# Patient Record
Sex: Female | Born: 1955
Health system: Southern US, Community
[De-identification: ages and names within clinical notes are randomized; demographics above are authoritative.]

## PROBLEM LIST (undated history)

## (undated) DIAGNOSIS — F419 Anxiety disorder, unspecified: Secondary | ICD-10-CM

## (undated) DIAGNOSIS — Z923 Personal history of irradiation: Secondary | ICD-10-CM

## (undated) DIAGNOSIS — IMO0001 Reserved for inherently not codable concepts without codable children: Secondary | ICD-10-CM

## (undated) DIAGNOSIS — Z9221 Personal history of antineoplastic chemotherapy: Secondary | ICD-10-CM

## (undated) DIAGNOSIS — I10 Essential (primary) hypertension: Secondary | ICD-10-CM

## (undated) DIAGNOSIS — R0602 Shortness of breath: Secondary | ICD-10-CM

## (undated) DIAGNOSIS — IMO0002 Reserved for concepts with insufficient information to code with codable children: Secondary | ICD-10-CM

## (undated) DIAGNOSIS — C801 Malignant (primary) neoplasm, unspecified: Secondary | ICD-10-CM

## (undated) DIAGNOSIS — C50919 Malignant neoplasm of unspecified site of unspecified female breast: Secondary | ICD-10-CM

## (undated) DIAGNOSIS — I2699 Other pulmonary embolism without acute cor pulmonale: Secondary | ICD-10-CM

## (undated) DIAGNOSIS — K769 Liver disease, unspecified: Secondary | ICD-10-CM

## (undated) HISTORY — PX: BREAST SURGERY: SHX581

## (undated) HISTORY — DX: Essential (primary) hypertension: I10

## (undated) HISTORY — PX: CHOLECYSTECTOMY: SHX55

## (undated) HISTORY — DX: Reserved for inherently not codable concepts without codable children: IMO0001

## (undated) HISTORY — DX: Reserved for concepts with insufficient information to code with codable children: IMO0002

---

## 1999-02-23 ENCOUNTER — Encounter: Payer: Self-pay | Admitting: Emergency Medicine

## 1999-02-23 ENCOUNTER — Emergency Department (HOSPITAL_COMMUNITY): Admission: EM | Admit: 1999-02-23 | Discharge: 1999-02-23 | Payer: Self-pay | Admitting: Emergency Medicine

## 2003-10-24 ENCOUNTER — Other Ambulatory Visit: Admission: RE | Admit: 2003-10-24 | Discharge: 2003-10-24 | Payer: Self-pay | Admitting: Family Medicine

## 2003-10-24 ENCOUNTER — Encounter: Admission: RE | Admit: 2003-10-24 | Discharge: 2003-10-24 | Payer: Self-pay | Admitting: Family Medicine

## 2003-10-31 ENCOUNTER — Encounter: Admission: RE | Admit: 2003-10-31 | Discharge: 2003-10-31 | Payer: Self-pay | Admitting: Family Medicine

## 2003-11-16 ENCOUNTER — Encounter: Admission: RE | Admit: 2003-11-16 | Discharge: 2003-11-16 | Payer: Self-pay | Admitting: Family Medicine

## 2003-12-01 ENCOUNTER — Encounter: Admission: RE | Admit: 2003-12-01 | Discharge: 2003-12-01 | Payer: Self-pay | Admitting: Sports Medicine

## 2003-12-04 ENCOUNTER — Encounter: Admission: RE | Admit: 2003-12-04 | Discharge: 2003-12-04 | Payer: Self-pay | Admitting: Family Medicine

## 2003-12-07 ENCOUNTER — Encounter (INDEPENDENT_AMBULATORY_CARE_PROVIDER_SITE_OTHER): Payer: Self-pay | Admitting: *Deleted

## 2003-12-07 ENCOUNTER — Encounter: Admission: RE | Admit: 2003-12-07 | Discharge: 2003-12-07 | Payer: Self-pay | Admitting: Sports Medicine

## 2004-01-02 ENCOUNTER — Encounter: Admission: RE | Admit: 2004-01-02 | Discharge: 2004-01-02 | Payer: Self-pay | Admitting: Family Medicine

## 2004-08-20 ENCOUNTER — Emergency Department (HOSPITAL_COMMUNITY): Admission: EM | Admit: 2004-08-20 | Discharge: 2004-08-20 | Payer: Self-pay | Admitting: Emergency Medicine

## 2004-09-15 HISTORY — PX: BREAST LUMPECTOMY: SHX2

## 2005-05-02 ENCOUNTER — Encounter: Admission: RE | Admit: 2005-05-02 | Discharge: 2005-05-02 | Payer: Self-pay | Admitting: Sports Medicine

## 2005-05-02 ENCOUNTER — Encounter (INDEPENDENT_AMBULATORY_CARE_PROVIDER_SITE_OTHER): Payer: Self-pay | Admitting: *Deleted

## 2005-05-16 ENCOUNTER — Encounter: Admission: RE | Admit: 2005-05-16 | Discharge: 2005-05-16 | Payer: Self-pay | Admitting: Sports Medicine

## 2005-05-22 ENCOUNTER — Encounter: Admission: RE | Admit: 2005-05-22 | Discharge: 2005-05-22 | Payer: Self-pay | Admitting: Sports Medicine

## 2005-05-22 ENCOUNTER — Encounter (INDEPENDENT_AMBULATORY_CARE_PROVIDER_SITE_OTHER): Payer: Self-pay | Admitting: *Deleted

## 2005-06-02 ENCOUNTER — Encounter: Admission: RE | Admit: 2005-06-02 | Discharge: 2005-06-02 | Payer: Self-pay | Admitting: Surgery

## 2005-06-05 ENCOUNTER — Encounter (INDEPENDENT_AMBULATORY_CARE_PROVIDER_SITE_OTHER): Payer: Self-pay | Admitting: Surgery

## 2005-06-05 ENCOUNTER — Ambulatory Visit (HOSPITAL_BASED_OUTPATIENT_CLINIC_OR_DEPARTMENT_OTHER): Admission: RE | Admit: 2005-06-05 | Discharge: 2005-06-05 | Payer: Self-pay | Admitting: Surgery

## 2005-06-05 ENCOUNTER — Ambulatory Visit (HOSPITAL_COMMUNITY): Admission: RE | Admit: 2005-06-05 | Discharge: 2005-06-05 | Payer: Self-pay | Admitting: Surgery

## 2005-06-05 ENCOUNTER — Encounter: Admission: RE | Admit: 2005-06-05 | Discharge: 2005-06-05 | Payer: Self-pay | Admitting: Surgery

## 2005-06-05 ENCOUNTER — Encounter (INDEPENDENT_AMBULATORY_CARE_PROVIDER_SITE_OTHER): Payer: Self-pay | Admitting: *Deleted

## 2005-06-25 ENCOUNTER — Ambulatory Visit: Payer: Self-pay | Admitting: Oncology

## 2005-06-27 ENCOUNTER — Ambulatory Visit: Admission: RE | Admit: 2005-06-27 | Discharge: 2005-07-21 | Payer: Self-pay | Admitting: *Deleted

## 2005-08-12 ENCOUNTER — Ambulatory Visit: Payer: Self-pay | Admitting: Oncology

## 2005-08-14 ENCOUNTER — Ambulatory Visit (HOSPITAL_COMMUNITY): Admission: RE | Admit: 2005-08-14 | Discharge: 2005-08-14 | Payer: Self-pay | Admitting: Surgery

## 2005-08-14 ENCOUNTER — Ambulatory Visit (HOSPITAL_BASED_OUTPATIENT_CLINIC_OR_DEPARTMENT_OTHER): Admission: RE | Admit: 2005-08-14 | Discharge: 2005-08-14 | Payer: Self-pay | Admitting: Surgery

## 2005-08-15 ENCOUNTER — Encounter (INDEPENDENT_AMBULATORY_CARE_PROVIDER_SITE_OTHER): Payer: Self-pay | Admitting: Specialist

## 2008-09-15 HISTORY — PX: BREAST LUMPECTOMY: SHX2

## 2008-09-15 HISTORY — PX: BREAST BIOPSY: SHX20

## 2009-07-19 ENCOUNTER — Emergency Department (HOSPITAL_COMMUNITY): Admission: EM | Admit: 2009-07-19 | Discharge: 2009-07-19 | Payer: Self-pay | Admitting: Emergency Medicine

## 2009-08-14 ENCOUNTER — Encounter: Admission: RE | Admit: 2009-08-14 | Discharge: 2009-08-14 | Payer: Self-pay | Admitting: Infectious Diseases

## 2009-08-15 ENCOUNTER — Encounter: Admission: RE | Admit: 2009-08-15 | Discharge: 2009-08-15 | Payer: Self-pay | Admitting: Infectious Diseases

## 2009-08-21 ENCOUNTER — Encounter: Admission: RE | Admit: 2009-08-21 | Discharge: 2009-08-21 | Payer: Self-pay | Admitting: Infectious Diseases

## 2009-08-27 ENCOUNTER — Ambulatory Visit: Payer: Self-pay | Admitting: Oncology

## 2009-09-04 ENCOUNTER — Ambulatory Visit: Admission: RE | Admit: 2009-09-04 | Discharge: 2009-09-14 | Payer: Self-pay | Admitting: Radiation Oncology

## 2009-09-16 ENCOUNTER — Ambulatory Visit: Admission: RE | Admit: 2009-09-16 | Discharge: 2009-11-28 | Payer: Self-pay | Admitting: Radiation Oncology

## 2009-10-26 ENCOUNTER — Ambulatory Visit (HOSPITAL_COMMUNITY): Admission: RE | Admit: 2009-10-26 | Discharge: 2009-10-26 | Payer: Self-pay | Admitting: Surgery

## 2009-11-04 ENCOUNTER — Emergency Department (HOSPITAL_COMMUNITY): Admission: EM | Admit: 2009-11-04 | Discharge: 2009-11-04 | Payer: Self-pay | Admitting: Emergency Medicine

## 2009-12-19 ENCOUNTER — Ambulatory Visit (HOSPITAL_COMMUNITY): Admission: RE | Admit: 2009-12-19 | Discharge: 2009-12-19 | Payer: Self-pay | Admitting: Surgery

## 2009-12-26 ENCOUNTER — Ambulatory Visit: Admission: RE | Admit: 2009-12-26 | Discharge: 2010-03-12 | Payer: Self-pay | Admitting: Radiation Oncology

## 2010-01-01 ENCOUNTER — Ambulatory Visit: Payer: Self-pay | Admitting: Oncology

## 2010-01-03 LAB — CBC WITH DIFFERENTIAL/PLATELET
Basophils Absolute: 0 10*3/uL (ref 0.0–0.1)
HCT: 41.1 % (ref 34.8–46.6)
HGB: 13.8 g/dL (ref 11.6–15.9)
MCH: 33 pg (ref 25.1–34.0)
MCHC: 33.6 g/dL (ref 31.5–36.0)
MCV: 98.4 fL (ref 79.5–101.0)
MONO%: 4.8 % (ref 0.0–14.0)
NEUT#: 6.6 10*3/uL — ABNORMAL HIGH (ref 1.5–6.5)
Platelets: 306 10*3/uL (ref 145–400)
RBC: 4.18 10*6/uL (ref 3.70–5.45)
WBC: 10.8 10*3/uL — ABNORMAL HIGH (ref 3.9–10.3)
lymph#: 3.4 10*3/uL — ABNORMAL HIGH (ref 0.9–3.3)

## 2010-01-04 LAB — COMPREHENSIVE METABOLIC PANEL
ALT: 59 U/L — ABNORMAL HIGH (ref 0–35)
Albumin: 3.9 g/dL (ref 3.5–5.2)
Alkaline Phosphatase: 39 U/L (ref 39–117)
BUN: 11 mg/dL (ref 6–23)
CO2: 30 mEq/L (ref 19–32)
Chloride: 101 mEq/L (ref 96–112)
Creatinine, Ser: 0.85 mg/dL (ref 0.40–1.20)
Glucose, Bld: 101 mg/dL — ABNORMAL HIGH (ref 70–99)
Sodium: 138 mEq/L (ref 135–145)
Total Bilirubin: 0.5 mg/dL (ref 0.3–1.2)

## 2010-01-04 LAB — LACTATE DEHYDROGENASE: LDH: 147 U/L (ref 94–250)

## 2010-01-04 LAB — FOLLICLE STIMULATING HORMONE: FSH: 37.6 m[IU]/mL

## 2010-01-14 LAB — ESTRADIOL, ULTRA SENS: Estradiol, Ultra Sensitive: 3 pg/mL

## 2010-03-20 ENCOUNTER — Ambulatory Visit: Payer: Self-pay | Admitting: Oncology

## 2010-08-13 ENCOUNTER — Ambulatory Visit: Payer: Self-pay | Admitting: Oncology

## 2010-08-15 ENCOUNTER — Encounter
Admission: RE | Admit: 2010-08-15 | Discharge: 2010-08-15 | Payer: Self-pay | Source: Home / Self Care | Admitting: Oncology

## 2010-08-15 LAB — CBC WITH DIFFERENTIAL/PLATELET
BASO%: 0.6 % (ref 0.0–2.0)
Basophils Absolute: 0.1 10*3/uL (ref 0.0–0.1)
EOS%: 0.7 % (ref 0.0–7.0)
HGB: 14.1 g/dL (ref 11.6–15.9)
MCH: 32.6 pg (ref 25.1–34.0)
MCHC: 34.7 g/dL (ref 31.5–36.0)
RBC: 4.33 10*6/uL (ref 3.70–5.45)
RDW: 13.8 % (ref 11.2–14.5)
lymph#: 3.5 10*3/uL — ABNORMAL HIGH (ref 0.9–3.3)

## 2010-08-15 LAB — COMPREHENSIVE METABOLIC PANEL
ALT: 78 U/L — ABNORMAL HIGH (ref 0–35)
AST: 117 U/L — ABNORMAL HIGH (ref 0–37)
Albumin: 3.5 g/dL (ref 3.5–5.2)
Alkaline Phosphatase: 35 U/L — ABNORMAL LOW (ref 39–117)
Calcium: 9.7 mg/dL (ref 8.4–10.5)
Chloride: 101 mEq/L (ref 96–112)
Potassium: 3.8 mEq/L (ref 3.5–5.3)
Sodium: 141 mEq/L (ref 135–145)

## 2010-09-19 ENCOUNTER — Ambulatory Visit: Payer: Self-pay | Admitting: Oncology

## 2010-10-06 ENCOUNTER — Encounter: Payer: Self-pay | Admitting: Oncology

## 2010-10-11 ENCOUNTER — Ambulatory Visit (HOSPITAL_COMMUNITY)
Admission: RE | Admit: 2010-10-11 | Discharge: 2010-10-11 | Payer: Self-pay | Source: Home / Self Care | Attending: Oncology | Admitting: Oncology

## 2010-10-15 ENCOUNTER — Ambulatory Visit (HOSPITAL_COMMUNITY)
Admission: RE | Admit: 2010-10-15 | Discharge: 2010-10-15 | Payer: Self-pay | Source: Home / Self Care | Attending: Oncology | Admitting: Oncology

## 2010-10-16 ENCOUNTER — Encounter: Payer: Self-pay | Admitting: Oncology

## 2010-10-17 ENCOUNTER — Ambulatory Visit: Payer: Self-pay | Attending: Radiation Oncology | Admitting: Radiation Oncology

## 2010-10-17 ENCOUNTER — Ambulatory Visit: Payer: Self-pay | Admitting: Radiation Oncology

## 2010-12-04 LAB — BASIC METABOLIC PANEL
BUN: 7 mg/dL (ref 6–23)
BUN: 6 mg/dL (ref 6–23)
CO2: 26 mEq/L (ref 19–32)
CO2: 30 mEq/L (ref 19–32)
Calcium: 8.6 mg/dL (ref 8.4–10.5)
Chloride: 99 mEq/L (ref 96–112)
Creatinine, Ser: 0.85 mg/dL (ref 0.4–1.2)
Creatinine, Ser: 0.94 mg/dL (ref 0.4–1.2)
GFR calc Af Amer: >60 mL/min (ref >60)
Potassium: 3.3 mEq/L — ABNORMAL LOW (ref 3.5–5.1)

## 2010-12-04 LAB — CBC
HCT: 40.9 % (ref 36.0–46.0)
MCHC: 34.8 g/dL (ref 30.0–36.0)
MCV: 98.4 fL (ref 78.0–100.0)
Platelets: 310 10*3/uL (ref 150–400)
RBC: 4.15 MIL/uL (ref 3.87–5.11)

## 2010-12-06 LAB — BASIC METABOLIC PANEL
BUN: 12 mg/dL (ref 6–23)
CO2: 31 mEq/L (ref 19–32)
Calcium: 9.2 mg/dL (ref 8.4–10.5)
Chloride: 102 mEq/L (ref 96–112)
Creatinine, Ser: 0.76 mg/dL (ref 0.4–1.2)
Glucose, Bld: 105 mg/dL — ABNORMAL HIGH (ref 70–99)

## 2010-12-06 LAB — DIFFERENTIAL
Basophils Absolute: 0 10*3/uL (ref 0.0–0.1)
Basophils Relative: 0 % (ref 0–1)
Eosinophils Absolute: 0 10*3/uL (ref 0.0–0.7)
Monocytes Relative: 6 % (ref 3–12)
Neutro Abs: 7.3 10*3/uL (ref 1.7–7.7)
Neutrophils Relative %: 66 % (ref 43–77)

## 2010-12-06 LAB — URINE MICROSCOPIC-ADD ON

## 2010-12-06 LAB — URINALYSIS, ROUTINE W REFLEX MICROSCOPIC
Glucose, UA: NEGATIVE mg/dL
Ketones, ur: NEGATIVE mg/dL
Protein, ur: NEGATIVE mg/dL
Urobilinogen, UA: 0.2 mg/dL (ref 0.0–1.0)

## 2010-12-06 LAB — RAPID URINE DRUG SCREEN, HOSP PERFORMED
Amphetamines: NOT DETECTED
Barbiturates: NOT DETECTED
Benzodiazepines: NOT DETECTED
Cocaine: NOT DETECTED
Opiates: NOT DETECTED

## 2010-12-06 LAB — CBC
MCHC: 34.4 g/dL (ref 30.0–36.0)
MCV: 97.2 fL (ref 78.0–100.0)
RBC: 4.1 MIL/uL (ref 3.87–5.11)
RDW: 15.1 % (ref 11.5–15.5)

## 2010-12-18 LAB — POCT I-STAT, CHEM 8
Creatinine, Ser: 0.8 mg/dL (ref 0.4–1.2)
Glucose, Bld: 78 mg/dL (ref 70–99)
HCT: 45 % (ref 36.0–46.0)
Hemoglobin: 15.3 g/dL — ABNORMAL HIGH (ref 12.0–15.0)
Potassium: 3.3 mEq/L — ABNORMAL LOW (ref 3.5–5.1)
Sodium: 141 mEq/L (ref 135–145)
TCO2: 23 mmol/L (ref 0–100)

## 2011-01-31 NOTE — Op Note (Signed)
NAME:  Veronica Reyes, Veronica Reyes    ACCOUNT NO.:  [id number]   MEDICAL RECORD NO.:  [id number]          PATIENT TYPE:  AMB   LOCATION:  DSC                          FACILITY:  [facility]   PHYSICIAN:  Currie Paris, M.D.DATE OF BIRTH:  03/08/1956   DATE OF PROCEDURE:  08/14/2005  DATE OF DISCHARGE:                                 OPERATIVE REPORT   [id number].   PREOPERATIVE DIAGNOSIS:  Ductal carcinoma in situ, left breast, with close  margin.   POSTOPERATIVE DIAGNOSIS:  Ductal carcinoma in situ, left breast, with close  margin.   OPERATION:  Re-excision of left lumpectomy site superior medial deep margin.   SURGEON:  Currie Paris, M.D.   ANESTHESIA:  General.   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  For the deeper, more  medially-located one I had taken some extra margin and a small amount of  DCIS outside of these two more well-circumscribed masses was found.  The  patient has very large breasts and we discussed mastectomy, but that would  make her very asymmetric and she was not in a position to see about having a  reduction done on the contralateral side.  We were also concerned about side  effects of radiation should we simply radiate this area, and it was elected  to go back and re-excise the margin and see if we could get a better margin.  The process of making these decisions in and getting evaluations has taken  about two months.  She had initially healing well but had presented  apparently with a late-developing wound hematoma and had that drained in the  office but had not appeared to be infected.  At this point we decided bring  her back to the operating room and try to re-excise her margin.   DESCRIPTION OF PROCEDURE:  The patient was seen in the holding area and had  no further questions.  The left breast was identified and marked as the  operative side by the patient  myself.  As noted, there was a drain present.   The patient was taken to the operating room and after satisfactory general  anesthesia had been obtained, the drain was removed and the left breast was  prepped and draped.  The old scar was opened and extended a little bit  medially since the lumpectomy had really tracked from the incision more  medially than was apparent.  The lumpectomy cavity was fairly indurated  around it and I think there is a lot of inflammatory process, but there was  nothing that looked infected or purulent.  There was just some residual old  hematoma-looking material in the cavity itself.   Using cutting current of the cautery, I re-excised the deep, the medial and  the medial half of the superior margins, which were the area where I had  previously excised some extra margin.  This was all sent to pathology.  I  spent several minutes irrigating and making sure everything was completely  dry.  The skin was fairly edematous from her inflammatory process and  reaction to  the prior surgery and I elected to place another drain back in,  so this was done  through a new stab wound using a 19 Blake and securing that with a 2-0  nylon.  Once everything appeared to be dry, I only had just simply closed  the skin with some staples.   The patient tolerated the procedure well. There were no complications, and  all counts were correct.      Currie Paris, M.D.  Electronically Signed     CJS/MEDQ  D:  08/14/2005  T:  08/15/2005  Job:  [id number]   cc:   Elmer Sow. Dorna Bloom, M.D.  Fax: [phone]

## 2011-01-31 NOTE — Op Note (Signed)
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   OPERATION:  Needle-guided excision of 2 breast masses, left breast.   SURGEON:  Currie Paris, M.D.   ANESTHESIA:  General.   CLINICAL HISTORY:  This lady had a palpable mass and the left breast and a  core biopsy showed what was worrisome for some intracystic DCIS.  In further  evaluation, a second, deeper, more medial mass was found and this was  biopsied and shown to be a papilloma.  It was felt wide excision of both  these masses was appropriate and since they were close together, I thought  we could do this under a single incision.   DESCRIPTION OF PROCEDURE:  The patient was seen in the holding area.  We had  localized both lesions with guidewires to be sure that we were well around  everything and especially since the more palpable lesion had become less  readily noticeable after her biopsy.  It had been filled with some fluid  which diminish to its apparent size.   After the films were reviewed, we confirmed with the patient the plans for  the excisional biopsies on the left side and identified and marked that  side.   The patient was then taken to the operating room and after satisfactory  general anesthesia had been obtained, the left breast was prepped and  draped.  The radiologist placed the guidewires  laterally and they both  tracked medially and were fairly adjacent to each other.  There were 2  X's  on the skin to mark the abnormalities, one just below the other.   I made a curvilinear incision directly between the 2 guidewires and went  fairly far medial, since that was the way the guidewires tracked.  I divided  a little fatty tissue and then using cautery, took a wide excision around  both guidewires, going first superior and since I could palpate the one  lesion, I made sure I was well above it and went down below it and then  worked around medially.  I then worked somewhat inferiorly to free that up  and then worked from lateral to medial.  When I was well-medial to the mark,  I tried to divide down through the tissue and then I encountered the second  mass, which was a soft papillomatous-looking lesion. I went ahead and took  more tissue right at this point to get around it completely and then  completed the lumpectomy.   Prior to sending the specimen, I put some markers on it  to orient it for the  pathologist.  I also closed the area where I had gotten into the papilloma  to make the true margins a little bit more apparent.   At this point I went back and made sure everything was dry.  I then took  additional tissue from the medial superior deep area, which is where the  papilloma had been located and where I had gotten into it to make sure I was  well around it.  There was also little bit of soft-looking breast tissue  mixed in with fatty that I took out to be sure that this was not the edge of  the papilloma, making sure that I had one side that was well beyond and the  grossly looked abnormal.  Again, I spent several minutes making sure  everything was dry and once I thought we had good hemostasis, I irrigated  and then closed with some 3-0 Vicryl interrupted followed by for 4-0  Monocryl and Dermabond.   Pathologist reported that both lesions were contained within the  specimen  mammogram.  This concluded the case.  The patient was awakened and taken to  the recovery room in satisfactory condition.  There no operative  complications.  All counts were correct.      Currie Paris, M.D.  Electronically Signed     CJS/MEDQ  D:  06/05/2005  T:  06/06/2005  Job:  [id number]

## 2011-04-03 ENCOUNTER — Other Ambulatory Visit: Payer: Self-pay | Admitting: Oncology

## 2011-04-03 ENCOUNTER — Encounter (HOSPITAL_BASED_OUTPATIENT_CLINIC_OR_DEPARTMENT_OTHER): Payer: Self-pay | Admitting: Oncology

## 2011-04-03 DIAGNOSIS — C50419 Malignant neoplasm of upper-outer quadrant of unspecified female breast: Secondary | ICD-10-CM

## 2011-04-03 LAB — CBC WITH DIFFERENTIAL/PLATELET
EOS%: 1.2 % (ref 0.0–7.0)
MCH: 32.2 pg (ref 25.1–34.0)
MCV: 95.2 fL (ref 79.5–101.0)
MONO%: 7.5 % (ref 0.0–14.0)
NEUT#: 4.5 10*3/uL (ref 1.5–6.5)
RBC: 4.14 10*6/uL (ref 3.70–5.45)
RDW: 14.2 % (ref 11.2–14.5)
lymph#: 3.6 10*3/uL — ABNORMAL HIGH (ref 0.9–3.3)

## 2011-04-03 LAB — COMPREHENSIVE METABOLIC PANEL
ALT: 54 U/L — ABNORMAL HIGH (ref 0–35)
AST: 67 U/L — ABNORMAL HIGH (ref 0–37)
Albumin: 3.1 g/dL — ABNORMAL LOW (ref 3.5–5.2)
Alkaline Phosphatase: 34 U/L — ABNORMAL LOW (ref 39–117)
Calcium: 9.9 mg/dL (ref 8.4–10.5)
Chloride: 103 mEq/L (ref 96–112)
Potassium: 3.7 mEq/L (ref 3.5–5.3)
Sodium: 139 mEq/L (ref 135–145)
Total Protein: 7.9 g/dL (ref 6.0–8.3)

## 2011-04-04 ENCOUNTER — Other Ambulatory Visit: Payer: Self-pay | Admitting: Oncology

## 2011-04-04 DIAGNOSIS — Z9889 Other specified postprocedural states: Secondary | ICD-10-CM

## 2011-09-29 ENCOUNTER — Other Ambulatory Visit: Payer: Self-pay | Admitting: Obstetrics and Gynecology

## 2011-09-29 DIAGNOSIS — Z1231 Encounter for screening mammogram for malignant neoplasm of breast: Secondary | ICD-10-CM

## 2011-09-30 ENCOUNTER — Ambulatory Visit (HOSPITAL_COMMUNITY): Payer: Self-pay | Attending: Obstetrics and Gynecology

## 2011-09-30 ENCOUNTER — Other Ambulatory Visit: Payer: Self-pay | Admitting: Obstetrics and Gynecology

## 2011-09-30 ENCOUNTER — Ambulatory Visit (INDEPENDENT_AMBULATORY_CARE_PROVIDER_SITE_OTHER): Payer: Self-pay | Admitting: *Deleted

## 2011-09-30 VITALS — BP 183/111 | HR 73 | Temp 99.5°F | Resp 16 | Ht 66.0 in | Wt 307.0 lb

## 2011-09-30 DIAGNOSIS — Z9889 Other specified postprocedural states: Secondary | ICD-10-CM

## 2011-09-30 DIAGNOSIS — Z853 Personal history of malignant neoplasm of breast: Secondary | ICD-10-CM

## 2011-09-30 DIAGNOSIS — Z01419 Encounter for gynecological examination (general) (routine) without abnormal findings: Secondary | ICD-10-CM

## 2011-09-30 NOTE — Patient Instructions (Signed)
Taught patient how to perform BSE and gave educational materials to take home. Let her know [facility] will cover Pap smears every 3 years unless has a history of abnormal Pap smears. Patient is scheduled for a diagnostic mammogram at the [facility] for Thursday, October 09, 2011 at 0900. Patient aware of appointment and if unable to make it will call and reschedule. Patient given phone number to the [facility]. Talked with patient about her high blood pressure. Patient does have a PCP who follows her. Patient stated she will call PCP and schedule a follow up appointment. Patient stated blood pressure has been normal lately when had it taken until today. Let patient know will follow up with her within the next couple weeks with results by letter or phone. Patient verbalized understanding.

## 2011-09-30 NOTE — Progress Notes (Signed)
No complaints today.  Pap Smear:    Completed Pap smear today. Last Pap smear was in 2010 through [facility] at the [facility] and normal per patient. No history of abnormal Pap smears per patient.  No Pap smear results in EPIC.  Physical exam: Breasts Breasts symmetrical. Two scars on left upper breast where patient has had 2 lumpectomy's related to breast cancer in 2005 and 2010. Under bilateral breasts skin is red and yeast like appearing. Patient stated sweats a lot under breast and has been given a prescription to help protect her skin. No nipple retraction bilateral breasts. No nipple discharge bilateral breasts. No lymphadenopathy. No lumps palpated bilateral breasts. No complaints of pain or tenderness bilateral breasts. Per the [facility] recommendation. Referred patient to the [facility] for follow up diagnostic mammogram. Patient's appointment is scheduled for Thursday, October 09, 2011 at 0900.         Pelvic/Bimanual   Ext Genitalia No lesions, no swelling and no discharge observed on external genitalia.         Vagina Vagina pink and normal texture. No lesions or discharge observed in vagina.          Cervix Cervix is present. Cervix pink and of normal texture. No discharge observed at cervical os.         Uterus Uterus is present and palpable. Uterus in normal position and normal size.       Adnexae Bilateral ovaries present and not palpable. No tenderness on palpation.        Rectovaginal No rectal exam completed today since patient had no rectal complaints. No skin abnormalities observed on rectal area.

## 2011-10-15 ENCOUNTER — Ambulatory Visit
Admission: RE | Admit: 2011-10-15 | Discharge: 2011-10-15 | Disposition: A | Discharge status: Home / Self Care | Payer: No Typology Code available for payment source | Source: Ambulatory Visit | Attending: Obstetrics and Gynecology | Admitting: Obstetrics and Gynecology

## 2011-10-15 DIAGNOSIS — Z853 Personal history of malignant neoplasm of breast: Secondary | ICD-10-CM

## 2011-10-15 DIAGNOSIS — Z9889 Other specified postprocedural states: Secondary | ICD-10-CM

## 2011-10-21 ENCOUNTER — Other Ambulatory Visit: Payer: Self-pay

## 2011-10-21 DIAGNOSIS — C50419 Malignant neoplasm of upper-outer quadrant of unspecified female breast: Secondary | ICD-10-CM

## 2011-10-22 ENCOUNTER — Other Ambulatory Visit: Payer: Self-pay | Admitting: Lab

## 2011-10-22 ENCOUNTER — Ambulatory Visit: Payer: Self-pay | Admitting: Oncology

## 2011-10-23 ENCOUNTER — Other Ambulatory Visit: Payer: Self-pay

## 2011-10-24 ENCOUNTER — Telehealth: Payer: Self-pay | Admitting: *Deleted

## 2011-10-24 NOTE — Telephone Encounter (Signed)
Attempted to call patient to follow up on mammogram and Pap smear results. No one answered phone. Left voicemail for patient to call me back.

## 2011-11-06 ENCOUNTER — Telehealth: Payer: Self-pay | Admitting: *Deleted

## 2011-11-06 NOTE — Telephone Encounter (Signed)
Called patient and gave her Pap smear results. Let her know that her Pap smear was normal. Patient has received results from the [facility] and aware she will need a diagnostic mammogram in 1 year. Let patient know that if still eligible  for [facility] in 1 year that she can call and set up her appointment with [place]. Patient verbalized understanding.

## 2013-12-30 ENCOUNTER — Other Ambulatory Visit: Payer: Self-pay | Admitting: Nurse Practitioner

## 2013-12-30 DIAGNOSIS — Z853 Personal history of malignant neoplasm of breast: Secondary | ICD-10-CM

## 2014-01-02 ENCOUNTER — Other Ambulatory Visit: Payer: Self-pay | Admitting: Gastroenterology

## 2014-01-02 DIAGNOSIS — R933 Abnormal findings on diagnostic imaging of other parts of digestive tract: Secondary | ICD-10-CM

## 2014-01-06 ENCOUNTER — Ambulatory Visit
Admission: RE | Admit: 2014-01-06 | Discharge: 2014-01-06 | Disposition: A | Discharge status: Home / Self Care | Payer: BC Managed Care – PPO | Source: Ambulatory Visit | Attending: Gastroenterology | Admitting: Gastroenterology

## 2014-01-06 ENCOUNTER — Encounter (HOSPITAL_COMMUNITY): Payer: Self-pay | Admitting: Emergency Medicine

## 2014-01-06 ENCOUNTER — Inpatient Hospital Stay (HOSPITAL_COMMUNITY)
Admission: EM | Admit: 2014-01-06 | Discharge: 2014-01-08 | DRG: 176 | Disposition: A | Discharge status: Home / Self Care | Payer: BC Managed Care – PPO | Attending: Internal Medicine | Admitting: Internal Medicine

## 2014-01-06 DIAGNOSIS — C189 Malignant neoplasm of colon, unspecified: Secondary | ICD-10-CM | POA: Diagnosis present

## 2014-01-06 DIAGNOSIS — K639 Disease of intestine, unspecified: Secondary | ICD-10-CM | POA: Diagnosis present

## 2014-01-06 DIAGNOSIS — R933 Abnormal findings on diagnostic imaging of other parts of digestive tract: Secondary | ICD-10-CM

## 2014-01-06 DIAGNOSIS — K922 Gastrointestinal hemorrhage, unspecified: Secondary | ICD-10-CM | POA: Diagnosis present

## 2014-01-06 DIAGNOSIS — K625 Hemorrhage of anus and rectum: Secondary | ICD-10-CM | POA: Diagnosis present

## 2014-01-06 DIAGNOSIS — Z8249 Family history of ischemic heart disease and other diseases of the circulatory system: Secondary | ICD-10-CM

## 2014-01-06 DIAGNOSIS — C50919 Malignant neoplasm of unspecified site of unspecified female breast: Secondary | ICD-10-CM

## 2014-01-06 DIAGNOSIS — K7689 Other specified diseases of liver: Secondary | ICD-10-CM | POA: Diagnosis present

## 2014-01-06 DIAGNOSIS — I10 Essential (primary) hypertension: Secondary | ICD-10-CM | POA: Diagnosis present

## 2014-01-06 DIAGNOSIS — K921 Melena: Secondary | ICD-10-CM | POA: Diagnosis present

## 2014-01-06 DIAGNOSIS — I2699 Other pulmonary embolism without acute cor pulmonale: Principal | ICD-10-CM | POA: Diagnosis present

## 2014-01-06 DIAGNOSIS — K769 Liver disease, unspecified: Secondary | ICD-10-CM

## 2014-01-06 DIAGNOSIS — K6389 Other specified diseases of intestine: Secondary | ICD-10-CM

## 2014-01-06 DIAGNOSIS — Z853 Personal history of malignant neoplasm of breast: Secondary | ICD-10-CM

## 2014-01-06 DIAGNOSIS — C50511 Malignant neoplasm of lower-outer quadrant of right female breast: Secondary | ICD-10-CM | POA: Diagnosis present

## 2014-01-06 HISTORY — DX: Malignant (primary) neoplasm, unspecified: C80.1

## 2014-01-06 HISTORY — DX: Malignant neoplasm of unspecified site of unspecified female breast: C50.919

## 2014-01-06 LAB — CBC WITH DIFFERENTIAL/PLATELET
BASOS ABS: 0 10*3/uL (ref 0.0–0.1)
BASOS PCT: 0 % (ref 0–1)
Eosinophils Absolute: 0.1 10*3/uL (ref 0.0–0.7)
Eosinophils Relative: 1 % (ref 0–5)
HEMATOCRIT: 39.3 % (ref 36.0–46.0)
Hemoglobin: 13.5 g/dL (ref 12.0–15.0)
Lymphocytes Relative: 35 % (ref 12–46)
Lymphs Abs: 4.2 10*3/uL — ABNORMAL HIGH (ref 0.7–4.0)
MCH: 31.4 pg (ref 26.0–34.0)
MCHC: 34.4 g/dL (ref 30.0–36.0)
MCV: 91.4 fL (ref 78.0–100.0)
MONO ABS: 0.6 10*3/uL (ref 0.1–1.0)
Monocytes Relative: 5 % (ref 3–12)
NEUTROS ABS: 6.9 10*3/uL (ref 1.7–7.7)
NEUTROS PCT: 59 % (ref 43–77)
PLATELETS: 272 10*3/uL (ref 150–400)
RBC: 4.3 MIL/uL (ref 3.87–5.11)
RDW: 14.4 % (ref 11.5–15.5)
WBC: 11.9 10*3/uL — AB (ref 4.0–10.5)

## 2014-01-06 LAB — COMPREHENSIVE METABOLIC PANEL
ALBUMIN: 3.2 g/dL — AB (ref 3.5–5.2)
ALT: 36 U/L — AB (ref 0–35)
AST: 41 U/L — AB (ref 0–37)
Alkaline Phosphatase: 43 U/L (ref 39–117)
BILIRUBIN TOTAL: 0.5 mg/dL (ref 0.3–1.2)
BUN: 7 mg/dL (ref 6–23)
CHLORIDE: 102 meq/L (ref 96–112)
CO2: 22 mEq/L (ref 19–32)
CREATININE: 0.8 mg/dL (ref 0.50–1.10)
Calcium: 9.8 mg/dL (ref 8.4–10.5)
GFR calc Af Amer: >90 mL/min (ref >90)
GFR calc non Af Amer: 81 mL/min — ABNORMAL LOW (ref >90)
Glucose, Bld: 90 mg/dL (ref 70–99)
Potassium: 3.5 mEq/L — ABNORMAL LOW (ref 3.7–5.3)
SODIUM: 139 meq/L (ref 137–147)
Total Protein: 8 g/dL (ref 6.0–8.3)

## 2014-01-06 LAB — PROTIME-INR
INR: 1.01 (ref 0.00–1.49)
Prothrombin Time: 13.1 seconds (ref 11.6–15.2)

## 2014-01-06 LAB — APTT: APTT: 29 s (ref 24–37)

## 2014-01-06 MED ORDER — HEPARIN (PORCINE) IN NACL 100-0.45 UNIT/ML-% IJ SOLN
2200.0000 [IU]/h | INTRAMUSCULAR | Status: DC
  Administered 2014-01-06: 1650 [IU]/h via INTRAVENOUS
  Filled 2014-01-06 (×2): qty 250

## 2014-01-06 MED ORDER — HEPARIN BOLUS VIA INFUSION
4000.0000 [IU] | Freq: Once | INTRAVENOUS | Status: AC
  Administered 2014-01-06: 4000 [IU] via INTRAVENOUS
  Filled 2014-01-06: qty 4000

## 2014-01-06 MED ORDER — IOHEXOL 300 MG/ML  SOLN
125.0000 mL | Freq: Once | INTRAMUSCULAR | Status: AC | PRN
  Administered 2014-01-06: 125 mL via INTRAVENOUS

## 2014-01-06 NOTE — ED Notes (Signed)
Pt reports was told by [facility] imaging that she had a "Clot in my lungs and I should come right to the emergency room". Dr. Benson Norway sent the patient.

## 2014-01-06 NOTE — Progress Notes (Signed)
ANTICOAGULATION CONSULT NOTE - Initial Consult  Pharmacy Consult for heparin Indication: pulmonary embolus  No Known Allergies  Patient Measurements: Height: 5\' 6"  (167.6 cm) Weight: 293 lb (132.904 kg) IBW/kg (Calculated) : 59.3 Heparin Dosing Weight: 92kg  Vital Signs: Temp: 98.4 F (36.9 C) (04/24 1623) Temp src: Oral (04/24 1623) BP: 119/71 mmHg (04/24 2030) Pulse Rate: 78 (04/24 2030)  Labs:  Recent Labs  01/06/14 2018  HGB 13.5  HCT 39.3  PLT 272  LABPROT 13.1  INR 1.01  CREATININE 0.80    Estimated Creatinine Clearance: 110 ml/min (by C-G formula based on Cr of 0.8).   Medical History: Past Medical History  Diagnosis Date  . Hypertension   . Cancer   . Breast CA     Medications:   (Not in a hospital admission) Scheduled:   Assessment: 58 yo who recently had a colonoscopy. She had a CT done today she was positive for a PE. She was advised to come to the ED for treatment. IV heparin has been ordered for anticoagulation.  Goal of Therapy:  Heparin level 0.3-0.7 units/ml Monitor platelets by anticoagulation protocol: Yes   Plan:   Heparin bolus 4000 units x1 Heparin drip at 1600 units/hr F/u with 6h heparin level Daily level and CBC

## 2014-01-06 NOTE — ED Provider Notes (Signed)
CSN: [id number]     Arrival date & time 01/06/14  1620 History   First MD Initiated Contact with Patient 01/06/14 1959     Chief Complaint  Patient presents with  . abnormal c-t      (Consider location/radiation/quality/duration/timing/severity/associated sxs/prior Treatment) HPI Comments: Patient with a history of breast cancer, treated with lumpectomy in 2005 at 2011 recently has had blood in her stool.  She had a colonoscopy performed by Dr. Almyra Free that showed she had a cancerous polyp.  She is due for her.  A consultation with [facility] on Monday. Dr., ordered a CT scan of her chest and abdomen.  Prior to surgery.  Do, to her history and unfortunately, it shows, that she's got a pulmonary embolus, and many small lytic lesions. Patient denies any shortness of breath or chest pain, but states that she has not had any energy for several years  The history is provided by the patient.    Past Medical History  Diagnosis Date  . Hypertension   . Cancer   . Breast CA    Past Surgical History  Procedure Laterality Date  . Cesarean section     Family History  Problem Relation Age of Onset  . Hypertension Sister    History  Substance Use Topics  . Smoking status: Never Smoker   . Smokeless tobacco: Not on file  . Alcohol Use: No   OB History   Grav Para Term Preterm Abortions TAB SAB Ect Mult Living   1         1     Review of Systems  Constitutional: Negative for fever and chills.  Respiratory: Negative for shortness of breath.   Cardiovascular: Negative for chest pain.  Gastrointestinal: Positive for blood in stool. Negative for nausea, vomiting and abdominal pain.  Neurological: Negative for dizziness and headaches.  All other systems reviewed and are negative.     Allergies  Review of patient's allergies indicates no known allergies.  Home Medications   Prior to Admission medications   Medication Sig Start Date End Date Taking? Authorizing Provider   hydrochlorothiazide (HYDRODIURIL) 25 MG tablet Take 25 mg by mouth every morning.    Yes Historical Provider, MD  Ibuprofen-Diphenhydramine Cit (ADVIL PM PO) Take 2 tablets by mouth at bedtime as needed (sleep).   Yes Historical Provider, MD  metoprolol (LOPRESSOR) 50 MG tablet Take 50 mg by mouth 2 (two) times daily.   Yes Historical Provider, MD  POTASSIUM PO Take 1 tablet by mouth 2 (two) times daily.   Yes Historical Provider, MD   BP 160/93  Pulse 79  Temp(Src) 98.4 F (36.9 C) (Oral)  Resp 16  Ht 5\' 6"  (1.676 m)  Wt 293 lb (132.904 kg)  BMI 47.31 kg/m2  SpO2 97% Physical Exam  Nursing note and vitals reviewed. Constitutional: She is oriented to person, place, and time. She appears well-developed and well-nourished.  HENT:  Head: Normocephalic.  Eyes: Pupils are equal, round, and reactive to light.  Neck: Normal range of motion.  Cardiovascular: Normal rate and regular rhythm.   Pulmonary/Chest: Effort normal and breath sounds normal. No respiratory distress. She has no wheezes. She exhibits no tenderness.  Abdominal: Soft. Bowel sounds are normal. She exhibits no distension. There is no tenderness.  Musculoskeletal: Normal range of motion.  Neurological: She is alert and oriented to person, place, and time.  Skin: Skin is warm.    ED Course  Procedures (including critical care time) Labs Review  Labs Reviewed  CBC WITH DIFFERENTIAL - Abnormal; Notable for the following:    WBC 11.9 (*)    Lymphs Abs 4.2 (*)    All other components within normal limits  COMPREHENSIVE METABOLIC PANEL - Abnormal; Notable for the following:    Potassium 3.5 (*)    Albumin 3.2 (*)    AST 41 (*)    ALT 36 (*)    GFR calc non Af Amer 81 (*)    All other components within normal limits  PROTIME-INR  APTT  HEPARIN LEVEL (UNFRACTIONATED)  CBC    Imaging Review Ct Chest W Contrast  01/06/2014   CLINICAL DATA:  Initial diagnosis colon cancer. Colonoscopy 2 weeks prior. Blood in stool.  Prior history of left breast cancer.  EXAM: CT CHEST, ABDOMEN, AND PELVIS WITH CONTRAST  TECHNIQUE: Multidetector CT imaging of the chest, abdomen and pelvis was performed following the standard protocol during bolus administration of intravenous contrast.  CONTRAST:  124mL OMNIPAQUE IOHEXOL 300 MG/ML  SOLN  COMPARISON:  None.  FINDINGS: CT CHEST FINDINGS  No axillary or supraclavicular lymphadenopathy. No mediastinal hilar lymphadenopathy. No pericardial fluid. Esophagus normal.  No central pulmonary embolism. There are several small tubular filling defect within the left lower lobe pulmonary artery extent the lower lobe lower lobes segments and the superior segment of the little lower lobe. Small filling defect within the left upper lobe additionally (image 18.  Review of the lung windows demonstrates no pulmonary nodules. No evidence of pulmonary infarction. There is mild atelectasis at left lung base  There is a small low-density lesion in the medial left hepatic lobe measuring 5 mm (segment 4A). Post cholecystectomy. The pancreas, spleen, adrenal glands are normal. There are bilateral simple fluid density renal cysts.  The stomach, small bowel, appendix, and cecum are normal. The colon demonstrates an ill-defined mass at the junction of the descending colon and sigmoid colon. This ill-defined mass involves approximately 6 cm segment of the colon best seen on coronal image 59. There is no evidence of obstruction proximal to this mass. There is a stool in the sigmoid colon and rectum. There is a abnormal lymph node along the mesenteric border of the descending colon adjacent to the mass measuring 15 mm (image 80, series 3).  The abdominal aorta is normal in caliber. No retroperitoneal periportal lymphadenopathy.  No free fluid the pelvis. The uterus and ovaries by. The bladder is normal.  There is sclerosis within the posterior aspect of the right iliac bone (image 92, series 3.  CT ABDOMEN AND PELVIS FINDINGS   There is a small low-density lesion in the medial left hepatic lobe measuring 5 mm (segment 4A). Post cholecystectomy. The pancreas, spleen, adrenal glands are normal. There are bilateral simple fluid density renal cysts.  The stomach, small bowel, appendix, and cecum are normal. The colon demonstrates an ill-defined mass at the junction of the descending colon and sigmoid colon. This ill-defined mass involves approximately 6 cm segment of the colon best seen on coronal image 59. There is no evidence of obstruction proximal to this mass. There is a stool in the sigmoid colon and rectum. There is a abnormal lymph node along the mesenteric border of the descending colon adjacent to the mass measuring 15 mm (image 80, series 3).  The abdominal aorta is normal in caliber. No retroperitoneal periportal lymphadenopathy. No free fluid the pelvis. The uterus and ovaries, and bladder are normal. There is sclerosis within the posterior aspect of the right iliac bone (  image 92, series 3).  IMPRESSION: 1. Acute pulmonary within the left lower lobe pulmonary arteries. Overall clot burden is mild to moderate. 2. Ill-defined mass within the proximal sigmoid colon / distal descending colon. No evidence of bowel obstruction. 3. 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 4. Tiny indeterminate 5 mm lesion within the central left hepatic lobe. 5. Sclerotic lesion in the posterior aspect of the right iliac bone is also indeterminate but could be evaluated with FDG PET-CT scan.  Critical Value/emergent results were called by telephone at the time of interpretation on 01/06/2014 at 4:02 PM to Dr. Carol Ada , who verbally acknowledged these results.   Electronically Signed   By: Suzy Bouchard M.D.   On: 01/06/2014 16:09   Ct Abdomen Pelvis W Contrast  01/06/2014   CLINICAL DATA:  Initial diagnosis colon cancer. Colonoscopy 2 weeks prior. Blood in stool. Prior history of left breast cancer.   EXAM: CT CHEST, ABDOMEN, AND PELVIS WITH CONTRAST  TECHNIQUE: Multidetector CT imaging of the chest, abdomen and pelvis was performed following the standard protocol during bolus administration of intravenous contrast.  CONTRAST:  155mL OMNIPAQUE IOHEXOL 300 MG/ML  SOLN  COMPARISON:  None.  FINDINGS: CT CHEST FINDINGS  No axillary or supraclavicular lymphadenopathy. No mediastinal hilar lymphadenopathy. No pericardial fluid. Esophagus normal.  No central pulmonary embolism. There are several small tubular filling defect within the left lower lobe pulmonary artery extent the lower lobe lower lobes segments and the superior segment of the little lower lobe. Small filling defect within the left upper lobe additionally (image 18.  Review of the lung windows demonstrates no pulmonary nodules. No evidence of pulmonary infarction. There is mild atelectasis at left lung base  There is a small low-density lesion in the medial left hepatic lobe measuring 5 mm (segment 4A). Post cholecystectomy. The pancreas, spleen, adrenal glands are normal. There are bilateral simple fluid density renal cysts.  The stomach, small bowel, appendix, and cecum are normal. The colon demonstrates an ill-defined mass at the junction of the descending colon and sigmoid colon. This ill-defined mass involves approximately 6 cm segment of the colon best seen on coronal image 59. There is no evidence of obstruction proximal to this mass. There is a stool in the sigmoid colon and rectum. There is a abnormal lymph node along the mesenteric border of the descending colon adjacent to the mass measuring 15 mm (image 80, series 3).  The abdominal aorta is normal in caliber. No retroperitoneal periportal lymphadenopathy.  No free fluid the pelvis. The uterus and ovaries by. The bladder is normal.  There is sclerosis within the posterior aspect of the right iliac bone (image 92, series 3.  CT ABDOMEN AND PELVIS FINDINGS  There is a small low-density lesion in  the medial left hepatic lobe measuring 5 mm (segment 4A). Post cholecystectomy. The pancreas, spleen, adrenal glands are normal. There are bilateral simple fluid density renal cysts.  The stomach, small bowel, appendix, and cecum are normal. The colon demonstrates an ill-defined mass at the junction of the descending colon and sigmoid colon. This ill-defined mass involves approximately 6 cm segment of the colon best seen on coronal image 59. There is no evidence of obstruction proximal to this mass. There is a stool in the sigmoid colon and rectum. There is a abnormal lymph node along the mesenteric border of the descending colon adjacent to the mass measuring 15 mm (image 80, series 3).  The  abdominal aorta is normal in caliber. No retroperitoneal periportal lymphadenopathy. No free fluid the pelvis. The uterus and ovaries, and bladder are normal. There is sclerosis within the posterior aspect of the right iliac bone (image 92, series 3).  IMPRESSION: 1. Acute pulmonary within the left lower lobe pulmonary arteries. Overall clot burden is mild to moderate. 2. Ill-defined mass within the proximal sigmoid colon / distal descending colon. No evidence of bowel obstruction. 3. 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 4. Tiny indeterminate 5 mm lesion within the central left hepatic lobe. 5. Sclerotic lesion in the posterior aspect of the right iliac bone is also indeterminate but could be evaluated with FDG PET-CT scan.  Critical Value/emergent results were called by telephone at the time of interpretation on 01/06/2014 at 4:02 PM to Dr. Carol Ada , who verbally acknowledged these results.   Electronically Signed   By: Suzy Bouchard M.D.   On: 01/06/2014 16:09     EKG Interpretation None      MDM  Patient has been started on heparin do to her recent diagnosis of lower GI bleeding and cancerous polyp.  This will adequately treat her pulmonary embolus, and is  easily reversible.  If need be.  Patient will be admitted per Dr. Posey Pronto to telemetry bed.  She is in no discomfort at this time. Final diagnoses:  Pulmonary emboli  Colon cancer         Garald Balding, NP 01/06/14 [id number]

## 2014-01-06 NOTE — ED Notes (Signed)
The pt had a colonoscopy  2 weeks ago and today she has a c-t of her chest.  She was called at home and was told that she had a mass in her lung.  No pain or discomfort.  Breast cancer on the lt with a lumpectomy 2005 and 2011 radiation  No chemo

## 2014-01-06 NOTE — ED Notes (Addendum)
Dr. Wofford at bedside 

## 2014-01-07 ENCOUNTER — Encounter (HOSPITAL_COMMUNITY): Payer: Self-pay | Admitting: Internal Medicine

## 2014-01-07 DIAGNOSIS — C50511 Malignant neoplasm of lower-outer quadrant of right female breast: Secondary | ICD-10-CM | POA: Diagnosis present

## 2014-01-07 DIAGNOSIS — K625 Hemorrhage of anus and rectum: Secondary | ICD-10-CM | POA: Diagnosis present

## 2014-01-07 DIAGNOSIS — I10 Essential (primary) hypertension: Secondary | ICD-10-CM | POA: Diagnosis present

## 2014-01-07 DIAGNOSIS — K769 Liver disease, unspecified: Secondary | ICD-10-CM | POA: Diagnosis present

## 2014-01-07 DIAGNOSIS — I2699 Other pulmonary embolism without acute cor pulmonale: Secondary | ICD-10-CM | POA: Diagnosis present

## 2014-01-07 LAB — COMPREHENSIVE METABOLIC PANEL
ALBUMIN: 3.1 g/dL — AB (ref 3.5–5.2)
ALT: 34 U/L (ref 0–35)
AST: 39 U/L — ABNORMAL HIGH (ref 0–37)
Alkaline Phosphatase: 42 U/L (ref 39–117)
BUN: 8 mg/dL (ref 6–23)
CHLORIDE: 104 meq/L (ref 96–112)
CO2: 22 mEq/L (ref 19–32)
CREATININE: 0.84 mg/dL (ref 0.50–1.10)
Calcium: 9.6 mg/dL (ref 8.4–10.5)
GFR calc Af Amer: 88 mL/min — ABNORMAL LOW (ref >90)
GFR, EST NON AFRICAN AMERICAN: 76 mL/min — AB (ref >90)
Glucose, Bld: 100 mg/dL — ABNORMAL HIGH (ref 70–99)
Potassium: 3.4 mEq/L — ABNORMAL LOW (ref 3.7–5.3)
Sodium: 142 mEq/L (ref 137–147)
Total Bilirubin: 0.6 mg/dL (ref 0.3–1.2)
Total Protein: 7.9 g/dL (ref 6.0–8.3)

## 2014-01-07 LAB — CBC
HCT: 39.3 % (ref 36.0–46.0)
Hemoglobin: 13.1 g/dL (ref 12.0–15.0)
MCH: 30.8 pg (ref 26.0–34.0)
MCHC: 33.3 g/dL (ref 30.0–36.0)
MCV: 92.3 fL (ref 78.0–100.0)
Platelets: 289 10*3/uL (ref 150–400)
RBC: 4.26 MIL/uL (ref 3.87–5.11)
RDW: 14.4 % (ref 11.5–15.5)
WBC: 11.8 10*3/uL — ABNORMAL HIGH (ref 4.0–10.5)

## 2014-01-07 LAB — HEPARIN LEVEL (UNFRACTIONATED): Heparin Unfractionated: <0.1 IU/mL — ABNORMAL LOW (ref 0.30–0.70)

## 2014-01-07 LAB — APTT: aPTT: 67 seconds — ABNORMAL HIGH (ref 24–37)

## 2014-01-07 LAB — TROPONIN I: Troponin I: <0.3 ng/mL (ref <0.30)

## 2014-01-07 LAB — PROTIME-INR
INR: 1.07 (ref 0.00–1.49)
Prothrombin Time: 13.7 seconds (ref 11.6–15.2)

## 2014-01-07 MED ORDER — ENOXAPARIN SODIUM 150 MG/ML ~~LOC~~ SOLN
1.0000 mg/kg | Freq: Two times a day (BID) | SUBCUTANEOUS | Status: DC
  Administered 2014-01-08: 12:00:00 via SUBCUTANEOUS
  Administered 2014-01-08: 135 mg via SUBCUTANEOUS
  Filled 2014-01-07 (×4): qty 1

## 2014-01-07 MED ORDER — POTASSIUM CHLORIDE CRYS ER 20 MEQ PO TBCR
40.0000 meq | EXTENDED_RELEASE_TABLET | Freq: Once | ORAL | Status: AC
  Administered 2014-01-07: 40 meq via ORAL
  Filled 2014-01-07: qty 2

## 2014-01-07 MED ORDER — SODIUM CHLORIDE 0.9 % IJ SOLN
3.0000 mL | Freq: Two times a day (BID) | INTRAMUSCULAR | Status: DC
Start: 2014-01-07 — End: 2014-01-08
  Administered 2014-01-07 – 2014-01-08 (×4): 3 mL via INTRAVENOUS

## 2014-01-07 MED ORDER — ONDANSETRON HCL 4 MG/2ML IJ SOLN: 4.0000 mg | Freq: Four times a day (QID) | INTRAMUSCULAR | Status: DC | PRN

## 2014-01-07 MED ORDER — ENOXAPARIN SODIUM 150 MG/ML ~~LOC~~ SOLN
1.0000 mg/kg | SUBCUTANEOUS | Status: AC
  Administered 2014-01-07: 135 mg via SUBCUTANEOUS
  Filled 2014-01-07: qty 1

## 2014-01-07 MED ORDER — HEPARIN BOLUS VIA INFUSION
4000.0000 [IU] | Freq: Once | INTRAVENOUS | Status: AC
  Administered 2014-01-07: 4000 [IU] via INTRAVENOUS
  Filled 2014-01-07: qty 4000

## 2014-01-07 MED ORDER — METOPROLOL TARTRATE 50 MG PO TABS
50.0000 mg | ORAL_TABLET | Freq: Two times a day (BID) | ORAL | Status: DC
  Administered 2014-01-07 – 2014-01-08 (×4): 50 mg via ORAL
  Filled 2014-01-07 (×6): qty 1

## 2014-01-07 MED ORDER — ONDANSETRON HCL 4 MG PO TABS: 4.0000 mg | ORAL_TABLET | Freq: Four times a day (QID) | ORAL | Status: DC | PRN

## 2014-01-07 MED ORDER — ACETAMINOPHEN 650 MG RE SUPP
650.0000 mg | Freq: Four times a day (QID) | RECTAL | Status: DC | PRN
  Filled 2014-01-07: qty 1

## 2014-01-07 MED ORDER — ACETAMINOPHEN 325 MG PO TABS: 650.0000 mg | ORAL_TABLET | Freq: Four times a day (QID) | ORAL | Status: DC | PRN

## 2014-01-07 NOTE — ED Provider Notes (Signed)
Medical screening examination/treatment/procedure(s) were conducted as a shared visit with non-physician practitioner(s) and myself.  I personally evaluated the patient during the encounter.   EKG Interpretation None        Houston Siren III, MD 01/07/14 [phone]

## 2014-01-07 NOTE — ED Provider Notes (Signed)
Medical screening examination/treatment/procedure(s) were conducted as a shared visit with non-physician practitioner(s) and myself.  I personally evaluated the patient during the encounter.   EKG Interpretation None      2 sutural female presenting with chief complaint of abnormal CT scan. A PE was found on the CT of her chest abdomen and pelvis.  On exam, well-appearing, nontoxic, not distressed, lungs clear to auscultation bilaterally, heart sounds normal regular rate and rhythm, abdomen soft and nontender. Started on anticoagulation and admitted to internal medicine.  Clinical Impression: 1. Pulmonary emboli   2. Colon cancer       Houston Siren III, MD 01/07/14 [phone]

## 2014-01-07 NOTE — Progress Notes (Signed)
ANTICOAGULATION CONSULT NOTE - Follow Up Consult  Pharmacy Consult for heparin Indication: pulmonary embolus  Labs:  Recent Labs  01/06/14 2018 01/07/14 0530  HGB 13.5 13.1  HCT 39.3 39.3  PLT 272 289  APTT 29 67*  LABPROT 13.1 13.7  INR 1.01 1.07  HEPARINUNFRC  --  <0.10*  CREATININE 0.80 0.84  TROPONINI  --  <0.30    Assessment: 58yo female undetectable on heparin with initial dosing for PE.  Goal of Therapy:  Heparin level 0.3-0.7 units/ml   Plan:  Will rebolus with heparin 4000 units and increase gtt by 4 units/kg/hr to 2200 units/hr and check level in 6hr.  Wynona Neat, PharmD, BCPS  01/07/2014,7:06 AM

## 2014-01-07 NOTE — Progress Notes (Signed)
[facility] PROGRESS NOTE  Veronica Reyes PYP:[id number] DOB: [birth year] DOA: 01/06/2014 PCP: No primary provider on file.  Assessment/Plan: 1. Pulmonary embolism -Patient with history of breast cancer, now CT scan of abdomen and pelvis showing pulmonary embolism involving left lower lobe pulmonary arteries. -I suspect secondary to underlying malignancy -Will transition patient to subcutaneous Lovenox, upon discontinuation of IV heparin  2. Ill-defined mass of Colon -CT scan of abdomen and pelvis performed on 01/06/2014 show an ill-defined mass within the proximal sigmoid colon and distal descending colon. There is no evidence of bowel obstruction. Also noted were metastatic lymph nodes along the mesenteric border of the colon -I discussed case with Dr. Ninfa Linden of general surgery who did not recommend further interventions while in the hospital. Patient has a followup appointment with [facility] early next week. He recommended patient keeping a followup appointment her treatment options will be discussed  3. History of lower GI bleed -I suspect secondary to underlying ill-defined mass concerning for malignancy -She was started on anticoagulation for the presence of pulmonary embolism -Has not had evidence of GI bleed during this hospitalization -Hemoglobin stable at 13.1  4. Hypertension -Continue metoprolol  5. Liver lesion -CT scan of abdomen and pelvis showed a small low density lesion in the medial left hepatic lobe measuring 5 mm -Patient will followup with a general surgery for further workup  Code Status: Full code Family Communication:  Disposition Plan: Will transition to subcutaneous Lovenox   Consultants: Telephone consultation made to Dr. Ninfa Linden of general surgery   HPI/Subjective: Patient is a pleasant 58 year old female with a past medical history of breast cancer, having a history of bloody stools for which she was referred to GI.  Patient undergoing colonoscopy by Dr. Benson Norway several weeks ago. Did not have colonoscopy report at time of this dictation however this was followed up with a CT scan of abdomen which revealed an ill-defined mass within the proximal sigmoid colon as well as metastatic lymph nodes. This study also revealed the presence of PE. She was advised to present to the emergency room for further workup and treatment. Patient was started on a heparin drip overnight, this morning she states doing well, has no complaints.  Objective: Filed Vitals:   01/07/14 0518  BP: 150/88  Pulse: 75  Temp: 98.3 F (36.8 C)  Resp: 19    Intake/Output Summary (Last 24 hours) at 01/07/14 1155 Last data filed at 01/07/14 0521  Gross per 24 hour  Intake      0 ml  Output    950 ml  Net   -950 ml   Filed Weights   01/06/14 1623 01/06/14 2346 01/07/14 0518  Weight: 132.904 kg (293 lb) 136.215 kg (300 lb 4.8 oz) 136.17 kg (300 lb 3.2 oz)    Exam:   General:  Patient is in no acute distress, she reports doing well, denies bloody stools shortness of breath fevers or chills  Cardiovascular: Regular rate and rhythm  Respiratory: Clear to auscultation bilaterally  Abdomen: Obese, soft nontender nondistended  Musculoskeletal: No edema  Data Reviewed: Basic Metabolic Panel:  Recent Labs Lab 01/06/14 2018 01/07/14 0530  NA 139 142  K 3.5* 3.4*  CL 102 104  CO2 22 22  GLUCOSE 90 100*  BUN 7 8  CREATININE 0.80 0.84  CALCIUM 9.8 9.6   Liver Function Tests:  Recent Labs Lab 01/06/14 2018 01/07/14 0530  AST 41* 39*  ALT 36* 34  ALKPHOS 43 42  BILITOT 0.5 0.6  PROT 8.0 7.9  ALBUMIN 3.2* 3.1*   No results found for this basename: LIPASE, AMYLASE,  in the last 168 hours No results found for this basename: AMMONIA,  in the last 168 hours CBC:  Recent Labs Lab 01/06/14 2018 01/07/14 0530  WBC 11.9* 11.8*  NEUTROABS 6.9  --   HGB 13.5 13.1  HCT 39.3 39.3  MCV 91.4 92.3  PLT 272 289   Cardiac  Enzymes:  Recent Labs Lab 01/07/14 0530  TROPONINI <0.30   BNP (last 3 results) No results found for this basename: PROBNP,  in the last 8760 hours CBG: No results found for this basename: GLUCAP,  in the last 168 hours  No results found for this or any previous visit (from the past 240 hour(s)).   Studies: Ct Chest W Contrast  01/06/2014   CLINICAL DATA:  Initial diagnosis colon cancer. Colonoscopy 2 weeks prior. Blood in stool. Prior history of left breast cancer.  EXAM: CT CHEST, ABDOMEN, AND PELVIS WITH CONTRAST  TECHNIQUE: Multidetector CT imaging of the chest, abdomen and pelvis was performed following the standard protocol during bolus administration of intravenous contrast.  CONTRAST:  122mL OMNIPAQUE IOHEXOL 300 MG/ML  SOLN  COMPARISON:  None.  FINDINGS: CT CHEST FINDINGS  No axillary or supraclavicular lymphadenopathy. No mediastinal hilar lymphadenopathy. No pericardial fluid. Esophagus normal.  No central pulmonary embolism. There are several small tubular filling defect within the left lower lobe pulmonary artery extent the lower lobe lower lobes segments and the superior segment of the little lower lobe. Small filling defect within the left upper lobe additionally (image 18.  Review of the lung windows demonstrates no pulmonary nodules. No evidence of pulmonary infarction. There is mild atelectasis at left lung base  There is a small low-density lesion in the medial left hepatic lobe measuring 5 mm (segment 4A). Post cholecystectomy. The pancreas, spleen, adrenal glands are normal. There are bilateral simple fluid density renal cysts.  The stomach, small bowel, appendix, and cecum are normal. The colon demonstrates an ill-defined mass at the junction of the descending colon and sigmoid colon. This ill-defined mass involves approximately 6 cm segment of the colon best seen on coronal image 59. There is no evidence of obstruction proximal to this mass. There is a stool in the sigmoid  colon and rectum. There is a abnormal lymph node along the mesenteric border of the descending colon adjacent to the mass measuring 15 mm (image 80, series 3).  The abdominal aorta is normal in caliber. No retroperitoneal periportal lymphadenopathy.  No free fluid the pelvis. The uterus and ovaries by. The bladder is normal.  There is sclerosis within the posterior aspect of the right iliac bone (image 92, series 3.  CT ABDOMEN AND PELVIS FINDINGS  There is a small low-density lesion in the medial left hepatic lobe measuring 5 mm (segment 4A). Post cholecystectomy. The pancreas, spleen, adrenal glands are normal. There are bilateral simple fluid density renal cysts.  The stomach, small bowel, appendix, and cecum are normal. The colon demonstrates an ill-defined mass at the junction of the descending colon and sigmoid colon. This ill-defined mass involves approximately 6 cm segment of the colon best seen on coronal image 59. There is no evidence of obstruction proximal to this mass. There is a stool in the sigmoid colon and rectum. There is a abnormal lymph node along the mesenteric border of the descending colon adjacent to the mass measuring 15 mm (image 80, series 3).  The abdominal aorta  is normal in caliber. No retroperitoneal periportal lymphadenopathy. No free fluid the pelvis. The uterus and ovaries, and bladder are normal. There is sclerosis within the posterior aspect of the right iliac bone (image 92, series 3).  IMPRESSION: 1. Acute pulmonary within the left lower lobe pulmonary arteries. Overall clot burden is mild to moderate. 2. Ill-defined mass within the proximal sigmoid colon / distal descending colon. No evidence of bowel obstruction. 3. 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 4. Tiny indeterminate 5 mm lesion within the central left hepatic lobe. 5. Sclerotic lesion in the posterior aspect of the right iliac bone is also indeterminate but could be  evaluated with FDG PET-CT scan.  Critical Value/emergent results were called by telephone at the time of interpretation on 01/06/2014 at 4:02 PM to Dr. Carol Ada , who verbally acknowledged these results.   Electronically Signed   By: Suzy Bouchard M.D.   On: 01/06/2014 16:09   Ct Abdomen Pelvis W Contrast  01/06/2014   CLINICAL DATA:  Initial diagnosis colon cancer. Colonoscopy 2 weeks prior. Blood in stool. Prior history of left breast cancer.  EXAM: CT CHEST, ABDOMEN, AND PELVIS WITH CONTRAST  TECHNIQUE: Multidetector CT imaging of the chest, abdomen and pelvis was performed following the standard protocol during bolus administration of intravenous contrast.  CONTRAST:  192mL OMNIPAQUE IOHEXOL 300 MG/ML  SOLN  COMPARISON:  None.  FINDINGS: CT CHEST FINDINGS  No axillary or supraclavicular lymphadenopathy. No mediastinal hilar lymphadenopathy. No pericardial fluid. Esophagus normal.  No central pulmonary embolism. There are several small tubular filling defect within the left lower lobe pulmonary artery extent the lower lobe lower lobes segments and the superior segment of the little lower lobe. Small filling defect within the left upper lobe additionally (image 18.  Review of the lung windows demonstrates no pulmonary nodules. No evidence of pulmonary infarction. There is mild atelectasis at left lung base  There is a small low-density lesion in the medial left hepatic lobe measuring 5 mm (segment 4A). Post cholecystectomy. The pancreas, spleen, adrenal glands are normal. There are bilateral simple fluid density renal cysts.  The stomach, small bowel, appendix, and cecum are normal. The colon demonstrates an ill-defined mass at the junction of the descending colon and sigmoid colon. This ill-defined mass involves approximately 6 cm segment of the colon best seen on coronal image 59. There is no evidence of obstruction proximal to this mass. There is a stool in the sigmoid colon and rectum. There is a  abnormal lymph node along the mesenteric border of the descending colon adjacent to the mass measuring 15 mm (image 80, series 3).  The abdominal aorta is normal in caliber. No retroperitoneal periportal lymphadenopathy.  No free fluid the pelvis. The uterus and ovaries by. The bladder is normal.  There is sclerosis within the posterior aspect of the right iliac bone (image 92, series 3.  CT ABDOMEN AND PELVIS FINDINGS  There is a small low-density lesion in the medial left hepatic lobe measuring 5 mm (segment 4A). Post cholecystectomy. The pancreas, spleen, adrenal glands are normal. There are bilateral simple fluid density renal cysts.  The stomach, small bowel, appendix, and cecum are normal. The colon demonstrates an ill-defined mass at the junction of the descending colon and sigmoid colon. This ill-defined mass involves approximately 6 cm segment of the colon best seen on coronal image 59. There is no evidence of obstruction proximal to this mass. There is a stool  in the sigmoid colon and rectum. There is a abnormal lymph node along the mesenteric border of the descending colon adjacent to the mass measuring 15 mm (image 80, series 3).  The abdominal aorta is normal in caliber. No retroperitoneal periportal lymphadenopathy. No free fluid the pelvis. The uterus and ovaries, and bladder are normal. There is sclerosis within the posterior aspect of the right iliac bone (image 92, series 3).  IMPRESSION: 1. Acute pulmonary within the left lower lobe pulmonary arteries. Overall clot burden is mild to moderate. 2. Ill-defined mass within the proximal sigmoid colon / distal descending colon. No evidence of bowel obstruction. 3. 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 4. Tiny indeterminate 5 mm lesion within the central left hepatic lobe. 5. Sclerotic lesion in the posterior aspect of the right iliac bone is also indeterminate but could be evaluated with FDG PET-CT scan.   Critical Value/emergent results were called by telephone at the time of interpretation on 01/06/2014 at 4:02 PM to Dr. Carol Ada , who verbally acknowledged these results.   Electronically Signed   By: Suzy Bouchard M.D.   On: 01/06/2014 16:09    Scheduled Meds: . metoprolol  50 mg Oral BID  . sodium chloride  3 mL Intravenous Q12H   Continuous Infusions: . heparin 2,200 Units/hr (01/07/14 0735)    Principal Problem:   Pulmonary embolism Active Problems:   Colonic mass   Hepatic lesion   Breast cancer   Essential hypertension, benign   History of BRBPR (bright red blood per rectum)    Time spent: 35 minutes    [facility] Pager [phone]. If 7PM-7AM, please contact night-coverage at [email or web address], password [facility] 01/07/2014, 11:55 AM  LOS: 1 day

## 2014-01-07 NOTE — Progress Notes (Addendum)
ANTICOAGULATION CONSULT NOTE - Follow Up Consult  Pharmacy Consult for Heparin >> Lovenox Indication: pulmonary embolus  No Known Allergies  Patient Measurements: Height: 5\' 6"  (167.6 cm) Weight: 300 lb 3.2 oz (136.17 kg) IBW/kg (Calculated) : 59.3  Vital Signs: Temp: 98.3 F (36.8 C) (04/25 0518) Temp src: Oral (04/25 0518) BP: 150/88 mmHg (04/25 0518) Pulse Rate: 75 (04/25 0518)  Labs:  Recent Labs  01/06/14 2018 01/07/14 0530  HGB 13.5 13.1  HCT 39.3 39.3  PLT 272 289  APTT 29 67*  LABPROT 13.1 13.7  INR 1.01 1.07  HEPARINUNFRC  --  <0.10*  CREATININE 0.80 0.84  TROPONINI  --  <0.30    Estimated Creatinine Clearance: 106.4 ml/min (by C-G formula based on Cr of 0.84).   Medications:  Scheduled:  . enoxaparin (LOVENOX) injection  1 mg/kg Subcutaneous NOW   Followed by  . [START ON 01/08/2014] enoxaparin (LOVENOX) injection  1 mg/kg Subcutaneous Q12H  . metoprolol  50 mg Oral BID  . potassium chloride  40 mEq Oral Once  . sodium chloride  3 mL Intravenous Q12H   Infusions:    Assessment: 58 yo F with hx breast cancer and new mass in colon concerning for malignancy.  Pt found to have a PE in the setting of likely malignancy and will transition from heparin to treatment dose Lovenox for VTE treatment per oncology guidelines.  Wt = 135 kg SCr 0.84 with Cr >100  Goal of Therapy:  Anti-Xa level 0.6-1 units/ml 4hrs after LMWH dose given Monitor platelets by anticoagulation protocol: Yes   Plan:  Discontinue heparin infusion and associated labs. Lovenox 135 mg SQ Q12h - first dose now. Lovenox education and instructional video.  [employer], Pharm.D., BCPS Clinical Pharmacist Pager [phone] 01/07/2014 1:17 PM   Completed Lovenox education with patient and husband.  He is very supportive and involved in her care.  He will be administering shots for patient.  Explained administration dose, location, and technique.  Discussed bleeding risk.  Arranged  for viewing of Lovenox Patient Education video to reinforce teaching.  [employer], Pharm.D., BCPS Clinical Pharmacist Pager [phone] 01/07/2014 1:42 PM

## 2014-01-07 NOTE — H&P (Signed)
[facility] History and Physical  Patient: Veronica Reyes  DXA:[id number]  DOB: January 07, 1956  DOS: the patient was seen and examined on 01/07/2014 PCP: No primary provider on file.  Chief Complaint: Abnormal imaging of  HPI: Veronica Reyes is a 58 y.o. female with Past medical history of breast cancer, hypertension. The patient presented with complaints of abnormal imaging. Since last one month she has been noting bright red blood per rectum with bowel movements. She went to see her PCP for this complain and was referred to gastroenterology and underwent colonoscopy. As per the patient her colonoscopy showed possible cancer and her gastroenterologist ordered a CT scan of her abdomen and pelvis with chest with contrast. She was scheduled to see surgical services next Monday. Patient's CT chest was showing left lower lobe pulmonary embolism therefore the patient was brought to the hospital. The patient denies any complaint of chest pain, shortness of breath, palpitation, fever, chills, dizziness, lightheadedness, syncopal episodes, nausea, vomiting, diarrhea, burning urination, leg swelling. She denies any prior history of blood clots.  The patient is coming from home. And at her baseline independent for most of her ADL.  Review of Systems: as mentioned in the history of present illness.  A Comprehensive review of the other systems is negative.  Past Medical History  Diagnosis Date  . Hypertension   . Cancer   . Breast CA    Past Surgical History  Procedure Laterality Date  . Cesarean section     Social History:  reports that she has never smoked. She does not have any smokeless tobacco history on file. She reports that she does not drink alcohol or use illicit drugs.  No Known Allergies  Family History  Problem Relation Age of Onset  . Hypertension Sister   . Cancer Cousin     Prior to Admission medications   Medication Sig Start Date End Date Taking?  Authorizing Provider  hydrochlorothiazide (HYDRODIURIL) 25 MG tablet Take 25 mg by mouth every morning.    Yes Historical Provider, MD  Ibuprofen-Diphenhydramine Cit (ADVIL PM PO) Take 2 tablets by mouth at bedtime as needed (sleep).   Yes Historical Provider, MD  metoprolol (LOPRESSOR) 50 MG tablet Take 50 mg by mouth 2 (two) times daily.   Yes Historical Provider, MD  POTASSIUM PO Take 1 tablet by mouth 2 (two) times daily.   Yes Historical Provider, MD    Physical Exam: Filed Vitals:   01/06/14 2245 01/06/14 2300 01/06/14 2315 01/06/14 2346  BP: 150/80 158/82 149/83 163/84  Pulse: 84 82 82 92  Temp:    98.7 F (37.1 C)  TempSrc:    Oral  Resp: 18  17 18   Height:      Weight:    136.215 kg (300 lb 4.8 oz)  SpO2: 96% 95% 94% 99%    General: Alert, Awake and Oriented to Time, Place and Person. Appear in mild distress Eyes: PERRL ENT: Oral Mucosa clear moist Neck: no JVD Cardiovascular: S1 and S2 Present, no Murmur, Peripheral Pulses Present Respiratory: Bilateral Air entry equal and Decreased, Clear to Auscultation,  no Crackles,no wheezes Abdomen: Bowel Sound Present, Soft and Non tender Skin: no Rash Extremities: Bilateral Pedal edema, no calf tenderness Neurologic: Grossly no focal neuro deficit.  Labs on Admission:  CBC:  Recent Labs Lab 01/06/14 2018  WBC 11.9*  NEUTROABS 6.9  HGB 13.5  HCT 39.3  MCV 91.4  PLT 272    CMP     Component Value Date/Time  NA 139 01/06/2014 2018   K 3.5* 01/06/2014 2018   CL 102 01/06/2014 2018   CO2 22 01/06/2014 2018   GLUCOSE 90 01/06/2014 2018   BUN 7 01/06/2014 2018   CREATININE 0.80 01/06/2014 2018   CALCIUM 9.8 01/06/2014 2018   PROT 8.0 01/06/2014 2018   ALBUMIN 3.2* 01/06/2014 2018   AST 41* 01/06/2014 2018   ALT 36* 01/06/2014 2018   ALKPHOS 43 01/06/2014 2018   BILITOT 0.5 01/06/2014 2018   GFRNONAA 81* 01/06/2014 2018   GFRAA >90 01/06/2014 2018    No results found for this basename: LIPASE, AMYLASE,  in the last 168  hours No results found for this basename: AMMONIA,  in the last 168 hours  No results found for this basename: CKTOTAL, CKMB, CKMBINDEX, TROPONINI,  in the last 168 hours BNP (last 3 results) No results found for this basename: PROBNP,  in the last 8760 hours  Radiological Exams on Admission: Ct Chest W Contrast  01/06/2014   CLINICAL DATA:  Initial diagnosis colon cancer. Colonoscopy 2 weeks prior. Blood in stool. Prior history of left breast cancer.  EXAM: CT CHEST, ABDOMEN, AND PELVIS WITH CONTRAST  TECHNIQUE: Multidetector CT imaging of the chest, abdomen and pelvis was performed following the standard protocol during bolus administration of intravenous contrast.  CONTRAST:  123mL OMNIPAQUE IOHEXOL 300 MG/ML  SOLN  COMPARISON:  None.  FINDINGS: CT CHEST FINDINGS  No axillary or supraclavicular lymphadenopathy. No mediastinal hilar lymphadenopathy. No pericardial fluid. Esophagus normal.  No central pulmonary embolism. There are several small tubular filling defect within the left lower lobe pulmonary artery extent the lower lobe lower lobes segments and the superior segment of the little lower lobe. Small filling defect within the left upper lobe additionally (image 18.  Review of the lung windows demonstrates no pulmonary nodules. No evidence of pulmonary infarction. There is mild atelectasis at left lung base  There is a small low-density lesion in the medial left hepatic lobe measuring 5 mm (segment 4A). Post cholecystectomy. The pancreas, spleen, adrenal glands are normal. There are bilateral simple fluid density renal cysts.  The stomach, small bowel, appendix, and cecum are normal. The colon demonstrates an ill-defined mass at the junction of the descending colon and sigmoid colon. This ill-defined mass involves approximately 6 cm segment of the colon best seen on coronal image 59. There is no evidence of obstruction proximal to this mass. There is a stool in the sigmoid colon and rectum. There is  a abnormal lymph node along the mesenteric border of the descending colon adjacent to the mass measuring 15 mm (image 80, series 3).  The abdominal aorta is normal in caliber. No retroperitoneal periportal lymphadenopathy.  No free fluid the pelvis. The uterus and ovaries by. The bladder is normal.  There is sclerosis within the posterior aspect of the right iliac bone (image 92, series 3.  CT ABDOMEN AND PELVIS FINDINGS  There is a small low-density lesion in the medial left hepatic lobe measuring 5 mm (segment 4A). Post cholecystectomy. The pancreas, spleen, adrenal glands are normal. There are bilateral simple fluid density renal cysts.  The stomach, small bowel, appendix, and cecum are normal. The colon demonstrates an ill-defined mass at the junction of the descending colon and sigmoid colon. This ill-defined mass involves approximately 6 cm segment of the colon best seen on coronal image 59. There is no evidence of obstruction proximal to this mass. There is a stool in the sigmoid colon and rectum. There  is a abnormal lymph node along the mesenteric border of the descending colon adjacent to the mass measuring 15 mm (image 80, series 3).  The abdominal aorta is normal in caliber. No retroperitoneal periportal lymphadenopathy. No free fluid the pelvis. The uterus and ovaries, and bladder are normal. There is sclerosis within the posterior aspect of the right iliac bone (image 92, series 3).  IMPRESSION: 1. Acute pulmonary within the left lower lobe pulmonary arteries. Overall clot burden is mild to moderate. 2. Ill-defined mass within the proximal sigmoid colon / distal descending colon. No evidence of bowel obstruction. 3. 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 4. Tiny indeterminate 5 mm lesion within the central left hepatic lobe. 5. Sclerotic lesion in the posterior aspect of the right iliac bone is also indeterminate but could be evaluated with FDG PET-CT scan.   Critical Value/emergent results were called by telephone at the time of interpretation on 01/06/2014 at 4:02 PM to Dr. Carol Ada , who verbally acknowledged these results.   Electronically Signed   By: Suzy Bouchard M.D.   On: 01/06/2014 16:09   Ct Abdomen Pelvis W Contrast  01/06/2014   CLINICAL DATA:  Initial diagnosis colon cancer. Colonoscopy 2 weeks prior. Blood in stool. Prior history of left breast cancer.  EXAM: CT CHEST, ABDOMEN, AND PELVIS WITH CONTRAST  TECHNIQUE: Multidetector CT imaging of the chest, abdomen and pelvis was performed following the standard protocol during bolus administration of intravenous contrast.  CONTRAST:  130mL OMNIPAQUE IOHEXOL 300 MG/ML  SOLN  COMPARISON:  None.  FINDINGS: CT CHEST FINDINGS  No axillary or supraclavicular lymphadenopathy. No mediastinal hilar lymphadenopathy. No pericardial fluid. Esophagus normal.  No central pulmonary embolism. There are several small tubular filling defect within the left lower lobe pulmonary artery extent the lower lobe lower lobes segments and the superior segment of the little lower lobe. Small filling defect within the left upper lobe additionally (image 18.  Review of the lung windows demonstrates no pulmonary nodules. No evidence of pulmonary infarction. There is mild atelectasis at left lung base  There is a small low-density lesion in the medial left hepatic lobe measuring 5 mm (segment 4A). Post cholecystectomy. The pancreas, spleen, adrenal glands are normal. There are bilateral simple fluid density renal cysts.  The stomach, small bowel, appendix, and cecum are normal. The colon demonstrates an ill-defined mass at the junction of the descending colon and sigmoid colon. This ill-defined mass involves approximately 6 cm segment of the colon best seen on coronal image 59. There is no evidence of obstruction proximal to this mass. There is a stool in the sigmoid colon and rectum. There is a abnormal lymph node along the  mesenteric border of the descending colon adjacent to the mass measuring 15 mm (image 80, series 3).  The abdominal aorta is normal in caliber. No retroperitoneal periportal lymphadenopathy.  No free fluid the pelvis. The uterus and ovaries by. The bladder is normal.  There is sclerosis within the posterior aspect of the right iliac bone (image 92, series 3.  CT ABDOMEN AND PELVIS FINDINGS  There is a small low-density lesion in the medial left hepatic lobe measuring 5 mm (segment 4A). Post cholecystectomy. The pancreas, spleen, adrenal glands are normal. There are bilateral simple fluid density renal cysts.  The stomach, small bowel, appendix, and cecum are normal. The colon demonstrates an ill-defined mass at the junction of the descending colon and sigmoid colon. This ill-defined mass  involves approximately 6 cm segment of the colon best seen on coronal image 59. There is no evidence of obstruction proximal to this mass. There is a stool in the sigmoid colon and rectum. There is a abnormal lymph node along the mesenteric border of the descending colon adjacent to the mass measuring 15 mm (image 80, series 3).  The abdominal aorta is normal in caliber. No retroperitoneal periportal lymphadenopathy. No free fluid the pelvis. The uterus and ovaries, and bladder are normal. There is sclerosis within the posterior aspect of the right iliac bone (image 92, series 3).  IMPRESSION: 1. Acute pulmonary within the left lower lobe pulmonary arteries. Overall clot burden is mild to moderate. 2. Ill-defined mass within the proximal sigmoid colon / distal descending colon. No evidence of bowel obstruction. 3. 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 4. Tiny indeterminate 5 mm lesion within the central left hepatic lobe. 5. Sclerotic lesion in the posterior aspect of the right iliac bone is also indeterminate but could be evaluated with FDG PET-CT scan.  Critical Value/emergent results  were called by telephone at the time of interpretation on 01/06/2014 at 4:02 PM to Dr. Carol Ada , who verbally acknowledged these results.   Electronically Signed   By: Suzy Bouchard M.D.   On: 01/06/2014 16:09     Assessment/Plan Principal Problem:   Pulmonary embolism Active Problems:   Colonic mass   Hepatic lesion   Breast cancer   Essential hypertension, benign   History of BRBPR (bright red blood per rectum)   1. Pulmonary embolism  the patient is presenting with an abnormal imaging of CT chest showing left lower lobe pulmonary embolism. Patient is not hypoxic does not have any chest pain or dizziness. With her history of colonic mass and bright red blood per rectum the patient is admitted to the hospital for close monitoring of anticoagulation with IV heparin. I will obtain a CBC and also lower extremity Doppler.  2.Colonic mass The patient has 6 cm colonic mass most likely possible malignancy. She is scheduled to see Gen. surgery next Monday but since currently she is not on any long-term anticoagulation and may be on long-term interpretation the time of discharge and followup with general surgery it might be worthwhile to discuss with them prior to discharge.  3.Hypertension  Continue metoprolol   4.hepatic lesion  May need a further follow up  Nutrition:  cardiac   Code Status:  full  Disposition: Admitted to inpatient in telemetry unit.  Author: Berle Mull, MD [facility] Pager: [phone] 01/07/2014, 12:25 AM    If 7PM-7AM, please contact night-coverage [email or web address] Password [email or web address]

## 2014-01-08 DIAGNOSIS — I10 Essential (primary) hypertension: Secondary | ICD-10-CM

## 2014-01-08 DIAGNOSIS — K7689 Other specified diseases of liver: Secondary | ICD-10-CM

## 2014-01-08 DIAGNOSIS — K6389 Other specified diseases of intestine: Secondary | ICD-10-CM

## 2014-01-08 DIAGNOSIS — C50919 Malignant neoplasm of unspecified site of unspecified female breast: Secondary | ICD-10-CM

## 2014-01-08 LAB — CBC
HCT: 37.7 % (ref 36.0–46.0)
Hemoglobin: 12.3 g/dL (ref 12.0–15.0)
MCH: 30.3 pg (ref 26.0–34.0)
MCHC: 32.6 g/dL (ref 30.0–36.0)
MCV: 92.9 fL (ref 78.0–100.0)
PLATELETS: 259 10*3/uL (ref 150–400)
RBC: 4.06 MIL/uL (ref 3.87–5.11)
RDW: 14.5 % (ref 11.5–15.5)
WBC: 8.7 10*3/uL (ref 4.0–10.5)

## 2014-01-08 MED ORDER — ENOXAPARIN SODIUM 150 MG/ML ~~LOC~~ SOLN: 1.0000 mg/kg | Freq: Two times a day (BID) | SUBCUTANEOUS | Status: DC

## 2014-01-08 NOTE — Discharge Summary (Signed)
Physician Discharge Summary  [name] [id number] DOB: [birth year] DOA: 01/06/2014  PCP: No primary provider on file.  Admit date: 01/06/2014 Discharge date: 01/08/2014  Time spent: 35 minutes  Recommendations for Outpatient Follow-up:  1. Please follow up on Colonic Mass, has an appoitment with [facility] on 01/09/2014  2. Patient diagnosed with PE is being discharged on Lovenox [place]  Discharge Diagnoses:  Principal Problem:   Pulmonary embolism Active Problems:   Colonic mass   Hepatic lesion   Breast cancer   Essential hypertension, benign   History of BRBPR (bright red blood per rectum)   Discharge Condition: Stable/Improved  Diet recommendation: Regular Diet  Filed Weights   01/06/14 2346 01/07/14 0518 01/08/14 0443  Weight: 136.215 kg (300 lb 4.8 oz) 136.17 kg (300 lb 3.2 oz) 137.2 kg (302 lb 7.5 oz)    History of present illness:  Veronica Reyes is a 58 y.o. female with Past medical history of breast cancer, hypertension.  The patient presented with complaints of abnormal imaging. Since last one month she has been noting bright red blood per rectum with bowel movements. She went to see her PCP for this complain and was referred to gastroenterology and underwent colonoscopy. As per the patient her colonoscopy showed possible cancer and her gastroenterologist ordered a CT scan of her abdomen and pelvis with chest with contrast. She was scheduled to see surgical services next Monday. Patient's CT chest was showing left lower lobe pulmonary embolism therefore the patient was brought to the hospital.  The patient denies any complaint of chest pain, shortness of breath, palpitation, fever, chills, dizziness, lightheadedness, syncopal episodes, nausea, vomiting, diarrhea, burning urination, leg swelling.  She denies any prior history of blood clots.  The patient is coming from home. And at her baseline independent for most of her  ADL.   Hospital Course:  Patient is a 58 year old female with a past medical history of breast cancer, status post lumpectomy, having a history of bloody stools for which she was referred to Dr. Ulyses Amor office of gastroenterology. She underwent colonoscopy several weeks ago. Colonoscopy report unavailable at time however it appears it was followed up with a CT scan of abdomen and pelvis which revealed an ill-defined mass within the proximal sigmoid colon as well as metastatic lymph nodes. This study also revealed the presence of pulmonary embolism. She was advised to present to the emergency department for further workup and treatment. She was initially started on IV heparin, and with history of malignancy transitioned to subcutaneous Lovenox. Pharmacy was consulted for Lovenox dosing and she and her husband were given instruction on home medication administration. During this hospitalization she remained stable, without evidence of respiratory or hemodynamic compromise. She tolerated by mouth intake, did not have evidence of GI bleed. Given clinical stability she was discharged to her home in stable condition on 01/08/2014. Prior to discharge social work consulted for prescription assistance with Lovenox.   Consultations:  Pharmacy  Social work  Discharge Exam: Filed Vitals:   01/08/14 0443  BP: 119/70  Pulse: 69  Temp: 98 F (36.7 C)  Resp: 18    General: Patient is in no acute distress, looking for to discharge Cardiovascular: Regular rate and rhythm normal S1-S2 Respiratory: Clear to auscultation bilaterally no wheezing rhonchi or rales Abdomen: Soft nontender nondistended  Discharge Instructions You were cared for by a hospitalist during your hospital stay. If you have any questions about your discharge medications or the care you received while you were in  the hospital after you are discharged, you can call the unit and asked to speak with the hospitalist on call if the hospitalist  that took care of you is not available. Once you are discharged, your primary care physician will handle any further medical issues. Please note that NO REFILLS for any discharge medications will be authorized once you are discharged, as it is imperative that you return to your primary care physician (or establish a relationship with a primary care physician if you do not have one) for your aftercare needs so that they can reassess your need for medications and monitor your lab values.  Discharge Orders   Future Appointments Provider Department Dept Phone   01/09/2014 10:30 AM [facility] [place]  IMAGING [phone]   Please wear two piece clothing and wear no powder or deodorant. Please arrive 15 minutes early prior to your appointment time.   7/37/1062 6:94 PM Leighton Ruff, MD [facility], [place] [phone]   Future Orders Complete By Expires   Call MD for:  difficulty breathing, headache or visual disturbances  As directed    Call MD for:  extreme fatigue  As directed    Call MD for:  persistant dizziness or light-headedness  As directed    Call MD for:  persistant nausea and vomiting  As directed    Call MD for:  severe uncontrolled pain  As directed    Diet - low sodium heart healthy  As directed    Discharge instructions  As directed    Increase activity slowly  As directed        Medication List         ADVIL PM PO  Take 2 tablets by mouth at bedtime as needed (sleep).     enoxaparin 150 MG/ML injection  Commonly known as:  LOVENOX  Inject 0.91 mLs (135 mg total) into the skin every 12 (twelve) hours.     hydrochlorothiazide 25 MG tablet  Commonly known as:  HYDRODIURIL  Take 25 mg by mouth every morning.     metoprolol 50 MG tablet  Commonly known as:  LOPRESSOR  Take 50 mg by mouth 2 (two) times daily.     POTASSIUM PO  Take 1 tablet by mouth 2 (two) times daily.       No Known Allergies     Follow-up Information   Follow  up with [facility] On 01/09/2014. (Keep your scheduled appointment)    Contact information:   [place] [id number] [phone]       The results of significant diagnostics from this hospitalization (including imaging, microbiology, ancillary and laboratory) are listed below for reference.    Significant Diagnostic Studies: Ct Chest W Contrast  01/06/2014   CLINICAL DATA:  Initial diagnosis colon cancer. Colonoscopy 2 weeks prior. Blood in stool. Prior history of left breast cancer.  EXAM: CT CHEST, ABDOMEN, AND PELVIS WITH CONTRAST  TECHNIQUE: Multidetector CT imaging of the chest, abdomen and pelvis was performed following the standard protocol during bolus administration of intravenous contrast.  CONTRAST:  154mL OMNIPAQUE IOHEXOL 300 MG/ML  SOLN  COMPARISON:  None.  FINDINGS: CT CHEST FINDINGS  No axillary or supraclavicular lymphadenopathy. No mediastinal hilar lymphadenopathy. No pericardial fluid. Esophagus normal.  No central pulmonary embolism. There are several small tubular filling defect within the left lower lobe pulmonary artery extent the lower lobe lower lobes segments and the superior segment of the little  lower lobe. Small filling defect within the left upper lobe additionally (image 18.  Review of the lung windows demonstrates no pulmonary nodules. No evidence of pulmonary infarction. There is mild atelectasis at left lung base  There is a small low-density lesion in the medial left hepatic lobe measuring 5 mm (segment 4A). Post cholecystectomy. The pancreas, spleen, adrenal glands are normal. There are bilateral simple fluid density renal cysts.  The stomach, small bowel, appendix, and cecum are normal. The colon demonstrates an ill-defined mass at the junction of the descending colon and sigmoid colon. This ill-defined mass involves approximately 6 cm segment of the colon best seen on coronal image 59. There is no evidence of  obstruction proximal to this mass. There is a stool in the sigmoid colon and rectum. There is a abnormal lymph node along the mesenteric border of the descending colon adjacent to the mass measuring 15 mm (image 80, series 3).  The abdominal aorta is normal in caliber. No retroperitoneal periportal lymphadenopathy.  No free fluid the pelvis. The uterus and ovaries by. The bladder is normal.  There is sclerosis within the posterior aspect of the right iliac bone (image 92, series 3.  CT ABDOMEN AND PELVIS FINDINGS  There is a small low-density lesion in the medial left hepatic lobe measuring 5 mm (segment 4A). Post cholecystectomy. The pancreas, spleen, adrenal glands are normal. There are bilateral simple fluid density renal cysts.  The stomach, small bowel, appendix, and cecum are normal. The colon demonstrates an ill-defined mass at the junction of the descending colon and sigmoid colon. This ill-defined mass involves approximately 6 cm segment of the colon best seen on coronal image 59. There is no evidence of obstruction proximal to this mass. There is a stool in the sigmoid colon and rectum. There is a abnormal lymph node along the mesenteric border of the descending colon adjacent to the mass measuring 15 mm (image 80, series 3).  The abdominal aorta is normal in caliber. No retroperitoneal periportal lymphadenopathy. No free fluid the pelvis. The uterus and ovaries, and bladder are normal. There is sclerosis within the posterior aspect of the right iliac bone (image 92, series 3).  IMPRESSION: 1. Acute pulmonary within the left lower lobe pulmonary arteries. Overall clot burden is mild to moderate. 2. Ill-defined mass within the proximal sigmoid colon / distal descending colon. No evidence of bowel obstruction. 3. 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 4. Tiny indeterminate 5 mm lesion within the central left hepatic lobe. 5. Sclerotic lesion in the posterior  aspect of the right iliac bone is also indeterminate but could be evaluated with FDG PET-CT scan.  Critical Value/emergent results were called by telephone at the time of interpretation on 01/06/2014 at 4:02 PM to Dr. Carol Ada , who verbally acknowledged these results.   Electronically Signed   By: Suzy Bouchard M.D.   On: 01/06/2014 16:09   Ct Abdomen Pelvis W Contrast  01/06/2014   CLINICAL DATA:  Initial diagnosis colon cancer. Colonoscopy 2 weeks prior. Blood in stool. Prior history of left breast cancer.  EXAM: CT CHEST, ABDOMEN, AND PELVIS WITH CONTRAST  TECHNIQUE: Multidetector CT imaging of the chest, abdomen and pelvis was performed following the standard protocol during bolus administration of intravenous contrast.  CONTRAST:  171mL OMNIPAQUE IOHEXOL 300 MG/ML  SOLN  COMPARISON:  None.  FINDINGS: CT CHEST FINDINGS  No axillary or supraclavicular lymphadenopathy. No mediastinal hilar lymphadenopathy. No pericardial fluid.  Esophagus normal.  No central pulmonary embolism. There are several small tubular filling defect within the left lower lobe pulmonary artery extent the lower lobe lower lobes segments and the superior segment of the little lower lobe. Small filling defect within the left upper lobe additionally (image 18.  Review of the lung windows demonstrates no pulmonary nodules. No evidence of pulmonary infarction. There is mild atelectasis at left lung base  There is a small low-density lesion in the medial left hepatic lobe measuring 5 mm (segment 4A). Post cholecystectomy. The pancreas, spleen, adrenal glands are normal. There are bilateral simple fluid density renal cysts.  The stomach, small bowel, appendix, and cecum are normal. The colon demonstrates an ill-defined mass at the junction of the descending colon and sigmoid colon. This ill-defined mass involves approximately 6 cm segment of the colon best seen on coronal image 59. There is no evidence of obstruction proximal to this mass.  There is a stool in the sigmoid colon and rectum. There is a abnormal lymph node along the mesenteric border of the descending colon adjacent to the mass measuring 15 mm (image 80, series 3).  The abdominal aorta is normal in caliber. No retroperitoneal periportal lymphadenopathy.  No free fluid the pelvis. The uterus and ovaries by. The bladder is normal.  There is sclerosis within the posterior aspect of the right iliac bone (image 92, series 3.  CT ABDOMEN AND PELVIS FINDINGS  There is a small low-density lesion in the medial left hepatic lobe measuring 5 mm (segment 4A). Post cholecystectomy. The pancreas, spleen, adrenal glands are normal. There are bilateral simple fluid density renal cysts.  The stomach, small bowel, appendix, and cecum are normal. The colon demonstrates an ill-defined mass at the junction of the descending colon and sigmoid colon. This ill-defined mass involves approximately 6 cm segment of the colon best seen on coronal image 59. There is no evidence of obstruction proximal to this mass. There is a stool in the sigmoid colon and rectum. There is a abnormal lymph node along the mesenteric border of the descending colon adjacent to the mass measuring 15 mm (image 80, series 3).  The abdominal aorta is normal in caliber. No retroperitoneal periportal lymphadenopathy. No free fluid the pelvis. The uterus and ovaries, and bladder are normal. There is sclerosis within the posterior aspect of the right iliac bone (image 92, series 3).  IMPRESSION: 1. Acute pulmonary within the left lower lobe pulmonary arteries. Overall clot burden is mild to moderate. 2. Ill-defined mass within the proximal sigmoid colon / distal descending colon. No evidence of bowel obstruction. 3. 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 4. Tiny indeterminate 5 mm lesion within the central left hepatic lobe. 5. Sclerotic lesion in the posterior aspect of the right iliac bone is also  indeterminate but could be evaluated with FDG PET-CT scan.  Critical Value/emergent results were called by telephone at the time of interpretation on 01/06/2014 at 4:02 PM to Dr. Carol Ada , who verbally acknowledged these results.   Electronically Signed   By: Suzy Bouchard M.D.   On: 01/06/2014 16:09    Microbiology: No results found for this or any previous visit (from the past 240 hour(s)).   Labs: Basic Metabolic Panel:  Recent Labs Lab 01/06/14 2018 01/07/14 0530  NA 139 142  K 3.5* 3.4*  CL 102 104  CO2 22 22  GLUCOSE 90 100*  BUN 7 8  CREATININE 0.80 0.84  CALCIUM 9.8 9.6   Liver Function Tests:  Recent Labs Lab 01/06/14 2018 01/07/14 0530  AST 41* 39*  ALT 36* 34  ALKPHOS 43 42  BILITOT 0.5 0.6  PROT 8.0 7.9  ALBUMIN 3.2* 3.1*   No results found for this basename: LIPASE, AMYLASE,  in the last 168 hours No results found for this basename: AMMONIA,  in the last 168 hours CBC:  Recent Labs Lab 01/06/14 2018 01/07/14 0530 01/08/14 0500  WBC 11.9* 11.8* 8.7  NEUTROABS 6.9  --   --   HGB 13.5 13.1 12.3  HCT 39.3 39.3 37.7  MCV 91.4 92.3 92.9  PLT 272 289 259   Cardiac Enzymes:  Recent Labs Lab 01/07/14 0530  TROPONINI <0.30   BNP: BNP (last 3 results) No results found for this basename: PROBNP,  in the last 8760 hours CBG: No results found for this basename: GLUCAP,  in the last 168 hours     Signed:  [facility] 01/08/2014, 11:46 AM

## 2014-01-08 NOTE — Progress Notes (Signed)
CARE MANAGEMENT NOTE 01/08/2014  Patient:  Veronica Reyes,Veronica Reyes   Account Number:  [id number]  Date Initiated:  01/08/2014  Documentation initiated by:  [facility]  Subjective/Objective Assessment:   adm: bright red blood per rectum with bowel movements     Action/Plan:   discharge planning   Anticipated DC Date:  01/08/2014   Anticipated DC Plan:  [facility]  CM consult  Medication Assistance      Choice offered to / List presented to:             Status of service:  Completed, signed off Medicare Important Message given?   (If response is "NO", the following Medicare IM given date fields will be blank) Date Medicare IM given:   Date Additional Medicare IM given:    Discharge Disposition:  HOME/SELF CARE  Per UR Regulation:    If discussed at Long Length of Stay Meetings, dates discussed:    Comments:  01/08/14 15:00 CM was called by RN for med asst at 12:00. CM noted pt has BCBS and not a candidate for a MATCH.  Pt tried to fill herenoxaparin (lovenox) prescription and was told it would be $3,200.00.  Pt came back to unit.  CM contacted MD for substitute but pt unable to take as her CA contraindicates substitution.  CM requested generic; however, it was the generic which cost $3,200.00.  Pt requires 60 injections (q12 x 30 days).  CM contacted pharmacy who stated they will fill for 5 days with prescription.  CM delivered prescription and then delivered the 10 units to pt's husband.  Pt has appt tomorrow with [facility] which may result in a re-hsopitalization and negate further action at this time.  But this CM will contact the [facility] CM to inform and investigate other sources for continued lovenox needs.  Mariane Masters, BSN, CM [phone].

## 2014-01-09 ENCOUNTER — Encounter (INDEPENDENT_AMBULATORY_CARE_PROVIDER_SITE_OTHER): Payer: Self-pay | Admitting: General Surgery

## 2014-01-09 ENCOUNTER — Ambulatory Visit
Admission: RE | Admit: 2014-01-09 | Discharge: 2014-01-09 | Disposition: A | Discharge status: Home / Self Care | Payer: BC Managed Care – PPO | Source: Ambulatory Visit | Attending: Nurse Practitioner | Admitting: Nurse Practitioner

## 2014-01-09 ENCOUNTER — Ambulatory Visit (INDEPENDENT_AMBULATORY_CARE_PROVIDER_SITE_OTHER): Payer: BC Managed Care – PPO | Admitting: General Surgery

## 2014-01-09 VITALS — BP 132/78 | HR 84 | Temp 98.0°F | Resp 18 | Ht 66.0 in | Wt 301.6 lb

## 2014-01-09 DIAGNOSIS — C189 Malignant neoplasm of colon, unspecified: Secondary | ICD-10-CM

## 2014-01-09 DIAGNOSIS — Z853 Personal history of malignant neoplasm of breast: Secondary | ICD-10-CM

## 2014-01-09 MED ORDER — METRONIDAZOLE 500 MG PO TABS: 500.0000 mg | ORAL_TABLET | ORAL | Status: DC

## 2014-01-09 NOTE — Progress Notes (Signed)
Chief Complaint  Patient presents with  . Establish Care    adencariconma    HISTORY: Veronica Reyes is a 58 y.o. female who presents to the office with colon cancer.  This was found on colonoscopy performed by Dr Benson Norway for rectal bleeding. This descending colon mass was biopsied and showed at least intramucosal adenocarcinoma.  The mass was tattooed.  Workup thus far has included CT scans, which show a possible enlarged lymph node and a small liver lesion.  The CT chest also showed a PE.  She was just discharged from the hospital for this.  She is taking BID therapeutic Lovenox injections for this for the next 6 weeks.  She is s/p breast cancer with resection by Dr Margot Chimes in 2011.       Past Medical History  Diagnosis Date  . Hypertension   . Cancer   . Breast CA       Past Surgical History  Procedure Laterality Date  . Cesarean section        Current Outpatient Prescriptions  Medication Sig Dispense Refill  . enoxaparin (LOVENOX) 150 MG/ML injection Inject 0.91 mLs (135 mg total) into the skin every 12 (twelve) hours.  10 Syringe  1  . hydrochlorothiazide (HYDRODIURIL) 25 MG tablet Take 25 mg by mouth every morning.       . Ibuprofen-Diphenhydramine Cit (ADVIL PM PO) Take 2 tablets by mouth at bedtime as needed (sleep).      . metoprolol (LOPRESSOR) 50 MG tablet Take 50 mg by mouth 2 (two) times daily.      Marland Kitchen POTASSIUM PO Take 1 tablet by mouth 2 (two) times daily.      . metroNIDAZOLE (FLAGYL) 500 MG tablet Take 1 tablet (500 mg total) by mouth as directed.  3 tablet  0   No current facility-administered medications for this visit.      No Known Allergies    Family History  Problem Relation Age of Onset  . Hypertension Sister   . Cancer Cousin       History   Social History  . Marital Status: Married    Spouse Name: N/A    Number of Children: N/A  . Years of Education: N/A   Social History Main Topics  . Smoking status: Never Smoker   . Smokeless  tobacco: Not on file  . Alcohol Use: No  . Drug Use: No  . Sexual Activity: Yes    Birth Control/ Protection: Post-menopausal   Other Topics Concern  . Not on file   Social History Narrative  . No narrative on file       REVIEW OF SYSTEMS - PERTINENT POSITIVES ONLY: Review of Systems - General ROS: negative for - chills or fever Respiratory ROS: no cough, shortness of breath, or wheezing Cardiovascular ROS: no chest pain or dyspnea on exertion Gastrointestinal ROS: no abdominal pain, change in bowel habits, or black or bloody stools Genito-Urinary ROS: no dysuria, trouble voiding, or hematuria  EXAM: Filed Vitals:   01/09/14 1614  BP: 132/78  Pulse: 84  Temp: 98 F (36.7 C)  Resp: 18    Gen:  No acute distress.  Well nourished and well groomed.   Neurological: Alert and oriented to person, place, and time. Coordination normal.  Head: Normocephalic and atraumatic.  Eyes: Conjunctivae are normal. Pupils are equal, round, and reactive to light. No scleral icterus.  Neck: Normal range of motion. Neck supple. No tracheal deviation or thyromegaly present.  No cervical lymphadenopathy. Cardiovascular:  Normal rate, regular rhythm, normal heart sounds and intact distal pulses. Respiratory: Effort normal.  No respiratory distress. No chest wall tenderness. Breath sounds normal.  No wheezes, rales or rhonchi.  GI: Soft. Bowel sounds are normal. The abdomen is soft and nontender.  There is no rebound and no guarding.  Musculoskeletal: Normal range of motion. Extremities are nontender.  Skin: Skin is warm and dry. No rash noted. No diaphoresis. No erythema. No pallor. No clubbing, cyanosis, or edema.   Psychiatric: Normal mood and affect. Behavior is normal. Judgment and thought content normal.     LABORATORY RESULTS: Lab Results  Component Value Date   WBC 8.7 01/08/2014   HGB 12.3 01/08/2014   HCT 37.7 01/08/2014   MCV 92.9 01/08/2014   PLT 259 01/08/2014    Lab Results   Component Value Date   CREATININE 0.84 01/07/2014    No results found for this basename: CEA     RADIOLOGY RESULTS:   Images and reports are reviewed. CT CHEST, ABD AND PELVIS IMPRESSION:  1. Acute pulmonary within the left lower lobe pulmonary arteries.  Overall clot burden is mild to moderate.  2. Ill-defined mass within the proximal sigmoid colon / distal descending colon. No evidence of bowel obstruction.  3. 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  4. Tiny indeterminate 5 mm lesion within the central left hepatic lobe.  5. Sclerotic lesion in the posterior aspect of the right iliac bone is also indeterminate but could be evaluated with FDG PET-CT scan.   ASSESSMENT AND PLAN:  Veronica Reyes This is a 58 year old female who presents to the office with a new diagnosis of colon cancer. Metastatic workup has not shown any obvious signs of metastatic disease, although there is a enlarged lymph node around the area of descending colon. He also has an indeterminate liver lesion and a sclerotic lesion in the right iliac bone. She also has a diagnosis of pulmonary embolism and is on Lovenox subcutaneous for this. I recommended that she undergo a laparoscopic partial colectomy. We should be able to get her back on the Lovenox rather quickly after surgery.  I will have her get a CEA level drawn today to complete her metastatic workup. The surgery and anatomy were described to the patient as well as the risks of surgery and the possible complications.  These include: Bleeding, infection and possible wound complications such as hernia, damage to adjacent structures, leak of surgical connections, which can lead to other surgeries and possibly an ostomy (<1%), possible need for other procedures, such as abscess drains in radiology, possible prolonged hospital stay, possible diarrhea from removal of part of the colon, possible constipation from  narcotics, prolonged fatigue/weakness or appetite loss, possible early recurrence of cancer, possible complications of their medical problems such as heart disease or arrhythmias or lung problems, death (less than 1%). I believe the patient understands and wishes to proceed with the surgery.    Rosario Adie, MD Colon and Rectal Surgery / [facility]      Visit Diagnoses: 1. Colon cancer     Primary Care Physician: Vicenta Aly, FNP

## 2014-01-09 NOTE — Patient Instructions (Signed)
Colorectal Cancer  Colorectal cancer is the second most common cancer in the [place], striking 140,000 people annually and causing 60,000 deaths. That's a staggering figure when you consider the disease is potentially curable if diagnosed in the early stages. Who is at risk? Though colorectal cancer may occur at any age, more than 90% of the patients are over age 57, at which point the risk doubles every ten years. In addition to age, other high risk factors include a family history of colorectal cancer and polyps and a personal history of ulcerative colitis, colon polyps or cancer of other organs, especially of the breast or uterus. How does it start? It is generally agreed that nearly all colon and rectal cancer begins in benign polyps. These pre-malignant growths occur on the bowel wall and may eventually increase in size and become cancer. Removal of benign polyps is one aspect of preventive medicine that really works! What are the symptoms? The most common symptoms are rectal bleeding and changes in bowel habits, such as constipation or diarrhea. (These symptoms are also common in other diseases so it is important you receive a thorough examination should you experience them.) Abdominal pain and weight loss are usually late symptoms indicating possible extensive disease. Unfortunately, many polyps and early cancers fail to produce symptoms. Therefore, it is important that your routine physical includes colorectal cancer detection procedures once you reach age 50.  There are several methods for detection of colorectal cancer. These include digital rectal examination, a chemical test of the stool for blood, flexible sigmoidoscopy and colonoscopy (lighted tubular instruments used to inspect the lower bowel) and barium enema. Be sure to discuss these options with your surgeon to determine which procedure is best for you. Individuals who have a first-degree relative (parent or sibling) with colon  cancer or polyps should start their colon cancer screening at the age of 57. How is colorectal cancer treated? Colorectal cancer requires surgery in nearly all cases for complete cure. Radiation and chemotherapy are sometimes used in addition to surgery. Between 80-90% are restored to normal health if the cancer is detected and treated in the earliest stages. The cure rate drops to 50% or less when diagnosed in the later stages. Thanks to modern technology, less than 5% of all colorectal cancer patients require a colostomy, the surgical construction of an artificial excretory opening from the colon. Can colon cancer be prevented? Colon cancer is very preventable. The most important step towards preventing colon cancer is getting a screening test. Any abnormal screening test should be followed by a colonoscopy. Some individuals prefer to start with colonoscopy as a screening test. Colonoscopy provides a detailed examination of the bowel. Polyps can be identified and can often be removed during colonoscopy. Though not definitely proven, there is some evidence that diet may play a significant role in preventing colorectal cancer. As far as we know, a high fiber, low fat diet is the only dietary measure that might help prevent colorectal cancer. Finally, pay attention to changes in your bowel habits. Any new changes such as persistent constipation, diarrhea, or blood in the stool should be discussed with your physician.   Can hemorrhoids lead to colon cancer? No, but hemorrhoids may produce symptoms similar to colon polyps or cancer. Should you experience these symptoms, you should have them examined and evaluated by a physician, preferably by a colon and rectal surgeon. What is a colon and rectal surgeon? Colon and rectal surgeons are experts in the surgical and non-surgical treatment   of diseases of the colon, rectum and anus. They have completed advanced surgical training in the treatment of these  diseases as well as full general surgical training. Board-certified colon and rectal surgeons complete residencies in general surgery and colon and rectal surgery, and pass intensive examinations conducted by the American Board of Surgery and the American Board of Colon and Rectal Surgery. They are well-versed in the treatment of both benign and malignant diseases of the colon, rectum and anus and are able to perform routine screening examinations and surgically treat conditions if indicated to do so.  2012 American Society of Colon & Rectal Surgeons     [facility]  ONE-DAY (1) PRE-OP HOME COLON PREP INSTRUCTIONS: ** MIRALAX / GATORADE PREP / FLAGYL**  You must follow the instructions below carefully.  If you have questions or problems, please call and speak to someone in the clinic department at our office:   [phone].     INSTRUCTIONS: 1. Five days prior to your procedure do not eat nuts, popcorn, or fruit with seeds.  Stop all fiber supplements such as Metamucil, Citrucel, etc. 2. Two days before surgery fill the prescription at a pharmacy of your choice and purchase the additional supplies below.         MIRALAX - GATORADE -- DULCOLAX TABS -- FLEET ENEMA:   Purchase a bottle of MIRALAX  (255 gm bottle)    In addition, purchase four (4) DULCOLAX TABLETS (no prescription required- ask the pharmacist if you can't find them)   Purchase one Fleet Enema (Green and white box)    Purchase one 64 oz GATORADE.  (Do NOT purchase red Gatorade; any other flavor is acceptable) and place in refrigerator to get cold.  3.   Day Before Surgery:   6 am: Wash you abdomen with soap and repeat this on the morning of surgery and take 4 Dulcolax tablets   You may only have clear liquids (tea, coffee, juice, broth, jello, soft drinks, gummy bears).  You cannot have solid foods, cream, milk or milk products.  Drink at lease 8 ounces of liquids every hour while awake.   Take the Flagyl prescription  as directed at 8 am, 2 pm and 8 pm.  It is helpful to take this with some jello instead of on an empty stomach.  Any flavor is ok, except red jello, which will cause red stools.   Mix the entire bottle of MiraLax and the Gatorade in a large container.    10:00am: Begin drinking the Gatorade mixture until gone (8 oz every 15-30 minutes).      You may suck on a lime wedge or hard candy to "freshen your palate" in between glasses   If you are a diabetic, take your blood sugar reading several time throughout the prep.  Have some juice available to take if your sugar level gets too low   You may feel chilled while taking the prep.  Have some warm tea or broth to help warm up.   Continue clear liquids until midnight or bedtime  3. The day of your procedure:   Do not eat or drink ANYTHING after midnight before your surgery.     If you take Heart or Blood Pressure medicine, ask the pre-op nurses about these during your preop appointment.   Take a regular Fleet Enema one hour before leaving the come to the hospital.  Try to hold it in 5-10 mins before expelling.   Further pre-operative instructions will be   given to you from the hospital.   Expect to be contacted 5-7 days before your surgery.     

## 2014-01-13 NOTE — Progress Notes (Signed)
Follow up note:  Patient was discharged on Lovenox, given a 5-day supply from pharmacy. She is being anticoagulated with Lovenox because of PE in setting of malignancy. Spoke with CM, unfortunately she did not qualify for Lovenox Assistance Program due to having prescription coverage however pharmacy notified me that under her current plan she would be faced with a copay near $4,000, which patient stated was not an option for her. A script for Xarelto was called in as pharmacy notified me she could use the 1 month coupon to cover this month. She was instructed to follow up with oncology to discuss further options.

## 2014-01-19 ENCOUNTER — Other Ambulatory Visit (INDEPENDENT_AMBULATORY_CARE_PROVIDER_SITE_OTHER): Payer: Self-pay

## 2014-01-19 LAB — CEA: CEA: 0.8 ng/mL (ref 0.0–5.0)

## 2014-02-08 ENCOUNTER — Encounter (INDEPENDENT_AMBULATORY_CARE_PROVIDER_SITE_OTHER): Payer: Self-pay

## 2014-02-22 ENCOUNTER — Encounter (INDEPENDENT_AMBULATORY_CARE_PROVIDER_SITE_OTHER): Payer: Self-pay

## 2014-02-27 ENCOUNTER — Encounter (HOSPITAL_COMMUNITY): Payer: Self-pay | Admitting: Pharmacy Technician

## 2014-03-02 NOTE — Patient Instructions (Addendum)
[place]  03/03/2014   Your procedure is scheduled on:  6-25 -2015  Enter through [facility] Entrance and follow signs to [facility]. Arrive at      0530  AM .  Call this number if you have problems the morning of surgery: [phone]  Or Presurgical Testing [phone]([place]) For Living Will and/or Health Care Power Attorney Forms: please provide copy for your medical record,may bring AM of surgery(Forms should be already notarized -we do not provide this service).(03-03-14 No information preferred today).  Remember: Follow any bowel prep instructions per MD office.    Do not eat food:After Midnight.  .  Take these medicines the morning of surgery with A SIP OF WATER: Metoprolol. Use Xarelto as per MD office instructions.   Do not wear jewelry, make-up or nail polish.  Do not wear lotions, powders, or perfumes. You may wear deodorant.  Do not shave 48 hours(2 days) prior to first CHG shower(legs and under arms).(Shaving face and neck okay.)  Do not bring valuables to the hospital.(Hospital is not responsible for lost valuables).  Contacts, dentures or removable bridgework, body piercing, hair pins may not be worn into surgery.  Leave suitcase in the car. After surgery it may be brought to your room.  For patients admitted to the hospital, checkout time is 11:00 AM the day of discharge.(Restricted visitors-Any Persons displaying flu-like symptoms or illness).    Patients discharged the day of surgery will not be allowed to drive home. Must have responsible person with you x 24 hours once discharged.  Name and phone number of your driver:  Aubery Lapping -spouse -[phone] cell Special Instructions: CHG(Chlorhedine 4%-"Hibiclens","Betasept","Aplicare") Shower Use Special Wash: see special instructions.(avoid face and genitals)    Remember : Type/Screen "Blue armbands" - may not be removed once applied(would result in being retested AM of surgery, if  removed).  Failure to follow these instructions may result in Cancellation of your surgery.   ________________    [facility] - Preparing for Surgery Before surgery, you can play an important role.  Because skin is not sterile, your skin needs to be as free of germs as possible.  You can reduce the number of germs on your skin by washing with CHG (chlorahexidine gluconate) soap before surgery.  CHG is an antiseptic cleaner which kills germs and bonds with the skin to continue killing germs even after washing. Please DO NOT use if you have an allergy to CHG or antibacterial soaps.  If your skin becomes reddened/irritated stop using the CHG and inform your nurse when you arrive at Short Stay. Do not shave (including legs and underarms) for at least 48 hours prior to the first CHG shower.  You may shave your face/neck. Please follow these instructions carefully:  1.  Shower with CHG Soap the night before surgery and the  morning of Surgery.  2.  If you choose to wash your hair, wash your hair first as usual with your  normal  shampoo.  3.  After you shampoo, rinse your hair and body thoroughly to remove the  shampoo.                           4.  Use CHG as you would any other liquid soap.  You can apply chg directly  to the skin and wash  Gently with a scrungie or clean washcloth.  5.  Apply the CHG Soap to your body ONLY FROM THE NECK DOWN.   Do not use on face/ open                           Wound or open sores. Avoid contact with eyes, ears mouth and genitals (private parts).                       Wash face,  Genitals (private parts) with your normal soap.             6.  Wash thoroughly, paying special attention to the area where your surgery  will be performed.  7.  Thoroughly rinse your body with warm water from the neck down.  8.  DO NOT shower/wash with your normal soap after using and rinsing off  the CHG Soap.                9.  Pat yourself dry with a clean towel.             10.  Wear clean pajamas.            11.  Place clean sheets on your bed the night of your first shower and do not  sleep with pets. Day of Surgery : Do not apply any lotions/deodorants the morning of surgery.  Please wear clean clothes to the hospital/surgery center.  FAILURE TO FOLLOW THESE INSTRUCTIONS MAY RESULT IN THE CANCELLATION OF YOUR SURGERY PATIENT SIGNATURE_________________________________  NURSE SIGNATURE__________________________________  ________________________________________________________________________

## 2014-03-03 ENCOUNTER — Other Ambulatory Visit: Payer: Self-pay

## 2014-03-03 ENCOUNTER — Encounter (HOSPITAL_COMMUNITY): Payer: Self-pay

## 2014-03-03 ENCOUNTER — Encounter (HOSPITAL_COMMUNITY)
Admission: RE | Admit: 2014-03-03 | Discharge: 2014-03-03 | Disposition: A | Discharge status: Home / Self Care | Payer: BC Managed Care – PPO | Source: Ambulatory Visit | Attending: General Surgery | Admitting: General Surgery

## 2014-03-03 ENCOUNTER — Telehealth (INDEPENDENT_AMBULATORY_CARE_PROVIDER_SITE_OTHER): Payer: Self-pay

## 2014-03-03 ENCOUNTER — Emergency Department (HOSPITAL_COMMUNITY): Payer: BC Managed Care – PPO

## 2014-03-03 ENCOUNTER — Inpatient Hospital Stay (HOSPITAL_COMMUNITY)
Admission: EM | Admit: 2014-03-03 | Discharge: 2014-03-14 | DRG: 982 | Disposition: A | Discharge status: Home Health | Payer: BC Managed Care – PPO | Attending: General Surgery | Admitting: General Surgery

## 2014-03-03 ENCOUNTER — Encounter (HOSPITAL_COMMUNITY): Payer: Self-pay | Admitting: Emergency Medicine

## 2014-03-03 DIAGNOSIS — N390 Urinary tract infection, site not specified: Secondary | ICD-10-CM | POA: Diagnosis present

## 2014-03-03 DIAGNOSIS — N921 Excessive and frequent menstruation with irregular cycle: Secondary | ICD-10-CM | POA: Diagnosis present

## 2014-03-03 DIAGNOSIS — K921 Melena: Secondary | ICD-10-CM | POA: Diagnosis present

## 2014-03-03 DIAGNOSIS — C189 Malignant neoplasm of colon, unspecified: Secondary | ICD-10-CM

## 2014-03-03 DIAGNOSIS — I10 Essential (primary) hypertension: Secondary | ICD-10-CM | POA: Diagnosis present

## 2014-03-03 DIAGNOSIS — Z6841 Body Mass Index (BMI) 40.0 and over, adult: Secondary | ICD-10-CM

## 2014-03-03 DIAGNOSIS — N920 Excessive and frequent menstruation with regular cycle: Secondary | ICD-10-CM | POA: Diagnosis present

## 2014-03-03 DIAGNOSIS — Z86711 Personal history of pulmonary embolism: Secondary | ICD-10-CM

## 2014-03-03 DIAGNOSIS — Z8673 Personal history of transient ischemic attack (TIA), and cerebral infarction without residual deficits: Secondary | ICD-10-CM

## 2014-03-03 DIAGNOSIS — Z823 Family history of stroke: Secondary | ICD-10-CM

## 2014-03-03 DIAGNOSIS — D500 Iron deficiency anemia secondary to blood loss (chronic): Secondary | ICD-10-CM

## 2014-03-03 DIAGNOSIS — Z853 Personal history of malignant neoplasm of breast: Secondary | ICD-10-CM

## 2014-03-03 DIAGNOSIS — Z923 Personal history of irradiation: Secondary | ICD-10-CM

## 2014-03-03 DIAGNOSIS — D62 Acute posthemorrhagic anemia: Principal | ICD-10-CM | POA: Diagnosis present

## 2014-03-03 DIAGNOSIS — Z8249 Family history of ischemic heart disease and other diseases of the circulatory system: Secondary | ICD-10-CM

## 2014-03-03 DIAGNOSIS — Q619 Cystic kidney disease, unspecified: Secondary | ICD-10-CM

## 2014-03-03 DIAGNOSIS — R9389 Abnormal findings on diagnostic imaging of other specified body structures: Secondary | ICD-10-CM | POA: Diagnosis present

## 2014-03-03 DIAGNOSIS — D259 Leiomyoma of uterus, unspecified: Secondary | ICD-10-CM

## 2014-03-03 DIAGNOSIS — B9620 Unspecified Escherichia coli [E. coli] as the cause of diseases classified elsewhere: Secondary | ICD-10-CM

## 2014-03-03 DIAGNOSIS — I2699 Other pulmonary embolism without acute cor pulmonale: Secondary | ICD-10-CM

## 2014-03-03 DIAGNOSIS — N939 Abnormal uterine and vaginal bleeding, unspecified: Secondary | ICD-10-CM

## 2014-03-03 DIAGNOSIS — R000 Tachycardia, unspecified: Secondary | ICD-10-CM | POA: Diagnosis present

## 2014-03-03 DIAGNOSIS — Z9089 Acquired absence of other organs: Secondary | ICD-10-CM

## 2014-03-03 DIAGNOSIS — Z79899 Other long term (current) drug therapy: Secondary | ICD-10-CM

## 2014-03-03 DIAGNOSIS — D509 Iron deficiency anemia, unspecified: Secondary | ICD-10-CM | POA: Insufficient documentation

## 2014-03-03 DIAGNOSIS — N898 Other specified noninflammatory disorders of vagina: Secondary | ICD-10-CM | POA: Diagnosis present

## 2014-03-03 DIAGNOSIS — C50511 Malignant neoplasm of lower-outer quadrant of right female breast: Secondary | ICD-10-CM | POA: Diagnosis present

## 2014-03-03 DIAGNOSIS — N289 Disorder of kidney and ureter, unspecified: Secondary | ICD-10-CM | POA: Diagnosis present

## 2014-03-03 DIAGNOSIS — C50919 Malignant neoplasm of unspecified site of unspecified female breast: Secondary | ICD-10-CM

## 2014-03-03 DIAGNOSIS — K625 Hemorrhage of anus and rectum: Secondary | ICD-10-CM

## 2014-03-03 DIAGNOSIS — D219 Benign neoplasm of connective and other soft tissue, unspecified: Secondary | ICD-10-CM | POA: Diagnosis present

## 2014-03-03 DIAGNOSIS — Z7902 Long term (current) use of antithrombotics/antiplatelets: Secondary | ICD-10-CM

## 2014-03-03 DIAGNOSIS — A498 Other bacterial infections of unspecified site: Secondary | ICD-10-CM | POA: Diagnosis present

## 2014-03-03 DIAGNOSIS — D649 Anemia, unspecified: Secondary | ICD-10-CM | POA: Diagnosis present

## 2014-03-03 DIAGNOSIS — C187 Malignant neoplasm of sigmoid colon: Secondary | ICD-10-CM | POA: Diagnosis present

## 2014-03-03 DIAGNOSIS — D251 Intramural leiomyoma of uterus: Secondary | ICD-10-CM | POA: Diagnosis present

## 2014-03-03 HISTORY — DX: Shortness of breath: R06.02

## 2014-03-03 HISTORY — DX: Other pulmonary embolism without acute cor pulmonale: I26.99

## 2014-03-03 LAB — HEPATIC FUNCTION PANEL
ALT: 30 U/L (ref 0–35)
AST: 37 U/L (ref 0–37)
Albumin: 2.9 g/dL — ABNORMAL LOW (ref 3.5–5.2)
Alkaline Phosphatase: 38 U/L — ABNORMAL LOW (ref 39–117)
BILIRUBIN TOTAL: 0.2 mg/dL — AB (ref 0.3–1.2)
Total Protein: 6.8 g/dL (ref 6.0–8.3)

## 2014-03-03 LAB — CBC
HEMATOCRIT: 19.9 % — AB (ref 36.0–46.0)
Hemoglobin: 5.8 g/dL — CL (ref 12.0–15.0)
MCH: 27.9 pg (ref 26.0–34.0)
MCHC: 29.1 g/dL — ABNORMAL LOW (ref 30.0–36.0)
MCV: 95.7 fL (ref 78.0–100.0)
PLATELETS: 391 10*3/uL (ref 150–400)
RBC: 2.08 MIL/uL — AB (ref 3.87–5.11)
RDW: 16.3 % — ABNORMAL HIGH (ref 11.5–15.5)
WBC: 10.9 10*3/uL — ABNORMAL HIGH (ref 4.0–10.5)

## 2014-03-03 LAB — BASIC METABOLIC PANEL
BUN: 7 mg/dL (ref 6–23)
CHLORIDE: 97 meq/L (ref 96–112)
CO2: 25 mEq/L (ref 19–32)
CREATININE: 0.76 mg/dL (ref 0.50–1.10)
Calcium: 9.2 mg/dL (ref 8.4–10.5)
GFR calc non Af Amer: >90 mL/min (ref >90)
Glucose, Bld: 105 mg/dL — ABNORMAL HIGH (ref 70–99)
Potassium: 3.3 mEq/L — ABNORMAL LOW (ref 3.7–5.3)
Sodium: 135 mEq/L — ABNORMAL LOW (ref 137–147)

## 2014-03-03 LAB — POC OCCULT BLOOD, ED: FECAL OCCULT BLD: POSITIVE — AB

## 2014-03-03 LAB — PROTIME-INR
INR: 1.76 — ABNORMAL HIGH (ref 0.00–1.49)
Prothrombin Time: 20 seconds — ABNORMAL HIGH (ref 11.6–15.2)

## 2014-03-03 LAB — ABO/RH: ABO/RH(D): A POS

## 2014-03-03 LAB — APTT: aPTT: 30 seconds (ref 24–37)

## 2014-03-03 LAB — PREPARE RBC (CROSSMATCH)

## 2014-03-03 MED ORDER — PANTOPRAZOLE SODIUM 40 MG IV SOLR
40.0000 mg | Freq: Two times a day (BID) | INTRAVENOUS | Status: DC
  Administered 2014-03-03 – 2014-03-04 (×2): 40 mg via INTRAVENOUS
  Filled 2014-03-03 (×3): qty 40

## 2014-03-03 MED ORDER — METOPROLOL TARTRATE 25 MG PO TABS
50.0000 mg | ORAL_TABLET | Freq: Two times a day (BID) | ORAL | Status: DC
  Administered 2014-03-03 – 2014-03-09 (×12): 50 mg via ORAL
  Filled 2014-03-03 (×14): qty 1

## 2014-03-03 MED ORDER — DIPHENHYDRAMINE-APAP (SLEEP) 25-500 MG PO TABS: 1.0000 | ORAL_TABLET | Freq: Every evening | ORAL | Status: DC | PRN

## 2014-03-03 MED ORDER — HEPARIN (PORCINE) IN NACL 100-0.45 UNIT/ML-% IJ SOLN
1400.0000 [IU]/h | INTRAMUSCULAR | Status: DC
  Filled 2014-03-03: qty 250

## 2014-03-03 MED ORDER — ONDANSETRON HCL 4 MG PO TABS: 4.0000 mg | ORAL_TABLET | Freq: Four times a day (QID) | ORAL | Status: DC | PRN

## 2014-03-03 MED ORDER — ACETAMINOPHEN 650 MG RE SUPP: 650.0000 mg | Freq: Four times a day (QID) | RECTAL | Status: DC | PRN

## 2014-03-03 MED ORDER — FERROUS SULFATE 325 (65 FE) MG PO TABS
325.0000 mg | ORAL_TABLET | Freq: Every morning | ORAL | Status: DC
  Administered 2014-03-04 – 2014-03-05 (×2): 325 mg via ORAL
  Filled 2014-03-03 (×2): qty 1

## 2014-03-03 MED ORDER — ACETAMINOPHEN 325 MG PO TABS
650.0000 mg | ORAL_TABLET | Freq: Four times a day (QID) | ORAL | Status: DC | PRN
  Administered 2014-03-04: 650 mg via ORAL
  Filled 2014-03-03: qty 2

## 2014-03-03 MED ORDER — SODIUM CHLORIDE 0.9 % IJ SOLN
3.0000 mL | Freq: Two times a day (BID) | INTRAMUSCULAR | Status: DC
  Administered 2014-03-03 – 2014-03-06 (×7): 3 mL via INTRAVENOUS

## 2014-03-03 MED ORDER — ZOLPIDEM TARTRATE 5 MG PO TABS
5.0000 mg | ORAL_TABLET | Freq: Every evening | ORAL | Status: DC | PRN
  Filled 2014-03-03: qty 1

## 2014-03-03 MED ORDER — ONDANSETRON HCL 4 MG/2ML IJ SOLN: 4.0000 mg | Freq: Four times a day (QID) | INTRAMUSCULAR | Status: DC | PRN

## 2014-03-03 NOTE — ED Notes (Signed)
Lab orders added - pt getting transfusion - cant draw labs.  Main lab phlebotomy notified.

## 2014-03-03 NOTE — Progress Notes (Signed)
03-03-14 1620- Critical lab value Hgb 5.8 reported by "Tersa" -in lab. 1625 Report of Critical value Hgb 5.8 reported by me to" Burnie", triage Rn of [facility]-she is to inform Dr. Marcello Moores.

## 2014-03-03 NOTE — Progress Notes (Addendum)
ANTICOAGULATION CONSULT NOTE - Initial Consult  Pharmacy Consult for Heparin Indication: VTE treatment  No Known Allergies  Patient Measurements:   Heparin Dosing Weight: 89kg  Vital Signs: Temp: 99.3 F (37.4 C) (06/19 2110) Temp src: Oral (06/19 2110) BP: 136/90 mmHg (06/19 2110) Pulse Rate: 94 (06/19 2100)  Labs:  Recent Labs  03/03/14 1510 03/03/14 1829  HGB 5.8*  --   HCT 19.9*  --   PLT 391  --   APTT  --  30  LABPROT  --  20.0*  INR  --  1.76*  CREATININE 0.76  --     The CrCl is unknown because both a height and weight (above a minimum accepted value) are required for this calculation.   Medical History: Past Medical History  Diagnosis Date  . Hypertension   . Cancer     left breast cancer x2  . Breast CA     radiation and surgery  . Shortness of breath     sob with exertion. dx. with Pulmonary emboli 4'15 ,tx. Xarelto,Lovenox used for [employer].  . Pulmonary embolism     "blood clot in lungs" -dx. 4'15 with CT Chest.    Medications:  Scheduled:  . [START ON 03/04/2014] ferrous sulfate  325 mg Oral q morning - 10a  . metoprolol  50 mg Oral BID  . pantoprazole (PROTONIX) IV  40 mg Intravenous Q12H  . sodium chloride  3 mL Intravenous Q12H   Infusions:   PRN: acetaminophen, acetaminophen, diphenhydramine-acetaminophen, ondansetron (ZOFRAN) IV, ondansetron  Assessment: 70 yof h/o PE in April 2015 and breast cancer. Here with rectal and vaginal bleeding (Hgb 5.8) on xarelto, last dose 7Am today. Pharmacy consulted to dose Heparin infusion for VTE treatment.  Dr. Posey Pronto prefers heparin drip without bolus.    Goal of Therapy:  Heparin level 0.3-0.7 units/ml Monitor platelets by anticoagulation protocol: Yes   Plan:  Start Heparin infusion at 1400 units/hr on 6/20 0700 (24 hours after last xarelto dose) Obtain Heparin level 6 hours after initiation of infusion and adjust as indicated Follow daily heparin levels & CBC  Kizzie Furnish, PharmD Pager:  [phone] 03/03/2014 9:51 PM

## 2014-03-03 NOTE — H&P (Addendum)
[facility] History and Physical  Patient: Veronica Reyes  YNW:[id number]  DOB: 05-14-1956  DOS: the patient was seen and examined on 03/03/2014 PCP: [name]., FNP  Chief Complaint: Abnormal lab  HPI: Veronica Reyes is a 58 y.o. female with Past medical history of hypertension, breast cancer, colon cancer or, as, pulmonary embolism in April 15. Patient presented with complaints of abnormal lab. She has a colonic mass for which she has been following up with surgery. Surgery scheduled her for colectomy on June 25 and today she was scheduled for preop blood work. Her hemoglobin was found to be 5.8 on that and the patient was referred to ER. On further history the patient mentions that she has been having heavy menstrual bleeding since last one month. She mentions she gets her periods every 3-4 months there regularly which last for longer than normal. But this time her bleeding was more heavier than before. Since last one week the bleeding has been gradually getting lighter with no clots. She denies any abdominal pain, fever, chills, nausea, vomiting, acid reflux, diarrhea. She continues to have some blood in her bowels which is bright red and it is unchanged from the time of her being on anticoagulation. Since last one week she has also been having generalized weakness and tiredness on exertion. No recent change in her medications.  she was discharged on Lovenox from the hospital but since it was possibly for her she was changed to Xarelto as an outpatient.  The patient is coming from home. And at her baseline independent for most of her ADL.  Review of Systems: as mentioned in the history of present illness.  A Comprehensive review of the other systems is negative.  Past Medical History  Diagnosis Date  . Hypertension   . Cancer     left breast cancer x2  . Breast CA     radiation and surgery  . Shortness of breath     sob with exertion. dx. with  Pulmonary emboli 4'15 ,tx. Xarelto,Lovenox used for [employer].  . Pulmonary embolism     "blood clot in lungs" -dx. 6'57 with CT Chest.   Past Surgical History  Procedure Laterality Date  . Cesarean section    . Breast surgery      '11- Left breast lumpectomy  . Cholecystectomy      Laparoscopic 20 yrs ago   Social History:  reports that she has never smoked. She has never used smokeless tobacco. She reports that she does not drink alcohol or use illicit drugs.  No Known Allergies  Family History  Problem Relation Age of Onset  . Hypertension Sister   . Cancer Cousin     Prior to Admission medications   Medication Sig Start Date End Date Taking? Authorizing Provider  diphenhydramine-acetaminophen (TYLENOL PM) 25-500 MG TABS Take 1 tablet by mouth at bedtime as needed (Sleep).   Yes Historical Provider, MD  ferrous sulfate 325 (65 FE) MG tablet Take 325 mg by mouth every morning.   Yes Historical Provider, MD  hydrochlorothiazide (HYDRODIURIL) 25 MG tablet Take 25 mg by mouth every morning.    Yes Historical Provider, MD  metoprolol (LOPRESSOR) 50 MG tablet Take 50 mg by mouth 2 (two) times daily.   Yes Historical Provider, MD  Multiple Vitamin (MULTIVITAMIN WITH MINERALS) TABS tablet Take 1 tablet by mouth daily.   Yes Historical Provider, MD  Rivaroxaban (XARELTO) 15 MG TABS tablet Take 15 mg by mouth 2 (two) times daily. To stop x  24 hours prior to surgery per Dr. Tawni Carnes Historical Provider, MD    Physical Exam: Filed Vitals:   03/03/14 2030 03/03/14 2045 03/03/14 2100 03/03/14 2110  BP: 125/70 121/72 135/70 136/90  Pulse: 92 92 94   Temp:    99.3 F (37.4 C)  TempSrc:    Oral  Resp: 19 21 16    SpO2: 100% 100% 100%     General: Alert, Awake and Oriented to Time, Place and Person. Appear in mild distress Eyes: PERRL ENT: Oral Mucosa clear moist. Neck: no JVD Cardiovascular: S1 and S2 Present, no Murmur, Peripheral Pulses Present Respiratory: Bilateral Air entry  equal and Decreased, Clear to Auscultation,  no Crackles,no wheezes Abdomen: Bowel Sound Present, Soft and Non tender Skin: no Rash Extremities: Bilateral Pedal edema, no calf tenderness Neurologic: Grossly no focal neuro deficit. Labs on Admission:  CBC:  Recent Labs Lab 03/03/14 1510  WBC 10.9*  HGB 5.8*  HCT 19.9*  MCV 95.7  PLT 391    CMP     Component Value Date/Time   NA 135* 03/03/2014 1510   K 3.3* 03/03/2014 1510   CL 97 03/03/2014 1510   CO2 25 03/03/2014 1510   GLUCOSE 105* 03/03/2014 1510   BUN 7 03/03/2014 1510   CREATININE 0.76 03/03/2014 1510   CALCIUM 9.2 03/03/2014 1510   PROT 6.8 03/03/2014 1831   ALBUMIN 2.9* 03/03/2014 1831   AST 37 03/03/2014 1831   ALT 30 03/03/2014 1831   ALKPHOS 38* 03/03/2014 1831   BILITOT 0.2* 03/03/2014 1831   GFRNONAA >90 03/03/2014 1510   GFRAA >90 03/03/2014 1510    No results found for this basename: LIPASE, AMYLASE,  in the last 168 hours No results found for this basename: AMMONIA,  in the last 168 hours  No results found for this basename: CKTOTAL, CKMB, CKMBINDEX, TROPONINI,  in the last 168 hours BNP (last 3 results) No results found for this basename: PROBNP,  in the last 8760 hours  Radiological Exams on Admission: Dg Chest Port 1 View  03/03/2014   CLINICAL DATA:  Shortness of breath with exertion  EXAM: PORTABLE CHEST - 1 VIEW  COMPARISON:  None.  FINDINGS: Moderate cardiac enlargement similar to prior study. Mild vascular congestion with no evidence of edema or consolidation.  IMPRESSION: Cardiac enlargement with minimal vascular congestion and no pulmonary edema   Electronically Signed   By: Skipper Cliche M.D.   On: 03/03/2014 19:07     Assessment/Plan Principal Problem:   Symptomatic anemia Active Problems:   Pulmonary embolism   Breast cancer   Essential hypertension, benign   History of BRBPR (bright red blood per rectum)   Colon cancer   1. Symptomatic anemia  patient presents with complaints of  generalized weakness tiredness needed she was initially tachycardic. She has had a menstrual period ongoing since last one month. She is on Xarelto. With that her hemoglobin is 5.8. Hemoccult is positive. Patient is currently being admitted on telemetry. She is currently receiving one unit of PRBC and she will receive a total of 2 units of PRBC. We will recheck her hemoglobin after that and transfuse her as needed to maintain hemoglobin of 08. She is on Xarelto which we would stop at present. Patient will be switched to happen if she does not have any continuous bleeding. She will be on clear liquid diet. Continue iron supplements. IV Protonix.  Check reticulocyte count, iron levels, B12, folate acid level, LDH, haptoglobin  2.  pulmonary embolism Patient has history of pulmonary embolism which is less than 38-month-old. Patient at present is asymptomatic with a pulmonary embolism. We will check lower extremity Doppler. And switch Xarelto to heparin when active bleeding is stopped.  3. hypertension Continue metoprolol hold hydrochlorothiazide  4. colon cancer Patient scheduled for colectomy as an outpatient, will monitor for bleeding per rectum, she has heavy bleeding we discussed with surgery in the hospital.  5. Menorrhagia  Ultrasound of the pelvis for further workup. At present conservative management with supportive treatment.  DVT Prophylaxis:mechanical compression device Nutrition:  Clear liquid diet  Code Status:  Full  Family Communication:  Family was present at bedside, opportunity was given to ask question and all questions were answered satisfactorily at the time of interview. Disposition: Admitted to inpatient in telemetry unit.  Author: Berle Mull, MD [facility] Pager: [phone] 03/03/2014, 9:33 PM    If 7PM-7AM, please contact night-coverage [email or web address] Password [email or web address]   Addendum At present holding happen due to persistent blood clots per vagina. PATEL,  PRANAV

## 2014-03-03 NOTE — ED Notes (Signed)
MD at bedside. EDP RAY PRESENT UPON ARRIVAL

## 2014-03-03 NOTE — ED Provider Notes (Signed)
CSN: [id number]     Arrival date & time 03/03/14  1750 History   First MD Initiated Contact with Patient 03/03/14 1809     No chief complaint on file.    (Consider location/radiation/quality/duration/timing/severity/associated sxs/prior Treatment) HPI 58 year old female who was sent in secondary to low hemoglobin found on preoperative laboratory testing. She is on xarelto for a pulmonary embolism and has a colon cancer. She states that since she started on the is xarelto she has had rectal bleeding and vaginal bleeding.  She states the has had decreased vaginal bleeding over the past several days but that it was heavy with blood clots. She has noticed blood in her stool since the end of April when she started the xarelto. She has not noted any increase in this. She states that she has some ongoing dyspnea on exertion. She denies any chest pain. She denies any lightheadedness. [facility] Past Medical History  Diagnosis Date  . Hypertension   . Cancer     left breast cancer x2  . Breast CA     radiation and surgery  . Shortness of breath     sob with exertion. dx. with Pulmonary emboli 4'15 ,tx. Xarelto,Lovenox used for [employer].  . Pulmonary embolism     "blood clot in lungs" -dx. 3'29 with CT Chest.   Past Surgical History  Procedure Laterality Date  . Cesarean section    . Breast surgery      '11- Left breast lumpectomy  . Cholecystectomy      Laparoscopic 20 yrs ago   Family History  Problem Relation Age of Onset  . Hypertension Sister   . Cancer Cousin    History  Substance Use Topics  . Smoking status: Never Smoker   . Smokeless tobacco: Not on file  . Alcohol Use: No   OB History   Grav Para Term Preterm Abortions TAB SAB Ect Mult Living   1         1     Review of Systems  All other systems reviewed and are negative.     Allergies  Review of patient's allergies indicates no known allergies.  Home Medications   Prior  to Admission medications   Medication Sig Start Date End Date Taking? Authorizing Provider  diphenhydramine-acetaminophen (TYLENOL PM) 25-500 MG TABS Take 1 tablet by mouth at bedtime as needed (Sleep).    Historical Provider, MD  ferrous sulfate 325 (65 FE) MG tablet Take 325 mg by mouth every morning.    Historical Provider, MD  hydrochlorothiazide (HYDRODIURIL) 25 MG tablet Take 25 mg by mouth every morning.     Historical Provider, MD  metoprolol (LOPRESSOR) 50 MG tablet Take 50 mg by mouth 2 (two) times daily.    Historical Provider, MD  Multiple Vitamin (MULTIVITAMIN WITH MINERALS) TABS tablet Take 1 tablet by mouth daily.    Historical Provider, MD  Rivaroxaban (XARELTO) 15 MG TABS tablet Take 15 mg by mouth 2 (two) times daily. To stop x 24 hours prior to surgery per Dr. Marcello Moores    Historical Provider, MD   There were no vitals taken for this visit. Physical Exam  Nursing note and vitals reviewed. Constitutional: She is oriented to person, place, and time. She appears well-developed and well-nourished.  HENT:  Head: Normocephalic and atraumatic.  Right Ear: External ear normal.  Left Ear: External ear normal.  Eyes:  Conjunctiva pale  Cardiovascular: Normal heart sounds and intact distal pulses.  Tachycardia present.   Pulmonary/Chest: Effort normal and breath sounds normal.  Abdominal: Soft. Bowel sounds are normal.  Genitourinary:  Mild vaginal bleeding  Neurological: She is alert and oriented to person, place, and time. She has normal reflexes.  Skin: Skin is warm and dry.  Psychiatric: She has a normal mood and affect. Her behavior is normal. Judgment and thought content normal.    ED Course  Procedures (including critical care time) Labs Review Labs Reviewed  PROTIME-INR - Abnormal; Notable for the following:    Prothrombin Time 20.0 (*)    INR 1.76 (*)    All other components within normal limits  HEPATIC FUNCTION PANEL - Abnormal; Notable for the following:     Albumin 2.9 (*)    Alkaline Phosphatase 38 (*)    Total Bilirubin 0.2 (*)    All other components within normal limits  POC OCCULT BLOOD, ED - Abnormal; Notable for the following:    Fecal Occult Bld POSITIVE (*)    All other components within normal limits  APTT  PREPARE RBC (CROSSMATCH)  TYPE AND SCREEN    Imaging Review No results found.    Date: 03/03/2014  Rate: 102  Rhythm: sinus tachycardia  QRS Axis: normal  Intervals: normal  ST/T Wave abnormalities: nonspecific ST/T changes  Conduction Disutrbances:none  Narrative Interpretation:   Old EKG Reviewed: unchanged   MDM   Final diagnoses:  Rectal bleeding  Vaginal bleeding  Iron deficiency anemia due to chronic blood loss    Patient is a 58 year old female on xarelto for pulmonary embolism with known colon cancer who comes in today with hemoglobin low at 5.8. She does have some shortness of breath but is unclear whether this is secondary to her previous pulmonary embolism or the bleeding. She denies chest pain, lightheadedness, or weakness. She has had ongoing vaginal and rectal bleeding over the past several months and does not think this is any worse than usual. Her stool was grossly bloody. She is being transfused 2 units of packed red blood cells. However, given that she has a pulmonary embolism and is hemodynamically stable, I will not actively reverse this route at this time.  Discussed with Dr. Posey Pronto and he will admit.  Shaune Pollack, MD 03/03/14 [phone]

## 2014-03-03 NOTE — ED Notes (Signed)
Attempt for 2nd IV not successful

## 2014-03-03 NOTE — ED Notes (Signed)
Pt scheduled for surgery went for PRE-OP blood work.  Sent here for HGB of 5.4. Pt reports vaginal bleeding for 1.5 months and rectal bleeding for 2 months EDP Ray to evaluate pt upon arrival to room

## 2014-03-03 NOTE — ED Notes (Signed)
Per lab blood band [id number] left on pt.  Blood band [id number] removed.

## 2014-03-03 NOTE — Pre-Procedure Instructions (Addendum)
03-03-14 EKG done today. Dr. Kalman Shan given history update with Pulmonary blood clot -dx. CT Chest done 01-07-14, states no further CXR needed today. Pt. States Shortness of breath  with exertion only.

## 2014-03-03 NOTE — ED Notes (Signed)
Admitting physician with patient at this time.

## 2014-03-03 NOTE — Telephone Encounter (Signed)
Pt is scheduled for colectomy on 03/09/14 by Dr. Marcello Moores.  [facility] pre-op called to report a Hgb of 5.8.  Dr. Marcello Moores paged and requested we contact the patient and send her back to [facility] ED to have blood drawn again.  If Hgb still low pt may need a transfusion.  Pt is aware and is on her way to the ED.

## 2014-03-04 ENCOUNTER — Inpatient Hospital Stay (HOSPITAL_COMMUNITY): Payer: BC Managed Care – PPO

## 2014-03-04 ENCOUNTER — Encounter (HOSPITAL_COMMUNITY): Payer: Self-pay | Admitting: Radiology

## 2014-03-04 DIAGNOSIS — C189 Malignant neoplasm of colon, unspecified: Secondary | ICD-10-CM

## 2014-03-04 DIAGNOSIS — C187 Malignant neoplasm of sigmoid colon: Secondary | ICD-10-CM | POA: Diagnosis present

## 2014-03-04 DIAGNOSIS — I2699 Other pulmonary embolism without acute cor pulmonale: Secondary | ICD-10-CM

## 2014-03-04 DIAGNOSIS — D219 Benign neoplasm of connective and other soft tissue, unspecified: Secondary | ICD-10-CM | POA: Diagnosis present

## 2014-03-04 DIAGNOSIS — N920 Excessive and frequent menstruation with regular cycle: Secondary | ICD-10-CM

## 2014-03-04 DIAGNOSIS — N921 Excessive and frequent menstruation with irregular cycle: Secondary | ICD-10-CM | POA: Diagnosis present

## 2014-03-04 DIAGNOSIS — D62 Acute posthemorrhagic anemia: Secondary | ICD-10-CM | POA: Diagnosis present

## 2014-03-04 LAB — URINALYSIS, ROUTINE W REFLEX MICROSCOPIC
Bilirubin Urine: NEGATIVE
GLUCOSE, UA: NEGATIVE mg/dL
Ketones, ur: NEGATIVE mg/dL
NITRITE: NEGATIVE
Protein, ur: NEGATIVE mg/dL
SPECIFIC GRAVITY, URINE: 1.029 (ref 1.005–1.030)
Urobilinogen, UA: 0.2 mg/dL (ref 0.0–1.0)
pH: 7 (ref 5.0–8.0)

## 2014-03-04 LAB — RETICULOCYTES
RBC.: 2.55 MIL/uL — ABNORMAL LOW (ref 3.87–5.11)
RETIC CT PCT: 9.4 % — AB (ref 0.4–3.1)
Retic Count, Absolute: 239.7 10*3/uL — ABNORMAL HIGH (ref 19.0–186.0)

## 2014-03-04 LAB — CBC WITH DIFFERENTIAL/PLATELET
BASOS ABS: 0 10*3/uL (ref 0.0–0.1)
BASOS PCT: 0 % (ref 0–1)
EOS PCT: 1 % (ref 0–5)
Eosinophils Absolute: 0.1 10*3/uL (ref 0.0–0.7)
HCT: 24.3 % — ABNORMAL LOW (ref 36.0–46.0)
Hemoglobin: 7.3 g/dL — ABNORMAL LOW (ref 12.0–15.0)
Lymphocytes Relative: 31 % (ref 12–46)
Lymphs Abs: 3.4 10*3/uL (ref 0.7–4.0)
MCH: 28.6 pg (ref 26.0–34.0)
MCHC: 30 g/dL (ref 30.0–36.0)
MCV: 95.3 fL (ref 78.0–100.0)
Monocytes Absolute: 0.7 10*3/uL (ref 0.1–1.0)
Monocytes Relative: 6 % (ref 3–12)
Neutro Abs: 6.9 10*3/uL (ref 1.7–7.7)
Neutrophils Relative %: 62 % (ref 43–77)
PLATELETS: 330 10*3/uL (ref 150–400)
RBC: 2.55 MIL/uL — ABNORMAL LOW (ref 3.87–5.11)
RDW: 15.4 % (ref 11.5–15.5)
WBC: 11 10*3/uL — ABNORMAL HIGH (ref 4.0–10.5)

## 2014-03-04 LAB — BASIC METABOLIC PANEL
BUN: 7 mg/dL (ref 6–23)
CALCIUM: 8.9 mg/dL (ref 8.4–10.5)
CO2: 24 mEq/L (ref 19–32)
CREATININE: 0.87 mg/dL (ref 0.50–1.10)
Chloride: 100 mEq/L (ref 96–112)
GFR, EST AFRICAN AMERICAN: 84 mL/min — AB (ref >90)
GFR, EST NON AFRICAN AMERICAN: 73 mL/min — AB (ref >90)
GLUCOSE: 110 mg/dL — AB (ref 70–99)
POTASSIUM: 3.6 meq/L — AB (ref 3.7–5.3)
Sodium: 138 mEq/L (ref 137–147)

## 2014-03-04 LAB — IRON AND TIBC
IRON: 17 ug/dL — AB (ref 42–135)
Saturation Ratios: 5 % — ABNORMAL LOW (ref 20–55)
TIBC: 366 ug/dL (ref 250–470)
UIBC: 349 ug/dL (ref 125–400)

## 2014-03-04 LAB — CBC
HCT: 29.3 % — ABNORMAL LOW (ref 36.0–46.0)
HEMATOCRIT: 28 % — AB (ref 36.0–46.0)
HEMOGLOBIN: 8.8 g/dL — AB (ref 12.0–15.0)
Hemoglobin: 9.1 g/dL — ABNORMAL LOW (ref 12.0–15.0)
MCH: 29.1 pg (ref 26.0–34.0)
MCH: 28.9 pg (ref 26.0–34.0)
MCHC: 31.1 g/dL (ref 30.0–36.0)
MCHC: 31.4 g/dL (ref 30.0–36.0)
MCV: 93.6 fL (ref 78.0–100.0)
MCV: 91.8 fL (ref 78.0–100.0)
Platelets: 320 10*3/uL (ref 150–400)
Platelets: 324 10*3/uL (ref 150–400)
RBC: 3.13 MIL/uL — ABNORMAL LOW (ref 3.87–5.11)
RBC: 3.05 MIL/uL — ABNORMAL LOW (ref 3.87–5.11)
RDW: 15.4 % (ref 11.5–15.5)
RDW: 15.6 % — AB (ref 11.5–15.5)
WBC: 12.9 10*3/uL — ABNORMAL HIGH (ref 4.0–10.5)
WBC: 10.8 10*3/uL — ABNORMAL HIGH (ref 4.0–10.5)

## 2014-03-04 LAB — URINE MICROSCOPIC-ADD ON

## 2014-03-04 LAB — LACTATE DEHYDROGENASE: LDH: 196 U/L (ref 94–250)

## 2014-03-04 LAB — PREPARE RBC (CROSSMATCH)

## 2014-03-04 LAB — HAPTOGLOBIN: Haptoglobin: 203 mg/dL — ABNORMAL HIGH (ref 45–215)

## 2014-03-04 LAB — FOLATE: Folate: 17.9 ng/mL

## 2014-03-04 LAB — VITAMIN B12: Vitamin B-12: 279 pg/mL (ref 211–911)

## 2014-03-04 LAB — FERRITIN: Ferritin: 9 ng/mL — ABNORMAL LOW (ref 10–291)

## 2014-03-04 MED ORDER — FUROSEMIDE 10 MG/ML IJ SOLN: 40.0000 mg | Freq: Once | INTRAMUSCULAR | Status: DC

## 2014-03-04 MED ORDER — PANTOPRAZOLE SODIUM 40 MG PO TBEC
40.0000 mg | DELAYED_RELEASE_TABLET | Freq: Every day | ORAL | Status: DC
  Administered 2014-03-05 – 2014-03-09 (×5): 40 mg via ORAL
  Filled 2014-03-04 (×6): qty 1

## 2014-03-04 MED ORDER — ACETAMINOPHEN 325 MG PO TABS
650.0000 mg | ORAL_TABLET | Freq: Once | ORAL | Status: DC
  Filled 2014-03-04: qty 2

## 2014-03-04 MED ORDER — DIPHENHYDRAMINE HCL 25 MG PO CAPS
25.0000 mg | ORAL_CAPSULE | Freq: Once | ORAL | Status: DC
  Filled 2014-03-04: qty 1

## 2014-03-04 MED ORDER — IOHEXOL 300 MG/ML  SOLN
50.0000 mL | Freq: Once | INTRAMUSCULAR | Status: AC | PRN
  Administered 2014-03-04: 50 mL via ORAL

## 2014-03-04 MED ORDER — IOHEXOL 300 MG/ML  SOLN
100.0000 mL | Freq: Once | INTRAMUSCULAR | Status: AC | PRN
  Administered 2014-03-04: 100 mL via INTRAVENOUS

## 2014-03-04 NOTE — Progress Notes (Signed)
During second transfusion of blood, pt experienced heavier bleeding accompanied by many, large clots while lying in bed. Pt complained of no pain, VSS. Patel notified. New orders were given. Later in the morning, the bleeding was lighter and no clots were noted. Will continue to monitor pt closely. Blanchard Kelch, RN

## 2014-03-04 NOTE — Progress Notes (Signed)
VASCULAR LAB PRELIMINARY  PRELIMINARY  PRELIMINARY  PRELIMINARY  Bilateral lower extremity venous Dopplers completed.    Preliminary report:  There is no DVT or SVT noted in the bilateral lower extremities.   KANADY, CANDACE, RVT 03/04/2014, 2:24 PM

## 2014-03-04 NOTE — Progress Notes (Signed)
[facility] PROGRESS NOTE  Veronica Reyes QAS:[id number] DOB: 05-Feb-1956 DOA: 03/03/2014 PCP: [name]., FNP  Assessment/Plan: #1 symptomatic anemia/acute blood loss anemia Likely largely secondary to metromenorrhagia with Hemoccults positive stools without overt GI bleed in the setting of anticoagulation of xarelto. Patient stated has had irregular cycles with menses every 3-4 months. Prior to this the last menses was October 2014. Patient with vaginal bleeding with clots this morning with last prior episode 6 days prior to admission. Admission hemoglobin was 5.8. Patient is status post 3 units packed red blood cells. CBC pending. Continue to hold anticoagulation. Transfusion threshold hemoglobin less than 8. Follow.  #2 menorrhagia with irregular cycle Patient states has been having her menses every 3-4 months. Last was in October 2014 prior to this past month. Patient stated that had menses approximately 2 weeks prior to admission with heavy clots which subsided and had initially resolved 6 days prior to admission. Patient stated that it having vaginal bleeding while being transfused second unit of packed red blood cells early this morning. Patient had another vaginal bleeding episode with clots. Unknown etiology. Patient with recent diagnosis of colon cancer. Will get a CT of the abdomen and pelvis. Pelvic ultrasound pending. Continue to hold anticoagulation, xarelto. Follow H&H.  #3 hypertension Stable. Continue metoprolol.  #4 recent history of PE diagnosed 4/24 2015 Patient was on xarelto prior to admission which is on hold secondary to problems #1 and 2. Will check lower extremity Dopplers to rule out DVT. Once menorrhagia has subsided we'll place on IV heparin. Follow.  #5 colon cancer Have informed general surgery of patient's admission. Patient was supposed to have surgery 03/09/2014. Follow.  #6 history of breast cancer status post radiation and surgery.  #7  prophylaxis PPI for GI prophylaxis. SCDs for DVT prophylaxis.    Code Status: Full Family Communication: Updated patient no family present. Disposition Plan: Remain inpatient   Consultants:  None  Procedures:  Chest x-ray 03/03/2014  Status post 3 units packed red blood cells 6/19-/620/2015  Antibiotics:  None  HPI/Subjective: Patient with vaginal bleeding this morning with clots x2. Patient states she's feeling much better. Patient denies any chest pain. No shortness of breath.  Objective: Filed Vitals:   03/04/14 0710  BP: 128/71  Pulse: 78  Temp: 98.8 F (37.1 C)  Resp: 17    Intake/Output Summary (Last 24 hours) at 03/04/14 0953 Last data filed at 03/04/14 0900  Gross per 24 hour  Intake 1892.51 ml  Output      0 ml  Net 1892.51 ml   Filed Weights   03/03/14 2156 03/04/14 0517  Weight: 134.809 kg (297 lb 3.2 oz) 134.945 kg (297 lb 8 oz)    Exam:   General:  NAD  Cardiovascular: RRR  Respiratory: Clear to auscultation bilaterally. No wheezes, no crackles, no rhonchi.  Abdomen: Obese, soft, nontender, nondistended, positive bowel sounds.  Musculoskeletal: No clubbing cyanosis or edema  Data Reviewed: Basic Metabolic Panel:  Recent Labs Lab 03/03/14 1510  NA 135*  K 3.3*  CL 97  CO2 25  GLUCOSE 105*  BUN 7  CREATININE 0.76  CALCIUM 9.2   Liver Function Tests:  Recent Labs Lab 03/03/14 1831  AST 37  ALT 30  ALKPHOS 38*  BILITOT 0.2*  PROT 6.8  ALBUMIN 2.9*   No results found for this basename: LIPASE, AMYLASE,  in the last 168 hours No results found for this basename: AMMONIA,  in the last 168 hours CBC:  Recent Labs  Lab 03/03/14 1510 March 12, 2014 0313 March 12, 2014 0915  WBC 10.9* 11.0* 12.9*  NEUTROABS  --  6.9  --   HGB 5.8* 7.3* 9.1*  HCT 19.9* 24.3* 29.3*  MCV 95.7 95.3 93.6  PLT 391 330 320   Cardiac Enzymes: No results found for this basename: CKTOTAL, CKMB, CKMBINDEX, TROPONINI,  in the last 168 hours BNP (last  3 results) No results found for this basename: PROBNP,  in the last 8760 hours CBG: No results found for this basename: GLUCAP,  in the last 168 hours  No results found for this or any previous visit (from the past 240 hour(s)).   Studies: US Transvaginal Non-ob  Mar 12, 2014   CLINICAL DATA:  Vaginal bleeding  EXAM: TRANSABDOMINAL AND TRANSVAGINAL ULTRASOUND OF PELVIS  TECHNIQUE: Both transabdominal and transvaginal ultrasound examinations of the pelvis were performed. Transabdominal technique was performed for global imaging of the pelvis including uterus, ovaries, adnexal regions, and pelvic cul-de-sac. It was necessary to proceed with endovaginal exam following the transabdominal exam to visualize the endometrium.  COMPARISON:  None  FINDINGS: Uterus  Measurements: 9.1 x 7.2 x 7.1 cm. Uterus is heterogeneous, with 3 mildly hypoechoic mass is identified consistent fibroids the largest measuring 2.6 cm. In the fundus there is an intramural fibroid measuring 2.2 cm. The 2.6 cm fibroid is seen in the subendometrial position in the anterior body of the uterus, with an adjacent 1.2 cm fibroid. The uterus is retroverted.  Endometrium  Thickness: 14 mm.  Heterogeneous echotexture  Right ovary  Measurements: 30 x 20 x 18 mm. Normal appearance/no adnexal mass.  Left ovary  Not visualized  Other findings  No free fluid.  IMPRESSION: 1. 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 2. Heterogeneous prominent endometrium which could be due to hyperplasia, polyp, or carcinoma.   Electronically Signed   By: Skipper Cliche M.D.   On: March 12, 2014 09:36   US Pelvis Complete  03-12-14   CLINICAL DATA:  Vaginal bleeding  EXAM: TRANSABDOMINAL AND TRANSVAGINAL ULTRASOUND OF PELVIS  TECHNIQUE: Both transabdominal and transvaginal ultrasound examinations of the pelvis were performed. Transabdominal technique was performed for global imaging of the pelvis  including uterus, ovaries, adnexal regions, and pelvic cul-de-sac. It was necessary to proceed with endovaginal exam following the transabdominal exam to visualize the endometrium.  COMPARISON:  None  FINDINGS: Uterus  Measurements: 9.1 x 7.2 x 7.1 cm. Uterus is heterogeneous, with 3 mildly hypoechoic mass is identified consistent fibroids the largest measuring 2.6 cm. In the fundus there is an intramural fibroid measuring 2.2 cm. The 2.6 cm fibroid is seen in the subendometrial position in the anterior body of the uterus, with an adjacent 1.2 cm fibroid. The uterus is retroverted.  Endometrium  Thickness: 14 mm.  Heterogeneous echotexture  Right ovary  Measurements: 30 x 20 x 18 mm. Normal appearance/no adnexal mass.  Left ovary  Not visualized  Other findings  No free fluid.  IMPRESSION: 1. 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 2. Heterogeneous prominent endometrium which could be due to hyperplasia, polyp, or carcinoma.   Electronically Signed   By: Skipper Cliche M.D.   On: 12-Mar-2014 09:36   Dg Chest Port 1 View  03/03/2014   CLINICAL DATA:  Shortness of breath with exertion  EXAM: PORTABLE CHEST - 1 VIEW  COMPARISON:  None.  FINDINGS: Moderate cardiac enlargement similar  to prior study. Mild vascular congestion with no evidence of edema or consolidation.  IMPRESSION: Cardiac enlargement with minimal vascular congestion and no pulmonary edema   Electronically Signed   By: Skipper Cliche M.D.   On: 03/03/2014 19:07    Scheduled Meds: . acetaminophen  650 mg Oral Once  . diphenhydrAMINE  25 mg Oral Once  . ferrous sulfate  325 mg Oral q morning - 10a  . furosemide  40 mg Intravenous Once  . metoprolol  50 mg Oral BID  . pantoprazole (PROTONIX) IV  40 mg Intravenous Q12H  . sodium chloride  3 mL Intravenous Q12H   Continuous Infusions:   Principal Problem:   Symptomatic anemia Active Problems:   Menorrhagia with  irregular cycle   Acute blood loss anemia   Pulmonary embolism   Breast cancer   Essential hypertension, benign   History of BRBPR (bright red blood per rectum)   Colon cancer    Time spent: 74 minutes    THOMPSON,DANIEL M.D. [facility] Pager [phone]. If 7PM-7AM, please contact night-coverage at [email or web address], password [facility] 03/04/2014, 9:53 AM  LOS: 1 day

## 2014-03-05 LAB — BASIC METABOLIC PANEL WITH GFR
BUN: 7 mg/dL (ref 6–23)
CO2: 26 meq/L (ref 19–32)
Calcium: 9 mg/dL (ref 8.4–10.5)
Chloride: 103 meq/L (ref 96–112)
Creatinine, Ser: 0.95 mg/dL (ref 0.50–1.10)
GFR calc Af Amer: 76 mL/min — ABNORMAL LOW
GFR calc non Af Amer: 65 mL/min — ABNORMAL LOW
Glucose, Bld: 95 mg/dL (ref 70–99)
Potassium: 3.6 meq/L — ABNORMAL LOW (ref 3.7–5.3)
Sodium: 140 meq/L (ref 137–147)

## 2014-03-05 LAB — CBC
HCT: 26.3 % — ABNORMAL LOW (ref 36.0–46.0)
HEMATOCRIT: 27.6 % — AB (ref 36.0–46.0)
Hemoglobin: 8.2 g/dL — ABNORMAL LOW (ref 12.0–15.0)
Hemoglobin: 8.6 g/dL — ABNORMAL LOW (ref 12.0–15.0)
MCH: 28.9 pg (ref 26.0–34.0)
MCH: 29.1 pg (ref 26.0–34.0)
MCHC: 31.2 g/dL (ref 30.0–36.0)
MCHC: 31.2 g/dL (ref 30.0–36.0)
MCV: 92.6 fL (ref 78.0–100.0)
MCV: 93.2 fL (ref 78.0–100.0)
PLATELETS: 318 10*3/uL (ref 150–400)
Platelets: 331 K/uL (ref 150–400)
RBC: 2.84 MIL/uL — ABNORMAL LOW (ref 3.87–5.11)
RBC: 2.96 MIL/uL — ABNORMAL LOW (ref 3.87–5.11)
RDW: 15.5 % (ref 11.5–15.5)
RDW: 15.3 % (ref 11.5–15.5)
WBC: 9.7 K/uL (ref 4.0–10.5)
WBC: 9.1 10*3/uL (ref 4.0–10.5)

## 2014-03-05 MED ORDER — FERROUS SULFATE 325 (65 FE) MG PO TABS
325.0000 mg | ORAL_TABLET | Freq: Three times a day (TID) | ORAL | Status: DC
  Administered 2014-03-05 – 2014-03-08 (×11): 325 mg via ORAL
  Filled 2014-03-05 (×15): qty 1

## 2014-03-05 MED ORDER — POTASSIUM CHLORIDE CRYS ER 20 MEQ PO TBCR
40.0000 meq | EXTENDED_RELEASE_TABLET | Freq: Once | ORAL | Status: AC
Start: 2014-03-05 — End: 2014-03-05
  Administered 2014-03-05: 40 meq via ORAL
  Filled 2014-03-05: qty 2

## 2014-03-05 NOTE — Progress Notes (Signed)
[facility] PROGRESS NOTE  Veronica Reyes GEX:[id number] DOB: 1955/11/19 DOA: 03/03/2014 PCP: [name]., FNP  Assessment/Plan: #1 symptomatic anemia/acute blood loss anemia Likely largely secondary to metromenorrhagia with Hemoccults positive stools without overt GI bleed in the setting of anticoagulation of xarelto. Patient stated has had irregular cycles with menses every 3-4 months. Prior to this the last menses was October 2014. Patient with vaginal bleeding with clots yesterday morning with last prior episode 6 days prior to admission. Minimal vaginal bleeding. Admission hemoglobin was 5.8. Patient is status post 3 units packed red blood cells. Hemoglobin currently at 8.2. Continue to hold anticoagulation. Transfusion threshold hemoglobin less than 8. Follow.  #2 menorrhagia with irregular cycle Patient states has been having her menses every 3-4 months. Last was in October 2014 prior to this past month. Patient stated that had menses approximately 2 weeks prior to admission with heavy clots which subsided and had initially resolved 6 days prior to admission. Patient restarted  having vaginal bleeding while being transfused second unit of packed red blood cells early yesterday  morning. Patient with minimal vaginal bleeding since last night. Pelvic ultrasound with fibroids and a thickened endometrium. Spoke with Dr. Hulan Fray, GYN who will assess the patient and likely perform an endometrial biopsy tomorrow for further evaluation. Will continue to hold xarelto.  #3 hypertension Stable. Continue metoprolol.  #4 recent history of PE diagnosed 4/24 2015 Patient was on xarelto prior to admission which is on hold secondary to problems #1 and 2. Lower extremity Dopplers were negative for DVT. If no further menorrhagia tomorrow will place on IV heparin. Follow.  #5 colon cancer Have informed general surgery of patient's admission. Patient was supposed to have surgery 03/09/2014.  [facility]. Follow.  #6 history of breast cancer status post radiation and surgery.  #7 bilateral renal cysts CT abdomen and pelvis with bilateral renal cysts with largest on the right kidney with questionable area of mural nodularity. Followup nonemergent MR imaging with and without contrast recommended for further evaluation. Will get MRI for further evaluation.   #8 prophylaxis PPI for GI prophylaxis. SCDs for DVT prophylaxis.    Code Status: Full Family Communication: Updated patient no family present. Disposition Plan: Remain inpatient   Consultants:  General surgery: Dr. Johney Maine 03/05/2014  Procedures:  Chest x-ray 03/03/2014  Status post 3 units packed red blood cells 6/19-6/20/2015  CT abdomen and pelvis 03/04/2014  Pelvic ultrasound 03/04/2014  Antibiotics:  None  HPI/Subjective: Patient states she's feeling much better. Patient denies any chest pain. No shortness of breath. Patient states minimal vaginal bleeding since last night.  Objective: Filed Vitals:   03/05/14 0615  BP: 121/70  Pulse: 87  Temp: 98.6 F (37 C)  Resp: 20    Intake/Output Summary (Last 24 hours) at 03/05/14 1016 Last data filed at 03/05/14 0900  Gross per 24 hour  Intake    720 ml  Output      0 ml  Net    720 ml   Filed Weights   03/03/14 2156 03/04/14 0517 03/05/14 0615  Weight: 134.809 kg (297 lb 3.2 oz) 134.945 kg (297 lb 8 oz) 133.358 kg (294 lb)    Exam:   General:  NAD  Cardiovascular: RRR  Respiratory: Clear to auscultation bilaterally. No wheezes, no crackles, no rhonchi.  Abdomen: Obese, soft, nontender, nondistended, positive bowel sounds.  Musculoskeletal: No clubbing cyanosis or edema  Data Reviewed: Basic Metabolic Panel:  Recent Labs Lab 03/03/14 1510 03/04/14 0915 03/05/14 0353  NA 135*  138 140  K 3.3* 3.6* 3.6*  CL 97 100 103  CO2 25 24 26   GLUCOSE 105* 110* 95  BUN 7 7 7   CREATININE 0.76 0.87 0.95  CALCIUM 9.2 8.9 9.0   Liver Function  Tests:  Recent Labs Lab 03/03/14 1831  AST 37  ALT 30  ALKPHOS 38*  BILITOT 0.2*  PROT 6.8  ALBUMIN 2.9*   No results found for this basename: LIPASE, AMYLASE,  in the last 168 hours No results found for this basename: AMMONIA,  in the last 168 hours CBC:  Recent Labs Lab 03/03/14 1510 2014-03-14 0313 03/14/2014 0915 03/14/2014 1705 03/05/14 0353  WBC 10.9* 11.0* 12.9* 10.8* 9.7  NEUTROABS  --  6.9  --   --   --   HGB 5.8* 7.3* 9.1* 8.8* 8.2*  HCT 19.9* 24.3* 29.3* 28.0* 26.3*  MCV 95.7 95.3 93.6 91.8 92.6  PLT 391 330 320 324 331   Cardiac Enzymes: No results found for this basename: CKTOTAL, CKMB, CKMBINDEX, TROPONINI,  in the last 168 hours BNP (last 3 results) No results found for this basename: PROBNP,  in the last 8760 hours CBG: No results found for this basename: GLUCAP,  in the last 168 hours  No results found for this or any previous visit (from the past 240 hour(s)).   Studies: US Transvaginal Non-ob  03-14-2014   CLINICAL DATA:  Vaginal bleeding  EXAM: TRANSABDOMINAL AND TRANSVAGINAL ULTRASOUND OF PELVIS  TECHNIQUE: Both transabdominal and transvaginal ultrasound examinations of the pelvis were performed. Transabdominal technique was performed for global imaging of the pelvis including uterus, ovaries, adnexal regions, and pelvic cul-de-sac. It was necessary to proceed with endovaginal exam following the transabdominal exam to visualize the endometrium.  COMPARISON:  None  FINDINGS: Uterus  Measurements: 9.1 x 7.2 x 7.1 cm. Uterus is heterogeneous, with 3 mildly hypoechoic mass is identified consistent fibroids the largest measuring 2.6 cm. In the fundus there is an intramural fibroid measuring 2.2 cm. The 2.6 cm fibroid is seen in the subendometrial position in the anterior body of the uterus, with an adjacent 1.2 cm fibroid. The uterus is retroverted.  Endometrium  Thickness: 14 mm.  Heterogeneous echotexture  Right ovary  Measurements: 30 x 20 x 18 mm. Normal  appearance/no adnexal mass.  Left ovary  Not visualized  Other findings  No free fluid.  IMPRESSION: 1. 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 2. Heterogeneous prominent endometrium which could be due to hyperplasia, polyp, or carcinoma.   Electronically Signed   By: Skipper Cliche M.D.   On: 2014/03/14 09:36   US Pelvis Complete  March 14, 2014   CLINICAL DATA:  Vaginal bleeding  EXAM: TRANSABDOMINAL AND TRANSVAGINAL ULTRASOUND OF PELVIS  TECHNIQUE: Both transabdominal and transvaginal ultrasound examinations of the pelvis were performed. Transabdominal technique was performed for global imaging of the pelvis including uterus, ovaries, adnexal regions, and pelvic cul-de-sac. It was necessary to proceed with endovaginal exam following the transabdominal exam to visualize the endometrium.  COMPARISON:  None  FINDINGS: Uterus  Measurements: 9.1 x 7.2 x 7.1 cm. Uterus is heterogeneous, with 3 mildly hypoechoic mass is identified consistent fibroids the largest measuring 2.6 cm. In the fundus there is an intramural fibroid measuring 2.2 cm. The 2.6 cm fibroid is seen in the subendometrial position in the anterior body of the uterus, with an adjacent 1.2 cm fibroid. The uterus is retroverted.  Endometrium  Thickness: 14  mm.  Heterogeneous echotexture  Right ovary  Measurements: 30 x 20 x 18 mm. Normal appearance/no adnexal mass.  Left ovary  Not visualized  Other findings  No free fluid.  IMPRESSION: 1. 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 2. Heterogeneous prominent endometrium which could be due to hyperplasia, polyp, or carcinoma.   Electronically Signed   By: Skipper Cliche M.D.   On: 03/04/2014 09:36   Ct Abdomen Pelvis W Contrast  03/04/2014   CLINICAL DATA:  Generalized weakness, tiredness, tachycardia, anemia, bladder and bowels, menorrhagia, history breast cancer  post radiation therapy, colon cancer pre colectomy, hypertension  EXAM: CT ABDOMEN AND PELVIS WITH CONTRAST  TECHNIQUE: Multidetector CT imaging of the abdomen and pelvis was performed using the standard protocol following bolus administration of intravenous contrast. Sagittal and coronal MPR images reconstructed from axial data set.  CONTRAST:  60mL OMNIPAQUE IOHEXOL 300 MG/ML SOLN, 171mL OMNIPAQUE IOHEXOL 300 MG/ML SOLN  COMPARISON:  01/06/2014  FINDINGS: Lung bases clear.  Gallbladder surgically absent.  BILATERAL renal cysts, largest at RIGHT inferior pole questionably demonstrating mural nodularity, overall lesion 4.1 x 4.1 x 3.6 cm.  4 mm low-attenuation focus anteriorly in medial segment LEFT lobe liver image 21 unchanged.  Question new 6 mm low-attenuation nodule medial segment LEFT lobe liver image 21.  Remainder of liver, spleen, pancreas, kidneys, and adrenal glands normal appearance.  Central low attenuation within uterus could be related to phase of menses.  Unremarkable bladder, ureters, and adnexae.  Normal appendix.  Mass at distal descending colon 3.8 x 4.1 cm image 59, approximately 5.8 cm length.  9 mm short axis lymph node in mesentery medial to the descending colon a previously 10 mm.  Stomach and bowel loops otherwise unremarkable.  No mass, adenopathy, free fluid or free air.  Mild scattered atherosclerotic calcification.  No acute osseous findings.  IMPRESSION: 3.8 x 4.1 x 5.8 cm diameter distal descending colon mass compatible with a primary colonic malignancy.  9 mm short axis lymph node in the adjacent colonic mesenteric.  BILATERAL renal cysts, largest of which at the RIGHT kidney shows questionable area of mural nodularity; followup nonemergent characterization of this lesion by MR imaging with and without contrast recommended to exclude cystic renal neoplasm.  Two nonspecific low-attenuation foci within liver.   Electronically Signed   By: Lavonia Dana M.D.   On: 03/04/2014 11:12   Dg  Chest Port 1 View  03/03/2014   CLINICAL DATA:  Shortness of breath with exertion  EXAM: PORTABLE CHEST - 1 VIEW  COMPARISON:  None.  FINDINGS: Moderate cardiac enlargement similar to prior study. Mild vascular congestion with no evidence of edema or consolidation.  IMPRESSION: Cardiac enlargement with minimal vascular congestion and no pulmonary edema   Electronically Signed   By: Skipper Cliche M.D.   On: 03/03/2014 19:07    Scheduled Meds: . ferrous sulfate  325 mg Oral q morning - 10a  . metoprolol  50 mg Oral BID  . pantoprazole  40 mg Oral Q0600  . sodium chloride  3 mL Intravenous Q12H   Continuous Infusions:   Principal Problem:   Symptomatic anemia Active Problems:   Menorrhagia with irregular cycle   Acute blood loss anemia   Pulmonary embolism   Breast cancer   Essential hypertension, benign   History of BRBPR (bright red blood per rectum)   Colon cancer   Fibroids    Time spent:  40 minutes    THOMPSON,DANIEL M.D. [facility] Pager [phone]. If 7PM-7AM, please contact night-coverage at [email or web address], password [facility] 03/05/2014, 10:16 AM  LOS: 2 days

## 2014-03-05 NOTE — Progress Notes (Signed)
[facility]  [address] [place], [place] [address] Phone: [phone] FAX: [phone]    Veronica Reyes [id number] 18-Mar-1956  CARE TEAM:  PCP: [name]., FNP  Outpatient Care Team: Patient Care Team: Claris Gladden. Ouida Sills, [place] as PCP - General (Nurse Practitioner) Leighton Ruff, MD as Consulting Physician (General Surgery) Beryle Beams, MD as Consulting Physician (Gastroenterology)  Inpatient Treatment Team: Treatment Team: Attending Charnice Zwilling: Eugenie Filler, MD; Technician: Marlene Bast, NT; Registered Nurse: Blanchard Kelch, RN; Rounding Team: Threasa Beards, MD; Technician: Jani Files, NT; Consulting Physician: Nolon Nations, MD; Registered Nurse: Atilano Median, RN; Technician: Coralie Carpen, NT; Registered Nurse: Kathleen Argue, RN   Subjective:  Feeling better Min vaginal bleeding off Xerelto Duplex - no DVT  Objective:  Vital signs:  Filed Vitals:   03/04/14 0710 03/04/14 1300 03/04/14 2223 03/05/14 0615  BP: 128/71 106/67 132/77 121/70  Pulse: 78 78 80 87  Temp: 98.8 F (37.1 C) 97.9 F (36.6 C) 98.4 F (36.9 C) 98.6 F (37 C)  TempSrc: Oral Oral Oral Oral  Resp: '17 18 18 20  ' Height:      Weight:    294 lb (133.358 kg)  SpO2: 100% 100% 100% 100%    Last BM Date: 03/04/14  Intake/Output   Yesterday:  06/20 0701 - 06/21 0700 In: 1622.9 [P.O.:1250; Blood:372.9] Out: -  This shift:  Total I/O In: 120 [P.O.:120] Out: -   Bowel function:  Flatus: y  BM: y  Drain: n/a  Physical Exam:  General: Pt awake/alert/oriented x4 in no acute distress Eyes: PERRL, normal EOM.  Sclera clear.  No icterus Neuro: CN II-XII intact w/o focal sensory/motor deficits. Lymph: No head/neck/groin lymphadenopathy Psych:  No delerium/psychosis/paranoia HENT: Normocephalic, Mucus membranes moist.  No thrush Neck: Supple, No tracheal deviation Chest: No chest wall pain w good excursion CV:   Pulses intact.  Regular rhythm MS: Normal AROM mjr joints.  No obvious deformity Abdomen: Soft.  Nondistended.  Nontender.  No evidence of peritonitis.  No incarcerated hernias. Ext:  SCDs BLE.  No mjr edema.  No cyanosis Skin: No petechiae / purpura   Problem List:   Principal Problem:   Symptomatic anemia Active Problems:   Pulmonary embolism   Breast cancer   Essential hypertension, benign   History of BRBPR (bright red blood per rectum)   Colon cancer   Menorrhagia with irregular cycle   Acute blood loss anemia   Fibroids   Assessment  Veronica Reyes  58 y.o. female       Significant vaginal and rectal bleeding due to fibroid uterus and colon cancer wall fully anticoagulated for pulmonary embolism.  Approved overall.  Plan:  At some point she would benefit from colectomy.  Plan is in 5 days.  Perhaps would be safest to stay off anticoagulation until time of surgery.  No evidence of DVT.  Therefore no strong need for Greenfield filter preoperatively at this time.  Deferred to medicine to see if safe to stay off anticoagulation until surgery   Consider gynecological evaluation now to see if vaginal bleeding can be better controlled with endometrial ablation or if anything needs to be done prior to colectomy so that the a patient does not have increased periop complications for colectomy.  Maybe even needs hysterectomy of a persistent problem.  Patient will continue to need full anticoagulation for pulmonary embolism.  Newly diagnosed renal mass.  ? Not seen on recent prior CT.  Consider MRI of abdomen.  The patient is stable.  There is no evidence of peritonitis, acute abdomen, nor shock.  There is no strong evidence of failure of improvement nor decline with current non-operative management.  There is no need for surgery at the present moment.  We will continue to follow.  VTE prophylaxis- SCDs, etc  mobilize as tolerated to help recovery  Adin Hector, M.D.,  F.A.C.S. Gastrointestinal and Minimally Invasive Surgery [facility] [address] [place], [place] [address] [phone] Main / Paging   03/05/2014   Results:   Labs: Results for orders placed during the hospital encounter of 03/03/14 (from the past 48 hour(s))  APTT     Status: None   Collection Time    03/03/14  6:29 PM      Result Value Ref Range   aPTT 30  24 - 37 seconds  PROTIME-INR     Status: Abnormal   Collection Time    03/03/14  6:29 PM      Result Value Ref Range   Prothrombin Time 20.0 (*) 11.6 - 15.2 seconds   INR 1.76 (*) 0.00 - 1.49  HEPATIC FUNCTION PANEL     Status: Abnormal   Collection Time    03/03/14  6:31 PM      Result Value Ref Range   Total Protein 6.8  6.0 - 8.3 g/dL   Albumin 2.9 (*) 3.5 - 5.2 g/dL   AST 37  0 - 37 U/L   ALT 30  0 - 35 U/L   Alkaline Phosphatase 38 (*) 39 - 117 U/L   Total Bilirubin 0.2 (*) 0.3 - 1.2 mg/dL   Bilirubin, Direct <0.2  0.0 - 0.3 mg/dL   Indirect Bilirubin NOT CALCULATED  0.3 - 0.9 mg/dL  POC OCCULT BLOOD, ED     Status: Abnormal   Collection Time    03/03/14  6:36 PM      Result Value Ref Range   Fecal Occult Bld POSITIVE (*) NEGATIVE  PREPARE RBC (CROSSMATCH)     Status: None   Collection Time    03/03/14  7:10 PM      Result Value Ref Range   Order Confirmation ORDER PROCESSED BY BLOOD BANK    CBC WITH DIFFERENTIAL     Status: Abnormal   Collection Time    03/04/14  3:13 AM      Result Value Ref Range   WBC 11.0 (*) 4.0 - 10.5 K/uL   RBC 2.55 (*) 3.87 - 5.11 MIL/uL   Hemoglobin 7.3 (*) 12.0 - 15.0 g/dL   Comment: POST TRANSFUSION SPECIMEN     REPEATED TO VERIFY     DELTA CHECK NOTED   HCT 24.3 (*) 36.0 - 46.0 %   MCV 95.3  78.0 - 100.0 fL   MCH 28.6  26.0 - 34.0 pg   MCHC 30.0  30.0 - 36.0 g/dL   RDW 15.4  11.5 - 15.5 %   Platelets 330  150 - 400 K/uL   Neutrophils Relative % 62  43 - 77 %   Neutro Abs 6.9  1.7 - 7.7 K/uL   Lymphocytes Relative 31  12 - 46  %   Lymphs Abs 3.4  0.7 - 4.0 K/uL   Monocytes Relative 6  3 - 12 %   Monocytes Absolute 0.7  0.1 - 1.0 K/uL   Eosinophils Relative 1  0 - 5 %   Eosinophils Absolute 0.1  0.0 -  0.7 K/uL   Basophils Relative 0  0 - 1 %   Basophils Absolute 0.0  0.0 - 0.1 K/uL  RETICULOCYTES     Status: Abnormal   Collection Time    03/04/14  3:13 AM      Result Value Ref Range   Retic Ct Pct 9.4 (*) 0.4 - 3.1 %   RBC. 2.55 (*) 3.87 - 5.11 MIL/uL   Retic Count, Manual 239.7 (*) 19.0 - 186.0 K/uL  FERRITIN     Status: Abnormal   Collection Time    03/04/14  3:13 AM      Result Value Ref Range   Ferritin 9 (*) 10 - 291 ng/mL   Comment: Performed at [facility]     Status: Abnormal   Collection Time    03/04/14  3:13 AM      Result Value Ref Range   Iron 17 (*) 42 - 135 ug/dL   TIBC 366  250 - 470 ug/dL   Saturation Ratios 5 (*) 20 - 55 %   UIBC 349  125 - 400 ug/dL   Comment: Performed at [facility]     Status: None   Collection Time    03/04/14  3:13 AM      Result Value Ref Range   Vitamin B-12 279  211 - 911 pg/mL   Comment: Performed at [facility]     Status: None   Collection Time    03/04/14  3:13 AM      Result Value Ref Range   Folate 17.9     Comment: (NOTE)     Reference Ranges            Deficient:       0.4 - 3.3 ng/mL            Indeterminate:   3.4 - 5.4 ng/mL            Normal:              > 5.4 ng/mL     Performed at [facility]  LACTATE DEHYDROGENASE     Status: None   Collection Time    03/04/14  3:13 AM      Result Value Ref Range   LDH 196  94 - 250 U/L  HAPTOGLOBIN     Status: Abnormal   Collection Time    03/04/14  3:13 AM      Result Value Ref Range   Haptoglobin 203 (*) 45 - 215 mg/dL   Comment: Performed at [facility] (CROSSMATCH)     Status: None   Collection Time    03/04/14  4:00 AM      Result Value Ref Range   Order Confirmation ORDER PROCESSED BY  BLOOD BANK    BASIC METABOLIC PANEL     Status: Abnormal   Collection Time    03/04/14  9:15 AM      Result Value Ref Range   Sodium 138  137 - 147 mEq/L   Potassium 3.6 (*) 3.7 - 5.3 mEq/L   Chloride 100  96 - 112 mEq/L   CO2 24  19 - 32 mEq/L   Glucose, Bld 110 (*) 70 - 99 mg/dL   BUN 7  6 - 23 mg/dL   Creatinine, Ser 0.87  0.50 - 1.10 mg/dL   Calcium 8.9  8.4 - 10.5 mg/dL  GFR calc non Af Amer 73 (*) >90 mL/min   GFR calc Af Amer 84 (*) >90 mL/min   Comment: (NOTE)     The eGFR has been calculated using the CKD EPI equation.     This calculation has not been validated in all clinical situations.     eGFR's persistently <90 mL/min signify possible Chronic Kidney     Disease.  CBC     Status: Abnormal   Collection Time    03/04/14  9:15 AM      Result Value Ref Range   WBC 12.9 (*) 4.0 - 10.5 K/uL   Comment: WHITE COUNT CONFIRMED ON SMEAR   RBC 3.13 (*) 3.87 - 5.11 MIL/uL   Hemoglobin 9.1 (*) 12.0 - 15.0 g/dL   Comment: RESULT REPEATED AND VERIFIED     DELTA CHECK NOTED     POST TRANSFUSION SPECIMEN   HCT 29.3 (*) 36.0 - 46.0 %   MCV 93.6  78.0 - 100.0 fL   MCH 29.1  26.0 - 34.0 pg   MCHC 31.1  30.0 - 36.0 g/dL   RDW 15.4  11.5 - 15.5 %   Platelets 320  150 - 400 K/uL  URINALYSIS, ROUTINE W REFLEX MICROSCOPIC     Status: Abnormal   Collection Time    03/04/14 12:35 PM      Result Value Ref Range   Color, Urine YELLOW  YELLOW   APPearance CLEAR  CLEAR   Specific Gravity, Urine 1.029  1.005 - 1.030   pH 7.0  5.0 - 8.0   Glucose, UA NEGATIVE  NEGATIVE mg/dL   Hgb urine dipstick LARGE (*) NEGATIVE   Bilirubin Urine NEGATIVE  NEGATIVE   Ketones, ur NEGATIVE  NEGATIVE mg/dL   Protein, ur NEGATIVE  NEGATIVE mg/dL   Urobilinogen, UA 0.2  0.0 - 1.0 mg/dL   Nitrite NEGATIVE  NEGATIVE   Leukocytes, UA SMALL (*) NEGATIVE  URINE MICROSCOPIC-ADD ON     Status: Abnormal   Collection Time    03/04/14 12:35 PM      Result Value Ref Range   Squamous Epithelial / LPF FEW (*)  RARE   WBC, UA 3-6  <3 WBC/hpf   RBC / HPF 21-50  <3 RBC/hpf   Bacteria, UA FEW (*) RARE  CBC     Status: Abnormal   Collection Time    03/04/14  5:05 PM      Result Value Ref Range   WBC 10.8 (*) 4.0 - 10.5 K/uL   RBC 3.05 (*) 3.87 - 5.11 MIL/uL   Hemoglobin 8.8 (*) 12.0 - 15.0 g/dL   HCT 28.0 (*) 36.0 - 46.0 %   MCV 91.8  78.0 - 100.0 fL   MCH 28.9  26.0 - 34.0 pg   MCHC 31.4  30.0 - 36.0 g/dL   RDW 15.6 (*) 11.5 - 15.5 %   Platelets 324  150 - 400 K/uL  CBC     Status: Abnormal   Collection Time    03/05/14  3:53 AM      Result Value Ref Range   WBC 9.7  4.0 - 10.5 K/uL   RBC 2.84 (*) 3.87 - 5.11 MIL/uL   Hemoglobin 8.2 (*) 12.0 - 15.0 g/dL   HCT 26.3 (*) 36.0 - 46.0 %   MCV 92.6  78.0 - 100.0 fL   MCH 28.9  26.0 - 34.0 pg   MCHC 31.2  30.0 - 36.0 g/dL   RDW 15.5  11.5 -  15.5 %   Platelets 331  150 - 400 K/uL  BASIC METABOLIC PANEL     Status: Abnormal   Collection Time    03/05/14  3:53 AM      Result Value Ref Range   Sodium 140  137 - 147 mEq/L   Potassium 3.6 (*) 3.7 - 5.3 mEq/L   Chloride 103  96 - 112 mEq/L   CO2 26  19 - 32 mEq/L   Glucose, Bld 95  70 - 99 mg/dL   BUN 7  6 - 23 mg/dL   Creatinine, Ser 0.95  0.50 - 1.10 mg/dL   Calcium 9.0  8.4 - 10.5 mg/dL   GFR calc non Af Amer 65 (*) >90 mL/min   GFR calc Af Amer 76 (*) >90 mL/min   Comment: (NOTE)     The eGFR has been calculated using the CKD EPI equation.     This calculation has not been validated in all clinical situations.     eGFR's persistently <90 mL/min signify possible Chronic Kidney     Disease.    Imaging / Studies: US Transvaginal Non-ob  March 22, 2014   CLINICAL DATA:  Vaginal bleeding  EXAM: TRANSABDOMINAL AND TRANSVAGINAL ULTRASOUND OF PELVIS  TECHNIQUE: Both transabdominal and transvaginal ultrasound examinations of the pelvis were performed. Transabdominal technique was performed for global imaging of the pelvis including uterus, ovaries, adnexal regions, and pelvic cul-de-sac. It was  necessary to proceed with endovaginal exam following the transabdominal exam to visualize the endometrium.  COMPARISON:  None  FINDINGS: Uterus  Measurements: 9.1 x 7.2 x 7.1 cm. Uterus is heterogeneous, with 3 mildly hypoechoic mass is identified consistent fibroids the largest measuring 2.6 cm. In the fundus there is an intramural fibroid measuring 2.2 cm. The 2.6 cm fibroid is seen in the subendometrial position in the anterior body of the uterus, with an adjacent 1.2 cm fibroid. The uterus is retroverted.  Endometrium  Thickness: 14 mm.  Heterogeneous echotexture  Right ovary  Measurements: 30 x 20 x 18 mm. Normal appearance/no adnexal mass.  Left ovary  Not visualized  Other findings  No free fluid.  IMPRESSION: 1. 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 2. Heterogeneous prominent endometrium which could be due to hyperplasia, polyp, or carcinoma.   Electronically Signed   By: Skipper Cliche M.D.   On: 03-22-2014 09:36   US Pelvis Complete  2014-03-22   CLINICAL DATA:  Vaginal bleeding  EXAM: TRANSABDOMINAL AND TRANSVAGINAL ULTRASOUND OF PELVIS  TECHNIQUE: Both transabdominal and transvaginal ultrasound examinations of the pelvis were performed. Transabdominal technique was performed for global imaging of the pelvis including uterus, ovaries, adnexal regions, and pelvic cul-de-sac. It was necessary to proceed with endovaginal exam following the transabdominal exam to visualize the endometrium.  COMPARISON:  None  FINDINGS: Uterus  Measurements: 9.1 x 7.2 x 7.1 cm. Uterus is heterogeneous, with 3 mildly hypoechoic mass is identified consistent fibroids the largest measuring 2.6 cm. In the fundus there is an intramural fibroid measuring 2.2 cm. The 2.6 cm fibroid is seen in the subendometrial position in the anterior body of the uterus, with an adjacent 1.2 cm fibroid. The uterus is retroverted.  Endometrium  Thickness: 14 mm.   Heterogeneous echotexture  Right ovary  Measurements: 30 x 20 x 18 mm. Normal appearance/no adnexal mass.  Left ovary  Not visualized  Other findings  No free fluid.  IMPRESSION: 1. Three fibroids in  the uterus, 2 which are in a sub endometrial position and may contribute to vaginal bleeding. 2. Heterogeneous prominent endometrium which could be due to hyperplasia, polyp, or carcinoma.   Electronically Signed   By: Skipper Cliche M.D.   On: 03/04/2014 09:36   Ct Abdomen Pelvis W Contrast  03/04/2014   CLINICAL DATA:  Generalized weakness, tiredness, tachycardia, anemia, bladder and bowels, menorrhagia, history breast cancer post radiation therapy, colon cancer pre colectomy, hypertension  EXAM: CT ABDOMEN AND PELVIS WITH CONTRAST  TECHNIQUE: Multidetector CT imaging of the abdomen and pelvis was performed using the standard protocol following bolus administration of intravenous contrast. Sagittal and coronal MPR images reconstructed from axial data set.  CONTRAST:  50m OMNIPAQUE IOHEXOL 300 MG/ML SOLN, 1019mOMNIPAQUE IOHEXOL 300 MG/ML SOLN  COMPARISON:  01/06/2014  FINDINGS: Lung bases clear.  Gallbladder surgically absent.  BILATERAL renal cysts, largest at RIGHT inferior pole questionably demonstrating mural nodularity, overall lesion 4.1 x 4.1 x 3.6 cm.  4 mm low-attenuation focus anteriorly in medial segment LEFT lobe liver image 21 unchanged.  Question new 6 mm low-attenuation nodule medial segment LEFT lobe liver image 21.  Remainder of liver, spleen, pancreas, kidneys, and adrenal glands normal appearance.  Central low attenuation within uterus could be related to phase of menses.  Unremarkable bladder, ureters, and adnexae.  Normal appendix.  Mass at distal descending colon 3.8 x 4.1 cm image 59, approximately 5.8 cm length.  9 mm short axis lymph node in mesentery medial to the descending colon a previously 10 mm.  Stomach and bowel loops otherwise unremarkable.  No mass, adenopathy, free fluid or  free air.  Mild scattered atherosclerotic calcification.  No acute osseous findings.  IMPRESSION: 3.8 x 4.1 x 5.8 cm diameter distal descending colon mass compatible with a primary colonic malignancy.  9 mm short axis lymph node in the adjacent colonic mesenteric.  BILATERAL renal cysts, largest of which at the RIGHT kidney shows questionable area of mural nodularity; followup nonemergent characterization of this lesion by MR imaging with and without contrast recommended to exclude cystic renal neoplasm.  Two nonspecific low-attenuation foci within liver.   Electronically Signed   By: MaLavonia Dana.D.   On: 03/04/2014 11:12   Dg Chest Port 1 View  03/03/2014   CLINICAL DATA:  Shortness of breath with exertion  EXAM: PORTABLE CHEST - 1 VIEW  COMPARISON:  None.  FINDINGS: Moderate cardiac enlargement similar to prior study. Mild vascular congestion with no evidence of edema or consolidation.  IMPRESSION: Cardiac enlargement with minimal vascular congestion and no pulmonary edema   Electronically Signed   By: RaSkipper Cliche.D.   On: 03/03/2014 19:07    Medications / Allergies: per chart  Antibiotics: Anti-infectives   None       Note: This dictation was prepared with voice recognition software technology. In this process, transcriptional errors may occur.  Attempts are made to proofread & provide accurate documentation.  Any errors are unintentional.

## 2014-03-06 ENCOUNTER — Inpatient Hospital Stay (HOSPITAL_COMMUNITY): Payer: BC Managed Care – PPO

## 2014-03-06 DIAGNOSIS — A498 Other bacterial infections of unspecified site: Secondary | ICD-10-CM

## 2014-03-06 DIAGNOSIS — N390 Urinary tract infection, site not specified: Secondary | ICD-10-CM

## 2014-03-06 DIAGNOSIS — B9620 Unspecified Escherichia coli [E. coli] as the cause of diseases classified elsewhere: Secondary | ICD-10-CM | POA: Diagnosis present

## 2014-03-06 LAB — CBC
HCT: 28.6 % — ABNORMAL LOW (ref 36.0–46.0)
HEMATOCRIT: 27 % — AB (ref 36.0–46.0)
Hemoglobin: 8.2 g/dL — ABNORMAL LOW (ref 12.0–15.0)
Hemoglobin: 8.8 g/dL — ABNORMAL LOW (ref 12.0–15.0)
MCH: 28.7 pg (ref 26.0–34.0)
MCH: 29 pg (ref 26.0–34.0)
MCHC: 30.4 g/dL (ref 30.0–36.0)
MCHC: 30.8 g/dL (ref 30.0–36.0)
MCV: 94.4 fL (ref 78.0–100.0)
MCV: 94.4 fL (ref 78.0–100.0)
PLATELETS: 308 10*3/uL (ref 150–400)
Platelets: 273 10*3/uL (ref 150–400)
RBC: 2.86 MIL/uL — ABNORMAL LOW (ref 3.87–5.11)
RBC: 3.03 MIL/uL — ABNORMAL LOW (ref 3.87–5.11)
RDW: 15.3 % (ref 11.5–15.5)
RDW: 15.2 % (ref 11.5–15.5)
WBC: 9.8 10*3/uL (ref 4.0–10.5)
WBC: 11.3 10*3/uL — AB (ref 4.0–10.5)

## 2014-03-06 LAB — URINE CULTURE: Colony Count: >=100000

## 2014-03-06 LAB — BASIC METABOLIC PANEL
BUN: 9 mg/dL (ref 6–23)
CO2: 23 mEq/L (ref 19–32)
Calcium: 8.7 mg/dL (ref 8.4–10.5)
Chloride: 106 mEq/L (ref 96–112)
Creatinine, Ser: 0.91 mg/dL (ref 0.50–1.10)
GFR, EST AFRICAN AMERICAN: 80 mL/min — AB (ref >90)
GFR, EST NON AFRICAN AMERICAN: 69 mL/min — AB (ref >90)
Glucose, Bld: 94 mg/dL (ref 70–99)
Potassium: 3.8 mEq/L (ref 3.7–5.3)
SODIUM: 141 meq/L (ref 137–147)

## 2014-03-06 MED ORDER — DEXTROSE 5 % IV SOLN
1.0000 g | INTRAVENOUS | Status: DC
  Administered 2014-03-06: 1 g via INTRAVENOUS
  Filled 2014-03-06 (×2): qty 10

## 2014-03-06 MED ORDER — GADOBENATE DIMEGLUMINE 529 MG/ML IV SOLN
20.0000 mL | Freq: Once | INTRAVENOUS | Status: AC | PRN
  Administered 2014-03-06: 20 mL via INTRAVENOUS

## 2014-03-06 MED ORDER — HEPARIN (PORCINE) IN NACL 100-0.45 UNIT/ML-% IJ SOLN
1500.0000 [IU]/h | INTRAMUSCULAR | Status: DC
  Administered 2014-03-06: 1500 [IU]/h via INTRAVENOUS
  Filled 2014-03-06: qty 250

## 2014-03-06 NOTE — Care Management Note (Addendum)
Page 1 of 1   03/10/2014     2:02:54 PM CARE MANAGEMENT NOTE 03/10/2014  Patient:  Veronica Reyes, Veronica Reyes   Account Number:  [id number]  Date Initiated:  03/06/2014  Documentation initiated by:  Dessa Phi  Subjective/Objective Assessment:   58 Y/O F ADMITTED W/ANEMIA.FW:YOVZC CA.     Action/Plan:   FROM HOME.   Anticipated DC Date:  03/13/2014   Anticipated DC Plan:  [place]  CM consult      Choice offered to / List presented to:             Status of service:  In process, will continue to follow Medicare Important Message given?   (If response is "NO", the following Medicare IM given date fields will be blank) Date Medicare IM given:   Date Additional Medicare IM given:    Discharge Disposition:    Per UR Regulation:  Reviewed for med. necessity/level of care/duration of stay  If discussed at [place] of Stay Meetings, dates discussed:   03/09/2014    Comments:  03/10/14 KATHY MAHABIR RN,BSN NCM [phone] POD#1 L COLECTOMY.CLEARS.PATH PENDING.MORPHINE IV PCA.NO ANTICIPATED D/C NEEDS.  03/09/14 KATHY MAHABIR RN,BSN NCM [phone] COLECTOMY TODAY.D/C PLAN HOME.  03/07/14 KATHY MAHABIR RN,BSN NCM [phone] CLEARS TODAY.BOWEL PREP IN AM.SX ON THURS-COLON MASS RESECTION.RE STARTED HEP GTT.  03/06/14 KATHY MAHABIR RN,BSN NCM [phone] MINIMAL SPOTTING.GYN FOLLOWING-ENDOMETRIAL [facility]- FOLLOWING FOR SX ON THURS.RESTART IV HEPARIN.PATIENT HAS SCRIPT COVERAGE,USES-[facility]ARM[facility]Y ON [address]([facility]).WOULD RECOMMEND [facility] STARTER KIT @ D/C IF NEEDED.

## 2014-03-06 NOTE — Progress Notes (Signed)
Patient ID: Veronica Reyes, female   DOB: December 22, 1955, 58 y.o.   MRN: [id number]    Subjective: Pt feels well today.  Less SOB.  Eating well.  Minimal spotting currently.  No further rectal bleeding  Objective: Vital signs in last 24 hours: Temp:  [97.7 F (36.5 C)-99 F (37.2 C)] 98.3 F (36.8 C) (06/22 0523) Pulse Rate:  [73-81] 76 (06/22 0523) Resp:  [18-20] 18 (06/22 0523) BP: (99-118)/(59-67) 99/63 mmHg (06/22 0523) SpO2:  [100 %] 100 % (06/22 0523) Weight:  [293 lb 9.1 oz (133.162 kg)] 293 lb 9.1 oz (133.162 kg) (06/22 0523) Last BM Date: 03/05/14  Intake/Output from previous day: 06/21 0701 - 06/22 0700 In: 1800 [P.O.:1800] Out: -  Intake/Output this shift:    PE: Abd: soft, NT, ND, +BS, obese Heart: regular LungS: CTAB  Lab Results:   Recent Labs  03/05/14 1504 03/06/14 0346  WBC 9.1 9.8  HGB 8.6* 8.2*  HCT 27.6* 27.0*  PLT 318 273   BMET  Recent Labs  03/05/14 0353 03/06/14 0346  NA 140 141  K 3.6* 3.8  CL 103 106  CO2 26 23  GLUCOSE 95 94  BUN 7 9  CREATININE 0.95 0.91  CALCIUM 9.0 8.7   PT/INR  Recent Labs  03/03/14 1829  LABPROT 20.0*  INR 1.76*   CMP     Component Value Date/Time   NA 141 03/06/2014 0346   K 3.8 03/06/2014 0346   CL 106 03/06/2014 0346   CO2 23 03/06/2014 0346   GLUCOSE 94 03/06/2014 0346   BUN 9 03/06/2014 0346   CREATININE 0.91 03/06/2014 0346   CALCIUM 8.7 03/06/2014 0346   PROT 6.8 03/03/2014 1831   ALBUMIN 2.9* 03/03/2014 1831   AST 37 03/03/2014 1831   ALT 30 03/03/2014 1831   ALKPHOS 38* 03/03/2014 1831   BILITOT 0.2* 03/03/2014 1831   GFRNONAA 69* 03/06/2014 0346   GFRAA 80* 03/06/2014 0346   Lipase  No results found for this basename: lipase       Studies/Results: Mr Abdomen W Wo Contrast  03/06/2014   CLINICAL DATA:  Weakness. Anemia. History of breast cancer. Evaluate renal cyst.  EXAM: MRI ABDOMEN WITHOUT AND WITH CONTRAST  TECHNIQUE: Multiplanar multisequence MR imaging of the abdomen was  performed both before and after the administration of intravenous contrast.  CONTRAST:  61mL MULTIHANCE GADOBENATE DIMEGLUMINE 529 MG/ML IV SOLN  COMPARISON:  CT of the abdomen and pelvis 03/04/2014.  FINDINGS: There are several lesions in the kidneys bilaterally which are low signal intensity on T1 weighted images, high signal intensity on T2 weighted images, and do not enhance, compatible with simple cysts. The largest simple cyst measures 2.9 cm in diameter in the medial aspect of the interpolar region of the left kidney. The largest renal lesion overall is in the interpolar region of the right kidney measuring 4.4 x 3.7 cm. This lesion is also generally low T1 signal intensity and high T2 signal intensity, without internal enhancement. However, there is some very subtle nodularity along the anterior margin of the lesion best appreciated on image 37 of series 8, which corresponds to some minimal enhancement on post gadolinium images, best appreciated on image 59 of series 1102 (this area of potential mural nodularity is favored to be normal adjacent renal parenchyma rather than a true mural nodule). This lesion also has multiple thin internal nonenhancing septations, which are best appreciated on coronal T2 weighted images (image 29 of series 9). Diffusion-weighted sequences demonstrate some  diffusion restriction (lesion is dark on ADC map).  In addition, there is some amorphous soft tissue located beneath segment 1 of the liver, posterior to the portal vein, anterior to the IVC and upper pole of the right kidney, posterior the second portion of the duodenum and immediately above and posterior to the head and uncinate process of the pancreas. This amorphous soft tissue is intimately associated with all adjacent structures, and is T2 hyperintense on respiratory triggered fat sat T2 weighted images (best appreciated on images 24-30 of series 4), hypointense spine T1 weighted images compared to normal pancreatic and  hepatic parenchyma, demonstrates diffusion restriction (images 18 of series 3 and 21 of series 300), and demonstrates slightly differential enhancement when compared to either the hepatic or pancreatic parenchyma (best appreciated on subtraction image 37 of series 11102).  There are 2 tiny sub cm lesions in the liver which are low T1 signal intensity, high T2 signal intensity, and do not enhance, compatible with tiny simple cysts or biliary hamartomas. Status post cholecystectomy. The appearance of the spleen and bilateral adrenal glands is unremarkable.  IMPRESSION: 1. The lesion of concern in the lateral aspect of the interpolar region of the right kidney has indeterminate imaging characteristics, but is favored to be benign, and is currently classified as a Bosniak class IIF lesion. Repeat MRI with and without IV gadolinium in 6 months is recommended to ensure stability. This recommendation follows ACR consensus guidelines: Managing Incidental Findings on Abdominal CT: White Paper of the ACR Incidental Findings Committee. J Am Coll Radiol 2010;7:754-773. 2. In addition, there is some amorphous soft tissue in the portacaval and hepatoduodenal ligament regions which has indeterminate but potentially malignant imaging characteristics (with diffusion restriction). This is of uncertain etiology and significance, but could represent a lymphoma, or potentially nodal metastatic disease. Correlation with PET-CT is recommended to assess for hypermetabolism. Should this lesion prove to be hypermetabolic on PET-CT, further evaluation with endoscopic ultrasound and biopsy would be suggested. 3. Multiple additional simple renal cysts. These results will be called to the ordering clinician or representative by the Radiologist Assistant, and communication documented in the PACS or zVision Dashboard.   Electronically Signed   By: Vinnie Langton M.D.   On: 03/06/2014 08:57   US Transvaginal Non-ob  03/04/2014   CLINICAL DATA:   Vaginal bleeding  EXAM: TRANSABDOMINAL AND TRANSVAGINAL ULTRASOUND OF PELVIS  TECHNIQUE: Both transabdominal and transvaginal ultrasound examinations of the pelvis were performed. Transabdominal technique was performed for global imaging of the pelvis including uterus, ovaries, adnexal regions, and pelvic cul-de-sac. It was necessary to proceed with endovaginal exam following the transabdominal exam to visualize the endometrium.  COMPARISON:  None  FINDINGS: Uterus  Measurements: 9.1 x 7.2 x 7.1 cm. Uterus is heterogeneous, with 3 mildly hypoechoic mass is identified consistent fibroids the largest measuring 2.6 cm. In the fundus there is an intramural fibroid measuring 2.2 cm. The 2.6 cm fibroid is seen in the subendometrial position in the anterior body of the uterus, with an adjacent 1.2 cm fibroid. The uterus is retroverted.  Endometrium  Thickness: 14 mm.  Heterogeneous echotexture  Right ovary  Measurements: 30 x 20 x 18 mm. Normal appearance/no adnexal mass.  Left ovary  Not visualized  Other findings  No free fluid.  IMPRESSION: 1. 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 2. Heterogeneous prominent endometrium which could be due to hyperplasia, polyp, or carcinoma.   Electronically  Signed   By: Skipper Cliche M.D.   On: 03/04/2014 09:36   US Pelvis Complete  03/04/2014   CLINICAL DATA:  Vaginal bleeding  EXAM: TRANSABDOMINAL AND TRANSVAGINAL ULTRASOUND OF PELVIS  TECHNIQUE: Both transabdominal and transvaginal ultrasound examinations of the pelvis were performed. Transabdominal technique was performed for global imaging of the pelvis including uterus, ovaries, adnexal regions, and pelvic cul-de-sac. It was necessary to proceed with endovaginal exam following the transabdominal exam to visualize the endometrium.  COMPARISON:  None  FINDINGS: Uterus  Measurements: 9.1 x 7.2 x 7.1 cm. Uterus is heterogeneous, with 3 mildly hypoechoic  mass is identified consistent fibroids the largest measuring 2.6 cm. In the fundus there is an intramural fibroid measuring 2.2 cm. The 2.6 cm fibroid is seen in the subendometrial position in the anterior body of the uterus, with an adjacent 1.2 cm fibroid. The uterus is retroverted.  Endometrium  Thickness: 14 mm.  Heterogeneous echotexture  Right ovary  Measurements: 30 x 20 x 18 mm. Normal appearance/no adnexal mass.  Left ovary  Not visualized  Other findings  No free fluid.  IMPRESSION: 1. 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 2. Heterogeneous prominent endometrium which could be due to hyperplasia, polyp, or carcinoma.   Electronically Signed   By: Skipper Cliche M.D.   On: 03/04/2014 09:36   Ct Abdomen Pelvis W Contrast  03/04/2014   CLINICAL DATA:  Generalized weakness, tiredness, tachycardia, anemia, bladder and bowels, menorrhagia, history breast cancer post radiation therapy, colon cancer pre colectomy, hypertension  EXAM: CT ABDOMEN AND PELVIS WITH CONTRAST  TECHNIQUE: Multidetector CT imaging of the abdomen and pelvis was performed using the standard protocol following bolus administration of intravenous contrast. Sagittal and coronal MPR images reconstructed from axial data set.  CONTRAST:  64mL OMNIPAQUE IOHEXOL 300 MG/ML SOLN, 154mL OMNIPAQUE IOHEXOL 300 MG/ML SOLN  COMPARISON:  01/06/2014  FINDINGS: Lung bases clear.  Gallbladder surgically absent.  BILATERAL renal cysts, largest at RIGHT inferior pole questionably demonstrating mural nodularity, overall lesion 4.1 x 4.1 x 3.6 cm.  4 mm low-attenuation focus anteriorly in medial segment LEFT lobe liver image 21 unchanged.  Question new 6 mm low-attenuation nodule medial segment LEFT lobe liver image 21.  Remainder of liver, spleen, pancreas, kidneys, and adrenal glands normal appearance.  Central low attenuation within uterus could be related to phase of menses.   Unremarkable bladder, ureters, and adnexae.  Normal appendix.  Mass at distal descending colon 3.8 x 4.1 cm image 59, approximately 5.8 cm length.  9 mm short axis lymph node in mesentery medial to the descending colon a previously 10 mm.  Stomach and bowel loops otherwise unremarkable.  No mass, adenopathy, free fluid or free air.  Mild scattered atherosclerotic calcification.  No acute osseous findings.  IMPRESSION: 3.8 x 4.1 x 5.8 cm diameter distal descending colon mass compatible with a primary colonic malignancy.  9 mm short axis lymph node in the adjacent colonic mesenteric.  BILATERAL renal cysts, largest of which at the RIGHT kidney shows questionable area of mural nodularity; followup nonemergent characterization of this lesion by MR imaging with and without contrast recommended to exclude cystic renal neoplasm.  Two nonspecific low-attenuation foci within liver.   Electronically Signed   By: Lavonia Dana M.D.   On: 03/04/2014 11:12    Anti-infectives: Anti-infectives   None       Assessment/Plan  1. Colon mass 2. Endometrial thickening  with vaginal bleeding 3. Kidney cysts 4. PE  Plan: 1. Will need to d/w Dr. Marcello Moores regarding plans.  Patient is scheduled for surgery on 03-09-14.  Dr. Hulan Fray is to see patient today and possibly plans for endometrial biopsy.  Timing of surgery may or may not change pending these decisions.  Will defer to Dr. Marcello Moores on timing of surgery, whether that remains or changes.   LOS: 3 days    Daesia Zylka E 03/06/2014, 9:04 AM Pager: [phone]

## 2014-03-06 NOTE — Progress Notes (Signed)
ATTENDING ADDENDUM:  I personally reviewed patient's record, examined the patient, and formulated the following assessment and plan:  Pt scheduled for surgery Thu for colon cancer.  Will await Dr Alease Medina findings.  Discussed with Dr Grandville Silos.  Will restart Hep gtt after endometrial biopsy.  Will plan on keeping her in house to monitor for bleeding and perform surgery as scheduled on Thu.  Could add hysterectomy with Dr Hulan Fray if needed.    MRI results of renal cysts noted.  Will pass along info to PCP for follow up   Rosario Adie, MD  [facility]

## 2014-03-06 NOTE — Progress Notes (Signed)
  UPT negative, consent signed, time out done Cervix prepped with betadine and grasped with a single tooth tenaculum Uterus sounded to 911 cm Pipelle used for 3 passes with a large amount of tissue and old blood obtained. She tolerated the procedure well.  If her bleeding resumes in the future, I would suggest giving her megace 20 mg daily.  She can follow up in my office in several months at her convenience.

## 2014-03-06 NOTE — Progress Notes (Signed)
ANTICOAGULATION CONSULT NOTE - Initial Consult  Pharmacy Consult for Heparin Indication: Hx PE  No Known Allergies  Patient Measurements: Height: 5\' 6"  (167.6 cm) Weight: 293 lb 9.1 oz (133.162 kg) IBW/kg (Calculated) : 59.3 Heparin Dosing Weight: 89kg  Vital Signs: Temp: 97.8 F (36.6 C) (06/22 2107) Temp src: Oral (06/22 2107) BP: 131/79 mmHg (06/22 2107) Pulse Rate: 77 (06/22 2107)  Labs:  Recent Labs  03/04/14 0915  03/05/14 0353 03/05/14 1504 03/06/14 0346  HGB 9.1*  < > 8.2* 8.6* 8.2*  HCT 29.3*  < > 26.3* 27.6* 27.0*  PLT 320  < > 331 318 273  CREATININE 0.87  --  0.95  --  0.91  < > = values in this interval not displayed.  Estimated Creatinine Clearance: 95.7 ml/min (by C-G formula based on Cr of 0.91).   Medical History: Past Medical History  Diagnosis Date  . Hypertension   . Cancer     left breast cancer x2  . Breast CA     radiation and surgery  . Shortness of breath     sob with exertion. dx. with Pulmonary emboli 4'15 ,tx. Xarelto,Lovenox used for [employer].  . Pulmonary embolism     "blood clot in lungs" -dx. 4'15 with CT Chest.    Medications:  Scheduled:  . cefTRIAXone (ROCEPHIN)  IV  1 g Intravenous Q24H  . ferrous sulfate  325 mg Oral TID WC  . metoprolol  50 mg Oral BID  . pantoprazole  40 mg Oral Q0600  . sodium chloride  3 mL Intravenous Q12H   Infusions:   PRN: acetaminophen, acetaminophen, ondansetron (ZOFRAN) IV, ondansetron, zolpidem  Assessment:  70 yof h/o PE in April 2015 and breast cancer.  Admitted with rectal and vaginal bleeding (Hgb 5.8) on xarelto 15 mg BID prior to admission.  Patients last dose was 6/19. Xarelto was held on admission secondary to vaginal bleeding.   Patient had endometrial biopsy today with plans to start IV heparin after biopsy.  Pharmacy asked to continue to hold xarelto and restart IV heparin gtt tonight in anticipation of surgery on Thursday.   CBC stable/okay today to resume heparin.     Renal  function is WNL, CrCl 95 ml/min     Goal of Therapy:  Heparin level 0.3-0.7 units/ml Monitor platelets by anticoagulation protocol: Yes   Plan:  Start Heparin infusion at 1500 units/hr, NO bolus Obtain Heparin level 6 hours after initiation of infusion and adjust as indicated Follow daily heparin levels & CBC  Borgerding, Gaye Alken PharmD Pager #: [phone] 9:36 PM 03/06/2014

## 2014-03-06 NOTE — Progress Notes (Signed)
[facility] PROGRESS NOTE  Veronica Reyes NWG:[id number] DOB: Jun 17, 1956 DOA: 03/03/2014 PCP: [name]., FNP  Assessment/Plan: #1 symptomatic anemia/acute blood loss anemia Likely largely secondary to metromenorrhagia with Hemoccults positive stools without overt GI bleed in the setting of anticoagulation of xarelto. Patient stated has had irregular cycles with menses every 3-4 months. Prior to this the last menses was October 2014. Patient with vaginal bleeding with clots yesterday on admission with last prior episode 6 days prior to admission. Minimal vaginal bleeding. Admission hemoglobin was 5.8. Patient is status post 3 units packed red blood cells. Hemoglobin currently at 8.2. Continue to hold anticoagulation. Transfusion threshold hemoglobin less than 8. Once endometrial biopsy has been performed patient be started on a heparin drip. Follow H&H.   #2 menorrhagia with irregular cycle Patient states has been having her menses every 3-4 months. Last was in October 2014 prior to this past month. Patient stated that had menses approximately 2 weeks prior to admission with heavy clots which subsided and had initially resolved 6 days prior to admission. Patient restarted  having vaginal bleeding while being transfused second unit of packed red blood cells 2 days ago. Patient with minimal vaginal bleeding. Pelvic ultrasound with fibroids and a thickened endometrium. Spoke with Dr. Hulan Fray, GYN who will assess the patient and likely perform an endometrial biopsy today for further evaluation. Once endometrial biopsy has been performed will start patient on heparin drip.   #3 hypertension Stable. Continue metoprolol.  #4 recent history of PE diagnosed 4/24 2015 Patient was on xarelto prior to admission which is on hold secondary to problems #1 and 2. Lower extremity Dopplers were negative for DVT. Patient with significant improvement in menorrhagia. Once endometrial biopsy has been  obtained will start patient on a heparin drip.   #5 colon cancer Patient is being followed by general surgery. Heparin will be started after endometrial biopsy. Patient scheduled for surgery on Thursday 03/09/2014. [facility] ff .  #6 history of breast cancer status post radiation and surgery.  #7 bilateral renal cysts CT abdomen and pelvis with bilateral renal cysts with largest on the right kidney with questionable area of mural nodularity. MRI of abdomen with renal cysts. Will likely need repeat MRI in 6 months as well as probable PET/CT scan. Outpatient followup.   #8 Escherichia coli UTI Will start IV Rocephin. Sensitivities pending.  #9 prophylaxis PPI for GI prophylaxis. SCDs for DVT prophylaxis.    Code Status: Full Family Communication: Updated patient no family present. Disposition Plan: Remain inpatient   Consultants:  General surgery: Dr. Johney Maine 03/05/2014   GYN Dr. Hulan Fray pending  Procedures:  Chest x-ray 03/03/2014  Status post 3 units packed red blood cells 6/19-6/20/2015  CT abdomen and pelvis 03/04/2014  Pelvic ultrasound 03/04/2014  MRI abdomen 03/06/2014  Antibiotics:  IV Rocephin 03/06/2014  HPI/Subjective: Patient states she's feeling much better. Patient denies any chest pain. No shortness of breath. Patient states had some vaginal spotting. Patient states significant improvement in vaginal bleeding.  Objective: Filed Vitals:   03/06/14 1331  BP: 127/72  Pulse: 70  Temp: 98.2 F (36.8 C)  Resp: 21    Intake/Output Summary (Last 24 hours) at 03/06/14 1403 Last data filed at 03/06/14 0800  Gross per 24 hour  Intake   1330 ml  Output      0 ml  Net   1330 ml   Filed Weights   03/04/14 0517 03/05/14 0615 03/06/14 0523  Weight: 134.945 kg (297 lb 8 oz) 133.358 kg (  294 lb) 133.162 kg (293 lb 9.1 oz)    Exam:   General:  NAD  Cardiovascular: RRR  Respiratory: Clear to auscultation bilaterally. No wheezes, no crackles, no  rhonchi.  Abdomen: Obese, soft, nontender, nondistended, positive bowel sounds.  Musculoskeletal: No clubbing cyanosis or edema  Data Reviewed: Basic Metabolic Panel:  Recent Labs Lab 03/03/14 1510 03/04/14 0915 03/05/14 0353 03/06/14 0346  NA 135* 138 140 141  K 3.3* 3.6* 3.6* 3.8  CL 97 100 103 106  CO2 25 24 26 23   GLUCOSE 105* 110* 95 94  BUN 7 7 7 9   CREATININE 0.76 0.87 0.95 0.91  CALCIUM 9.2 8.9 9.0 8.7   Liver Function Tests:  Recent Labs Lab 03/03/14 1831  AST 37  ALT 30  ALKPHOS 38*  BILITOT 0.2*  PROT 6.8  ALBUMIN 2.9*   No results found for this basename: LIPASE, AMYLASE,  in the last 168 hours No results found for this basename: AMMONIA,  in the last 168 hours CBC:  Recent Labs Lab 03/04/14 0313 03/04/14 0915 03/04/14 1705 03/05/14 0353 03/05/14 1504 03/06/14 0346  WBC 11.0* 12.9* 10.8* 9.7 9.1 9.8  NEUTROABS 6.9  --   --   --   --   --   HGB 7.3* 9.1* 8.8* 8.2* 8.6* 8.2*  HCT 24.3* 29.3* 28.0* 26.3* 27.6* 27.0*  MCV 95.3 93.6 91.8 92.6 93.2 94.4  PLT 330 320 324 331 318 273   Cardiac Enzymes: No results found for this basename: CKTOTAL, CKMB, CKMBINDEX, TROPONINI,  in the last 168 hours BNP (last 3 results) No results found for this basename: PROBNP,  in the last 8760 hours CBG: No results found for this basename: GLUCAP,  in the last 168 hours  Recent Results (from the past 240 hour(s))  URINE CULTURE     Status: None   Collection Time    03/04/14 12:26 PM      Result Value Ref Range Status   Specimen Description URINE, CLEAN CATCH   Final   Special Requests NONE   Final   Culture  Setup Time     Final   Value: 03/04/2014 19:01     Performed at [facility]     Final   Value: >=100,000 COLONIES/ML     Performed at [facility]   Culture     Final   Value: ESCHERICHIA COLI     Performed at [facility]   Report Status PENDING   Incomplete     Studies: Mr Abdomen W Wo  Contrast  03/06/2014   CLINICAL DATA:  Weakness. Anemia. History of breast cancer. Evaluate renal cyst.  EXAM: MRI ABDOMEN WITHOUT AND WITH CONTRAST  TECHNIQUE: Multiplanar multisequence MR imaging of the abdomen was performed both before and after the administration of intravenous contrast.  CONTRAST:  40mL MULTIHANCE GADOBENATE DIMEGLUMINE 529 MG/ML IV SOLN  COMPARISON:  CT of the abdomen and pelvis 03/04/2014.  FINDINGS: There are several lesions in the kidneys bilaterally which are low signal intensity on T1 weighted images, high signal intensity on T2 weighted images, and do not enhance, compatible with simple cysts. The largest simple cyst measures 2.9 cm in diameter in the medial aspect of the interpolar region of the left kidney. The largest renal lesion overall is in the interpolar region of the right kidney measuring 4.4 x 3.7 cm. This lesion is also generally low T1 signal intensity and high T2 signal intensity, without internal enhancement. However,  there is some very subtle nodularity along the anterior margin of the lesion best appreciated on image 37 of series 8, which corresponds to some minimal enhancement on post gadolinium images, best appreciated on image 59 of series 1102 (this area of potential mural nodularity is favored to be normal adjacent renal parenchyma rather than a true mural nodule). This lesion also has multiple thin internal nonenhancing septations, which are best appreciated on coronal T2 weighted images (image 29 of series 9). Diffusion-weighted sequences demonstrate some diffusion restriction (lesion is dark on ADC map).  In addition, there is some amorphous soft tissue located beneath segment 1 of the liver, posterior to the portal vein, anterior to the IVC and upper pole of the right kidney, posterior the second portion of the duodenum and immediately above and posterior to the head and uncinate process of the pancreas. This amorphous soft tissue is intimately associated with  all adjacent structures, and is T2 hyperintense on respiratory triggered fat sat T2 weighted images (best appreciated on images 24-30 of series 4), hypointense spine T1 weighted images compared to normal pancreatic and hepatic parenchyma, demonstrates diffusion restriction (images 18 of series 3 and 21 of series 300), and demonstrates slightly differential enhancement when compared to either the hepatic or pancreatic parenchyma (best appreciated on subtraction image 37 of series 11102).  There are 2 tiny sub cm lesions in the liver which are low T1 signal intensity, high T2 signal intensity, and do not enhance, compatible with tiny simple cysts or biliary hamartomas. Status post cholecystectomy. The appearance of the spleen and bilateral adrenal glands is unremarkable.  IMPRESSION: 1. The lesion of concern in the lateral aspect of the interpolar region of the right kidney has indeterminate imaging characteristics, but is favored to be benign, and is currently classified as a Bosniak class IIF lesion. Repeat MRI with and without IV gadolinium in 6 months is recommended to ensure stability. This recommendation follows ACR consensus guidelines: Managing Incidental Findings on Abdominal CT: White Paper of the ACR Incidental Findings Committee. J Am Coll Radiol 2010;7:754-773. 2. In addition, there is some amorphous soft tissue in the portacaval and hepatoduodenal ligament regions which has indeterminate but potentially malignant imaging characteristics (with diffusion restriction). This is of uncertain etiology and significance, but could represent a lymphoma, or potentially nodal metastatic disease. Correlation with PET-CT is recommended to assess for hypermetabolism. Should this lesion prove to be hypermetabolic on PET-CT, further evaluation with endoscopic ultrasound and biopsy would be suggested. 3. Multiple additional simple renal cysts. These results will be called to the ordering clinician or representative by  the Radiologist Assistant, and communication documented in the PACS or zVision Dashboard.   Electronically Signed   By: Vinnie Langton M.D.   On: 03/06/2014 08:57    Scheduled Meds: . cefTRIAXone (ROCEPHIN)  IV  1 g Intravenous Q24H  . ferrous sulfate  325 mg Oral TID WC  . metoprolol  50 mg Oral BID  . pantoprazole  40 mg Oral Q0600  . sodium chloride  3 mL Intravenous Q12H   Continuous Infusions:   Principal Problem:   Symptomatic anemia Active Problems:   Menorrhagia with irregular cycle   Acute blood loss anemia   Pulmonary embolism   Breast cancer   Essential hypertension, benign   History of BRBPR (bright red blood per rectum)   Colon cancer   Fibroids   E. coli UTI (urinary tract infection)    Time spent: 55 minutes    THOMPSON,DANIEL M.D. [facility]  Pager [phone]. If 7PM-7AM, please contact night-coverage at [email or web address], password [facility] 03/06/2014, 2:03 PM  LOS: 3 days

## 2014-03-07 DIAGNOSIS — C189 Malignant neoplasm of colon, unspecified: Secondary | ICD-10-CM

## 2014-03-07 DIAGNOSIS — D649 Anemia, unspecified: Secondary | ICD-10-CM

## 2014-03-07 LAB — CBC
HCT: 27 % — ABNORMAL LOW (ref 36.0–46.0)
Hemoglobin: 8.2 g/dL — ABNORMAL LOW (ref 12.0–15.0)
MCH: 28.8 pg (ref 26.0–34.0)
MCHC: 30.4 g/dL (ref 30.0–36.0)
MCV: 94.7 fL (ref 78.0–100.0)
PLATELETS: 304 10*3/uL (ref 150–400)
RBC: 2.85 MIL/uL — ABNORMAL LOW (ref 3.87–5.11)
RDW: 15.3 % (ref 11.5–15.5)
WBC: 10.3 10*3/uL (ref 4.0–10.5)

## 2014-03-07 LAB — HEPARIN LEVEL (UNFRACTIONATED)
HEPARIN UNFRACTIONATED: 0.26 [IU]/mL — AB (ref 0.30–0.70)
HEPARIN UNFRACTIONATED: 0.36 [IU]/mL (ref 0.30–0.70)

## 2014-03-07 MED ORDER — CIPROFLOXACIN HCL 500 MG PO TABS
500.0000 mg | ORAL_TABLET | Freq: Two times a day (BID) | ORAL | Status: DC
  Administered 2014-03-07 – 2014-03-08 (×4): 500 mg via ORAL
  Filled 2014-03-07 (×7): qty 1

## 2014-03-07 MED ORDER — HEPARIN (PORCINE) IN NACL 100-0.45 UNIT/ML-% IJ SOLN
1650.0000 [IU]/h | INTRAMUSCULAR | Status: DC
  Administered 2014-03-07 – 2014-03-08 (×3): 1650 [IU]/h via INTRAVENOUS
  Filled 2014-03-07 (×6): qty 250

## 2014-03-07 NOTE — Progress Notes (Signed)
CARE MANAGEMENT NOTE 03/07/2014  Patient:  Veronica Reyes,Veronica Reyes   Account Number:  [id number]  Date Initiated:  03/06/2014  Documentation initiated by:  Dessa Phi  Subjective/Objective Assessment:   58 Y/O F ADMITTED W/ANEMIA.AC:ZYSAY CA.     Action/Plan:   FROM HOME.   Anticipated DC Date:  03/09/2014   Anticipated DC Plan:  [place]  CM consult      Choice offered to / List presented to:             Status of service:  In process, will continue to follow Medicare Important Message given?   (If response is "NO", the following Medicare IM given date fields will be blank) Date Medicare IM given:   Date Additional Medicare IM given:    Discharge Disposition:    Per UR Regulation:  Reviewed for med. necessity/level of care/duration of stay  If discussed at [place] of Stay Meetings, dates discussed:    Comments:  03/07/14 Diarra Kos RN,BSN NCM [phone] CLEARS TODAY.BOWEL PREP IN AM.SX ON THURS-COLON MASS RESECTION.RE STARTED HEP GTT.  03/06/14 Caleel Kiner RN,BSN NCM [phone] MINIMAL SPOTTING.GYN FOLLOWING-ENDOMETRIAL BX,[facility]- FOLLOWING FOR SX ON THURS.RESTART IV HEPARIN.PATIENT HAS SCRIPT COVERAGE,USES-[facility] PHARMACY ON [address](XARELTO).WOULD RECOMMEND XARELTO STARTER KIT @ D/C IF NEEDED.

## 2014-03-07 NOTE — Progress Notes (Signed)
ANTICOAGULATION CONSULT NOTE - Initial Consult  Pharmacy Consult for Heparin Indication: Hx PE  No Known Allergies  Patient Measurements: Height: 5\' 6"  (167.6 cm) Weight: 293 lb 3.2 oz (132.995 kg) IBW/kg (Calculated) : 59.3 Heparin Dosing Weight: 89kg  Vital Signs: Temp: 98.2 F (36.8 C) (06/23 0529) Temp src: Oral (06/23 0529) BP: 112/55 mmHg (06/23 0529) Pulse Rate: 69 (06/23 0529)  Labs:  Recent Labs  03/05/14 0353  03/06/14 0346 03/06/14 2155 03/07/14 0400 03/07/14 0525 03/07/14 1230  HGB 8.2*  < > 8.2* 8.8* 8.2*  --   --   HCT 26.3*  < > 27.0* 28.6* 27.0*  --   --   PLT 331  < > 273 308 304  --   --   HEPARINUNFRC  --   --   --   --   --  0.26* 0.36  CREATININE 0.95  --  0.91  --   --   --   --   < > = values in this interval not displayed.  Estimated Creatinine Clearance: 95.6 ml/min (by C-G formula based on Cr of 0.91).   Medical History: Past Medical History  Diagnosis Date  . Hypertension   . Cancer     left breast cancer x2  . Breast CA     radiation and surgery  . Shortness of breath     sob with exertion. dx. with Pulmonary emboli 4'15 ,tx. Xarelto,Lovenox used for [employer].  . Pulmonary embolism     "blood clot in lungs" -dx. 4'15 with CT Chest.    Medications:  Scheduled:  . ciprofloxacin  500 mg Oral BID  . ferrous sulfate  325 mg Oral TID WC  . metoprolol  50 mg Oral BID  . pantoprazole  40 mg Oral Q0600  . sodium chloride  3 mL Intravenous Q12H   Infusions:  . heparin 1,650 Units/hr (03/07/14 1054)   PRN: acetaminophen, acetaminophen, ondansetron (ZOFRAN) IV, ondansetron, zolpidem  Assessment:  68 yof h/o PE in April 2015 and breast cancer.  Admitted with rectal and vaginal bleeding (Hgb 5.8) on xarelto 15 mg BID prior to admission.  Patients last dose was 6/19. Xarelto was held on admission secondary to vaginal bleeding.   Patient had endometrial biopsy today with plans to start IV heparin after biopsy.  Pharmacy asked to continue  to hold xarelto and restart IV heparin gtt in anticipation of surgery on Thursday.   CBC stable/okay today to resume heparin.     Renal function is WNL, CrCl 95 ml/min     6/23 0525 heparin level = 0.26 SUBtherapeutic: Infusion increased to 1650 units/hr 6/23 1230 heparin level = 0.36 Therapeutic  Goal of Therapy:  Heparin level 0.3-0.7 units/ml Monitor platelets by anticoagulation protocol: Yes   Plan:  Continue Heparin infusion at 1650 units/hr Follow daily heparin levels & CBC  Julio Sicks PharmD Pager #: [phone] 1:38 PM 03/07/2014

## 2014-03-07 NOTE — Progress Notes (Signed)
Patient ID: Veronica Reyes, female   DOB: 24-Jun-1956, 58 y.o.   MRN: [id number]    Subjective: Pt feels well today.   Eating well. Hep gtt started.  No further rectal or vaginal bleeding.  Endometrial biopsy pending.    Objective: Vital signs in last 24 hours: Temp:  [97.8 F (36.6 C)-98.2 F (36.8 C)] 98.2 F (36.8 C) (06/23 0529) Pulse Rate:  [69-77] 69 (06/23 0529) Resp:  [18-21] 18 (06/23 0529) BP: (112-131)/(55-79) 112/55 mmHg (06/23 0529) SpO2:  [100 %] 100 % (06/23 0529) Weight:  [293 lb 3.2 oz (132.995 kg)] 293 lb 3.2 oz (132.995 kg) (06/23 0529) Last BM Date: 03/05/14  Intake/Output from previous day: 06/22 0701 - 06/23 0700 In: 1047.5 [P.O.:990; I.V.:7.5; IV Piggyback:50] Out: -  Intake/Output this shift:    PE: Abd: soft, NT, ND, +BS, obese Heart: regular LungS: CTAB  Lab Results:   Recent Labs  03/06/14 2155 03/07/14 0400  WBC 11.3* 10.3  HGB 8.8* 8.2*  HCT 28.6* 27.0*  PLT 308 304   BMET  Recent Labs  03/05/14 0353 03/06/14 0346  NA 140 141  K 3.6* 3.8  CL 103 106  CO2 26 23  GLUCOSE 95 94  BUN 7 9  CREATININE 0.95 0.91  CALCIUM 9.0 8.7   PT/INR No results found for this basename: LABPROT, INR,  in the last 72 hours CMP     Component Value Date/Time   NA 141 03/06/2014 0346   K 3.8 03/06/2014 0346   CL 106 03/06/2014 0346   CO2 23 03/06/2014 0346   GLUCOSE 94 03/06/2014 0346   BUN 9 03/06/2014 0346   CREATININE 0.91 03/06/2014 0346   CALCIUM 8.7 03/06/2014 0346   PROT 6.8 03/03/2014 1831   ALBUMIN 2.9* 03/03/2014 1831   AST 37 03/03/2014 1831   ALT 30 03/03/2014 1831   ALKPHOS 38* 03/03/2014 1831   BILITOT 0.2* 03/03/2014 1831   GFRNONAA 69* 03/06/2014 0346   GFRAA 80* 03/06/2014 0346   Lipase  No results found for this basename: lipase       Studies/Results: Mr Abdomen W Wo Contrast  03/06/2014   CLINICAL DATA:  Weakness. Anemia. History of breast cancer. Evaluate renal cyst.  EXAM: MRI ABDOMEN WITHOUT AND WITH CONTRAST   TECHNIQUE: Multiplanar multisequence MR imaging of the abdomen was performed both before and after the administration of intravenous contrast.  CONTRAST:  8mL MULTIHANCE GADOBENATE DIMEGLUMINE 529 MG/ML IV SOLN  COMPARISON:  CT of the abdomen and pelvis 03/04/2014.  FINDINGS: There are several lesions in the kidneys bilaterally which are low signal intensity on T1 weighted images, high signal intensity on T2 weighted images, and do not enhance, compatible with simple cysts. The largest simple cyst measures 2.9 cm in diameter in the medial aspect of the interpolar region of the left kidney. The largest renal lesion overall is in the interpolar region of the right kidney measuring 4.4 x 3.7 cm. This lesion is also generally low T1 signal intensity and high T2 signal intensity, without internal enhancement. However, there is some very subtle nodularity along the anterior margin of the lesion best appreciated on image 37 of series 8, which corresponds to some minimal enhancement on post gadolinium images, best appreciated on image 59 of series 1102 (this area of potential mural nodularity is favored to be normal adjacent renal parenchyma rather than a true mural nodule). This lesion also has multiple thin internal nonenhancing septations, which are best appreciated on coronal T2 weighted images (image  29 of series 9). Diffusion-weighted sequences demonstrate some diffusion restriction (lesion is dark on ADC map).  In addition, there is some amorphous soft tissue located beneath segment 1 of the liver, posterior to the portal vein, anterior to the IVC and upper pole of the right kidney, posterior the second portion of the duodenum and immediately above and posterior to the head and uncinate process of the pancreas. This amorphous soft tissue is intimately associated with all adjacent structures, and is T2 hyperintense on respiratory triggered fat sat T2 weighted images (best appreciated on images 24-30 of series 4),  hypointense spine T1 weighted images compared to normal pancreatic and hepatic parenchyma, demonstrates diffusion restriction (images 18 of series 3 and 21 of series 300), and demonstrates slightly differential enhancement when compared to either the hepatic or pancreatic parenchyma (best appreciated on subtraction image 37 of series 11102).  There are 2 tiny sub cm lesions in the liver which are low T1 signal intensity, high T2 signal intensity, and do not enhance, compatible with tiny simple cysts or biliary hamartomas. Status post cholecystectomy. The appearance of the spleen and bilateral adrenal glands is unremarkable.  IMPRESSION: 1. The lesion of concern in the lateral aspect of the interpolar region of the right kidney has indeterminate imaging characteristics, but is favored to be benign, and is currently classified as a Bosniak class IIF lesion. Repeat MRI with and without IV gadolinium in 6 months is recommended to ensure stability. This recommendation follows ACR consensus guidelines: Managing Incidental Findings on Abdominal CT: White Paper of the ACR Incidental Findings Committee. J Am Coll Radiol 2010;7:754-773. 2. In addition, there is some amorphous soft tissue in the portacaval and hepatoduodenal ligament regions which has indeterminate but potentially malignant imaging characteristics (with diffusion restriction). This is of uncertain etiology and significance, but could represent a lymphoma, or potentially nodal metastatic disease. Correlation with PET-CT is recommended to assess for hypermetabolism. Should this lesion prove to be hypermetabolic on PET-CT, further evaluation with endoscopic ultrasound and biopsy would be suggested. 3. Multiple additional simple renal cysts. These results will be called to the ordering clinician or representative by the Radiologist Assistant, and communication documented in the PACS or zVision Dashboard.   Electronically Signed   By: Vinnie Langton M.D.   On:  03/06/2014 08:57    Anti-infectives: Anti-infectives   Start     Dose/Rate Route Frequency Ordered Stop   03/06/14 1500  cefTRIAXone (ROCEPHIN) 1 g in dextrose 5 % 50 mL IVPB     1 g 100 mL/hr over 30 Minutes Intravenous Every 24 hours 03/06/14 1356         Assessment/Plan  1. Colon mass: will plan on scheduled surgery on Thu.  Clear liquids and bowel prep tom. 2. Endometrial thickening with vaginal bleeding, biopsy pending, if further bleeding will hold hep gtt and manage with Megace per Dr Alease Medina recommendations.   3. Kidney cysts: f/u imaging in 6 months 4. PE- hep gtt restarted  Plan: 1.  LOS: 4 days    THOMAS, ALICIA C. 11/28/4006, 6:76 AM

## 2014-03-07 NOTE — Progress Notes (Signed)
[facility] PROGRESS NOTE  Veronica Reyes YHC:[id number] DOB: 08-Jan-1956 DOA: 03/03/2014 PCP: [name]., FNP  Assessment/Plan: #1 symptomatic anemia/acute blood loss anemia Likely largely secondary to metromenorrhagia with Hemoccults positive stools without overt GI bleed in the setting of anticoagulation of xarelto. Patient stated has had irregular cycles with menses every 3-4 months. Prior to this the last menses was October 2014. Patient with vaginal bleeding with clots yesterday on admission with last prior episode 6 days prior to admission. Minimal vaginal bleeding. Admission hemoglobin was 5.8. Patient is status post 3 units packed red blood cells. Hemoglobin currently at 8.2. Heparin gtt started. Transfusion threshold hemoglobin less than 8.  Follow H&H.   #2 menorrhagia with irregular cycle Patient states has been having her menses every 3-4 months. Last was in October 2014 prior to this past month. Patient stated that had menses approximately 2 weeks prior to admission with heavy clots which subsided and had initially resolved 6 days prior to admission. Patient restarted  having vaginal bleeding while being transfused second unit of packed red blood cells 2 days ago. Patient with minimal vaginal bleeding. Pelvic ultrasound with fibroids and a thickened endometrium. Patient has been seen by Dr. Hulan Fray, GYN and underwent endometrial biopsy yesterday. Biopsy pending. Outpatient f/u with Dr Hulan Fray.   #3 hypertension Stable. Continue metoprolol.  #4 recent history of PE diagnosed 4/24 2015 Patient was on xarelto prior to admission which is on hold secondary to problems #1 and 2. Lower extremity Dopplers were negative for DVT. Patient with significant improvement in menorrhagia. Heparin gtt started. Monitor for bleeding.  #5 colon cancer Patient is being followed by general surgery. Heparin has been started.  Patient scheduled for surgery on Thursday 03/09/2014. CCS ff  .  #6 history of breast cancer status post radiation and surgery.  #7 bilateral renal cysts CT abdomen and pelvis with bilateral renal cysts with largest on the right kidney with questionable area of mural nodularity. MRI of abdomen with renal cysts. Will likely need repeat MRI in 6 months as well as probable PET/CT scan. Outpatient followup.   #8 Escherichia coli UTI Change IV Rocephin to oral cipro .   #9 prophylaxis PPI for GI prophylaxis. heparin for DVT prophylaxis.    Code Status: Full Family Communication: Updated patient no family present. Disposition Plan: Remain inpatient   Consultants:  General surgery: Dr. Johney Maine 03/05/2014   GYN Dr. Hulan Fray 03/06/14  Procedures:  Chest x-ray 03/03/2014  Status post 3 units packed red blood cells 6/19-6/20/2015  CT abdomen and pelvis 03/04/2014  Pelvic ultrasound 03/04/2014  MRI abdomen 03/06/2014  Endometrial biopsy : Dr Hulan Fray 03/06/14  Antibiotics:  IV Rocephin 03/06/2014>>>>03/07/14  Oral cipro 03/07/14  HPI/Subjective: Patient states she's feeling much better. Patient denies any chest pain. No shortness of breath. Patient states had some vaginal spotting. Patient states significant improvement in vaginal bleeding.  Objective: Filed Vitals:   03/07/14 0529  BP: 112/55  Pulse: 69  Temp: 98.2 F (36.8 C)  Resp: 18    Intake/Output Summary (Last 24 hours) at 03/07/14 0936 Last data filed at 03/06/14 2258  Gross per 24 hour  Intake  797.5 ml  Output      0 ml  Net  797.5 ml   Filed Weights   03/05/14 0615 03/06/14 0523 03/07/14 0529  Weight: 133.358 kg (294 lb) 133.162 kg (293 lb 9.1 oz) 132.995 kg (293 lb 3.2 oz)    Exam:   General:  NAD  Cardiovascular: RRR  Respiratory: Clear to  auscultation bilaterally. No wheezes, no crackles, no rhonchi.  Abdomen: Obese, soft, nontender, nondistended, positive bowel sounds.  Musculoskeletal: No clubbing cyanosis or edema  Data Reviewed: Basic Metabolic  Panel:  Recent Labs Lab 03/03/14 1510 03/04/14 0915 03/05/14 0353 03/06/14 0346  NA 135* 138 140 141  K 3.3* 3.6* 3.6* 3.8  CL 97 100 103 106  CO2 25 24 26 23   GLUCOSE 105* 110* 95 94  BUN 7 7 7 9   CREATININE 0.76 0.87 0.95 0.91  CALCIUM 9.2 8.9 9.0 8.7   Liver Function Tests:  Recent Labs Lab 03/03/14 1831  AST 37  ALT 30  ALKPHOS 38*  BILITOT 0.2*  PROT 6.8  ALBUMIN 2.9*   No results found for this basename: LIPASE, AMYLASE,  in the last 168 hours No results found for this basename: AMMONIA,  in the last 168 hours CBC:  Recent Labs Lab 03/04/14 0313  03/05/14 0353 03/05/14 1504 03/06/14 0346 03/06/14 2155 03/07/14 0400  WBC 11.0*  < > 9.7 9.1 9.8 11.3* 10.3  NEUTROABS 6.9  --   --   --   --   --   --   HGB 7.3*  < > 8.2* 8.6* 8.2* 8.8* 8.2*  HCT 24.3*  < > 26.3* 27.6* 27.0* 28.6* 27.0*  MCV 95.3  < > 92.6 93.2 94.4 94.4 94.7  PLT 330  < > 331 318 273 308 304  < > = values in this interval not displayed. Cardiac Enzymes: No results found for this basename: CKTOTAL, CKMB, CKMBINDEX, TROPONINI,  in the last 168 hours BNP (last 3 results) No results found for this basename: PROBNP,  in the last 8760 hours CBG: No results found for this basename: GLUCAP,  in the last 168 hours  Recent Results (from the past 240 hour(s))  URINE CULTURE     Status: None   Collection Time    03/04/14 12:26 PM      Result Value Ref Range Status   Specimen Description URINE, CLEAN CATCH   Final   Special Requests NONE   Final   Culture  Setup Time     Final   Value: 03/04/2014 19:01     Performed at [facility]     Final   Value: >=100,000 COLONIES/ML     Performed at [facility]   Culture     Final   Value: ESCHERICHIA COLI     Performed at [facility]   Report Status 03/06/2014 FINAL   Final   Organism ID, Bacteria ESCHERICHIA COLI   Final     Studies: Mr Abdomen W Wo Contrast  03/06/2014   CLINICAL DATA:  Weakness.  Anemia. History of breast cancer. Evaluate renal cyst.  EXAM: MRI ABDOMEN WITHOUT AND WITH CONTRAST  TECHNIQUE: Multiplanar multisequence MR imaging of the abdomen was performed both before and after the administration of intravenous contrast.  CONTRAST:  101mL MULTIHANCE GADOBENATE DIMEGLUMINE 529 MG/ML IV SOLN  COMPARISON:  CT of the abdomen and pelvis 03/04/2014.  FINDINGS: There are several lesions in the kidneys bilaterally which are low signal intensity on T1 weighted images, high signal intensity on T2 weighted images, and do not enhance, compatible with simple cysts. The largest simple cyst measures 2.9 cm in diameter in the medial aspect of the interpolar region of the left kidney. The largest renal lesion overall is in the interpolar region of the right kidney measuring 4.4 x 3.7 cm. This lesion is also  generally low T1 signal intensity and high T2 signal intensity, without internal enhancement. However, there is some very subtle nodularity along the anterior margin of the lesion best appreciated on image 37 of series 8, which corresponds to some minimal enhancement on post gadolinium images, best appreciated on image 59 of series 1102 (this area of potential mural nodularity is favored to be normal adjacent renal parenchyma rather than a true mural nodule). This lesion also has multiple thin internal nonenhancing septations, which are best appreciated on coronal T2 weighted images (image 29 of series 9). Diffusion-weighted sequences demonstrate some diffusion restriction (lesion is dark on ADC map).  In addition, there is some amorphous soft tissue located beneath segment 1 of the liver, posterior to the portal vein, anterior to the IVC and upper pole of the right kidney, posterior the second portion of the duodenum and immediately above and posterior to the head and uncinate process of the pancreas. This amorphous soft tissue is intimately associated with all adjacent structures, and is T2 hyperintense on  respiratory triggered fat sat T2 weighted images (best appreciated on images 24-30 of series 4), hypointense spine T1 weighted images compared to normal pancreatic and hepatic parenchyma, demonstrates diffusion restriction (images 18 of series 3 and 21 of series 300), and demonstrates slightly differential enhancement when compared to either the hepatic or pancreatic parenchyma (best appreciated on subtraction image 37 of series 11102).  There are 2 tiny sub cm lesions in the liver which are low T1 signal intensity, high T2 signal intensity, and do not enhance, compatible with tiny simple cysts or biliary hamartomas. Status post cholecystectomy. The appearance of the spleen and bilateral adrenal glands is unremarkable.  IMPRESSION: 1. The lesion of concern in the lateral aspect of the interpolar region of the right kidney has indeterminate imaging characteristics, but is favored to be benign, and is currently classified as a Bosniak class IIF lesion. Repeat MRI with and without IV gadolinium in 6 months is recommended to ensure stability. This recommendation follows ACR consensus guidelines: Managing Incidental Findings on Abdominal CT: White Paper of the ACR Incidental Findings Committee. J Am Coll Radiol 2010;7:754-773. 2. In addition, there is some amorphous soft tissue in the portacaval and hepatoduodenal ligament regions which has indeterminate but potentially malignant imaging characteristics (with diffusion restriction). This is of uncertain etiology and significance, but could represent a lymphoma, or potentially nodal metastatic disease. Correlation with PET-CT is recommended to assess for hypermetabolism. Should this lesion prove to be hypermetabolic on PET-CT, further evaluation with endoscopic ultrasound and biopsy would be suggested. 3. Multiple additional simple renal cysts. These results will be called to the ordering clinician or representative by the Radiologist Assistant, and communication  documented in the PACS or zVision Dashboard.   Electronically Signed   By: Vinnie Langton M.D.   On: 03/06/2014 08:57    Scheduled Meds: . cefTRIAXone (ROCEPHIN)  IV  1 g Intravenous Q24H  . ferrous sulfate  325 mg Oral TID WC  . metoprolol  50 mg Oral BID  . pantoprazole  40 mg Oral Q0600  . sodium chloride  3 mL Intravenous Q12H   Continuous Infusions: . heparin 1,650 Units/hr (03/07/14 0646)    Principal Problem:   Symptomatic anemia Active Problems:   Menorrhagia with irregular cycle   Acute blood loss anemia   Pulmonary embolism   Breast cancer   Essential hypertension, benign   History of BRBPR (bright red blood per rectum)   Colon cancer   Fibroids  E. coli UTI (urinary tract infection)    Time spent: 60 minutes    THOMPSON,DANIEL M.D. [facility] Pager [phone]. If 7PM-7AM, please contact night-coverage at [email or web address], password [facility] 03/07/2014, 9:36 AM  LOS: 4 days

## 2014-03-08 LAB — BASIC METABOLIC PANEL
BUN: 9 mg/dL (ref 6–23)
CHLORIDE: 106 meq/L (ref 96–112)
CO2: 22 mEq/L (ref 19–32)
CREATININE: 0.98 mg/dL (ref 0.50–1.10)
Calcium: 8.8 mg/dL (ref 8.4–10.5)
GFR calc non Af Amer: 63 mL/min — ABNORMAL LOW (ref >90)
GFR, EST AFRICAN AMERICAN: 73 mL/min — AB (ref >90)
Glucose, Bld: 91 mg/dL (ref 70–99)
POTASSIUM: 3.7 meq/L (ref 3.7–5.3)
SODIUM: 141 meq/L (ref 137–147)

## 2014-03-08 LAB — CBC
HCT: 27.6 % — ABNORMAL LOW (ref 36.0–46.0)
Hemoglobin: 8.2 g/dL — ABNORMAL LOW (ref 12.0–15.0)
MCH: 28.7 pg (ref 26.0–34.0)
MCHC: 29.7 g/dL — ABNORMAL LOW (ref 30.0–36.0)
MCV: 96.5 fL (ref 78.0–100.0)
PLATELETS: 313 10*3/uL (ref 150–400)
RBC: 2.86 MIL/uL — AB (ref 3.87–5.11)
RDW: 15.9 % — AB (ref 11.5–15.5)
WBC: 10.3 10*3/uL (ref 4.0–10.5)

## 2014-03-08 LAB — HEPARIN LEVEL (UNFRACTIONATED): HEPARIN UNFRACTIONATED: 0.64 [IU]/mL (ref 0.30–0.70)

## 2014-03-08 MED ORDER — PEG 3350-KCL-NA BICARB-NACL 420 G PO SOLR
4000.0000 mL | Freq: Once | ORAL | Status: AC
  Administered 2014-03-08: 4000 mL via ORAL

## 2014-03-08 MED ORDER — SODIUM CHLORIDE 0.9 % IV SOLN
INTRAVENOUS | Status: DC
  Administered 2014-03-08: 18:00:00 via INTRAVENOUS

## 2014-03-08 MED ORDER — DEXTROSE 5 % IV SOLN
2.0000 g | INTRAVENOUS | Status: AC
  Administered 2014-03-09: 2 g via INTRAVENOUS
  Filled 2014-03-08: qty 2

## 2014-03-08 NOTE — Progress Notes (Signed)
Pt up to the bathroom several times during the shift, not vaginal bleeding noted. Will cont to monitor. SRP, RN

## 2014-03-08 NOTE — Progress Notes (Signed)
Observed patient take home Flagyl po @ 10a, 11a, and 2p.

## 2014-03-08 NOTE — Progress Notes (Signed)
[facility] PROGRESS NOTE  Veronica Reyes WJX:[id number] DOB: [birth year] DOA: 03/03/2014 PCP: [name]., FNP  Assessment/Plan: #1 symptomatic anemia/acute blood loss anemia Likely largely secondary to metromenorrhagia with Hemoccults positive stools without overt GI bleed in the setting of anticoagulation of xarelto. Patient stated has had irregular cycles with menses every 3-4 months. Prior to this the last menses was October 2014. Patient with vaginal bleeding with clots yesterday on admission with last prior episode 6 days prior to admission. Minimal vaginal bleeding. Admission hemoglobin was 5.8. Patient is status post 3 units packed red blood cells. Hemoglobin currently at 8.2. Heparin gtt started. Transfusion threshold hemoglobin less than 8.  Follow H&H.   #2 menorrhagia with irregular cycle Patient states has been having her menses every 3-4 months. Last was in October 2014 prior to this past month. Patient stated that had menses approximately 2 weeks prior to admission with heavy clots which subsided and had initially resolved 6 days prior to admission. Patient restarted  having vaginal bleeding while being transfused second unit of packed red blood cells 2 days ago. Patient with minimal vaginal bleeding. Pelvic ultrasound with fibroids and a thickened endometrium. Patient has been seen by Dr. Hulan Fray, GYN and underwent endometrial biopsy yesterday. Biopsy pending. Outpatient f/u with Dr Hulan Fray.   #3 hypertension Stable. Continue metoprolol.  #4 recent history of PE diagnosed 4/24 2015 Patient was on xarelto prior to admission which is on hold secondary to problems #1 and 2. Lower extremity Dopplers were negative for DVT. Patient with significant improvement in menorrhagia. Heparin gtt started. Monitor for bleeding.  #5 colon cancer Patient is being followed by general surgery. Heparin has been started.  Patient scheduled for surgery on Thursday 03/09/2014. CCS ff  .  #6 history of breast cancer status post radiation and surgery.  #7 bilateral renal cysts CT abdomen and pelvis with bilateral renal cysts with largest on the right kidney with questionable area of mural nodularity. MRI of abdomen with renal cysts. Will likely need repeat MRI in 6 months as well as probable PET/CT scan. Outpatient followup.   #8 Escherichia coli UTI Change IV Rocephin to oral cipro .   #9 prophylaxis PPI for GI prophylaxis. heparin for DVT prophylaxis.    Code Status: Full Family Communication: Updated patient no family present. Disposition Plan: Remain inpatient   Consultants:  General surgery: Dr. Johney Maine 03/05/2014   GYN Dr. Hulan Fray 03/06/14  Procedures:  Chest x-ray 03/03/2014  Status post 3 units packed red blood cells 6/19-6/20/2015  CT abdomen and pelvis 03/04/2014  Pelvic ultrasound 03/04/2014  MRI abdomen 03/06/2014  Endometrial biopsy : Dr Hulan Fray 03/06/14  Antibiotics:  IV Rocephin 03/06/2014>>>>03/07/14  Oral cipro 03/07/14  HPI/Subjective: Patient states she's feeling much better. Patient denies any chest pain. No shortness of breath. Patient states had some vaginal spotting. Patient states significant improvement in vaginal bleeding.  Objective: Filed Vitals:   03/08/14 1312  BP: 108/61  Pulse: 66  Temp: 98.5 F (36.9 C)  Resp: 18    Intake/Output Summary (Last 24 hours) at 03/08/14 1854 Last data filed at 03/08/14 1400  Gross per 24 hour  Intake 1734.5 ml  Output      0 ml  Net 1734.5 ml   Filed Weights   03/06/14 0523 03/07/14 0529 03/08/14 0544  Weight: 133.162 kg (293 lb 9.1 oz) 132.995 kg (293 lb 3.2 oz) 135.7 kg (299 lb 2.6 oz)    Exam:   General:  NAD  Cardiovascular: RRR  Respiratory: Clear to  auscultation bilaterally. No wheezes, no crackles, no rhonchi.  Abdomen: Obese, soft, nontender, nondistended, positive bowel sounds.  Musculoskeletal: No clubbing cyanosis or edema  Data Reviewed: Basic Metabolic  Panel:  Recent Labs Lab 03/03/14 1510 03/04/14 0915 03/05/14 0353 03/06/14 0346 03/08/14 0505  NA 135* 138 140 141 141  K 3.3* 3.6* 3.6* 3.8 3.7  CL 97 100 103 106 106  CO2 25 24 26 23 22   GLUCOSE 105* 110* 95 94 91  BUN 7 7 7 9 9   CREATININE 0.76 0.87 0.95 0.91 0.98  CALCIUM 9.2 8.9 9.0 8.7 8.8   Liver Function Tests:  Recent Labs Lab 03/03/14 1831  AST 37  ALT 30  ALKPHOS 38*  BILITOT 0.2*  PROT 6.8  ALBUMIN 2.9*   No results found for this basename: LIPASE, AMYLASE,  in the last 168 hours No results found for this basename: AMMONIA,  in the last 168 hours CBC:  Recent Labs Lab 03/04/14 0313  03/05/14 1504 03/06/14 0346 03/06/14 2155 03/07/14 0400 03/08/14 0505  WBC 11.0*  < > 9.1 9.8 11.3* 10.3 10.3  NEUTROABS 6.9  --   --   --   --   --   --   HGB 7.3*  < > 8.6* 8.2* 8.8* 8.2* 8.2*  HCT 24.3*  < > 27.6* 27.0* 28.6* 27.0* 27.6*  MCV 95.3  < > 93.2 94.4 94.4 94.7 96.5  PLT 330  < > 318 273 308 304 313  < > = values in this interval not displayed. Cardiac Enzymes: No results found for this basename: CKTOTAL, CKMB, CKMBINDEX, TROPONINI,  in the last 168 hours BNP (last 3 results) No results found for this basename: PROBNP,  in the last 8760 hours CBG: No results found for this basename: GLUCAP,  in the last 168 hours  Recent Results (from the past 240 hour(s))  URINE CULTURE     Status: None   Collection Time    03/04/14 12:26 PM      Result Value Ref Range Status   Specimen Description URINE, CLEAN CATCH   Final   Special Requests NONE   Final   Culture  Setup Time     Final   Value: 03/04/2014 19:01     Performed at [place]     Final   Value: >=100,000 COLONIES/ML     Performed at [facility]   Culture     Final   Value: ESCHERICHIA COLI     Performed at [facility]   Report Status 03/06/2014 FINAL   Final   Organism ID, Bacteria ESCHERICHIA COLI   Final     Studies: No results  found.  Scheduled Meds: . [START ON 03/09/2014] cefoTEtan (CEFOTAN) IV  2 g Intravenous On Call to OR  . ciprofloxacin  500 mg Oral BID  . ferrous sulfate  325 mg Oral TID WC  . metoprolol  50 mg Oral BID  . pantoprazole  40 mg Oral Q0600  . sodium chloride  3 mL Intravenous Q12H   Continuous Infusions: . sodium chloride 50 mL/hr at 03/08/14 1821  . heparin 1,650 Units/hr (03/08/14 1822)    Principal Problem:   Symptomatic anemia Active Problems:   Pulmonary embolism   Breast cancer   Essential hypertension, benign   History of BRBPR (bright red blood per rectum)   Colon cancer   Menorrhagia with irregular cycle   Acute blood loss anemia   Fibroids  E. coli UTI (urinary tract infection)    Time spent: 20 minutes    Veronica Reyes M.D. [facility] Pager [phone] If 7PM-7AM, please contact night-coverage at [email or web address], password [facility] 03/08/2014, 6:54 PM  LOS: 5 days

## 2014-03-08 NOTE — Progress Notes (Signed)
ANTICOAGULATION CONSULT NOTE - Initial Consult  Pharmacy Consult for Heparin Indication: Hx PE  No Known Allergies  Patient Measurements: Height: 5\' 6"  (167.6 cm) Weight: 299 lb 2.6 oz (135.7 kg) IBW/kg (Calculated) : 59.3 Heparin Dosing Weight: 89kg  Vital Signs: Temp: 98.3 F (36.8 C) (06/24 0544) Temp src: Oral (06/24 0544) BP: 136/70 mmHg (06/24 0544) Pulse Rate: 74 (06/24 0544)  Labs:  Recent Labs  03/06/14 0346 03/06/14 2155 03/07/14 0400 03/07/14 0525 03/07/14 1230 03/08/14 0505  HGB 8.2* 8.8* 8.2*  --   --  8.2*  HCT 27.0* 28.6* 27.0*  --   --  27.6*  PLT 273 308 304  --   --  313  HEPARINUNFRC  --   --   --  0.26* 0.36 0.64  CREATININE 0.91  --   --   --   --  0.98    Estimated Creatinine Clearance: 89.9 ml/min (by C-G formula based on Cr of 0.98).   Medical History: Past Medical History  Diagnosis Date  . Hypertension   . Cancer     left breast cancer x2  . Breast CA     radiation and surgery  . Shortness of breath     sob with exertion. dx. with Pulmonary emboli 4'15 ,tx. Xarelto,Lovenox used for [employer].  . Pulmonary embolism     "blood clot in lungs" -dx. 4'15 with CT Chest.    Medications:  Scheduled:  . ciprofloxacin  500 mg Oral BID  . ferrous sulfate  325 mg Oral TID WC  . metoprolol  50 mg Oral BID  . pantoprazole  40 mg Oral Q0600  . polyethylene glycol-electrolytes  4,000 mL Oral Once  . sodium chloride  3 mL Intravenous Q12H   Infusions:  . sodium chloride 50 mL/hr at 03/08/14 0657  . heparin 1,650 Units/hr (03/07/14 2132)   PRN: acetaminophen, acetaminophen, ondansetron (ZOFRAN) IV, ondansetron, zolpidem  Assessment:  81 yof h/o PE in April 2015 and breast cancer.  Admitted with rectal and vaginal bleeding (Hgb 5.8) on xarelto 15 mg BID prior to admission.  Patients last dose was 6/19. Xarelto was held on admission secondary to vaginal bleeding. Pharmacy asked to continue to hold xarelto and restart IV heparin gtt in  anticipation of surgery on Thursday.   CBC stable/okay today   Renal function is WNL, CrCl 95 ml/min  No further bleeding noted by RN  6/23 0525 heparin level = 0.26 SUBtherapeutic: Infusion increased to 1650 units/hr 6/23 1230 heparin level = 0.36 Therapeutic 5/24: 0500 heparin level = 0.64 Therapeutic  Goal of Therapy:  Heparin level 0.3-0.7 units/ml Monitor platelets by anticoagulation protocol: Yes   Plan:  Continue Heparin infusion at 1650 units/hr Follow daily heparin levels & CBC  Julio Sicks PharmD Pager #: [phone] 7:57 AM 03/08/2014

## 2014-03-09 ENCOUNTER — Encounter (HOSPITAL_COMMUNITY): Payer: BC Managed Care – PPO | Admitting: *Deleted

## 2014-03-09 ENCOUNTER — Encounter (HOSPITAL_COMMUNITY)
Admission: EM | Disposition: A | Discharge status: Home Health | Payer: Self-pay | Source: Home / Self Care | Attending: General Surgery

## 2014-03-09 ENCOUNTER — Encounter (HOSPITAL_COMMUNITY): Payer: Self-pay | Admitting: *Deleted

## 2014-03-09 ENCOUNTER — Inpatient Hospital Stay (HOSPITAL_COMMUNITY): Payer: BC Managed Care – PPO | Admitting: *Deleted

## 2014-03-09 ENCOUNTER — Ambulatory Visit (HOSPITAL_COMMUNITY): Admission: RE | Admit: 2014-03-09 | Payer: BC Managed Care – PPO | Source: Ambulatory Visit | Admitting: General Surgery

## 2014-03-09 DIAGNOSIS — C189 Malignant neoplasm of colon, unspecified: Secondary | ICD-10-CM

## 2014-03-09 HISTORY — PX: COLON SURGERY: SHX602

## 2014-03-09 HISTORY — PX: PARTIAL COLECTOMY: SHX5273

## 2014-03-09 LAB — CBC
HCT: 27.7 % — ABNORMAL LOW (ref 36.0–46.0)
HCT: 33.1 % — ABNORMAL LOW (ref 36.0–46.0)
HEMOGLOBIN: 10.1 g/dL — AB (ref 12.0–15.0)
Hemoglobin: 8.3 g/dL — ABNORMAL LOW (ref 12.0–15.0)
MCH: 28.9 pg (ref 26.0–34.0)
MCH: 29.1 pg (ref 26.0–34.0)
MCHC: 30 g/dL (ref 30.0–36.0)
MCHC: 30.5 g/dL (ref 30.0–36.0)
MCV: 96.5 fL (ref 78.0–100.0)
MCV: 95.4 fL (ref 78.0–100.0)
PLATELETS: 299 10*3/uL (ref 150–400)
Platelets: 271 10*3/uL (ref 150–400)
RBC: 2.87 MIL/uL — AB (ref 3.87–5.11)
RBC: 3.47 MIL/uL — ABNORMAL LOW (ref 3.87–5.11)
RDW: 16.3 % — ABNORMAL HIGH (ref 11.5–15.5)
RDW: 16.4 % — ABNORMAL HIGH (ref 11.5–15.5)
WBC: 9.2 10*3/uL (ref 4.0–10.5)
WBC: 13.6 10*3/uL — ABNORMAL HIGH (ref 4.0–10.5)

## 2014-03-09 LAB — SURGICAL PCR SCREEN
MRSA, PCR: NEGATIVE
Staphylococcus aureus: NEGATIVE

## 2014-03-09 LAB — HEPARIN LEVEL (UNFRACTIONATED)

## 2014-03-09 LAB — PREPARE RBC (CROSSMATCH)

## 2014-03-09 SURGERY — COLECTOMY, PARTIAL
Anesthesia: General | Site: Abdomen

## 2014-03-09 MED ORDER — LACTATED RINGERS IR SOLN
Status: DC | PRN
  Administered 2014-03-09: 3000 mL

## 2014-03-09 MED ORDER — METHYLENE BLUE 1 % INJ SOLN
INTRAMUSCULAR | Status: AC
Start: 2014-03-09 — End: 2014-03-09
  Filled 2014-03-09: qty 10

## 2014-03-09 MED ORDER — ACETAMINOPHEN 500 MG PO TABS
1000.0000 mg | ORAL_TABLET | Freq: Four times a day (QID) | ORAL | Status: DC | PRN
  Administered 2014-03-10 – 2014-03-11 (×3): 1000 mg via ORAL
  Filled 2014-03-09 (×3): qty 2

## 2014-03-09 MED ORDER — MIDAZOLAM HCL 5 MG/5ML IJ SOLN
INTRAMUSCULAR | Status: DC | PRN
  Administered 2014-03-09: 2 mg via INTRAVENOUS

## 2014-03-09 MED ORDER — SODIUM CHLORIDE 0.9 % IV SOLN
2.0000 g | INTRAVENOUS | Status: DC | PRN
  Administered 2014-03-09: 2 g via INTRAVENOUS

## 2014-03-09 MED ORDER — DEXTROSE 5 % IV SOLN
2.0000 g | Freq: Two times a day (BID) | INTRAVENOUS | Status: AC
  Administered 2014-03-09: 2 g via INTRAVENOUS
  Filled 2014-03-09: qty 2

## 2014-03-09 MED ORDER — SUFENTANIL CITRATE 50 MCG/ML IV SOLN
INTRAVENOUS | Status: DC | PRN
  Administered 2014-03-09: 10 ug via INTRAVENOUS
  Administered 2014-03-09 (×2): 5 ug via INTRAVENOUS
  Administered 2014-03-09: 10 ug via INTRAVENOUS
  Administered 2014-03-09: 5 ug via INTRAVENOUS
  Administered 2014-03-09: 10 ug via INTRAVENOUS
  Administered 2014-03-09 (×4): 5 ug via INTRAVENOUS
  Administered 2014-03-09: 10 ug via INTRAVENOUS
  Administered 2014-03-09: 5 ug via INTRAVENOUS

## 2014-03-09 MED ORDER — NEOSTIGMINE METHYLSULFATE 10 MG/10ML IV SOLN
INTRAVENOUS | Status: DC | PRN
  Administered 2014-03-09: 5 mg via INTRAVENOUS

## 2014-03-09 MED ORDER — BUPIVACAINE-EPINEPHRINE 0.25% -1:200000 IJ SOLN
INTRAMUSCULAR | Status: DC | PRN
  Administered 2014-03-09: 23 mL

## 2014-03-09 MED ORDER — NALOXONE HCL 0.4 MG/ML IJ SOLN: 0.4000 mg | INTRAMUSCULAR | Status: DC | PRN

## 2014-03-09 MED ORDER — CISATRACURIUM BESYLATE (PF) 10 MG/5ML IV SOLN
INTRAVENOUS | Status: DC | PRN
  Administered 2014-03-09: 2 mg via INTRAVENOUS
  Administered 2014-03-09: 5 mg via INTRAVENOUS
  Administered 2014-03-09: 4 mg via INTRAVENOUS

## 2014-03-09 MED ORDER — ACETAMINOPHEN 500 MG PO TABS
1000.0000 mg | ORAL_TABLET | Freq: Four times a day (QID) | ORAL | Status: DC
  Administered 2014-03-09: 1000 mg via ORAL
  Filled 2014-03-09: qty 2

## 2014-03-09 MED ORDER — VITAMINS A & D EX OINT
TOPICAL_OINTMENT | CUTANEOUS | Status: AC
  Administered 2014-03-09: 21:00:00
  Filled 2014-03-09: qty 5

## 2014-03-09 MED ORDER — DIPHENHYDRAMINE HCL 50 MG/ML IJ SOLN: 12.5000 mg | Freq: Four times a day (QID) | INTRAMUSCULAR | Status: DC | PRN

## 2014-03-09 MED ORDER — ENOXAPARIN SODIUM 40 MG/0.4ML ~~LOC~~ SOLN
40.0000 mg | SUBCUTANEOUS | Status: DC
  Administered 2014-03-10 – 2014-03-12 (×3): 40 mg via SUBCUTANEOUS
  Filled 2014-03-09 (×4): qty 0.4

## 2014-03-09 MED ORDER — METHYLENE BLUE 1 % INJ SOLN
INTRAMUSCULAR | Status: DC | PRN
  Administered 2014-03-09: 5 mL via INTRAVENOUS

## 2014-03-09 MED ORDER — BUPIVACAINE-EPINEPHRINE (PF) 0.25% -1:200000 IJ SOLN
INTRAMUSCULAR | Status: AC
  Filled 2014-03-09: qty 30

## 2014-03-09 MED ORDER — STERILE WATER FOR IRRIGATION IR SOLN
Status: DC | PRN
  Administered 2014-03-09: 1500 mL

## 2014-03-09 MED ORDER — METOCLOPRAMIDE HCL 5 MG/ML IJ SOLN
INTRAMUSCULAR | Status: DC | PRN
  Administered 2014-03-09: 10 mg via INTRAVENOUS

## 2014-03-09 MED ORDER — PROPOFOL 10 MG/ML IV BOLUS
INTRAVENOUS | Status: DC | PRN
  Administered 2014-03-09: 200 mg via INTRAVENOUS

## 2014-03-09 MED ORDER — HYDROMORPHONE HCL PF 1 MG/ML IJ SOLN
INTRAMUSCULAR | Status: DC | PRN
  Administered 2014-03-09 (×4): 0.5 mg via INTRAVENOUS

## 2014-03-09 MED ORDER — SODIUM CHLORIDE 0.9 % IJ SOLN: 9.0000 mL | INTRAMUSCULAR | Status: DC | PRN

## 2014-03-09 MED ORDER — DIPHENHYDRAMINE HCL 12.5 MG/5ML PO ELIX: 12.5000 mg | ORAL_SOLUTION | Freq: Four times a day (QID) | ORAL | Status: DC | PRN

## 2014-03-09 MED ORDER — ONDANSETRON HCL 4 MG/2ML IJ SOLN
INTRAMUSCULAR | Status: DC | PRN
  Administered 2014-03-09: 4 mg via INTRAVENOUS

## 2014-03-09 MED ORDER — LACTATED RINGERS IV SOLN
INTRAVENOUS | Status: DC | PRN
  Administered 2014-03-09: 07:00:00 via INTRAVENOUS

## 2014-03-09 MED ORDER — SUCCINYLCHOLINE CHLORIDE 20 MG/ML IJ SOLN
INTRAMUSCULAR | Status: DC | PRN
  Administered 2014-03-09: 120 mg via INTRAVENOUS

## 2014-03-09 MED ORDER — ONDANSETRON HCL 4 MG/2ML IJ SOLN
4.0000 mg | Freq: Four times a day (QID) | INTRAMUSCULAR | Status: DC | PRN
Start: 2014-03-09 — End: 2014-03-13

## 2014-03-09 MED ORDER — SODIUM CHLORIDE 0.9 % IV SOLN
INTRAVENOUS | Status: DC | PRN
  Administered 2014-03-09: 09:00:00 via INTRAVENOUS

## 2014-03-09 MED ORDER — LACTATED RINGERS IV SOLN
INTRAVENOUS | Status: DC | PRN
  Administered 2014-03-09 (×3): via INTRAVENOUS

## 2014-03-09 MED ORDER — GLYCOPYRROLATE 0.2 MG/ML IJ SOLN
INTRAMUSCULAR | Status: DC | PRN
  Administered 2014-03-09: .8 mg via INTRAVENOUS

## 2014-03-09 MED ORDER — 0.9 % SODIUM CHLORIDE (POUR BTL) OPTIME
TOPICAL | Status: DC | PRN
  Administered 2014-03-09: 2000 mL

## 2014-03-09 MED ORDER — MORPHINE SULFATE (PF) 1 MG/ML IV SOLN
INTRAVENOUS | Status: DC
  Administered 2014-03-09: 13:00:00 via INTRAVENOUS
  Administered 2014-03-09: 12 mg via INTRAVENOUS
  Administered 2014-03-10: 3 mg via INTRAVENOUS
  Administered 2014-03-10 (×2): 4.5 mg via INTRAVENOUS
  Administered 2014-03-10: 6.51 mg via INTRAVENOUS
  Administered 2014-03-10: 07:00:00 via INTRAVENOUS
  Administered 2014-03-10 (×2): 3 mg via INTRAVENOUS
  Administered 2014-03-11 (×2): 1.5 mg via INTRAVENOUS
  Administered 2014-03-11: 4.5 mg via INTRAVENOUS
  Administered 2014-03-11: 1.5 mg via INTRAVENOUS
  Administered 2014-03-11 (×2): 3 mg via INTRAVENOUS
  Administered 2014-03-12: 1 mg via INTRAVENOUS
  Administered 2014-03-12 (×4): 1.5 mg via INTRAVENOUS
  Administered 2014-03-12: 2.39 mg via INTRAVENOUS
  Administered 2014-03-12: 1.5 mg via INTRAVENOUS
  Administered 2014-03-13 (×2): 3 mg via INTRAVENOUS
  Filled 2014-03-09 (×3): qty 25

## 2014-03-09 MED ORDER — LIDOCAINE HCL (CARDIAC) 20 MG/ML IV SOLN
INTRAVENOUS | Status: DC | PRN
  Administered 2014-03-09: 50 mg via INTRAVENOUS

## 2014-03-09 SURGICAL SUPPLY — 55 items
ADH SKN CLS APL DERMABOND .7 (GAUZE/BANDAGES/DRESSINGS) ×1
BLADE EXTENDED COATED 6.5IN (ELECTRODE) ×3 IMPLANT
BLADE HEX COATED 2.75 (ELECTRODE) ×6 IMPLANT
CANISTER SUCTION 2500CC (MISCELLANEOUS) ×3 IMPLANT
CELLS DAT CNTRL 66122 CELL SVR (MISCELLANEOUS) ×1 IMPLANT
CHLORAPREP W/TINT 26ML (MISCELLANEOUS) ×5 IMPLANT
COVER MAYO STAND STRL (DRAPES) ×6 IMPLANT
DERMABOND ADVANCED (GAUZE/BANDAGES/DRESSINGS) ×2
DERMABOND ADVANCED .7 DNX12 (GAUZE/BANDAGES/DRESSINGS) IMPLANT
DRAPE LAPAROSCOPIC ABDOMINAL (DRAPES) ×3 IMPLANT
DRAPE LG THREE QUARTER DISP (DRAPES) ×4 IMPLANT
DRAPE WARM FLUID 44X44 (DRAPE) ×3 IMPLANT
DRSG OPSITE POSTOP 4X6 (GAUZE/BANDAGES/DRESSINGS) ×2 IMPLANT
DRSG OPSITE POSTOP 4X8 (GAUZE/BANDAGES/DRESSINGS) ×2 IMPLANT
ELECT REM PT RETURN 9FT ADLT (ELECTROSURGICAL) ×3
ELECTRODE REM PT RTRN 9FT ADLT (ELECTROSURGICAL) ×1 IMPLANT
GAUZE SPONGE 4X4 12PLY STRL (GAUZE/BANDAGES/DRESSINGS) ×3 IMPLANT
GLOVE BIOGEL PI IND STRL 7.0 (GLOVE) ×2 IMPLANT
GLOVE BIOGEL PI INDICATOR 7.0 (GLOVE) ×4
GOWN SPEC L4 XLG W/TWL (GOWN DISPOSABLE) ×12 IMPLANT
KIT BASIN OR (CUSTOM PROCEDURE TRAY) ×5 IMPLANT
LEGGING LITHOTOMY PAIR STRL (DRAPES) ×2 IMPLANT
LIGASURE IMPACT 36 18CM CVD LR (INSTRUMENTS) IMPLANT
NS IRRIG 1000ML POUR BTL (IV SOLUTION) ×2 IMPLANT
PACK GENERAL/GYN (CUSTOM PROCEDURE TRAY) ×3 IMPLANT
RETRACTOR WND ALEXIS 18 MED (MISCELLANEOUS) IMPLANT
RTRCTR WOUND ALEXIS 18CM MED (MISCELLANEOUS) ×3
SEALER TISSUE G2 STRG ARTC 35C (ENDOMECHANICALS) ×2 IMPLANT
SET TUBE IRRIG SUCTION NO TIP (IRRIGATION / IRRIGATOR) ×2 IMPLANT
SLEEVE XCEL OPT CAN 5 100 (ENDOMECHANICALS) ×4 IMPLANT
SPONGE LAP 18X18 X RAY DECT (DISPOSABLE) ×2 IMPLANT
STAPLER CUT CVD 40MM BLUE (STAPLE) ×2 IMPLANT
STAPLER CUT RELOAD BLUE (STAPLE) ×2 IMPLANT
STAPLER VISISTAT 35W (STAPLE) ×1 IMPLANT
SUCTION POOLE TIP (SUCTIONS) ×3 IMPLANT
SUT PDS AB 1 CTX 36 (SUTURE) IMPLANT
SUT PDS AB 1 TP1 96 (SUTURE) IMPLANT
SUT PROLENE 2 0 BLUE (SUTURE) IMPLANT
SUT PROLENE 2 0 KS (SUTURE) ×2 IMPLANT
SUT SILK 2 0 (SUTURE) ×6
SUT SILK 2 0 SH CR/8 (SUTURE) ×4 IMPLANT
SUT SILK 2 0SH CR/8 30 (SUTURE) IMPLANT
SUT SILK 2-0 18XBRD TIE 12 (SUTURE) ×2 IMPLANT
SUT SILK 2-0 30XBRD TIE 12 (SUTURE) IMPLANT
SUT SILK 3 0 (SUTURE) ×3
SUT SILK 3 0 SH CR/8 (SUTURE) ×4 IMPLANT
SUT SILK 3-0 18XBRD TIE 12 (SUTURE) ×2 IMPLANT
SUT VIC AB 4-0 PS2 27 (SUTURE) ×2 IMPLANT
SUT VICRYL 0 UR6 27IN ABS (SUTURE) ×2 IMPLANT
TOWEL OR 17X26 10 PK STRL BLUE (TOWEL DISPOSABLE) ×6 IMPLANT
TOWEL OR NON WOVEN STRL DISP B (DISPOSABLE) ×4 IMPLANT
TRAY FOLEY CATH 14FRSI W/METER (CATHETERS) ×3 IMPLANT
TROCAR BLADELESS OPT 12M 100M (ENDOMECHANICALS) ×2 IMPLANT
TROCAR BLADELESS OPT 5 100 (ENDOMECHANICALS) ×2 IMPLANT
TUBING INSUFFLATION 10FT LAP (TUBING) ×2 IMPLANT

## 2014-03-09 NOTE — Progress Notes (Signed)
CBC drawn by lab. 

## 2014-03-09 NOTE — Progress Notes (Signed)
Lab still running CBC

## 2014-03-09 NOTE — Progress Notes (Signed)
Small amount of vaginal bleeding noted on peripads.

## 2014-03-09 NOTE — Progress Notes (Signed)
Lattie Haw, R.N. - Stepdown Unit to floow up calling CBC results to Dr. Marcello Moores.

## 2014-03-09 NOTE — Anesthesia Preprocedure Evaluation (Addendum)
Anesthesia Evaluation  Patient identified by MRN, date of birth, ID band Patient awake    Reviewed: Allergy & Precautions, H&P , NPO status , Patient's Chart, lab work & pertinent test results, reviewed documented beta blocker date and time   Airway Mallampati: II TM Distance: >3 FB Neck ROM: Full    Dental  (+) Dental Advisory Given   Pulmonary shortness of breath,  breath sounds clear to auscultation        Cardiovascular hypertension, Pt. on medications and Pt. on home beta blockers negative cardio ROS  Rhythm:Regular Rate:Normal     Neuro/Psych negative neurological ROS  negative psych ROS   GI/Hepatic negative GI ROS, Neg liver ROS,   Endo/Other  Morbid obesity  Renal/GU negative Renal ROS     Musculoskeletal negative musculoskeletal ROS (+)   Abdominal   Peds  Hematology  (+) anemia ,   Anesthesia Other Findings   Reproductive/Obstetrics negative OB ROS                        Anesthesia Physical Anesthesia Plan  ASA: IV  Anesthesia Plan: General   Post-op Pain Management:    Induction: Intravenous  Airway Management Planned: Oral ETT  Additional Equipment:   Intra-op Plan:   Post-operative Plan: Extubation in OR and Possible Post-op intubation/ventilation  Informed Consent: I have reviewed the patients History and Physical, chart, labs and discussed the procedure including the risks, benefits and alternatives for the proposed anesthesia with the patient or authorized representative who has indicated his/her understanding and acceptance.   Dental advisory given  Plan Discussed with: CRNA  Anesthesia Plan Comments:       Anesthesia Quick Evaluation

## 2014-03-09 NOTE — Transfer of Care (Signed)
Immediate Anesthesia Transfer of Care Note  Patient: Industrial/product designer  Procedure(s) Performed: Procedure(s): LAPAROSCOPIC ASSISTED PARTIAL COLECTOMY, splenic flexure mobilization (N/A)  Patient Location: PACU  Anesthesia Type:General  Level of Consciousness: awake, oriented and patient cooperative  Airway & Oxygen Therapy: Patient Spontanous Breathing and Patient connected to face mask oxygen  Post-op Assessment: Report given to PACU RN, Post -op Vital signs reviewed and stable and Patient moving all extremities  Post vital signs: Reviewed and stable  Complications: No apparent anesthesia complications

## 2014-03-09 NOTE — Op Note (Signed)
03/03/2014 - 03/09/2014  12:23 PM  PATIENT:  Veronica Reyes  58 y.o. female  Patient Care Team: Claris Gladden. Ouida Sills, [place] as PCP - General (Nurse Practitioner) Leighton Ruff, MD as Consulting Physician (General Surgery) Beryle Beams, MD as Consulting Physician (Gastroenterology)  PRE-OPERATIVE DIAGNOSIS:  COLON CANCER  POST-OPERATIVE DIAGNOSIS:  COLON CANCER  PROCEDURE:  LAPAROSCOPIC ASSISTED PARTIAL COLECTOMY, splenic flexure mobilization  SURGEON:  Surgeon(s): Leighton Ruff, MD Adin Hector, MD  ASSISTANT: Johney Maine   ANESTHESIA:   local and general  EBL: 275ml Total I/O In: 9371 [I.V.:2400; Blood:355] Out: 38 [Urine:135; Blood:200]  Delay start of Pharmacological VTE agent (>24hrs) due to surgical blood loss or risk of bleeding:  no  DRAINS: none   SPECIMEN:  Source of Specimen:  Sigmoid colon, proximal doughnut is final margin  DISPOSITION OF SPECIMEN:  PATHOLOGY  COUNTS:  YES  PLAN OF CARE: Admit to inpatient   PATIENT DISPOSITION:  PACU - hemodynamically stable.  INDICATION:     This is a 58 y.o. F who was found to have a colon cancer of colonoscopy.  I recommended segmental resection:  The anatomy & physiology of the digestive tract was discussed.  The pathophysiology was discussed.  Natural history risks without surgery was discussed.   I worked to give an overview of the disease and the frequent need to have multispecialty involvement.  I feel the risks of no intervention will lead to serious problems that outweigh the operative risks; therefore, I recommended a partial colectomy to remove the pathology.  Laparoscopic & open techniques were discussed.   Risks such as bleeding, infection, abscess, leak, reoperation, possible ostomy, hernia, heart attack, death, and other risks were discussed.  I noted a good likelihood this will help address the problem.   Goals of post-operative recovery were discussed as well.    The patient expressed understanding  & wished to proceed with surgery.  OR FINDINGS:   Patient had mass in proximal sigmoid colon  No obvious metastatic disease on visceral parietal peritoneum or liver.   DESCRIPTION:   Informed consent was confirmed.  The patient underwent general anaesthesia without difficulty.  The patient was positioned appropriately.  VTE prevention in place.  The patient's abdomen was clipped, prepped, & draped in a sterile fashion.  Surgical timeout confirmed our plan.  The patient was positioned in reverse Trendelenburg.  Abdominal entry was gained using open technique.  Entry was clean.  I induced carbon dioxide insufflation.  Camera inspection revealed no injury.  Extra ports were carefully placed under direct laparoscopic visualization.  There was no sign of metastatic disease.     I reflected the greater omentum and the upper abdomen the small bowel in the upper abdomen. I scored the base of peritoneum of the right side of the mesentery of the left colon from the ligament of Treitz to the peritoneal reflection of the mid rectum.    I elevated the sigmoid mesentery and enetered into the retro-mesenteric plane. We were able to identify the left ureter and gonadal vessels. We kept those posterior within the retroperitoneum and elevated the left colon mesentery off that. I did isolated IMA pedicle but did not ligate it yet.  I continued distally and got into the avascular plane posterior to the mesorectum. This allowed me to help mobilize the rectum as well by freeing the mesorectum off the sacrum.  I mobilized the peritoneal coverings towards the peritoneal reflection on both the right and left sides of the rectum.  I could see the right and left ureters and stayed away from them.    I skeletonized the inferior mesenteric artery pedicle.  I went down to its takeoff from the aorta.   I isolated the inferior mesenteric vein off of the ligament of Treitz just cephalad to that as well.  After confirming the left  ureter was out of the way, I went ahead and ligated the inferior mesenteric artery pedicle with bipolar EnSeal ~2cm above its takeoff from the aorta.   I did ligate the inferior mesenteric vein in a similar fashion.  We ensured hemostasis. I skeletonized the mesorectum at the junction at the proximal rectum using blunt dissection & bipolar EnSeal.  I mobilized the left colon in a lateral to medial fashion off the line of Toldt up towards the splenic flexure to ensure good mobilization of the left colon to reach into the pelvis.  I took down the splenic flexure using the Enseal device.  The omentum was mobilized away.  Once both layers of the splenic flexure were mobilized.  I continued to mobilize off the retroperitoneum.  There were many dense adhesions in this area.  Hemostasis was controlled using Enseal.  Once the entire colon was mobilized, a Pfannenstiel incision was made in standard fashion.  A wound protector was placed.  The colon was removed from the abdomen.  The proximal end was transected using a contour stapler.  The mesentery was divided using the Enseal.  There was a tubular structure that was mobilized.  I did not feel comfortable transecting this and decided to leave this in the abdomen.  I dissected above this structure and came down to the rectosigmoid junction.  The mesentery was thinned and the colon was stapled using the Contour stapler.  The specimen was sent to pathology for further inspection.  The proximal colon was brought out and a 2-0 Prolene pursestring suture was placed using a pursestring device.  This was secured using 3-0 silk sutures.  A 49mm EEA stapler was brought onto the field.  An anvil was placed in the proximal end and the suture was tied.  The stapler was then inserted into the rectal stump.  The spike was brought out just anterior to the staple line.  An anastomosis was created without tension.  There was no leak under water with air insufflation.    We then switched  to clean instruments, gowns, gloves and drapes.  Methylene blue was given to evaluate for any ureter injury.  The umbilical fascia was closed.  The peritoneum was then closed using a running 2-0 Vicryl suture.  The fascia was closed using 2 running #1 PDS sutures.  The abdomen was insufflated.  Hemostasis was good.  There was no sign of ureter injury.  The omentum was brought over the abdominal organs and the abdomen was desufflated.  The skin of all incisions was closed using 4-0 Vicryl sutures.  Derma bond was applied to the port sites.  A sterile dressing was placed on the incision.  The patient was then awakened from anesthesia and sent to the PACU in stable condition.  All counts correct per OR staff.

## 2014-03-09 NOTE — Anesthesia Postprocedure Evaluation (Signed)
Anesthesia Post Note  Patient: Industrial/product designer  Procedure(s) Performed: Procedure(s) (LRB): LAPAROSCOPIC ASSISTED PARTIAL COLECTOMY, splenic flexure mobilization (N/A)  Anesthesia type: General  Patient location: PACU  Post pain: Pain level controlled  Post assessment: Post-op Vital signs reviewed  Last Vitals: BP 144/90  Pulse 63  Temp(Src) 36.6 C (Oral)  Resp 12  Ht 5\' 6"  (1.676 m)  Wt 298 lb 12.8 oz (135.535 kg)  BMI 48.25 kg/m2  SpO2 97%  LMP 01/23/2014  Post vital signs: Reviewed  Level of consciousness: sedated  Complications: No apparent anesthesia complications

## 2014-03-09 NOTE — Progress Notes (Signed)
Patient ID: Veronica Reyes, female   DOB: 09/06/1956, 58 y.o.   MRN: [id number] Day of Surgery  Subjective: Did well with bowel prep.  Hep gtt off.  Ready for surgery  Objective: Vital signs in last 24 hours: Temp:  [98.3 F (36.8 C)-98.7 F (37.1 C)] 98.3 F (36.8 C) (06/25 0530) Pulse Rate:  [66-78] 74 (06/25 0530) Resp:  [16-18] 18 (06/25 0530) BP: (103-125)/(58-70) 122/70 mmHg (06/25 0530) SpO2:  [94 %-100 %] 97 % (06/25 0530) Weight:  [298 lb 12.8 oz (135.535 kg)] 298 lb 12.8 oz (135.535 kg) (06/25 0606) Last BM Date: 03/08/14  Intake/Output from previous day: 06/24 0701 - 06/25 0700 In: 2690.5 [P.O.:1560; I.V.:1080.5; IV Piggyback:50] Out: -  Intake/Output this shift:    PE: Abd: soft, NT, ND, +BS, obese Heart: regular LungS: CTAB  Lab Results:   Recent Labs  03/08/14 0505 03/09/14 0405  WBC 10.3 9.2  HGB 8.2* 8.3*  HCT 27.6* 27.7*  PLT 313 299   BMET  Recent Labs  03/08/14 0505  NA 141  K 3.7  CL 106  CO2 22  GLUCOSE 91  BUN 9  CREATININE 0.98  CALCIUM 8.8   PT/INR No results found for this basename: LABPROT, INR,  in the last 72 hours CMP     Component Value Date/Time   NA 141 03/08/2014 0505   K 3.7 03/08/2014 0505   CL 106 03/08/2014 0505   CO2 22 03/08/2014 0505   GLUCOSE 91 03/08/2014 0505   BUN 9 03/08/2014 0505   CREATININE 0.98 03/08/2014 0505   CALCIUM 8.8 03/08/2014 0505   PROT 6.8 03/03/2014 1831   ALBUMIN 2.9* 03/03/2014 1831   AST 37 03/03/2014 1831   ALT 30 03/03/2014 1831   ALKPHOS 38* 03/03/2014 1831   BILITOT 0.2* 03/03/2014 1831   GFRNONAA 63* 03/08/2014 0505   GFRAA 73* 03/08/2014 0505   Lipase  No results found for this basename: lipase       Studies/Results: No results found.  Anti-infectives: Anti-infectives   Start     Dose/Rate Route Frequency Ordered Stop   03/09/14 0600  cefoTEtan (CEFOTAN) 2 g in dextrose 5 % 50 mL IVPB     2 g 100 mL/hr over 30 Minutes Intravenous On call to O.R. 03/08/14 0845  03/09/14 0624   03/07/14 1330  ciprofloxacin (CIPRO) tablet 500 mg     500 mg Oral 2 times daily 03/07/14 1257     03/06/14 1500  cefTRIAXone (ROCEPHIN) 1 g in dextrose 5 % 50 mL IVPB  Status:  Discontinued     1 g 100 mL/hr over 30 Minutes Intravenous Every 24 hours 03/06/14 1356 03/07/14 1257       Assessment/Plan  1. Colon mass: will plan on scheduled surgery today.  All questions were answered  2. Endometrial thickening with vaginal bleeding, biopsy shows benign tissue, if further bleeding will manage with Megace per Dr Alease Medina recommendations.   3. Kidney cysts: f/u imaging in 6 months 4. PE- hep gtt restart after surgery  Plan: 1.  LOS: 6 days    THOMAS, ALICIA C. 2/87/8676, 7:20 AM

## 2014-03-10 ENCOUNTER — Encounter (HOSPITAL_COMMUNITY): Payer: Self-pay | Admitting: General Surgery

## 2014-03-10 DIAGNOSIS — D500 Iron deficiency anemia secondary to blood loss (chronic): Secondary | ICD-10-CM

## 2014-03-10 DIAGNOSIS — D259 Leiomyoma of uterus, unspecified: Secondary | ICD-10-CM

## 2014-03-10 LAB — BASIC METABOLIC PANEL
BUN: 4 mg/dL — AB (ref 6–23)
CALCIUM: 8.6 mg/dL (ref 8.4–10.5)
CO2: 21 mEq/L (ref 19–32)
Chloride: 108 mEq/L (ref 96–112)
Creatinine, Ser: 0.89 mg/dL (ref 0.50–1.10)
GFR calc Af Amer: 82 mL/min — ABNORMAL LOW (ref >90)
GFR calc non Af Amer: 71 mL/min — ABNORMAL LOW (ref >90)
GLUCOSE: 97 mg/dL (ref 70–99)
POTASSIUM: 4.1 meq/L (ref 3.7–5.3)
Sodium: 140 mEq/L (ref 137–147)

## 2014-03-10 LAB — CBC
HCT: 28.3 % — ABNORMAL LOW (ref 36.0–46.0)
HEMOGLOBIN: 8.7 g/dL — AB (ref 12.0–15.0)
MCH: 29.2 pg (ref 26.0–34.0)
MCHC: 30.7 g/dL (ref 30.0–36.0)
MCV: 95 fL (ref 78.0–100.0)
Platelets: 220 10*3/uL (ref 150–400)
RBC: 2.98 MIL/uL — ABNORMAL LOW (ref 3.87–5.11)
RDW: 16.2 % — ABNORMAL HIGH (ref 11.5–15.5)
WBC: 8.3 10*3/uL (ref 4.0–10.5)

## 2014-03-10 MED ORDER — BIOTENE DRY MOUTH MT LIQD
15.0000 mL | Freq: Two times a day (BID) | OROMUCOSAL | Status: DC
  Administered 2014-03-10 – 2014-03-14 (×9): 15 mL via OROMUCOSAL

## 2014-03-10 MED ORDER — KCL IN DEXTROSE-NACL 20-5-0.45 MEQ/L-%-% IV SOLN
INTRAVENOUS | Status: DC
  Administered 2014-03-10 – 2014-03-11 (×2): via INTRAVENOUS
  Filled 2014-03-10 (×3): qty 1000

## 2014-03-10 NOTE — Progress Notes (Signed)
PT Cancellation Note  Patient Details Name: Veronica Reyes MRN: [id number] DOB: 1956/03/14   Cancelled Treatment:    Reason Eval/Treat Not Completed: Fatigue/lethargy limiting ability to participate--pt requested to rest after transfer from ICU. Offered to check back on tomorrow-pt agreeable to this. Will check back on tomorrow to perform evaluation.    Weston Anna, MPT Pager: [phone]

## 2014-03-10 NOTE — Consult Note (Addendum)
Veronica Reyes  Telephone:([phone]) [place]   Veronica Reyes  DOB: July 24, 1956  MR#: [id number]  CSN#: [id number]    Requesting Physician: [facility]  Primary MD:   Reason for consult  Colon Cancer                                     History of PE                                                        HPI:   58 y.o.  female  Asked to see in consultation for evaluation of colon cancer. She was in her usual state of health until March  of this year,when she began to have episodes of bright red blood per rectum with bowel movements. She was referred to GI for a colonoscopy by Dr. Benson Norway on 12/22/13. This descending colon mass was biopsied and showed at least intramucosal adenocarcinoma with negative stains (unable to transport copy to this note) .  CT of the chest, abdomen and pelvis with contrast were ordered which showed an Ill-defined mass within the proximal sigmoid colon / distal descending colon. No evidence of bowel obstruction.  Metastatic lymph node along the mesenteric border of the colon adjacent to the mass and a  tiny indeterminate 5 mm lesion within the central left hepatic lobe were seen. Also noted, sclerotic lesion in the posterior aspect of the right iliac bone, indeterminate.CEA was 0.8 (on 5/6)     Incidental Left lower lobe pulmonary embolism on 4/15  was noted for which further oncological  workup plans were placed on hold and she was anticoagulated, discharged with  Xarelto 15 mg bid .  Risk factors include malignancy, history of CVA per patient report (remote) ,family history of stroke, obesity, sedentary life and hypertension. Denies recent hormonal treatments.   Status was complicated when her  Hb was noted to be 5.8  Xarelto was discontinued.  She received 3 units of blood with good response   A new CT of the abdomen and pelvis on 6/22   was remarkable again for  A 3.8 x 4.1 x 5.8 cm diameter distal descending  colon mass compatible with a primary colonic malignancy.  9 mm short axis lymph node in the adjacent colonic mesenteric.bilateral renal cysts were noted as well.   On 6/25, she had a laparoscopic left partial colectomy by Dr. Marcello Moores. This was well tolerated. Pathology report is pending  In addition, she has been experiencing very heavy menstrual periods over the last month, in the setting of menorrhagia,  without clots. Vaginal ultrasound on 6/20 showed fibroids and a  heterogeneous prominent endometrium which required a consultation by Dr. Hulan Fray, gyn. She performed endometrial biopsy on 6/22, results negative for malignancy. She is to follow as outpatient. Of note, she has a prior history of breast cancer, Left CIS in situ, s/p lumpectomy in 2005 with recurrence in 2011 requiring lumpectomy and radiation under the direction of Dr. Sondra Come. She was being followed at the [facility] by Dr. Truddie Coco.      Past medical history:      Past Medical History  Diagnosis Date  . Hypertension   . Cancer  left breast cancer x2  . Breast CA     radiation and surgery  . Shortness of breath     sob with exertion. dx. with Pulmonary emboli 4'15 ,tx. Xarelto,Lovenox used for [employer].  . Pulmonary embolism     "blood clot in lungs" -dx. 1'60 with CT Chest.   History of CVA, remote, no residual   Past surgical history:      Past Surgical History  Procedure Laterality Date  . Cesarean section    . Breast surgery      '11- Left breast lumpectomy  . Cholecystectomy      Laparoscopic 20 yrs ago    Medications:  Prior to Admission:  Prescriptions prior to admission  Medication Sig Dispense Refill  . diphenhydramine-acetaminophen (TYLENOL PM) 25-500 MG TABS Take 1 tablet by mouth at bedtime as needed (Sleep).      . ferrous sulfate 325 (65 FE) MG tablet Take 325 mg by mouth every morning.      . hydrochlorothiazide (HYDRODIURIL) 25 MG tablet Take 25 mg by mouth every morning.       . metoprolol  (LOPRESSOR) 50 MG tablet Take 50 mg by mouth 2 (two) times daily.      . Multiple Vitamin (MULTIVITAMIN WITH MINERALS) TABS tablet Take 1 tablet by mouth daily.      . Rivaroxaban (XARELTO) 15 MG TABS tablet Take 15 mg by mouth 2 (two) times daily. To stop x 24 hours prior to surgery per Dr. Marcello Moores        FUX:NATFTDDUKGURK, diphenhydrAMINE, diphenhydrAMINE, naloxone, ondansetron (ZOFRAN) IV, sodium chloride, zolpidem  Allergies: No Known Allergies   ROS: Constitutional: Denies fevers, chills or abnormal night sweats Eyes: Denies blurriness of vision, double vision or watery eyes Ears, nose, mouth, throat, and face: Denies mucositis or sore throat Respiratory: Denies cough, dyspnea or wheezes Cardiovascular: Denies palpitation, chest discomfort, minimal pedal edema Gastrointestinal: denies nausea, vomiting, change on bowel habits, but red blood per rectum as described in HPI, now resolved Skin: Denies abnormal skin rashes Lymphatics: Denies new lymphadenopathy or easy bruising Neurological:Denies numbness, tingling or new weaknesses Behavioral/Psych: Mood is stable, no new changes  All other systems were reviewed with the patient and are negative.   Family History:    Family History  Problem Relation Age of Onset  . Hypertension Sister   . Cancer Cousin    Stroke in [place] and Sister.   Social History:  reports that she has never smoked. She has never used smokeless tobacco. She reports that she does not drink alcohol or use illicit drugs. Married, one daughter age 19. Lives in [place].    Physical Exam    ECOG PERFORMANCE STATUS: 1  Filed Vitals:   03/10/14 0400  BP: 124/60  Pulse: 78  Temp: 99.1 F (37.3 C)  Resp: 16   Filed Weights   03/08/14 0544 03/09/14 0606 03/10/14 0400  Weight: 299 lb 2.6 oz (135.7 kg) 298 lb 12.8 oz (135.535 kg) 319 lb 3.6 oz (144.8 kg)    GENERAL:alert, no distress and comfortable SKIN: skin color, texture, turgor are normal, no  rashes or significant lesions EYES: normal, conjunctiva are pink and non-injected, sclera clear OROPHARYNX:no exudate, no erythema and lips, buccal mucosa, and tongue normal  NECK: supple, thyroid normal size, non-tender, without nodularity LYMPH:  no palpable lymphadenopathy in the cervical, axillary or inguinal LUNGS: clear to auscultation and percussion with normal breathing effort HEART: regular rate & rhythm and no murmurs and no lower extremity edema  ABDOMEN:abdomen soft,  Morbidly obese, incisional tenderness and normal bowel sounds Musculoskeletal:no cyanosis of digits and no clubbing  PSYCH: alert & oriented x 3 with fluent speech NEURO: no focal motor/sensory deficits   Lab results:       CBC  Recent Labs Lab 03/04/14 0313  03/07/14 0400 03/08/14 0505 03/09/14 0405 03/09/14 1358 03/10/14 0402  WBC 11.0*  < > 10.3 10.3 9.2 13.6* 8.3  HGB 7.3*  < > 8.2* 8.2* 8.3* 10.1* 8.7*  HCT 24.3*  < > 27.0* 27.6* 27.7* 33.1* 28.3*  PLT 330  < > 304 313 299 271 220  MCV 95.3  < > 94.7 96.5 96.5 95.4 95.0  MCH 28.6  < > 28.8 28.7 28.9 29.1 29.2  MCHC 30.0  < > 30.4 29.7* 30.0 30.5 30.7  RDW 15.4  < > 15.3 15.9* 16.3* 16.4* 16.2*  LYMPHSABS 3.4  --   --   --   --   --   --   MONOABS 0.7  --   --   --   --   --   --   EOSABS 0.1  --   --   --   --   --   --   BASOSABS 0.0  --   --   --   --   --   --   < > = values in this interval not displayed.  Anemia panel:  No results found for this basename: VITAMINB12, FOLATE, FERRITIN, TIBC, IRON, RETICCTPCT,  in the last 72 hours   Chemistries   Recent Labs Lab 03/04/14 0915 03/05/14 0353 03/06/14 0346 03/08/14 0505 03/10/14 0402  NA 138 140 141 141 140  K 3.6* 3.6* 3.8 3.7 4.1  CL 100 103 106 106 108  CO2 24 26 23 22 21   GLUCOSE 110* 95 94 91 97  BUN 7 7 9 9  4*  CREATININE 0.87 0.95 0.91 0.98 0.89  CALCIUM 8.9 9.0 8.7 8.8 8.6     Coagulation profile  Recent Labs Lab 03/03/14 1829  INR 1.76*    Iron/TIBC/Ferritin    Component Value Date/Time   IRON 17* 03/04/2014 0313   TIBC 366 03/04/2014 0313   FERRITIN 9* 03/04/2014 0313   Vit B12  279 Folate  17.9  Urine Studies No results found for this basename: UACOL, UAPR, USPG, UPH, UTP, UGL, UKET, UBIL, UHGB, UNIT, UROB, ULEU, UEPI, UWBC, URBC, UBAC, CAST, CRYS, UCOM, BILUA,  in the last 72 hours  Studies:      Mr Abdomen W Wo Contrast  03/06/2014    COMPARISON:  CT of the abdomen and pelvis 03/04/2014.  FINDINGS: There are several lesions in the kidneys bilaterally which are low signal intensity on T1 weighted images, high signal intensity on T2 weighted images, and do not enhance, compatible with simple cysts. The largest simple cyst measures 2.9 cm in diameter in the medial aspect of the interpolar region of the left kidney. The largest renal lesion overall is in the interpolar region of the right kidney measuring 4.4 x 3.7 cm. This lesion is also generally low T1 signal intensity and high T2 signal intensity, without internal enhancement. However, there is some very subtle nodularity along the anterior margin of the lesion best appreciated on image 37 of series 8, which corresponds to some minimal enhancement on post gadolinium images, best appreciated on image 59 of series 1102 (this area of potential mural nodularity is favored to be normal adjacent renal parenchyma rather than a  true mural nodule). This lesion also has multiple thin internal nonenhancing septations, which are best appreciated on coronal T2 weighted images (image 29 of series 9). Diffusion-weighted sequences demonstrate some diffusion restriction (lesion is dark on ADC map).  In addition, there is some amorphous soft tissue located beneath segment 1 of the liver, posterior to the portal vein, anterior to the IVC and upper pole of the right kidney, posterior the second portion of the duodenum and immediately above and posterior to the head and uncinate process of the  pancreas. This amorphous soft tissue is intimately associated with all adjacent structures, and is T2 hyperintense on respiratory triggered fat sat T2 weighted images (best appreciated on images 24-30 of series 4), hypointense spine T1 weighted images compared to normal pancreatic and hepatic parenchyma, demonstrates diffusion restriction (images 18 of series 3 and 21 of series 300), and demonstrates slightly differential enhancement when compared to either the hepatic or pancreatic parenchyma (best appreciated on subtraction image 37 of series 11102).  There are 2 tiny sub cm lesions in the liver which are low T1 signal intensity, high T2 signal intensity, and do not enhance, compatible with tiny simple cysts or biliary hamartomas. Status post cholecystectomy. The appearance of the spleen and bilateral adrenal glands is unremarkable.  IMPRESSION: 1. The lesion of concern in the lateral aspect of the interpolar region of the right kidney has indeterminate imaging characteristics, but is favored to be benign, and is currently classified as a Bosniak class IIF lesion. Repeat MRI with and without IV gadolinium in 6 months is recommended to ensure stability. This recommendation follows ACR consensus guidelines: Managing Incidental Findings on Abdominal CT: White Paper of the ACR Incidental Findings Committee. J Am Coll Radiol 2010;7:754-773. 2. In addition, there is some amorphous soft tissue in the portacaval and hepatoduodenal ligament regions which has indeterminate but potentially malignant imaging characteristics (with diffusion restriction). This is of uncertain etiology and significance, but could represent a lymphoma, or potentially nodal metastatic disease. Correlation with PET-CT is recommended to assess for hypermetabolism. Should this lesion prove to be hypermetabolic on PET-CT, further evaluation with endoscopic ultrasound and biopsy would be suggested. 3. Multiple additional simple renal cysts. These  results will be called to the ordering clinician or representative by the Radiologist Assistant, and communication documented in the PACS or zVision Dashboard.   Electronically Signed   By: Vinnie Langton M.D.   On: 03/06/2014 08:57   US Transvaginal Non-ob  03/04/2014    COMPARISON:  None  FINDINGS: Uterus  Measurements: 9.1 x 7.2 x 7.1 cm. Uterus is heterogeneous, with 3 mildly hypoechoic mass is identified consistent fibroids the largest measuring 2.6 cm. In the fundus there is an intramural fibroid measuring 2.2 cm. The 2.6 cm fibroid is seen in the subendometrial position in the anterior body of the uterus, with an adjacent 1.2 cm fibroid. The uterus is retroverted.  Endometrium  Thickness: 14 mm.  Heterogeneous echotexture  Right ovary  Measurements: 30 x 20 x 18 mm. Normal appearance/no adnexal mass.  Left ovary  Not visualized  Other findings  No free fluid.  IMPRESSION: 1. 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 2. Heterogeneous prominent endometrium which could be due to hyperplasia, polyp, or carcinoma.   Electronically Signed   By: Skipper Cliche M.D.   On: 03/04/2014 09:36   US Pelvis Complete COMPARISON:  None  FINDINGS: Uterus  Measurements: 9.1 x 7.2 x  7.1 cm. Uterus is heterogeneous, with 3 mildly hypoechoic mass is identified consistent fibroids the largest measuring 2.6 cm. In the fundus there is an intramural fibroid measuring 2.2 cm. The 2.6 cm fibroid is seen in the subendometrial position in the anterior body of the uterus, with an adjacent 1.2 cm fibroid. The uterus is retroverted.  Endometrium  Thickness: 14 mm.  Heterogeneous echotexture  Right ovary  Measurements: 30 x 20 x 18 mm. Normal appearance/no adnexal mass.  Left ovary  Not visualized  Other findings  No free fluid.  IMPRESSION: 1. 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 2.  Heterogeneous prominent endometrium which could be due to hyperplasia, polyp, or carcinoma.   Electronically Signed   By: Skipper Cliche M.D.   On: 03/04/2014 09:36   Ct Abdomen Pelvis W Contrast  03/04/2014   FINDINGS: Lung bases clear.  Gallbladder surgically absent.  BILATERAL renal cysts, largest at RIGHT inferior pole questionably demonstrating mural nodularity, overall lesion 4.1 x 4.1 x 3.6 cm.  4 mm low-attenuation focus anteriorly in medial segment LEFT lobe liver image 21 unchanged.  Question new 6 mm low-attenuation nodule medial segment LEFT lobe liver image 21.  Remainder of liver, spleen, pancreas, kidneys, and adrenal glands normal appearance.  Central low attenuation within uterus could be related to phase of menses.  Unremarkable bladder, ureters, and adnexae.  Normal appendix.  Mass at distal descending colon 3.8 x 4.1 cm image 59, approximately 5.8 cm length.  9 mm short axis lymph node in mesentery medial to the descending colon a previously 10 mm.  Stomach and bowel loops otherwise unremarkable.  No mass, adenopathy, free fluid or free air.  Mild scattered atherosclerotic calcification.  No acute osseous findings.  IMPRESSION: 3.8 x 4.1 x 5.8 cm diameter distal descending colon mass compatible with a primary colonic malignancy.  9 mm short axis lymph node in the adjacent colonic mesenteric.  BILATERAL renal cysts, largest of which at the RIGHT kidney shows questionable area of mural nodularity; followup nonemergent characterization of this lesion by MR imaging with and without contrast recommended to exclude cystic renal neoplasm.  Two nonspecific low-attenuation foci within liver.   Electronically Signed   By: Lavonia Dana M.D.   On: 03/04/2014 11:12   Dg Chest Port 1 View  03/03/2014   CLINICAL DATA:  Shortness of breath with exertion  EXAM: PORTABLE CHEST - 1 VIEW  COMPARISON:  None.  FINDINGS: Moderate cardiac enlargement similar to prior study. Mild vascular congestion with no  evidence of edema or consolidation.  IMPRESSION: Cardiac enlargement with minimal vascular congestion and no pulmonary edema   Electronically Signed   By: Skipper Cliche M.D.   On: 03/03/2014 19:07   Ct Chest W Contrast  01/06/2014  COMPARISON: None. FINDINGS: CT CHEST FINDINGS No axillary or supraclavicular lymphadenopathy. No mediastinal hilar lymphadenopathy. No pericardial fluid. Esophagus normal. No central pulmonary embolism. There are several small tubular filling defect within the left lower lobe pulmonary artery extent the lower lobe lower lobes segments and the superior segment of the little lower lobe. Small filling defect within the left upper lobe additionally (image 18. Review of the lung windows demonstrates no pulmonary nodules. No evidence of pulmonary infarction. There is mild atelectasis at left lung base There is a small low-density lesion in the medial left hepatic lobe measuring 5 mm (segment 4A). Post cholecystectomy. The pancreas, spleen, adrenal glands are normal. There  are bilateral simple fluid density renal cysts. The stomach, small bowel, appendix, and cecum are normal. The colon demonstrates an ill-defined mass at the junction of the descending colon and sigmoid colon. This ill-defined mass involves approximately 6 cm segment of the colon best seen on coronal image 59. There is no evidence of obstruction proximal to this mass. There is a stool in the sigmoid colon and rectum. There is a abnormal lymph node along the mesenteric border of the descending colon adjacent to the mass measuring 15 mm (image 80, series 3). The abdominal aorta is normal in caliber. No retroperitoneal periportal lymphadenopathy. No free fluid the pelvis. The uterus and ovaries by. The bladder is normal. There is sclerosis within the posterior aspect of the right iliac bone (image 92, series 3.   IMPRESSION: 1. Acute pulmonary within the left lower lobe pulmonary arteries. Overall clot burden is mild to  moderate. 2. Ill-defined mass within the proximal sigmoid colon / distal descending colon. No evidence of bowel obstruction. 3. 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 4. Tiny indeterminate 5 mm lesion within the central left hepatic lobe. 5. Sclerotic lesion in the posterior aspect of the right iliac bone is also indeterminate but could be evaluated with FDG PET-CT scan. Critical Value/emergent results were called by telephone at the time of interpretation on 01/06/2014 at 4:02 PM to Dr. Carol Ada , who verbally acknowledged these results. Electronically Signed By: Suzy Bouchard M.D. On: 01/06/2014 16:09   Ct Abdomen Pelvis W Contrast  01/06/2014     ABDOMEN AND PELVIS FINDINGS There is a small low-density lesion in the medial left hepatic lobe measuring 5 mm (segment 4A). Post cholecystectomy. The pancreas, spleen, adrenal glands are normal. There are bilateral simple fluid density renal cysts. The stomach, small bowel, appendix, and cecum are normal. The colon demonstrates an ill-defined mass at the junction of the descending colon and sigmoid colon. This ill-defined mass involves approximately 6 cm segment of the colon best seen on coronal image 59. There is no evidence of obstruction proximal to this mass. There is a stool in the sigmoid colon and rectum. There is a abnormal lymph node along the mesenteric border of the descending colon adjacent to the mass measuring 15 mm (image 80, series 3). The abdominal aorta is normal in caliber. No retroperitoneal periportal lymphadenopathy. No free fluid the pelvis. The uterus and ovaries, and bladder are normal. There is sclerosis within the posterior aspect of the right iliac bone (image 92, series 3). IMPRESSION: 1. Acute pulmonary within the left lower lobe pulmonary arteries. Overall clot burden is mild to moderate. 2. Ill-defined mass within the proximal sigmoid colon / distal descending colon. No evidence of  bowel obstruction. 3. 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 4. Tiny indeterminate 5 mm lesion within the central left hepatic lobe. 5. Sclerotic lesion in the posterior aspect of the right iliac bone is also indeterminate but could be evaluated with FDG PET-CT scan. Critical Value/emergent results were called by telephone at the time of interpretation on 01/06/2014 at 4:02 PM to Dr. Carol Ada , who verbally acknowledged these results. Electronically Signed By: Suzy Bouchard M.D. On: 01/06/2014 16:09   REPORT OF SURGICAL PATHOLOGY  Case #: [id number] Patient Name: MARBETH, SMEDLEY PID: [id number] Pathologist: Janae Bridgeman L. Golden Circle, MD DOB/Age [birth year] (Age: 14) Gender: F Date Taken: 05/02/2005 Date Received: 05/02/2005  FINAL DIAGNOSIS   NEEDLE CORE BIOPSY,  LEFT BREAST MASS 12 O'CLOCK: - MALIGNANT PAPILLARY EPITHELIUM WITH EXTENSIVE INFARCTION, SEE COMMENT  COMMENT The papillary fragments are all detached. Only rare viable fragments are present, the majority of the specimen is infarcted. The papillary epithelium is bland, columnar, monotonous and present on delicate fibrovascular cores. A CD10, calponin and SMA stain are performed. All the stains confirm the absence of a myoepithelial layer. 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 An invasive component cannot be documented in the current material due to the fragmented nature of the specimen. The final pathology results were discussed with Dr Glennon Mac by Dr Golden Circle on May 07, 2005. I would prefer to obtain prognostic markers from the excisional specimen.   REPORT OF SURGICAL PATHOLOGY  Case #: [id number] Patient Name: POLLYANN, ROA PID: [id number] Pathologist: Jaquelyn Bitter A. Rodney Cruise, MD DOB/Age Apr 27, 1956 (Age: 41) Gender: F Date Taken: 08/15/2005 Date Received: 08/15/2005  FINAL DIAGNOSIS   BREAST, LEFT, RE-EXCISION OF PREVIOUS LUMPECTOMY  SITE: - RESIDUAL MICROSCOPIC FOCUS OF DUCTAL CARCINOMA IN SITU,INTERMEDIATE GRADE,PAPILLARY TYPE. - PREVIOUS BIOPSY CAVITY WITH HEMORRHAGE AND ADJACENT TISSUEWITH FAT NECROSIS ANDFOREIGN BODY GIANT CELL REACTION. - MARGINS FREE OF TUMOR.  COMMENT Block labeled C shows a microscopic focus of ductal carcinoma insitu, intermediate grade, with papillary features similar tothose seen in this patient' s previous biopsy ([id number]). Noinvasive carcinoma is identified. (EAA:glk 08-18-05) REPORT OF SURGICAL PATHOLOGY  Case #: [id number] Patient Name: ANNASTACIA, DUBA Office Chart Number: [id number]  MRN: [id number] Pathologist: Lyndon Code M.D., Vonna Kotyk DOB/Age January 06, 1956 (Age: 36) Gender: F Date Taken: 12/19/2009 Date Received: 12/19/2009  FINAL DIAGNOSIS   1. BREAST, LUMPECTOMY, LEFT : - INTRACYSTIC PAPILLARY CARCINOMA, GRADE I/III, MULTIPLE FOCI. - CARCINOMA IS LESS THAN 0.05 CM TO THE DEEP MARGIN AND 0.05 CM FROM THE MEDIAL MARGIN OF THE MAIN SPE CIMEN. - DUCTAL CARCINOMA IN-SITU, LOW GRADE, FOCAL. - LYMPHOVASCULAR INVASION, FOCAL. - SEE ONCOLOGY TABLE BELOW. 2. BREAST, EXCISION, SUPERIOR, MEDIAL, DEEP : - FIBROCYSTIC CHANGES. - THERE IS NO EVIDENCE OF MALIGNANCY. - SEE COMMENT. 3. BREAST, EXCISION, ADDITIONAL MARGIN : - FIBROCYSTIC CHANGES. - THERE IS NO EVIDENCE OF MALIGNANCY. - SEE COMMENT.   CLINICAL HISTORY  SPECIMEN(S) OBTAINED 1. Breast, lumpectomy, Left 2. Breast, excision, Superior, Medial, Deep 3. Breast, excision, Additional Margin  SPECIMEN COMMENTS: 1. Left breast cancer (isv)     Assessmnent/Plan:58 y.o. female with   1. 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 Patient had  partial colectomy with path report currently pending Will follow with recommendations once results available  2. History of PE She has a recent diagnosis of pulmonary embolism, likely secondary to malignancy in the setting of family history  for stroke (and possible stroke history in patient as well) . Lower extremity dopplers negative She was on Xarelto, discontinued and switched to Heparin per pharmacy No further bleeding issues. She may need to follow up at the [facility] on a regular basis to prevent further episodes. She may need a hypercoagulable panel as outpatient as well.   3. History of Breast Cancer Left CIS in situ, s/p lumpectomy in 2006 and 2011 by Dr. Margot Chimes, and radiation in 2011 by Dr. Sondra Come Last mammogram on 4/27 negative  4. Anemia In the setting of GIB with hemoccult positive, iron deficiency anemia  as well as fibroids with subsequent heavy periods Supportive transfusions recommended Iron supplements recommended.   5. Uterine Fibroids  Negative endometrial biopsy Will need GYN evaluation as outpatient  6. Full Code  Other medical issues as per admitting  team.   Thank you for the referral.   Rondel Jumbo, PA-C 03/10/2014    ADDENDUM:  Patient seen and examined. I agree with above. Patient is a pleasant 58 year-old female with history of breast cancer, now colon cancer s/p laparoscopic-assisted partial colectomy (03/09/2014), PE (01/06/2014) and history uterine fibroids admitted on 03/03/2014 with profound anemia (hgb 5.8).  We are consulted for anticoagulation recommendations.  Risk for clotting: active malignancy, morbid obesity,  history of clots, immobilization, hospitalization, recent surgery.  Risk of bleeding: prior uterine bleed with negative biopsy for malignancy.  We discussed the risks of clotting and bleeding in detail and recommend continued anticoagulation as her clotting risks are high for further clots and potentially life-threatening PE.   When her H/H stable and ok'ed by surgery, please recommend resume full dose anticoagulation with lovenox (ideally favored in active malignancy).  Patient does report inability to afford lovenox however.  Other options include xalreto and to start  megace to decrease the chance of bleeding (per Gyn recommends). We will certainly follow her CBC closely.  If active bleeding while on anticoagulation despite megace, we will then have to consider IVC filter placement.     Recommendations: --Please resume anticoagulation with lovenox (1 mg/kg bid) (if affordable on discharge ) or xalreto with megace as needed to decrease uterine bleed and when ok'ed from surgery. If bleeding despite above, consider IVC filter placement. Follow CBC weekly for the next several weeks.  --Feraheme 1,020 mg intravenously based on low ferritin and iron defiency.   I personally saw this patient and performed a substantive portion of this encounter with the listed APP documented above.   CHISM, DAVID, MD

## 2014-03-10 NOTE — Progress Notes (Signed)
1 Day Post-Op Lap L colectomy Subjective: Did well overnight.  Hasn't been out of bed yet.  Pain controlled.  No nausea  Objective: Vital signs in last 24 hours: Temp:  [97.9 F (36.6 C)-99.1 F (37.3 C)] 99.1 F (37.3 C) (06/26 0400) Pulse Rate:  [60-80] 78 (06/26 0400) Resp:  [11-21] 16 (06/26 0400) BP: (99-151)/(60-94) 124/60 mmHg (06/26 0400) SpO2:  [93 %-100 %] 100 % (06/26 0400) Weight:  [319 lb 3.6 oz (144.8 kg)] 319 lb 3.6 oz (144.8 kg) (06/26 0400)   Intake/Output from previous day: 06/25 0701 - 06/26 0700 In: 4476 [I.V.:4121; Blood:355] Out: 1335 [Urine:1135; Blood:200] Intake/Output this shift:     General appearance: alert and cooperative GI: normal findings: soft, non-tender  Incision: no significant drainage, no significant erythema  Lab Results:   Recent Labs  03/09/14 1358 03/10/14 0402  WBC 13.6* 8.3  HGB 10.1* 8.7*  HCT 33.1* 28.3*  PLT 271 220   BMET  Recent Labs  03/08/14 0505 03/10/14 0402  NA 141 140  K 3.7 4.1  CL 106 108  CO2 22 21  GLUCOSE 91 97  BUN 9 4*  CREATININE 0.98 0.89  CALCIUM 8.8 8.6   PT/INR No results found for this basename: LABPROT, INR,  in the last 72 hours ABG No results found for this basename: PHART, PCO2, PO2, HCO3,  in the last 72 hours  MEDS, Scheduled . enoxaparin (LOVENOX) injection  40 mg Subcutaneous Q24H  . morphine   Intravenous 6 times per day    Studies/Results: No results found.  Assessment: s/p Procedure(s): LAPAROSCOPIC ASSISTED PARTIAL COLECTOMY, splenic flexure mobilization Patient Active Problem List   Diagnosis Date Noted  . E. coli UTI (urinary tract infection) 03/06/2014  . Colon cancer 03/04/2014  . Menorrhagia with irregular cycle 03/04/2014  . Acute blood loss anemia 03/04/2014  . Fibroids 03/04/2014  . Iron deficiency anemia 03/03/2014  . Symptomatic anemia 03/03/2014  . Pulmonary embolism 01/07/2014  . Hepatic lesion 01/07/2014  . Breast cancer 01/07/2014  .  Essential hypertension, benign 01/07/2014  . History of BRBPR (bright red blood per rectum) 01/07/2014    Expected post op course  Plan: Advance diet to clears MIV at 4ml/h Follow Hgb and monitor for signs of bleeding Ambulate, PT consulted Will consult oncology for recommendations on anticoagulation for PE found on Chest CT   LOS: 7 days     .Rosario Adie, [facility], [place] [phone]   03/10/2014 7:33 AM

## 2014-03-11 LAB — CBC
HCT: 26.1 % — ABNORMAL LOW (ref 36.0–46.0)
Hemoglobin: 8.2 g/dL — ABNORMAL LOW (ref 12.0–15.0)
MCH: 29.8 pg (ref 26.0–34.0)
MCHC: 31.4 g/dL (ref 30.0–36.0)
MCV: 94.9 fL (ref 78.0–100.0)
PLATELETS: 213 10*3/uL (ref 150–400)
RBC: 2.75 MIL/uL — AB (ref 3.87–5.11)
RDW: 15.8 % — AB (ref 11.5–15.5)
WBC: 8.8 10*3/uL (ref 4.0–10.5)

## 2014-03-11 LAB — BASIC METABOLIC PANEL
BUN: 4 mg/dL — ABNORMAL LOW (ref 6–23)
CALCIUM: 8.6 mg/dL (ref 8.4–10.5)
CO2: 23 mEq/L (ref 19–32)
CREATININE: 0.78 mg/dL (ref 0.50–1.10)
Chloride: 104 mEq/L (ref 96–112)
GFR calc Af Amer: >90 mL/min (ref >90)
GLUCOSE: 98 mg/dL (ref 70–99)
Potassium: 3.8 mEq/L (ref 3.7–5.3)
SODIUM: 137 meq/L (ref 137–147)

## 2014-03-11 MED ORDER — SODIUM CHLORIDE 0.9 % IJ SOLN
3.0000 mL | INTRAMUSCULAR | Status: DC | PRN
  Administered 2014-03-11: 3 mL via INTRAVENOUS

## 2014-03-11 NOTE — Progress Notes (Signed)
2 Days Post-Op  Subjective: No complaints other than soreness and cough  Objective: Vital signs in last 24 hours: Temp:  [98.3 F (36.8 C)-98.8 F (37.1 C)] 98.5 F (36.9 C) (06/27 0503) Pulse Rate:  [77-85] 77 (06/27 0503) Resp:  [14-20] 18 (06/27 0503) BP: (108-135)/(63-79) 114/69 mmHg (06/27 0503) SpO2:  [96 %-100 %] 100 % (06/27 0503) Last BM Date: 03/08/14  Intake/Output from previous day: 06/26 0701 - 06/27 0700 In: 1396.7 [P.O.:480; I.V.:916.7] Out: 1790 [Urine:1790] Intake/Output this shift:    Resp: rhonchi bilaterally Cardio: regular rate and rhythm GI: soft, appropriately tender. good bs. no blood per rectum per pt  Lab Results:   Recent Labs  03/10/14 0402 03/11/14 0415  WBC 8.3 8.8  HGB 8.7* 8.2*  HCT 28.3* 26.1*  PLT 220 213   BMET  Recent Labs  03/10/14 0402 03/11/14 0415  NA 140 137  K 4.1 3.8  CL 108 104  CO2 21 23  GLUCOSE 97 98  BUN 4* 4*  CREATININE 0.89 0.78  CALCIUM 8.6 8.6   PT/INR No results found for this basename: LABPROT, INR,  in the last 72 hours ABG No results found for this basename: PHART, PCO2, PO2, HCO3,  in the last 72 hours  Studies/Results: No results found.  Anti-infectives: Anti-infectives   Start     Dose/Rate Route Frequency Ordered Stop   03/09/14 1800  cefoTEtan (CEFOTAN) 2 g in dextrose 5 % 50 mL IVPB     2 g 100 mL/hr over 30 Minutes Intravenous Every 12 hours 03/09/14 1458 03/09/14 1856   03/09/14 0600  cefoTEtan (CEFOTAN) 2 g in dextrose 5 % 50 mL IVPB     2 g 100 mL/hr over 30 Minutes Intravenous On call to O.R. 03/08/14 0845 03/09/14 0624   03/07/14 1330  ciprofloxacin (CIPRO) tablet 500 mg  Status:  Discontinued     500 mg Oral 2 times daily 03/07/14 1257 03/09/14 1458   03/06/14 1500  cefTRIAXone (ROCEPHIN) 1 g in dextrose 5 % 50 mL IVPB  Status:  Discontinued     1 g 100 mL/hr over 30 Minutes Intravenous Every 24 hours 03/06/14 1356 03/07/14 1257      Assessment/Plan: s/p  Procedure(s): LAPAROSCOPIC ASSISTED PARTIAL COLECTOMY, splenic flexure mobilization (N/A) OOB Monitor hg D/c foley Continue pca  LOS: 8 days    TOTH III,PAUL S 03/11/2014

## 2014-03-11 NOTE — Progress Notes (Signed)
Report received from C. Myers,RN. No change in assessment. Will continue present plan of care. Stacey Drain

## 2014-03-11 NOTE — Evaluation (Signed)
Physical Therapy Evaluation Patient Details Name: Veronica Reyes MRN: [id number] DOB: 09-Mar-1956 Today's Date: 03/11/2014   History of Present Illness  58 yo female s/p lap partial colectomy, PE.   Clinical Impression  On eval,pt required Mod assist for mobility-able to perform stand pivot from bed to BSc with use of walker. Increased time to complete all tasks. Encouraged to ambulate with nursing later today with assistance.     Follow Up Recommendations Home health PT;Supervision/Assistance - 24 hour    Equipment Recommendations   (to be determined; walker possibly)    Recommendations for Other Services OT consult     Precautions / Restrictions Precautions Precautions: Fall Precaution Comments: abdominal surgery Restrictions Weight Bearing Restrictions: No      Mobility  Bed Mobility Overal bed mobility: Needs Assistance Bed Mobility: Supine to Sit;Sit to Supine     Supine to sit: HOB elevated     General bed mobility comments: HOB elevated to max upright position. Heavy use of bedrails. Mod assist for trunk to upright and positioning at EOB. Utilized bedpad for scooting. Increased time.   Transfers Overall transfer level: Needs assistance Equipment used: Rolling walker (2 wheeled) Transfers: Sit to/from [facility] Sit to Stand: From elevated surface;Min assist Stand pivot transfers: Min assist       General transfer comment: assist to rise, stabilize, control descent. vcs safety, technique, hand placement. Stand pivot, bed>bsc with walker. Increased time.   Ambulation/Gait                Stairs            Wheelchair Mobility    Modified Rankin (Stroke Patients Only)       Balance                                             Pertinent Vitals/Pain 5/10 abdomen with activity. PCA encouraged.     Home Living Family/patient expects to be discharged to:: Private residence Living Arrangements:  Spouse/significant other;Alone   Type of Home: House Home Access: Stairs to enter Entrance Stairs-Rails: Right Entrance Stairs-Number of Steps: 3 Home Layout: One level Home Equipment: Cane - single point      Prior Function Level of Independence: Independent               Hand Dominance        Extremity/Trunk Assessment               Lower Extremity Assessment: Generalized weakness      Cervical / Trunk Assessment: Normal  Communication   Communication: No difficulties  Cognition Arousal/Alertness: Awake/alert Behavior During Therapy: WFL for tasks assessed/performed Overall Cognitive Status: Within Functional Limits for tasks assessed                      General Comments      Exercises        Assessment/Plan    PT Assessment Patient needs continued PT services  PT Diagnosis Difficulty walking;Generalized weakness;Acute pain   PT Problem List Decreased strength;Decreased activity tolerance;Decreased balance;Decreased mobility;Obesity;Decreased knowledge of use of DME;Pain  PT Treatment Interventions DME instruction;Gait training;Functional mobility training;Therapeutic activities;Therapeutic exercise;Patient/family education;Balance training   PT Goals (Current goals can be found in the Care Plan section) Acute Rehab PT Goals Patient Stated Goal: less pain.  PT Goal Formulation: With patient Time For Goal Achievement:  03/25/14 Potential to Achieve Goals: Good    Frequency Min 3X/week   Barriers to discharge        Co-evaluation               End of Session   Activity Tolerance: Patient limited by fatigue;Patient limited by pain Patient left: in chair;with call bell/phone within reach           Time: 5790-3833 PT Time Calculation (min): 19 min   Charges:   PT Evaluation $Initial PT Evaluation Tier I: 1 Procedure PT Treatments $Therapeutic Activity: 8-22 mins   PT G Codes:          Weston Anna, MPT Pager:  [phone]

## 2014-03-12 LAB — CBC
HCT: 27.7 % — ABNORMAL LOW (ref 36.0–46.0)
Hemoglobin: 8.6 g/dL — ABNORMAL LOW (ref 12.0–15.0)
MCH: 28.6 pg (ref 26.0–34.0)
MCHC: 31 g/dL (ref 30.0–36.0)
MCV: 92 fL (ref 78.0–100.0)
PLATELETS: 247 10*3/uL (ref 150–400)
RBC: 3.01 MIL/uL — AB (ref 3.87–5.11)
RDW: 14.7 % (ref 11.5–15.5)
WBC: 9.4 10*3/uL (ref 4.0–10.5)

## 2014-03-12 LAB — TYPE AND SCREEN
ABO/RH(D): A POS
Antibody Screen: NEGATIVE
Unit division: 0
Unit division: 0

## 2014-03-12 LAB — BASIC METABOLIC PANEL
BUN: 4 mg/dL — ABNORMAL LOW (ref 6–23)
CO2: 24 mEq/L (ref 19–32)
Calcium: 8.6 mg/dL (ref 8.4–10.5)
Chloride: 102 mEq/L (ref 96–112)
Creatinine, Ser: 0.65 mg/dL (ref 0.50–1.10)
Glucose, Bld: 117 mg/dL — ABNORMAL HIGH (ref 70–99)
Potassium: 3.4 mEq/L — ABNORMAL LOW (ref 3.7–5.3)
SODIUM: 139 meq/L (ref 137–147)

## 2014-03-12 MED ORDER — ENOXAPARIN SODIUM 80 MG/0.8ML ~~LOC~~ SOLN
75.0000 mg | Freq: Two times a day (BID) | SUBCUTANEOUS | Status: DC
  Administered 2014-03-12 – 2014-03-14 (×4): 75 mg via SUBCUTANEOUS
  Filled 2014-03-12 (×6): qty 0.8

## 2014-03-12 MED ORDER — SODIUM CHLORIDE 0.9 % IV SOLN
1020.0000 mg | Freq: Once | INTRAVENOUS | Status: AC
  Administered 2014-03-12: 1020 mg via INTRAVENOUS
  Filled 2014-03-12: qty 34

## 2014-03-12 NOTE — Progress Notes (Signed)
3 Days Post-Op  Subjective: She feels much better. She has passed gas and had a bm  Objective: Vital signs in last 24 hours: Temp:  [98.5 F (36.9 C)-98.9 F (37.2 C)] 98.5 F (36.9 C) (06/28 0509) Pulse Rate:  [88-91] 88 (06/28 0509) Resp:  [14-24] 19 (06/28 0850) BP: (139-142)/(74-88) 139/88 mmHg (06/28 0509) SpO2:  [98 %-100 %] 100 % (06/28 0850) Last BM Date: 03/12/14  Intake/Output from previous day: 06/27 0701 - 06/28 0700 In: 458 [P.O.:240; I.V.:218] Out: 1600 [Urine:1600] Intake/Output this shift: Total I/O In: 86.2 [I.V.:86.2] Out: -   Resp: clear to auscultation bilaterally Cardio: regular rate and rhythm GI: soft, mild tenderness. incisions ok  Lab Results:   Recent Labs  03/11/14 0415 03/12/14 0620  WBC 8.8 9.4  HGB 8.2* 8.6*  HCT 26.1* 27.7*  PLT 213 247   BMET  Recent Labs  03/11/14 0415 03/12/14 0620  NA 137 139  K 3.8 3.4*  CL 104 102  CO2 23 24  GLUCOSE 98 117*  BUN 4* 4*  CREATININE 0.78 0.65  CALCIUM 8.6 8.6   PT/INR No results found for this basename: LABPROT, INR,  in the last 72 hours ABG No results found for this basename: PHART, PCO2, PO2, HCO3,  in the last 72 hours  Studies/Results: No results found.  Anti-infectives: Anti-infectives   Start     Dose/Rate Route Frequency Ordered Stop   03/09/14 1800  cefoTEtan (CEFOTAN) 2 g in dextrose 5 % 50 mL IVPB     2 g 100 mL/hr over 30 Minutes Intravenous Every 12 hours 03/09/14 1458 03/09/14 1856   03/09/14 0600  cefoTEtan (CEFOTAN) 2 g in dextrose 5 % 50 mL IVPB     2 g 100 mL/hr over 30 Minutes Intravenous On call to O.R. 03/08/14 0845 03/09/14 0624   03/07/14 1330  ciprofloxacin (CIPRO) tablet 500 mg  Status:  Discontinued     500 mg Oral 2 times daily 03/07/14 1257 03/09/14 1458   03/06/14 1500  cefTRIAXone (ROCEPHIN) 1 g in dextrose 5 % 50 mL IVPB  Status:  Discontinued     1 g 100 mL/hr over 30 Minutes Intravenous Every 24 hours 03/06/14 1356 03/07/14 1257       Assessment/Plan: s/p Procedure(s): LAPAROSCOPIC ASSISTED PARTIAL COLECTOMY, splenic flexure mobilization (N/A) Advance diet ambulate  LOS: 9 days    TOTH III,PAUL S 03/12/2014

## 2014-03-13 LAB — TYPE AND SCREEN
ABO/RH(D): A POS
Antibody Screen: NEGATIVE
UNIT DIVISION: 0
UNIT DIVISION: 0
UNIT DIVISION: 0
Unit division: 0
Unit division: 0
Unit division: 0
Unit division: 0
Unit division: 0

## 2014-03-13 LAB — HEMOGLOBIN: HEMOGLOBIN: 8.4 g/dL — AB (ref 12.0–15.0)

## 2014-03-13 MED ORDER — OXYCODONE-ACETAMINOPHEN 5-325 MG PO TABS: 1.0000 | ORAL_TABLET | ORAL | Status: DC | PRN

## 2014-03-13 MED ORDER — OXYCODONE-ACETAMINOPHEN 5-325 MG PO TABS
1.0000 | ORAL_TABLET | ORAL | Status: DC | PRN
  Administered 2014-03-13 (×2): 1 via ORAL
  Administered 2014-03-14: 2 via ORAL
  Administered 2014-03-14: 1 via ORAL
  Filled 2014-03-13: qty 1
  Filled 2014-03-13: qty 2
  Filled 2014-03-13: qty 1
  Filled 2014-03-13: qty 2

## 2014-03-13 NOTE — Discharge Summary (Addendum)
Physician Discharge Summary  Patient ID: Veronica Reyes MRN: [id number] DOB/AGE: Jan 23, 1956 58 y.o.  Admit date: 03/03/2014 Discharge date: 03/14/14  Patient Care Team: Claris Gladden. Ouida Sills, [place] as PCP - General (Nurse Practitioner) Leighton Ruff, MD as Consulting Physician (General Surgery) Beryle Beams, MD as Consulting Physician (Gastroenterology) Concha Norway, MD as Consulting Physician (Internal Medicine) Emily Filbert, MD as Consulting Physician (Obstetrics and Gynecology)  Admission Diagnoses: acute blood loss anemia, colon cancer  Discharge Diagnoses:  Principal Problem:   Colon cancer Active Problems:   Pulmonary embolism   Breast cancer   Essential hypertension, benign   History of BRBPR (bright red blood per rectum)   Symptomatic anemia   Menorrhagia with irregular cycle   Acute blood loss anemia   Fibroids   E. coli UTI (urinary tract infection)   Discharged Condition: good  POST-OPERATIVE DIAGNOSIS: COLON CANCER   PROCEDURE: LAPAROSCOPIC ASSISTED PARTIAL COLECTOMY, splenic flexure mobilization  SURGEON: Surgeon(s):  Leighton Ruff, MD  Adin Hector, MD   Hospital Course: The patient was admitted after she was found to be severely anemic on her pre op labs.  She was using Alen Blew for anticoagulation for a PE found on work up for her colon cancer.  She was given 2 units of pRBC's and her anticoagulation was stopped.  Her Hgb stabilized.  She underwent endometrial biopsy with Dr Hulan Fray for her vaginal bleeding.  There was no signs of malignancy.  Dr Hulan Fray recommended Megace if she has bleeding again.  Her admission CT showed a renal lesion, which was further imaged using MRI.    She underwent her colon surgery as scheduled.   Postoperatively, the patient gradually mobilized in the hallways and advanced to a solid diet.  Pain and other symptoms  were treated aggressively.  She received 1 more unit of pRBC's intra-operatively.  She did well postoperatively.  Her  foley was removed on POD 2.  Her diet was advanced slowly.  Her bowel function returned by POD 3 and she was started on PO narcotics and therapeutic Lovenox for her PE.  Her Hgb levels have been stable post operatively.     By the time of discharge, the patient was walking well the hallways, eating food well, having flatus.  Pain was well-controlled on an oral regimen.  Based on meeting discharge criteria and continuing to recover, I felt it was safe for the patient to be discharged from the hospital with close followup.  Instructions were discussed in detail.  They are written as well.  Consults: Oncology: Dr Juliann Mule   GYN: Dr Hulan Fray  Significant Diagnostic Studies: labs: cbc, chemistry and radiology: MRI: Renal lesion: indeterminate imaging characteristics, but is favored to be benign, and is currently classified as a Bosniak class IIF lesion. Repeat MRI with and without IV gadolinium in 6 months is recommended  and CT scan: renal lesion, enlarged uterine stripe  US Transvaginal Non-ob  03/04/2014   CLINICAL DATA:  Vaginal bleeding  EXAM: TRANSABDOMINAL AND TRANSVAGINAL ULTRASOUND OF PELVIS  TECHNIQUE: Both transabdominal and transvaginal ultrasound examinations of the pelvis were performed. Transabdominal technique was performed for global imaging of the pelvis including uterus, ovaries, adnexal regions, and pelvic cul-de-sac. It was necessary to proceed with endovaginal exam following the transabdominal exam to visualize the endometrium.  COMPARISON:  None  FINDINGS: Uterus  Measurements: 9.1 x 7.2 x 7.1 cm. Uterus is heterogeneous, with 3 mildly hypoechoic mass is identified consistent fibroids the largest measuring 2.6 cm. In the fundus there is  an intramural fibroid measuring 2.2 cm. The 2.6 cm fibroid is seen in the subendometrial position in the anterior body of the uterus, with an adjacent 1.2 cm fibroid. The uterus is retroverted.  Endometrium  Thickness: 14 mm.  Heterogeneous echotexture  Right  ovary  Measurements: 30 x 20 x 18 mm. Normal appearance/no adnexal mass.  Left ovary  Not visualized  Other findings  No free fluid.  IMPRESSION: 1. 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 2. Heterogeneous prominent endometrium which could be due to hyperplasia, polyp, or carcinoma.   Electronically Signed   By: Skipper Cliche M.D.   On: 03/04/2014 09:36   US Pelvis Complete  03/04/2014   CLINICAL DATA:  Vaginal bleeding  EXAM: TRANSABDOMINAL AND TRANSVAGINAL ULTRASOUND OF PELVIS  TECHNIQUE: Both transabdominal and transvaginal ultrasound examinations of the pelvis were performed. Transabdominal technique was performed for global imaging of the pelvis including uterus, ovaries, adnexal regions, and pelvic cul-de-sac. It was necessary to proceed with endovaginal exam following the transabdominal exam to visualize the endometrium.  COMPARISON:  None  FINDINGS: Uterus  Measurements: 9.1 x 7.2 x 7.1 cm. Uterus is heterogeneous, with 3 mildly hypoechoic mass is identified consistent fibroids the largest measuring 2.6 cm. In the fundus there is an intramural fibroid measuring 2.2 cm. The 2.6 cm fibroid is seen in the subendometrial position in the anterior body of the uterus, with an adjacent 1.2 cm fibroid. The uterus is retroverted.  Endometrium  Thickness: 14 mm.  Heterogeneous echotexture  Right ovary  Measurements: 30 x 20 x 18 mm. Normal appearance/no adnexal mass.  Left ovary  Not visualized  Other findings  No free fluid.  IMPRESSION: 1. 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 2. Heterogeneous prominent endometrium which could be due to hyperplasia, polyp, or carcinoma.   Electronically Signed   By: Skipper Cliche M.D.   On: 03/04/2014 09:36   Ct Abdomen Pelvis W Contrast  03/04/2014   CLINICAL DATA:  Generalized weakness, tiredness, tachycardia, anemia, bladder  and bowels, menorrhagia, history breast cancer post radiation therapy, colon cancer pre colectomy, hypertension  EXAM: CT ABDOMEN AND PELVIS WITH CONTRAST  TECHNIQUE: Multidetector CT imaging of the abdomen and pelvis was performed using the standard protocol following bolus administration of intravenous contrast. Sagittal and coronal MPR images reconstructed from axial data set.  CONTRAST:  43m OMNIPAQUE IOHEXOL 300 MG/ML SOLN, 1058mOMNIPAQUE IOHEXOL 300 MG/ML SOLN  COMPARISON:  01/06/2014  FINDINGS: Lung bases clear.  Gallbladder surgically absent.  BILATERAL renal cysts, largest at RIGHT inferior pole questionably demonstrating mural nodularity, overall lesion 4.1 x 4.1 x 3.6 cm.  4 mm low-attenuation focus anteriorly in medial segment LEFT lobe liver image 21 unchanged.  Question new 6 mm low-attenuation nodule medial segment LEFT lobe liver image 21.  Remainder of liver, spleen, pancreas, kidneys, and adrenal glands normal appearance.  Central low attenuation within uterus could be related to phase of menses.  Unremarkable bladder, ureters, and adnexae.  Normal appendix.  Mass at distal descending colon 3.8 x 4.1 cm image 59, approximately 5.8 cm length.  9 mm short axis lymph node in mesentery medial to the descending colon a previously 10 mm.  Stomach and bowel loops otherwise unremarkable.  No mass, adenopathy, free fluid or free air.  Mild scattered atherosclerotic calcification.  No acute osseous findings.  IMPRESSION: 3.8 x  4.1 x 5.8 cm diameter distal descending colon mass compatible with a primary colonic malignancy.  9 mm short axis lymph node in the adjacent colonic mesenteric.  BILATERAL renal cysts, largest of which at the RIGHT kidney shows questionable area of mural nodularity; followup nonemergent characterization of this lesion by MR imaging with and without contrast recommended to exclude cystic renal neoplasm.  Two nonspecific low-attenuation foci within liver.   Electronically Signed   By:  Lavonia Dana M.D.   On: 03/04/2014 11:12   Dg Chest Port 1 View  03/03/2014   CLINICAL DATA:  Shortness of breath with exertion  EXAM: PORTABLE CHEST - 1 VIEW  COMPARISON:  None.  FINDINGS: Moderate cardiac enlargement similar to prior study. Mild vascular congestion with no evidence of edema or consolidation.  IMPRESSION: Cardiac enlargement with minimal vascular congestion and no pulmonary edema   Electronically Signed   By: Skipper Cliche M.D.   On: 03/03/2014 19:07   Results for orders placed during the hospital encounter of 03/03/14 (from the past 72 hour(s))  BASIC METABOLIC PANEL     Status: Abnormal   Collection Time    03/12/14  6:20 AM      Result Value Ref Range   Sodium 139  137 - 147 mEq/L   Potassium 3.4 (*) 3.7 - 5.3 mEq/L   Chloride 102  96 - 112 mEq/L   CO2 24  19 - 32 mEq/L   Glucose, Bld 117 (*) 70 - 99 mg/dL   BUN 4 (*) 6 - 23 mg/dL   Creatinine, Ser 0.65  0.50 - 1.10 mg/dL   Calcium 8.6  8.4 - 10.5 mg/dL   GFR calc non Af Amer >90  >90 mL/min   GFR calc Af Amer >90  >90 mL/min   Comment: (NOTE)     The eGFR has been calculated using the CKD EPI equation.     This calculation has not been validated in all clinical situations.     eGFR's persistently <90 mL/min signify possible Chronic Kidney     Disease.  CBC     Status: Abnormal   Collection Time    03/12/14  6:20 AM      Result Value Ref Range   WBC 9.4  4.0 - 10.5 K/uL   RBC 3.01 (*) 3.87 - 5.11 MIL/uL   Hemoglobin 8.6 (*) 12.0 - 15.0 g/dL   HCT 27.7 (*) 36.0 - 46.0 %   MCV 92.0  78.0 - 100.0 fL   MCH 28.6  26.0 - 34.0 pg   MCHC 31.0  30.0 - 36.0 g/dL   RDW 14.7  11.5 - 15.5 %   Platelets 247  150 - 400 K/uL  HEMOGLOBIN     Status: Abnormal   Collection Time    03/13/14  4:38 AM      Result Value Ref Range   Hemoglobin 8.4 (*) 12.0 - 15.0 g/dL    Treatments: IV hydration, anticoagulation: LMW heparin, therapies: PT, surgery: lap L colectomy and endometrial biopsy  Discharge Exam: Blood pressure  135/79, pulse 88, temperature 98.3 F (36.8 C), temperature source Oral, resp. rate 16, height '5\' 6"'  (1.676 m), weight 319 lb 3.6 oz (144.8 kg), last menstrual period 01/23/2014, SpO2 100.00%. General appearance: alert and cooperative GI: normal findings: soft, non-tender Incision/Wound: clean, intact, no signs of infection  Disposition: 01-Home or Self Care     Medication List         diphenhydramine-acetaminophen 25-500 MG Tabs  Commonly  known as:  TYLENOL PM  Take 1 tablet by mouth at bedtime as needed (Sleep).     ferrous sulfate 325 (65 FE) MG tablet  Take 325 mg by mouth every morning.     hydrochlorothiazide 25 MG tablet  Commonly known as:  HYDRODIURIL  Take 25 mg by mouth every morning.     metoprolol 50 MG tablet  Commonly known as:  LOPRESSOR  Take 50 mg by mouth 2 (two) times daily.     multivitamin with minerals Tabs tablet  Take 1 tablet by mouth daily.     oxyCODONE-acetaminophen 5-325 MG per tablet  Commonly known as:  PERCOCET/ROXICET  Take 1-2 tablets by mouth every 4 (four) hours as needed for severe pain.     Rivaroxaban 15 MG Tabs tablet  Commonly known as:  XARELTO  Take 15 mg by mouth 2 (two) times daily. To stop x 24 hours prior to surgery per Dr. Marcello Moores           Follow-up Information   Follow up with CHISM, DAVID, MD. Schedule an appointment as soon as possible for a visit in 3 weeks.   Specialty:  Internal Medicine   Contact information:   [place] [address] [phone]       Follow up with Rosario Adie., MD In 2 weeks.   Specialty:  General Surgery   Contact information:   [address] [place] [address] [phone]       Follow up with Emily Filbert., MD. (As needed, If symptoms worsen)    Specialty:  Obstetrics and Gynecology   Contact information:   [place] [address] [phone]       Signed: Rosario Adie 1/54/0086, 7:61 AM

## 2014-03-13 NOTE — Discharge Instructions (Addendum)
ABDOMINAL SURGERY: POST OP INSTRUCTIONS ° °1. DIET: Follow a light bland diet the first 24 hours after arrival home, such as soup, liquids, crackers, etc.  Be sure to include lots of fluids daily.  Avoid fast food or heavy meals as your are more likely to get nauseated.  Eat a low fat the next few days after surgery.   °2. Take your usually prescribed home medications unless otherwise directed. °3. PAIN CONTROL: °a. Pain is best controlled by a usual combination of three different methods TOGETHER: °i. Ice/Heat °ii. Over the counter pain medication °iii. Prescription pain medication °b. Most patients will experience some swelling and bruising around the incisions.  Ice packs or heating pads (30-60 minutes up to 6 times a day) will help. Use ice for the first few days to help decrease swelling and bruising, then switch to heat to help relax tight/sore spots and speed recovery.  Some people prefer to use ice alone, heat alone, alternating between ice & heat.  Experiment to what works for you.  Swelling and bruising can take several weeks to resolve.   °c. It is helpful to take an over-the-counter pain medication regularly for the first few weeks.  Choose one of the following that works best for you: °i. Naproxen (Aleve, etc)  Two 220mg tabs twice a day °ii. Ibuprofen (Advil, etc) Three 200mg tabs four times a day (every meal & bedtime) °iii. Acetaminophen (Tylenol, etc) 500-650mg four times a day (every meal & bedtime) °d. A  prescription for pain medication (such as oxycodone, hydrocodone, etc) should be given to you upon discharge.  Take your pain medication as prescribed.  °i. If you are having problems/concerns with the prescription medicine (does not control pain, nausea, vomiting, rash, itching, etc), please call us [phone] to see if we need to switch you to a different pain medicine that will work better for you and/or control your side effect better. °ii. If you need a refill on your pain medication,  please contact your pharmacy.  They will contact our office to request authorization. Prescriptions will not be filled after 5 pm or on week-ends. °4. Avoid getting constipated.  Between the surgery and the pain medications, it is common to experience some constipation.  Increasing fluid intake and taking a fiber supplement (such as Metamucil, Citrucel, FiberCon, MiraLax, etc) 1-2 times a day regularly will usually help prevent this problem from occurring.  A mild laxative (prune juice, Milk of Magnesia, MiraLax, etc) should be taken according to package directions if there are no bowel movements after 48 hours.   °5. Watch out for diarrhea.  If you have many loose bowel movements, simplify your diet to bland foods & liquids for a few days.  Stop any stool softeners and decrease your fiber supplement.  Switching to mild anti-diarrheal medications (Kayopectate, Pepto Bismol) can help.  If this worsens or does not improve, please call us. °6. Wash / shower every day.  You may shower over the incision / wound.  Avoid baths until the skin is fully healed.  Continue to shower over incision(s) after the dressing is off. °7. Remove your waterproof bandages 5 days after surgery.  You may leave the incision open to air.  You may replace a dressing/Band-Aid to cover the incision for comfort if you wish. °8. ACTIVITIES as tolerated:   °a. You may resume regular (light) daily activities beginning the next day--such as daily self-care, walking, climbing stairs--gradually increasing activities as tolerated.  If you can   walk 30 minutes without difficulty, it is safe to try more intense activity such as jogging, treadmill, bicycling, low-impact aerobics, swimming, etc. b. Save the most intensive and strenuous activity for last such as sit-ups, heavy lifting, contact sports, etc  Refrain from any heavy lifting or straining until you are off narcotics for pain control.   c. DO NOT PUSH THROUGH PAIN.  Let pain be your guide: If it  hurts to do something, don't do it.  Pain is your body warning you to avoid that activity for another week until the pain goes down. d. You may drive when you are no longer taking prescription pain medication, you can comfortably wear a seatbelt, and you can safely maneuver your car and apply brakes. e. Dennis Bast may have sexual intercourse when it is comfortable.  9. FOLLOW UP in our office a. Please call [facility] at [phone] to set up an appointment to see your surgeon in the office for a follow-up appointment approximately 1-2 weeks after your surgery. b. Make sure that you call for this appointment the day you arrive home to insure a convenient appointment time. 10. IF YOU HAVE DISABILITY OR FAMILY LEAVE FORMS, BRING THEM TO THE OFFICE FOR PROCESSING.  DO NOT GIVE THEM TO YOUR DOCTOR.   WHEN TO CALL us [phone]: 1. Poor pain control 2. Reactions / problems with new medications (rash/itching, nausea, etc)  3. Fever over 101.5 F (38.5 C) 4. Inability to urinate 5. Nausea and/or vomiting 6. Worsening swelling or bruising 7. Continued bleeding from incision. 8. Increased pain, redness, or drainage from the incision  The clinic staff is available to answer your questions during regular business hours (8:30am-5pm).  Please dont hesitate to call and ask to speak to one of our nurses for clinical concerns.   A surgeon from [facility] is always on call at the hospitals   If you have a medical emergency, go to the nearest emergency room or call 911.    [facility], [place], [place], [place], [place]  [id number] ? MAIN: [phone] ? TOLL FREE: [phone] ? FAX [phone] [email or web address]  Pulmonary Embolus A pulmonary (lung) embolus (PE) is a blood clot that has traveled from another place in the body to the lung. Most clots come from deep veins in the legs or pelvis. PE is a dangerous and potentially life-threatening  condition that can be treated if identified. CAUSES Blood clots form in a vein for different reasons. Usually several things cause blood clots. They include:  The flow of blood slows down.  The inside of the vein is damaged in some way.  The person has a condition that makes the blood clot more easily. These conditions may include:  Older age (especially over 57 years old).  Having a history of blood clots.  Having major or lengthy surgery. Hip surgery is particularly high-risk.  Breaking a hip or leg.  Sitting or lying still for a long time.  Cancer or cancer treatment.  Having a long, thin tube (catheter) placed inside a vein during a medical procedure.  Being overweight (obese).  Pregnancy and childbirth.  Medicines with estrogen.  Smoking.  Other circulation or heart problems. SYMPTOMS  The symptoms of a PE usually start suddenly and include:  Shortness of breath.  Coughing.  Coughing up blood or blood-tinged mucus (phlegm).  Chest pain. Pain is often worse with deep breaths.  Rapid heartbeat. DIAGNOSIS  If a PE is suspected,  your caregiver will take a medical history and carry out a physical exam. Your caregiver will check for the risk factors listed above. Tests that also may be required include:  Blood tests, including studies of the clotting properties of your blood.  Imaging tests. Ultrasound, CT, MRI, and other tests can all be used to see if you have clots in your legs or lungs. If you have a clot in your legs and have breathing or chest problems, your caregiver may conclude that you have a clot in your lungs. Further lung tests may not be needed.  Electrocardiography can look for heart strain from blood clots in the lungs. PREVENTION   Exercise the legs regularly. Take a brisk 30 minute walk every day.  Maintain a weight that is appropriate for your height.  Avoid sitting or lying in bed for long periods of time without moving your  legs.  Women, particularly those over the age of 76, should consider the risks and benefits of taking estrogen medicines, including birth control pills.  Do not smoke, especially if you take estrogen medicines.  Long-distance travel can increase your risk. You should exercise your legs by walking or pumping the muscles every hour.  In hospital prevention:  Your caregiver will assess your need for preventive PE care (prophylaxis) when you are admitted to the hospital. If you are having surgery, your surgeon will assess you the day of or day after surgery.  Prevention may include medical and nonmedical measures. TREATMENT   The most common treatment for a PE is blood thinning (anticoagulant) medicine, which reduces the blood's tendency to clot. Anticoagulants can stop new blood clots from forming and old ones from growing. They cannot dissolve existing clots. Your body does this by itself over time. Anticoagulants can be given by mouth, by intravenous (IV) access, or by injection. Your caregiver will determine the best program for you.  Less commonly, clot-dissolving drugs (thrombolytics) are used to dissolve a PE. They carry a high risk of bleeding, so they are used mainly in severe cases.  Very rarely, a blood clot in the leg needs to be removed surgically.  If you are unable to take anticoagulants, your caregiver may arrange for you to have a filter placed in a main vein in your abdomen. This filter prevents clots from traveling to your lungs. HOME CARE INSTRUCTIONS   Take all medicines prescribed by your caregiver. Follow the directions carefully.  Warfarin. Most people will continue taking warfarin after hospital discharge. Your caregiver will advise you on the length of treatment (usually 3-6 months, sometimes lifelong).  Too much and too little warfarin are both dangerous. Too much warfarin increases the risk of bleeding. Too little warfarin continues to allow the risk for blood  clots. While taking warfarin, you will need to have regular blood tests to measure your blood clotting time. These blood tests usually include both the prothrombin time (PT) and International Normalized Ratio (INR) tests. The PT and INR results allow your caregiver to adjust your dose of warfarin. The dose can change for many reasons. It is critically important that you take warfarin exactly as prescribed, and that you have your PT and INR levels drawn exactly as directed.  Many foods, especially foods high in vitamin K can interfere with warfarin and affect the PT and INR results. Foods high in vitamin K include spinach, kale, broccoli, cabbage, collard and turnip greens, brussels sprouts, peas, cauliflower, seaweed, and parsley as well as beef and pork liver, green  tea, and soybean oil. You should eat a consistent amount of foods high in vitamin K. Avoid major changes in your diet, or notify your caregiver before changing your diet. Arrange a visit with a dietitian to answer your questions.  Many medicines can interfere with warfarin and affect the PT and INR results. You must tell your caregiver about any and all medicines you take, this includes all vitamins and supplements. Be especially cautious with aspirin and anti-inflammatory medicines. Ask your caregiver before taking these. Do not take or discontinue any prescribed or over-the-counter medicine except on the advice of your caregiver or pharmacist.  Warfarin can have side effects, such as excessive bruising or bleeding. You will need to hold pressure over cuts for longer than usual.  Alcohol can change the body's ability to handle warfarin. It is best to avoid alcoholic drinks or consume only very small amounts while taking warfarin. Notify your caregiver if you change your alcohol intake.  Notify your dentist or other caregivers before procedures.  Avoid contact sports.  Wear a medical alert bracelet or carry a medical alert card.  Ask  your caregiver how soon you can go back to normal activities. Not being active can lead to new clots. Ask for a list of what you should and should not do.  Compression stockings. These are tight elastic stockings that apply pressure to the lower legs. This can help keep the blood in the legs from clotting. You may need to wear compressions stockings at home to help prevent clots.  Smoking. If you smoke, quit. Ask your caregiver for help with quitting smoking.  Learn as much as you can about PE. Educating yourself can help prevent PE from reoccurring. SEEK MEDICAL CARE IF:   You notice a rapid heartbeat.  You feel weaker or more tired than usual.  You feel faint.  You notice increased bruising.  Your symptoms are not getting better in the time expected.  You are having side effects of medicine. SEEK IMMEDIATE MEDICAL CARE IF:   You have chest pain.  You have trouble breathing.  You have new or increased swelling or pain in one leg.  You cough up blood.  You notice blood in vomit, in a bowel movement, or in urine.  You have an oral temperature above 102 F (38.9 C), not controlled by medicine. You may have another PE. A blood clot in the lungs is a medical emergency. Call your local emergency services (911 in [place] to get to the nearest hospital or clinic. Do not drive yourself. MAKE SURE YOU:   Understand these instructions.  Will watch your condition.  Will get help right away if you are not doing well or get worse. Document Released: 08/29/2000 Document Revised: 03/02/2012 Document Reviewed: 03/05/2009 [facility] Patient Information 2015 [place], [place]. This information is not intended to replace advice given to you by your health care provider. Make sure you discuss any questions you have with your health care provider.  Menorrhagia Menorrhagia is the medical term for when your menstrual periods are heavy or last longer than usual. With menorrhagia, every period you  have may cause enough blood loss and cramping that you are unable to maintain your usual activities. CAUSES  In some cases, the cause of heavy periods is unknown, but a number of conditions may cause menorrhagia. Common causes include:  A problem with the hormone-producing thyroid gland (hypothyroid).  Noncancerous growths in the uterus (polyps or fibroids).  An imbalance of the estrogen and progesterone  hormones.  One of your ovaries not releasing an egg during one or more months.  Side effects of having an intrauterine device (IUD).  Side effects of some medicines, such as anti-inflammatory medicines or blood thinners.  A bleeding disorder that stops your blood from clotting normally. SIGNS AND SYMPTOMS  During a normal period, bleeding lasts between 4 and 8 days. Signs that your periods are too heavy include:  You routinely have to change your pad or tampon every 1 or 2 hours because it is completely soaked.  You pass blood clots larger than 1 inch (2.5 cm) in size.  You have bleeding for more than 7 days.  You need to use pads and tampons at the same time because of heavy bleeding.  You need to wake up to change your pads or tampons during the night.  You have symptoms of anemia, such as tiredness, fatigue, or shortness of breath. DIAGNOSIS  Your health care provider will perform a physical exam and ask you questions about your symptoms and menstrual history. Other tests may be ordered based on what the health care provider finds during the exam. These tests can include:  Blood tests. Blood tests are used to check if you are pregnant or have hormonal changes, a bleeding or thyroid disorder, low iron levels (anemia), or other problems.  Endometrial biopsy. Your health care provider takes a sample of tissue from the inside of your uterus to be examined under a microscope.  Pelvic ultrasound. This test uses sound waves to make a picture of your uterus, ovaries, and vagina. The  pictures can show if you have fibroids or other growths.  Hysteroscopy. For this test, your health care provider will use a small telescope to look inside your uterus. Based on the results of your initial tests, your health care provider may recommend further testing. TREATMENT  Treatment may not be needed. If it is needed, your health care provider may recommend treatment with one or more medicines first. If these do not reduce bleeding enough, a surgical treatment might be an option. The best treatment for you will depend on:   Whether you need to prevent pregnancy.  Your desire to have children in the future.  The cause and severity of your bleeding.  Your opinion and personal preference.  Medicines for menorrhagia may include:  Birth control methods that use hormones. These include the pill, skin patch, vaginal ring, shots that you get every 3 months, hormonal IUD, and implant. These treatments reduce bleeding during your menstrual period.  Medicines that thicken blood and slow bleeding.  Medicines that reduce swelling, such as ibuprofen.  Medicines that contain a synthetic hormone called progestin.   Medicines that make the ovaries stop working for a short time.  You may need surgical treatment for menorrhagia if the medicines are unsuccessful. Treatment options include:  Dilation and curettage (D&C). In this procedure, your health care provider opens (dilates) your cervix and then scrapes or suctions tissue from the lining of your uterus to reduce menstrual bleeding.  Operative hysteroscopy. This procedure uses a tiny tube with a light (hysteroscope) to view your uterine cavity and can help in the surgical removal of a polyp that may be causing heavy periods.  Endometrial ablation. Through various techniques, your health care provider permanently destroys the entire lining of your uterus (endometrium). After endometrial ablation, most women have little or no menstrual flow.  Endometrial ablation reduces your ability to become pregnant.  Endometrial resection. This surgical procedure uses  an electrosurgical wire loop to remove the lining of the uterus. This procedure also reduces your ability to become pregnant.  Hysterectomy. Surgical removal of the uterus and cervix is a permanent procedure that stops menstrual periods. Pregnancy is not possible after a hysterectomy. This procedure requires anesthesia and hospitalization. HOME CARE INSTRUCTIONS   Only take over-the-counter or prescription medicines as directed by your health care provider. Take prescribed medicines exactly as directed. Do not change or switch medicines without consulting your health care provider.  Take any prescribed iron pills exactly as directed by your health care provider. Long-term heavy bleeding may result in low iron levels. Iron pills help replace the iron your body lost from heavy bleeding. Iron may cause constipation. If this becomes a problem, increase the bran, fruits, and roughage in your diet.  Do not take aspirin or medicines that contain aspirin 1 week before or during your menstrual period. Aspirin may make the bleeding worse.  If you need to change your sanitary pad or tampon more than once every 2 hours, stay in bed and rest as much as possible until the bleeding stops.  Eat well-balanced meals. Eat foods high in iron. Examples are leafy green vegetables, meat, liver, eggs, and whole grain breads and cereals. Do not try to lose weight until the abnormal bleeding has stopped and your blood iron level is back to normal. SEEK MEDICAL CARE IF:   You soak through a pad or tampon every 1 or 2 hours, and this happens every time you have a period.  You need to use pads and tampons at the same time because you are bleeding so much.  You need to change your pad or tampon during the night.  You have a period that lasts for more than 8 days.  You pass clots bigger than 1 inch  wide.  You have irregular periods that happen more or less often than once a month.  You feel dizzy or faint.  You feel very weak or tired.  You feel short of breath or feel your heart is beating too fast when you exercise.  You have nausea and vomiting or diarrhea while you are taking your medicine.  You have any problems that may be related to the medicine you are taking. SEEK IMMEDIATE MEDICAL CARE IF:   You soak through 4 or more pads or tampons in 2 hours.  You have any bleeding while you are pregnant. MAKE SURE YOU:   Understand these instructions.  Will watch your condition.  Will get help right away if you are not doing well or get worse. Document Released: 09/01/2005 Document Revised: 09/06/2013 Document Reviewed: 02/20/2013 [facility] Patient Information 2015 [place], [place]. This information is not intended to replace advice given to you by your health care provider. Make sure you discuss any questions you have with your health care provider.  GETTING TO GOOD BOWEL HEALTH. Irregular bowel habits such as constipation and diarrhea can lead to many problems over time.  Having one soft bowel movement a day is the most important way to prevent further problems.  The anorectal canal is designed to handle stretching and feces to safely manage our ability to get rid of solid waste (feces, poop, stool) out of our body.  BUT, hard constipated stools can act like ripping concrete bricks and diarrhea can be a burning fire to this very sensitive area of our body, causing inflamed hemorrhoids, anal fissures, increasing risk is perirectal abscesses, abdominal pain/bloating, an making irritable bowel worse.  The goal: ONE SOFT BOWEL MOVEMENT A DAY!  To have soft, regular bowel movements:    Drink at least 8 tall glasses of water a day.     Take plenty of fiber.  Fiber is the undigested part of plant food that passes into the colon, acting s natures broom to encourage bowel motility and  movement.  Fiber can absorb and hold large amounts of water. This results in a larger, bulkier stool, which is soft and easier to pass. Work gradually over several weeks up to 6 servings a day of fiber (25g a day even more if needed) in the form of: o Vegetables -- Root (potatoes, carrots, turnips), leafy green (lettuce, salad greens, celery, spinach), or cooked high residue (cabbage, broccoli, etc) o Fruit -- Fresh (unpeeled skin & pulp), Dried (prunes, apricots, cherries, etc ),  or stewed ( applesauce)  o Whole grain breads, pasta, etc (whole wheat)  o Bran cereals    Bulking Agents -- This type of water-retaining fiber generally is easily obtained each day by one of the following:  o Psyllium bran -- The psyllium plant is remarkable because its ground seeds can retain so much water. This product is available as Metamucil, Konsyl, Effersyllium, Per Diem Fiber, or the less expensive generic preparation in drug and health food stores. Although labeled a laxative, it really is not a laxative.  o Methylcellulose -- This is another fiber derived from wood which also retains water. It is available as Citrucel. o Polyethylene Glycol - and artificial fiber commonly called Miralax or Glycolax.  It is helpful for people with gassy or bloated feelings with regular fiber o Flax Seed - a less gassy fiber than psyllium   No reading or other relaxing activity while on the toilet. If bowel movements take longer than 5 minutes, you are too constipated   AVOID CONSTIPATION.  High fiber and water intake usually takes care of this.  Sometimes a laxative is needed to stimulate more frequent bowel movements, but    Laxatives are not a good long-term solution as it can wear the colon out. o Osmotics (Milk of Magnesia, Fleets phosphosoda, Magnesium citrate, MiraLax, GoLytely) are safer than  o Stimulants (Senokot, Castor Oil, Dulcolax, Ex Lax)    o Do not take laxatives for more than 7days in a row.    IF SEVERELY  CONSTIPATED, try a Bowel Retraining Program: o Do not use laxatives.  o Eat a diet high in roughage, such as bran cereals and leafy vegetables.  o Drink six (6) ounces of prune or apricot juice each morning.  o Eat two (2) large servings of stewed fruit each day.  o Take one (1) heaping tablespoon of a psyllium-based bulking agent twice a day. Use sugar-free sweetener when possible to avoid excessive calories.  o Eat a normal breakfast.  o Set aside 15 minutes after breakfast to sit on the toilet, but do not strain to have a bowel movement.  o If you do not have a bowel movement by the third day, use an enema and repeat the above steps.    Controlling diarrhea o Switch to liquids and simpler foods for a few days to avoid stressing your intestines further. o Avoid dairy products (especially milk & ice cream) for a short time.  The intestines often can lose the ability to digest lactose when stressed. o Avoid foods that cause gassiness or bloating.  Typical foods include beans and other legumes, cabbage, broccoli, and dairy foods.  Every person has some sensitivity to other foods, so listen to our body and avoid those foods that trigger problems for you. o Adding fiber (Citrucel, Metamucil, psyllium, Miralax) gradually can help thicken stools by absorbing excess fluid and retrain the intestines to act more normally.  Slowly increase the dose over a few weeks.  Too much fiber too soon can backfire and cause cramping & bloating. o Probiotics (such as active yogurt, Align, etc) may help repopulate the intestines and colon with normal bacteria and calm down a sensitive digestive tract.  Most studies show it to be of mild help, though, and such products can be costly. o Medicines:   Bismuth subsalicylate (ex. Kayopectate, Pepto Bismol) every 30 minutes for up to 6 doses can help control diarrhea.  Avoid if pregnant.   Loperamide (Immodium) can slow down diarrhea.  Start with two tablets (4mg  total) first  and then try one tablet every 6 hours.  Avoid if you are having fevers or severe pain.  If you are not better or start feeling worse, stop all medicines and call your doctor for advice o Call your doctor if you are getting worse or not better.  Sometimes further testing (cultures, endoscopy, X-ray studies, bloodwork, etc) may be needed to help diagnose and treat the cause of the diarrhea.   Colorectal Cancer Colorectal cancer is an abnormal growth of tissue (tumor) in the colon or rectum that is cancerous (malignant). Unlike noncancerous (benign) tumors, malignant tumors can spread to other parts of your body. The colon is the large bowel or large intestine. The rectum is the last several inches of the colon.  RISK FACTORS The exact cause of colorectal cancer is unknown. However, the following factors may increase your chances of getting colorectal cancer:   Age older than 59 years.   Abnormal growths (polyps) on the inner wall of the colon or rectum.   Diabetes.   African American race.   Family history of hereditary nonpolyposis colorectal cancer. This condition is caused by changes in the genes that are responsible for repairing mismatched DNA.   Personal history of cancer. A person who has already had colorectal cancer may develop it a second time. Also, women with a history of ovarian, uterine, or breast cancer are at a somewhat higher risk of developing colorectal cancer.  Certain hereditary conditions.  Eating a diet that is high in fat (especially animal fat) and low in fiber, fruits, and vegetables.  Sedentary lifestyle.  Inflammatory bowel disease, including ulcerative colitis and Crohn's disease.   Smoking.   Excessive alcohol use.  SYMPTOMS Early colorectal cancer often does not cause symptoms. As the cancer grows, symptoms may include:   Changes in bowel habits.  Diarrhea.   Constipation.   Feeling like the bowel does not empty completely after a bowel  movement.   Blood in the stool.   Stools that are narrower than usual.   Abdominal discomfort, pain, bloating, fullness, or cramps.  Frequent gas pain.   Unexplained weight loss.   Constant tiredness.   Nausea and vomiting.  DIAGNOSIS  Your health care provider will ask about your medical history. He or she may also perform a number of procedures, such as:   A physical exam.  A digital rectal exam.  A fecal occult blood test.  A barium enema.  Blood tests.   X-rays.   Imaging tests, such as CT scans or MRIs.   Taking a tissue sample (biopsy) from your colon or rectum to  look for cancer cells.   A sigmoidoscopy to view the inside of the last part of your colon.   A colonoscopy to view the inside of your entire colon.   An endorectal ultrasound to see how deep a rectal tumor has grown and whether the cancer has spread to lymph nodes or other nearby tissues.  Your cancer will be staged to determine its severity and extent. Staging is a careful attempt to find out the size of the tumor, whether the cancer has spread, and if so, to what parts of the body. You may need to have more tests to determine the stage of your cancer. The test results will help determine what treatment plan is best for you.   Stage 0--The cancer is found only in the innermost lining of the colon or rectum.   Stage I--The cancer has grown into the inner wall of the colon or rectum. The cancer has not yet reached the outer wall of the colon.   Stage II--The cancer extends more deeply into or through the wall of the colon or rectum. It may have invaded nearby tissue, but cancer cells have not spread to the lymph nodes.   Stage III--The cancer has spread to nearby lymph nodes but not to other parts of the body.   Stage IV--The cancer has spread to other parts of the body, such as the liver or lungs.  Your health care provider may tell you the detailed stage of your cancer, which  includes both a number and a letter.  TREATMENT  Depending on the type and stage, colorectal cancer may be treated with surgery, radiation therapy, chemotherapy, targeted therapy, or radiofrequency ablation. Some people have a combination of these therapies. Surgery may be done to remove the polyps from your colon. In early stages, your health care provider may be able to do this during a colonoscopy. In later stages, surgery may be done to remove part of your colon.  HOME CARE INSTRUCTIONS   Only take over-the-counter or prescription medicines for pain, discomfort, or fever as directed by your health care provider.   Maintain a healthy diet.   Consider joining a support group. This may help you learn to cope with the stress of having colorectal cancer.   Seek advice to help you manage treatment of side effects.   Keep all follow-up appointments as directed by your health care provider.   Inform your cancer specialist if you are admitted to the hospital.  SEEK MEDICAL CARE IF:  Your diarrhea or constipation does not go away.   Your bowel habits change.  You have increased abdominal pain.   You notice new fatigue or weakness.  You lose weight. Document Released: 09/01/2005 Document Revised: 09/06/2013 Document Reviewed: 02/24/2013 [facility] Patient Information 2015 [place], [place]. This information is not intended to replace advice given to you by your health care provider. Make sure you discuss any questions you have with your health care provider.

## 2014-03-13 NOTE — Progress Notes (Signed)
4 Days Post-Op  Subjective: She feels much better. She has passed gas and had a bm.  She has some minimal pain on her right side.  Denies nausea.  Doing well with therapeutic lovenox, no bleeding.   Objective: Vital signs in last 24 hours: Temp:  [98.3 F (36.8 C)-98.7 F (37.1 C)] 98.3 F (36.8 C) (06/29 0456) Pulse Rate:  [64-91] 88 (06/29 0456) Resp:  [16-25] 16 (06/29 0754) BP: (122-135)/(66-79) 135/79 mmHg (06/29 0456) SpO2:  [93 %-100 %] 100 % (06/29 0754) Last BM Date: 03/12/14  Intake/Output from previous day: 06/28 0701 - 06/29 0700 In: 653.7 [P.O.:240; I.V.:279.7; IV Piggyback:134] Out: 850 [Urine:850] Intake/Output this shift:    Resp: clear to auscultation bilaterally Cardio: regular rate and rhythm GI: soft, mild tenderness. incisions ok  Lab Results:   Recent Labs  03/11/14 0415 03/12/14 0620 03/13/14 0438  WBC 8.8 9.4  --   HGB 8.2* 8.6* 8.4*  HCT 26.1* 27.7*  --   PLT 213 247  --    BMET  Recent Labs  03/11/14 0415 03/12/14 0620  NA 137 139  K 3.8 3.4*  CL 104 102  CO2 23 24  GLUCOSE 98 117*  BUN 4* 4*  CREATININE 0.78 0.65  CALCIUM 8.6 8.6   PT/INR No results found for this basename: LABPROT, INR,  in the last 72 hours ABG No results found for this basename: PHART, PCO2, PO2, HCO3,  in the last 72 hours  Studies/Results: No results found.  Anti-infectives: Anti-infectives   Start     Dose/Rate Route Frequency Ordered Stop   03/09/14 1800  cefoTEtan (CEFOTAN) 2 g in dextrose 5 % 50 mL IVPB     2 g 100 mL/hr over 30 Minutes Intravenous Every 12 hours 03/09/14 1458 03/09/14 1856   03/09/14 0600  cefoTEtan (CEFOTAN) 2 g in dextrose 5 % 50 mL IVPB     2 g 100 mL/hr over 30 Minutes Intravenous On call to O.R. 03/08/14 0845 03/09/14 0624   03/07/14 1330  ciprofloxacin (CIPRO) tablet 500 mg  Status:  Discontinued     500 mg Oral 2 times daily 03/07/14 1257 03/09/14 1458   03/06/14 1500  cefTRIAXone (ROCEPHIN) 1 g in dextrose 5 % 50 mL  IVPB  Status:  Discontinued     1 g 100 mL/hr over 30 Minutes Intravenous Every 24 hours 03/06/14 1356 03/07/14 1257      Assessment/Plan: s/p Procedure(s): LAPAROSCOPIC ASSISTED PARTIAL COLECTOMY, splenic flexure mobilization (N/A) D/c PCA PO narcotics Ambulate Anticipate d/c tom  LOS: 10 days    THOMAS, ALICIA C. 0/93/8182

## 2014-03-13 NOTE — Progress Notes (Signed)
Veronica Reyes   DOB:1956-06-04   UD#:[id number]   [id number]  Subjective: Patient denies vaginal bleeding.    Objective:  Filed Vitals:   03/13/14 0754  BP:   Pulse:   Temp:   Resp: 16    Body mass index is 51.55 kg/(m^2).  Intake/Output Summary (Last 24 hours) at 03/13/14 0813 Last data filed at 03/13/14 0457  Gross per 24 hour  Intake 567.51 ml  Output    850 ml  Net -282.49 ml    Laying in bed, obese.   Sclerae unicteric  Oropharynx clear  No peripheral adenopathy  Lungs clear -- no rales or rhonchi  Heart regular rate and rhythm  Abdomen incisional tenderness and normal bowel sounds  MSK  no peripheral edema, +TEDS  Neuro nonfocal   CBG (last 3)  No results found for this basename: GLUCAP,  in the last 72 hours   Labs:  Lab Results  Component Value Date   WBC 9.4 03/12/2014   HGB 8.4* 03/13/2014   HCT 27.7* 03/12/2014   MCV 92.0 03/12/2014   PLT 247 03/12/2014   NEUTROABS 6.9 03/04/2014    Urine Studies No results found for this basename: UACOL, UAPR, USPG, UPH, UTP, UGL, UKET, UBIL, UHGB, UNIT, UROB, ULEU, UEPI, UWBC, URBC, UBAC, CAST, CRYS, UCOM, BILUA,  in the last 72 hours  Basic Metabolic Panel:  Recent Labs Lab 03/08/14 0505 03/10/14 0402 03/11/14 0415 03/12/14 0620  NA 141 140 137 139  K 3.7 4.1 3.8 3.4*  CL 106 108 104 102  CO2 22 21 23 24   GLUCOSE 91 97 98 117*  BUN 9 4* 4* 4*  CREATININE 0.98 0.89 0.78 0.65  CALCIUM 8.8 8.6 8.6 8.6   GFR Estimated Creatinine Clearance: 114.5 ml/min (by C-G formula based on Cr of 0.65). Liver Function Tests: No results found for this basename: AST, ALT, ALKPHOS, BILITOT, PROT, ALBUMIN,  in the last 168 hours No results found for this basename: LIPASE, AMYLASE,  in the last 168 hours No results found for this basename: AMMONIA,  in the last 168 hours Coagulation profile No results found for this basename: INR, PROTIME,  in the last 168 hours  CBC:  Recent Labs Lab 03/09/14 0405  03/09/14 1358 03/10/14 0402 03/11/14 0415 03/12/14 0620 03/13/14 0438  WBC 9.2 13.6* 8.3 8.8 9.4  --   HGB 8.3* 10.1* 8.7* 8.2* 8.6* 8.4*  HCT 27.7* 33.1* 28.3* 26.1* 27.7*  --   MCV 96.5 95.4 95.0 94.9 92.0  --   PLT 299 271 220 213 247  --    Cardiac Enzymes: No results found for this basename: CKTOTAL, CKMB, CKMBINDEX, TROPONINI,  in the last 168 hours BNP: No components found with this basename: POCBNP,  CBG: No results found for this basename: GLUCAP,  in the last 168 hours D-Dimer No results found for this basename: DDIMER,  in the last 72 hours Hgb A1c No results found for this basename: HGBA1C,  in the last 72 hours Lipid Profile No results found for this basename: CHOL, HDL, LDLCALC, TRIG, CHOLHDL, LDLDIRECT,  in the last 72 hours Thyroid function studies No results found for this basename: TSH, T4TOTAL, FREET3, T3FREE, THYROIDAB,  in the last 72 hours Anemia work up No results found for this basename: VITAMINB12, FOLATE, FERRITIN, TIBC, IRON, RETICCTPCT,  in the last 72 hours Microbiology Recent Results (from the past 240 hour(s))  URINE CULTURE     Status: None   Collection Time    03/04/14 12:26  PM      Result Value Ref Range Status   Specimen Description URINE, CLEAN CATCH   Final   Special Requests NONE   Final   Culture  Setup Time     Final   Value: 03/04/2014 19:01     Performed at [facility]     Final   Value: >=100,000 COLONIES/ML     Performed at [facility]   Culture     Final   Value: ESCHERICHIA COLI     Performed at [facility]   Report Status 03/06/2014 FINAL   Final   Organism ID, Bacteria ESCHERICHIA COLI   Final  SURGICAL PCR SCREEN     Status: None   Collection Time    03/09/14  5:35 AM      Result Value Ref Range Status   MRSA, PCR NEGATIVE  NEGATIVE Final   Staphylococcus aureus NEGATIVE  NEGATIVE Final   Comment:            The Xpert SA Assay (FDA     approved for NASAL specimens     in  patients over 25 years of age),     is one component of     a comprehensive surveillance     program.  Test performance has     been validated by [employer] for patients greater     than or equal to 57 year old.     It is not intended     to diagnose infection nor to     guide or monitor treatment.      Studies:  No results found.  Assessment/Plan: 58 y.o.  female with history of breast cancer, now colon cancer s/p laparoscopic-assisted partial colectomy (03/09/2014), PE (01/06/2014) and history uterine fibroids admitted on 03/03/2014 with profound anemia (hgb 5.8).    1. 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  Patient had partial colectomy with path report currently pending  Will follow with recommendations once results available  --Follow in clinic 1-2 weeks s/p discharge.    2. History of PE (01/06/2014) She has a recent diagnosis of pulmonary embolism, likely secondary to malignancy in the setting of family history for stroke (and possible stroke history in patient as well) .  Lower extremity dopplers negative  -Continue xarelto 15 mg bid for 21 days then xalreto 20 mg daily. Megace as needed per Gyn.    3. History of Breast Cancer Left CIS in situ, s/p lumpectomy in 2006 and 2011 by Dr. Margot Chimes, and radiation in 2011 by Dr. Sondra Come  Last mammogram on 4/27 negative   4. Anemia In the setting of GIB with hemoccult positive, iron deficiency anemia as well as fibroids with subsequent heavy periods  Supportive transfusions recommended. feraheme 1,020 mg given this admission.  Iron supplements recommended.   5. Uterine Fibroids  Negative endometrial biopsy  Will need GYN evaluation as outpatient   6. Full Code  Fletcher Rathbun, MD 03/13/2014  8:13 AM

## 2014-03-13 NOTE — Progress Notes (Signed)
Physical Therapy Treatment Patient Details Name: Veronica Reyes MRN: [id number] DOB: 19-Apr-1956 Today's Date: 03/13/2014    History of Present Illness 58 yo female s/p lap partial colectomy, PE.     PT Comments    Progressing well with mobility. Min guard assist to stand, ambulate. Pt would like a rolling walker to take home. Pt has been walking with nursing.   Follow Up Recommendations  Home health PT     Equipment Recommendations  Rolling walker with 5" wheels    Recommendations for Other Services OT consult     Precautions / Restrictions Precautions Precautions: Fall Precaution Comments: abdominal surgery Restrictions Weight Bearing Restrictions: No    Mobility  Bed Mobility               General bed mobility comments: pt sitting in recliner  Transfers Overall transfer level: Needs assistance Equipment used: Rolling walker (2 wheeled) Transfers: Sit to/from Stand Sit to Stand: Min guard         General transfer comment: close guard for safety.   Ambulation/Gait Ambulation/Gait assistance: Min guard Ambulation Distance (Feet): 75 Feet Assistive device: Rolling walker (2 wheeled) Gait Pattern/deviations: Step-through pattern;Trunk flexed     General Gait Details: slow gait speed. close guard for safety   Stairs            Wheelchair Mobility    Modified Rankin (Stroke Patients Only)       Balance                                    Cognition Arousal/Alertness: Awake/alert Behavior During Therapy: WFL for tasks assessed/performed Overall Cognitive Status: Within Functional Limits for tasks assessed                      Exercises      General Comments        Pertinent Vitals/Pain 4/10 abdomen    Home Living                      Prior Function            PT Goals (current goals can now be found in the care plan section) Progress towards PT goals: Progressing toward goals     Frequency  Min 3X/week    PT Plan Current plan remains appropriate    Co-evaluation             End of Session   Activity Tolerance: Patient tolerated treatment well Patient left: in chair;with call bell/phone within reach     Time: 1343-1353 PT Time Calculation (min): 10 min  Charges:  $Gait Training: 8-22 mins                    G Codes:      Weston Anna, MPT Pager: [phone]

## 2014-03-20 ENCOUNTER — Telehealth: Payer: Self-pay | Admitting: Internal Medicine

## 2014-03-20 NOTE — Telephone Encounter (Signed)
S/W PATIENT AND GAVE HOSP F/U APPT FOR 07/14 @ 11 W/DR. CHISM.

## 2014-03-20 NOTE — Telephone Encounter (Signed)
C/D 03/20/14 for appt. 03/28/14

## 2014-03-21 ENCOUNTER — Telehealth (INDEPENDENT_AMBULATORY_CARE_PROVIDER_SITE_OTHER): Payer: Self-pay

## 2014-03-21 ENCOUNTER — Other Ambulatory Visit (HOSPITAL_COMMUNITY)
Admission: RE | Admit: 2014-03-21 | Discharge: 2014-03-21 | Disposition: A | Discharge status: Home / Self Care | Payer: BC Managed Care – PPO | Source: Ambulatory Visit | Attending: Internal Medicine | Admitting: Internal Medicine

## 2014-03-21 DIAGNOSIS — C189 Malignant neoplasm of colon, unspecified: Secondary | ICD-10-CM | POA: Insufficient documentation

## 2014-03-21 NOTE — Telephone Encounter (Signed)
Called pt to make her aware that I scheduled her for her po appt on 7/14 @ 9:40 and to arrive @ 9:25. Pt voiced understanding and this appt time is good for her.

## 2014-03-23 ENCOUNTER — Other Ambulatory Visit: Payer: Self-pay | Admitting: Medical Oncology

## 2014-03-24 ENCOUNTER — Other Ambulatory Visit: Payer: Self-pay

## 2014-03-24 DIAGNOSIS — I2699 Other pulmonary embolism without acute cor pulmonale: Secondary | ICD-10-CM

## 2014-03-24 MED ORDER — RIVAROXABAN 20 MG PO TABS: 20.0000 mg | ORAL_TABLET | Freq: Every day | ORAL | Status: DC

## 2014-03-24 NOTE — Telephone Encounter (Signed)
S/w pt. She had filled xarelto 15mg  tab #30 BID twice. Dr Juliann Mule said to change to 20mg  tab daily. Will e-scribe Rx to [facility]. She is looking into assistance for paying for xarelto.

## 2014-03-28 ENCOUNTER — Encounter (INDEPENDENT_AMBULATORY_CARE_PROVIDER_SITE_OTHER): Payer: Self-pay | Admitting: General Surgery

## 2014-03-28 ENCOUNTER — Ambulatory Visit (INDEPENDENT_AMBULATORY_CARE_PROVIDER_SITE_OTHER): Payer: BC Managed Care – PPO | Admitting: General Surgery

## 2014-03-28 ENCOUNTER — Encounter: Payer: Self-pay | Admitting: Internal Medicine

## 2014-03-28 ENCOUNTER — Telehealth: Payer: Self-pay | Admitting: Internal Medicine

## 2014-03-28 ENCOUNTER — Ambulatory Visit (HOSPITAL_BASED_OUTPATIENT_CLINIC_OR_DEPARTMENT_OTHER): Payer: BC Managed Care – PPO | Admitting: Internal Medicine

## 2014-03-28 ENCOUNTER — Encounter (HOSPITAL_COMMUNITY): Payer: Self-pay

## 2014-03-28 ENCOUNTER — Ambulatory Visit: Payer: BC Managed Care – PPO

## 2014-03-28 ENCOUNTER — Other Ambulatory Visit (HOSPITAL_BASED_OUTPATIENT_CLINIC_OR_DEPARTMENT_OTHER): Payer: BC Managed Care – PPO

## 2014-03-28 ENCOUNTER — Other Ambulatory Visit: Payer: Self-pay | Admitting: Internal Medicine

## 2014-03-28 VITALS — BP 127/80 | HR 74 | Temp 98.1°F | Resp 16 | Ht 66.0 in | Wt 285.0 lb

## 2014-03-28 VITALS — BP 137/81 | HR 67 | Temp 98.0°F | Resp 18 | Ht 66.0 in | Wt 285.3 lb

## 2014-03-28 DIAGNOSIS — N289 Disorder of kidney and ureter, unspecified: Secondary | ICD-10-CM

## 2014-03-28 DIAGNOSIS — D509 Iron deficiency anemia, unspecified: Secondary | ICD-10-CM

## 2014-03-28 DIAGNOSIS — Z853 Personal history of malignant neoplasm of breast: Secondary | ICD-10-CM

## 2014-03-28 DIAGNOSIS — E876 Hypokalemia: Secondary | ICD-10-CM

## 2014-03-28 DIAGNOSIS — C187 Malignant neoplasm of sigmoid colon: Secondary | ICD-10-CM

## 2014-03-28 DIAGNOSIS — I2699 Other pulmonary embolism without acute cor pulmonale: Secondary | ICD-10-CM

## 2014-03-28 DIAGNOSIS — Z85038 Personal history of other malignant neoplasm of large intestine: Secondary | ICD-10-CM

## 2014-03-28 DIAGNOSIS — D259 Leiomyoma of uterus, unspecified: Secondary | ICD-10-CM

## 2014-03-28 DIAGNOSIS — D500 Iron deficiency anemia secondary to blood loss (chronic): Secondary | ICD-10-CM

## 2014-03-28 LAB — CBC WITH DIFFERENTIAL/PLATELET
BASO%: 0.6 % (ref 0.0–2.0)
BASOS ABS: 0 10*3/uL (ref 0.0–0.1)
EOS%: 1 % (ref 0.0–7.0)
Eosinophils Absolute: 0.1 10*3/uL (ref 0.0–0.5)
HEMATOCRIT: 39.8 % (ref 34.8–46.6)
HGB: 12 g/dL (ref 11.6–15.9)
LYMPH%: 35.6 % (ref 14.0–49.7)
MCH: 28.2 pg (ref 25.1–34.0)
MCHC: 30.2 g/dL — ABNORMAL LOW (ref 31.5–36.0)
MCV: 93.4 fL (ref 79.5–101.0)
MONO#: 0.4 10*3/uL (ref 0.1–0.9)
MONO%: 5.7 % (ref 0.0–14.0)
NEUT#: 4 10*3/uL (ref 1.5–6.5)
NEUT%: 57.1 % (ref 38.4–76.8)
Platelets: 403 10*3/uL — ABNORMAL HIGH (ref 145–400)
RBC: 4.26 10*6/uL (ref 3.70–5.45)
RDW: 16.2 % — ABNORMAL HIGH (ref 11.2–14.5)
WBC: 7 10*3/uL (ref 3.9–10.3)
lymph#: 2.5 10*3/uL (ref 0.9–3.3)

## 2014-03-28 LAB — COMPREHENSIVE METABOLIC PANEL (CC13)
ALT: 41 U/L (ref 0–55)
AST: 40 U/L — ABNORMAL HIGH (ref 5–34)
Albumin: 3.5 g/dL (ref 3.5–5.0)
Alkaline Phosphatase: 40 U/L (ref 40–150)
Anion Gap: 9 mEq/L (ref 3–11)
BUN: 12.7 mg/dL (ref 7.0–26.0)
CALCIUM: 10 mg/dL (ref 8.4–10.4)
CHLORIDE: 105 meq/L (ref 98–109)
CO2: 27 mEq/L (ref 22–29)
CREATININE: 0.9 mg/dL (ref 0.6–1.1)
GLUCOSE: 102 mg/dL (ref 70–140)
Potassium: 3.4 mEq/L — ABNORMAL LOW (ref 3.5–5.1)
Sodium: 141 mEq/L (ref 136–145)
Total Bilirubin: 0.33 mg/dL (ref 0.20–1.20)
Total Protein: 8.1 g/dL (ref 6.4–8.3)

## 2014-03-28 LAB — LACTATE DEHYDROGENASE (CC13): LDH: 202 U/L (ref 125–245)

## 2014-03-28 MED ORDER — IBUPROFEN 400 MG PO TABS: 400.0000 mg | ORAL_TABLET | Freq: Four times a day (QID) | ORAL | Status: DC | PRN

## 2014-03-28 NOTE — Progress Notes (Signed)
[place] OFFICE PROGRESS NOTE  [name]., FNP [name] 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 Anemia    CURRENT TREATMENT:  Xalreto 20 mg daily.   INTERVAL HISTORY: Veronica Reyes 58 y.o. female with a history of breast cancer, now colon cancer s/p laparoscopic-assisted partial colectomy (03/09/2014), PE (01/06/2014) and history uterine fibroids admitted on 03/03/2014 with profound anemia (hgb 5.8).  She was discharged on 03/13/2014.  She is here for follow up.  She denies any evidence of uterine bleeding or hematochezia or melena.   MEDICAL HISTORY: Past Medical History  Diagnosis Date  . Hypertension   . Cancer     left breast cancer x2  . Breast CA     radiation and surgery  . Shortness of breath     sob with exertion. dx. with Pulmonary emboli 4'15 ,tx. Xarelto,Lovenox used for [employer].  . Pulmonary embolism     "blood clot in lungs" -dx. 4'15 with CT Chest.    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 coli UTI (urinary tract infection); H/O colon cancer, stage II; and Renal lesion on her problem list.    ALLERGIES:  has No Known Allergies.  MEDICATIONS: has a current medication list which includes the following prescription(s): diphenhydramine-acetaminophen, ferrous sulfate, hydrochlorothiazide, ibuprofen, metoprolol, multivitamin with minerals, and rivaroxaban.  SURGICAL HISTORY:  Past  Surgical History  Procedure Laterality Date  . Cesarean section    . Breast surgery      '11- Left breast lumpectomy  . Cholecystectomy      Laparoscopic 20 yrs ago  . Partial colectomy N/A 03/09/2014    Procedure: LAPAROSCOPIC ASSISTED PARTIAL COLECTOMY, splenic flexure mobilization;  Surgeon: Leighton Ruff, MD;  Location: [facility];  Service: General;  Laterality: N/A;    REVIEW OF SYSTEMS:   Constitutional: Denies fevers, chills or abnormal weight loss Eyes: Denies blurriness of vision Ears, nose, mouth, throat, and face: Denies mucositis or sore throat Respiratory: Denies cough, dyspnea or wheezes Cardiovascular: Denies palpitation, chest discomfort or lower extremity swelling Gastrointestinal:  Denies nausea, heartburn or change in bowel habits Skin: Denies abnormal skin rashes Lymphatics: Denies new lymphadenopathy or easy bruising Neurological:Denies numbness, tingling or new weaknesses Behavioral/Psych: Mood is stable, no new changes  All other systems were reviewed with the patient and are negative.  PHYSICAL EXAMINATION: ECOG PERFORMANCE STATUS: 0 - Asymptomatic  Blood pressure 137/81, pulse 67, temperature 98 F (36.7 C), temperature source Oral, resp. rate 18, height '5\' 6"'  (1.676 m), weight 285 lb 4.8 oz (129.411 kg), last menstrual period 01/23/2014, SpO2 100.00%.  GENERAL:alert, no distress and comfortable; well developed and obese. Easily mobile to exam table.  SKIN: skin color, texture, turgor are normal, no rashes or significant lesions EYES: normal, Conjunctiva are pink and non-injected, sclera clear OROPHARYNX:no exudate, no erythema and lips, buccal mucosa, and tongue normal  NECK: supple, thyroid normal size, non-tender, without nodularity LYMPH:  no palpable lymphadenopathy in the cervical, axillary or supraclavicular LUNGS: clear to auscultation with normal breathing effort, no wheezes  or rhonchi HEART: regular rate & rhythm and no murmurs and no lower  extremity edema ABDOMEN:abdomen soft, non-tender and normal bowel sounds; incision healing with signs of infection.  Musculoskeletal:no cyanosis of digits and no clubbing  NEURO: alert & oriented x 3 with fluent speech, no focal motor/sensory deficits  Labs:  Lab Results  Component Value Date   WBC 7.0 03/28/2014   HGB 12.0 03/28/2014   HCT 39.8 03/28/2014   MCV 93.4 03/28/2014   PLT 403* 03/28/2014   NEUTROABS 4.0 03/28/2014      Chemistry      Component Value Date/Time   NA 141 03/28/2014 1111   NA 139 03/12/2014 0620   K 3.4* 03/28/2014 1111   K 3.4* 03/12/2014 0620   CL 102 03/12/2014 0620   CO2 27 03/28/2014 1111   CO2 24 03/12/2014 0620   BUN 12.7 03/28/2014 1111   BUN 4* 03/12/2014 0620   CREATININE 0.9 03/28/2014 1111   CREATININE 0.65 03/12/2014 0620      Component Value Date/Time   CALCIUM 10.0 03/28/2014 1111   CALCIUM 8.6 03/12/2014 0620   ALKPHOS 40 03/28/2014 1111   ALKPHOS 38* 03/03/2014 1831   AST 40* 03/28/2014 1111   AST 37 03/03/2014 1831   ALT 41 03/28/2014 1111   ALT 30 03/03/2014 1831   BILITOT 0.33 03/28/2014 1111   BILITOT 0.2* 03/03/2014 1831       Basic Metabolic Panel:  Recent Labs Lab 03/28/14 1111  NA 141  K 3.4*  CO2 27  GLUCOSE 102  BUN 12.7  CREATININE 0.9  CALCIUM 10.0   GFR Estimated Creatinine Clearance: 95 ml/min (by C-G formula based on Cr of 0.9). Liver Function Tests:  Recent Labs Lab 03/28/14 1111  AST 40*  ALT 41  ALKPHOS 40  BILITOT 0.33  PROT 8.1  ALBUMIN 3.5   CBC:  Recent Labs Lab 03/28/14 1111  WBC 7.0  NEUTROABS 4.0  HGB 12.0  HCT 39.8  MCV 93.4  PLT 403*   Studies:  No results found.   RADIOGRAPHIC STUDIES: Mr Abdomen W Wo Contrast (Reviewed personally by me)  03/06/2014   CLINICAL DATA:  Weakness. Anemia. History of breast cancer. Evaluate renal cyst.  EXAM: MRI ABDOMEN WITHOUT AND WITH CONTRAST  TECHNIQUE: Multiplanar multisequence MR imaging of the abdomen was performed both before and after the  administration of intravenous contrast.  CONTRAST:  52m MULTIHANCE GADOBENATE DIMEGLUMINE 529 MG/ML IV SOLN  COMPARISON:  CT of the abdomen and pelvis 03/04/2014.  FINDINGS: There are several lesions in the kidneys bilaterally which are low signal intensity on T1 weighted images, high signal intensity on T2 weighted images, and do not enhance, compatible with simple cysts. The largest simple cyst measures 2.9 cm in diameter in the medial aspect of the interpolar region of the left kidney. The largest renal lesion overall is in the interpolar region of the right kidney measuring 4.4 x 3.7 cm. This lesion is also generally low T1 signal intensity and high T2 signal intensity, without internal enhancement. However, there is some very subtle nodularity along the anterior margin of the lesion best appreciated on image 37 of series 8, which corresponds to some minimal enhancement on post gadolinium images, best appreciated on image 59 of series 1102 (this area of potential mural nodularity is favored to be normal adjacent renal parenchyma rather than a true mural nodule). This lesion also has multiple thin internal nonenhancing septations, which are best appreciated on coronal T2 weighted images (image  29 of series 9). Diffusion-weighted sequences demonstrate some diffusion restriction (lesion is dark on ADC map).  In addition, there is some amorphous soft tissue located beneath segment 1 of the liver, posterior to the portal vein, anterior to the IVC and upper pole of the right kidney, posterior the second portion of the duodenum and immediately above and posterior to the head and uncinate process of the pancreas. This amorphous soft tissue is intimately associated with all adjacent structures, and is T2 hyperintense on respiratory triggered fat sat T2 weighted images (best appreciated on images 24-30 of series 4), hypointense spine T1 weighted images compared to normal pancreatic and hepatic parenchyma, demonstrates  diffusion restriction (images 18 of series 3 and 21 of series 300), and demonstrates slightly differential enhancement when compared to either the hepatic or pancreatic parenchyma (best appreciated on subtraction image 37 of series 11102).  There are 2 tiny sub cm lesions in the liver which are low T1 signal intensity, high T2 signal intensity, and do not enhance, compatible with tiny simple cysts or biliary hamartomas. Status post cholecystectomy. The appearance of the spleen and bilateral adrenal glands is unremarkable.  IMPRESSION: 1. The lesion of concern in the lateral aspect of the interpolar region of the right kidney has indeterminate imaging characteristics, but is favored to be benign, and is currently classified as a Bosniak class IIF lesion. Repeat MRI with and without IV gadolinium in 6 months is recommended to ensure stability. This recommendation follows ACR consensus guidelines: Managing Incidental Findings on Abdominal CT: White Paper of the ACR Incidental Findings Committee. J Am Coll Radiol 2010;7:754-773. 2. In addition, there is some amorphous soft tissue in the portacaval and hepatoduodenal ligament regions which has indeterminate but potentially malignant imaging characteristics (with diffusion restriction). This is of uncertain etiology and significance, but could represent a lymphoma, or potentially nodal metastatic disease. Correlation with PET-CT is recommended to assess for hypermetabolism. Should this lesion prove to be hypermetabolic on PET-CT, further evaluation with endoscopic ultrasound and biopsy would be suggested. 3. Multiple additional simple renal cysts. These results will be called to the ordering clinician or representative by the Radiologist Assistant, and communication documented in the PACS or zVision Dashboard.   Electronically Signed   By: Vinnie Langton M.D.   On: 03/06/2014 08:57   US Transvaginal Non-ob  03/04/2014   CLINICAL DATA:  Vaginal bleeding  EXAM:  TRANSABDOMINAL AND TRANSVAGINAL ULTRASOUND OF PELVIS  TECHNIQUE: Both transabdominal and transvaginal ultrasound examinations of the pelvis were performed. Transabdominal technique was performed for global imaging of the pelvis including uterus, ovaries, adnexal regions, and pelvic cul-de-sac. It was necessary to proceed with endovaginal exam following the transabdominal exam to visualize the endometrium.  COMPARISON:  None  FINDINGS: Uterus  Measurements: 9.1 x 7.2 x 7.1 cm. Uterus is heterogeneous, with 3 mildly hypoechoic mass is identified consistent fibroids the largest measuring 2.6 cm. In the fundus there is an intramural fibroid measuring 2.2 cm. The 2.6 cm fibroid is seen in the subendometrial position in the anterior body of the uterus, with an adjacent 1.2 cm fibroid. The uterus is retroverted.  Endometrium  Thickness: 14 mm.  Heterogeneous echotexture  Right ovary  Measurements: 30 x 20 x 18 mm. Normal appearance/no adnexal mass.  Left ovary  Not visualized  Other findings  No free fluid.  IMPRESSION: 1. 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 2. Heterogeneous prominent endometrium which could be due  to hyperplasia, polyp, or carcinoma.   Electronically Signed   By: Skipper Cliche M.D.   On: 03/04/2014 09:36   US Pelvis Complete  03/04/2014   CLINICAL DATA:  Vaginal bleeding  EXAM: TRANSABDOMINAL AND TRANSVAGINAL ULTRASOUND OF PELVIS  TECHNIQUE: Both transabdominal and transvaginal ultrasound examinations of the pelvis were performed. Transabdominal technique was performed for global imaging of the pelvis including uterus, ovaries, adnexal regions, and pelvic cul-de-sac. It was necessary to proceed with endovaginal exam following the transabdominal exam to visualize the endometrium.  COMPARISON:  None  FINDINGS: Uterus  Measurements: 9.1 x 7.2 x 7.1 cm. Uterus is heterogeneous, with 3 mildly hypoechoic mass is identified  consistent fibroids the largest measuring 2.6 cm. In the fundus there is an intramural fibroid measuring 2.2 cm. The 2.6 cm fibroid is seen in the subendometrial position in the anterior body of the uterus, with an adjacent 1.2 cm fibroid. The uterus is retroverted.  Endometrium  Thickness: 14 mm.  Heterogeneous echotexture  Right ovary  Measurements: 30 x 20 x 18 mm. Normal appearance/no adnexal mass.  Left ovary  Not visualized  Other findings  No free fluid.  IMPRESSION: 1. 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 2. Heterogeneous prominent endometrium which could be due to hyperplasia, polyp, or carcinoma.   Electronically Signed   By: Skipper Cliche M.D.   On: 03/04/2014 09:36   Ct Abdomen Pelvis W Contrast (Reviewed personally by me)  03/04/2014   CLINICAL DATA:  Generalized weakness, tiredness, tachycardia, anemia, bladder and bowels, menorrhagia, history breast cancer post radiation therapy, colon cancer pre colectomy, hypertension  EXAM: CT ABDOMEN AND PELVIS WITH CONTRAST  TECHNIQUE: Multidetector CT imaging of the abdomen and pelvis was performed using the standard protocol following bolus administration of intravenous contrast. Sagittal and coronal MPR images reconstructed from axial data set.  CONTRAST:  88m OMNIPAQUE IOHEXOL 300 MG/ML SOLN, 1010mOMNIPAQUE IOHEXOL 300 MG/ML SOLN  COMPARISON:  01/06/2014  FINDINGS: Lung bases clear.  Gallbladder surgically absent.  BILATERAL renal cysts, largest at RIGHT inferior pole questionably demonstrating mural nodularity, overall lesion 4.1 x 4.1 x 3.6 cm.  4 mm low-attenuation focus anteriorly in medial segment LEFT lobe liver image 21 unchanged.  Question new 6 mm low-attenuation nodule medial segment LEFT lobe liver image 21.  Remainder of liver, spleen, pancreas, kidneys, and adrenal glands normal appearance.  Central low attenuation within uterus could be related to phase of menses.   Unremarkable bladder, ureters, and adnexae.  Normal appendix.  Mass at distal descending colon 3.8 x 4.1 cm image 59, approximately 5.8 cm length.  9 mm short axis lymph node in mesentery medial to the descending colon a previously 10 mm.  Stomach and bowel loops otherwise unremarkable.  No mass, adenopathy, free fluid or free air.  Mild scattered atherosclerotic calcification.  No acute osseous findings.  IMPRESSION: 3.8 x 4.1 x 5.8 cm diameter distal descending colon mass compatible with a primary colonic malignancy.  9 mm short axis lymph node in the adjacent colonic mesenteric.  BILATERAL renal cysts, largest of which at the RIGHT kidney shows questionable area of mural nodularity; followup nonemergent characterization of this lesion by MR imaging with and without contrast recommended to exclude cystic renal neoplasm.  Two nonspecific low-attenuation foci within liver.   Electronically Signed   By: MaLavonia Dana.D.   On: 03/04/2014 11:12   Dg Chest Port 1 View  03/03/2014  CLINICAL DATA:  Shortness of breath with exertion  EXAM: PORTABLE CHEST - 1 VIEW  COMPARISON:  None.  FINDINGS: Moderate cardiac enlargement similar to prior study. Mild vascular congestion with no evidence of edema or consolidation.  IMPRESSION: Cardiac enlargement with minimal vascular congestion and no pulmonary edema   Electronically Signed   By: Skipper Cliche M.D.   On: 03/03/2014 19:07   PATHOLOGY: Diagnosis 1. Colon, segmental resection for tumor, sigmoid - INVASIVE ADENOCARCINOMA, SEE COMMENT. - TUMOR EXTENDS INTO MUSCULARIS PROPRIA. - NEGATIVE FOR LYMPH VASCULAR INVASION. Diagnosis(continued) - NEGATIVE FOR PERINEURAL INVASION.- VISCERAL PERITONEUM, NEGATIVE FOR TUMOR.- FOURTEEN LYMPH NODES, NEGATIVE FOR TUMOR (0/14).- SURGICAL RESECTION MARGINS, NEGATIVE FOR TUMOR.- ENDOSCOPIC TATTOOING IDENTIFIED.- SEE TUMOR SYNOPTIC TEMPLATE BELOW. 2. Colon, resection margin (donut) - BENIGN COLON. - NEGATIVE FOR DYSPLASIA OR  MALIGNANCY. Microscopic Comment 1. COLON AND RECTUM (INCLUDING TRANS-ANAL RESECTION): Specimen: Sigmoid colon. Procedure: Resection. Tumor site: Mesenteric-antimesenteric junction. Specimen integrity: Intact. Macroscopic intactness of mesorectum: Not applicable. Macroscopic tumor perforation: Absent. Invasive tumor: Maximum size: See comment. Histologic type(s): Adenocarcinoma. Histologic grade and differentiation: G1: well differentiated/low grade Type of polyp in which invasive carcinoma arose: Tubulovillous adenoma with high grade dysplasia. Microscopic extension of invasive tumor: Tumor focally extends into muscularis propria, see comment. Lymph-Vascular invasion: Absent. Peri-neural invasion: Absent. Tumor deposit(s) (discontinuous extramural extension): Absent. Resection margins: Proximal margin: Negative. Distal margin: Negative. Circumferential (radial) (posterior ascending, posterior descending; lateral and posterior mid-rectum; and entire lower 1/3 rectum): Negative.Mesenteric margin (sigmoid and transverse): Negative. Distance closest margin (if all above margins negative): Mesenteric margin (5 cm). Treatment effect (neo-adjuvant therapy): None. Additional polyp(s): None. Non-neoplastic findings: None. Lymph nodes: number examined 14; number positive: 0 Pathologic Staging: pT2, pN0, pMX Ancillary studies: Per the [place] gastrointestinal working group guidelines, tissue will be submitted for microsatellite instability by PCR as well as mismatch repair protein expression by immunohistochemistry. Comments: The mass is entirely submitted for review. There are foci of invasive adenocarcinoma arising in a background of a polypoid tubular adenoma with high grade dysplasia. There is prominent prolapsing of the muscularis propria. However, areas demonstrate definitive involvement of muscularis propria by invasive tumor (pT2). (CRR:ecj 03/14/2014) [place] RUND DO Pathologist, Electronic Signature (Case  signed 03/14/2014)  Mismatch Repair (MMR) Protein Immunohistochemistry (IHC)  IHC Expression Result:  MLH1: Preserved nuclear expression (greater 50% tumor expression)  MSH2: Preserved nuclear expression (greater 50% tumor expression)  MSH6: Preserved nuclear expression (greater 50% tumor expression)  PMS2: Preserved nuclear expression (greater 50% tumor expression)  * Internal control demonstrates intact nuclear expression  Interpretation: NORMAL  ASSESSMENT: Veronica Reyes 58 y.o. 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  Patient had partial colectomy. No role for adjuvant chemotherapy. Colonoscopy per GI in one to three years per NCCN guidelines.   2. History of PE (01/06/2014) --She has a recent diagnosis of pulmonary embolism, likely secondary to malignancy in the setting of family history for stroke (and possible stroke history in patient as well).  Lower extremity dopplers negative. Treat for at least 6 months.  -Continue xalreto 20 mg daily. Megace as needed per Gyn.   3. History of Breast Cancer --Left CIS  in situ, s/p lumpectomy in 2006 and 2011 by Dr. Margot Chimes, and radiation in 2011 by Dr. Sondra Come  Last mammogram on 4/27 negative   4. Anemia of acute blood blood lost secondary to #5 --Hbg is 12.  feraheme 1,020 mg given recent admission.  Iron supplements recommended as needed. Continue ferrous sulfate 325 mg daily.  Repeat labs in 1 month including iron studies and CBC to ensure stability.   5. Uterine Fibroids  --Negative endometrial biopsy  Following with Gynecology.  6. Kidney  lesion (Right). --Patient will require follow up MRI of the abdomen in six months around 08/2014.   7. Hypokalemia, mild. --Likely secondary to hydrochlorothiazide.  We will follow with labs next month and if persistently low, consider alternative anti-hypertensive.   8. Follow up. --Patient instructed to follow up in 3 months for labs and repeat labs in 1 month including CBC and iron studies.   All questions were answered. The patient knows to call the clinic with any problems, questions or concerns. We can certainly see the patient much sooner if necessary.  I spent 25 minutes counseling the patient face to face. The total time spent in the appointment was 40 minutes.    Oryon Gary, MD 03/28/2014 4:34 PM

## 2014-03-28 NOTE — Telephone Encounter (Signed)
gv adn rpitned appt sched and avs for pt for Aug and OCT

## 2014-03-28 NOTE — Progress Notes (Signed)
Veronica Reyes is a 58 y.o. female who is status post a L colectomy for colon cancer on 6/26.  She reports regular BM's.  No bleeding rectally or vaginally.  She is on Xarelto.  Her pathology revealed a stage 1 colon cancer with normal mismatch repair expression.   Objective: Filed Vitals:   03/28/14 0946  BP: 127/80  Pulse: 74  Temp: 98.1 F (36.7 C)  Resp: 16    General appearance: alert and cooperative GI: normal findings: soft, non-tender  Incision: healing well   Assessment: s/p  Patient Active Problem List   Diagnosis Date Noted  . E. coli UTI (urinary tract infection) 03/06/2014  . Cancer of sigmoid colon s/p colectomy 03/09/2014 03/04/2014  . Menorrhagia with irregular cycle 03/04/2014  . Fibroids 03/04/2014  . Iron deficiency anemia 03/03/2014  . Pulmonary embolism 01/07/2014  . Hepatic lesion 01/07/2014  . Breast cancer 01/07/2014  . Essential hypertension, benign 01/07/2014   Mismatch Repair (MMR) Protein Immunohistochemistry (IHC) IHC Expression Result: MLH1: Preserved nuclear expression (greater 50% tumor expression) MSH2: Preserved nuclear expression (greater 50% tumor expression) MSH6: Preserved nuclear expression (greater 50% tumor expression) PMS2: Preserved nuclear expression (greater 50% tumor expression) * Internal control demonstrates intact nuclear expression Interpretation: NORMAL FINAL DIAGNOSIS Final Pathology Diagnosis 1. Colon, segmental resection for tumor, sigmoid - INVASIVE ADENOCARCINOMA, SEE COMMENT. - TUMOR EXTENDS INTO MUSCULARIS PROPRIA. - NEGATIVE FOR LYMPH VASCULAR INVASION. -T2N0   CT Chest Abd and pelvis IMPRESSION (01/02/14):  1. Acute pulmonary within the left lower lobe pulmonary arteries. Overall clot burden is mild to moderate.  2. Ill-defined mass within the proximal sigmoid colon / distal descending colon. No evidence of bowel obstruction.  3. Metastatic lymph node along the mesenteric border of the colon  adjacent to the mass.  4. Tiny indeterminate 5 mm lesion within the central left hepatic lobe.  5. Sclerotic lesion in the posterior aspect of the right iliac bone is also indeterminate but could be evaluated with FDG PET-CT scan.    CT abd and pelvis IMPRESSION (03/04/14):  3.8 x 4.1 x 5.8 cm diameter distal descending colon mass compatible with a primary colonic malignancy. 9 mm short axis lymph node in the adjacent colonic mesenteric.  BILATERAL renal cysts, largest of which at the RIGHT kidney shows questionable area of mural nodularity; followup nonemergent characterization of this lesion by MR imaging with and without contrast recommended to exclude cystic renal neoplasm.  Two nonspecific low-attenuation foci within liver.   MR Abd IMPRESSION:  1. The lesion of concern in the lateral aspect of the interpolar region of the right kidney has indeterminate imaging characteristics, but is favored to be benign, and is currently classified as a Bosniak class IIF lesion. Repeat MRI with and  without IV gadolinium in 6 months is recommended to ensure stability. This recommendation follows ACR consensus guidelines: Managing Incidental Findings on Abdominal CT: White Paper of the ACR Incidental Findings Committee. J Am Coll Radiol 2010;7:754-773.  2. In addition, there is some amorphous soft tissue in the portacaval and hepatoduodenal ligament regions which has indeterminate but potentially malignant imaging characteristics (with diffusion restriction). This is of uncertain etiology and  significance, but could represent a lymphoma, or potentially nodal metastatic disease. Correlation with PET-CT is recommended to assess for hypermetabolism. Should this lesion prove to be hypermetabolic on PET-CT, further evaluation with endoscopic ultrasound and biopsy would be suggested.  3. Multiple additional simple renal cysts.   Plan: Doing well after surgery.  Stage 1 colon cancer.  She has an apt with Dr Juliann Mule  to discuss her anticoagulation, cancer and abnormal imaging.  I will see her back in 1 year for my follow up or sooner if needed.  I recommended a high fiber diet and no heavy lifting for 8 weeks after surgery.      Rosario Adie, [facility], [place]   03/28/2014 9:52 AM

## 2014-03-28 NOTE — Progress Notes (Signed)
Checked in patient for hosp follow up. She has not issues prior to seeing the dr. She has appt card and [place]

## 2014-03-28 NOTE — Patient Instructions (Signed)
Return to the office in 1 year for follow up.  Call the office if any problems develop before then.

## 2014-04-21 ENCOUNTER — Telehealth: Payer: Self-pay | Admitting: Dietician

## 2014-04-21 NOTE — Telephone Encounter (Signed)
Brief Outpatient Oncology Nutrition Note  Patient has been identified to be at risk on malnutrition screen.  Wt Readings from Last 10 Encounters:  03/28/14 285 lb 4.8 oz (129.411 kg)  03/28/14 285 lb (129.275 kg)  03/10/14 319 lb 3.6 oz (144.8 kg)  03/10/14 319 lb 3.6 oz (144.8 kg)  03/03/14 296 lb (134.265 kg)  01/09/14 301 lb 9.6 oz (136.805 kg)  01/08/14 302 lb 7.5 oz (137.2 kg)  09/30/11 307 lb (139.254 kg)      Dx:  Cancer of sigmoid colon s/p colectomy 03/09/14.    Called patient due to weight loss.  Patient reports she is now down to 271 lbs and UBW was 300 lbs in April.  Patient states that she is eating well, very healthy, and has felt the best she has felt in 2 years.  Encouraged patient with continued healthy eating.  Antonieta Iba, RD, LDN

## 2014-05-09 ENCOUNTER — Other Ambulatory Visit (HOSPITAL_BASED_OUTPATIENT_CLINIC_OR_DEPARTMENT_OTHER): Payer: BC Managed Care – PPO

## 2014-05-09 DIAGNOSIS — D509 Iron deficiency anemia, unspecified: Secondary | ICD-10-CM

## 2014-05-09 DIAGNOSIS — C187 Malignant neoplasm of sigmoid colon: Secondary | ICD-10-CM

## 2014-05-09 LAB — CBC WITH DIFFERENTIAL/PLATELET
BASO%: 0.7 % (ref 0.0–2.0)
Basophils Absolute: 0.1 10*3/uL (ref 0.0–0.1)
EOS%: 1.2 % (ref 0.0–7.0)
Eosinophils Absolute: 0.1 10*3/uL (ref 0.0–0.5)
HEMATOCRIT: 43 % (ref 34.8–46.6)
HGB: 13.9 g/dL (ref 11.6–15.9)
LYMPH%: 33.7 % (ref 14.0–49.7)
MCH: 28.6 pg (ref 25.1–34.0)
MCHC: 32.4 g/dL (ref 31.5–36.0)
MCV: 88.4 fL (ref 79.5–101.0)
MONO#: 0.5 10*3/uL (ref 0.1–0.9)
MONO%: 5.7 % (ref 0.0–14.0)
NEUT#: 4.7 10*3/uL (ref 1.5–6.5)
NEUT%: 58.7 % (ref 38.4–76.8)
Platelets: 244 10*3/uL (ref 145–400)
RBC: 4.86 10*6/uL (ref 3.70–5.45)
RDW: 16.9 % — ABNORMAL HIGH (ref 11.2–14.5)
WBC: 8 10*3/uL (ref 3.9–10.3)
lymph#: 2.7 10*3/uL (ref 0.9–3.3)

## 2014-05-09 LAB — BASIC METABOLIC PANEL (CC13)
ANION GAP: 8 meq/L (ref 3–11)
BUN: 8.1 mg/dL (ref 7.0–26.0)
CALCIUM: 9.5 mg/dL (ref 8.4–10.4)
CO2: 27 mEq/L (ref 22–29)
Chloride: 107 mEq/L (ref 98–109)
Creatinine: 0.8 mg/dL (ref 0.6–1.1)
Glucose: 97 mg/dl (ref 70–140)
Potassium: 3.2 mEq/L — ABNORMAL LOW (ref 3.5–5.1)
Sodium: 142 mEq/L (ref 136–145)

## 2014-05-09 LAB — IRON AND TIBC CHCC
%SAT: 19 % — AB (ref 21–57)
Iron: 59 ug/dL (ref 41–142)
TIBC: 304 ug/dL (ref 236–444)
UIBC: 245 ug/dL (ref 120–384)

## 2014-05-09 LAB — FERRITIN CHCC: FERRITIN: 58 ng/mL (ref 9–269)

## 2014-06-27 ENCOUNTER — Other Ambulatory Visit (HOSPITAL_BASED_OUTPATIENT_CLINIC_OR_DEPARTMENT_OTHER): Payer: Self-pay

## 2014-06-27 ENCOUNTER — Encounter: Payer: Self-pay | Admitting: Hematology

## 2014-06-27 ENCOUNTER — Telehealth: Payer: Self-pay | Admitting: Hematology

## 2014-06-27 ENCOUNTER — Ambulatory Visit (HOSPITAL_BASED_OUTPATIENT_CLINIC_OR_DEPARTMENT_OTHER): Payer: BC Managed Care – PPO | Admitting: Hematology

## 2014-06-27 VITALS — BP 139/88 | HR 57 | Temp 98.5°F | Resp 18 | Ht 66.0 in | Wt 281.8 lb

## 2014-06-27 DIAGNOSIS — C50919 Malignant neoplasm of unspecified site of unspecified female breast: Secondary | ICD-10-CM

## 2014-06-27 DIAGNOSIS — I2699 Other pulmonary embolism without acute cor pulmonale: Secondary | ICD-10-CM

## 2014-06-27 DIAGNOSIS — C187 Malignant neoplasm of sigmoid colon: Secondary | ICD-10-CM

## 2014-06-27 DIAGNOSIS — Z853 Personal history of malignant neoplasm of breast: Secondary | ICD-10-CM

## 2014-06-27 DIAGNOSIS — D500 Iron deficiency anemia secondary to blood loss (chronic): Secondary | ICD-10-CM

## 2014-06-27 DIAGNOSIS — N2889 Other specified disorders of kidney and ureter: Secondary | ICD-10-CM

## 2014-06-27 DIAGNOSIS — C189 Malignant neoplasm of colon, unspecified: Secondary | ICD-10-CM

## 2014-06-27 DIAGNOSIS — D259 Leiomyoma of uterus, unspecified: Secondary | ICD-10-CM

## 2014-06-27 DIAGNOSIS — N289 Disorder of kidney and ureter, unspecified: Secondary | ICD-10-CM

## 2014-06-27 DIAGNOSIS — E876 Hypokalemia: Secondary | ICD-10-CM

## 2014-06-27 LAB — COMPREHENSIVE METABOLIC PANEL (CC13)
ALBUMIN: 3.2 g/dL — AB (ref 3.5–5.0)
ALK PHOS: 41 U/L (ref 40–150)
ALT: 40 U/L (ref 0–55)
AST: 36 U/L — ABNORMAL HIGH (ref 5–34)
Anion Gap: 8 mEq/L (ref 3–11)
BUN: 8 mg/dL (ref 7.0–26.0)
CALCIUM: 9.8 mg/dL (ref 8.4–10.4)
CO2: 28 mEq/L (ref 22–29)
Chloride: 106 mEq/L (ref 98–109)
Creatinine: 0.9 mg/dL (ref 0.6–1.1)
GLUCOSE: 89 mg/dL (ref 70–140)
POTASSIUM: 3.6 meq/L (ref 3.5–5.1)
Sodium: 142 mEq/L (ref 136–145)
Total Bilirubin: 0.54 mg/dL (ref 0.20–1.20)
Total Protein: 7.8 g/dL (ref 6.4–8.3)

## 2014-06-27 LAB — CBC WITH DIFFERENTIAL/PLATELET
BASO%: 0.4 % (ref 0.0–2.0)
Basophils Absolute: 0 10*3/uL (ref 0.0–0.1)
EOS%: 0.9 % (ref 0.0–7.0)
Eosinophils Absolute: 0.1 10*3/uL (ref 0.0–0.5)
HCT: 40.6 % (ref 34.8–46.6)
HEMOGLOBIN: 13.6 g/dL (ref 11.6–15.9)
LYMPH#: 3.9 10*3/uL — AB (ref 0.9–3.3)
LYMPH%: 46.1 % (ref 14.0–49.7)
MCH: 30.1 pg (ref 25.1–34.0)
MCHC: 33.5 g/dL (ref 31.5–36.0)
MCV: 89.8 fL (ref 79.5–101.0)
MONO#: 0.6 10*3/uL (ref 0.1–0.9)
MONO%: 6.8 % (ref 0.0–14.0)
NEUT#: 3.9 10*3/uL (ref 1.5–6.5)
NEUT%: 45.8 % (ref 38.4–76.8)
Platelets: 247 10*3/uL (ref 145–400)
RBC: 4.52 10*6/uL (ref 3.70–5.45)
RDW: 16.4 % — ABNORMAL HIGH (ref 11.2–14.5)
WBC: 8.5 10*3/uL (ref 3.9–10.3)
nRBC: 0 % (ref 0–0)

## 2014-06-27 MED ORDER — LORAZEPAM 0.5 MG PO TABS: 0.5000 mg | ORAL_TABLET | Freq: Every day | ORAL | Status: DC

## 2014-06-27 MED ORDER — TRAMADOL HCL 50 MG PO TABS: 50.0000 mg | ORAL_TABLET | Freq: Two times a day (BID) | ORAL | Status: DC | PRN

## 2014-06-27 NOTE — Progress Notes (Signed)
Addendum:  1. Insomnia: she was given low dose lorazepam script today. 2. Pain Contral: Ultram prn and avoid NSAID's esp Motrin.

## 2014-06-27 NOTE — Telephone Encounter (Signed)
Pt confirmed labs/ov per 10/13 POF, gave pt AVS.... KJ °

## 2014-06-27 NOTE — Progress Notes (Signed)
[facility] OFF[facility] PROGRESS NOTE DATE OF VISIT: 06/27/2014  [name]., FNP [name] [place] [id number]  DIAGNOSIS: Colon cancer - Plan: CBC with Differential, Comprehensive metabolic panel (Cmet) - [facility], Lactate dehydrogenase (LDH) - [facility], CEA, NM PET Image Restag (PS) Skull Base To Thigh  Breast cancer, unspecified laterality - Plan: CA 27.29, NM PET Image Restag (PS) Skull Base To Thigh  Renal mass, right - Plan: MR Abdomen W Wo Contrast, NM PET Image Restag (PS) Skull Base To Thigh  Chief Complaint  Patient presents with  . Follow-up    CURRENT TREATMENT:  Xalreto 20 mg daily.   INTERVAL HISTORY:  Veronica Reyes 58 y.o. female with a history of breast cancer, now colon cancer s/p laparoscopic-assisted partial colectomy (03/09/2014), PE (01/06/2014) and history uterine fibroids admitted on 03/03/2014 with profound anemia (hgb 5.8).  She was discharged on 03/13/2014.  She is here for follow up.  She denies any evidence of uterine bleeding or hematochezia or melena.   One of patient's biggest complaint is insomnia and that she wakes frequently at night time. She also uses some pain medicines for the right flank pain as ibuprofen 800 mg dose. I told her not to take ibuprofen because of the interaction with Xarelto and possibility of GI ulcer as well as renal dysfunction. I will prescribe her some tramadol when necessary for pain and lorazepam low-dose for insomnia.  MEDICAL HISTORY: Past Medical History  Diagnosis Date  . Hypertension   . Cancer     left breast cancer x2  . Breast CA     radiation and surgery  . Shortness of breath     sob with exertion. dx. with Pulmonary emboli 4'15 ,tx. Xarelto,Lovenox used for [employer].  . Pulmonary embolism     "blood clot in lungs" -dx. 4'15 with CT Chest.    INTERIM HISTORY: has Pulmonary embolism; Hepatic lesion; Breast cancer; Essential hypertension, benign; Iron  deficiency anemia; Cancer of sigmoid colon s/p colectomy 03/09/2014; Menorrhagia with irregular cycle; Fibroids; E. coli UTI (urinary tract infection); H/O colon cancer, stage II; and Renal lesion on her problem list.    ALLERGIES:  has No Known Allergies.  MEDICATIONS: has a current medication list which includes the following prescription(s): diphenhydramine-acetaminophen, ferrous sulfate, hydrochlorothiazide, ibuprofen, metoprolol, multivitamin with minerals, rivaroxaban, lorazepam, and tramadol.  SURGICAL HISTORY:  Past Surgical History  Procedure Laterality Date  . Cesarean section    . Breast surgery      '11- Left breast lumpectomy  . Cholecystectomy      Laparoscopic 20 yrs ago  . Partial colectomy N/A 03/09/2014    Procedure: LAPAROSCOPIC ASSISTED PARTIAL COLECTOMY, splenic flexure mobilization;  Surgeon: Leighton Ruff, MD;  Location: [facility];  Service: General;  Laterality: N/A;    REVIEW OF SYSTEMS:   Constitutional: Denies fevers, chills or abnormal weight loss Eyes: Denies blurriness of vision Ears, nose, mouth, throat, and face: Denies mucositis or sore throat Respiratory: Denies cough, dyspnea or wheezes Cardiovascular: Denies palpitation, chest discomfort or lower extremity swelling Gastrointestinal:  Denies nausea, heartburn or change in bowel habits Skin: Denies abnormal skin rashes Lymphatics: Denies new lymphadenopathy or easy bruising Neurological:Denies numbness, tingling or new weaknesses Behavioral/Psych: Mood is stable, no new changes  All other systems were reviewed with the patient and are negative.  PHYSICAL EXAMINATION: ECOG PERFORMANCE STATUS: 0  Blood pressure 139/88, pulse 57, temperature 98.5 F (36.9 C), temperature source Oral, resp. rate 18, height _0  (1.676  m), weight 281 lb 12.8 oz (127.824 kg).  GENERAL:alert, no distress and comfortable; well developed and obese. Easily mobile to exam table.  SKIN: skin color, texture, turgor are normal,  no rashes or significant lesions EYES: normal, Conjunctiva are pink and non-injected, sclera clear OROPHARYNX:no exudate, no erythema and lips, buccal mucosa, and tongue normal  NECK: supple, thyroid normal size, non-tender, without nodularity LYMPH:  no palpable lymphadenopathy in the cervical, axillary or supraclavicular LUNGS: clear to auscultation with normal breathing effort, no wheezes or rhonchi HEART: regular rate & rhythm and no murmurs and no lower extremity edema ABDOMEN:abdomen soft, non-tender and normal bowel sounds; incision healing with signs of infection.  Musculoskeletal:no cyanosis of digits and no clubbing  NEURO: alert & oriented x 3 with fluent speech, no focal motor/sensory deficits  Labs:  Lab Results  Component Value Date   WBC 8.5 06/27/2014   HGB 13.6 06/27/2014   HCT 40.6 06/27/2014   MCV 89.8 06/27/2014   PLT 247 06/27/2014   NEUTROABS 3.9 06/27/2014      Chemistry      Component Value Date/Time   NA 142 06/27/2014 1001   NA 139 03/12/2014 0620   K 3.6 06/27/2014 1001   K 3.4* 03/12/2014 0620   CL 102 03/12/2014 0620   CO2 28 06/27/2014 1001   CO2 24 03/12/2014 0620   BUN 8.0 06/27/2014 1001   BUN 4* 03/12/2014 0620   CREATININE 0.9 06/27/2014 1001   CREATININE 0.65 03/12/2014 0620      Component Value Date/Time   CALCIUM 9.8 06/27/2014 1001   CALCIUM 8.6 03/12/2014 0620   ALKPHOS 41 06/27/2014 1001   ALKPHOS 38* 03/03/2014 1831   AST 36* 06/27/2014 1001   AST 37 03/03/2014 1831   ALT 40 06/27/2014 1001   ALT 30 03/03/2014 1831   BILITOT 0.54 06/27/2014 1001   BILITOT 0.2* 03/03/2014 1831       RADIOGRAPHIC STUDIES: Mr Abdomen W Wo Contrast (Reviewed personally by me)  03/06/2014   CLINICAL DATA:  Weakness. Anemia. History of breast cancer. Evaluate renal cyst.  EXAM: MRI ABDOMEN WITHOUT AND WITH CONTRAST  TECHNIQUE: Multiplanar multisequence MR imaging of the abdomen was performed both before and after the administration of intravenous  contrast.  CONTRAST:  29m MULTIHANCE GADOBENATE DIMEGLUMINE 529 MG/ML IV SOLN  COMPARISON:  CT of the abdomen and pelvis 03/04/2014.  FINDINGS: There are several lesions in the kidneys bilaterally which are low signal intensity on T1 weighted images, high signal intensity on T2 weighted images, and do not enhance, compatible with simple cysts. The largest simple cyst measures 2.9 cm in diameter in the medial aspect of the interpolar region of the left kidney. The largest renal lesion overall is in the interpolar region of the right kidney measuring 4.4 x 3.7 cm. This lesion is also generally low T1 signal intensity and high T2 signal intensity, without internal enhancement. However, there is some very subtle nodularity along the anterior margin of the lesion best appreciated on image 37 of series 8, which corresponds to some minimal enhancement on post gadolinium images, best appreciated on image 59 of series 1102 (this area of potential mural nodularity is favored to be normal adjacent renal parenchyma rather than a true mural nodule). This lesion also has multiple thin internal nonenhancing septations, which are best appreciated on coronal T2 weighted images (image 29 of series 9). Diffusion-weighted sequences demonstrate some diffusion restriction (lesion is dark on ADC map).  In addition, there is some amorphous  soft tissue located beneath segment 1 of the liver, posterior to the portal vein, anterior to the IVC and upper pole of the right kidney, posterior the second portion of the duodenum and immediately above and posterior to the head and uncinate process of the pancreas. This amorphous soft tissue is intimately associated with all adjacent structures, and is T2 hyperintense on respiratory triggered fat sat T2 weighted images (best appreciated on images 24-30 of series 4), hypointense spine T1 weighted images compared to normal pancreatic and hepatic parenchyma, demonstrates diffusion restriction (images 18  of series 3 and 21 of series 300), and demonstrates slightly differential enhancement when compared to either the hepatic or pancreatic parenchyma (best appreciated on subtraction image 37 of series 11102).  There are 2 tiny sub cm lesions in the liver which are low T1 signal intensity, high T2 signal intensity, and do not enhance, compatible with tiny simple cysts or biliary hamartomas. Status post cholecystectomy. The appearance of the spleen and bilateral adrenal glands is unremarkable.  IMPRESSION: 1. The lesion of concern in the lateral aspect of the interpolar region of the right kidney has indeterminate imaging characteristics, but is favored to be benign, and is currently classified as a Bosniak class IIF lesion. Repeat MRI with and without IV gadolinium in 6 months is recommended to ensure stability. This recommendation follows ACR consensus guidelines: Managing Incidental Findings on Abdominal CT: White Paper of the ACR Incidental Findings Committee. J Am Coll Radiol 2010;7:754-773. 2. In addition, there is some amorphous soft tissue in the portacaval and hepatoduodenal ligament regions which has indeterminate but potentially malignant imaging characteristics (with diffusion restriction). This is of uncertain etiology and significance, but could represent a lymphoma, or potentially nodal metastatic disease. Correlation with PET-CT is recommended to assess for hypermetabolism. Should this lesion prove to be hypermetabolic on PET-CT, further evaluation with endoscopic ultrasound and biopsy would be suggested. 3. Multiple additional simple renal cysts. These results will be called to the ordering clinician or representative by the Radiologist Assistant, and communication documented in the PACS or zVision Dashboard.   Electronically Signed   By: Vinnie Langton M.D.   On: 03/06/2014 08:57   US Transvaginal Non-ob  03/04/2014   CLINICAL DATA:  Vaginal bleeding  EXAM: TRANSABDOMINAL AND TRANSVAGINAL  ULTRASOUND OF PELVIS  TECHNIQUE: Both transabdominal and transvaginal ultrasound examinations of the pelvis were performed. Transabdominal technique was performed for global imaging of the pelvis including uterus, ovaries, adnexal regions, and pelvic cul-de-sac. It was necessary to proceed with endovaginal exam following the transabdominal exam to visualize the endometrium.  COMPARISON:  None  FINDINGS: Uterus  Measurements: 9.1 x 7.2 x 7.1 cm. Uterus is heterogeneous, with 3 mildly hypoechoic mass is identified consistent fibroids the largest measuring 2.6 cm. In the fundus there is an intramural fibroid measuring 2.2 cm. The 2.6 cm fibroid is seen in the subendometrial position in the anterior body of the uterus, with an adjacent 1.2 cm fibroid. The uterus is retroverted.  Endometrium  Thickness: 14 mm.  Heterogeneous echotexture  Right ovary  Measurements: 30 x 20 x 18 mm. Normal appearance/no adnexal mass.  Left ovary  Not visualized  Other findings  No free fluid.  IMPRESSION: 1. 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 2. Heterogeneous prominent endometrium which could be due to hyperplasia, polyp, or carcinoma.   Electronically Signed   By: Skipper Cliche M.D.   On: 03/04/2014 09:36  US Pelvis Complete  03/04/2014   CLINICAL DATA:  Vaginal bleeding  EXAM: TRANSABDOMINAL AND TRANSVAGINAL ULTRASOUND OF PELVIS  TECHNIQUE: Both transabdominal and transvaginal ultrasound examinations of the pelvis were performed. Transabdominal technique was performed for global imaging of the pelvis including uterus, ovaries, adnexal regions, and pelvic cul-de-sac. It was necessary to proceed with endovaginal exam following the transabdominal exam to visualize the endometrium.  COMPARISON:  None  FINDINGS: Uterus  Measurements: 9.1 x 7.2 x 7.1 cm. Uterus is heterogeneous, with 3 mildly hypoechoic mass is identified consistent fibroids the largest  measuring 2.6 cm. In the fundus there is an intramural fibroid measuring 2.2 cm. The 2.6 cm fibroid is seen in the subendometrial position in the anterior body of the uterus, with an adjacent 1.2 cm fibroid. The uterus is retroverted.  Endometrium  Thickness: 14 mm.  Heterogeneous echotexture  Right ovary  Measurements: 30 x 20 x 18 mm. Normal appearance/no adnexal mass.  Left ovary  Not visualized  Other findings  No free fluid.  IMPRESSION: 1. 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 2. Heterogeneous prominent endometrium which could be due to hyperplasia, polyp, or carcinoma.   Electronically Signed   By: Skipper Cliche M.D.   On: 03/04/2014 09:36   Ct Abdomen Pelvis W Contrast (Reviewed personally by me)  03/04/2014   CLINICAL DATA:  Generalized weakness, tiredness, tachycardia, anemia, bladder and bowels, menorrhagia, history breast cancer post radiation therapy, colon cancer pre colectomy, hypertension  EXAM: CT ABDOMEN AND PELVIS WITH CONTRAST  TECHNIQUE: Multidetector CT imaging of the abdomen and pelvis was performed using the standard protocol following bolus administration of intravenous contrast. Sagittal and coronal MPR images reconstructed from axial data set.  CONTRAST:  17m OMNIPAQUE IOHEXOL 300 MG/ML SOLN, 1092mOMNIPAQUE IOHEXOL 300 MG/ML SOLN  COMPARISON:  01/06/2014  FINDINGS: Lung bases clear.  Gallbladder surgically absent.  BILATERAL renal cysts, largest at RIGHT inferior pole questionably demonstrating mural nodularity, overall lesion 4.1 x 4.1 x 3.6 cm.  4 mm low-attenuation focus anteriorly in medial segment LEFT lobe liver image 21 unchanged.  Question new 6 mm low-attenuation nodule medial segment LEFT lobe liver image 21.  Remainder of liver, spleen, pancreas, kidneys, and adrenal glands normal appearance.  Central low attenuation within uterus could be related to phase of menses.  Unremarkable bladder, ureters,  and adnexae.  Normal appendix.  Mass at distal descending colon 3.8 x 4.1 cm image 59, approximately 5.8 cm length.  9 mm short axis lymph node in mesentery medial to the descending colon a previously 10 mm.  Stomach and bowel loops otherwise unremarkable.  No mass, adenopathy, free fluid or free air.  Mild scattered atherosclerotic calcification.  No acute osseous findings.  IMPRESSION: 3.8 x 4.1 x 5.8 cm diameter distal descending colon mass compatible with a primary colonic malignancy.  9 mm short axis lymph node in the adjacent colonic mesenteric.  BILATERAL renal cysts, largest of which at the RIGHT kidney shows questionable area of mural nodularity; followup nonemergent characterization of this lesion by MR imaging with and without contrast recommended to exclude cystic renal neoplasm.  Two nonspecific low-attenuation foci within liver.   Electronically Signed   By: MaLavonia Dana.D.   On: 03/04/2014 11:12   Dg Chest Port 1 View  03/03/2014   CLINICAL DATA:  Shortness of breath with exertion  EXAM: PORTABLE CHEST - 1 VIEW  COMPARISON:  None.  FINDINGS: Moderate  cardiac enlargement similar to prior study. Mild vascular congestion with no evidence of edema or consolidation.  IMPRESSION: Cardiac enlargement with minimal vascular congestion and no pulmonary edema   Electronically Signed   By: Skipper Cliche M.D.   On: 03/03/2014 19:07   PATHOLOGY:  Diagnosis 1. Colon, segmental resection for tumor, sigmoid - INVASIVE ADENOCARCINOMA, SEE COMMENT. - TUMOR EXTENDS INTO MUSCULARIS PROPRIA. - NEGATIVE FOR LYMPH VASCULAR INVASION. Diagnosis(continued) - NEGATIVE FOR PERINEURAL INVASION.- VISCERAL PERITONEUM, NEGATIVE FOR TUMOR.- FOURTEEN LYMPH NODES, NEGATIVE FOR TUMOR (0/14).- SURGICAL RESECTION MARGINS, NEGATIVE FOR TUMOR.- ENDOSCOPIC TATTOOING IDENTIFIED.- SEE TUMOR SYNOPTIC TEMPLATE BELOW. 2. Colon, resection margin (donut) - BENIGN COLON. - NEGATIVE FOR DYSPLASIA OR MALIGNANCY. Microscopic Comment  1. COLON AND RECTUM (INCLUDING TRANS-ANAL RESECTION): Specimen: Sigmoid colon. Procedure: Resection. Tumor site: Mesenteric-antimesenteric junction. Specimen integrity: Intact. Macroscopic intactness of mesorectum: Not applicable. Macroscopic tumor perforation: Absent. Invasive tumor: Maximum size: See comment. Histologic type(s): Adenocarcinoma. Histologic grade and differentiation: G1: well differentiated/low grade Type of polyp in which invasive carcinoma arose: Tubulovillous adenoma with high grade dysplasia. Microscopic extension of invasive tumor: Tumor focally extends into muscularis propria, see comment. Lymph-Vascular invasion: Absent. Peri-neural invasion: Absent. Tumor deposit(s) (discontinuous extramural extension): Absent. Resection margins: Proximal margin: Negative. Distal margin: Negative. Circumferential (radial) (posterior ascending, posterior descending; lateral and posterior mid-rectum; and entire lower 1/3 rectum): Negative.Mesenteric margin (sigmoid and transverse): Negative. Distance closest margin (if all above margins negative): Mesenteric margin (5 cm). Treatment effect (neo-adjuvant therapy): None. Additional polyp(s): None. Non-neoplastic findings: None. Lymph nodes: number examined 14; number positive: 0 Pathologic Staging: pT2, pN0, pMX Ancillary studies: Per the [place] gastrointestinal working group guidelines, tissue will be submitted for microsatellite instability by PCR as well as mismatch repair protein expression by immunohistochemistry. Comments: The mass is entirely submitted for review. There are foci of invasive adenocarcinoma arising in a background of a polypoid tubular adenoma with high grade dysplasia. There is prominent prolapsing of the muscularis propria. However, areas demonstrate definitive involvement of muscularis propria by invasive tumor (pT2). (CRR:ecj 03/14/2014) [place] RUND DO Pathologist, Electronic Signature (Case signed 03/14/2014)  Mismatch  Repair (MMR) Protein Immunohistochemistry (IHC)  IHC Expression Result:  MLH1: Preserved nuclear expression (greater 50% tumor expression)  MSH2: Preserved nuclear expression (greater 50% tumor expression)  MSH6: Preserved nuclear expression (greater 50% tumor expression)  PMS2: Preserved nuclear expression (greater 50% tumor expression)  * Internal control demonstrates intact nuclear expression  Interpretation: NORMAL  ASSESSMENT: Veronica Reyes 58 y.o. female with a history of  1.Colon Cancer, Stage I (pT2N0) -- Pathology reviewed and copies provided to patient. Patient had partial colectomy. No role for adjuvant chemotherapy. Colonoscopy per GI in one to three years per NCCN guidelines. She will need in June 2016 with Dr Benson [place].  2. History of PE (01/06/2014) --She has a diagnosis of pulmonary embolism, likely secondary to malignancy in the setting of family history for stroke (and possible stroke history in patient as well). Lower extremity dopplers negative. Treat for at least 6 months. Continue xalreto 20 mg daily.    3. History of Breast Cancer --Left CIS in situ, s/p lumpectomy in 2006 and 2011 by Dr. Margot Chimes, and radiation in 2011 by Dr. Sondra Come. Last mammogram on 01/09/14 negative   4. Anemia of acute blood blood lost secondary to #5 --Hbg is 13.6.  feraheme 1,020 mg given many months ago. Iron supplements recommended as needed. Continue ferrous sulfate 325 mg daily.    5. Uterine Fibroids  --Negative endometrial biopsy. Following with Gynecology.  6. Kidney lesion (Right). --Patient will require follow up MRI of the abdomen in six months around January 2015. I ordered a MRI.   7. Hypokalemia, mild. --Likely secondary to hydrochlorothiazide.  Potassium is low normal at 3.6 today.   8. Follow up. --Patient instructed to follow up in 3 months for labs and after having a PET scan and MRI ABDOMEN w and with out contrast. PET scan as she had some lymphadenopathy in abdomen  and whether it is a new problem, or recurrence of one of her primary cancers remain to be seen. it can also be a benign phenomenon and pet scan will help [place] determine that.   All questions were answered. The patient knows to call the clinic with any problems, questions or concerns. We can certainly see the patient much sooner if necessary.  I spent 25 minutes counseling the patient face to face. The total time spent in the appointment was 40 minutes.    Bernadene Bell, MD Medical Hematologist/Oncologist [place] Pager: [phone] Office No: [phone]

## 2014-07-17 ENCOUNTER — Encounter: Payer: Self-pay | Admitting: Hematology

## 2014-09-12 ENCOUNTER — Other Ambulatory Visit: Payer: Self-pay | Admitting: *Deleted

## 2014-09-12 DIAGNOSIS — I2699 Other pulmonary embolism without acute cor pulmonale: Secondary | ICD-10-CM

## 2014-09-12 MED ORDER — RIVAROXABAN 20 MG PO TABS: 20.0000 mg | ORAL_TABLET | Freq: Every day | ORAL | Status: DC

## 2014-09-12 NOTE — Telephone Encounter (Signed)
Talked with patient and apparently she has been prescribed a 15mg  dose from Dr. Ouida Sills in November instead of the 20mg  xarelto.  She has been taking it once a day.  Discussed with pharmacist and Dr. Burr Medico.  Will escribe correct dose of 20mg  daily for patient.  Called patient and let her know.

## 2014-09-13 ENCOUNTER — Telehealth: Payer: Self-pay | Admitting: Hematology

## 2014-09-13 NOTE — Telephone Encounter (Signed)
LM to confirm appt time for 10/09/14. Mailed cal.

## 2014-10-02 ENCOUNTER — Ambulatory Visit (HOSPITAL_COMMUNITY): Payer: Self-pay

## 2014-10-02 ENCOUNTER — Other Ambulatory Visit (HOSPITAL_COMMUNITY): Payer: Self-pay

## 2014-10-04 ENCOUNTER — Ambulatory Visit (HOSPITAL_COMMUNITY): Payer: 59

## 2014-10-04 ENCOUNTER — Encounter (HOSPITAL_COMMUNITY): Payer: 59

## 2014-10-05 ENCOUNTER — Other Ambulatory Visit: Payer: Self-pay | Admitting: *Deleted

## 2014-10-05 DIAGNOSIS — D509 Iron deficiency anemia, unspecified: Secondary | ICD-10-CM

## 2014-10-05 DIAGNOSIS — C50919 Malignant neoplasm of unspecified site of unspecified female breast: Secondary | ICD-10-CM

## 2014-10-09 ENCOUNTER — Ambulatory Visit: Payer: Self-pay

## 2014-10-09 ENCOUNTER — Other Ambulatory Visit: Payer: Self-pay

## 2014-10-09 ENCOUNTER — Ambulatory Visit: Payer: Self-pay | Admitting: Hematology

## 2014-10-09 ENCOUNTER — Telehealth: Payer: Self-pay | Admitting: Hematology

## 2014-10-09 NOTE — Telephone Encounter (Signed)
, °

## 2014-10-20 ENCOUNTER — Telehealth: Payer: Self-pay | Admitting: Hematology

## 2014-10-20 ENCOUNTER — Other Ambulatory Visit (HOSPITAL_BASED_OUTPATIENT_CLINIC_OR_DEPARTMENT_OTHER): Payer: 59

## 2014-10-20 ENCOUNTER — Ambulatory Visit (HOSPITAL_BASED_OUTPATIENT_CLINIC_OR_DEPARTMENT_OTHER): Payer: 59 | Admitting: Hematology

## 2014-10-20 ENCOUNTER — Encounter: Payer: Self-pay | Admitting: Hematology

## 2014-10-20 VITALS — BP 147/86 | HR 60 | Temp 97.8°F | Resp 20 | Ht 66.0 in | Wt 279.2 lb

## 2014-10-20 DIAGNOSIS — C187 Malignant neoplasm of sigmoid colon: Secondary | ICD-10-CM

## 2014-10-20 DIAGNOSIS — G4700 Insomnia, unspecified: Secondary | ICD-10-CM

## 2014-10-20 DIAGNOSIS — C50919 Malignant neoplasm of unspecified site of unspecified female breast: Secondary | ICD-10-CM

## 2014-10-20 DIAGNOSIS — Z853 Personal history of malignant neoplasm of breast: Secondary | ICD-10-CM

## 2014-10-20 DIAGNOSIS — C50912 Malignant neoplasm of unspecified site of left female breast: Secondary | ICD-10-CM

## 2014-10-20 DIAGNOSIS — E876 Hypokalemia: Secondary | ICD-10-CM

## 2014-10-20 DIAGNOSIS — D509 Iron deficiency anemia, unspecified: Secondary | ICD-10-CM

## 2014-10-20 DIAGNOSIS — Z86711 Personal history of pulmonary embolism: Secondary | ICD-10-CM

## 2014-10-20 DIAGNOSIS — I2699 Other pulmonary embolism without acute cor pulmonale: Secondary | ICD-10-CM

## 2014-10-20 LAB — CBC WITH DIFFERENTIAL/PLATELET
BASO%: 0.4 % (ref 0.0–2.0)
BASOS ABS: 0 10*3/uL (ref 0.0–0.1)
EOS ABS: 0.1 10*3/uL (ref 0.0–0.5)
EOS%: 1.2 % (ref 0.0–7.0)
HEMATOCRIT: 43.1 % (ref 34.8–46.6)
HEMOGLOBIN: 14.5 g/dL (ref 11.6–15.9)
LYMPH%: 42.1 % (ref 14.0–49.7)
MCH: 31.5 pg (ref 25.1–34.0)
MCHC: 33.6 g/dL (ref 31.5–36.0)
MCV: 93.5 fL (ref 79.5–101.0)
MONO#: 0.5 10*3/uL (ref 0.1–0.9)
MONO%: 6.2 % (ref 0.0–14.0)
NEUT#: 3.6 10*3/uL (ref 1.5–6.5)
NEUT%: 50.1 % (ref 38.4–76.8)
NRBC: 0 % (ref 0–0)
Platelets: 252 10*3/uL (ref 145–400)
RBC: 4.61 10*6/uL (ref 3.70–5.45)
RDW: 13.8 % (ref 11.2–14.5)
WBC: 7.3 10*3/uL (ref 3.9–10.3)
lymph#: 3.1 10*3/uL (ref 0.9–3.3)

## 2014-10-20 LAB — COMPREHENSIVE METABOLIC PANEL (CC13)
ALT: 44 U/L (ref 0–55)
ANION GAP: 11 meq/L (ref 3–11)
AST: 52 U/L — ABNORMAL HIGH (ref 5–34)
Albumin: 3.4 g/dL — ABNORMAL LOW (ref 3.5–5.0)
Alkaline Phosphatase: 49 U/L (ref 40–150)
BUN: 12.5 mg/dL (ref 7.0–26.0)
CO2: 29 mEq/L (ref 22–29)
Calcium: 9.4 mg/dL (ref 8.4–10.4)
Chloride: 103 mEq/L (ref 98–109)
Creatinine: 0.9 mg/dL (ref 0.6–1.1)
EGFR: 82 mL/min/{1.73_m2} — ABNORMAL LOW (ref >90)
Glucose: 104 mg/dl (ref 70–140)
Potassium: 3.3 mEq/L — ABNORMAL LOW (ref 3.5–5.1)
SODIUM: 142 meq/L (ref 136–145)
TOTAL PROTEIN: 7.9 g/dL (ref 6.4–8.3)
Total Bilirubin: 0.59 mg/dL (ref 0.20–1.20)

## 2014-10-20 LAB — LACTATE DEHYDROGENASE (CC13): LDH: 163 U/L (ref 125–245)

## 2014-10-20 LAB — CANCER ANTIGEN 27.29: CA 27.29: 15 U/mL (ref 0–39)

## 2014-10-20 MED ORDER — POTASSIUM CHLORIDE CRYS ER 20 MEQ PO TBCR: 20.0000 meq | EXTENDED_RELEASE_TABLET | Freq: Two times a day (BID) | ORAL | Status: DC

## 2014-10-20 MED ORDER — ZOLPIDEM TARTRATE 5 MG PO TABS: 5.0000 mg | ORAL_TABLET | Freq: Every evening | ORAL | Status: DC | PRN

## 2014-10-20 NOTE — Telephone Encounter (Signed)
gv and printed appt sched and avs pt for Feb

## 2014-10-20 NOTE — Progress Notes (Signed)
[facility] PROGRESS NOTE DATE OF VISIT: 06/27/2014  [name]., FNP [name] 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 Left breast DCIS diagnosed in 2006 and 2010, status post lumpectomy. 2. pT2N0M0 stage I colon cancer, s/p partial colectomy on 03/09/2014  3. PE in 12/2013 when she was diagnosed with colon cancer.  CURRENT TREATMENT:  Xalreto 20 mg daily.   INTERVAL HISTORY:  Veronica Reyes 59 y.o. female with a history of breast cancer, colon cancer s/p laparoscopic-assisted partial colectomy (03/09/2014), PE (01/06/2014) , is here for follow up. She was last seen by Dr. Lona Kettle by in October 2015 who has left the practice.    She came in with her husband today. She is doing well overall. She has intermittent right flank pain , especially when she changes position or turns in bed at night.  She has been taking Flexeril, but did not like it due to the drowsiness. She has insomnia, with a like to have a sleep pill. She otherwise denies any bleedings, no other new pain, abdominal discomfort, or other new complaints. She has successfully lost about 30 pounds in the past several months.   MEDICAL HISTORY: Past Medical History  Diagnosis Date  . Hypertension   . Cancer     left breast cancer x2  . Breast CA     radiation and surgery  . Shortness of breath     sob with exertion. dx. with Pulmonary emboli 4'15 ,tx. Xarelto,Lovenox used for [employer].  . Pulmonary embolism     "blood clot in lungs" -dx. 4'15 with CT Chest.    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 coli UTI (urinary tract infection); H/O colon cancer, stage II; and Renal lesion on her problem list.    ALLERGIES:  is allergic to tramadol.  MEDICATIONS:    Medication List       This list is accurate as of: 10/20/14  5:46 PM.  Always use your most recent med list.               ferrous sulfate 325 (65 FE) MG tablet  Take 325 mg by mouth every morning.     hydrochlorothiazide 25 MG tablet  Commonly known as:  HYDRODIURIL  Take 25 mg by mouth every morning.     LORazepam 0.5 MG tablet  Commonly known as:  ATIVAN  Take 1 tablet (0.5 mg total) by mouth at bedtime.     metoprolol 50 MG tablet  Commonly known as:  LOPRESSOR  Take 50 mg by mouth 2 (two) times daily.     potassium chloride SA 20 MEQ tablet  Commonly known as:  K-DUR,KLOR-CON  Take 1 tablet (20 mEq total) by mouth 2 (two) times daily.     rivaroxaban 20 MG Tabs tablet  Commonly known as:  XARELTO  Take 1 tablet (20 mg total) by mouth daily with supper.     zolpidem 5 MG tablet  Commonly known as:  AMBIEN  Take 1 tablet (5 mg total) by mouth at bedtime as needed for sleep.        SURGICAL HISTORY:  Past  Surgical History  Procedure Laterality Date  . Cesarean section    . Breast surgery      '11- Left breast lumpectomy  . Cholecystectomy      Laparoscopic 20 yrs ago  . Partial colectomy N/A 03/09/2014    Procedure: LAPAROSCOPIC ASSISTED PARTIAL COLECTOMY, splenic flexure mobilization;  Surgeon: Leighton Ruff, MD;  Location: [facility];  Service: General;  Laterality: N/A;    REVIEW OF SYSTEMS:   Constitutional: Denies fevers, chills or abnormal weight loss Eyes: Denies blurriness of vision Ears, nose, mouth, throat, and face: Denies mucositis or sore throat Respiratory: Denies cough, dyspnea or wheezes Cardiovascular: Denies palpitation, chest discomfort or lower extremity swelling Gastrointestinal:  Denies nausea, heartburn or change in bowel habits Skin: Denies  abnormal skin rashes Lymphatics: Denies new lymphadenopathy or easy bruising Neurological:Denies numbness, tingling or new weaknesses Behavioral/Psych: Mood is stable, no new changes  All other systems were reviewed with the patient and are negative.  PHYSICAL EXAMINATION: ECOG PERFORMANCE STATUS: 0  Blood pressure 147/86, pulse 60, temperature 97.8 F (36.6 C), temperature source Oral, resp. rate 20, height '5\' 6"'  (1.676 m), weight 279 lb 3.2 oz (126.644 kg), SpO2 100 %.  GENERAL:alert, no distress and comfortable; well developed and obese. Easily mobile to exam table.  SKIN: skin color, texture, turgor are normal, no rashes or significant lesions EYES: normal, Conjunctiva are pink and non-injected, sclera clear OROPHARYNX:no exudate, no erythema and lips, buccal mucosa, and tongue normal  NECK: supple, thyroid normal size, non-tender, without nodularity LYMPH:  no palpable lymphadenopathy in the cervical, axillary or supraclavicular LUNGS: clear to auscultation with normal breathing effort, no wheezes or rhonchi HEART: regular rate & rhythm and no murmurs and no lower extremity edema ABDOMEN:abdomen soft, non-tender and normal bowel sounds; incision healing with signs of infection.  Musculoskeletal:no cyanosis of digits and no clubbing  NEURO: alert & oriented x 3 with fluent speech, no focal motor/sensory deficits  Labs:  Lab Results  Component Value Date   WBC 7.3 10/20/2014   HGB 14.5 10/20/2014   HCT 43.1 10/20/2014   MCV 93.5 10/20/2014   PLT 252 10/20/2014   NEUTROABS 3.6 10/20/2014      Chemistry      Component Value Date/Time   NA 142 10/20/2014 0952   NA 139 03/12/2014 0620   K 3.3* 10/20/2014 0952   K 3.4* 03/12/2014 0620   CL 102 03/12/2014 0620   CO2 29 10/20/2014 0952   CO2 24 03/12/2014 0620   BUN 12.5 10/20/2014 0952   BUN 4* 03/12/2014 0620   CREATININE 0.9 10/20/2014 0952   CREATININE 0.65 03/12/2014 0620      Component Value Date/Time   CALCIUM  9.4 10/20/2014 0952   CALCIUM 8.6 03/12/2014 0620   ALKPHOS 49 10/20/2014 0952   ALKPHOS 38* 03/03/2014 1831   AST 52* 10/20/2014 0952   AST 37 03/03/2014 1831   ALT 44 10/20/2014 0952   ALT 30 03/03/2014 1831   BILITOT 0.59 10/20/2014 0952   BILITOT 0.2* 03/03/2014 1831       RADIOGRAPHIC STUDIES: Mr Abdomen W Wo Contrast  03/06/2014    IMPRESSION: 1. The lesion of concern in the lateral aspect of the interpolar region of the right kidney has indeterminate imaging characteristics, but is favored to be benign, and is currently classified as a Bosniak class IIF lesion. Repeat MRI with and without IV gadolinium in 6 months is recommended to ensure stability. This recommendation follows ACR consensus guidelines: Managing Incidental Findings on  Abdominal CT: White Paper of the ACR Incidental Findings Committee. J Am Coll Radiol 2010;7:754-773. 2. In addition, there is some amorphous soft tissue in the portacaval and hepatoduodenal ligament regions which has indeterminate but potentially malignant imaging characteristics (with diffusion restriction). This is of uncertain etiology and significance, but could represent a lymphoma, or potentially nodal metastatic disease. Correlation with PET-CT is recommended to assess for hypermetabolism. Should this lesion prove to be hypermetabolic on PET-CT, further evaluation with endoscopic ultrasound and biopsy would be suggested. 3. Multiple additional simple renal cysts. These results will be called to the ordering clinician or representative by the Radiologist Assistant, and communication documented in the PACS or zVision Dashboard.   Electronically Signed   By: Vinnie Langton M.D.   On: 03/06/2014 08:57    Ct Abdomen Pelvis W Contrast 03/04/2014  IMPRESSION: 3.8 x 4.1 x 5.8 cm diameter distal descending colon mass compatible with a primary colonic malignancy.  9 mm short axis lymph node in the adjacent colonic mesenteric.  BILATERAL renal cysts, largest of  which at the RIGHT kidney shows questionable area of mural nodularity; followup nonemergent characterization of this lesion by MR imaging with and without contrast recommended to exclude cystic renal neoplasm.  Two nonspecific low-attenuation foci within liver.   Electronically Signed   By: Lavonia Dana M.D.   On: 03/04/2014 11:12   PATHOLOGY:  Diagnosis 1. Colon, segmental resection for tumor, sigmoid - INVASIVE ADENOCARCINOMA, SEE COMMENT. - TUMOR EXTENDS INTO MUSCULARIS PROPRIA. - NEGATIVE FOR LYMPH VASCULAR INVASION. Diagnosis(continued) - NEGATIVE FOR PERINEURAL INVASION.- VISCERAL PERITONEUM, NEGATIVE FOR TUMOR.- FOURTEEN LYMPH NODES, NEGATIVE FOR TUMOR (0/14).- SURGICAL RESECTION MARGINS, NEGATIVE FOR TUMOR.- ENDOSCOPIC TATTOOING IDENTIFIED.- SEE TUMOR SYNOPTIC TEMPLATE BELOW. 2. Colon, resection margin (donut) - BENIGN COLON. - NEGATIVE FOR DYSPLASIA OR MALIGNANCY. Microscopic Comment 1. COLON AND RECTUM (INCLUDING TRANS-ANAL RESECTION): Specimen: Sigmoid colon. Procedure: Resection. Tumor site: Mesenteric-antimesenteric junction. Specimen integrity: Intact. Macroscopic intactness of mesorectum: Not applicable. Macroscopic tumor perforation: Absent. Invasive tumor: Maximum size: See comment. Histologic type(s): Adenocarcinoma. Histologic grade and differentiation: G1: well differentiated/low grade Type of polyp in which invasive carcinoma arose: Tubulovillous adenoma with high grade dysplasia. Microscopic extension of invasive tumor: Tumor focally extends into muscularis propria, see comment. Lymph-Vascular invasion: Absent. Peri-neural invasion: Absent. Tumor deposit(s) (discontinuous extramural extension): Absent. Resection margins: Proximal margin: Negative. Distal margin: Negative. Circumferential (radial) (posterior ascending, posterior descending; lateral and posterior mid-rectum; and entire lower 1/3 rectum): Negative.Mesenteric margin (sigmoid and transverse): Negative. Distance  closest margin (if all above margins negative): Mesenteric margin (5 cm). Treatment effect (neo-adjuvant therapy): None. Additional polyp(s): None. Non-neoplastic findings: None. Lymph nodes: number examined 14; number positive: 0 Pathologic Staging: pT2, pN0, pMX Ancillary studies: Per the [place] gastrointestinal working group guidelines, tissue will be submitted for microsatellite instability by PCR as well as mismatch repair protein expression by immunohistochemistry. Comments: The mass is entirely submitted for review. There are foci of invasive adenocarcinoma arising in a background of a polypoid tubular adenoma with high grade dysplasia. There is prominent prolapsing of the muscularis propria. However, areas demonstrate definitive involvement of muscularis propria by invasive tumor (pT2). (CRR:ecj 03/14/2014) [place]i RUND DO Pathologist, Electronic Signature (Case signed 03/14/2014)  Mismatch Repair (MMR) Protein Immunohistochemistry (IHC)  IHC Expression Result:  MLH1: Preserved nuclear expression (greater 50% tumor expression)  MSH2: Preserved nuclear expression (greater 50% tumor expression)  MSH6: Preserved nuclear expression (greater 50% tumor expression)  PMS2: Preserved nuclear expression (greater 50% tumor expression)  * Internal control demonstrates intact  nuclear expression  Interpretation: NORMAL  ASSESSMENT: Veronica Reyes 59 y.o. female with a history of  1.Colon Cancer, Stage I (pT2N0) -- She is clinically doing well, physical exam was unremarkable.  -Continue surveillance.  -She will have a colonoscopy in June 2016 with Dr Benson Norway. -Given abnormal CT and MRI of abdomen in July 2016, Dr. Lona Kettle ordered a PET/CT, which was recently approved by her insurance. I will get a scheduled and done in the next months.  2. History of PE (01/06/2014) --She has a diagnosis of pulmonary embolism, likely secondary to malignancy in the setting of family history for stroke (and  possible stroke history in patient as well). Lower extremity dopplers negative.  -She has been on Xarelto for 10 months now, if her PET scan reviewed his no evidence of cancer recurrence, I think she can come off anticoagulation.  3. History of Breast Cancer --Left CIS in situ, s/p lumpectomy in 2006 and 2011 by Dr. Margot Chimes, and radiation in 2011 by Dr. Sondra Come. Last mammogram on 01/09/14 negative -Continue yearly mammogram and a physical exam.   4.  Uterine Fibroids  --Negative endometrial biopsy. Following with Gynecology.  6. Kidney lesion (Right). --Patient will require follow up  Scan. I'll see what the PET scan shows, then decide if abdominal MRI is needed  7. Hypokalemia, mild. --Likely secondary to hydrochlorothiazide.  Potassium is low normal at 3.6 today.   8. Follow up. -Follow up in one months to review her PET scan findings.  All questions were answered. The patient knows to call the clinic with any problems, questions or concerns. We can certainly see the patient much sooner if necessary.  I spent 25 minutes counseling the patient face to face. The total time spent in the appointment was 30 minutes.   [name]  10/20/2014

## 2014-10-25 ENCOUNTER — Telehealth: Payer: Self-pay | Admitting: *Deleted

## 2014-10-25 NOTE — Telephone Encounter (Signed)
Patient called reporting her scans cannot be done until 11-02-2014.  "I need to reschedule F/U after the scans."  Informed patient F/U has been changed to 11-08-2014.

## 2014-11-01 ENCOUNTER — Other Ambulatory Visit: Payer: 59

## 2014-11-02 ENCOUNTER — Ambulatory Visit (HOSPITAL_COMMUNITY): Admission: RE | Admit: 2014-11-02 | Payer: 59 | Source: Ambulatory Visit

## 2014-11-08 ENCOUNTER — Telehealth: Payer: Self-pay | Admitting: *Deleted

## 2014-11-08 ENCOUNTER — Ambulatory Visit: Payer: 59 | Admitting: Hematology

## 2014-11-08 ENCOUNTER — Other Ambulatory Visit: Payer: Self-pay | Admitting: *Deleted

## 2014-11-08 ENCOUNTER — Telehealth: Payer: Self-pay | Admitting: Hematology

## 2014-11-08 NOTE — Telephone Encounter (Signed)
s.w. pt and confirmed appt moved after PET...pt ok and aware of new d.t

## 2014-11-08 NOTE — Telephone Encounter (Signed)
Called pt d/t missed appt today.  She reports that she missed her PET Scan & r/s to 11/10/14 & thought her MD appt was r/s also.  Informed that we would get her r/s to see Dr Burr Medico & someone would call her back.

## 2014-11-10 ENCOUNTER — Ambulatory Visit (HOSPITAL_COMMUNITY)
Admission: RE | Admit: 2014-11-10 | Discharge: 2014-11-10 | Disposition: A | Discharge status: Home / Self Care | Payer: 59 | Source: Ambulatory Visit | Attending: Hematology | Admitting: Hematology

## 2014-11-10 DIAGNOSIS — C187 Malignant neoplasm of sigmoid colon: Secondary | ICD-10-CM | POA: Insufficient documentation

## 2014-11-10 LAB — GLUCOSE, CAPILLARY: GLUCOSE-CAPILLARY: 90 mg/dL (ref 70–99)

## 2014-11-10 MED ORDER — FLUDEOXYGLUCOSE F - 18 (FDG) INJECTION
14.0000 | Freq: Once | INTRAVENOUS | Status: AC | PRN
  Administered 2014-11-10: 14 via INTRAVENOUS

## 2014-11-17 ENCOUNTER — Telehealth: Payer: Self-pay | Admitting: Hematology

## 2014-11-17 ENCOUNTER — Ambulatory Visit (HOSPITAL_BASED_OUTPATIENT_CLINIC_OR_DEPARTMENT_OTHER): Payer: 59

## 2014-11-17 ENCOUNTER — Ambulatory Visit (HOSPITAL_BASED_OUTPATIENT_CLINIC_OR_DEPARTMENT_OTHER): Payer: 59 | Admitting: Hematology

## 2014-11-17 VITALS — BP 136/86 | HR 64 | Temp 98.4°F | Resp 18 | Ht 66.0 in | Wt 283.4 lb

## 2014-11-17 DIAGNOSIS — C187 Malignant neoplasm of sigmoid colon: Secondary | ICD-10-CM

## 2014-11-17 DIAGNOSIS — Z853 Personal history of malignant neoplasm of breast: Secondary | ICD-10-CM

## 2014-11-17 DIAGNOSIS — D509 Iron deficiency anemia, unspecified: Secondary | ICD-10-CM

## 2014-11-17 DIAGNOSIS — Z86711 Personal history of pulmonary embolism: Secondary | ICD-10-CM

## 2014-11-17 LAB — CBC & DIFF AND RETIC
BASO%: 0.3 % (ref 0.0–2.0)
BASOS ABS: 0 10*3/uL (ref 0.0–0.1)
EOS%: 1.2 % (ref 0.0–7.0)
Eosinophils Absolute: 0.1 10*3/uL (ref 0.0–0.5)
HCT: 43.2 % (ref 34.8–46.6)
HEMOGLOBIN: 14.5 g/dL (ref 11.6–15.9)
IMMATURE RETIC FRACT: 5.3 % (ref 1.60–10.00)
LYMPH%: 39 % (ref 14.0–49.7)
MCH: 31.4 pg (ref 25.1–34.0)
MCHC: 33.6 g/dL (ref 31.5–36.0)
MCV: 93.5 fL (ref 79.5–101.0)
MONO#: 0.6 10*3/uL (ref 0.1–0.9)
MONO%: 6.3 % (ref 0.0–14.0)
NEUT%: 53.2 % (ref 38.4–76.8)
NEUTROS ABS: 5 10*3/uL (ref 1.5–6.5)
Platelets: 254 10*3/uL (ref 145–400)
RBC: 4.62 10*6/uL (ref 3.70–5.45)
RDW: 14.1 % (ref 11.2–14.5)
Retic %: 2.19 % — ABNORMAL HIGH (ref 0.70–2.10)
Retic Ct Abs: 101.18 10*3/uL — ABNORMAL HIGH (ref 33.70–90.70)
WBC: 9.4 10*3/uL (ref 3.9–10.3)
lymph#: 3.7 10*3/uL — ABNORMAL HIGH (ref 0.9–3.3)
nRBC: 0 % (ref 0–0)

## 2014-11-17 LAB — CEA: CEA: 1.2 ng/mL (ref 0.0–5.0)

## 2014-11-17 LAB — IRON AND TIBC CHCC
%SAT: 24 % (ref 21–57)
Iron: 80 ug/dL (ref 41–142)
TIBC: 338 ug/dL (ref 236–444)
UIBC: 258 ug/dL (ref 120–384)

## 2014-11-17 LAB — COMPREHENSIVE METABOLIC PANEL (CC13)
ALK PHOS: 45 U/L (ref 40–150)
ALT: 31 U/L (ref 0–55)
AST: 30 U/L (ref 5–34)
Albumin: 3.5 g/dL (ref 3.5–5.0)
Anion Gap: 11 mEq/L (ref 3–11)
BUN: 11.1 mg/dL (ref 7.0–26.0)
CO2: 24 mEq/L (ref 22–29)
Calcium: 9.8 mg/dL (ref 8.4–10.4)
Chloride: 108 mEq/L (ref 98–109)
Creatinine: 0.8 mg/dL (ref 0.6–1.1)
Glucose: 93 mg/dl (ref 70–140)
Potassium: 4 mEq/L (ref 3.5–5.1)
Sodium: 143 mEq/L (ref 136–145)
TOTAL PROTEIN: 7.9 g/dL (ref 6.4–8.3)
Total Bilirubin: 0.47 mg/dL (ref 0.20–1.20)

## 2014-11-17 LAB — FERRITIN CHCC: Ferritin: 93 ng/ml (ref 9–269)

## 2014-11-17 NOTE — Telephone Encounter (Signed)
gv adn rpinted appt sched and avs for pt for March...sent pt to lab

## 2014-11-17 NOTE — Progress Notes (Signed)
[facility] PROGRESS NOTE DATE OF VISIT: 06/27/2014  [name]., FNP [address] 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 Left breast DCIS diagnosed in 2006 and 2010, status post lumpectomy. 2. pT2N0M0 stage I colon cancer, s/p partial colectomy on 03/09/2014  3. PE in 12/2013 when she was diagnosed with colon cancer.  CURRENT TREATMENT:  Xalreto 20 mg daily.   INTERVAL HISTORY:  Veronica Reyes 59 y.o. female with a history of breast cancer, colon cancer s/p laparoscopic-assisted partial colectomy (03/09/2014), PE (01/06/2014) , is here for follow up. She was last seen by me one month ago. She is here to discuss her restaging PET scan.  She still has intermittent right frank pain since the surgery last summer. She takes tylenol 2 tab daily for this. No other new pain, nausea, or other complains. She has mild diarrhea after certain greasy food. She has good appetite and eats well, weight stable.    MEDICAL HISTORY: Past Medical History  Diagnosis Date  . Hypertension   . Cancer     left breast cancer x2  . Breast CA     radiation and surgery  . Shortness of breath     sob with exertion. dx. with Pulmonary emboli 4'15 ,tx. Xarelto,Lovenox used for [employer].  . Pulmonary embolism     "blood clot in lungs" -dx. 4'15 with CT Chest.    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 coli UTI (urinary tract infection); H/O colon cancer, stage II; and Renal lesion on her problem list.    ALLERGIES:  is allergic to tramadol.  MEDICATIONS:    Medication List       This list is accurate  as of: 11/17/14 11:59 PM.  Always use your most recent med list.               amLODipine 2.5 MG tablet  Commonly known as:  NORVASC  Take 2.5 mg by mouth daily.     ferrous sulfate 325 (65 FE) MG tablet  Take 325 mg by mouth every morning.     hydrochlorothiazide 25 MG tablet  Commonly known as:  HYDRODIURIL  Take 25 mg by mouth every morning.     LORazepam 0.5 MG tablet  Commonly known as:  ATIVAN  Take 1 tablet (0.5 mg total) by mouth at bedtime.     metoprolol 50 MG tablet  Commonly known as:  LOPRESSOR  Take 50 mg by mouth 2 (two) times daily.     potassium chloride SA 20 MEQ tablet  Commonly known as:  K-DUR,KLOR-CON  Take 1 tablet (20 mEq total) by mouth 2 (two) times daily.     rivaroxaban 20 MG Tabs tablet  Commonly known as:  XARELTO  Take 1 tablet (20 mg total) by mouth daily with supper.     zolpidem 5 MG tablet  Commonly known as:  AMBIEN  Take 1 tablet (5 mg total) by mouth at bedtime as needed for sleep.        SURGICAL HISTORY:  Past Surgical History  Procedure Laterality Date  . Cesarean section    . Breast surgery      '11- Left breast lumpectomy  .  Cholecystectomy      Laparoscopic 20 yrs ago  . Partial colectomy N/A 03/09/2014    Procedure: LAPAROSCOPIC ASSISTED PARTIAL COLECTOMY, splenic flexure mobilization;  Surgeon: Leighton Ruff, MD;  Location: [facility];  Service: General;  Laterality: N/A;    REVIEW OF SYSTEMS:   Constitutional: Denies fevers, chills or abnormal weight loss Eyes: Denies blurriness of vision Ears, nose, mouth, throat, and face: Denies mucositis or sore throat Respiratory: Denies cough, dyspnea or wheezes Cardiovascular: Denies palpitation, chest discomfort or lower extremity swelling Gastrointestinal:  Denies nausea, heartburn or change in bowel habits Skin: Denies abnormal skin rashes Lymphatics: Denies new lymphadenopathy or easy bruising Neurological:Denies numbness, tingling or new weaknesses Behavioral/Psych: Mood  is stable, no new changes  All other systems were reviewed with the patient and are negative.  PHYSICAL EXAMINATION: ECOG PERFORMANCE STATUS: 0  Blood pressure 136/86, pulse 64, temperature 98.4 F (36.9 C), temperature source Oral, resp. rate 18, height '5\' 6"'  (1.676 m), weight 283 lb 6.4 oz (128.549 kg), last menstrual period 01/23/2014, SpO2 100 %.  GENERAL:alert, no distress and comfortable; well developed and obese. Easily mobile to exam table.  SKIN: skin color, texture, turgor are normal, no rashes or significant lesions EYES: normal, Conjunctiva are pink and non-injected, sclera clear OROPHARYNX:no exudate, no erythema and lips, buccal mucosa, and tongue normal  NECK: supple, thyroid normal size, non-tender, without nodularity LYMPH:  no palpable lymphadenopathy in the cervical, axillary or supraclavicular LUNGS: clear to auscultation with normal breathing effort, no wheezes or rhonchi HEART: regular rate & rhythm and no murmurs and no lower extremity edema ABDOMEN:abdomen soft, non-tender and normal bowel sounds; incision healing with signs of infection. Rectal exam negative for palpable lesion, no blood on glove  Musculoskeletal:no cyanosis of digits and no clubbing  NEURO: alert & oriented x 3 with fluent speech, no focal motor/sensory deficits  Labs:  Lab Results  Component Value Date   WBC 9.4 11/17/2014   HGB 14.5 11/17/2014   HCT 43.2 11/17/2014   MCV 93.5 11/17/2014   PLT 254 11/17/2014   NEUTROABS 5.0 11/17/2014      Chemistry      Component Value Date/Time   NA 143 11/17/2014 1348   NA 139 03/12/2014 0620   K 4.0 11/17/2014 1348   K 3.4* 03/12/2014 0620   CL 102 03/12/2014 0620   CO2 24 11/17/2014 1348   CO2 24 03/12/2014 0620   BUN 11.1 11/17/2014 1348   BUN 4* 03/12/2014 0620   CREATININE 0.8 11/17/2014 1348   CREATININE 0.65 03/12/2014 0620      Component Value Date/Time   CALCIUM 9.8 11/17/2014 1348   CALCIUM 8.6 03/12/2014 0620   ALKPHOS 45  11/17/2014 1348   ALKPHOS 38* 03/03/2014 1831   AST 30 11/17/2014 1348   AST 37 03/03/2014 1831   ALT 31 11/17/2014 1348   ALT 30 03/03/2014 1831   BILITOT 0.47 11/17/2014 1348   BILITOT 0.2* 03/03/2014 1831       RADIOGRAPHIC STUDIES: Mr Abdomen W Wo Contrast  03/06/2014    IMPRESSION: 1. The lesion of concern in the lateral aspect of the interpolar region of the right kidney has indeterminate imaging characteristics, but is favored to be benign, and is currently classified as a Bosniak class IIF lesion. Repeat MRI with and without IV gadolinium in 6 months is recommended to ensure stability. This recommendation follows ACR consensus guidelines: Managing Incidental Findings on Abdominal CT: White Paper of the ACR Incidental Findings Committee. J Am Coll  Radiol 2010;7:754-773. 2. In addition, there is some amorphous soft tissue in the portacaval and hepatoduodenal ligament regions which has indeterminate but potentially malignant imaging characteristics (with diffusion restriction). This is of uncertain etiology and significance, but could represent a lymphoma, or potentially nodal metastatic disease. Correlation with PET-CT is recommended to assess for hypermetabolism. Should this lesion prove to be hypermetabolic on PET-CT, further evaluation with endoscopic ultrasound and biopsy would be suggested. 3. Multiple additional simple renal cysts. These results will be called to the ordering clinician or representative by the Radiologist Assistant, and communication documented in the PACS or zVision Dashboard.   Electronically Signed   By: Vinnie Langton M.D.   On: 03/06/2014 08:57    Ct Abdomen Pelvis W Contrast 03/04/2014  IMPRESSION: 3.8 x 4.1 x 5.8 cm diameter distal descending colon mass compatible with a primary colonic malignancy.  9 mm short axis lymph node in the adjacent colonic mesenteric.  BILATERAL renal cysts, largest of which at the RIGHT kidney shows questionable area of mural  nodularity; followup nonemergent characterization of this lesion by MR imaging with and without contrast recommended to exclude cystic renal neoplasm.  Two nonspecific low-attenuation foci within liver.   Electronically Signed   By: Lavonia Dana M.D.   On: 03/04/2014 11:12   PET 11/10/2014  IMPRESSION: 1. Status post sigmoidectomy with two small areas of hypermetabolism in the right lobe of the liver, which are concerning for potential metastatic lesions, as above. This could be confirmed with MRI of the abdomen with and without IV gadolinium. 2. There is some relatively diffuse hypermetabolism in the rectum, which is favored to be either physiologic or related to a proctitis. No focal mass like soft tissue is noted adjacent to the suture line to suggest local recurrence of disease. 3. Small focus of ground-glass attenuation in the medial aspect of the right lower lobe, favored to reflect developing fibrosis related to adjacent large marginal osteophytes in the thoracic spine (reference: AJR Am J Roentgenol. 2002 Oct;179(4):893-6.). There is some low-level hypermetabolism in this region at this time, however, this finding is strongly favored to be benign. Attention at time of follow-up imaging is recommended to ensure the stability or resolution of these findings. 4. Dilatation of the pulmonic trunk (3.9 cm in diameter), suggesting pulmonary arterial hypertension.  PATHOLOGY:  Diagnosis 1. Colon, segmental resection for tumor, sigmoid - INVASIVE ADENOCARCINOMA, SEE COMMENT. - TUMOR EXTENDS INTO MUSCULARIS PROPRIA. - NEGATIVE FOR LYMPH VASCULAR INVASION. Diagnosis(continued) - NEGATIVE FOR PERINEURAL INVASION.- VISCERAL PERITONEUM, NEGATIVE FOR TUMOR.- FOURTEEN LYMPH NODES, NEGATIVE FOR TUMOR (0/14).- SURGICAL RESECTION MARGINS, NEGATIVE FOR TUMOR.- ENDOSCOPIC TATTOOING IDENTIFIED.- SEE TUMOR SYNOPTIC TEMPLATE BELOW. 2. Colon, resection margin (donut) - BENIGN COLON. - NEGATIVE FOR  DYSPLASIA OR MALIGNANCY. Microscopic Comment 1. COLON AND RECTUM (INCLUDING TRANS-ANAL RESECTION): Specimen: Sigmoid colon. Procedure: Resection. Tumor site: Mesenteric-antimesenteric junction. Specimen integrity: Intact. Macroscopic intactness of mesorectum: Not applicable. Macroscopic tumor perforation: Absent. Invasive tumor: Maximum size: See comment. Histologic type(s): Adenocarcinoma. Histologic grade and differentiation: G1: well differentiated/low grade Type of polyp in which invasive carcinoma arose: Tubulovillous adenoma with high grade dysplasia. Microscopic extension of invasive tumor: Tumor focally extends into muscularis propria, see comment. Lymph-Vascular invasion: Absent. Peri-neural invasion: Absent. Tumor deposit(s) (discontinuous extramural extension): Absent. Resection margins: Proximal margin: Negative. Distal margin: Negative. Circumferential (radial) (posterior ascending, posterior descending; lateral and posterior mid-rectum; and entire lower 1/3 rectum): Negative.Mesenteric margin (sigmoid and transverse): Negative. Distance closest margin (if all above margins negative): Mesenteric margin (5 cm). Treatment effect (  neo-adjuvant therapy): None. Additional polyp(s): None. Non-neoplastic findings: None. Lymph nodes: number examined 14; number positive: 0 Pathologic Staging: pT2, pN0, pMX Ancillary studies: Per the [place] gastrointestinal working group guidelines, tissue will be submitted for microsatellite instability by PCR as well as mismatch repair protein expression by immunohistochemistry. Comments: The mass is entirely submitted for review. There are foci of invasive adenocarcinoma arising in a background of a polypoid tubular adenoma with high grade dysplasia. There is prominent prolapsing of the muscularis propria. However, areas demonstrate definitive involvement of muscularis propria by invasive tumor (pT2). (CRR:ecj 03/14/2014) [place] RUND DO Pathologist, Electronic  Signature (Case signed 03/14/2014)  Mismatch Repair (MMR) Protein Immunohistochemistry (IHC)  IHC Expression Result:  MLH1: Preserved nuclear expression (greater 50% tumor expression)  MSH2: Preserved nuclear expression (greater 50% tumor expression)  MSH6: Preserved nuclear expression (greater 50% tumor expression)  PMS2: Preserved nuclear expression (greater 50% tumor expression)  * Internal control demonstrates intact nuclear expression  Interpretation: NORMAL  ASSESSMENT: Veronica Reyes 59 y.o. female with a history of  1.Colon Cancer, Stage I (pT2N0) -- She is clinically doing well, physical exam was unremarkable.  -I discussed her severity and PET scan and a personally reviewed the scan with her. There are 2 small uptake in the liver, metastatic lesion are not ruled out. Her prior abdominal MRI also showed some soft tissue density in the periportal area.  -I recommend her to have a abdominal MRI for further evaluation. -She will have a colonoscopy in June 2016 with Dr Benson Norway.   2. History of PE (01/06/2014) --She has a diagnosis of pulmonary embolism, likely secondary to malignancy in the setting of family history for stroke (and possible stroke history in patient as well). Lower extremity dopplers negative.  -She has been on Xarelto for 11 months now, if her abdominal MRI scan reviewed his no evidence of cancer recurrence, I think she can come off anticoagulation. Continue for now  3. History of Breast Cancer --Left CIS in situ, s/p lumpectomy in 2006 and 2011 by Dr. Margot Chimes, and radiation in 2011 by Dr. Sondra Come. Last mammogram on 01/09/14 negative -Continue yearly mammogram and a physical exam.   4.  Uterine Fibroids  --Negative endometrial biopsy. Following with Gynecology.  6. Kidney lesion (Right). -No hypermetabolic right kidney lesion on the pet scan -Repeat abdominal MRI  Follow up. -2 weeks after abdomen MRI   All questions were answered. The patient knows to  call the clinic with any problems, questions or concerns. We can certainly see the patient much sooner if necessary.  I spent 25 minutes counseling the patient face to face. The total time spent in the appointment was 30 minutes.   [name]  11/17/14

## 2014-11-18 ENCOUNTER — Encounter: Payer: Self-pay | Admitting: Hematology

## 2014-11-30 ENCOUNTER — Ambulatory Visit (HOSPITAL_COMMUNITY)
Admission: RE | Admit: 2014-11-30 | Discharge: 2014-11-30 | Disposition: A | Discharge status: Home / Self Care | Payer: 59 | Source: Ambulatory Visit | Attending: Hematology | Admitting: Hematology

## 2014-11-30 DIAGNOSIS — Z85038 Personal history of other malignant neoplasm of large intestine: Secondary | ICD-10-CM | POA: Insufficient documentation

## 2014-11-30 DIAGNOSIS — C187 Malignant neoplasm of sigmoid colon: Secondary | ICD-10-CM

## 2014-11-30 MED ORDER — GADOBENATE DIMEGLUMINE 529 MG/ML IV SOLN: 20.0000 mL | Freq: Once | INTRAVENOUS | Status: AC | PRN

## 2014-11-30 MED ORDER — GADOBENATE DIMEGLUMINE 529 MG/ML IV SOLN
20.0000 mL | Freq: Once | INTRAVENOUS | Status: AC | PRN
  Administered 2014-11-30: 20 mL via INTRAVENOUS

## 2014-12-01 ENCOUNTER — Ambulatory Visit (HOSPITAL_BASED_OUTPATIENT_CLINIC_OR_DEPARTMENT_OTHER): Payer: 59 | Admitting: Hematology

## 2014-12-01 ENCOUNTER — Telehealth: Payer: Self-pay | Admitting: Hematology

## 2014-12-01 VITALS — BP 136/81 | HR 62 | Temp 98.4°F | Resp 18 | Ht 66.0 in | Wt 281.7 lb

## 2014-12-01 DIAGNOSIS — C187 Malignant neoplasm of sigmoid colon: Secondary | ICD-10-CM

## 2014-12-01 DIAGNOSIS — K769 Liver disease, unspecified: Secondary | ICD-10-CM

## 2014-12-01 DIAGNOSIS — N289 Disorder of kidney and ureter, unspecified: Secondary | ICD-10-CM

## 2014-12-01 DIAGNOSIS — Z86711 Personal history of pulmonary embolism: Secondary | ICD-10-CM

## 2014-12-01 DIAGNOSIS — Z853 Personal history of malignant neoplasm of breast: Secondary | ICD-10-CM

## 2014-12-01 NOTE — Progress Notes (Signed)
Veronica Reyes: 06/27/2014  Veronica Reyes., FNP [name] 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 Left breast DCIS diagnosed in 2006 and 2010, status post lumpectomy. 2. pT2N0M0 stage I colon cancer, s/p partial colectomy on 03/09/2014  3. PE in 12/2013 when she was diagnosed with colon cancer.  CURRENT TREATMENT:  Xalreto 20 mg daily.   INTERVAL HISTORY:  Veronica Reyes 59 y.o. female with a history of breast cancer, colon cancer s/p laparoscopic-assisted partial colectomy (03/09/2014), PE (01/06/2014) , is here for follow up and discuss her MRI results.  She feels about the same, still has intermittent right frank pain since the surgery last summer. It's mild, does not limit her activities and she takes tylenol 2 tab daily for this. No other new pain, nausea, or other complains. She has mild diarrhea after certain greasy food. She has good appetite and eats well, weight stable.    MEDICAL HISTORY: Past Medical History  Diagnosis Date  . Hypertension   . Cancer     left breast cancer x2  . Breast CA     radiation and surgery  . Shortness of breath     sob with exertion. dx. with Pulmonary emboli 4'15 ,tx. Xarelto,Lovenox used for [employer].  . Pulmonary embolism     "blood clot in lungs" -dx. 4'15 with CT Chest.    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 coli UTI (urinary tract infection); H/O colon cancer, stage II; and Renal lesion on her problem list.    ALLERGIES:  is allergic to tramadol.  MEDICATIONS:    Medication List       This list is accurate as of: 12/01/14 11:59 PM.  Always use your most recent med list.               amLODipine  2.5 MG tablet  Commonly known as:  NORVASC  Take 2.5 mg by mouth daily.     ferrous sulfate 325 (65 FE) MG tablet  Take 325 mg by mouth every morning.     hydrochlorothiazide 25 MG tablet  Commonly known as:  HYDRODIURIL  Take 25 mg by mouth every morning.     LORazepam 0.5 MG tablet  Commonly known as:  ATIVAN  Take 1 tablet (0.5 mg total) by mouth at bedtime.     metoprolol 50 MG tablet  Commonly known as:  LOPRESSOR  Take 50 mg by mouth 2 (two) times daily.     potassium chloride SA 20 MEQ tablet  Commonly known as:  K-DUR,KLOR-CON  Take 1 tablet (20 mEq total) by mouth 2 (two) times daily.     rivaroxaban 20 MG Tabs tablet  Commonly known as:  XARELTO  Take 1 tablet (20 mg total) by mouth daily with supper.     zolpidem 5 MG tablet  Commonly known as:  AMBIEN  Take 1 tablet (5 mg total) by mouth at bedtime as needed for sleep.        SURGICAL HISTORY:  Past Surgical History  Procedure Laterality Date  . Cesarean section    . Breast surgery      '11- Left breast lumpectomy  . Cholecystectomy      Laparoscopic 20 yrs ago  . Partial colectomy N/A 03/09/2014  Procedure: LAPAROSCOPIC ASSISTED PARTIAL COLECTOMY, splenic flexure mobilization;  Surgeon: Leighton Ruff, MD;  Location: [facility];  Service: General;  Laterality: N/A;    REVIEW OF SYSTEMS:   Constitutional: Denies fevers, chills or abnormal weight loss Eyes: Denies blurriness of vision Ears, nose, mouth, throat, and face: Denies mucositis or sore throat Respiratory: Denies cough, dyspnea or wheezes Cardiovascular: Denies palpitation, chest discomfort or lower extremity swelling Gastrointestinal:  Denies nausea, heartburn or change in bowel habits Skin: Denies abnormal skin rashes Lymphatics: Denies new lymphadenopathy or easy bruising Neurological:Denies numbness, tingling or new weaknesses Behavioral/Psych: Mood is stable, no new changes  All other systems were reviewed with the patient and are  negative.  PHYSICAL EXAMINATION: ECOG PERFORMANCE STATUS: 0  Blood pressure 136/81, pulse 62, temperature 98.4 F (36.9 C), temperature source Oral, resp. rate 18, height _0  (1.676 m), weight 281 lb 11.2 oz (127.778 kg), last menstrual period 01/23/2014.  GENERAL:alert, no distress and comfortable; well developed and obese. Easily mobile to exam table.  SKIN: skin color, texture, turgor are normal, no rashes or significant lesions EYES: normal, Conjunctiva are pink and non-injected, sclera clear OROPHARYNX:no exudate, no erythema and lips, buccal mucosa, and tongue normal  NECK: supple, thyroid normal size, non-tender, without nodularity LYMPH:  no palpable lymphadenopathy in the cervical, axillary or supraclavicular LUNGS: clear to auscultation with normal breathing effort, no wheezes or rhonchi HEART: regular rate & rhythm and no murmurs and no lower extremity edema ABDOMEN:abdomen soft, non-tender and normal bowel sounds; incision healing with signs of infection. Rectal exam negative for palpable lesion, no blood on glove  Musculoskeletal:no cyanosis of digits and no clubbing  NEURO: alert & oriented x 3 with fluent speech, no focal motor/sensory deficits  Labs:  Lab Results  Component Value Date   WBC 9.4 11/17/2014   HGB 14.5 11/17/2014   HCT 43.2 11/17/2014   MCV 93.5 11/17/2014   PLT 254 11/17/2014   NEUTROABS 5.0 11/17/2014      Chemistry      Component Value Date/Time   NA 143 11/17/2014 1348   NA 139 03/12/2014 0620   K 4.0 11/17/2014 1348   K 3.4* 03/12/2014 0620   CL 102 03/12/2014 0620   CO2 24 11/17/2014 1348   CO2 24 03/12/2014 0620   BUN 11.1 11/17/2014 1348   BUN 4* 03/12/2014 0620   CREATININE 0.8 11/17/2014 1348   CREATININE 0.65 03/12/2014 0620      Component Value Date/Time   CALCIUM 9.8 11/17/2014 1348   CALCIUM 8.6 03/12/2014 0620   ALKPHOS 45 11/17/2014 1348   ALKPHOS 38* 03/03/2014 1831   AST 30 11/17/2014 1348   AST 37 03/03/2014 1831    ALT 31 11/17/2014 1348   ALT 30 03/03/2014 1831   BILITOT 0.47 11/17/2014 1348   BILITOT 0.2* 03/03/2014 1831       RADIOGRAPHIC STUDIES: MRI I abdomen with and without contrast 11/30/2014 IMPRESSION: 1. Within segment 7 of the liver there is a new enhancing lesion which corresponds to an area of abnormal hyper metabolism on recent PET-CT. This is concerning for metastatic disease. 2. Stable enlarged portal caval lymph node that has a short axis dimension measuring 2.4 cm. 3. Slight increase in size of Bosniak category 2 F lesion arising from the interpolar right kidney. Followup examination in 12 months is advised.  PET 11/10/2014  IMPRESSION: 1. Status post sigmoidectomy with two small areas of hypermetabolism in the right lobe of the liver, which are concerning for potential metastatic  lesions, as above. This could be confirmed with MRI of the abdomen with and without IV gadolinium. 2. There is some relatively diffuse hypermetabolism in the rectum, which is favored to be either physiologic or related to a proctitis. No focal mass like soft tissue is noted adjacent to the suture line to suggest local recurrence of disease. 3. Small focus of ground-glass attenuation in the medial aspect of the right lower lobe, favored to reflect developing fibrosis related to adjacent large marginal osteophytes in the thoracic spine (reference: AJR Am J Roentgenol. 2002 Oct;179(4):893-6.). There is some low-level hypermetabolism in this region at this time, however, this finding is strongly favored to be benign. Attention at time of follow-up imaging is recommended to ensure the stability or resolution of these findings. 4. Dilatation of the pulmonic trunk (3.9 cm in diameter), suggesting pulmonary arterial hypertension.  PATHOLOGY:  Diagnosis 1. Colon, segmental resection for tumor, sigmoid - INVASIVE ADENOCARCINOMA, SEE COMMENT. - TUMOR EXTENDS INTO MUSCULARIS PROPRIA. - NEGATIVE  FOR LYMPH VASCULAR INVASION. Diagnosis(continued) - NEGATIVE FOR PERINEURAL INVASION.- VISCERAL PERITONEUM, NEGATIVE FOR TUMOR.- FOURTEEN LYMPH NODES, NEGATIVE FOR TUMOR (0/14).- SURGICAL RESECTION MARGINS, NEGATIVE FOR TUMOR.- ENDOSCOPIC TATTOOING IDENTIFIED.- SEE TUMOR SYNOPTIC TEMPLATE BELOW. 2. Colon, resection margin (donut) - BENIGN COLON. - NEGATIVE FOR DYSPLASIA OR MALIGNANCY. Microscopic Comment 1. COLON AND RECTUM (INCLUDING TRANS-ANAL RESECTION): Specimen: Sigmoid colon. Procedure: Resection. Tumor site: Mesenteric-antimesenteric junction. Specimen integrity: Intact. Macroscopic intactness of mesorectum: Not applicable. Macroscopic tumor perforation: Absent. Invasive tumor: Maximum size: See comment. Histologic type(s): Adenocarcinoma. Histologic grade and differentiation: G1: well differentiated/low grade Type of polyp in which invasive carcinoma arose: Tubulovillous adenoma with high grade dysplasia. Microscopic extension of invasive tumor: Tumor focally extends into muscularis propria, see comment. Lymph-Vascular invasion: Absent. Peri-neural invasion: Absent. Tumor deposit(s) (discontinuous extramural extension): Absent. Resection margins: Proximal margin: Negative. Distal margin: Negative. Circumferential (radial) (posterior ascending, posterior descending; lateral and posterior mid-rectum; and entire lower 1/3 rectum): Negative.Mesenteric margin (sigmoid and transverse): Negative. Distance closest margin (if all above margins negative): Mesenteric margin (5 cm). Treatment effect (neo-adjuvant therapy): None. Additional polyp(s): None. Non-neoplastic findings: None. Lymph nodes: number examined 14; number positive: 0 Pathologic Staging: pT2, pN0, pMX Ancillary studies: Per the [place] gastrointestinal working group guidelines, tissue will be submitted for microsatellite instability by PCR as well as mismatch repair protein expression by immunohistochemistry. Comments: The mass is  entirely submitted for review. There are foci of invasive adenocarcinoma arising in a background of a polypoid tubular adenoma with high grade dysplasia. There is prominent prolapsing of the muscularis propria. However, areas demonstrate definitive involvement of muscularis propria by invasive tumor (pT2). (CRR:ecj 03/14/2014) Mali RUND DO Pathologist, Electronic Signature (Case signed 03/14/2014)  Mismatch Repair (MMR) Protein Immunohistochemistry (IHC)  IHC Expression Result:  MLH1: Preserved nuclear expression (greater 50% tumor expression)  MSH2: Preserved nuclear expression (greater 50% tumor expression)  MSH6: Preserved nuclear expression (greater 50% tumor expression)  PMS2: Preserved nuclear expression (greater 50% tumor expression)  * Internal control demonstrates intact nuclear expression  Interpretation: NORMAL  ASSESSMENT: Veronica Reyes 59 y.o. female with a history of  1.Colon Cancer, Stage I (pT2N0), MMR normal, possible recurrent disease in liver --I discussed her se recent abdominal MRI and PET scan  and personally reviewed the scan with her. There are 2 small uptake in the liver, corresponding to a liver lesion with enhancement in segment 7 of MRI, this is highly suspicious for metastatic disease.  -I recommend her to have a CT-guided liver lesion biopsy to ruled  out metastasis. She agrees    2. History of PE (01/06/2014) --She has a diagnosis of pulmonary embolism, likely secondary to malignancy in the setting of family history for stroke (and possible stroke history in patient as well). Lower extremity dopplers negative.  -She has been on Xarelto for 11 months now, if her abdominal MRI scan reviewed his no evidence of cancer recurrence, I think she can come off anticoagulation. Continue for now  3. History of Breast Cancer --Left CIS in situ, s/p lumpectomy in 2006 and 2011 by Dr. Margot Chimes, and radiation in 2011 by Dr. Sondra Come. Last mammogram on 01/09/14  negative -Continue yearly mammogram and a physical exam.   4.  Uterine Fibroids  --Negative endometrial biopsy. Following with Gynecology.  6. Kidney lesion (Right). -No hypermetabolic right kidney lesion on the pet scan -Repeat abdominal MRI  Follow up. -IR liver biopsy  -RTC in 3 weeks   All questions were answered. The patient knows to call the clinic with any problems, questions or concerns. We can certainly see the patient much sooner if necessary.  I spent 25 minutes counseling the patient face to face. The total time spent in the appointment was 30 minutes.   [name]  12/01/14

## 2014-12-01 NOTE — Telephone Encounter (Signed)
Gave avs & calendar for April. °

## 2014-12-02 ENCOUNTER — Encounter: Payer: Self-pay | Admitting: Hematology

## 2014-12-21 ENCOUNTER — Other Ambulatory Visit: Payer: Self-pay | Admitting: Hematology

## 2014-12-21 ENCOUNTER — Telehealth: Payer: Self-pay | Admitting: *Deleted

## 2014-12-21 DIAGNOSIS — C50912 Malignant neoplasm of unspecified site of left female breast: Secondary | ICD-10-CM

## 2014-12-21 DIAGNOSIS — C187 Malignant neoplasm of sigmoid colon: Secondary | ICD-10-CM

## 2014-12-21 NOTE — Telephone Encounter (Signed)
Opened in error

## 2014-12-22 ENCOUNTER — Ambulatory Visit: Payer: 59 | Admitting: Hematology

## 2014-12-22 ENCOUNTER — Telehealth: Payer: Self-pay | Admitting: Hematology

## 2014-12-22 NOTE — Telephone Encounter (Signed)
Patient called and reschedule appointment for 04/08 to 04/21. Patient confirmed changed appointment.

## 2014-12-28 ENCOUNTER — Other Ambulatory Visit: Payer: Self-pay | Admitting: Nurse Practitioner

## 2014-12-28 DIAGNOSIS — Z9889 Other specified postprocedural states: Secondary | ICD-10-CM

## 2014-12-28 DIAGNOSIS — Z853 Personal history of malignant neoplasm of breast: Secondary | ICD-10-CM

## 2014-12-31 ENCOUNTER — Other Ambulatory Visit: Payer: Self-pay | Admitting: Radiology

## 2015-01-01 ENCOUNTER — Other Ambulatory Visit: Payer: Self-pay | Admitting: Radiology

## 2015-01-02 ENCOUNTER — Ambulatory Visit (HOSPITAL_COMMUNITY)
Admission: RE | Admit: 2015-01-02 | Discharge: 2015-01-02 | Disposition: A | Discharge status: Home / Self Care | Payer: 59 | Source: Ambulatory Visit | Attending: Hematology | Admitting: Hematology

## 2015-01-02 ENCOUNTER — Encounter (HOSPITAL_COMMUNITY): Payer: Self-pay

## 2015-01-02 DIAGNOSIS — C50912 Malignant neoplasm of unspecified site of left female breast: Secondary | ICD-10-CM

## 2015-01-02 DIAGNOSIS — Z85038 Personal history of other malignant neoplasm of large intestine: Secondary | ICD-10-CM | POA: Diagnosis not present

## 2015-01-02 DIAGNOSIS — Z86711 Personal history of pulmonary embolism: Secondary | ICD-10-CM | POA: Diagnosis not present

## 2015-01-02 DIAGNOSIS — C787 Secondary malignant neoplasm of liver and intrahepatic bile duct: Secondary | ICD-10-CM | POA: Diagnosis not present

## 2015-01-02 DIAGNOSIS — I10 Essential (primary) hypertension: Secondary | ICD-10-CM | POA: Diagnosis not present

## 2015-01-02 DIAGNOSIS — Z7902 Long term (current) use of antithrombotics/antiplatelets: Secondary | ICD-10-CM | POA: Insufficient documentation

## 2015-01-02 DIAGNOSIS — K769 Liver disease, unspecified: Secondary | ICD-10-CM | POA: Diagnosis present

## 2015-01-02 DIAGNOSIS — Z853 Personal history of malignant neoplasm of breast: Secondary | ICD-10-CM | POA: Insufficient documentation

## 2015-01-02 DIAGNOSIS — C187 Malignant neoplasm of sigmoid colon: Secondary | ICD-10-CM

## 2015-01-02 HISTORY — DX: Liver disease, unspecified: K76.9

## 2015-01-02 LAB — APTT: aPTT: 29 seconds (ref 24–37)

## 2015-01-02 LAB — CBC
HEMATOCRIT: 40.2 % (ref 36.0–46.0)
Hemoglobin: 13 g/dL (ref 12.0–15.0)
MCH: 30.7 pg (ref 26.0–34.0)
MCHC: 32.3 g/dL (ref 30.0–36.0)
MCV: 94.8 fL (ref 78.0–100.0)
Platelets: 222 10*3/uL (ref 150–400)
RBC: 4.24 MIL/uL (ref 3.87–5.11)
RDW: 14.3 % (ref 11.5–15.5)
WBC: 7.6 10*3/uL (ref 4.0–10.5)

## 2015-01-02 LAB — PROTIME-INR
INR: 1.11 (ref 0.00–1.49)
Prothrombin Time: 14.5 seconds (ref 11.6–15.2)

## 2015-01-02 MED ORDER — SODIUM CHLORIDE 0.9 % IV SOLN
INTRAVENOUS | Status: DC
  Administered 2015-01-02: 09:00:00 via INTRAVENOUS

## 2015-01-02 MED ORDER — GELATIN ABSORBABLE 12-7 MM EX MISC
CUTANEOUS | Status: AC
  Filled 2015-01-02: qty 1

## 2015-01-02 MED ORDER — FENTANYL CITRATE (PF) 100 MCG/2ML IJ SOLN
INTRAMUSCULAR | Status: AC | PRN
  Administered 2015-01-02 (×2): 50 ug via INTRAVENOUS

## 2015-01-02 MED ORDER — FENTANYL CITRATE (PF) 100 MCG/2ML IJ SOLN
INTRAMUSCULAR | Status: AC
  Filled 2015-01-02: qty 4

## 2015-01-02 MED ORDER — LIDOCAINE-EPINEPHRINE 1 %-1:100000 IJ SOLN
INTRAMUSCULAR | Status: AC
  Filled 2015-01-02: qty 1

## 2015-01-02 MED ORDER — MIDAZOLAM HCL 2 MG/2ML IJ SOLN
INTRAMUSCULAR | Status: AC | PRN
  Administered 2015-01-02 (×2): 1 mg via INTRAVENOUS

## 2015-01-02 MED ORDER — MIDAZOLAM HCL 2 MG/2ML IJ SOLN
INTRAMUSCULAR | Status: AC
  Filled 2015-01-02: qty 4

## 2015-01-02 NOTE — Sedation Documentation (Signed)
Patient is resting comfortably. 

## 2015-01-02 NOTE — Procedures (Signed)
Technically successful CT and US guided biopsy of indeterminate lesion in the posterior aspect of the right lobe of the liver.  No immediate post procedural complications.

## 2015-01-02 NOTE — Discharge Instructions (Signed)
Liver Biopsy, Care After °Refer to this sheet in the next few weeks. These instructions provide you with information on caring for yourself after your procedure. Your health care provider may also give you more specific instructions. Your treatment has been planned according to current medical practices, but problems sometimes occur. Call your health care provider if you have any problems or questions after your procedure. °WHAT TO EXPECT AFTER THE PROCEDURE °After your procedure, it is typical to have the following: °· A small amount of discomfort in the area where the biopsy was done and in the right shoulder or shoulder blade. °· A small amount of bruising around the area where the biopsy was done and on the skin over the liver. °· Sleepiness and fatigue for the rest of the day. °HOME CARE INSTRUCTIONS  °· Rest at home for 1-2 days or as directed by your health care provider. °· Have a friend or family member stay with you for at least 24 hours. °· Because of the medicines used during the procedure, you should not do the following things in the first 24 hours: °¨ Drive. °¨ Use machinery. °¨ Be responsible for the care of other people. °¨ Sign legal documents. °¨ Take a bath or shower. °· There are many different ways to close and cover an incision, including stitches, skin glue, and adhesive strips. Follow your health care provider's instructions on: °¨ Incision care. °¨ Bandage (dressing) changes and removal. °¨ Incision closure removal. °· Do not drink alcohol in the first week. °· Do not lift more than 5 pounds or play contact sports for 2 weeks after this test. °· Take medicines only as directed by your health care provider. Do not take medicine containing aspirin or non-steroidal anti-inflammatory medicines such as ibuprofen for 1 week after this test. °· It is your responsibility to get your test results. °SEEK MEDICAL CARE IF:  °· You have increased bleeding from an incision that results in more than a  small spot of blood. °· You have redness, swelling, or increasing pain in any incisions. °· You notice a discharge or a bad smell coming from any of your incisions. °· You have a fever or chills. °SEEK IMMEDIATE MEDICAL CARE IF:  °· You develop swelling, bloating, or pain in your abdomen. °· You become dizzy or faint. °· You develop a rash. °· You are nauseous or vomit. °· You have difficulty breathing, feel short of breath, or feel faint. °· You develop chest pain. °· You have problems with your speech or vision. °· You have trouble balancing or moving your arms or legs. °Document Released: 03/21/2005 Document Revised: 01/16/2014 Document Reviewed: 10/28/2013 °[employer]® Patient Information ©2015 [employer]. This information is not intended to replace advice given to you by your health care provider. Make sure you discuss any questions you have with your health care provider. ° °

## 2015-01-02 NOTE — Sedation Documentation (Signed)
Pt awake following commands to hold breath, having moderate pain w/ manipulation of needle.

## 2015-01-02 NOTE — Sedation Documentation (Signed)
Transferred pt to stretcher, no c/o pain.

## 2015-01-02 NOTE — Sedation Documentation (Signed)
Dr. Pascal Lux in to s/w pt regarding procedure risks and benefits and answer questions for patient. Brother to pick up pt from short stay per patient.

## 2015-01-02 NOTE — Sedation Documentation (Signed)
Pt still holding breath when instructed w/out difficulty. Obtaining samples.

## 2015-01-02 NOTE — Sedation Documentation (Signed)
Dr. Pascal Lux obtaining samples. Pt comfortable w/ no c/o pain.

## 2015-01-02 NOTE — Progress Notes (Signed)
Chief Complaint: "I'm here for a liver biopsy Referring Physician:Fang HPI: Veronica Reyes is an 59 y.o. female with prior hx of breast cancer and recent hx of colon cancer. PET/CT scan finds a small liver lesion concerning for metastasis. An MRI was also done and she is now scheduled for biopsy. She is normally on Xarelto for hx PE last year. She last took her Xarelto on 4/16. Has been NPO this am.  Past Medical History:  Past Medical History  Diagnosis Date  . Hypertension   . Cancer     left breast cancer x2  . Breast CA     radiation and surgery  . Shortness of breath     sob with exertion. dx. with Pulmonary emboli 4'15 ,tx. Xarelto,Lovenox used for [employer].  . Pulmonary embolism     "blood clot in lungs" -dx. 4'15 with CT Chest.  . Colon cancer   . Liver lesion     Past Surgical History:  Past Surgical History  Procedure Laterality Date  . Cesarean section    . Breast surgery      '11- Left breast lumpectomy  . Cholecystectomy      Laparoscopic 20 yrs ago  . Partial colectomy N/A 03/09/2014    Procedure: LAPAROSCOPIC ASSISTED PARTIAL COLECTOMY, splenic flexure mobilization;  Surgeon: Leighton Ruff, MD;  Location: [facility];  Service: General;  Laterality: N/A;    Family History:  Family History  Problem Relation Age of Onset  . Hypertension Sister   . Cancer Cousin     Social History:  reports that she has never smoked. She has never used smokeless tobacco. She reports that she does not drink alcohol or use illicit drugs.  Allergies:  Allergies  Allergen Reactions  . Tramadol Other (See Comments)    Very drowsy, "  Out of  It  "    Medications:   Medication List    ASK your doctor about these medications        amLODipine 2.5 MG tablet  Commonly known as:  NORVASC  Take 2.5 mg by mouth daily.     ferrous sulfate 325 (65 FE) MG tablet  Take 325 mg by mouth every morning.     hydrochlorothiazide 25 MG tablet  Commonly known as:  HYDRODIURIL   Take 25 mg by mouth every morning.     LORazepam 0.5 MG tablet  Commonly known as:  ATIVAN  Take 1 tablet (0.5 mg total) by mouth at bedtime.     metoprolol 50 MG tablet  Commonly known as:  LOPRESSOR  Take 50 mg by mouth 2 (two) times daily.     potassium chloride SA 20 MEQ tablet  Commonly known as:  K-DUR,KLOR-CON  Take 1 tablet (20 mEq total) by mouth 2 (two) times daily.     rivaroxaban 20 MG Tabs tablet  Commonly known as:  XARELTO  Take 1 tablet (20 mg total) by mouth daily with supper.     zolpidem 5 MG tablet  Commonly known as:  AMBIEN  Take 1 tablet (5 mg total) by mouth at bedtime as needed for sleep.        Please HPI for pertinent positives, otherwise complete 10 system ROS negative.  Physical Exam: BP 145/78 mmHg  Pulse 59  Temp(Src) 98.3 F (36.8 C) (Oral)  Resp 18  Ht 5\' 6"  (1.676 m)  Wt 269 lb (122.018 kg)  BMI 43.44 kg/m2  SpO2 96%  LMP 01/23/2014 Body mass index is 43.44 kg/(m^2).  General Appearance:  Alert, cooperative, no distress, obese  Head:  Normocephalic, without obvious abnormality, atraumatic  ENT: Unremarkable  Neck: Supple, symmetrical, trachea midline  Lungs:   Clear to auscultation bilaterally, no w/r/r, respirations unlabored without use of accessory muscles.  Chest Wall:  No tenderness or deformity  Heart:  Regular rate and rhythm, S1, S2 normal, no murmur, rub or gallop.  Abdomen:   Soft, non-tender, non distended.  Neurologic: Normal affect, no gross deficits.  Labs: No results found for this or any previous visit (from the past 48 hour(s)).  Imaging: No results found.  Assessment/Plan Colon cancer, hx of breast cancer (R)hepatic lesion Lesion is small and difficult to visualize. Will use both CT and [place] modalities for procedure. Explained procedure, risks and complications including infection, hemorrhage, small chance of PTX given location of lesion near pleura. Explained use and risks of sedation.  Labs  pending Consent signed in chart  Ascencion Dike PA-C 01/02/2015, 8:43 AM

## 2015-01-04 ENCOUNTER — Ambulatory Visit (HOSPITAL_BASED_OUTPATIENT_CLINIC_OR_DEPARTMENT_OTHER): Payer: 59 | Admitting: Hematology

## 2015-01-04 ENCOUNTER — Encounter: Payer: Self-pay | Admitting: Hematology

## 2015-01-04 VITALS — BP 145/76 | HR 61 | Temp 98.4°F | Resp 18 | Ht 66.0 in | Wt 282.2 lb

## 2015-01-04 DIAGNOSIS — C187 Malignant neoplasm of sigmoid colon: Secondary | ICD-10-CM | POA: Diagnosis not present

## 2015-01-04 DIAGNOSIS — Z853 Personal history of malignant neoplasm of breast: Secondary | ICD-10-CM

## 2015-01-04 DIAGNOSIS — Z86711 Personal history of pulmonary embolism: Secondary | ICD-10-CM | POA: Diagnosis not present

## 2015-01-04 DIAGNOSIS — C787 Secondary malignant neoplasm of liver and intrahepatic bile duct: Secondary | ICD-10-CM

## 2015-01-04 DIAGNOSIS — N289 Disorder of kidney and ureter, unspecified: Secondary | ICD-10-CM | POA: Diagnosis not present

## 2015-01-04 MED ORDER — ONDANSETRON HCL 8 MG PO TABS: 8.0000 mg | ORAL_TABLET | Freq: Two times a day (BID) | ORAL | Status: DC

## 2015-01-04 MED ORDER — CAPECITABINE 500 MG PO TABS: 1000.0000 mg/m2 | ORAL_TABLET | Freq: Two times a day (BID) | ORAL | Status: DC

## 2015-01-04 NOTE — Progress Notes (Signed)
[facility] PROGRESS NOTE DATE OF VISIT: 06/27/2014  [name]., FNP [name] 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 Left breast DCIS diagnosed in 2006 and 2010, status post lumpectomy. 2. pT2N0M0 stage I colon cancer, s/p partial colectomy on 03/09/2014  3. PE in 12/2013 when she was diagnosed with colon cancer.  Oncology History   Cancer of sigmoid colon   Staging form: Colon and Rectum, AJCC 7th Edition     Clinical: Stage I (T2, N0, M0) - Unsigned       Cancer of sigmoid colon   03/04/2014 Initial Diagnosis Cancer of sigmoid colon   03/09/2014 Pathology Results G1 invasive adenocarcinoma, no lymph-Vascular invasion or Peri-neural invasion: Absent. MMR normal, MSI stable.    03/09/2014 Surgery partial colectomy, negative margins.    11/10/2014 Relapse/Recurrence biopsy confirmed oligo liver recurrence    11/10/2014 Imaging PET showed two small ares of hypermetabolism in the right lobe of the liver, no other distant metastasis.    11/30/2014 Imaging abdomen MRI showed a new enhancing lesion which corresponds to an area of abnormal hyper metabolism on recent PET-CT. Stable enlarged portal caval lymph node.    01/02/2015 Pathology Results Liver biopsy showed metastatic adenocarcinoma, IHC (+) for CK20, CDX-2, (-) for TTF-1 and ER     CURRENT TREATMENT:  Xalreto 20 mg daily.   INTERVAL HISTORY:  Mahima Hottle 59 y.o. female with a history of breast cancer, colon cancer s/p laparoscopic-assisted partial colectomy (03/09/2014), PE (01/06/2014) , is here for follow up and discuss her liver biopsy result.   She tolerated the liver biopsy well 2 days ago. She stopped Evalina Field before the biopsy and has not restarted yet. She denies any pain or other new symptoms.    MEDICAL HISTORY: Past Medical History  Diagnosis Date  . Hypertension   . Cancer     left  breast cancer x2  . Breast CA     radiation and surgery  . Shortness of breath     sob with exertion. dx. with Pulmonary emboli 4'15 ,tx. Xarelto,Lovenox used for [employer].  . Pulmonary embolism     "blood clot in lungs" -dx. 4'15 with CT Chest.  . Colon cancer   . Liver lesion     ALLERGIES:  is allergic to tramadol.  MEDICATIONS:    Medication List       This list is accurate as of: 01/04/15  9:52 PM.  Always use your most recent med list.               amLODipine 2.5 MG tablet  Commonly known as:  NORVASC  Take 2.5 mg by mouth daily.     ferrous sulfate 325 (65 FE) MG tablet  Take 325 mg by mouth every morning.     hydrochlorothiazide 25 MG tablet  Commonly known as:  HYDRODIURIL  Take 25 mg by mouth every morning.     metoprolol 50 MG tablet  Commonly known as:  LOPRESSOR  Take 50 mg by mouth 2 (two) times daily.     potassium chloride SA 20 MEQ tablet  Commonly known as:  K-DUR,KLOR-CON  Take 1 tablet (20 mEq total) by mouth 2 (two) times daily.     rivaroxaban 20 MG Tabs tablet  Commonly known as:  XARELTO  Take 1 tablet (20 mg total) by mouth daily with supper.        SURGICAL HISTORY:  Past Surgical History  Procedure Laterality Date  . Cesarean section    . Breast surgery      '11- Left breast lumpectomy  . Cholecystectomy      Laparoscopic 20 yrs ago  . Partial colectomy N/A 03/09/2014    Procedure: LAPAROSCOPIC ASSISTED PARTIAL COLECTOMY, splenic flexure mobilization;  Surgeon: Leighton Ruff, MD;  Location: [facility];  Service: General;  Laterality: N/A;    REVIEW OF SYSTEMS:   Constitutional: Denies fevers, chills or abnormal weight loss Eyes: Denies blurriness of vision Ears, nose, mouth, throat, and face: Denies mucositis or sore throat Respiratory: Denies cough, dyspnea or wheezes Cardiovascular: Denies palpitation, chest discomfort or lower extremity swelling Gastrointestinal:  Denies nausea, heartburn or change in bowel habits Skin:  Denies abnormal skin rashes Lymphatics: Denies new lymphadenopathy or easy bruising Neurological:Denies numbness, tingling or new weaknesses Behavioral/Psych: Mood is stable, no new changes  All other systems were reviewed with the patient and are negative.  PHYSICAL EXAMINATION: ECOG PERFORMANCE STATUS: 0  Blood pressure 145/76, pulse 61, temperature 98.4 F (36.9 C), temperature source Oral, resp. rate 18, height '5\' 6"'  (1.676 m), weight 282 lb 3.2 oz (128.005 kg), last menstrual period 01/23/2014, SpO2 100 %.  GENERAL:alert, no distress and comfortable; well developed and obese. Easily mobile to exam table.  SKIN: skin color, texture, turgor are normal, no rashes or significant lesions EYES: normal, Conjunctiva are pink and non-injected, sclera clear OROPHARYNX:no exudate, no erythema and lips, buccal mucosa, and tongue normal  NECK: supple, thyroid normal size, non-tender, without nodularity LYMPH:  no palpable lymphadenopathy in the cervical, axillary or supraclavicular LUNGS: clear to auscultation with normal breathing effort, no wheezes or rhonchi HEART: regular rate & rhythm and no murmurs and no lower extremity edema ABDOMEN:abdomen soft, non-tender and normal bowel sounds; incision healing with signs of infection. Rectal exam negative for palpable lesion, no blood on glove  Musculoskeletal:no cyanosis of digits and no clubbing  NEURO: alert & oriented x 3 with fluent speech, no focal motor/sensory deficits  Labs:  CBC Latest Ref Rng 01/02/2015 11/17/2014 10/20/2014  WBC 4.0 - 10.5 K/uL 7.6 9.4 7.3  Hemoglobin 12.0 - 15.0 g/dL 13.0 14.5 14.5  Hematocrit 36.0 - 46.0 % 40.2 43.2 43.1  Platelets 150 - 400 K/uL 222 254 252    CMP Latest Ref Rng 11/17/2014 10/20/2014 06/27/2014  Glucose 70 - 140 mg/dl 93 104 89  BUN 7.0 - 26.0 mg/dL 11.1 12.5 8.0  Creatinine 0.6 - 1.1 mg/dL 0.8 0.9 0.9  Sodium 136 - 145 mEq/L 143 142 142  Potassium 3.5 - 5.1 mEq/L 4.0 3.3(L) 3.6  Chloride 96 - 112  mEq/L - - -  CO2 22 - 29 mEq/L '24 29 28  ' Calcium 8.4 - 10.4 mg/dL 9.8 9.4 9.8  Total Protein 6.4 - 8.3 g/dL 7.9 7.9 7.8  Total Bilirubin 0.20 - 1.20 mg/dL 0.47 0.59 0.54  Alkaline Phos 40 - 150 U/L 45 49 41  AST 5 - 34 U/L 30 52(H) 36(H)  ALT 0 - 55 U/L 31 44 40   CEA (Order [id number])      CEA  Status: Finalresult Visible to patient:  MyChart Nextappt: 01/11/2015 at 02:50 PM in Radiology ([facility]OM[facility] 2) Dx:  Cancer of sigmoid colon s/p colectomy...           Ref Range 1moago  121mogo     CEA 0.0 - 5.0 ng/mL 1.2 0.8   Resulting Agency  [facility]  PATHOLOGY RESULT Diagnosis 01/02/2015 Liver, needle/core biopsy, right - POSITIVE FOR METASTATIC ADENOCARCINOMA. - SEE COMMENT. Microscopic Comment Immunohistochemical stains are performed. The tumor is positive for cytokeratin 20 and CDX-2. Cytokeratin 7 stains background bile ducts but is negative within the tumor. The tumor is negative for TTF-1 and estrogen receptor. The morphology coupled with the staining pattern is consistent with metastatic adenocarcinoma of colorectal primary source.  RADIOGRAPHIC STUDIES: MRI I abdomen with and without contrast 11/30/2014 IMPRESSION: 1. Within segment 7 of the liver there is a new enhancing lesion which corresponds to an area of abnormal hyper metabolism on recent PET-CT. This is concerning for metastatic disease. 2. Stable enlarged portal caval lymph node that has a short axis dimension measuring 2.4 cm. 3. Slight increase in size of Bosniak category 2 F lesion arising from the interpolar right kidney. Followup examination in 12 months is advised.  PET 11/10/2014  IMPRESSION: 1. Status post sigmoidectomy with two small areas of hypermetabolism in the right lobe of the liver, which are concerning for potential metastatic lesions, as above. This could be confirmed with MRI of the abdomen with and without IV gadolinium. 2. There is  some relatively diffuse hypermetabolism in the rectum, which is favored to be either physiologic or related to a proctitis. No focal mass like soft tissue is noted adjacent to the suture line to suggest local recurrence of disease. 3. Small focus of ground-glass attenuation in the medial aspect of the right lower lobe, favored to reflect developing fibrosis related to adjacent large marginal osteophytes in the thoracic spine (reference: AJR Am J Roentgenol. 2002 Oct;179(4):893-6.). There is some low-level hypermetabolism in this region at this time, however, this finding is strongly favored to be benign. Attention at time of follow-up imaging is recommended to ensure the stability or resolution of these findings. 4. Dilatation of the pulmonic trunk (3.9 cm in diameter), suggesting pulmonary arterial hypertension.  PATHOLOGY:  Diagnosis 1. Colon, segmental resection for tumor, sigmoid 03/09/2014 - INVASIVE ADENOCARCINOMA, SEE COMMENT. - TUMOR EXTENDS INTO MUSCULARIS PROPRIA. - NEGATIVE FOR LYMPH VASCULAR INVASION. Diagnosis(continued) - NEGATIVE FOR PERINEURAL INVASION.- VISCERAL PERITONEUM, NEGATIVE FOR TUMOR.- FOURTEEN LYMPH NODES, NEGATIVE FOR TUMOR (0/14).- SURGICAL RESECTION MARGINS, NEGATIVE FOR TUMOR.- ENDOSCOPIC TATTOOING IDENTIFIED.- SEE TUMOR SYNOPTIC TEMPLATE BELOW. 2. Colon, resection margin (donut) - BENIGN COLON. - NEGATIVE FOR DYSPLASIA OR MALIGNANCY. Microscopic Comment 1. COLON AND RECTUM (INCLUDING TRANS-ANAL RESECTION): Specimen: Sigmoid colon. Procedure: Resection. Tumor site: Mesenteric-antimesenteric junction. Specimen integrity: Intact. Macroscopic intactness of mesorectum: Not applicable. Macroscopic tumor perforation: Absent. Invasive tumor: Maximum size: See comment. Histologic type(s): Adenocarcinoma. Histologic grade and differentiation: G1: well differentiated/low grade Type of polyp in which invasive carcinoma arose: Tubulovillous adenoma with high grade  dysplasia. Microscopic extension of invasive tumor: Tumor focally extends into muscularis propria, see comment. Lymph-Vascular invasion: Absent. Peri-neural invasion: Absent. Tumor deposit(s) (discontinuous extramural extension): Absent. Resection margins: Proximal margin: Negative. Distal margin: Negative. Circumferential (radial) (posterior ascending, posterior descending; lateral and posterior mid-rectum; and entire lower 1/3 rectum): Negative.Mesenteric margin (sigmoid and transverse): Negative. Distance closest margin (if all above margins negative): Mesenteric margin (5 cm). Treatment effect (neo-adjuvant therapy): None. Additional polyp(s): None. Non-neoplastic findings: None. Lymph nodes: number examined 14; number positive: 0 Pathologic Staging: pT2, pN0, pMX Ancillary studies: Per the [place] gastrointestinal working group guidelines, tissue will be submitted for microsatellite instability by PCR as well as mismatch repair protein expression by immunohistochemistry. Comments: The mass is entirely submitted for review. There are foci of invasive adenocarcinoma arising in a background  of a polypoid tubular adenoma with high grade dysplasia. There is prominent prolapsing of the muscularis propria. However, areas demonstrate definitive involvement of muscularis propria by invasive tumor (pT2). (CRR:ecj 03/14/2014) Mali RUND DO Pathologist, Electronic Signature (Case signed 03/14/2014)  Mismatch Repair (MMR) Protein Immunohistochemistry (IHC)  IHC Expression Result:  MLH1: Preserved nuclear expression (greater 50% tumor expression)  MSH2: Preserved nuclear expression (greater 50% tumor expression)  MSH6: Preserved nuclear expression (greater 50% tumor expression)  PMS2: Preserved nuclear expression (greater 50% tumor expression)  * Internal control demonstrates intact nuclear expression  Interpretation: NORMAL  ASSESSMENT: Pandora Leiter 59 y.o. female with a history of  1.Colon  Cancer, Stage I (pT2N0), MMR normal, oligo recurrent disease in liver --I discussed her se recent abdominal MRI and PET scan  and personally reviewed the scan with her. There are 2 small uptake in the liver, corresponding to a liver lesion with enhancement in segment 7 of MRI, this is highly suspicious for metastatic disease.  -I discussed the liver biopsy result with her in great details, which is consistent with metastatic colon cancer -I reviewed the nature history of metastatic colon cancer, and the potential curative approach with chemotherapy followed by liver lesion ablation or resection. However, more likely, this is not curable disease. -I have requested K-ras/NRAS mutation in her liver biopsy tissue. If her tumor does not have these mutations, she may benefit from EGFR targeted therapy -I discussed the option of first-line chemotherapy, including FOLFOX, CAPEOX, with or without Avastin. She does not want a port placement and opted CAPEOX. The possible co-pay for capecitabine was discussed with her  I'll present her case in our GI tumor Board next week - tentatively start first cycle chemotherapy in 2 weeks   2. History of PE (01/06/2014) --She has a diagnosis of pulmonary embolism, likely secondary to malignancy in the setting of family history for stroke (and possible stroke history in patient as well). Lower extremity dopplers negative.  -Given her recurrent metastatic colon cancer, I recommend her to start Xarelto.   3. History of Breast Cancer --Left CIS in situ, s/p lumpectomy in 2006 and 2011 by Dr. Margot Chimes, and radiation in 2011 by Dr. Sondra Come. Last mammogram on 01/09/14 negative -Continue yearly mammogram and a physical exam.   4.  Uterine Fibroids  --Negative endometrial biopsy. Following with Gynecology.  6. Kidney lesion (Right). -No hypermetabolic right kidney lesion on the pet scan -Repeat abdominal MRI  7. Coping  -She was quite overwhelmed by the diagnosis of metastatic  colon cancer.  -our chaplain Lattie Haw met her after   Plan  -Chemotherapy class next week  -Tentatively start first cycle CAPEOX in 2 weeks  -Capecitabine prescription will be sent to [facility] today   All questions were answered. The patient knows to call the clinic with any problems, questions or concerns. We can certainly see the patient much sooner if necessary.  I spent 35 minutes counseling the patient face to face. The total time spent in the appointment was 40 minutes.   [name]  01/04/2015

## 2015-01-05 ENCOUNTER — Other Ambulatory Visit: Payer: Self-pay | Admitting: Hematology

## 2015-01-05 ENCOUNTER — Encounter: Payer: Self-pay | Admitting: General Practice

## 2015-01-05 ENCOUNTER — Other Ambulatory Visit: Payer: Self-pay | Admitting: *Deleted

## 2015-01-05 ENCOUNTER — Encounter: Payer: Self-pay | Admitting: Hematology

## 2015-01-05 MED ORDER — POTASSIUM CHLORIDE CRYS ER 20 MEQ PO TBCR: 20.0000 meq | EXTENDED_RELEASE_TABLET | Freq: Every day | ORAL | Status: DC

## 2015-01-05 NOTE — Progress Notes (Signed)
I faxed [facility] for Xeloda [phone]

## 2015-01-05 NOTE — Telephone Encounter (Signed)
Left message for [employer]- Xeloda is on 14 days, off 7 days

## 2015-01-05 NOTE — Progress Notes (Signed)
Late entry (could not access Epic yesterday pm). Met with Veronica Reyes and husband Veronica Reyes to provide emotional and spiritual support per referral from [facility] and Dr Burr Medico.  Veronica Reyes was tearful with disappointment and disbelief at learning of the spot on her liver and resulting need for more chemo after such a rollercoaster experience already.  She took comfort in speaking with her aunt by phone, who prayed for her to offer encouragement.  Prayer is a source of coping and meaning-making for pt.  Provided pastoral presence and reflective listening, serving as a witness to her feelings as Veronica Reyes shared and began to process her story.  Normalized feelings, modeling alternative responses to "fixing" her anxiety and situation.   Provided pt with my card and Spiritual Care brochure, reminding her of ongoing chaplain availability and encouraging her to use [facility] staff/programming for nurture and support.  Please also page as needs arise.  Thank you.  [place], [place]

## 2015-01-08 ENCOUNTER — Encounter: Payer: Self-pay | Admitting: Hematology

## 2015-01-08 NOTE — Progress Notes (Signed)
I called Richard back at [employer] to advise of the cycles. He said still in review.

## 2015-01-09 ENCOUNTER — Telehealth: Payer: Self-pay | Admitting: Hematology

## 2015-01-09 NOTE — Telephone Encounter (Signed)
Left message to confirm appointment for 05/10. Mailed calendar.

## 2015-01-10 ENCOUNTER — Encounter (HOSPITAL_COMMUNITY): Payer: Self-pay

## 2015-01-10 ENCOUNTER — Encounter: Payer: Self-pay | Admitting: Hematology

## 2015-01-10 ENCOUNTER — Telehealth: Payer: Self-pay

## 2015-01-10 NOTE — Progress Notes (Signed)
Per Arit at [employer]. The patient was approved and being delivered to her on tomorrow  01/11/15. I will let Dr. Burr Medico know.

## 2015-01-10 NOTE — Telephone Encounter (Signed)
Letter stating [employer] is unable to approve capecitabine forwarded to [facility]

## 2015-01-14 HISTORY — PX: BREAST BIOPSY: SHX20

## 2015-01-18 ENCOUNTER — Other Ambulatory Visit: Payer: Self-pay | Admitting: Nurse Practitioner

## 2015-01-18 ENCOUNTER — Ambulatory Visit
Admission: RE | Admit: 2015-01-18 | Discharge: 2015-01-18 | Disposition: A | Discharge status: Home / Self Care | Payer: 59 | Source: Ambulatory Visit | Attending: Nurse Practitioner | Admitting: Nurse Practitioner

## 2015-01-18 DIAGNOSIS — Z853 Personal history of malignant neoplasm of breast: Secondary | ICD-10-CM

## 2015-01-18 DIAGNOSIS — Z9889 Other specified postprocedural states: Secondary | ICD-10-CM

## 2015-01-23 ENCOUNTER — Other Ambulatory Visit: Payer: Self-pay | Admitting: Nurse Practitioner

## 2015-01-23 ENCOUNTER — Other Ambulatory Visit (HOSPITAL_BASED_OUTPATIENT_CLINIC_OR_DEPARTMENT_OTHER): Payer: 59

## 2015-01-23 ENCOUNTER — Telehealth: Payer: Self-pay | Admitting: Hematology

## 2015-01-23 ENCOUNTER — Ambulatory Visit (HOSPITAL_BASED_OUTPATIENT_CLINIC_OR_DEPARTMENT_OTHER): Payer: 59 | Admitting: Hematology

## 2015-01-23 ENCOUNTER — Other Ambulatory Visit: Payer: 59

## 2015-01-23 ENCOUNTER — Encounter: Payer: Self-pay | Admitting: *Deleted

## 2015-01-23 ENCOUNTER — Encounter: Payer: Self-pay | Admitting: Hematology

## 2015-01-23 VITALS — BP 131/84 | HR 62 | Temp 98.9°F | Resp 18 | Ht 66.0 in | Wt 281.7 lb

## 2015-01-23 DIAGNOSIS — Z9889 Other specified postprocedural states: Secondary | ICD-10-CM

## 2015-01-23 DIAGNOSIS — C187 Malignant neoplasm of sigmoid colon: Secondary | ICD-10-CM | POA: Diagnosis not present

## 2015-01-23 DIAGNOSIS — Z86711 Personal history of pulmonary embolism: Secondary | ICD-10-CM

## 2015-01-23 DIAGNOSIS — C787 Secondary malignant neoplasm of liver and intrahepatic bile duct: Secondary | ICD-10-CM

## 2015-01-23 DIAGNOSIS — Z853 Personal history of malignant neoplasm of breast: Secondary | ICD-10-CM

## 2015-01-23 DIAGNOSIS — D259 Leiomyoma of uterus, unspecified: Secondary | ICD-10-CM | POA: Diagnosis not present

## 2015-01-23 DIAGNOSIS — N289 Disorder of kidney and ureter, unspecified: Secondary | ICD-10-CM | POA: Diagnosis not present

## 2015-01-23 LAB — COMPREHENSIVE METABOLIC PANEL (CC13)
ALT: 47 U/L (ref 0–55)
ANION GAP: 11 meq/L (ref 3–11)
AST: 62 U/L — ABNORMAL HIGH (ref 5–34)
Albumin: 3.5 g/dL (ref 3.5–5.0)
Alkaline Phosphatase: 44 U/L (ref 40–150)
BILIRUBIN TOTAL: 0.65 mg/dL (ref 0.20–1.20)
BUN: 12 mg/dL (ref 7.0–26.0)
CO2: 25 meq/L (ref 22–29)
Calcium: 9.5 mg/dL (ref 8.4–10.4)
Chloride: 104 mEq/L (ref 98–109)
Creatinine: 0.9 mg/dL (ref 0.6–1.1)
EGFR: 86 mL/min/{1.73_m2} — AB (ref >90)
GLUCOSE: 93 mg/dL (ref 70–140)
Potassium: 3.7 mEq/L (ref 3.5–5.1)
Sodium: 139 mEq/L (ref 136–145)
Total Protein: 7.7 g/dL (ref 6.4–8.3)

## 2015-01-23 LAB — CBC & DIFF AND RETIC
BASO%: 0.4 % (ref 0.0–2.0)
BASOS ABS: 0 10*3/uL (ref 0.0–0.1)
EOS ABS: 0.1 10*3/uL (ref 0.0–0.5)
EOS%: 1 % (ref 0.0–7.0)
HCT: 43.3 % (ref 34.8–46.6)
HGB: 14.7 g/dL (ref 11.6–15.9)
IMMATURE RETIC FRACT: 8.1 % (ref 1.60–10.00)
LYMPH#: 3.7 10*3/uL — AB (ref 0.9–3.3)
LYMPH%: 44.6 % (ref 14.0–49.7)
MCH: 31.9 pg (ref 25.1–34.0)
MCHC: 33.9 g/dL (ref 31.5–36.0)
MCV: 93.9 fL (ref 79.5–101.0)
MONO#: 0.5 10*3/uL (ref 0.1–0.9)
MONO%: 5.8 % (ref 0.0–14.0)
NEUT%: 48.2 % (ref 38.4–76.8)
NEUTROS ABS: 4 10*3/uL (ref 1.5–6.5)
Platelets: 258 10*3/uL (ref 145–400)
RBC: 4.61 10*6/uL (ref 3.70–5.45)
RDW: 14.1 % (ref 11.2–14.5)
Retic %: 2.12 % — ABNORMAL HIGH (ref 0.70–2.10)
Retic Ct Abs: 97.73 10*3/uL — ABNORMAL HIGH (ref 33.70–90.70)
WBC: 8.3 10*3/uL (ref 3.9–10.3)
nRBC: 0 % (ref 0–0)

## 2015-01-23 NOTE — Progress Notes (Signed)
[facility] PROGRESS NOTE DATE OF VISIT: 06/27/2014  [name]., FNP [name] [place] [id number]  DIAGNOSIS: recurrent Cancer of sigmoid colon - Plan: CT Abdomen Pelvis W Contrast, CT Chest W Contrast  PROBLEM LIST 1. Left breast DCIS diagnosed in 2006 and 2010, status post lumpectomy. 2. pT2N0M0 stage I colon cancer, s/p partial colectomy on 03/09/2014  3. PE in 12/2013 when she was diagnosed with colon cancer.  Oncology History   Cancer of sigmoid colon   Staging form: Colon and Rectum, AJCC 7th Edition     Clinical: Stage I (T2, N0, M0) - Unsigned       Cancer of sigmoid colon   03/04/2014 Initial Diagnosis Cancer of sigmoid colon   03/09/2014 Pathology Results G1 invasive adenocarcinoma, no lymph-Vascular invasion or Peri-neural invasion: Absent. MMR normal, MSI stable.    03/09/2014 Surgery partial colectomy, negative margins.    11/10/2014 Relapse/Recurrence biopsy confirmed oligo liver recurrence    11/10/2014 Imaging PET showed two small ares of hypermetabolism in the right lobe of the liver, no other distant metastasis.    11/30/2014 Imaging abdomen MRI showed a new enhancing lesion which corresponds to an area of abnormal hyper metabolism on recent PET-CT. Stable enlarged portal caval lymph node.    01/02/2015 Pathology Results Liver biopsy showed metastatic adenocarcinoma, IHC (+) for CK20, CDX-2, (-) for TTF-1 and ER   01/02/2015 Miscellaneous liver biopsy KRAS mutation (+)      CURRENT TREATMENT:  Xalreto 20 mg daily.   INTERVAL HISTORY:  Veronica Reyes 59 y.o. female with a history of breast cancer, colon cancer s/p laparoscopic-assisted partial colectomy (03/09/2014), PE (01/06/2014) , presents to clinic with her husband for follow up.   She is much more cheerful today, she has started using Hamp oil  one week ago, which is a substrate from marijuana. She takes 3 teaspoon daily. She noticed significant  improvement in her energy level and appetite, and able to go up and down stairs more easily. She would like to hold off the chemotherapy for now and try it for additional 3 weeks. She denies any other new symptoms. She had screening mammogram a few weeks ago, which showed a small lesion in the right breast, she is scheduled for breast biopsy tomorrow.   MEDICAL HISTORY: Past Medical History  Diagnosis Date  . Hypertension   . Cancer     left breast cancer x2  . Breast CA     radiation and surgery  . Shortness of breath     sob with exertion. dx. with Pulmonary emboli 4'15 ,tx. Xarelto,Lovenox used for [employer].  . Pulmonary embolism     "blood clot in lungs" -dx. 4'15 with CT Chest.  . Colon cancer   . Liver lesion     ALLERGIES:  is allergic to tramadol.  MEDICATIONS:    Medication List       This list is accurate as of: 01/23/15  4:20 PM.  Always use your most recent med list.               amLODipine 2.5 MG tablet  Commonly known as:  NORVASC  Take 2.5 mg by mouth daily.     capecitabine 500 MG tablet  Commonly known as:  XELODA  Take 5 tablets (2,500 mg total) by mouth 2 (two) times daily after a meal. Take on days 1-14 of chemotherapy.     ferrous sulfate 325 (65 FE) MG tablet  Take 325 mg by mouth every morning.     hydrochlorothiazide 25 MG tablet  Commonly known as:  HYDRODIURIL  Take 25 mg by mouth every morning.     metoprolol 50 MG tablet  Commonly known as:  LOPRESSOR  Take 50 mg by mouth 2 (two) times daily.     ondansetron 8 MG tablet  Commonly known as:  ZOFRAN  Take 1 tablet (8 mg total) by mouth 2 (two) times daily. Start the day after chemo for 2 days. Then take as needed for nausea or vomiting.     potassium chloride SA 20 MEQ tablet  Commonly known as:  K-DUR,KLOR-CON  Take 1 tablet (20 mEq total) by mouth daily.     rivaroxaban 20 MG Tabs tablet  Commonly known as:  XARELTO  Take 1 tablet (20 mg total) by mouth daily with supper.         SURGICAL HISTORY:  Past Surgical History  Procedure Laterality Date  . Cesarean section    . Breast surgery      '11- Left breast lumpectomy  . Cholecystectomy      Laparoscopic 20 yrs ago  . Partial colectomy N/A 03/09/2014    Procedure: LAPAROSCOPIC ASSISTED PARTIAL COLECTOMY, splenic flexure mobilization;  Surgeon: Leighton Ruff, MD;  Location: [facility];  Service: General;  Laterality: N/A;    REVIEW OF SYSTEMS:   Constitutional: Denies fevers, chills or abnormal weight loss Eyes: Denies blurriness of vision Ears, nose, mouth, throat, and face: Denies mucositis or sore throat Respiratory: Denies cough, dyspnea or wheezes Cardiovascular: Denies palpitation, chest discomfort or lower extremity swelling Gastrointestinal:  Denies nausea, heartburn or change in bowel habits Skin: Denies abnormal skin rashes Lymphatics: Denies new lymphadenopathy or easy bruising Neurological:Denies numbness, tingling or new weaknesses Behavioral/Psych: Mood is stable, no new changes  All other systems were reviewed with the patient and are negative.  PHYSICAL EXAMINATION: ECOG PERFORMANCE STATUS: 0  Blood pressure 131/84, pulse 62, temperature 98.9 F (37.2 C), temperature source Oral, resp. rate 18, height '5\' 6"'  (1.676 m), weight 281 lb 11.2 oz (127.778 kg), last menstrual period 01/23/2014.  GENERAL:alert, no distress and comfortable; well developed and obese. Easily mobile to exam table.  SKIN: skin color, texture, turgor are normal, no rashes or significant lesions EYES: normal, Conjunctiva are pink and non-injected, sclera clear OROPHARYNX:no exudate, no erythema and lips, buccal mucosa, and tongue normal  NECK: supple, thyroid normal size, non-tender, without nodularity LYMPH:  no palpable lymphadenopathy in the cervical, axillary or supraclavicular LUNGS: clear to auscultation with normal breathing effort, no wheezes or rhonchi HEART: regular rate & rhythm and no murmurs and no lower  extremity edema ABDOMEN:abdomen soft, non-tender and normal bowel sounds; incision healing with signs of infection. Rectal exam negative for palpable lesion, no blood on glove  Musculoskeletal:no cyanosis of digits and no clubbing  NEURO: alert & oriented x 3 with fluent speech, no focal motor/sensory deficits  Labs:  CBC Latest Ref Rng 01/23/2015 01/02/2015 11/17/2014  WBC 3.9 - 10.3 10e3/uL 8.3 7.6 9.4  Hemoglobin 11.6 - 15.9 g/dL 14.7 13.0 14.5  Hematocrit 34.8 - 46.6 % 43.3 40.2 43.2  Platelets 145 - 400 10e3/uL 258 222 254    CMP Latest Ref Rng 01/23/2015 11/17/2014 10/20/2014  Glucose 70 - 140 mg/dl 93 93 104  BUN 7.0 - 26.0 mg/dL 12.0 11.1 12.5  Creatinine 0.6 - 1.1 mg/dL 0.9 0.8 0.9  Sodium 136 - 145 mEq/L 139 143 142  Potassium 3.5 - 5.1  mEq/L 3.7 4.0 3.3(L)  Chloride 96 - 112 mEq/L - - -  CO2 22 - 29 mEq/L '25 24 29  ' Calcium 8.4 - 10.4 mg/dL 9.5 9.8 9.4  Total Protein 6.4 - 8.3 g/dL 7.7 7.9 7.9  Total Bilirubin 0.20 - 1.20 mg/dL 0.65 0.47 0.59  Alkaline Phos 40 - 150 U/L 44 45 49  AST 5 - 34 U/L 62(H) 30 52(H)  ALT 0 - 55 U/L 47 31 44   CEA (Order [id number])      CEA  Status: Finalresult Visible to patient:  MyChart Nextappt: 01/11/2015 at 02:50 PM in Radiology ([facility]) Dx:  Cancer of sigmoid colon s/p colectomy...           Ref Range 18moago  178mogo     CEA 0.0 - 5.0 ng/mL 1.2 0.8   Resulting Agency  [facility]         PATHOLOGY RESULT Diagnosis 01/02/2015 Liver, needle/core biopsy, right - POSITIVE FOR METASTATIC ADENOCARCINOMA. - SEE COMMENT. Microscopic Comment Immunohistochemical stains are performed. The tumor is positive for cytokeratin 20 and CDX-2. Cytokeratin 7 stains background bile ducts but is negative within the tumor. The tumor is negative for TTF-1 and estrogen receptor. The morphology coupled with the staining pattern is consistent with metastatic adenocarcinoma of colorectal  primary source.  Diagnosis 1. Colon, segmental resection for tumor, sigmoid 03/09/2014 - INVASIVE ADENOCARCINOMA, SEE COMMENT. - TUMOR EXTENDS INTO MUSCULARIS PROPRIA. - NEGATIVE FOR LYMPH VASCULAR INVASION. Diagnosis(continued) - NEGATIVE FOR PERINEURAL INVASION.- VISCERAL PERITONEUM, NEGATIVE FOR TUMOR.- FOURTEEN LYMPH NODES, NEGATIVE FOR TUMOR (0/14).- SURGICAL RESECTION MARGINS, NEGATIVE FOR TUMOR.- ENDOSCOPIC TATTOOING IDENTIFIED.- SEE TUMOR SYNOPTIC TEMPLATE BELOW. 2. Colon, resection margin (donut) - BENIGN COLON. - NEGATIVE FOR DYSPLASIA OR MALIGNANCY. Microscopic Comment 1. COLON AND RECTUM (INCLUDING TRANS-ANAL RESECTION): Specimen: Sigmoid colon. Procedure: Resection. Tumor site: Mesenteric-antimesenteric junction. Specimen integrity: Intact. Macroscopic intactness of mesorectum: Not applicable. Macroscopic tumor perforation: Absent. Invasive tumor: Maximum size: See comment. Histologic type(s): Adenocarcinoma. Histologic grade and differentiation: G1: well differentiated/low grade Type of polyp in which invasive carcinoma arose: Tubulovillous adenoma with high grade dysplasia. Microscopic extension of invasive tumor: Tumor focally extends into muscularis propria, see comment. Lymph-Vascular invasion: Absent. Peri-neural invasion: Absent. Tumor deposit(s) (discontinuous extramural extension): Absent. Resection margins: Proximal margin: Negative. Distal margin: Negative. Circumferential (radial) (posterior ascending, posterior descending; lateral and posterior mid-rectum; and entire lower 1/3 rectum): Negative.Mesenteric margin (sigmoid and transverse): Negative. Distance closest margin (if all above margins negative): Mesenteric margin (5 cm). Treatment effect (neo-adjuvant therapy): None. Additional polyp(s): None. Non-neoplastic findings: None. Lymph nodes: number examined 14; number positive: 0 Pathologic Staging: pT2, pN0, pMX Ancillary studies: Per the [place] gastrointestinal  working group guidelines, tissue will be submitted for microsatellite instability by PCR as well as mismatch repair protein expression by immunohistochemistry. Comments: The mass is entirely submitted for review. There are foci of invasive adenocarcinoma arising in a background of a polypoid tubular adenoma with high grade dysplasia. There is prominent prolapsing of the muscularis propria. However, areas demonstrate definitive involvement of muscularis propria by invasive tumor (pT2). (CRR:ecj 03/14/2014) CHMaliUND DO Pathologist, Electronic Signature (Case signed 03/14/2014)  Mismatch Repair (MMR) Protein Immunohistochemistry (IHC)  IHC Expression Result:  MLH1: Preserved nuclear expression (greater 50% tumor expression)  MSH2: Preserved nuclear expression (greater 50% tumor expression)  MSH6: Preserved nuclear expression (greater 50% tumor expression)  PMS2: Preserved nuclear expression (greater 50% tumor expression)  * Internal control demonstrates intact nuclear expression  Interpretation: NORMAL  RADIOGRAPHIC STUDIES: MRI I abdomen with and without contrast 11/30/2014 IMPRESSION: 1. Within segment 7 of the liver there is a new enhancing lesion which corresponds to an area of abnormal hyper metabolism on recent PET-CT. This is concerning for metastatic disease. 2. Stable enlarged portal caval lymph node that has a short axis dimension measuring 2.4 cm. 3. Slight increase in size of Bosniak category 2 F lesion arising from the interpolar right kidney. Followup examination in 12 months is advised.  PET 11/10/2014  IMPRESSION: 1. Status post sigmoidectomy with two small areas of hypermetabolism in the right lobe of the liver, which are concerning for potential metastatic lesions, as above. This could be confirmed with MRI of the abdomen with and without IV gadolinium. 2. There is some relatively diffuse hypermetabolism in the rectum, which is favored to be either physiologic or related  to a proctitis. No focal mass like soft tissue is noted adjacent to the suture line to suggest local recurrence of disease. 3. Small focus of ground-glass attenuation in the medial aspect of the right lower lobe, favored to reflect developing fibrosis related to adjacent large marginal osteophytes in the thoracic spine (reference: AJR Am J Roentgenol. 2002 Oct;179(4):893-6.). There is some low-level hypermetabolism in this region at this time, however, this finding is strongly favored to be benign. Attention at time of follow-up imaging is recommended to ensure the stability or resolution of these findings. 4. Dilatation of the pulmonic trunk (3.9 cm in diameter), suggesting pulmonary arterial hypertension.   ASSESSMENT: Sharolyn Douglas 58 y.o. female with a history of  1.Colon Cancer, Stage I (pT2N0), MMR normal, oligo recurrent disease in liver in 12/2014, KRAS mutation (+) --I discussed her se recent abdominal MRI and PET scan  and personally reviewed the scan with her. There are 2 small uptake in the liver, corresponding to a liver lesion with enhancement in segment 7 of MRI, this is highly suspicious for metastatic disease.  -I discussed the liver biopsy result with her in great details, which is consistent with metastatic colon cancer -I reviewed the nature history of metastatic colon cancer, and the potential curative approach with chemotherapy followed by liver lesion ablation or resection. However, more likely, this is not curable disease. -E her tumor has K-ras mutation, so she will not benefit from EGFR targeted therapy -I discussed the option of first-line chemotherapy, including FOLFOX, CAPEOX, with or without Avastin. She does not want a port placement and opted CAPEOX. Capecitabine was approved by her insurance and she has received it.   -Her case was discussed in our GI tumor Board, and the recommendation is systemic chemotherapy followed by surgical resection for the liver  lesion  -She would like to hold on chemotherapy for additional 3 weeks and try the Hamp oil. I discussed the possibility of tumor gross metastasis if holding treatment. She voiced good understanding. -I'll reschedule her chemotherapy to 3 weeks later. Her last scan was in March, I'll obtain a CT chest abdomen and pelvis before she starts chemotherapy.  2. History of PE (01/06/2014) --She has a diagnosis of pulmonary embolism, likely secondary to malignancy in the setting of family history for stroke (and possible stroke history in patient as well). Lower extremity dopplers negative.  -Given her recurrent metastatic colon cancer, I recommended her to start Xarelto.   3. History of Breast Cancer --Left CIS in situ, s/p lumpectomy in 2006 and 2011 by Dr. Margot Chimes, and radiation in 2011 by Dr. Sondra Come. Last mammogram on 01/09/14 negative -She  is scheduled to have right breast biopsy due to the abnormal screening mammogram.  4.  Uterine Fibroids  --Negative endometrial biopsy. Following with Gynecology.  6. Kidney lesion (Right). -No hypermetabolic right kidney lesion on the pet scan -Repeat abdominal MRI  7. Coping  -She was quite overwhelmed by the diagnosis of metastatic colon cancer.  -She is coping much better now, and she has good support from her husband  Plan  -Return to clinic r for follow-up and first cycle CAPEOX in 3 weeks  -CT chest, abdomen and pelvis with contrast before next visit  All questions were answered. The patient knows to call the clinic with any problems, questions or concerns. We can certainly see the patient much sooner if necessary.  I spent 25 minutes counseling the patient face to face. The total time spent in the appointment was 30 minutes.   Truitt Veronica  01/23/2015

## 2015-01-23 NOTE — Telephone Encounter (Signed)
Pt confirmed labs/ov per 05/10 POF, gave pt AVS and Calendar..... KJ, sent msg to add chemo and to cancel 05/12 treatment date due to pt wants to start in 3 wks instead...Marland KitchenMarland Kitchen

## 2015-01-24 ENCOUNTER — Inpatient Hospital Stay: Admission: RE | Admit: 2015-01-24 | Payer: 59 | Source: Ambulatory Visit

## 2015-01-25 ENCOUNTER — Ambulatory Visit: Payer: 59

## 2015-02-01 ENCOUNTER — Ambulatory Visit
Admission: RE | Admit: 2015-02-01 | Discharge: 2015-02-01 | Disposition: A | Discharge status: Home / Self Care | Payer: 59 | Source: Ambulatory Visit | Attending: Nurse Practitioner | Admitting: Nurse Practitioner

## 2015-02-01 DIAGNOSIS — Z9889 Other specified postprocedural states: Secondary | ICD-10-CM

## 2015-02-01 DIAGNOSIS — Z853 Personal history of malignant neoplasm of breast: Secondary | ICD-10-CM

## 2015-02-06 ENCOUNTER — Telehealth: Payer: Self-pay | Admitting: Hematology

## 2015-02-06 NOTE — Telephone Encounter (Signed)
per pt cld stated she wanted to CX chemo for 5/26 until after her scans-CX appt

## 2015-02-08 ENCOUNTER — Telehealth: Payer: Self-pay | Admitting: Hematology

## 2015-02-08 ENCOUNTER — Other Ambulatory Visit: Payer: Self-pay | Admitting: *Deleted

## 2015-02-08 ENCOUNTER — Ambulatory Visit: Payer: 59

## 2015-02-08 ENCOUNTER — Other Ambulatory Visit: Payer: Self-pay | Admitting: Hematology

## 2015-02-08 ENCOUNTER — Telehealth: Payer: Self-pay | Admitting: *Deleted

## 2015-02-08 NOTE — Telephone Encounter (Signed)
Per staff message and POF I have scheduled appts. Advised scheduler of appts. JMW  

## 2015-02-08 NOTE — Telephone Encounter (Signed)
cld pt and adv of appt time & date for 6/7

## 2015-02-08 NOTE — Telephone Encounter (Signed)
per Thu Dr Burr Medico did pof and did not push send-sent MW email to sch p trmt-r/s pt lab-will call pt after reply

## 2015-02-13 ENCOUNTER — Other Ambulatory Visit: Payer: Self-pay | Admitting: Hematology

## 2015-02-13 ENCOUNTER — Ambulatory Visit (HOSPITAL_COMMUNITY)
Admission: RE | Admit: 2015-02-13 | Discharge: 2015-02-13 | Disposition: A | Discharge status: Home / Self Care | Payer: 59 | Source: Ambulatory Visit | Attending: Hematology | Admitting: Hematology

## 2015-02-13 ENCOUNTER — Encounter (HOSPITAL_COMMUNITY): Payer: Self-pay

## 2015-02-13 DIAGNOSIS — C187 Malignant neoplasm of sigmoid colon: Secondary | ICD-10-CM

## 2015-02-13 DIAGNOSIS — C50912 Malignant neoplasm of unspecified site of left female breast: Secondary | ICD-10-CM

## 2015-02-13 DIAGNOSIS — Z85038 Personal history of other malignant neoplasm of large intestine: Secondary | ICD-10-CM | POA: Diagnosis present

## 2015-02-13 DIAGNOSIS — Z853 Personal history of malignant neoplasm of breast: Secondary | ICD-10-CM | POA: Insufficient documentation

## 2015-02-13 DIAGNOSIS — K769 Liver disease, unspecified: Secondary | ICD-10-CM | POA: Diagnosis not present

## 2015-02-13 MED ORDER — IOHEXOL 300 MG/ML  SOLN
100.0000 mL | Freq: Once | INTRAMUSCULAR | Status: AC | PRN
  Administered 2015-02-13: 100 mL via INTRAVENOUS

## 2015-02-13 MED ORDER — POTASSIUM CHLORIDE CRYS ER 20 MEQ PO TBCR: 20.0000 meq | EXTENDED_RELEASE_TABLET | Freq: Every day | ORAL | Status: DC

## 2015-02-15 ENCOUNTER — Other Ambulatory Visit: Payer: 59

## 2015-02-19 ENCOUNTER — Telehealth: Payer: Self-pay | Admitting: *Deleted

## 2015-02-19 NOTE — Telephone Encounter (Signed)
VM message from pt received @ 1044am. Call back to patient and she states she is out of town in [place] and will not be able to be at her appts tomorrow. She will be back in town on Thursday. She needs her labs, visit with Dr. Burr Medico and her chemo appts changed.

## 2015-02-19 NOTE — Telephone Encounter (Signed)
Left message with [employer]/Scheduler to call pt to r/s.

## 2015-02-20 ENCOUNTER — Telehealth: Payer: Self-pay | Admitting: Hematology

## 2015-02-20 ENCOUNTER — Ambulatory Visit: Payer: 59 | Admitting: Hematology

## 2015-02-20 ENCOUNTER — Other Ambulatory Visit: Payer: Self-pay | Admitting: *Deleted

## 2015-02-20 ENCOUNTER — Ambulatory Visit: Payer: 59

## 2015-02-20 ENCOUNTER — Other Ambulatory Visit: Payer: 59

## 2015-02-20 NOTE — Telephone Encounter (Signed)
Left message to confirm appointment moved from 06/09 to 06/16.

## 2015-02-20 NOTE — Telephone Encounter (Signed)
Left message to confirm appointment 06/09.

## 2015-02-22 ENCOUNTER — Ambulatory Visit: Payer: 59 | Admitting: Hematology

## 2015-02-22 ENCOUNTER — Other Ambulatory Visit: Payer: 59

## 2015-02-22 ENCOUNTER — Ambulatory Visit: Payer: 59

## 2015-03-01 ENCOUNTER — Other Ambulatory Visit (HOSPITAL_BASED_OUTPATIENT_CLINIC_OR_DEPARTMENT_OTHER): Payer: 59

## 2015-03-01 ENCOUNTER — Ambulatory Visit (HOSPITAL_BASED_OUTPATIENT_CLINIC_OR_DEPARTMENT_OTHER): Payer: 59 | Admitting: Hematology

## 2015-03-01 ENCOUNTER — Telehealth: Payer: Self-pay | Admitting: Hematology

## 2015-03-01 ENCOUNTER — Encounter: Payer: Self-pay | Admitting: Hematology

## 2015-03-01 VITALS — BP 155/79 | HR 67 | Temp 98.2°F | Resp 20 | Ht 65.0 in | Wt 280.0 lb

## 2015-03-01 DIAGNOSIS — C787 Secondary malignant neoplasm of liver and intrahepatic bile duct: Secondary | ICD-10-CM | POA: Diagnosis not present

## 2015-03-01 DIAGNOSIS — C187 Malignant neoplasm of sigmoid colon: Secondary | ICD-10-CM

## 2015-03-01 DIAGNOSIS — Z853 Personal history of malignant neoplasm of breast: Secondary | ICD-10-CM

## 2015-03-01 DIAGNOSIS — N289 Disorder of kidney and ureter, unspecified: Secondary | ICD-10-CM

## 2015-03-01 DIAGNOSIS — Z86711 Personal history of pulmonary embolism: Secondary | ICD-10-CM

## 2015-03-01 DIAGNOSIS — C50911 Malignant neoplasm of unspecified site of right female breast: Secondary | ICD-10-CM

## 2015-03-01 LAB — CBC & DIFF AND RETIC
BASO%: 0.5 % (ref 0.0–2.0)
Basophils Absolute: 0 10*3/uL (ref 0.0–0.1)
EOS%: 1.1 % (ref 0.0–7.0)
Eosinophils Absolute: 0.1 10*3/uL (ref 0.0–0.5)
HCT: 42.6 % (ref 34.8–46.6)
HGB: 14.5 g/dL (ref 11.6–15.9)
Immature Retic Fract: 3.1 % (ref 1.60–10.00)
LYMPH%: 41.6 % (ref 14.0–49.7)
MCH: 31.9 pg (ref 25.1–34.0)
MCHC: 34 g/dL (ref 31.5–36.0)
MCV: 93.8 fL (ref 79.5–101.0)
MONO#: 0.6 10*3/uL (ref 0.1–0.9)
MONO%: 6.7 % (ref 0.0–14.0)
NEUT#: 4.3 10*3/uL (ref 1.5–6.5)
NEUT%: 50.1 % (ref 38.4–76.8)
Platelets: 223 10*3/uL (ref 145–400)
RBC: 4.54 10*6/uL (ref 3.70–5.45)
RDW: 13.4 % (ref 11.2–14.5)
Retic %: 2.04 % (ref 0.70–2.10)
Retic Ct Abs: 92.62 10*3/uL — ABNORMAL HIGH (ref 33.70–90.70)
WBC: 8.5 10*3/uL (ref 3.9–10.3)
lymph#: 3.5 10*3/uL — ABNORMAL HIGH (ref 0.9–3.3)

## 2015-03-01 LAB — COMPREHENSIVE METABOLIC PANEL (CC13)
ALT: 41 U/L (ref 0–55)
ANION GAP: 7 meq/L (ref 3–11)
AST: 48 U/L — AB (ref 5–34)
Albumin: 3.3 g/dL — ABNORMAL LOW (ref 3.5–5.0)
Alkaline Phosphatase: 45 U/L (ref 40–150)
BILIRUBIN TOTAL: 0.68 mg/dL (ref 0.20–1.20)
BUN: 9 mg/dL (ref 7.0–26.0)
CHLORIDE: 104 meq/L (ref 98–109)
CO2: 30 mEq/L — ABNORMAL HIGH (ref 22–29)
Calcium: 9.3 mg/dL (ref 8.4–10.4)
Creatinine: 0.9 mg/dL (ref 0.6–1.1)
EGFR: 86 mL/min/{1.73_m2} — ABNORMAL LOW (ref >90)
Glucose: 94 mg/dl (ref 70–140)
Potassium: 3.3 mEq/L — ABNORMAL LOW (ref 3.5–5.1)
SODIUM: 140 meq/L (ref 136–145)
Total Protein: 7.5 g/dL (ref 6.4–8.3)

## 2015-03-01 NOTE — Telephone Encounter (Signed)
Gave and printed appt sched and avs fo rpt for June and JULy

## 2015-03-01 NOTE — Progress Notes (Signed)
[facility] PROGRESS NOTE DATE OF VISIT: 06/27/2014  Veronica Reyes., FNP [address] [place] [id number]  DIAGNOSIS: recurrent Cancer of sigmoid colon  Breast cancer, right  PROBLEM LIST 1. Left breast DCIS diagnosed in 2006 and 2010, status post lumpectomy. 2. pT2N0M0 stage I colon cancer, s/p partial colectomy on 03/09/2014  3. PE in 12/2013 when she was diagnosed with colon cancer.  Oncology History   Cancer of sigmoid colon   Staging form: Colon and Rectum, AJCC 7th Edition     Clinical: Stage I (T2, N0, M0) - Unsigned       Breast cancer   2006 Cancer Diagnosis Left breast DCIS diagnosed in 2006 and 2010, status post lumpectomy.   01/07/2014 Initial Diagnosis Breast cancer   01/18/2015 Mammogram 39m mass in lower outer quadrant of right breast    02/01/2015 Initial Biopsy right breast mass biopsy showed invasive ductal carcinoma    02/01/2015 Receptors her2 ER 90%+, PR 10%+, kI67 10%, HER2 pending     Cancer of sigmoid colon   03/04/2014 Initial Diagnosis Cancer of sigmoid colon   03/09/2014 Pathology Results G1 invasive adenocarcinoma, no lymph-Vascular invasion or Peri-neural invasion: Absent. MMR normal, MSI stable.    03/09/2014 Surgery partial colectomy, negative margins.    11/10/2014 Relapse/Recurrence biopsy confirmed oligo liver recurrence    11/10/2014 Imaging PET showed two small ares of hypermetabolism in the right lobe of the liver, no other distant metastasis.    11/30/2014 Imaging abdomen MRI showed a new enhancing lesion which corresponds to an area of abnormal hyper metabolism on recent PET-CT. Stable enlarged portal caval lymph node.    01/02/2015 Pathology Results Liver biopsy showed metastatic adenocarcinoma, IHC (+) for CK20, CDX-2, (-) for TTF-1 and ER   01/02/2015 Miscellaneous liver biopsy KRAS mutation (+)      CURRENT TREATMENT:  Xalreto 20 mg daily.   INTERVAL HISTORY:  Veronica BOREL59y.o.  female with a history of breast cancer, colon cancer s/p laparoscopic-assisted partial colectomy (03/09/2014), PE (01/06/2014) , presents to clinic with her husband for follow up.   She states she is doing well overall. She takes Hamp oil, which is a substrate from marijuana. She takes 3 teaspoon daily. She noticed significant improvement in her energy level and appetite.  She denies any pain, nausea, we'll change of her bowel habits. Denies any bleeding.  She underwent right breast biopsy , which unfortunately showed invasive ductal carcinoma.  MEDICAL HISTORY: Past Medical History  Diagnosis Date  . Hypertension   . Shortness of breath     sob with exertion. dx. with Pulmonary emboli 4'15 ,tx. Xarelto,Lovenox used for [employer]  . Pulmonary embolism     "blood clot in lungs" -dx. 4'15 with CT Chest.  . Liver lesion   . Cancer     left breast cancer x2  . Breast CA     radiation and surgery-lt.  . Colon cancer     ALLERGIES:  is allergic to tramadol.  MEDICATIONS:    Medication List       This list is accurate as of: 03/01/15 12:20 PM.  Always use your most recent med list.               amLODipine 2.5 MG tablet  Commonly known as:  NORVASC  Take 2.5 mg by mouth daily.     capecitabine 500 MG tablet  Commonly known as:  XELODA  Take 5 tablets (2,500 mg  total) by mouth 2 (two) times daily after a meal. Take on days 1-14 of chemotherapy.     ferrous sulfate 325 (65 FE) MG tablet  Take 325 mg by mouth every morning.     hydrochlorothiazide 25 MG tablet  Commonly known as:  HYDRODIURIL  Take 25 mg by mouth every morning.     metoprolol 50 MG tablet  Commonly known as:  LOPRESSOR  Take 50 mg by mouth 2 (two) times daily.     ondansetron 8 MG tablet  Commonly known as:  ZOFRAN  Take 1 tablet (8 mg total) by mouth 2 (two) times daily. Start the day after chemo for 2 days. Then take as needed for nausea or vomiting.     potassium chloride SA 20 MEQ tablet  Commonly  known as:  K-DUR,KLOR-CON  Take 1 tablet (20 mEq total) by mouth daily.     rivaroxaban 20 MG Tabs tablet  Commonly known as:  XARELTO  Take 1 tablet (20 mg total) by mouth daily with supper.        SURGICAL HISTORY:  Past Surgical History  Procedure Laterality Date  . Cesarean section    . Breast surgery      '11- Left breast lumpectomy  . Cholecystectomy      Laparoscopic 20 yrs ago  . Partial colectomy N/A 03/09/2014    Procedure: LAPAROSCOPIC ASSISTED PARTIAL COLECTOMY, splenic flexure mobilization;  Surgeon: Leighton Ruff, MD;  Location: [facility];  Service: General;  Laterality: N/A;    REVIEW OF SYSTEMS:   Constitutional: Denies fevers, chills or abnormal weight loss Eyes: Denies blurriness of vision Ears, nose, mouth, throat, and face: Denies mucositis or sore throat Respiratory: Denies cough, dyspnea or wheezes Cardiovascular: Denies palpitation, chest discomfort or lower extremity swelling Gastrointestinal:  Denies nausea, heartburn or change in bowel habits Skin: Denies abnormal skin rashes Lymphatics: Denies new lymphadenopathy or easy bruising Neurological:Denies numbness, tingling or new weaknesses Behavioral/Psych: Mood is stable, no new changes  All other systems were reviewed with the patient and are negative. Breasts: Breast inspection showed them to be symmetrical with no nipple discharge. Palpation of the breasts and axilla revealed no obvious mass that I could appreciate.   Old surgical scar in the left breast. (+)  Skin erythema below both breasts   PHYSICAL EXAMINATION: ECOG PERFORMANCE STATUS: 0  Blood pressure 155/79, pulse 67, temperature 98.2 F (36.8 C), temperature source Oral, resp. rate 20, height _0  (1.651 m), weight 280 lb (127.007 kg), last menstrual period 01/23/2014, SpO2 100 %.  GENERAL:alert, no distress and comfortable; well developed and obese. Easily mobile to exam table.  SKIN: skin color, texture, turgor are normal, no rashes or  significant lesions EYES: normal, Conjunctiva are pink and non-injected, sclera clear OROPHARYNX:no exudate, no erythema and lips, buccal mucosa, and tongue normal  NECK: supple, thyroid normal size, non-tender, without nodularity LYMPH:  no palpable lymphadenopathy in the cervical, axillary or supraclavicular LUNGS: clear to auscultation with normal breathing effort, no wheezes or rhonchi HEART: regular rate & rhythm and no murmurs and no lower extremity edema ABDOMEN:abdomen soft, non-tender and normal bowel sounds; incision healing with signs of infection. Rectal exam negative for palpable lesion, no blood on glove  Musculoskeletal:no cyanosis of digits and no clubbing  NEURO: alert & oriented x 3 with fluent speech, no focal motor/sensory deficits  Labs:  CBC Latest Ref Rng 03/01/2015 01/23/2015 01/02/2015  WBC 3.9 - 10.3 10e3/uL 8.5 8.3 7.6  Hemoglobin 11.6 - 15.9 g/dL  14.5 14.7 13.0  Hematocrit 34.8 - 46.6 % 42.6 43.3 40.2  Platelets 145 - 400 10e3/uL 223 258 222    CMP Latest Ref Rng 03/01/2015 01/23/2015 11/17/2014  Glucose 70 - 140 mg/dl 94 93 93  BUN 7.0 - 26.0 mg/dL 9.0 12.0 11.1  Creatinine 0.6 - 1.1 mg/dL 0.9 0.9 0.8  Sodium 136 - 145 mEq/L 140 139 143  Potassium 3.5 - 5.1 mEq/L 3.3(L) 3.7 4.0  Chloride 96 - 112 mEq/L - - -  CO2 22 - 29 mEq/L 30(H) 25 24  Calcium 8.4 - 10.4 mg/dL 9.3 9.5 9.8  Total Protein 6.4 - 8.3 g/dL 7.5 7.7 7.9  Total Bilirubin 0.20 - 1.20 mg/dL 0.68 0.65 0.47  Alkaline Phos 40 - 150 U/L 45 44 45  AST 5 - 34 U/L 48(H) 62(H) 30  ALT 0 - 55 U/L 41 47 31    PATHOLOGY RESULT Diagnosis 01/02/2015 Liver, needle/core biopsy, right - POSITIVE FOR METASTATIC ADENOCARCINOMA. - SEE COMMENT. Microscopic Comment Immunohistochemical stains are performed. The tumor is positive for cytokeratin 20 and CDX-2. Cytokeratin 7 stains background bile ducts but is negative within the tumor. The tumor is negative for TTF-1 and estrogen receptor. The morphology coupled  with the staining pattern is consistent with metastatic adenocarcinoma of colorectal primary source.   Diagnosis 02/01/2015 Breast, right, needle core biopsy, LOQ - INVASIVE DUCTAL CARCINOMA, SEE COMMENT. - DUCTAL CARCINOMA IN SITU. Microscopic Comment Although the grade of tumor is best assessed at resection, with these biopsies, both the invasive and in situ carcinoma are grade I-II. Breast prognostic studies are pending and will be reported in an addendum. Both the invasive and in situ carcinoma demonstrate strongly diffuse E. cadherin expression; supporting a ductal phenotype. The absence of the myoepithelial layer was confirmed with smooth muscle myosin heavy chain, calponin, and p63 immunostains. The case was reviewed by Dr Avis Epley who concurs.  Results: IMMUNOHISTOCHEMICAL AND MORPHOMETRIC ANALYSIS BY THE AUTOMATED CELLULAR IMAGING SYSTEM (ACIS) Estrogen Receptor: 90%, POSITIVE, STRONG STAINING INTENSITY (PERFORMED MANUALLY) Progesterone Receptor: 10%, POSITIVE, STRONG STAINING INTENSITY (PERFORMED MANUALLY) Proliferation Marker Ki67: 10%  HER2 - NEGATIVE RATIO OF HER2/CEP17 SIGNALS 1.09 AVERAGE HER2 COPY NUMBER PER CELL 1.90  PET 11/10/2014  IMPRESSION: 1. Status post sigmoidectomy with two small areas of hypermetabolism in the right lobe of the liver, which are concerning for potential metastatic lesions, as above. This could be confirmed with MRI of the abdomen with and without IV gadolinium. 2. There is some relatively diffuse hypermetabolism in the rectum, which is favored to be either physiologic or related to a proctitis. No focal mass like soft tissue is noted adjacent to the suture line to suggest local recurrence of disease. 3. Small focus of ground-glass attenuation in the medial aspect of the right lower lobe, favored to reflect developing fibrosis related to adjacent large marginal osteophytes in the thoracic spine (reference: AJR Am J Roentgenol. 2002  Oct;179(4):893-6.). There is some low-level hypermetabolism in this region at this time, however, this finding is strongly favored to be benign. Attention at time of follow-up imaging is recommended to ensure the stability or resolution of these findings. 4. Dilatation of the pulmonic trunk (3.9 cm in diameter), suggesting pulmonary arterial hypertension.   ASSESSMENT: Veronica Reyes 59 y.o. female with a history of  1.Colon Cancer, Stage I (pT2N0), MMR normal, oligo recurrent disease in liver in 12/2014, KRAS mutation (+) --I discussed the recent abdominal MRI and PET scan  and personally reviewed the scan with her. There  are 2 small uptake in the liver, corresponding to a liver lesion with enhancement in segment 7 of MRI, this is highly suspicious for metastatic disease.  -I discussed the liver biopsy result with her in great details, which is consistent with metastatic colon cancer -I reviewed the nature history of metastatic colon cancer, and the potential curative approach with chemotherapy followed by liver lesion ablation or resection. However, more likely, this is not curable disease. -her tumor has K-ras mutation, so she will not benefit from EGFR targeted therapy -I discussed the option of first-line chemotherapy, including FOLFOX, CAPEOX, with or without Avastin. She does not want a port placement and opted CAPEOX. Capecitabine was approved by her insurance and she has received it.   -Her case was discussed in our GI tumor Board, and the recommendation is systemic chemotherapy followed by surgical resection for the liver lesion  - I reviewed her restaging CT scan from 2 weeks ago, interestingly, without any treatment,  the liver lesion got smaller and not well  Visualized in the CT scan. - she has agreed to start chemotherapy. I'll schedule her to start early next week with CAPEOX.  2. History of PE (01/06/2014) --She has a diagnosis of pulmonary embolism, likely secondary to  malignancy in the setting of family history for stroke (and possible stroke history in patient as well). Lower extremity dopplers negative.  -Given her recurrent metastatic colon cancer, I recommended her to start Xarelto.   3. New right breast cancer, T1aN0M0, stage Ia, ER+/PR+/HER2-, History of left Breast Cancer --Left CIS in situ, s/p lumpectomy in 2006 and 2011 by Dr. Margot Chimes, and radiation in 2011 by Dr. Sondra Come. Last mammogram on 01/09/14 negative - I discussed her right breast imaging finding and biopsy results.  Unfortunately she has early-stage right breast cancer now.  - I discussed the standard treatment option is lumpectomy plus sentinel lymph nodes for her breast cancer,  Followed by adjuvant radiation and endocrine therapy ( antiestrogen therapy).  - given her concurrent metastatic on cancer to liver, and her insurance issue ( [facility] is not able to see her due to her insurance),  Will hold on her breast surgery for now. She is applying for Medicaid,  We'll check with the surgical group to see if they can take her again when she has Medicaid.  If not , I will likely refer her to [facility]  For surgical consult.   4.  Uterine Fibroids  --Negative endometrial biopsy. Following with Gynecology.  6. Kidney lesion (Right). -No hypermetabolic right kidney lesion on the pet scan -Repeat abdominal MRI  7. Coping  -She was quite overwhelmed by the diagnosis of metastatic colon cancer.  -She is coping much better now, and she has good support from her husband  Plan  -Return to clinic early next week for first cycle CAPEOX  - follow-up in 10 days after her first cycle chemotherapy for toxicity check up  All questions were answered. The patient knows to call the clinic with any problems, questions or concerns. We can certainly see the patient much sooner if necessary.  I spent 25 minutes counseling the patient face to face. The total time spent in the appointment was 30  minutes.   [name]  03/01/2015

## 2015-03-02 LAB — CEA: CEA: 1.3 ng/mL (ref 0.0–5.0)

## 2015-03-05 ENCOUNTER — Ambulatory Visit (HOSPITAL_BASED_OUTPATIENT_CLINIC_OR_DEPARTMENT_OTHER): Payer: 59

## 2015-03-05 VITALS — BP 129/72 | HR 61 | Temp 99.0°F | Resp 18

## 2015-03-05 DIAGNOSIS — C787 Secondary malignant neoplasm of liver and intrahepatic bile duct: Secondary | ICD-10-CM | POA: Diagnosis not present

## 2015-03-05 DIAGNOSIS — Z5111 Encounter for antineoplastic chemotherapy: Secondary | ICD-10-CM

## 2015-03-05 DIAGNOSIS — C187 Malignant neoplasm of sigmoid colon: Secondary | ICD-10-CM

## 2015-03-05 MED ORDER — OXALIPLATIN CHEMO INJECTION 100 MG/20ML
130.0000 mg/m2 | Freq: Once | INTRAVENOUS | Status: AC
  Administered 2015-03-05: 315 mg via INTRAVENOUS
  Filled 2015-03-05: qty 63

## 2015-03-05 MED ORDER — SODIUM CHLORIDE 0.9 % IV SOLN
Freq: Once | INTRAVENOUS | Status: AC
  Administered 2015-03-05: 13:00:00 via INTRAVENOUS
  Filled 2015-03-05: qty 4

## 2015-03-05 MED ORDER — DEXTROSE 5 % IV SOLN
Freq: Once | INTRAVENOUS | Status: AC
  Administered 2015-03-05: 13:00:00 via INTRAVENOUS

## 2015-03-05 NOTE — Patient Instructions (Signed)
[facility] Discharge Instructions for Patients Receiving Chemotherapy  Today you received the following chemotherapy agents:  Oxaliplatin  To help prevent nausea and vomiting after your treatment, we encourage you to take your nausea medication as prescribed.   If you develop nausea and vomiting that is not controlled by your nausea medication, call the clinic.   BELOW ARE SYMPTOMS THAT SHOULD BE REPORTED IMMEDIATELY:  *FEVER GREATER THAN 100.5 F  *CHILLS WITH OR WITHOUT FEVER  NAUSEA AND VOMITING THAT IS NOT CONTROLLED WITH YOUR NAUSEA MEDICATION  *UNUSUAL SHORTNESS OF BREATH  *UNUSUAL BRUISING OR BLEEDING  TENDERNESS IN MOUTH AND THROAT WITH OR WITHOUT PRESENCE OF ULCERS  *URINARY PROBLEMS  *BOWEL PROBLEMS  UNUSUAL RASH Items with * indicate a potential emergency and should be followed up as soon as possible.  Feel free to call the clinic you have any questions or concerns. The clinic phone number is [phone].  Please show the CHEMO ALERT CARD at check-in to the Emergency Department and triage nurse.   

## 2015-03-05 NOTE — [facility] End of Treatment Summary (Signed)
Oncology Nurse Navigator Documentation  Oncology Nurse Navigator Flowsheets 03/05/2015  Navigator Encounter Type Treatment-Initial visit  Patient Visit Type Medonc  Treatment Phase First Chemo Tx-CAPOX  Barriers/Navigation Needs Education-diarrhea management,antiemetic regimen  Support Groups/Services Friends and Family  Time Spent with Patient 15  Met with patient during initial chemotherapy treatment to provide support and assess for needs to promote continuity of care. Merceda Elks, RN, BSN GI Oncology [place]

## 2015-03-12 ENCOUNTER — Telehealth: Payer: Self-pay | Admitting: *Deleted

## 2015-03-12 NOTE — Telephone Encounter (Signed)
-----   Message from [name], MD sent at 03/12/2015 12:58 PM EDT ----- Veronica Reyes, could you call pt and find out why she did not show up?  Thanks.  Krista Blue  ----- Message -----    From: Jeralyn Ruths, CMA    Sent: 03/12/2015   9:21 AM      To: [name], MD  Dr. Burr Medico,  Ms. Sabra Heck was a no show for her appt with Dr. [name] on 6/24.  She has cancelled or no showed for her previous appointments.  Just an FYI.  Thanks, Jeralyn Ruths, CMA for Dr. [name]

## 2015-03-12 NOTE — Telephone Encounter (Signed)
Left message on pt's identified vm to call back regarding missed appt today.

## 2015-03-13 ENCOUNTER — Other Ambulatory Visit: Payer: Self-pay | Admitting: Hematology

## 2015-03-13 DIAGNOSIS — C187 Malignant neoplasm of sigmoid colon: Secondary | ICD-10-CM

## 2015-03-14 ENCOUNTER — Other Ambulatory Visit (HOSPITAL_BASED_OUTPATIENT_CLINIC_OR_DEPARTMENT_OTHER): Payer: 59

## 2015-03-14 ENCOUNTER — Ambulatory Visit (HOSPITAL_BASED_OUTPATIENT_CLINIC_OR_DEPARTMENT_OTHER): Payer: 59 | Admitting: Nurse Practitioner

## 2015-03-14 ENCOUNTER — Telehealth: Payer: Self-pay | Admitting: Hematology

## 2015-03-14 ENCOUNTER — Telehealth: Payer: Self-pay | Admitting: *Deleted

## 2015-03-14 VITALS — BP 140/84 | HR 65 | Temp 98.6°F | Resp 20 | Ht 65.0 in | Wt 277.8 lb

## 2015-03-14 DIAGNOSIS — C187 Malignant neoplasm of sigmoid colon: Secondary | ICD-10-CM

## 2015-03-14 DIAGNOSIS — E876 Hypokalemia: Secondary | ICD-10-CM | POA: Diagnosis not present

## 2015-03-14 DIAGNOSIS — C787 Secondary malignant neoplasm of liver and intrahepatic bile duct: Secondary | ICD-10-CM

## 2015-03-14 LAB — CBC & DIFF AND RETIC
BASO%: 0.1 % (ref 0.0–2.0)
Basophils Absolute: 0 10*3/uL (ref 0.0–0.1)
EOS%: 1 % (ref 0.0–7.0)
Eosinophils Absolute: 0.1 10*3/uL (ref 0.0–0.5)
HCT: 40.5 % (ref 34.8–46.6)
HGB: 13.9 g/dL (ref 11.6–15.9)
Immature Retic Fract: 12 % — ABNORMAL HIGH (ref 1.60–10.00)
LYMPH%: 31.5 % (ref 14.0–49.7)
MCH: 32 pg (ref 25.1–34.0)
MCHC: 34.3 g/dL (ref 31.5–36.0)
MCV: 93.3 fL (ref 79.5–101.0)
MONO#: 0.5 10*3/uL (ref 0.1–0.9)
MONO%: 5.5 % (ref 0.0–14.0)
NEUT#: 5.6 10*3/uL (ref 1.5–6.5)
NEUT%: 61.9 % (ref 38.4–76.8)
Platelets: 261 10*3/uL (ref 145–400)
RBC: 4.34 10*6/uL (ref 3.70–5.45)
RDW: 13.2 % (ref 11.2–14.5)
RETIC %: 0.8 % (ref 0.70–2.10)
Retic Ct Abs: 34.72 10*3/uL (ref 33.70–90.70)
WBC: 9 10*3/uL (ref 3.9–10.3)
lymph#: 2.8 10*3/uL (ref 0.9–3.3)

## 2015-03-14 LAB — COMPREHENSIVE METABOLIC PANEL (CC13)
ALT: 46 U/L (ref 0–55)
AST: 50 U/L — ABNORMAL HIGH (ref 5–34)
Albumin: 3.2 g/dL — ABNORMAL LOW (ref 3.5–5.0)
Alkaline Phosphatase: 47 U/L (ref 40–150)
Anion Gap: 9 mEq/L (ref 3–11)
BUN: 8.5 mg/dL (ref 7.0–26.0)
CHLORIDE: 101 meq/L (ref 98–109)
CO2: 30 meq/L — AB (ref 22–29)
CREATININE: 0.9 mg/dL (ref 0.6–1.1)
Calcium: 9.4 mg/dL (ref 8.4–10.4)
EGFR: 88 mL/min/{1.73_m2} — ABNORMAL LOW (ref >90)
Glucose: 113 mg/dl (ref 70–140)
Potassium: 3 mEq/L — CL (ref 3.5–5.1)
Sodium: 139 mEq/L (ref 136–145)
TOTAL PROTEIN: 7.3 g/dL (ref 6.4–8.3)
Total Bilirubin: 0.59 mg/dL (ref 0.20–1.20)

## 2015-03-14 MED ORDER — POTASSIUM CHLORIDE CRYS ER 20 MEQ PO TBCR: 20.0000 meq | EXTENDED_RELEASE_TABLET | Freq: Two times a day (BID) | ORAL | Status: DC

## 2015-03-14 NOTE — Progress Notes (Signed)
[facility] OFFICE PROGRESS NOTE   Diagnosis:  Colon cancer, breast cancer PROBLEM LIST 1. Left breast DCIS diagnosed in 2006 and 2010, status post lumpectomy. 2. pT2N0M0 stage I colon cancer, s/p partial colectomy on 03/09/2014  3. PE in 12/2013 when she was diagnosed with colon cancer.  Oncology History   Cancer of sigmoid colon  Staging form: Colon and Rectum, AJCC 7th Edition  Clinical: Stage I (T2, N0, M0) - Unsigned       Breast cancer   2006 Cancer Diagnosis Left breast DCIS diagnosed in 2006 and 2010, status post lumpectomy.   01/07/2014 Initial Diagnosis Breast cancer   01/18/2015 Mammogram 52m mass in lower outer quadrant of right breast    02/01/2015 Initial Biopsy right breast mass biopsy showed invasive ductal carcinoma    02/01/2015 Receptors her2 ER 90%+, PR 10%+, kI67 10%, HER2 negative     Cancer of sigmoid colon   03/04/2014 Initial Diagnosis Cancer of sigmoid colon   03/09/2014 Pathology Results G1 invasive adenocarcinoma, no lymph-Vascular invasion or Peri-neural invasion: Absent. MMR normal, MSI stable.    03/09/2014 Surgery partial colectomy, negative margins.    11/10/2014 Relapse/Recurrence biopsy confirmed oligo liver recurrence    11/10/2014 Imaging PET showed two small ares of hypermetabolism in the right lobe of the liver, no other distant metastasis.    11/30/2014 Imaging abdomen MRI showed a new enhancing lesion which corresponds to an area of abnormal hyper metabolism on recent PET-CT. Stable enlarged portal caval lymph node.    01/02/2015 Pathology Results Liver biopsy showed metastatic adenocarcinoma, IHC (+) for CK20, CDX-2, (-) for TTF-1 and ER   01/02/2015 Miscellaneous liver biopsy KRAS mutation (+)          INTERVAL HISTORY:   Ms. Veronica Heckreturns as scheduled. She completed cycle 1 CAPOX beginning 03/05/2015. She had 2 episodes of nausea with vomiting. No mouth sores. No  diarrhea. No hand or foot pain or redness. Cold sensitivity lasted about 6 days. No persistent neuropathy symptoms. He denies pain.  Objective:  Vital signs in last 24 hours:  Blood pressure 140/84, pulse 65, temperature 98.6 F (37 C), temperature source Oral, resp. rate 20, height '5\' 5"'  (1.651 m), weight 277 lb 12.8 oz (126.009 kg), last menstrual period 01/23/2014, SpO2 100 %.    HEENT: No thrush or ulcers. Resp: Lungs clear bilaterally. Cardio: Regular rate and rhythm. GI: Abdomen soft and nontender. No hepatomegaly. Vascular: No leg edema. Calves soft and nontender.  Skin: No rash.    Lab Results:  Lab Results  Component Value Date   WBC 9.0 03/14/2015   HGB 13.9 03/14/2015   HCT 40.5 03/14/2015   MCV 93.3 03/14/2015   PLT 261 03/14/2015   NEUTROABS 5.6 03/14/2015    Imaging:  No results found.  Medications: I have reviewed the patient's current medications.  Assessment/Plan: 1. Colon Cancer, Stage I (pT2N0) diagnosed 02/2014, MMR normal, oligo recurrent disease in liver in 12/2014, KRAS mutation (+). Cycle 1 CAPOX beginning 03/05/2015. 2. History of PE 01/06/2014. On Xarelto. 3. New right breast cancer, T1aN0M0, stage Ia, ER+/PR+/HER2-; History of left breast cancer 4. Uterine fibroids. Negative endometrial biopsy. Followed by gynecology. 5. Right kidney lesion. No hypermetabolic right kidney lesion on the PET scan. Plan for repeat abdominal MRI.   Disposition: Ms. Veronica Heckappears stable. She is completing cycle 1 CAPOX. Thus far she appears to be tolerating treatment well. She will return for a follow-up visit and cycle 2 CAPOX on 03/26/2015. She will  contact the office in the interim with any problems.    Ned Card ANP/GNP-BC   03/14/2015  2:10 PM

## 2015-03-14 NOTE — Telephone Encounter (Signed)
Received call from lab that pt's K+ was 5.0.  Called pt & she had been taking K+ once daily.  She will take bid & Ned Card NP sent in new script.

## 2015-03-14 NOTE — Telephone Encounter (Signed)
Gave and printed appt sched and avs fo rpt for July and AUg °

## 2015-03-26 ENCOUNTER — Telehealth: Payer: Self-pay | Admitting: *Deleted

## 2015-03-26 ENCOUNTER — Ambulatory Visit (HOSPITAL_BASED_OUTPATIENT_CLINIC_OR_DEPARTMENT_OTHER): Payer: 59 | Admitting: Hematology

## 2015-03-26 ENCOUNTER — Other Ambulatory Visit (HOSPITAL_BASED_OUTPATIENT_CLINIC_OR_DEPARTMENT_OTHER): Payer: 59

## 2015-03-26 ENCOUNTER — Encounter: Payer: Self-pay | Admitting: Hematology

## 2015-03-26 ENCOUNTER — Ambulatory Visit (HOSPITAL_BASED_OUTPATIENT_CLINIC_OR_DEPARTMENT_OTHER): Payer: 59

## 2015-03-26 VITALS — BP 150/80 | HR 61 | Temp 98.4°F | Resp 18 | Ht 65.0 in | Wt 280.7 lb

## 2015-03-26 DIAGNOSIS — C187 Malignant neoplasm of sigmoid colon: Secondary | ICD-10-CM

## 2015-03-26 DIAGNOSIS — C787 Secondary malignant neoplasm of liver and intrahepatic bile duct: Secondary | ICD-10-CM

## 2015-03-26 DIAGNOSIS — D259 Leiomyoma of uterus, unspecified: Secondary | ICD-10-CM

## 2015-03-26 DIAGNOSIS — C50911 Malignant neoplasm of unspecified site of right female breast: Secondary | ICD-10-CM

## 2015-03-26 DIAGNOSIS — Z5111 Encounter for antineoplastic chemotherapy: Secondary | ICD-10-CM | POA: Diagnosis not present

## 2015-03-26 LAB — COMPREHENSIVE METABOLIC PANEL (CC13)
ALBUMIN: 3.1 g/dL — AB (ref 3.5–5.0)
ALT: 38 U/L (ref 0–55)
AST: 47 U/L — AB (ref 5–34)
Alkaline Phosphatase: 46 U/L (ref 40–150)
Anion Gap: 9 mEq/L (ref 3–11)
BUN: 10.9 mg/dL (ref 7.0–26.0)
CHLORIDE: 108 meq/L (ref 98–109)
CO2: 24 mEq/L (ref 22–29)
Calcium: 9.9 mg/dL (ref 8.4–10.4)
Creatinine: 0.9 mg/dL (ref 0.6–1.1)
EGFR: 84 mL/min/{1.73_m2} — ABNORMAL LOW (ref >90)
GLUCOSE: 121 mg/dL (ref 70–140)
Potassium: 3.3 mEq/L — ABNORMAL LOW (ref 3.5–5.1)
Sodium: 141 mEq/L (ref 136–145)
Total Bilirubin: 0.54 mg/dL (ref 0.20–1.20)
Total Protein: 7.4 g/dL (ref 6.4–8.3)

## 2015-03-26 LAB — CBC WITH DIFFERENTIAL/PLATELET
BASO%: 0.3 % (ref 0.0–2.0)
Basophils Absolute: 0 10*3/uL (ref 0.0–0.1)
EOS ABS: 0.1 10*3/uL (ref 0.0–0.5)
EOS%: 0.8 % (ref 0.0–7.0)
HEMATOCRIT: 41.5 % (ref 34.8–46.6)
HGB: 14.2 g/dL (ref 11.6–15.9)
LYMPH%: 38.7 % (ref 14.0–49.7)
MCH: 32.8 pg (ref 25.1–34.0)
MCHC: 34.3 g/dL (ref 31.5–36.0)
MCV: 95.6 fL (ref 79.5–101.0)
MONO#: 0.5 10*3/uL (ref 0.1–0.9)
MONO%: 6.8 % (ref 0.0–14.0)
NEUT#: 3.7 10*3/uL (ref 1.5–6.5)
NEUT%: 53.4 % (ref 38.4–76.8)
Platelets: 246 10*3/uL (ref 145–400)
RBC: 4.34 10*6/uL (ref 3.70–5.45)
RDW: 14.8 % — ABNORMAL HIGH (ref 11.2–14.5)
WBC: 7 10*3/uL (ref 3.9–10.3)
lymph#: 2.7 10*3/uL (ref 0.9–3.3)

## 2015-03-26 MED ORDER — OXALIPLATIN CHEMO INJECTION 100 MG/20ML
130.0000 mg/m2 | Freq: Once | INTRAVENOUS | Status: AC
  Administered 2015-03-26: 315 mg via INTRAVENOUS
  Filled 2015-03-26: qty 63

## 2015-03-26 MED ORDER — DEXTROSE 5 % IV SOLN
Freq: Once | INTRAVENOUS | Status: AC
  Administered 2015-03-26: 12:00:00 via INTRAVENOUS

## 2015-03-26 MED ORDER — SODIUM CHLORIDE 0.9 % IV SOLN
Freq: Once | INTRAVENOUS | Status: AC
  Administered 2015-03-26: 12:00:00 via INTRAVENOUS
  Filled 2015-03-26: qty 4

## 2015-03-26 MED ORDER — ONDANSETRON HCL 8 MG PO TABS: 8.0000 mg | ORAL_TABLET | Freq: Two times a day (BID) | ORAL | Status: DC

## 2015-03-26 NOTE — Telephone Encounter (Signed)
Received fax 03/22/15 from [facility] stating that xeloda script has been shipped to pt via [employer] for expected delivery 03/22/15.  Tracking # [id number]

## 2015-03-26 NOTE — Patient Instructions (Addendum)
[facility] Discharge Instructions for Patients Receiving Chemotherapy  Today you received the following chemotherapy agents oxaliplatin   To help prevent nausea and vomiting after your treatment, we encourage you to take your nausea medication as directed   If you develop nausea and vomiting that is not controlled by your nausea medication, call the clinic.   BELOW ARE SYMPTOMS THAT SHOULD BE REPORTED IMMEDIATELY:  *FEVER GREATER THAN 100.5 F  *CHILLS WITH OR WITHOUT FEVER  NAUSEA AND VOMITING THAT IS NOT CONTROLLED WITH YOUR NAUSEA MEDICATION  *UNUSUAL SHORTNESS OF BREATH  *UNUSUAL BRUISING OR BLEEDING  TENDERNESS IN MOUTH AND THROAT WITH OR WITHOUT PRESENCE OF ULCERS  *URINARY PROBLEMS  *BOWEL PROBLEMS  UNUSUAL RASH Items with * indicate a potential emergency and should be followed up as soon as possible.  Feel free to call the clinic you have any questions or concerns. The clinic phone number is [phone]. Oxaliplatin Injection What is this medicine? OXALIPLATIN (ox AL i PLA tin) is a chemotherapy drug. It targets fast dividing cells, like cancer cells, and causes these cells to die. This medicine is used to treat cancers of the colon and rectum, and many other cancers. This medicine may be used for other purposes; ask your health care provider or pharmacist if you have questions. COMMON BRAND NAME(S): Eloxatin What should I tell my health care provider before I take this medicine? They need to know if you have any of these conditions: -kidney disease -an unusual or allergic reaction to oxaliplatin, other chemotherapy, other medicines, foods, dyes, or preservatives -pregnant or trying to get pregnant -breast-feeding How should I use this medicine? This drug is given as an infusion into a vein. It is administered in a hospital or clinic by a specially trained health care professional. Talk to your pediatrician regarding the use of this medicine in  children. Special care may be needed. Overdosage: If you think you have taken too much of this medicine contact a poison control center or emergency room at once. NOTE: This medicine is only for you. Do not share this medicine with others. What if I miss a dose? It is important not to miss a dose. Call your doctor or health care professional if you are unable to keep an appointment. What may interact with this medicine? -medicines to increase blood counts like filgrastim, pegfilgrastim, sargramostim -probenecid -some antibiotics like amikacin, gentamicin, neomycin, polymyxin B, streptomycin, tobramycin -zalcitabine Talk to your doctor or health care professional before taking any of these medicines: -acetaminophen -aspirin -ibuprofen -ketoprofen -naproxen This list may not describe all possible interactions. Give your health care provider a list of all the medicines, herbs, non-prescription drugs, or dietary supplements you use. Also tell them if you smoke, drink alcohol, or use illegal drugs. Some items may interact with your medicine. What should I watch for while using this medicine? Your condition will be monitored carefully while you are receiving this medicine. You will need important blood work done while you are taking this medicine. This medicine can make you more sensitive to cold. Do not drink cold drinks or use ice. Cover exposed skin before coming in contact with cold temperatures or cold objects. When out in cold weather wear warm clothing and cover your mouth and nose to warm the air that goes into your lungs. Tell your doctor if you get sensitive to the cold. This drug may make you feel generally unwell. This is not uncommon, as chemotherapy can affect healthy cells as well  as cancer cells. Report any side effects. Continue your course of treatment even though you feel ill unless your doctor tells you to stop. In some cases, you may be given additional medicines to help with side  effects. Follow all directions for their use. Call your doctor or health care professional for advice if you get a fever, chills or sore throat, or other symptoms of a cold or flu. Do not treat yourself. This drug decreases your body's ability to fight infections. Try to avoid being around people who are sick. This medicine may increase your risk to bruise or bleed. Call your doctor or health care professional if you notice any unusual bleeding. Be careful brushing and flossing your teeth or using a toothpick because you may get an infection or bleed more easily. If you have any dental work done, tell your dentist you are receiving this medicine. Avoid taking products that contain aspirin, acetaminophen, ibuprofen, naproxen, or ketoprofen unless instructed by your doctor. These medicines may hide a fever. Do not become pregnant while taking this medicine. Women should inform their doctor if they wish to become pregnant or think they might be pregnant. There is a potential for serious side effects to an unborn child. Talk to your health care professional or pharmacist for more information. Do not breast-feed an infant while taking this medicine. Call your doctor or health care professional if you get diarrhea. Do not treat yourself. What side effects may I notice from receiving this medicine? Side effects that you should report to your doctor or health care professional as soon as possible: -allergic reactions like skin rash, itching or hives, swelling of the face, lips, or tongue -low blood counts - This drug may decrease the number of white blood cells, red blood cells and platelets. You may be at increased risk for infections and bleeding. -signs of infection - fever or chills, cough, sore throat, pain or difficulty passing urine -signs of decreased platelets or bleeding - bruising, pinpoint red spots on the skin, black, tarry stools, nosebleeds -signs of decreased red blood cells - unusually weak or  tired, fainting spells, lightheadedness -breathing problems -chest pain, pressure -cough -diarrhea -jaw tightness -mouth sores -nausea and vomiting -pain, swelling, redness or irritation at the injection site -pain, tingling, numbness in the hands or feet -problems with balance, talking, walking -redness, blistering, peeling or loosening of the skin, including inside the mouth -trouble passing urine or change in the amount of urine Side effects that usually do not require medical attention (report to your doctor or health care professional if they continue or are bothersome): -changes in vision -constipation -hair loss -loss of appetite -metallic taste in the mouth or changes in taste -stomach pain This list may not describe all possible side effects. Call your doctor for medical advice about side effects. You may report side effects to FDA at 1-800-FDA-1088. Where should I keep my medicine? This drug is given in a hospital or clinic and will not be stored at home. NOTE: This sheet is a summary. It may not cover all possible information. If you have questions about this medicine, talk to your doctor, pharmacist, or health care provider.  2015, Elsevier/Gold Standard. (2008-03-28 17:22:47)   i enjoyed taking care of you today, i hope you have a nice day. [place] anytime with questions or concerns. [phone]

## 2015-03-26 NOTE — Progress Notes (Signed)
[facility] PROGRESS NOTE DATE OF VISIT: 06/27/2014  [name]., FNP [name] [place] [id number]  DIAGNOSIS: recurrent Cancer of sigmoid colon - Plan: ondansetron (ZOFRAN) 8 MG tablet  Breast cancer, right  PROBLEM LIST 1. Left breast DCIS diagnosed in 2006 and 2010, status post lumpectomy. 2. pT2N0M0 stage I colon cancer, s/p partial colectomy on 03/09/2014  3. PE in 12/2013 when she was diagnosed with colon cancer.  Oncology History   Cancer of sigmoid colon   Staging form: Colon and Rectum, AJCC 7th Edition     Clinical: Stage I (T2, N0, M0) - Unsigned       Breast cancer   2006 Cancer Diagnosis Left breast DCIS diagnosed in 2006 and 2010, status post lumpectomy.   01/07/2014 Initial Diagnosis Breast cancer   01/18/2015 Mammogram 63m mass in lower outer quadrant of right breast    02/01/2015 Initial Biopsy right breast mass biopsy showed invasive ductal carcinoma    02/01/2015 Receptors her2 ER 90%+, PR 10%+, kI67 10%, HER2 pending     Cancer of sigmoid colon   03/04/2014 Initial Diagnosis Cancer of sigmoid colon   03/09/2014 Pathology Results G1 invasive adenocarcinoma, no lymph-Vascular invasion or Peri-neural invasion: Absent. MMR normal, MSI stable.    03/09/2014 Surgery partial colectomy, negative margins.    11/10/2014 Relapse/Recurrence biopsy confirmed oligo liver recurrence    11/10/2014 Imaging PET showed two small ares of hypermetabolism in the right lobe of the liver, no other distant metastasis.    11/30/2014 Imaging abdomen MRI showed a new enhancing lesion which corresponds to an area of abnormal hyper metabolism on recent PET-CT. Stable enlarged portal caval lymph node.    01/02/2015 Pathology Results Liver biopsy showed metastatic adenocarcinoma, IHC (+) for CK20, CDX-2, (-) for TTF-1 and ER   01/02/2015 Miscellaneous liver biopsy KRAS mutation (+)      CURRENT TREATMENT:  CAPEOX (capecitabine 2500 mg twice  daily Day 1-14, oxaliplatin 1 30 mg/m on day 1, every 21 days, started on 03/05/2015  INTERVAL HISTORY:  Veronica ROSELLI89y.o. female with a history of breast cancer, colon cancer s/p laparoscopic-assisted partial colectomy (03/09/2014), PE (01/06/2014) , presents to clinic for follow up and second cycle chemo.   She tolerated the first cycle chemotherapy regimen very well. She had 2 episodes of nausea and vomiting, resolved after taking Zofran. Otherwise no significant other side effects. She has good appetite and eating well, energy level is fairly well also. She lost some weight after the first cycle chemotherapy and gained 4 pounds back last week. She takes Hamp oil, which is a substrate from marijuana, which helps her appetite and energy level.  MEDICAL HISTORY: Past Medical History  Diagnosis Date  . Hypertension   . Shortness of breath     sob with exertion. dx. with Pulmonary emboli 4'15 ,tx. Xarelto,Lovenox used for [employer]  . Pulmonary embolism     "blood clot in lungs" -dx. 4'15 with CT Chest.  . Liver lesion   . Cancer     left breast cancer x2  . Breast CA     radiation and surgery-lt.  . Colon cancer     ALLERGIES:  is allergic to tramadol.  MEDICATIONS:    Medication List       This list is accurate as of: 03/26/15 12:04 PM.  Always use your most recent med list.  amLODipine 2.5 MG tablet  Commonly known as:  NORVASC  Take 2.5 mg by mouth daily.     capecitabine 500 MG tablet  Commonly known as:  XELODA  Take 5 tablets (2,500 mg total) by mouth 2 (two) times daily after a meal. Take on days 1-14 of chemotherapy.     ferrous sulfate 325 (65 FE) MG tablet  Take 325 mg by mouth every morning.     hydrochlorothiazide 25 MG tablet  Commonly known as:  HYDRODIURIL  Take 25 mg by mouth every morning.     metoprolol 50 MG tablet  Commonly known as:  LOPRESSOR  Take 50 mg by mouth 2 (two) times daily.     ondansetron 8 MG tablet   Commonly known as:  ZOFRAN  Take 1 tablet (8 mg total) by mouth 2 (two) times daily. Start the day after chemo for 2 days. Then take as needed for nausea or vomiting.     potassium chloride SA 20 MEQ tablet  Commonly known as:  K-DUR,KLOR-CON  Take 1 tablet (20 mEq total) by mouth 2 (two) times daily. Or as directed.     XARELTO 20 MG Tabs tablet  Generic drug:  rivaroxaban  TAKE ONE TABLET BY MOUTH EVERY DAY WITH SUPPER        SURGICAL HISTORY:  Past Surgical History  Procedure Laterality Date  . Cesarean section    . Breast surgery      '11- Left breast lumpectomy  . Cholecystectomy      Laparoscopic 20 yrs ago  . Partial colectomy N/A 03/09/2014    Procedure: LAPAROSCOPIC ASSISTED PARTIAL COLECTOMY, splenic flexure mobilization;  Surgeon: Leighton Ruff, MD;  Location: [facility];  Service: General;  Laterality: N/A;    REVIEW OF SYSTEMS:   Constitutional: Denies fevers, chills or abnormal weight loss Eyes: Denies blurriness of vision Ears, nose, mouth, throat, and face: Denies mucositis or sore throat Respiratory: Denies cough, dyspnea or wheezes Cardiovascular: Denies palpitation, chest discomfort or lower extremity swelling Gastrointestinal:  Denies nausea, heartburn or change in bowel habits Skin: Denies abnormal skin rashes Lymphatics: Denies new lymphadenopathy or easy bruising Neurological:Denies numbness, tingling or new weaknesses Behavioral/Psych: Mood is stable, no new changes  All other systems were reviewed with the patient and are negative. Breasts: Breast inspection showed them to be symmetrical with no nipple discharge. Palpation of the breasts and axilla revealed no obvious mass that I could appreciate.   Old surgical scar in the left breast. (+)  Skin erythema below both breasts   PHYSICAL EXAMINATION: ECOG PERFORMANCE STATUS: 0  Blood pressure 150/80, pulse 61, temperature 98.4 F (36.9 C), temperature source Oral, resp. rate 18, height _0  (1.651 m),  weight 280 lb 11.2 oz (127.325 kg), last menstrual period 01/23/2014, SpO2 99 %.  GENERAL:alert, no distress and comfortable; well developed and obese. Easily mobile to exam table.  SKIN: skin color, texture, turgor are normal, no rashes or significant lesions EYES: normal, Conjunctiva are pink and non-injected, sclera clear OROPHARYNX:no exudate, no erythema and lips, buccal mucosa, and tongue normal  NECK: supple, thyroid normal size, non-tender, without nodularity LYMPH:  no palpable lymphadenopathy in the cervical, axillary or supraclavicular LUNGS: clear to auscultation with normal breathing effort, no wheezes or rhonchi HEART: regular rate & rhythm and no murmurs and no lower extremity edema ABDOMEN:abdomen soft, non-tender and normal bowel sounds; incision healing with signs of infection. Rectal exam negative for palpable lesion, no blood on glove  Musculoskeletal:no cyanosis of digits  and no clubbing  NEURO: alert & oriented x 3 with fluent speech, no focal motor/sensory deficits  Labs:  CBC Latest Ref Rng 03/26/2015 03/14/2015 03/01/2015  WBC 3.9 - 10.3 10e3/uL 7.0 9.0 8.5  Hemoglobin 11.6 - 15.9 g/dL 14.2 13.9 14.5  Hematocrit 34.8 - 46.6 % 41.5 40.5 42.6  Platelets 145 - 400 10e3/uL 246 261 223    CMP Latest Ref Rng 03/26/2015 03/14/2015 03/01/2015  Glucose 70 - 140 mg/dl 121 113 94  BUN 7.0 - 26.0 mg/dL 10.9 8.5 9.0  Creatinine 0.6 - 1.1 mg/dL 0.9 0.9 0.9  Sodium 136 - 145 mEq/L 141 139 140  Potassium 3.5 - 5.1 mEq/L 3.3(L) 3.0(LL) 3.3(L)  Chloride 96 - 112 mEq/L - - -  CO2 22 - 29 mEq/L 24 30(H) 30(H)  Calcium 8.4 - 10.4 mg/dL 9.9 9.4 9.3  Total Protein 6.4 - 8.3 g/dL 7.4 7.3 7.5  Total Bilirubin 0.20 - 1.20 mg/dL 0.54 0.59 0.68  Alkaline Phos 40 - 150 U/L 46 47 45  AST 5 - 34 U/L 47(H) 50(H) 48(H)  ALT 0 - 55 U/L 38 46 41    PATHOLOGY RESULT Diagnosis 01/02/2015 Liver, needle/core biopsy, right - POSITIVE FOR METASTATIC ADENOCARCINOMA. - SEE COMMENT. Microscopic  Comment Immunohistochemical stains are performed. The tumor is positive for cytokeratin 20 and CDX-2. Cytokeratin 7 stains background bile ducts but is negative within the tumor. The tumor is negative for TTF-1 and estrogen receptor. The morphology coupled with the staining pattern is consistent with metastatic adenocarcinoma of colorectal primary source.   Diagnosis 02/01/2015 Breast, right, needle core biopsy, LOQ - INVASIVE DUCTAL CARCINOMA, SEE COMMENT. - DUCTAL CARCINOMA IN SITU. Microscopic Comment Although the grade of tumor is best assessed at resection, with these biopsies, both the invasive and in situ carcinoma are grade I-II. Breast prognostic studies are pending and will be reported in an addendum. Both the invasive and in situ carcinoma demonstrate strongly diffuse E. cadherin expression; supporting a ductal phenotype. The absence of the myoepithelial layer was confirmed with smooth muscle myosin heavy chain, calponin, and p63 immunostains. The case was reviewed by Dr Avis Epley who concurs.  Results: IMMUNOHISTOCHEMICAL AND MORPHOMETRIC ANALYSIS BY THE AUTOMATED CELLULAR IMAGING SYSTEM (ACIS) Estrogen Receptor: 90%, POSITIVE, STRONG STAINING INTENSITY (PERFORMED MANUALLY) Progesterone Receptor: 10%, POSITIVE, STRONG STAINING INTENSITY (PERFORMED MANUALLY) Proliferation Marker Ki67: 10%  HER2 - NEGATIVE RATIO OF HER2/CEP17 SIGNALS 1.09 AVERAGE HER2 COPY NUMBER PER CELL 1.90  PET 11/10/2014  IMPRESSION: 1. Status post sigmoidectomy with two small areas of hypermetabolism in the right lobe of the liver, which are concerning for potential metastatic lesions, as above. This could be confirmed with MRI of the abdomen with and without IV gadolinium. 2. There is some relatively diffuse hypermetabolism in the rectum, which is favored to be either physiologic or related to a proctitis. No focal mass like soft tissue is noted adjacent to the suture line to suggest local recurrence  of disease. 3. Small focus of ground-glass attenuation in the medial aspect of the right lower lobe, favored to reflect developing fibrosis related to adjacent large marginal osteophytes in the thoracic spine (reference: AJR Am J Roentgenol. 2002 Oct;179(4):893-6.). There is some low-level hypermetabolism in this region at this time, however, this finding is strongly favored to be benign. Attention at time of follow-up imaging is recommended to ensure the stability or resolution of these findings. 4. Dilatation of the pulmonic trunk (3.9 cm in diameter), suggesting pulmonary arterial hypertension.   ASSESSMENT: Veronica Reyes 59  y.o. female with a history of  1.Colon Cancer, Stage I (pT2N0), MMR normal, oligo recurrent disease in liver in 12/2014, KRAS mutation (+) --I discussed the recent abdominal MRI and PET scan  and personally reviewed the scan with her. There are 2 small uptake in the liver, corresponding to a liver lesion with enhancement in segment 7 of MRI, this is highly suspicious for metastatic disease.  -I discussed the liver biopsy result with her in great details, which is consistent with metastatic colon cancer -I reviewed the nature history of metastatic colon cancer, and the potential curative approach with chemotherapy followed by liver lesion ablation or resection. However, more likely, this is not curable disease. -her tumor has K-ras mutation, so she will not benefit from EGFR targeted therapy -I discussed the option of first-line chemotherapy, including FOLFOX, CAPEOX, with or without Avastin. She does not want a port placement and opted CAPEOX. Capecitabine was approved by her insurance and she has received it.   -Her case was discussed in our GI tumor Board, and the recommendation is systemic chemotherapy followed by surgical resection for the liver lesion  - I reviewed her restaging CT scan from 02/14/2015, interestingly, without any treatment,  the liver lesion got  smaller and not well  Visualized in the CT scan. -She tolerated the first cycle CAPEOX well. Lab reviewed, adequate for treatment, we'll proceed with cycle 2 today.  2. History of PE (01/06/2014) --She has a diagnosis of pulmonary embolism, likely secondary to malignancy in the setting of family history for stroke (and possible stroke history in patient as well). Lower extremity dopplers negative.  -Given her recurrent metastatic colon cancer, I recommended her to start Xarelto.   3. New right breast cancer, T1aN0M0, stage Ia, ER+/PR+/HER2-, History of left Breast Cancer --Left CIS in situ, s/p lumpectomy in 2006 and 2011 by Dr. Margot Chimes, and radiation in 2011 by Dr. Sondra Come. Last mammogram on 01/09/14 negative - I discussed her right breast imaging finding and biopsy results.  Unfortunately she has early-stage right breast cancer now.  - I discussed the standard treatment option is lumpectomy plus sentinel lymph nodes for her breast cancer,  Followed by adjuvant radiation and endocrine therapy ( antiestrogen therapy).  -She's she will see Dr. [name] for the surgery   4.  Uterine Fibroids  --Negative endometrial biopsy. Following with Gynecology.  6. Kidney lesion (Right). -No hypermetabolic right kidney lesion on the pet scan -Repeat abdominal MRI  7. Coping  -She was quite overwhelmed by the diagnosis of metastatic colon cancer.  -She is coping much better now, and she has good support from her husband  Plan  -Cycle 2 chemo today  -Return to clinic in 3 weeks for cycle 3 CAPEOX    All questions were answered. The patient knows to call the clinic with any problems, questions or concerns. We can certainly see the patient much sooner if necessary.  I spent 25 minutes counseling the patient face to face. The total time spent in the appointment was 30 minutes.   [name]  03/26/2015

## 2015-04-02 ENCOUNTER — Encounter: Payer: Self-pay | Admitting: Genetic Counselor

## 2015-04-10 ENCOUNTER — Other Ambulatory Visit: Payer: Self-pay | Admitting: *Deleted

## 2015-04-10 ENCOUNTER — Telehealth: Payer: Self-pay | Admitting: *Deleted

## 2015-04-10 DIAGNOSIS — C187 Malignant neoplasm of sigmoid colon: Secondary | ICD-10-CM

## 2015-04-10 MED ORDER — CAPECITABINE 500 MG PO TABS: 2500.0000 mg | ORAL_TABLET | Freq: Two times a day (BID) | ORAL | Status: DC

## 2015-04-10 NOTE — Telephone Encounter (Signed)
[facility] called requesting a Xeloda refill. Fax: [phone]  Call in: [phone]. Ext: [phone]. Message sent to RN Thu.

## 2015-04-16 ENCOUNTER — Telehealth: Payer: Self-pay | Admitting: Hematology

## 2015-04-16 ENCOUNTER — Ambulatory Visit (HOSPITAL_BASED_OUTPATIENT_CLINIC_OR_DEPARTMENT_OTHER): Payer: 59

## 2015-04-16 ENCOUNTER — Ambulatory Visit (HOSPITAL_BASED_OUTPATIENT_CLINIC_OR_DEPARTMENT_OTHER): Payer: 59 | Admitting: Hematology

## 2015-04-16 ENCOUNTER — Encounter: Payer: Self-pay | Admitting: Hematology

## 2015-04-16 ENCOUNTER — Other Ambulatory Visit (HOSPITAL_BASED_OUTPATIENT_CLINIC_OR_DEPARTMENT_OTHER): Payer: 59

## 2015-04-16 ENCOUNTER — Encounter: Payer: Self-pay | Admitting: *Deleted

## 2015-04-16 VITALS — BP 138/76 | HR 63 | Temp 98.7°F | Resp 18 | Ht 65.0 in | Wt 281.3 lb

## 2015-04-16 DIAGNOSIS — Z5111 Encounter for antineoplastic chemotherapy: Secondary | ICD-10-CM | POA: Diagnosis not present

## 2015-04-16 DIAGNOSIS — C187 Malignant neoplasm of sigmoid colon: Secondary | ICD-10-CM

## 2015-04-16 DIAGNOSIS — C787 Secondary malignant neoplasm of liver and intrahepatic bile duct: Secondary | ICD-10-CM | POA: Diagnosis not present

## 2015-04-16 DIAGNOSIS — C50511 Malignant neoplasm of lower-outer quadrant of right female breast: Secondary | ICD-10-CM | POA: Diagnosis not present

## 2015-04-16 DIAGNOSIS — N289 Disorder of kidney and ureter, unspecified: Secondary | ICD-10-CM

## 2015-04-16 DIAGNOSIS — Z86711 Personal history of pulmonary embolism: Secondary | ICD-10-CM

## 2015-04-16 DIAGNOSIS — C50911 Malignant neoplasm of unspecified site of right female breast: Secondary | ICD-10-CM

## 2015-04-16 LAB — CBC & DIFF AND RETIC
BASO%: 0.2 % (ref 0.0–2.0)
Basophils Absolute: 0 10*3/uL (ref 0.0–0.1)
EOS%: 0.8 % (ref 0.0–7.0)
Eosinophils Absolute: 0.1 10*3/uL (ref 0.0–0.5)
HCT: 39.9 % (ref 34.8–46.6)
HGB: 13.5 g/dL (ref 11.6–15.9)
Immature Retic Fract: 12 % — ABNORMAL HIGH (ref 1.60–10.00)
LYMPH%: 45.6 % (ref 14.0–49.7)
MCH: 33.7 pg (ref 25.1–34.0)
MCHC: 33.8 g/dL (ref 31.5–36.0)
MCV: 99.5 fL (ref 79.5–101.0)
MONO#: 0.5 10*3/uL (ref 0.1–0.9)
MONO%: 7.2 % (ref 0.0–14.0)
NEUT#: 3 10*3/uL (ref 1.5–6.5)
NEUT%: 46.2 % (ref 38.4–76.8)
Platelets: 180 10*3/uL (ref 145–400)
RBC: 4.01 10*6/uL (ref 3.70–5.45)
RDW: 20.3 % — AB (ref 11.2–14.5)
Retic %: 5.09 % — ABNORMAL HIGH (ref 0.70–2.10)
Retic Ct Abs: 204.11 10*3/uL — ABNORMAL HIGH (ref 33.70–90.70)
WBC: 6.4 10*3/uL (ref 3.9–10.3)
lymph#: 2.9 10*3/uL (ref 0.9–3.3)
nRBC: 0 % (ref 0–0)

## 2015-04-16 LAB — COMPREHENSIVE METABOLIC PANEL (CC13)
ALK PHOS: 48 U/L (ref 40–150)
ALT: 26 U/L (ref 0–55)
AST: 43 U/L — ABNORMAL HIGH (ref 5–34)
Albumin: 3.2 g/dL — ABNORMAL LOW (ref 3.5–5.0)
Anion Gap: 6 mEq/L (ref 3–11)
BUN: 10.5 mg/dL (ref 7.0–26.0)
CALCIUM: 9.3 mg/dL (ref 8.4–10.4)
CO2: 28 mEq/L (ref 22–29)
Chloride: 106 mEq/L (ref 98–109)
Creatinine: 0.9 mg/dL (ref 0.6–1.1)
EGFR: 85 mL/min/{1.73_m2} — AB (ref >90)
Glucose: 119 mg/dl (ref 70–140)
POTASSIUM: 3.7 meq/L (ref 3.5–5.1)
Sodium: 140 mEq/L (ref 136–145)
Total Bilirubin: 1 mg/dL (ref 0.20–1.20)
Total Protein: 7.7 g/dL (ref 6.4–8.3)

## 2015-04-16 MED ORDER — SODIUM CHLORIDE 0.9 % IV SOLN
Freq: Once | INTRAVENOUS | Status: AC
  Administered 2015-04-16: 12:00:00 via INTRAVENOUS
  Filled 2015-04-16: qty 4

## 2015-04-16 MED ORDER — DEXTROSE 5 % IV SOLN
Freq: Once | INTRAVENOUS | Status: AC
  Administered 2015-04-16: 12:00:00 via INTRAVENOUS

## 2015-04-16 MED ORDER — OXALIPLATIN CHEMO INJECTION 100 MG/20ML
130.0000 mg/m2 | Freq: Once | INTRAVENOUS | Status: AC
  Administered 2015-04-16: 315 mg via INTRAVENOUS
  Filled 2015-04-16: qty 63

## 2015-04-16 NOTE — Patient Instructions (Signed)
[facility] Discharge Instructions for Patients Receiving Chemotherapy  Today you received the following chemotherapy agents: Oxaliplatin.  To help prevent nausea and vomiting after your treatment, we encourage you to take your nausea medication: Zofran 8 mg 2 x a day starting after chemo.   If you develop nausea and vomiting that is not controlled by your nausea medication, call the clinic.   BELOW ARE SYMPTOMS THAT SHOULD BE REPORTED IMMEDIATELY:  *FEVER GREATER THAN 100.5 F  *CHILLS WITH OR WITHOUT FEVER  NAUSEA AND VOMITING THAT IS NOT CONTROLLED WITH YOUR NAUSEA MEDICATION  *UNUSUAL SHORTNESS OF BREATH  *UNUSUAL BRUISING OR BLEEDING  TENDERNESS IN MOUTH AND THROAT WITH OR WITHOUT PRESENCE OF ULCERS  *URINARY PROBLEMS  *BOWEL PROBLEMS  UNUSUAL RASH Items with * indicate a potential emergency and should be followed up as soon as possible.  Feel free to call the clinic you have any questions or concerns. The clinic phone number is [phone].  Please show the [facility] at check-in to the Emergency Department and triage nurse.

## 2015-04-16 NOTE — Progress Notes (Signed)
[facility]COL[facility] OFF[facility] PROGRESS NOTE DATE OF VISIT: 04/16/2015   Veronica Drafts., FNP [address] [place] [id number]  DIAGNOSIS: recurrent No diagnosis found.  PROBLEM LIST 1. Left breast DCIS diagnosed in 2006 and 2010, status post lumpectomy. 2. pT2N0M0 stage I colon cancer, s/p partial colectomy on 03/09/2014  3. PE in 12/2013 when she was diagnosed with colon cancer.  Oncology History   Cancer of sigmoid colon   Staging form: Colon and Rectum, AJCC 7th Edition     Clinical: Stage I (T2, N0, M0) - Unsigned       Breast cancer   2006 Cancer Diagnosis Left breast DCIS diagnosed in 2006 and 2010, status post lumpectomy.   01/07/2014 Initial Diagnosis Breast cancer   01/18/2015 Mammogram 63m mass in lower outer quadrant of right breast    02/01/2015 Initial Biopsy right breast mass biopsy showed invasive ductal carcinoma    02/01/2015 Receptors her2 ER 90%+, PR 10%+, kI67 10%, HER2 pending     Cancer of sigmoid colon   03/04/2014 Initial Diagnosis Cancer of sigmoid colon   03/09/2014 Pathology Results G1 invasive adenocarcinoma, no lymph-Vascular invasion or Peri-neural invasion: Absent. MMR normal, MSI stable.    03/09/2014 Surgery partial colectomy, negative margins.    11/10/2014 Relapse/Recurrence biopsy confirmed oligo liver recurrence    11/10/2014 Imaging PET showed two small ares of hypermetabolism in the right lobe of the liver, no other distant metastasis.    11/30/2014 Imaging abdomen MRI showed a new enhancing lesion which corresponds to an area of abnormal hyper metabolism on recent PET-CT. Stable enlarged portal caval lymph node.    01/02/2015 Pathology Results Liver biopsy showed metastatic adenocarcinoma, IHC (+) for CK20, CDX-2, (-) for TTF-1 and ER   01/02/2015 Miscellaneous liver biopsy KRAS mutation (+)    02/14/2015 Imaging CT showed:  the small metastatic right hepatic lobe liver lesion is not identified for certain on this  examination. No new lesions. Stable small cysts in segment 4 a.    03/05/2015 -  Chemotherapy CAPEOX (capecitabine 2500 mg twice daily Day 1-14, oxaliplatin 1 30 mg/m on day 1, every 21 days     CURRENT TREATMENT:  CAPEOX (capecitabine 2500 mg twice daily Day 1-14, oxaliplatin 130 mg/m on day 1, every 21 days, started on 03/05/2015)   INTERVAL HISTORY:  Veronica GEIST59y.o. female with a history of breast cancer, colon cancer s/p laparoscopic-assisted partial colectomy (03/09/2014), PE (01/06/2014), and recently diagnosed breast cancer (01/2015),  presents to clinic for follow up and third cycle chemo.   She tolerated the second cycle chemo moderate well. She did have moderate fatigue, able to take care of her basic needs, but not much other activities, it lasted for 4-5 days, and she recovered well. She had no fever or chills, no significant nausea or vomiting. Her weight is stable. She occasionally has some right frank pain when she walks for long distance, which she has not taking any pain medications.   MEDICAL HISTORY: Past Medical History  Diagnosis Date  . Hypertension   . Shortness of breath     sob with exertion. dx. with Pulmonary emboli 4'15 ,tx. Xarelto,Lovenox used for [employer]  . Pulmonary embolism     "blood clot in lungs" -dx. 4'15 with CT Chest.  . Liver lesion   . Cancer     left breast cancer x2  . Breast CA     radiation and surgery-lt.  . Colon cancer  ALLERGIES:  is allergic to tramadol.  MEDICATIONS:    Medication List       This list is accurate as of: 04/16/15 10:50 AM.  Always use your most recent med list.               amLODipine 2.5 MG tablet  Commonly known as:  NORVASC  Take 2.5 mg by mouth daily.     capecitabine 500 MG tablet  Commonly known as:  XELODA  Take 5 tablets (2,500 mg total) by mouth 2 (two) times daily after a meal. Take on days 1-14 of chemotherapy ;   Rest   7  Days.     ferrous sulfate 325 (65 FE) MG tablet  Take  325 mg by mouth every morning.     hydrochlorothiazide 25 MG tablet  Commonly known as:  HYDRODIURIL  Take 25 mg by mouth every morning.     metoprolol 50 MG tablet  Commonly known as:  LOPRESSOR  Take 50 mg by mouth 2 (two) times daily.     ondansetron 8 MG tablet  Commonly known as:  ZOFRAN  Take 1 tablet (8 mg total) by mouth 2 (two) times daily. Start the day after chemo for 2 days. Then take as needed for nausea or vomiting.     potassium chloride SA 20 MEQ tablet  Commonly known as:  K-DUR,KLOR-CON  Take 1 tablet (20 mEq total) by mouth 2 (two) times daily. Or as directed.     XARELTO 20 MG Tabs tablet  Generic drug:  rivaroxaban  TAKE ONE TABLET BY MOUTH EVERY DAY WITH SUPPER        SURGICAL HISTORY:  Past Surgical History  Procedure Laterality Date  . Cesarean section    . Breast surgery      '11- Left breast lumpectomy  . Cholecystectomy      Laparoscopic 20 yrs ago  . Partial colectomy N/A 03/09/2014    Procedure: LAPAROSCOPIC ASSISTED PARTIAL COLECTOMY, splenic flexure mobilization;  Surgeon: Leighton Ruff, MD;  Location: [facility];  Service: General;  Laterality: N/A;    REVIEW OF SYSTEMS:   Constitutional: Denies fevers, chills or abnormal weight loss Eyes: Denies blurriness of vision Ears, nose, mouth, throat, and face: Denies mucositis or sore throat Respiratory: Denies cough, dyspnea or wheezes Cardiovascular: Denies palpitation, chest discomfort or lower extremity swelling Gastrointestinal:  Denies nausea, heartburn or change in bowel habits Skin: Denies abnormal skin rashes Lymphatics: Denies new lymphadenopathy or easy bruising Neurological:Denies numbness, tingling or new weaknesses Behavioral/Psych: Mood is stable, no new changes  All other systems were reviewed with the patient and are negative. Breasts: Breast inspection showed them to be symmetrical with no nipple discharge. Palpation of the breasts and axilla revealed no obvious mass that I could  appreciate.   Old surgical scar in the left breast. (+)  Skin erythema below both breasts   PHYSICAL EXAMINATION: ECOG PERFORMANCE STATUS: 1 Blood pressure 138/76, pulse 63, temperature 98.7 F (37.1 C), temperature source Oral, resp. rate 18, height '5\' 5"'  (1.651 m), weight 281 lb 4.8 oz (127.597 kg), last menstrual period 01/23/2014, SpO2 100 %. GENERAL:alert, no distress and comfortable; well developed and obese. Easily mobile to exam table.  SKIN: skin color, texture, turgor are normal, no rashes or significant lesions EYES: normal, Conjunctiva are pink and non-injected, sclera clear OROPHARYNX:no exudate, no erythema and lips, buccal mucosa, and tongue normal  NECK: supple, thyroid normal size, non-tender, without nodularity LYMPH:  no palpable lymphadenopathy in the  cervical, axillary or supraclavicular LUNGS: clear to auscultation with normal breathing effort, no wheezes or rhonchi HEART: regular rate & rhythm and no murmurs and no lower extremity edema ABDOMEN:abdomen soft, non-tender and normal bowel sounds; incision healing with signs of infection. Rectal exam negative for palpable lesion, no blood on glove  Musculoskeletal:no cyanosis of digits and no clubbing  NEURO: alert & oriented x 3 with fluent speech, no focal motor/sensory deficits  Labs:  CBC Latest Ref Rng 04/16/2015 03/26/2015 03/14/2015  WBC 3.9 - 10.3 10e3/uL 6.4 7.0 9.0  Hemoglobin 11.6 - 15.9 g/dL 13.5 14.2 13.9  Hematocrit 34.8 - 46.6 % 39.9 41.5 40.5  Platelets 145 - 400 10e3/uL 180 246 261    CMP Latest Ref Rng 04/16/2015 03/26/2015 03/14/2015  Glucose 70 - 140 mg/dl 119 121 113  BUN 7.0 - 26.0 mg/dL 10.5 10.9 8.5  Creatinine 0.6 - 1.1 mg/dL 0.9 0.9 0.9  Sodium 136 - 145 mEq/L 140 141 139  Potassium 3.5 - 5.1 mEq/L 3.7 3.3(L) 3.0(LL)  Chloride 96 - 112 mEq/L - - -  CO2 22 - 29 mEq/L 28 24 30(H)  Calcium 8.4 - 10.4 mg/dL 9.3 9.9 9.4  Total Protein 6.4 - 8.3 g/dL 7.7 7.4 7.3  Total Bilirubin 0.20 - 1.20 mg/dL  1.00 0.54 0.59  Alkaline Phos 40 - 150 U/L 48 46 47  AST 5 - 34 U/L 43(H) 47(H) 50(H)  ALT 0 - 55 U/L 26 38 46    PATHOLOGY RESULT Diagnosis 01/02/2015 Liver, needle/core biopsy, right - POSITIVE FOR METASTATIC ADENOCARCINOMA. - SEE COMMENT. Microscopic Comment Immunohistochemical stains are performed. The tumor is positive for cytokeratin 20 and CDX-2. Cytokeratin 7 stains background bile ducts but is negative within the tumor. The tumor is negative for TTF-1 and estrogen receptor. The morphology coupled with the staining pattern is consistent with metastatic adenocarcinoma of colorectal primary source.   Diagnosis 02/01/2015 Breast, right, needle core biopsy, LOQ - INVASIVE DUCTAL CARCINOMA, SEE COMMENT. - DUCTAL CARCINOMA IN SITU. Microscopic Comment Although the grade of tumor is best assessed at resection, with these biopsies, both the invasive and in situ carcinoma are grade I-II. Breast prognostic studies are pending and will be reported in an addendum. Both the invasive and in situ carcinoma demonstrate strongly diffuse E. cadherin expression; supporting a ductal phenotype. The absence of the myoepithelial layer was confirmed with smooth muscle myosin heavy chain, calponin, and p63 immunostains. The case was reviewed by Dr Avis Epley who concurs.  Results: IMMUNOHISTOCHEMICAL AND MORPHOMETRIC ANALYSIS BY THE AUTOMATED CELLULAR IMAGING SYSTEM (ACIS) Estrogen Receptor: 90%, POSITIVE, STRONG STAINING INTENSITY (PERFORMED MANUALLY) Progesterone Receptor: 10%, POSITIVE, STRONG STAINING INTENSITY (PERFORMED MANUALLY) Proliferation Marker Ki67: 10%  HER2 - NEGATIVE RATIO OF HER2/CEP17 SIGNALS 1.09 AVERAGE HER2 COPY NUMBER PER CELL 1.90  PET 11/10/2014  IMPRESSION: 1. Status post sigmoidectomy with two small areas of hypermetabolism in the right lobe of the liver, which are concerning for potential metastatic lesions, as above. This could be confirmed with MRI of the abdomen  with and without IV gadolinium. 2. There is some relatively diffuse hypermetabolism in the rectum, which is favored to be either physiologic or related to a proctitis. No focal mass like soft tissue is noted adjacent to the suture line to suggest local recurrence of disease. 3. Small focus of ground-glass attenuation in the medial aspect of the right lower lobe, favored to reflect developing fibrosis related to adjacent large marginal osteophytes in the thoracic spine (reference: AJR Am J Roentgenol. 2002 Oct;179(4):893-6.). There  is some low-level hypermetabolism in this region at this time, however, this finding is strongly favored to be benign. Attention at time of follow-up imaging is recommended to ensure the stability or resolution of these findings. 4. Dilatation of the pulmonic trunk (3.9 cm in diameter), suggesting pulmonary arterial hypertension.   ASSESSMENT: Veronica Reyes 59 y.o. female with a history of  1.Colon Cancer, Stage I (pT2N0), MMR normal, oligo recurrent disease in liver in 12/2014, KRAS mutation (+) --I discussed the recent abdominal MRI and PET scan  and personally reviewed the scan with her. There are 2 small uptake in the liver, corresponding to a liver lesion with enhancement in segment 7 of MRI, this is highly suspicious for metastatic disease.  -I discussed the liver biopsy result with her in great details, which is consistent with metastatic colon cancer -I reviewed the nature history of metastatic colon cancer, and the potential curative approach with chemotherapy followed by liver lesion ablation or resection. However, more likely, this is not curable disease. -her tumor has K-ras mutation, so she will not benefit from EGFR targeted therapy -I discussed the option of first-line chemotherapy, including FOLFOX, CAPEOX, with or without Avastin. She does not want a port placement and opted CAPEOX. Capecitabine was approved by her insurance and she has received  it.   -Her case was discussed in our GI tumor Board, and the recommendation is systemic chemotherapy followed by surgical resection for the liver lesion  - I reviewed her restaging CT scan from 02/14/2015, interestingly, without any treatment,  the liver lesion got smaller and not well  Visualized in the CT scan. -She tolerated the first two cycles CAPEOX well. Lab reviewed, adequate for treatment, we'll proceed with cycle 3 today.  2. History of PE (01/06/2014) --She has a diagnosis of pulmonary embolism, likely secondary to malignancy in the setting of family history for stroke (and possible stroke history in patient as well). Lower extremity dopplers negative.  -continue Xarelto.   3. New right breast cancer, T1aN0M0, stage Ia, ER+/PR+/HER2-, History of left Breast Cancer --Left CIS in situ, s/p lumpectomy in 2006 and 2011 by Dr. Margot Chimes, and radiation in 2011 by Dr. Sondra Come. Last mammogram on 01/09/14 negative - I discussed her right breast imaging finding and biopsy results.  Unfortunately she has early-stage right breast cancer now.  - I discussed the standard treatment option is lumpectomy plus sentinel lymph nodes for her breast cancer,  Followed by adjuvant radiation and endocrine therapy ( antiestrogen therapy).  -She will see Dr. [name] for the surgery. Her appointment was rescheduled due to her insurance issue   4.  Uterine Fibroids  --Negative endometrial biopsy. Following with Gynecology.  6. Kidney lesion (Right). -No hypermetabolic right kidney lesion on the pet scan -Repeat abdominal MRI  7. Coping  -She was quite overwhelmed by the diagnosis of metastatic colon cancer.  -She is coping much better now, and she has good support from her husband  Plan  -Cycle 3 chemo today  -Return to clinic in 3 weeks for cycle 4 CAPEOX (likely last cycle chemo before surgery)  -See Dr. [name] in the next few weeks, she will call to reschedule her appointment   All questions were answered.  The patient knows to call the clinic with any problems, questions or concerns. We can certainly see the patient much sooner if necessary.  I spent 25 minutes counseling the patient face to face. The total time spent in the appointment was 30 minutes.   Burr Medico,  Krista Blue  04/16/2015

## 2015-04-16 NOTE — Telephone Encounter (Signed)
Added appt per pof...the patient will get sched in chemo °

## 2015-04-16 NOTE — Progress Notes (Signed)
Oncology Nurse Navigator Documentation  Oncology Nurse Navigator Flowsheets 04/16/2015  Navigator Encounter Type Treatment room-1 month f/u  Patient Visit Type Medonc  Barriers/Navigation Needs No barriers at this time  Support Groups/Services -  Time Spent with Patient 10  Some discomfort with IV site due to drug infusing. Treatment nurse assessed IV site and has applied warm compress for comfort. Encouraged patient to consider PAC if further treatment is necessary.

## 2015-04-18 ENCOUNTER — Telehealth: Payer: Self-pay | Admitting: *Deleted

## 2015-04-18 NOTE — Telephone Encounter (Signed)
Called Veronica Reyes and told her Dr. [name] would like to see her at the [facility] on Friday, August 5th at 1030/arrive at 1015. She understands and agrees to this appointment. Faxed demographic info and last office visit to [place] [phone]

## 2015-04-20 ENCOUNTER — Encounter: Payer: 59 | Admitting: Nutrition

## 2015-04-24 NOTE — Telephone Encounter (Signed)
e

## 2015-05-01 ENCOUNTER — Telehealth: Payer: Self-pay | Admitting: *Deleted

## 2015-05-01 NOTE — Telephone Encounter (Signed)
VM message received from patient @ 11:24 am regarding the scheduling of an abdominal MRI scan. She is not sure when Dr. Burr Medico wants her to have it.

## 2015-05-01 NOTE — Telephone Encounter (Signed)
TC to patient and let her know that Dr. Burr Medico would order the MRI at her next visit which is 05/07/15. Marquise voiced understanding.

## 2015-05-01 NOTE — Telephone Encounter (Signed)
Please let her know it will be before her surgery. I will order it when I see her next time.  Thanks.  [name]  05/01/2015

## 2015-05-04 ENCOUNTER — Other Ambulatory Visit: Payer: Self-pay

## 2015-05-04 ENCOUNTER — Other Ambulatory Visit: Payer: Self-pay | Admitting: General Surgery

## 2015-05-04 DIAGNOSIS — C187 Malignant neoplasm of sigmoid colon: Secondary | ICD-10-CM

## 2015-05-04 NOTE — Addendum Note (Signed)
Addended by: Stark Klein on: 05/04/2015 12:03 PM   Modules accepted: Orders

## 2015-05-07 ENCOUNTER — Other Ambulatory Visit: Payer: Self-pay | Admitting: General Surgery

## 2015-05-07 ENCOUNTER — Encounter: Payer: Self-pay | Admitting: Hematology

## 2015-05-07 ENCOUNTER — Other Ambulatory Visit (HOSPITAL_BASED_OUTPATIENT_CLINIC_OR_DEPARTMENT_OTHER): Payer: 59

## 2015-05-07 ENCOUNTER — Other Ambulatory Visit: Payer: Self-pay

## 2015-05-07 ENCOUNTER — Ambulatory Visit (HOSPITAL_BASED_OUTPATIENT_CLINIC_OR_DEPARTMENT_OTHER): Payer: 59 | Admitting: Hematology

## 2015-05-07 ENCOUNTER — Telehealth: Payer: Self-pay | Admitting: *Deleted

## 2015-05-07 ENCOUNTER — Ambulatory Visit: Payer: 59

## 2015-05-07 ENCOUNTER — Telehealth: Payer: Self-pay | Admitting: Hematology

## 2015-05-07 VITALS — BP 152/86 | HR 82 | Temp 98.5°F | Resp 18 | Ht 65.0 in | Wt 278.9 lb

## 2015-05-07 DIAGNOSIS — C187 Malignant neoplasm of sigmoid colon: Secondary | ICD-10-CM

## 2015-05-07 DIAGNOSIS — C787 Secondary malignant neoplasm of liver and intrahepatic bile duct: Secondary | ICD-10-CM

## 2015-05-07 DIAGNOSIS — D509 Iron deficiency anemia, unspecified: Secondary | ICD-10-CM | POA: Diagnosis not present

## 2015-05-07 DIAGNOSIS — C50511 Malignant neoplasm of lower-outer quadrant of right female breast: Secondary | ICD-10-CM

## 2015-05-07 DIAGNOSIS — C50911 Malignant neoplasm of unspecified site of right female breast: Secondary | ICD-10-CM

## 2015-05-07 DIAGNOSIS — E876 Hypokalemia: Secondary | ICD-10-CM

## 2015-05-07 LAB — COMPREHENSIVE METABOLIC PANEL (CC13)
ALBUMIN: 3 g/dL — AB (ref 3.5–5.0)
ALT: 24 U/L (ref 0–55)
AST: 48 U/L — AB (ref 5–34)
Alkaline Phosphatase: 53 U/L (ref 40–150)
Anion Gap: 10 mEq/L (ref 3–11)
BUN: 10.8 mg/dL (ref 7.0–26.0)
CHLORIDE: 108 meq/L (ref 98–109)
CO2: 24 mEq/L (ref 22–29)
Calcium: 9.4 mg/dL (ref 8.4–10.4)
Creatinine: 0.8 mg/dL (ref 0.6–1.1)
EGFR: >90 mL/min/{1.73_m2} (ref >90)
GLUCOSE: 89 mg/dL (ref 70–140)
Potassium: 2.9 mEq/L — CL (ref 3.5–5.1)
SODIUM: 143 meq/L (ref 136–145)
Total Bilirubin: 0.94 mg/dL (ref 0.20–1.20)
Total Protein: 7.6 g/dL (ref 6.4–8.3)

## 2015-05-07 LAB — CBC & DIFF AND RETIC
BASO%: 0.3 % (ref 0.0–2.0)
BASOS ABS: 0 10*3/uL (ref 0.0–0.1)
EOS ABS: 0 10*3/uL (ref 0.0–0.5)
EOS%: 0.4 % (ref 0.0–7.0)
HEMATOCRIT: 37.1 % (ref 34.8–46.6)
HEMOGLOBIN: 12.7 g/dL (ref 11.6–15.9)
Immature Retic Fract: 16.9 % — ABNORMAL HIGH (ref 1.60–10.00)
LYMPH#: 3.2 10*3/uL (ref 0.9–3.3)
LYMPH%: 47.7 % (ref 14.0–49.7)
MCH: 35.4 pg — ABNORMAL HIGH (ref 25.1–34.0)
MCHC: 34.2 g/dL (ref 31.5–36.0)
MCV: 103.3 fL — ABNORMAL HIGH (ref 79.5–101.0)
MONO#: 0.6 10*3/uL (ref 0.1–0.9)
MONO%: 8.6 % (ref 0.0–14.0)
NEUT#: 2.9 10*3/uL (ref 1.5–6.5)
NEUT%: 43 % (ref 38.4–76.8)
Platelets: 156 10*3/uL (ref 145–400)
RBC: 3.59 10*6/uL — ABNORMAL LOW (ref 3.70–5.45)
RDW: 22.4 % — ABNORMAL HIGH (ref 11.2–14.5)
RETIC %: 5.98 % — AB (ref 0.70–2.10)
RETIC CT ABS: 214.68 10*3/uL — AB (ref 33.70–90.70)
WBC: 6.7 10*3/uL (ref 3.9–10.3)

## 2015-05-07 MED ORDER — POTASSIUM CHLORIDE CRYS ER 20 MEQ PO TBCR: 20.0000 meq | EXTENDED_RELEASE_TABLET | Freq: Three times a day (TID) | ORAL | Status: DC

## 2015-05-07 NOTE — Telephone Encounter (Signed)
Gave adn printed appt sched and avs for pt for Aug and Sept °

## 2015-05-07 NOTE — Telephone Encounter (Signed)
Patient;'s daughter called to speak with her.  Reports "she just called me from t here.  Her brother is on his way to pick her up."  This nurse searched office and went to lobby calling her name.  No answer to this name in sub wait or waiting area.  Informed dfaughter she may have already left with her brother.

## 2015-05-07 NOTE — Addendum Note (Signed)
Addended by: Stark Klein on: 05/07/2015 11:12 AM   Modules accepted: Orders

## 2015-05-07 NOTE — Progress Notes (Signed)
Veronica Reyes DATE OF VISIT: 05/07/2015   Veronica Reyes., FNP [name] [place] [id number]  DIAGNOSIS: recurrent Breast cancer, right  Iron deficiency anemia  PROBLEM LIST 1. Left breast DCIS diagnosed in 2006 and 2010, status post lumpectomy. 2. pT2N0M0 stage I colon cancer, s/p partial colectomy on 03/09/2014  3. PE in 12/2013 when she was diagnosed with colon cancer.  Oncology History   Cancer of sigmoid colon   Staging form: Colon and Rectum, AJCC 7th Edition     Clinical: Stage I (T2, N0, M0) - Unsigned       Breast cancer   2006 Cancer Diagnosis Left breast DCIS diagnosed in 2006 and 2010, status post lumpectomy.   01/07/2014 Initial Diagnosis Breast cancer   01/18/2015 Mammogram 40m mass in lower outer quadrant of right breast    02/01/2015 Initial Biopsy right breast mass biopsy showed invasive ductal carcinoma    02/01/2015 Receptors her2 ER 90%+, PR 10%+, kI67 10%, HER2 pending     Cancer of sigmoid colon   03/04/2014 Initial Diagnosis Cancer of sigmoid colon   03/09/2014 Pathology Results G1 invasive adenocarcinoma, no lymph-Vascular invasion or Peri-neural invasion: Absent. MMR normal, MSI stable.    03/09/2014 Surgery partial colectomy, negative margins.    11/10/2014 Relapse/Recurrence biopsy confirmed oligo liver recurrence    11/10/2014 Imaging PET showed two small ares of hypermetabolism in the right lobe of the liver, no other distant metastasis.    11/30/2014 Imaging abdomen MRI showed a new enhancing lesion which corresponds to an area of abnormal hyper metabolism on recent PET-CT. Stable enlarged portal caval lymph node.    01/02/2015 Pathology Results Liver biopsy showed metastatic adenocarcinoma, IHC (+) for CK20, CDX-2, (-) for TTF-1 and ER   01/02/2015 Miscellaneous liver biopsy KRAS mutation (+)    02/14/2015 Imaging CT showed:  the small metastatic right hepatic lobe liver lesion is not identified  for certain on this examination. No new lesions. Stable small cysts in segment 4 a.    03/05/2015 -  Chemotherapy CAPEOX (capecitabine 2500 mg twice daily Day 1-14, oxaliplatin 1 30 mg/m on day 1, every 21 days     CURRENT TREATMENT:  Pending breast and liver surgery    INTERVAL HISTORY:  Veronica DUNSMORE89y.o. female with a history of breast cancer, colon cancer s/p laparoscopic-assisted partial colectomy (03/09/2014), PE (01/06/2014), and recently diagnosed breast cancer (01/2015),  presents to clinic for follow up.   She developed moderate fatigue, anorexia, nausea after the third cycle chemotherapy, worse than before. Zofran didn't help with her nausea. She was not able to eat much or doing much for the first and second week when she was on chemotherapy. She did recover well by third week, and he feels back to normal now. She ran out of her potassium pill, and took the last dose 2 days ago. She otherwise denies any significant pain, nausea, or other other complaints.   MEDICAL HISTORY: Past Medical History  Diagnosis Date  . Hypertension   . Shortness of breath     sob with exertion. dx. with Pulmonary emboli 4'15 ,tx. Xarelto,Lovenox used for [employer]  . Pulmonary embolism     "blood clot in lungs" -dx. 4'15 with CT Chest.  . Liver lesion   . Cancer     left breast cancer x2  . Breast CA     radiation and surgery-lt.  . Colon cancer     ALLERGIES:  is allergic to tramadol.  MEDICATIONS:    Medication List       This list is accurate as of: 05/07/15 11:44 AM.  Always use your most recent med list.               amLODipine 2.5 MG tablet  Commonly known as:  NORVASC  Take 2.5 mg by mouth daily.     capecitabine 500 MG tablet  Commonly known as:  XELODA  Take 5 tablets (2,500 mg total) by mouth 2 (two) times daily after a meal. Take on days 1-14 of chemotherapy ;   Rest   7  Days.     ferrous sulfate 325 (65 FE) MG tablet  Take 325 mg by mouth 3 (three) times  daily with meals.     hydrochlorothiazide 25 MG tablet  Commonly known as:  HYDRODIURIL  Take 25 mg by mouth every morning.     metoprolol 50 MG tablet  Commonly known as:  LOPRESSOR  Take 50 mg by mouth 2 (two) times daily.     ondansetron 8 MG tablet  Commonly known as:  ZOFRAN  Take 1 tablet (8 mg total) by mouth 2 (two) times daily. Start the day after chemo for 2 days. Then take as needed for nausea or vomiting.     potassium chloride SA 20 MEQ tablet  Commonly known as:  K-DUR,KLOR-CON  Take 1 tablet (20 mEq total) by mouth 2 (two) times daily. Or as directed.     XARELTO 20 MG Tabs tablet  Generic drug:  rivaroxaban  TAKE ONE TABLET BY MOUTH EVERY DAY WITH SUPPER        SURGICAL HISTORY:  Past Surgical History  Procedure Laterality Date  . Cesarean section    . Breast surgery      '11- Left breast lumpectomy  . Cholecystectomy      Laparoscopic 20 yrs ago  . Partial colectomy N/A 03/09/2014    Procedure: LAPAROSCOPIC ASSISTED PARTIAL COLECTOMY, splenic flexure mobilization;  Surgeon: Leighton Ruff, MD;  Location: [facility];  Service: General;  Laterality: N/A;    REVIEW OF SYSTEMS:   Constitutional: Denies fevers, chills or abnormal weight loss Eyes: Denies blurriness of vision Ears, nose, mouth, throat, and face: Denies mucositis or sore throat Respiratory: Denies cough, dyspnea or wheezes Cardiovascular: Denies palpitation, chest discomfort or lower extremity swelling Gastrointestinal:  Denies nausea, heartburn or change in bowel habits Skin: Denies abnormal skin rashes Lymphatics: Denies new lymphadenopathy or easy bruising Neurological:Denies numbness, tingling or new weaknesses Behavioral/Psych: Mood is stable, no new changes  All other systems were reviewed with the patient and are negative. Breasts: Breast inspection showed them to be symmetrical with no nipple discharge. Palpation of the breasts and axilla revealed no obvious mass that I could appreciate.    Old surgical scar in the left breast. (+)  Skin erythema below both breasts   PHYSICAL EXAMINATION: ECOG PERFORMANCE STATUS: 1 Blood pressure 152/86, pulse 82, temperature 98.5 F (36.9 C), temperature source Oral, resp. rate 18, height '5\' 5"'  (1.651 m), weight 278 lb 14.4 oz (126.508 kg), last menstrual period 01/23/2014, SpO2 99 %. GENERAL:alert, no distress and comfortable; well developed and obese. Easily mobile to exam table.  SKIN: skin color, texture, turgor are normal, no rashes or significant lesions EYES: normal, Conjunctiva are pink and non-injected, sclera clear OROPHARYNX:no exudate, no erythema and lips, buccal mucosa, and tongue normal  NECK: supple, thyroid normal size, non-tender, without nodularity LYMPH:  no palpable lymphadenopathy  in the cervical, axillary or supraclavicular LUNGS: clear to auscultation with normal breathing effort, no wheezes or rhonchi HEART: regular rate & rhythm and no murmurs and no lower extremity edema ABDOMEN:abdomen soft, non-tender and normal bowel sounds; incision healing with signs of infection. Rectal exam negative for palpable lesion, no blood on glove  Musculoskeletal:no cyanosis of digits and no clubbing  NEURO: alert & oriented x 3 with fluent speech, no focal motor/sensory deficits  Labs:  CBC Latest Ref Rng 05/07/2015 04/16/2015 03/26/2015  WBC 3.9 - 10.3 10e3/uL 6.7 6.4 7.0  Hemoglobin 11.6 - 15.9 g/dL 12.7 13.5 14.2  Hematocrit 34.8 - 46.6 % 37.1 39.9 41.5  Platelets 145 - 400 10e3/uL 156 180 246    CMP Latest Ref Rng 05/07/2015 04/16/2015 03/26/2015  Glucose 70 - 140 mg/dl 89 119 121  BUN 7.0 - 26.0 mg/dL 10.8 10.5 10.9  Creatinine 0.6 - 1.1 mg/dL 0.8 0.9 0.9  Sodium 136 - 145 mEq/L 143 140 141  Potassium 3.5 - 5.1 mEq/L 2.9(LL) 3.7 3.3(L)  Chloride 96 - 112 mEq/L - - -  CO2 22 - 29 mEq/L '24 28 24  ' Calcium 8.4 - 10.4 mg/dL 9.4 9.3 9.9  Total Protein 6.4 - 8.3 g/dL 7.6 7.7 7.4  Total Bilirubin 0.20 - 1.20 mg/dL 0.94 1.00 0.54   Alkaline Phos 40 - 150 U/L 53 48 46  AST 5 - 34 U/L 48(H) 43(H) 47(H)  ALT 0 - 55 U/L 24 26 38    PATHOLOGY RESULT Diagnosis 01/02/2015 Liver, needle/core biopsy, right - POSITIVE FOR METASTATIC ADENOCARCINOMA. - SEE COMMENT. Microscopic Comment Immunohistochemical stains are performed. The tumor is positive for cytokeratin 20 and CDX-2. Cytokeratin 7 stains background bile ducts but is negative within the tumor. The tumor is negative for TTF-1 and estrogen receptor. The morphology coupled with the staining pattern is consistent with metastatic adenocarcinoma of colorectal primary source.   Diagnosis 02/01/2015 Breast, right, needle core biopsy, LOQ - INVASIVE DUCTAL CARCINOMA, SEE COMMENT. - DUCTAL CARCINOMA IN SITU. Microscopic Comment Although the grade of tumor is best assessed at resection, with these biopsies, both the invasive and in situ carcinoma are grade I-II. Breast prognostic studies are pending and will be reported in an addendum. Both the invasive and in situ carcinoma demonstrate strongly diffuse E. cadherin expression; supporting a ductal phenotype. The absence of the myoepithelial layer was confirmed with smooth muscle myosin heavy chain, calponin, and p63 immunostains. The case was reviewed by Dr Avis Epley who concurs.  Results: IMMUNOHISTOCHEMICAL AND MORPHOMETRIC ANALYSIS BY THE AUTOMATED CELLULAR IMAGING SYSTEM (ACIS) Estrogen Receptor: 90%, POSITIVE, STRONG STAINING INTENSITY (PERFORMED MANUALLY) Progesterone Receptor: 10%, POSITIVE, STRONG STAINING INTENSITY (PERFORMED MANUALLY) Proliferation Marker Ki67: 10%  HER2 - NEGATIVE RATIO OF HER2/CEP17 SIGNALS 1.09 AVERAGE HER2 COPY NUMBER PER CELL 1.90  PET 11/10/2014  IMPRESSION: 1. Status post sigmoidectomy with two small areas of hypermetabolism in the right lobe of the liver, which are concerning for potential metastatic lesions, as above. This could be confirmed with MRI of the abdomen with and without  IV gadolinium. 2. There is some relatively diffuse hypermetabolism in the rectum, which is favored to be either physiologic or related to a proctitis. No focal mass like soft tissue is noted adjacent to the suture line to suggest local recurrence of disease. 3. Small focus of ground-glass attenuation in the medial aspect of the right lower lobe, favored to reflect developing fibrosis related to adjacent large marginal osteophytes in the thoracic spine (reference: AJR Am J Roentgenol. 2002  Oct;179(4):893-6.). There is some low-level hypermetabolism in this region at this time, however, this finding is strongly favored to be benign. Attention at time of follow-up imaging is recommended to ensure the stability or resolution of these findings. 4. Dilatation of the pulmonic trunk (3.9 cm in diameter), suggesting pulmonary arterial hypertension.   ASSESSMENT: Veronica Reyes 59 y.o. female with a history of  1.Colon Cancer, Stage I (pT2N0), MMR normal, oligo recurrent disease in liver in 12/2014, KRAS mutation (+) --I discussed the recent abdominal MRI and PET scan  and personally reviewed the scan with her. There are 2 small uptake in the liver, corresponding to a liver lesion with enhancement in segment 7 of MRI, this is highly suspicious for metastatic disease.  -I discussed the liver biopsy result with her in great details, which is consistent with metastatic colon cancer -I reviewed the nature history of metastatic colon cancer, and the potential curative approach with chemotherapy followed by liver lesion ablation or resection. However, more likely, this is not curable disease. -her tumor has K-ras mutation, so she will not benefit from EGFR targeted therapy -I discussed the option of first-line chemotherapy, including FOLFOX, CAPEOX, with or without Avastin. She does not want a port placement and opted CAPEOX. Capecitabine was approved by her insurance and she has received it.   -Her case  was discussed in our GI tumor Board, and the recommendation is systemic chemotherapy followed by surgical resection for the liver lesion  - I reviewed her restaging CT scan from 02/14/2015, interestingly, without any treatment,  the liver lesion got smaller and not well  Visualized in the CT scan. -She is status post 3 cycles of Capeox, does not want to proceed with fourth cycle due to the moderate side effects from previous chemotherapy treatment. I spoke with Dr. [name] and she will likely proceed with surgery soon. -She will have a repeated abdominal MRI before surgery.  2. History of PE (01/06/2014) --She has a diagnosis of pulmonary embolism, likely secondary to malignancy in the setting of family history for stroke (and possible stroke history in patient as well). Lower extremity dopplers negative.  -continue Xarelto.   3. New right breast cancer, T1aN0M0, stage Ia, ER+/PR+/HER2-, History of left Breast Cancer --Left CIS in situ, s/p lumpectomy in 2006 and 2011 by Dr. Margot Chimes, and radiation in 2011 by Dr. Sondra Come. Last mammogram on 01/09/14 negative - I discussed her right breast imaging finding and biopsy results.  Unfortunately she has early-stage right breast cancer now.  - I discussed the standard treatment option is lumpectomy plus sentinel lymph nodes for her breast cancer,  Followed by adjuvant radiation and endocrine therapy ( antiestrogen therapy).  -Dr. [name] is planning to do her breast and liver surgery at the same time soon.  4.  Uterine Fibroids  --Negative endometrial biopsy. Following with Gynecology.  6. Kidney lesion (Right). -No hypermetabolic right kidney lesion on the pet scan -Repeat abdominal MRI  7. Coping  -She was quite overwhelmed by the diagnosis of metastatic colon cancer.  -She is coping much better now, and she has good support from her husband  8. Hypokalemia -K2.9 today, she ran out of her potassium pill 2 days ago -I reviewed her potassium pill, she  knows to take 3 tablets today and tid daily afterwards.  Plan  -no chemo today -She will have an abdominal MRI soon, which was ordered by Dr. [name] -She is going to have breast and a liver surgery by Dr. [name] soon  -  I will see her back in 4 weeks    All questions were answered. The patient knows to call the clinic with any problems, questions or concerns. We can certainly see the patient much sooner if necessary.  I spent 25 minutes counseling the patient face to face. The total time spent in the appointment was 30 minutes.   [name]  05/07/2015

## 2015-05-14 ENCOUNTER — Other Ambulatory Visit (HOSPITAL_BASED_OUTPATIENT_CLINIC_OR_DEPARTMENT_OTHER): Payer: 59

## 2015-05-14 DIAGNOSIS — C187 Malignant neoplasm of sigmoid colon: Secondary | ICD-10-CM

## 2015-05-14 DIAGNOSIS — C787 Secondary malignant neoplasm of liver and intrahepatic bile duct: Secondary | ICD-10-CM

## 2015-05-14 LAB — CBC & DIFF AND RETIC
BASO%: 0.5 % (ref 0.0–2.0)
BASOS ABS: 0 10*3/uL (ref 0.0–0.1)
EOS%: 0.8 % (ref 0.0–7.0)
Eosinophils Absolute: 0.1 10*3/uL (ref 0.0–0.5)
HEMATOCRIT: 39.6 % (ref 34.8–46.6)
HEMOGLOBIN: 13.5 g/dL (ref 11.6–15.9)
IMMATURE RETIC FRACT: 9.6 % (ref 1.60–10.00)
LYMPH%: 44.6 % (ref 14.0–49.7)
MCH: 35.8 pg — ABNORMAL HIGH (ref 25.1–34.0)
MCHC: 34.1 g/dL (ref 31.5–36.0)
MCV: 105 fL — AB (ref 79.5–101.0)
MONO#: 0.7 10*3/uL (ref 0.1–0.9)
MONO%: 10.4 % (ref 0.0–14.0)
NEUT%: 43.7 % (ref 38.4–76.8)
NEUTROS ABS: 2.8 10*3/uL (ref 1.5–6.5)
PLATELETS: 152 10*3/uL (ref 145–400)
RBC: 3.77 10*6/uL (ref 3.70–5.45)
RDW: 20.7 % — ABNORMAL HIGH (ref 11.2–14.5)
Retic %: 4.09 % — ABNORMAL HIGH (ref 0.70–2.10)
Retic Ct Abs: 154.19 10*3/uL — ABNORMAL HIGH (ref 33.70–90.70)
WBC: 6.5 10*3/uL (ref 3.9–10.3)
lymph#: 2.9 10*3/uL (ref 0.9–3.3)
nRBC: 0 % (ref 0–0)

## 2015-05-14 LAB — COMPREHENSIVE METABOLIC PANEL (CC13)
ALBUMIN: 3.2 g/dL — AB (ref 3.5–5.0)
ALK PHOS: 60 U/L (ref 40–150)
ALT: 19 U/L (ref 0–55)
ANION GAP: 8 meq/L (ref 3–11)
AST: 39 U/L — ABNORMAL HIGH (ref 5–34)
BILIRUBIN TOTAL: 0.77 mg/dL (ref 0.20–1.20)
BUN: 9.8 mg/dL (ref 7.0–26.0)
CO2: 25 mEq/L (ref 22–29)
CREATININE: 0.9 mg/dL (ref 0.6–1.1)
Calcium: 9.9 mg/dL (ref 8.4–10.4)
Chloride: 108 mEq/L (ref 98–109)
EGFR: 84 mL/min/{1.73_m2} — ABNORMAL LOW (ref >90)
Glucose: 109 mg/dl (ref 70–140)
Potassium: 3.4 mEq/L — ABNORMAL LOW (ref 3.5–5.1)
Sodium: 140 mEq/L (ref 136–145)
TOTAL PROTEIN: 8.1 g/dL (ref 6.4–8.3)

## 2015-05-15 ENCOUNTER — Other Ambulatory Visit: Payer: Self-pay

## 2015-05-16 ENCOUNTER — Inpatient Hospital Stay: Admission: RE | Admit: 2015-05-16 | Payer: 59 | Source: Ambulatory Visit

## 2015-05-23 ENCOUNTER — Telehealth: Payer: Self-pay | Admitting: *Deleted

## 2015-05-23 NOTE — Telephone Encounter (Signed)
Spoke with April @ [facility] and informed her re:  Per Dr. Burr Medico last office notes, pt decided not to start cycle 4 chemo due to side effects.  Pt to have surgery soon by Dr. [name].  April stated she will note that Xeloda is on HOLD for now until further notice.

## 2015-05-23 NOTE — Telephone Encounter (Signed)
Representative from [facility] called wanting to know if patient is still taking xeloda. Yes or No please give representative a call back at [phone] (April). Message forwarded to RN Thu.

## 2015-05-24 ENCOUNTER — Other Ambulatory Visit: Payer: Self-pay | Admitting: Hematology

## 2015-05-27 ENCOUNTER — Other Ambulatory Visit: Payer: Self-pay | Admitting: Hematology

## 2015-05-29 ENCOUNTER — Ambulatory Visit
Admission: RE | Admit: 2015-05-29 | Discharge: 2015-05-29 | Disposition: A | Discharge status: Home / Self Care | Payer: 59 | Source: Ambulatory Visit | Attending: General Surgery | Admitting: General Surgery

## 2015-05-29 MED ORDER — GADOBENATE DIMEGLUMINE 529 MG/ML IV SOLN
20.0000 mL | Freq: Once | INTRAVENOUS | Status: AC | PRN
  Administered 2015-05-29: 20 mL via INTRAVENOUS

## 2015-05-30 ENCOUNTER — Other Ambulatory Visit: Payer: Self-pay | Admitting: General Surgery

## 2015-05-30 DIAGNOSIS — R52 Pain, unspecified: Secondary | ICD-10-CM

## 2015-05-30 DIAGNOSIS — C187 Malignant neoplasm of sigmoid colon: Secondary | ICD-10-CM

## 2015-06-01 ENCOUNTER — Ambulatory Visit
Admission: RE | Admit: 2015-06-01 | Discharge: 2015-06-01 | Disposition: A | Discharge status: Home / Self Care | Payer: 59 | Source: Ambulatory Visit | Attending: General Surgery | Admitting: General Surgery

## 2015-06-01 ENCOUNTER — Other Ambulatory Visit: Payer: Self-pay | Admitting: Hematology

## 2015-06-01 DIAGNOSIS — C187 Malignant neoplasm of sigmoid colon: Secondary | ICD-10-CM

## 2015-06-01 NOTE — Telephone Encounter (Signed)
05/07/15 refill went to [facility]

## 2015-06-04 ENCOUNTER — Encounter: Payer: Self-pay | Admitting: Hematology

## 2015-06-04 ENCOUNTER — Encounter: Payer: 59 | Admitting: Hematology

## 2015-06-04 ENCOUNTER — Telehealth: Payer: Self-pay | Admitting: Nurse Practitioner

## 2015-06-04 NOTE — Progress Notes (Signed)
This encounter was created in error - please disregard.

## 2015-06-04 NOTE — Telephone Encounter (Signed)
pt cld to r/s appt to next week 9/26 due to trnsportation

## 2015-06-07 ENCOUNTER — Encounter: Payer: Self-pay | Admitting: Hematology

## 2015-06-07 NOTE — Progress Notes (Signed)
Per stephanie at [facility] said they have sent req for xeloda to [facility]

## 2015-06-11 ENCOUNTER — Encounter: Payer: Self-pay | Admitting: *Deleted

## 2015-06-11 ENCOUNTER — Ambulatory Visit (HOSPITAL_BASED_OUTPATIENT_CLINIC_OR_DEPARTMENT_OTHER): Payer: 59

## 2015-06-11 ENCOUNTER — Encounter: Payer: Self-pay | Admitting: Hematology

## 2015-06-11 ENCOUNTER — Ambulatory Visit (HOSPITAL_BASED_OUTPATIENT_CLINIC_OR_DEPARTMENT_OTHER): Payer: 59 | Admitting: Hematology

## 2015-06-11 VITALS — BP 133/67 | HR 71 | Temp 98.7°F | Resp 17 | Ht 65.0 in | Wt 281.6 lb

## 2015-06-11 DIAGNOSIS — Z23 Encounter for immunization: Secondary | ICD-10-CM | POA: Diagnosis not present

## 2015-06-11 DIAGNOSIS — C50911 Malignant neoplasm of unspecified site of right female breast: Secondary | ICD-10-CM

## 2015-06-11 DIAGNOSIS — C187 Malignant neoplasm of sigmoid colon: Secondary | ICD-10-CM

## 2015-06-11 MED ORDER — INFLUENZA VAC SPLIT QUAD 0.5 ML IM SUSY
0.5000 mL | PREFILLED_SYRINGE | Freq: Once | INTRAMUSCULAR | Status: AC
  Administered 2015-06-11: 0.5 mL via INTRAMUSCULAR
  Filled 2015-06-11: qty 0.5

## 2015-06-11 NOTE — Progress Notes (Signed)
Oncology Nurse Navigator Documentation  Oncology Nurse Navigator Flowsheets 06/11/2015  Navigator Encounter Type 3 month  Patient Visit Type Medonc  Treatment Phase Chemo completed-awaiting surgery  Barriers/Navigation Needs No barriers at this time  Interventions Encouraged her to get flu vaccine preop  Support Groups/Services -  Time Spent with Patient 10

## 2015-06-11 NOTE — Progress Notes (Signed)
[facility] ONCOLOGY OFFICE PROGRESS NOTE DATE OF VISIT: 06/11/2015   [name]., FNP [name] [place] [id number]  DIAGNOSIS: recurrent Cancer of sigmoid colon  Breast cancer, right  Need for prophylactic vaccination and inoculation against influenza - Plan: Influenza vac split quadrivalent PF (FLUARIX) injection 0.5 mL  PROBLEM LIST 1. Left breast DCIS diagnosed in 2006 and 2010, status post lumpectomy. 2. pT2N0M0 stage I colon cancer, s/p partial colectomy on 03/09/2014  3. PE in 12/2013 when she was diagnosed with colon cancer.  Oncology History   Cancer of sigmoid colon   Staging form: Colon and Rectum, AJCC 7th Edition     Clinical: Stage I (T2, N0, M0) - Unsigned       Breast cancer   2006 Cancer Diagnosis Left breast DCIS diagnosed in 2006 and 2010, status post lumpectomy.   01/07/2014 Initial Diagnosis Breast cancer   01/18/2015 Mammogram 48m mass in lower outer quadrant of right breast    02/01/2015 Initial Biopsy right breast mass biopsy showed invasive ductal carcinoma    02/01/2015 Receptors her2 ER 90%+, PR 10%+, kI67 10%, HER2 pending     Cancer of sigmoid colon   03/04/2014 Initial Diagnosis Cancer of sigmoid colon   03/09/2014 Pathology Results G1 invasive adenocarcinoma, no lymph-Vascular invasion or Peri-neural invasion: Absent. MMR normal, MSI stable.    03/09/2014 Surgery partial colectomy, negative margins.    11/10/2014 Relapse/Recurrence biopsy confirmed oligo liver recurrence    11/10/2014 Imaging PET showed two small ares of hypermetabolism in the right lobe of the liver, no other distant metastasis.    11/30/2014 Imaging abdomen MRI showed a new enhancing lesion which corresponds to an area of abnormal hyper metabolism on recent PET-CT. Stable enlarged portal caval lymph node.    01/02/2015 Pathology Results Liver biopsy showed metastatic adenocarcinoma, IHC (+) for CK20, CDX-2, (-) for TTF-1 and ER   01/02/2015  Miscellaneous liver biopsy KRAS mutation (+)    02/14/2015 Imaging CT showed:  the small metastatic right hepatic lobe liver lesion is not identified for certain on this examination. No new lesions. Stable small cysts in segment 4 a.    03/05/2015 - 04/16/2015 Chemotherapy CAPEOX (capecitabine 2500 mg twice daily Day 1-14, oxaliplatin 1 30 mg/m on day 1, every 21 days, S/P 3 cycles      CURRENT TREATMENT:  Pending breast and liver surgery    INTERVAL HISTORY:  Veronica FULLENWIDER59y.o. female with a history of breast cancer, colon cancer s/p laparoscopic-assisted partial colectomy (03/09/2014), PE (01/06/2014), and recently diagnosed breast cancer (01/2015),  presents to clinic for follow up.   She is doing very well overall. She has good appetite and energy level. She denies any significant pain, nausea, or other symptoms. She is accompanied to the clinic by her husband. She had her liver MRI done earlier last week, and is waiting to see Dr. BBarry Dienesin 2-3 weeks.  MEDICAL HISTORY: Past Medical History  Diagnosis Date  . Hypertension   . Shortness of breath     sob with exertion. dx. with Pulmonary emboli 4'15 ,tx. Xarelto,Lovenox used for [employer]  . Pulmonary embolism     "blood clot in lungs" -dx. 4'15 with CT Chest.  . Liver lesion   . Cancer     left breast cancer x2  . Breast CA     radiation and surgery-lt.  . Colon cancer     ALLERGIES:  is allergic to tramadol.  MEDICATIONS:  Medication List       This list is accurate as of: 06/11/15  9:07 AM.  Always use your most recent med list.               amLODipine 2.5 MG tablet  Commonly known as:  NORVASC  Take 2.5 mg by mouth daily.     capecitabine 500 MG tablet  Commonly known as:  XELODA  Take 5 tablets (2,500 mg total) by mouth 2 (two) times daily after a meal. Take on days 1-14 of chemotherapy ;   Rest   7  Days.     ferrous sulfate 325 (65 FE) MG tablet  Take 325 mg by mouth 3 (three) times daily with meals.      hydrochlorothiazide 25 MG tablet  Commonly known as:  HYDRODIURIL  Take 25 mg by mouth every morning.     metoprolol 50 MG tablet  Commonly known as:  LOPRESSOR  Take 50 mg by mouth 2 (two) times daily.     ondansetron 8 MG tablet  Commonly known as:  ZOFRAN  Take 1 tablet (8 mg total) by mouth 2 (two) times daily. Start the day after chemo for 2 days. Then take as needed for nausea or vomiting.     potassium chloride SA 20 MEQ tablet  Commonly known as:  K-DUR,KLOR-CON  Take 1 tablet (20 mEq total) by mouth 3 (three) times daily.     XARELTO 20 MG Tabs tablet  Generic drug:  rivaroxaban  TAKE ONE TABLET BY MOUTH EVERY DAY WITH SUPPER        SURGICAL HISTORY:  Past Surgical History  Procedure Laterality Date  . Cesarean section    . Breast surgery      '11- Left breast lumpectomy  . Cholecystectomy      Laparoscopic 20 yrs ago  . Partial colectomy N/A 03/09/2014    Procedure: LAPAROSCOPIC ASSISTED PARTIAL COLECTOMY, splenic flexure mobilization;  Surgeon: Leighton Ruff, MD;  Location: [facility];  Service: General;  Laterality: N/A;    REVIEW OF SYSTEMS:   Constitutional: Denies fevers, chills or abnormal weight loss Eyes: Denies blurriness of vision Ears, nose, mouth, throat, and face: Denies mucositis or sore throat Respiratory: Denies cough, dyspnea or wheezes Cardiovascular: Denies palpitation, chest discomfort or lower extremity swelling Gastrointestinal:  Denies nausea, heartburn or change in bowel habits Skin: Denies abnormal skin rashes Lymphatics: Denies new lymphadenopathy or easy bruising Neurological:Denies numbness, tingling or new weaknesses Behavioral/Psych: Mood is stable, no new changes  All other systems were reviewed with the patient and are negative. Breasts: Breast inspection showed them to be symmetrical with no nipple discharge. Palpation of the breasts and axilla revealed no obvious mass that I could appreciate.   Old surgical scar in the left  breast. (+)  Skin erythema below both breasts   PHYSICAL EXAMINATION: ECOG PERFORMANCE STATUS: 1 Blood pressure 133/67, pulse 71, temperature 98.7 F (37.1 C), temperature source Oral, resp. rate 17, height _0  (1.651 m), weight 281 lb 9.6 oz (127.733 kg), last menstrual period 01/23/2014, SpO2 99 %. GENERAL:alert, no distress and comfortable; well developed and obese. Easily mobile to exam table.  SKIN: skin color, texture, turgor are normal, no rashes or significant lesions EYES: normal, Conjunctiva are pink and non-injected, sclera clear OROPHARYNX:no exudate, no erythema and lips, buccal mucosa, and tongue normal  NECK: supple, thyroid normal size, non-tender, without nodularity LYMPH:  no palpable lymphadenopathy in the cervical, axillary or supraclavicular LUNGS: clear to auscultation with  normal breathing effort, no wheezes or rhonchi HEART: regular rate & rhythm and no murmurs and no lower extremity edema ABDOMEN:abdomen soft, non-tender and normal bowel sounds; incision healing with signs of infection. Rectal exam negative for palpable lesion, no blood on glove  Musculoskeletal:no cyanosis of digits and no clubbing  NEURO: alert & oriented x 3 with fluent speech, no focal motor/sensory deficits Breasts: Breast inspection showed them to be symmetrical with no nipple discharge. Palpation of the breasts and axilla revealed no obvious mass that I could appreciate.   Labs:  CBC Latest Ref Rng 05/14/2015 05/07/2015 04/16/2015  WBC 3.9 - 10.3 10e3/uL 6.5 6.7 6.4  Hemoglobin 11.6 - 15.9 g/dL 13.5 12.7 13.5  Hematocrit 34.8 - 46.6 % 39.6 37.1 39.9  Platelets 145 - 400 10e3/uL 152 156 180    CMP Latest Ref Rng 05/14/2015 05/07/2015 04/16/2015  Glucose 70 - 140 mg/dl 109 89 119  BUN 7.0 - 26.0 mg/dL 9.8 10.8 10.5  Creatinine 0.6 - 1.1 mg/dL 0.9 0.8 0.9  Sodium 136 - 145 mEq/L 140 143 140  Potassium 3.5 - 5.1 mEq/L 3.4(L) 2.9(LL) 3.7  Chloride 96 - 112 mEq/L - - -  CO2 22 - 29 mEq/L _0 Calcium 8.4 - 10.4 mg/dL 9.9 9.4 9.3  Total Protein 6.4 - 8.3 g/dL 8.1 7.6 7.7  Total Bilirubin 0.20 - 1.20 mg/dL 0.77 0.94 1.00  Alkaline Phos 40 - 150 U/L 60 53 48  AST 5 - 34 U/L 39(H) 48(H) 43(H)  ALT 0 - 55 U/L _1 PATHOLOGY RESULT Diagnosis 01/02/2015 Liver, needle/core biopsy, right - POSITIVE FOR METASTATIC ADENOCARCINOMA. - SEE COMMENT. Microscopic Comment Immunohistochemical stains are performed. The tumor is positive for cytokeratin 20 and CDX-2. Cytokeratin 7 stains background bile ducts but is negative within the tumor. The tumor is negative for TTF-1 and estrogen receptor. The morphology coupled with the staining pattern is consistent with metastatic adenocarcinoma of colorectal primary source.   Diagnosis 02/01/2015 Breast, right, needle core biopsy, LOQ - INVASIVE DUCTAL CARCINOMA, SEE COMMENT. - DUCTAL CARCINOMA IN SITU. Microscopic Comment Although the grade of tumor is best assessed at resection, with these biopsies, both the invasive and in situ carcinoma are grade I-II. Breast prognostic studies are pending and will be reported in an addendum. Both the invasive and in situ carcinoma demonstrate strongly diffuse E. cadherin expression; supporting a ductal phenotype. The absence of the myoepithelial layer was confirmed with smooth muscle myosin heavy chain, calponin, and p63 immunostains. The case was reviewed by Dr Avis Epley who concurs.  Results: IMMUNOHISTOCHEMICAL AND MORPHOMETRIC ANALYSIS BY THE AUTOMATED CELLULAR IMAGING SYSTEM (ACIS) Estrogen Receptor: 90%, POSITIVE, STRONG STAINING INTENSITY (PERFORMED MANUALLY) Progesterone Receptor: 10%, POSITIVE, STRONG STAINING INTENSITY (PERFORMED MANUALLY) Proliferation Marker Ki67: 10%  HER2 - NEGATIVE RATIO OF HER2/CEP17 SIGNALS 1.09 AVERAGE HER2 COPY NUMBER PER CELL 1.90  PET 11/10/2014  IMPRESSION: 1. Status post sigmoidectomy with two small areas of hypermetabolism in the right lobe of the  liver, which are concerning for potential metastatic lesions, as above. This could be confirmed with MRI of the abdomen with and without IV gadolinium. 2. There is some relatively diffuse hypermetabolism in the rectum, which is favored to be either physiologic or related to a proctitis. No focal mass like soft tissue is noted adjacent to the suture line to suggest local recurrence of disease. 3. Small focus of ground-glass attenuation in the medial aspect of the right lower lobe, favored to reflect developing fibrosis  related to adjacent large marginal osteophytes in the thoracic spine (reference: AJR Am J Roentgenol. 2002 Oct;179(4):893-6.). There is some low-level hypermetabolism in this region at this time, however, this finding is strongly favored to be benign. Attention at time of follow-up imaging is recommended to ensure the stability or resolution of these findings. 4. Dilatation of the pulmonic trunk (3.9 cm in diameter), suggesting pulmonary arterial hypertension.  Abdomen MRI w wo contrast 05/29/2015  IMPRESSION: 1. Mild degradation secondary to patient body habitus. 2. Isolated segment 6 subcapsular liver lesion is similar. No new or progressive disease identified. 3. Interpolar right renal lesion is favored to represent a complex/septated cyst and is not significantly changed in size.  ASSESSMENT: Veronica Reyes 59 y.o. female with a history of  1.Colon Cancer, Stage I (pT2N0), MMR normal, oligo recurrent disease in liver in 12/2014, KRAS mutation (+) --I discussed the recent abdominal MRI and PET scan  and personally reviewed the scan with her. There are 2 small uptake in the liver, corresponding to a liver lesion with enhancement in segment 7 of MRI, this is highly suspicious for metastatic disease.  -I discussed the liver biopsy result with her in great details, which is consistent with metastatic colon cancer -I reviewed the nature history of metastatic colon cancer,  and the potential curative approach with chemotherapy followed by liver lesion ablation or resection. However, more likely, this is not curable disease. -her tumor has K-ras mutation, so she will not benefit from EGFR targeted therapy -I discussed the option of first-line chemotherapy, including FOLFOX, CAPEOX, with or without Avastin. She does not want a port placement and opted CAPEOX. Capecitabine was approved by her insurance and she has received it.   -Her case was discussed in our GI tumor Board, and the recommendation is systemic chemotherapy followed by surgical resection for the liver lesion  - I reviewed her restaging CT scan from 02/14/2015, interestingly, without any treatment,  the liver lesion got smaller and not well  Visualized in the CT scan. -She is status post 3 cycles of Capeox, does not want to proceed with fourth cycle due to the moderate side effects from previous chemotherapy treatment. I spoke with Dr. [name] and she will likely proceed with surgery. -I discussed her abdominal MRI findings, which showed unchanged subcapsular liver lesion, no new lesions. -She is scheduled to see Dr. [name] in the mid of October, I sent a message to Dr. [name] to see if she can see her earlier. -The patient does not want get more chemotherapy at this point.  2. History of PE (01/06/2014) --She has a diagnosis of pulmonary embolism, likely secondary to malignancy in the setting of family history for stroke (and possible stroke history in patient as well). Lower extremity dopplers negative.  -continue Xarelto.   3. New right breast cancer, T1aN0M0, stage Ia, ER+/PR+/HER2-, History of left Breast Cancer, diagnosed on 02/01/2015 --Left CIS in situ, s/p lumpectomy in 2006 and 2011 by Dr. Margot Chimes, and radiation in 2011 by Dr. Sondra Come. Last mammogram on 01/09/14 negative - I discussed her right breast imaging finding and biopsy results.  Unfortunately she has early-stage right breast cancer now.  - I  discussed the standard treatment option is lumpectomy plus sentinel lymph nodes for her breast cancer,  Followed by adjuvant radiation and endocrine therapy ( antiestrogen therapy).  -Dr. [name] is planning to do her breast and liver surgery at the same time soon. Her surgery was postponed due to neoadjuvant chemotherapy, insurance issue, etc.  4.  Uterine Fibroids  --Negative endometrial biopsy. Following with Gynecology.  5. Hypokalemia -improved, she is still on potassium supplement.   6. Kidney lesion (Right). -No hypermetabolic right kidney lesion on the pet scan -Repeat abdominal MRI 05/29/2015 showed unchanged right renal lesion, favored to represent a complex/septated cyst.  Plan  -She will see Dr. [name] and have breast and liver surgery soon -I will see her back 2 weeks after her surgery. She will call me after her surgery. -Influenza vaccine today     All questions were answered. The patient knows to call the clinic with any problems, questions or concerns. We can certainly see the patient much sooner if necessary.  I spent 25 minutes counseling the patient face to face. The total time spent in the appointment was 30 minutes.   [name]  06/11/2015

## 2015-06-15 ENCOUNTER — Encounter: Payer: Self-pay | Admitting: Hematology

## 2015-06-15 NOTE — Progress Notes (Signed)
Per [facility] they are closing case for xeloda. They were told the patient is no longer taking,. See prev notes.

## 2015-06-28 ENCOUNTER — Other Ambulatory Visit: Payer: Self-pay | Admitting: Hematology

## 2015-06-29 ENCOUNTER — Other Ambulatory Visit: Payer: Self-pay | Admitting: General Surgery

## 2015-06-29 DIAGNOSIS — C787 Secondary malignant neoplasm of liver and intrahepatic bile duct: Principal | ICD-10-CM

## 2015-06-29 DIAGNOSIS — C189 Malignant neoplasm of colon, unspecified: Secondary | ICD-10-CM

## 2015-07-03 ENCOUNTER — Ambulatory Visit
Admission: RE | Admit: 2015-07-03 | Discharge: 2015-07-03 | Disposition: A | Discharge status: Home / Self Care | Payer: 59 | Source: Ambulatory Visit | Attending: General Surgery | Admitting: General Surgery

## 2015-07-03 ENCOUNTER — Other Ambulatory Visit: Payer: Self-pay | Admitting: Hematology

## 2015-07-03 DIAGNOSIS — C189 Malignant neoplasm of colon, unspecified: Secondary | ICD-10-CM

## 2015-07-03 DIAGNOSIS — E876 Hypokalemia: Secondary | ICD-10-CM

## 2015-07-03 DIAGNOSIS — C787 Secondary malignant neoplasm of liver and intrahepatic bile duct: Principal | ICD-10-CM

## 2015-07-03 NOTE — Consult Note (Signed)
Chief Complaint: I have colon cancer, with metastatic disease to liver.   Referring Physician(s): Dr. Stark Klein  History of Present Illness: Veronica Reyes is a 59 y.o. female kindly referred by Dr. [name] for evaluation of her known colorectal carcinoma and oligometastatic disease to liver.    Veronica Reyes had received a diagnosis of primary colon carcinoma after a screening colonoscopy was performed by Dr. Benson Norway 12/22/2013. Initial CT imaging performed 01/06/2014 demonstrated the mass which had been biopsied previously. Veronica. Sabra Reyes subsequently underwent laparoscopic-assisted partial colectomy with splenic flexure mobilization 03/09/2014. Invasive adenocarcinoma was diagnosed on the surgical pathology. 14 lymph nodes negative for tumor. Surgical resection margins negative for tumor. MMR protein immunohistochemistry performed with 4 markers returning greater than 50% tumor expression. (Surgical pathology report 03/09/2014). Staging PET/CT performed 11/10/2014 demonstrates hypermetabolic activity right liver lobe, with confirmation of metastatic disease by percutaneous CT/ultrasound guided biopsy 01/02/2015. Chemotherapy initiated 03/05/2015 with CAPEOX therapy.   Of interest, the patient is also a survivor of a prior left-sided breast carcinoma diagnosed 2006 and then again 2010, status post lumpectomy. New additional diagnosis of a right-sided breast carcinoma with invasive ductal carcinoma 02/01/2015.  Veronica Reyes was previously seen by Dr. [name] for a discussion regarding surgical resection of her oligo metastatic disease. I did talk to Dr. [name] about this consultation, and the patient is hesitant to undergo any surgery. Veronica. Sabra Reyes confides in me that she has a close relative who unfortunately died during a surgery for hepatocellular carcinoma resection. She prefers nonsurgical treatment.  Currently Veronica. Sabra Reyes is relatively asymptomatic, and is able to complete all of her tasks of  daily living.  She is not experiencing any melena or bright red blood per rectum. No hematemesis or nausea.  I had a lengthy discussion regarding natural history of colorectal metastases, including options for minimally invasive therapy.  Specifically, I discussed thermal ablation (microwave ablation) as an alternative to surgery. I did reinforce Dr. Marlowe Aschoff impression, which is that surgical resection remains the gold standard, although excellent outcomes from percutaneous thermal ablation are very well excepted in the medical literature.  Currently, outcomes are very good after ablation, with <10% recurrence rates/progression rates, but high-powered, long-term data is lacking.  My discussion with Veronica. Sabra Reyes was regarding a risk benefit assessment. Specific risks discussed with her included bleeding, infection, injury to adjacent organs, hepatic injury/hepatic shock, need for further procedure/surgery, disease recurrence/progression, post ablation syndrome, cardiopulmonary collapse, death. I also discussed with her need for further monitoring over time after a treatment. After our discussion, she is very much interested in pursuing a minimally invasive approach, as open surgery is what she is hoping to avoid.  Of note, her most recent CEA performed 03/01/2015 is 1.3.    Past Medical History  Diagnosis Date  . Hypertension   . Shortness of breath     sob with exertion. dx. with Pulmonary emboli 4'15 ,tx. Xarelto,Lovenox used for [employer].  . Pulmonary embolism     "blood clot in lungs" -dx. 4'15 with CT Chest.  . Liver lesion   . Cancer     left breast cancer x2  . Breast CA     radiation and surgery-lt.  . Colon cancer     Past Surgical History  Procedure Laterality Date  . Cesarean section    . Breast surgery      '11- Left breast lumpectomy  . Cholecystectomy      Laparoscopic 20 yrs ago  . Partial colectomy N/A 03/09/2014  Procedure: LAPAROSCOPIC ASSISTED PARTIAL COLECTOMY, splenic  flexure mobilization;  Surgeon: Leighton Ruff, MD;  Location: [facility];  Service: General;  Laterality: N/A;    Allergies: Tramadol  Medications: Prior to Admission medications   Medication Sig Start Date End Date Taking? Authorizing Provider  amLODipine (NORVASC) 2.5 MG tablet Take 2.5 mg by mouth daily. 10/23/14  Yes Historical Provider, MD  ferrous sulfate 325 (65 FE) MG tablet Take 325 mg by mouth 3 (three) times daily with meals.    Yes Historical Provider, MD  hydrochlorothiazide (HYDRODIURIL) 25 MG tablet Take 25 mg by mouth every morning.    Yes Historical Provider, MD  metoprolol (LOPRESSOR) 50 MG tablet Take 50 mg by mouth 2 (two) times daily.   Yes Historical Provider, MD  ondansetron (ZOFRAN) 8 MG tablet Take 1 tablet (8 mg total) by mouth 2 (two) times daily. Start the day after chemo for 2 days. Then take as needed for nausea or vomiting. 03/26/15  Yes [name], MD  potassium chloride SA (K-DUR,KLOR-CON) 20 MEQ tablet Take 1 tablet (20 mEq total) by mouth 3 (three) times daily. 06/01/15  Yes [name], MD  XARELTO 20 MG TABS tablet TAKE ONE TABLET BY MOUTH EVERY DAY WITH SUPPER 06/29/15  Yes [name], MD  capecitabine (XELODA) 500 MG tablet Take 5 tablets (2,500 mg total) by mouth 2 (two) times daily after a meal. Take on days 1-14 of chemotherapy ;   Rest   7  Days. Patient not taking: Reported on 05/07/2015 04/10/15   [name], MD     Family History  Problem Relation Age of Onset  . Hypertension Sister   . Cancer Cousin     Social History   Social History  . Marital Status: Married    Spouse Name: N/A  . Number of Children: N/A  . Years of Education: N/A   Social History Main Topics  . Smoking status: Never Smoker   . Smokeless tobacco: Never Used  . Alcohol Use: No  . Drug Use: No  . Sexual Activity: Yes    Birth Control/ Protection: Post-menopausal   Other Topics Concern  . Not on file   Social History Narrative   Married, husband Veronica Reyes    ECOG Status: 0 -  Asymptomatic  Review of Systems: A 12 point ROS discussed and pertinent positives are indicated in the HPI above.  All other systems are negative.  Review of Systems  Vital Signs: BP 147/82 mmHg  Pulse 78  Temp(Src) 97.9 F (36.6 C) (Oral)  Resp 14  Ht '5\' 6"'  (1.676 m)  Wt 278 lb (126.1 kg)  BMI 44.89 kg/m2  SpO2 99%  LMP 01/23/2014  Physical Exam  Mallampati Score:     Imaging: No results found.  Labs:   CBC:  Recent Labs  03/26/15 1023 04/16/15 0945 05/07/15 1034 05/14/15 1109  WBC 7.0 6.4 6.7 6.5  HGB 14.2 13.5 12.7 13.5  HCT 41.5 39.9 37.1 39.6  PLT 246 180 156 152    COAGS:  Recent Labs  01/02/15 0929  INR 1.11  APTT 29    BMP:  Recent Labs  03/26/15 1023 04/16/15 0945 05/07/15 1035 05/14/15 1110  NA 141 140 143 140  K 3.3* 3.7 2.9* 3.4*  CO2 '24 28 24 25  ' GLUCOSE 121 119 89 109  BUN 10.9 10.5 10.8 9.8  CALCIUM 9.9 9.3 9.4 9.9  CREATININE 0.9 0.9 0.8 0.9    LIVER FUNCTION TESTS:  Recent Labs  03/26/15 1023  04/16/15 0945 05/07/15 1035 05/14/15 1110  BILITOT 0.54 1.00 0.94 0.77  AST 47* 43* 48* 39*  ALT 38 '26 24 19  ' ALKPHOS 46 48 53 60  PROT 7.4 7.7 7.6 8.1  ALBUMIN 3.1* 3.2* 3.0* 3.2*    TUMOR MARKERS:  Recent Labs  11/17/14 1347 03/01/15 1121  CEA 1.2 1.3    Assessment and Plan:  Veronica Reyes is a 79 -year-old female with oligo metastatic disease to the liver of a colorectal primary. Her surgical care has been directed by Dr. [name], and her medical oncology care by Dr. Burr Medico.  She is a patient who desires alternative treatment to open surgery for her single metastatic lesion of the right liver lobe, and she is a candidate for thermal ablation.  After our discussion (see above) she wishes to proceed with image guided microwave ablation of her single right liver lobe metastasis with anesthesia. We will plan on scheduling this as soon as possible. She wishes to perform this before the holidays.  Currently she is taking  Xarelto for a pulmonary embolism that was discovered at the time of her colon carcinoma diagnosis, April 2015. We would require her to be withdrawn from this medication 5 days before her treatment. If restarting the Xarelto is intended, this could occur the day after her therapy.  Thank you for this interesting consult.  I greatly enjoyed meeting Veronica Reyes and look forward to participating in their care.  A copy of this report was sent to the requesting provider on this date.  SignedCorrie Mckusick 07/03/2015, 2:36 PM   I spent a total of  40 Minutes   in face to face in clinical consultation, greater than 50% of which was counseling/coordinating care for colorectal carcinoma, oligometastatic disease to the liver, and thermal ablation.

## 2015-07-04 ENCOUNTER — Other Ambulatory Visit: Payer: Self-pay | Admitting: Interventional Radiology

## 2015-07-04 ENCOUNTER — Telehealth: Payer: Self-pay | Admitting: Hematology

## 2015-07-04 DIAGNOSIS — C19 Malignant neoplasm of rectosigmoid junction: Secondary | ICD-10-CM

## 2015-07-04 NOTE — Telephone Encounter (Signed)
per [facility] to sch pt appt-cld pt and pt with pt daughter Yvetta Coder and gave appt time & date

## 2015-07-17 ENCOUNTER — Other Ambulatory Visit: Payer: 59

## 2015-08-01 ENCOUNTER — Other Ambulatory Visit: Payer: Self-pay | Admitting: Hematology

## 2015-08-07 ENCOUNTER — Other Ambulatory Visit: Payer: 59

## 2015-08-07 ENCOUNTER — Encounter: Payer: 59 | Admitting: Hematology

## 2015-08-07 ENCOUNTER — Encounter: Payer: Self-pay | Admitting: Hematology

## 2015-08-07 NOTE — Progress Notes (Signed)
This encounter was created in error - please disregard.

## 2015-08-08 ENCOUNTER — Other Ambulatory Visit: Payer: Self-pay | Admitting: *Deleted

## 2015-08-16 ENCOUNTER — Telehealth: Payer: Self-pay | Admitting: Hematology

## 2015-08-16 NOTE — Telephone Encounter (Signed)
pt cld left voice mail wanting t r/s appt-left messageadv pt to cll back to r/s at a time that will work for he

## 2015-08-21 ENCOUNTER — Other Ambulatory Visit: Payer: Self-pay | Admitting: Radiology

## 2015-08-22 ENCOUNTER — Encounter (HOSPITAL_COMMUNITY)
Admission: RE | Admit: 2015-08-22 | Discharge: 2015-08-22 | Disposition: A | Discharge status: Home / Self Care | Payer: 59 | Source: Ambulatory Visit | Attending: Interventional Radiology | Admitting: Interventional Radiology

## 2015-08-22 ENCOUNTER — Encounter (HOSPITAL_COMMUNITY): Payer: Self-pay

## 2015-08-22 DIAGNOSIS — Z6841 Body Mass Index (BMI) 40.0 and over, adult: Secondary | ICD-10-CM | POA: Diagnosis not present

## 2015-08-22 DIAGNOSIS — C189 Malignant neoplasm of colon, unspecified: Secondary | ICD-10-CM | POA: Diagnosis not present

## 2015-08-22 DIAGNOSIS — Z86711 Personal history of pulmonary embolism: Secondary | ICD-10-CM | POA: Diagnosis not present

## 2015-08-22 DIAGNOSIS — Z7901 Long term (current) use of anticoagulants: Secondary | ICD-10-CM | POA: Diagnosis not present

## 2015-08-22 DIAGNOSIS — Z79899 Other long term (current) drug therapy: Secondary | ICD-10-CM | POA: Diagnosis not present

## 2015-08-22 DIAGNOSIS — Z853 Personal history of malignant neoplasm of breast: Secondary | ICD-10-CM | POA: Diagnosis not present

## 2015-08-22 DIAGNOSIS — Z9049 Acquired absence of other specified parts of digestive tract: Secondary | ICD-10-CM | POA: Diagnosis not present

## 2015-08-22 DIAGNOSIS — C787 Secondary malignant neoplasm of liver and intrahepatic bile duct: Secondary | ICD-10-CM | POA: Diagnosis not present

## 2015-08-22 DIAGNOSIS — I10 Essential (primary) hypertension: Secondary | ICD-10-CM | POA: Diagnosis not present

## 2015-08-22 DIAGNOSIS — Z923 Personal history of irradiation: Secondary | ICD-10-CM | POA: Diagnosis not present

## 2015-08-22 LAB — COMPREHENSIVE METABOLIC PANEL
ALT: 35 U/L (ref 14–54)
AST: 40 U/L (ref 15–41)
Albumin: 3.9 g/dL (ref 3.5–5.0)
Alkaline Phosphatase: 43 U/L (ref 38–126)
Anion gap: 6 (ref 5–15)
BILIRUBIN TOTAL: 1 mg/dL (ref 0.3–1.2)
BUN: 8 mg/dL (ref 6–20)
CO2: 29 mmol/L (ref 22–32)
CREATININE: 0.89 mg/dL (ref 0.44–1.00)
Calcium: 9.8 mg/dL (ref 8.9–10.3)
Chloride: 105 mmol/L (ref 101–111)
Glucose, Bld: 101 mg/dL — ABNORMAL HIGH (ref 65–99)
POTASSIUM: 4.3 mmol/L (ref 3.5–5.1)
Sodium: 140 mmol/L (ref 135–145)
TOTAL PROTEIN: 8.5 g/dL — AB (ref 6.5–8.1)

## 2015-08-22 LAB — CBC WITH DIFFERENTIAL/PLATELET
BASOS ABS: 0 10*3/uL (ref 0.0–0.1)
Basophils Relative: 0 %
Eosinophils Absolute: 0.1 10*3/uL (ref 0.0–0.7)
Eosinophils Relative: 1 %
HEMATOCRIT: 41.8 % (ref 36.0–46.0)
Hemoglobin: 14.1 g/dL (ref 12.0–15.0)
LYMPHS ABS: 3.1 10*3/uL (ref 0.7–4.0)
LYMPHS PCT: 35 %
MCH: 31.8 pg (ref 26.0–34.0)
MCHC: 33.7 g/dL (ref 30.0–36.0)
MCV: 94.1 fL (ref 78.0–100.0)
MONO ABS: 0.6 10*3/uL (ref 0.1–1.0)
Monocytes Relative: 6 %
NEUTROS ABS: 5.1 10*3/uL (ref 1.7–7.7)
Neutrophils Relative %: 58 %
Platelets: 226 10*3/uL (ref 150–400)
RBC: 4.44 MIL/uL (ref 3.87–5.11)
RDW: 13.9 % (ref 11.5–15.5)
WBC: 8.9 10*3/uL (ref 4.0–10.5)

## 2015-08-22 LAB — APTT: APTT: 30 s (ref 24–37)

## 2015-08-22 LAB — PROTIME-INR
INR: 1.16 (ref 0.00–1.49)
PROTHROMBIN TIME: 14.9 s (ref 11.6–15.2)

## 2015-08-22 NOTE — Pre-Procedure Instructions (Addendum)
CT Chest 02-13-15 Epic. EKG done today. Dr. Jenita Seashore reviewed EKG copy and compared to 6'15 EKG with chart.

## 2015-08-22 NOTE — Patient Instructions (Addendum)
Veronica Reyes  08/22/2015   Your procedure is scheduled on:   -12-9 2016 Friday  Enter through [place] and follow signs to [facility] to [place]. Arrive at      0630  AM.  (Limit 1 person with you).  Call this number if you have problems the morning of surgery: [phone]  Or Presurgical Testing [phone] days before.   For Living Will and/or Health Care Power Attorney Forms: please provide copy for your medical record,may bring AM of surgery(Forms should be already notarized -we do not provide this service).(No information preferred today).      Do not eat food/ or drink: After Midnight.    Take these medicines the morning of surgery with A SIP OF WATER-   (DO NOT TAKE ANY DIABETIC MEDS AM OF SURGERY) : Amlodipine. . Metoprolol. Xarelto(per MD use )   Do not wear jewelry, make-up or nail polish.  Do not wear deodorant, lotions, powders, or perfumes.   Do not shave legs and under arms- 48 hours(2 days) prior to first CHG shower.(Shaving face and neck okay.)  Do not bring valuables to the hospital.(Hospital is not responsible for lost valuables).  Contacts, dentures or removable bridgework, body piercing, hair pins may not be worn into surgery.  Leave suitcase in the car. After surgery it may be brought to your room.  For patients admitted to the hospital, checkout time is 11:00 AM the day of discharge.(Restricted visitors-Any Persons displaying flu-like symptoms or illness).    Patients discharged the day of surgery will not be allowed to drive home. Must have responsible person with you x 24 hours once discharged.  Name and phone number of your driver: Veronica Reyes -spouse [phone]      Please read over the following fact sheets that you were given:  CHG(Chlorhexidine Gluconate 4% Surgical Soap) use.  Remember : Type/Screen "Blue armbands" - may not be removed once applied(would result in being retested AM of surgery, if  removed).         [facility] - Preparing for Surgery Before surgery, you can play an important role.  Because skin is not sterile, your skin needs to be as free of germs as possible.  You can reduce the number of germs on your skin by washing with CHG (chlorahexidine gluconate) soap before surgery.  CHG is an antiseptic cleaner which kills germs and bonds with the skin to continue killing germs even after washing. Please DO NOT use if you have an allergy to CHG or antibacterial soaps.  If your skin becomes reddened/irritated stop using the CHG and inform your nurse when you arrive at Short Stay. Do not shave (including legs and underarms) for at least 48 hours prior to the first CHG shower.  You may shave your face/neck. Please follow these instructions carefully:  1.  Shower with CHG Soap the night before surgery and the  morning of Surgery.  2.  If you choose to wash your hair, wash your hair first as usual with your  normal  shampoo.  3.  After you shampoo, rinse your hair and body thoroughly to remove the  shampoo.                           4.  Use CHG as you would any other liquid soap.  You can apply chg directly  to the skin and wash  Gently with a scrungie or clean washcloth.  5.  Apply the CHG Soap to your body ONLY FROM THE NECK DOWN.   Do not use on face/ open                           Wound or open sores. Avoid contact with eyes, ears mouth and genitals (private parts).                       Wash face,  Genitals (private parts) with your normal soap.             6.  Wash thoroughly, paying special attention to the area where your surgery  will be performed.  7.  Thoroughly rinse your body with warm water from the neck down.  8.  DO NOT shower/wash with your normal soap after using and rinsing off  the CHG Soap.                9.  Pat yourself dry with a clean towel.            10.  Wear clean pajamas.            11.  Place clean sheets on your bed the night of  your first shower and do not  sleep with pets. Day of Surgery : Do not apply any lotions/deodorants the morning of surgery.  Please wear clean clothes to the hospital/surgery center.  FAILURE TO FOLLOW THESE INSTRUCTIONS MAY RESULT IN THE CANCELLATION OF YOUR SURGERY PATIENT SIGNATURE_________________________________  NURSE SIGNATURE__________________________________  ________________________________________________________________________

## 2015-08-24 ENCOUNTER — Encounter (HOSPITAL_COMMUNITY)
Admission: RE | Disposition: A | Discharge status: Home / Self Care | Payer: Self-pay | Source: Ambulatory Visit | Attending: Interventional Radiology

## 2015-08-24 ENCOUNTER — Encounter (HOSPITAL_COMMUNITY): Payer: Self-pay | Admitting: *Deleted

## 2015-08-24 ENCOUNTER — Ambulatory Visit (HOSPITAL_COMMUNITY)
Admission: RE | Admit: 2015-08-24 | Discharge: 2015-08-24 | Disposition: A | Discharge status: Home / Self Care | Payer: 59 | Source: Ambulatory Visit | Attending: Interventional Radiology | Admitting: Interventional Radiology

## 2015-08-24 ENCOUNTER — Ambulatory Visit (HOSPITAL_COMMUNITY): Payer: 59 | Admitting: Anesthesiology

## 2015-08-24 ENCOUNTER — Observation Stay (HOSPITAL_COMMUNITY)
Admission: RE | Admit: 2015-08-24 | Discharge: 2015-08-25 | Disposition: A | Discharge status: Home / Self Care | Payer: 59 | Source: Ambulatory Visit | Attending: Interventional Radiology | Admitting: Interventional Radiology

## 2015-08-24 ENCOUNTER — Encounter (HOSPITAL_COMMUNITY): Payer: Self-pay

## 2015-08-24 DIAGNOSIS — Z7901 Long term (current) use of anticoagulants: Secondary | ICD-10-CM | POA: Insufficient documentation

## 2015-08-24 DIAGNOSIS — Z9049 Acquired absence of other specified parts of digestive tract: Secondary | ICD-10-CM | POA: Insufficient documentation

## 2015-08-24 DIAGNOSIS — Z79899 Other long term (current) drug therapy: Secondary | ICD-10-CM | POA: Insufficient documentation

## 2015-08-24 DIAGNOSIS — Z86711 Personal history of pulmonary embolism: Secondary | ICD-10-CM | POA: Insufficient documentation

## 2015-08-24 DIAGNOSIS — C787 Secondary malignant neoplasm of liver and intrahepatic bile duct: Secondary | ICD-10-CM | POA: Diagnosis not present

## 2015-08-24 DIAGNOSIS — C189 Malignant neoplasm of colon, unspecified: Secondary | ICD-10-CM | POA: Insufficient documentation

## 2015-08-24 DIAGNOSIS — Z923 Personal history of irradiation: Secondary | ICD-10-CM | POA: Insufficient documentation

## 2015-08-24 DIAGNOSIS — Z853 Personal history of malignant neoplasm of breast: Secondary | ICD-10-CM | POA: Insufficient documentation

## 2015-08-24 DIAGNOSIS — K769 Liver disease, unspecified: Secondary | ICD-10-CM | POA: Diagnosis present

## 2015-08-24 DIAGNOSIS — I10 Essential (primary) hypertension: Secondary | ICD-10-CM | POA: Insufficient documentation

## 2015-08-24 DIAGNOSIS — C19 Malignant neoplasm of rectosigmoid junction: Secondary | ICD-10-CM

## 2015-08-24 DIAGNOSIS — Z6841 Body Mass Index (BMI) 40.0 and over, adult: Secondary | ICD-10-CM | POA: Insufficient documentation

## 2015-08-24 HISTORY — PX: RADIOFREQUENCY ABLATION: SHX2290

## 2015-08-24 LAB — PROTIME-INR
INR: 1.07 (ref 0.00–1.49)
PROTHROMBIN TIME: 14.1 s (ref 11.6–15.2)

## 2015-08-24 SURGERY — RADIO FREQUENCY ABLATION
Anesthesia: General | Laterality: Right

## 2015-08-24 MED ORDER — MENTHOL 3 MG MT LOZG
1.0000 | LOZENGE | OROMUCOSAL | Status: DC | PRN
  Filled 2015-08-24: qty 9

## 2015-08-24 MED ORDER — LACTATED RINGERS IV SOLN: INTRAVENOUS | Status: DC

## 2015-08-24 MED ORDER — MIDAZOLAM HCL 5 MG/5ML IJ SOLN
INTRAMUSCULAR | Status: DC | PRN
  Administered 2015-08-24: 2 mg via INTRAVENOUS

## 2015-08-24 MED ORDER — ONDANSETRON HCL 4 MG/2ML IJ SOLN
INTRAMUSCULAR | Status: DC | PRN
  Administered 2015-08-24: 4 mg via INTRAVENOUS

## 2015-08-24 MED ORDER — LACTATED RINGERS IV SOLN
Freq: Once | INTRAVENOUS | Status: AC
  Administered 2015-08-24 (×2): via INTRAVENOUS

## 2015-08-24 MED ORDER — DOCUSATE SODIUM 100 MG PO CAPS
100.0000 mg | ORAL_CAPSULE | Freq: Two times a day (BID) | ORAL | Status: DC
  Administered 2015-08-24 – 2015-08-25 (×2): 100 mg via ORAL
  Filled 2015-08-24 (×4): qty 1

## 2015-08-24 MED ORDER — CEFAZOLIN SODIUM-DEXTROSE 2-3 GM-% IV SOLR
INTRAVENOUS | Status: AC
  Filled 2015-08-24: qty 50

## 2015-08-24 MED ORDER — LIDOCAINE HCL (CARDIAC) 20 MG/ML IV SOLN
INTRAVENOUS | Status: DC | PRN
  Administered 2015-08-24: 100 mg via INTRAVENOUS

## 2015-08-24 MED ORDER — SUGAMMADEX SODIUM 200 MG/2ML IV SOLN
INTRAVENOUS | Status: AC
  Filled 2015-08-24: qty 2

## 2015-08-24 MED ORDER — SUGAMMADEX SODIUM 200 MG/2ML IV SOLN
INTRAVENOUS | Status: DC | PRN
  Administered 2015-08-24: 400 mg via INTRAVENOUS

## 2015-08-24 MED ORDER — PROPOFOL 10 MG/ML IV BOLUS
INTRAVENOUS | Status: DC | PRN
  Administered 2015-08-24: 200 mg via INTRAVENOUS

## 2015-08-24 MED ORDER — HYDROCODONE-ACETAMINOPHEN 5-325 MG PO TABS
1.0000 | ORAL_TABLET | ORAL | Status: DC | PRN
  Administered 2015-08-24 – 2015-08-25 (×2): 1 via ORAL
  Filled 2015-08-24 (×2): qty 1

## 2015-08-24 MED ORDER — FENTANYL CITRATE (PF) 100 MCG/2ML IJ SOLN
INTRAMUSCULAR | Status: DC | PRN
  Administered 2015-08-24: 50 ug via INTRAVENOUS

## 2015-08-24 MED ORDER — HYDROMORPHONE HCL 1 MG/ML IJ SOLN
0.2500 mg | INTRAMUSCULAR | Status: DC | PRN
  Administered 2015-08-24 (×2): 0.5 mg via INTRAVENOUS

## 2015-08-24 MED ORDER — FENTANYL CITRATE (PF) 250 MCG/5ML IJ SOLN
INTRAMUSCULAR | Status: AC
  Filled 2015-08-24: qty 5

## 2015-08-24 MED ORDER — ONDANSETRON HCL 4 MG/2ML IJ SOLN: 4.0000 mg | Freq: Four times a day (QID) | INTRAMUSCULAR | Status: DC | PRN

## 2015-08-24 MED ORDER — MIDAZOLAM HCL 2 MG/2ML IJ SOLN
INTRAMUSCULAR | Status: AC
  Filled 2015-08-24: qty 2

## 2015-08-24 MED ORDER — SENNOSIDES-DOCUSATE SODIUM 8.6-50 MG PO TABS: 1.0000 | ORAL_TABLET | Freq: Every day | ORAL | Status: DC | PRN

## 2015-08-24 MED ORDER — DEXAMETHASONE SODIUM PHOSPHATE 10 MG/ML IJ SOLN
INTRAMUSCULAR | Status: DC | PRN
  Administered 2015-08-24: 10 mg via INTRAVENOUS

## 2015-08-24 MED ORDER — SUCCINYLCHOLINE CHLORIDE 20 MG/ML IJ SOLN
INTRAMUSCULAR | Status: DC | PRN
  Administered 2015-08-24: 100 mg via INTRAVENOUS

## 2015-08-24 MED ORDER — ROCURONIUM BROMIDE 100 MG/10ML IV SOLN
INTRAVENOUS | Status: DC | PRN
  Administered 2015-08-24: 20 mg via INTRAVENOUS
  Administered 2015-08-24: 40 mg via INTRAVENOUS

## 2015-08-24 MED ORDER — PIPERACILLIN-TAZOBACTAM 3.375 G IVPB
3.3750 g | Freq: Once | INTRAVENOUS | Status: AC
  Administered 2015-08-24: 3.375 g via INTRAVENOUS
  Filled 2015-08-24: qty 50

## 2015-08-24 MED ORDER — CEFAZOLIN SODIUM-DEXTROSE 2-3 GM-% IV SOLR
2.0000 g | INTRAVENOUS | Status: DC
  Filled 2015-08-24: qty 50

## 2015-08-24 MED ORDER — SUGAMMADEX SODIUM 500 MG/5ML IV SOLN
INTRAVENOUS | Status: AC
  Filled 2015-08-24: qty 5

## 2015-08-24 MED ORDER — HYDROMORPHONE HCL 1 MG/ML IJ SOLN
INTRAMUSCULAR | Status: AC
  Filled 2015-08-24: qty 1

## 2015-08-24 MED ORDER — SODIUM CHLORIDE 0.9 % IV SOLN
INTRAVENOUS | Status: AC
  Administered 2015-08-24 (×2): via INTRAVENOUS

## 2015-08-24 MED ORDER — METOPROLOL TARTRATE 50 MG PO TABS
50.0000 mg | ORAL_TABLET | Freq: Once | ORAL | Status: AC
  Administered 2015-08-24: 50 mg via ORAL
  Filled 2015-08-24: qty 1

## 2015-08-24 NOTE — Anesthesia Preprocedure Evaluation (Addendum)
Anesthesia Evaluation  Patient identified by MRN, date of birth, ID band Patient awake    Reviewed: Allergy & Precautions, H&P , NPO status , Patient's Chart, lab work & pertinent test results, reviewed documented beta blocker date and time   Airway Mallampati: II  TM Distance: >3 FB Neck ROM: Full    Dental  (+) Dental Advisory Given, Poor Dentition, Missing Many, many missing front teeth.  Only one left on top.:   Pulmonary shortness of breath and with exertion, PE   Pulmonary exam normal breath sounds clear to auscultation       Cardiovascular Exercise Tolerance: Poor hypertension, Pt. on medications and Pt. on home beta blockers Normal cardiovascular exam Rhythm:Regular Rate:Normal     Neuro/Psych negative neurological ROS  negative psych ROS   GI/Hepatic negative GI ROS, Neg liver ROS,   Endo/Other  Morbid obesity  Renal/GU negative Renal ROS     Musculoskeletal negative musculoskeletal ROS (+)   Abdominal (+) + obese,   Peds  Hematology negative hematology ROS (+)   Anesthesia Other Findings Breast and colon cancer  Reproductive/Obstetrics negative OB ROS                            Anesthesia Physical Anesthesia Plan  ASA: III  Anesthesia Plan: General   Post-op Pain Management:    Induction: Intravenous  Airway Management Planned: Oral ETT  Additional Equipment:   Intra-op Plan:   Post-operative Plan: Extubation in OR  Informed Consent:   Plan Discussed with: Surgeon  Anesthesia Plan Comments:         Anesthesia Quick Evaluation

## 2015-08-24 NOTE — Anesthesia Procedure Notes (Signed)
Procedure Name: Intubation Date/Time: 08/24/2015 8:45 AM Performed by: Lind Covert Pre-anesthesia Checklist: Patient identified, Emergency Drugs available, Suction available, Patient being monitored and Timeout performed Patient Re-evaluated:Patient Re-evaluated prior to inductionOxygen Delivery Method: Circle system utilized Preoxygenation: Pre-oxygenation with 100% oxygen Intubation Type: IV induction Laryngoscope Size: Mac and 4 Tube type: Oral Tube size: 7.0 mm Number of attempts: 1 Airway Equipment and Method: Stylet Placement Confirmation: ETT inserted through vocal cords under direct vision,  positive ETCO2 and breath sounds checked- equal and bilateral Secured at: 22 cm Tube secured with: Tape Dental Injury: Teeth and Oropharynx as per pre-operative assessment

## 2015-08-24 NOTE — Procedures (Signed)
Interventional Radiology Procedure Note  Procedure: Image guided microwave ablation of right liver lobe met.  CRC.  Single 17cm PR Neuwave probe.  60mnutes total @ 65W.   Complications: None Recommendations:  - Observe overnight 24 hour obs - Local routine wound care.  - PRN analgesics and anti-nausea for overnight care. - IV hydration - First C+ CT in 3 months, with Q 3 month CT anticipate for immediate 167most-op period.  - VIR clinic with Dr. WaEarleen Newportn 2-4 weeks post ablation.   Signed,  JaDulcy FannyWaEarleen NewportDO

## 2015-08-24 NOTE — Anesthesia Postprocedure Evaluation (Signed)
Anesthesia Post Note  Patient: Veronica Reyes  Procedure(s) Performed: Procedure(s) (LRB): MICROWAVE ABLATION RIGHT LIVER LOBE  (Right)  Patient location during evaluation: PACU Anesthesia Type: General Level of consciousness: awake and alert Pain management: pain level controlled Vital Signs Assessment: post-procedure vital signs reviewed and stable Respiratory status: spontaneous breathing, nonlabored ventilation, respiratory function stable and patient connected to nasal cannula oxygen Cardiovascular status: blood pressure returned to baseline and stable Postop Assessment: no signs of nausea or vomiting Anesthetic complications: no    Last Vitals:  Filed Vitals:   08/24/15 1204 08/24/15 1312  BP: 150/68 167/86  Pulse: 67 64  Temp: 37 C 37.1 C  Resp: 15 16    Last Pain:  Filed Vitals:   08/24/15 1313  PainSc: Asleep                 Addison Freimuth L

## 2015-08-24 NOTE — H&P (Signed)
Referring Physician(s): Dr. [name]  History of Present Illness: Veronica Reyes is a 59 y.o. female with colon cancer s/p colon resection 2015. Evidence of metastatic disease to the right lobe of liver s/p liver lesion biopsy on 12/2014. The patient has been seen by Dr. [name] and would like to avoid surgical intervention. She has been seen in full consult with Dr. Earleen Newport on 07/03/15 and scheduled today for image guided thermal ablation of right lobe liver lesion. She denies any abdominal pain, nausea or vomiting. She states her last BM was yesterday and normal for her. She denies any chest pain, shortness of breath or palpitations. She denies any active signs of bleeding or excessive bruising. She denies any recent infections, fever or chills. She denies any urinary symptoms. The patient denies any history of sleep apnea or chronic oxygen use. She has previously tolerated anesthesia without complications.    Past Medical History  Diagnosis Date  . Hypertension   . Shortness of breath     sob with exertion. dx. with Pulmonary emboli 4'15 ,tx. Xarelto,Lovenox used for [employer].  . Pulmonary embolism ([place])     "blood clot in lungs" -dx. 4'15 with CT Chest.  . Liver lesion   . Cancer [facility])     left breast cancer x2  . Breast CA ([place])     radiation and surgery-lt.  . Colon cancer [facility])     Dr. Burr Medico seeing monthly    Past Surgical History  Procedure Laterality Date  . Cesarean section    . Breast surgery      '11- Left breast lumpectomy  . Cholecystectomy      Laparoscopic 20 yrs ago  . Partial colectomy N/A 03/09/2014    Procedure: LAPAROSCOPIC ASSISTED PARTIAL COLECTOMY, splenic flexure mobilization;  Surgeon: Leighton Ruff, MD;  Location: [facility] ORS;  Service: General;  Laterality: N/A;    Allergies: Tramadol  Medications: Prior to Admission medications   Medication Sig Start Date End Date Taking? Authorizing Provider  amLODipine (NORVASC) 2.5 MG tablet Take 2.5 mg by mouth  daily. 10/23/14   Historical Provider, MD  ferrous sulfate 325 (65 FE) MG tablet Take 325 mg by mouth daily with breakfast.     Historical Provider, MD  hydrochlorothiazide (HYDRODIURIL) 25 MG tablet Take 25 mg by mouth every morning.     Historical Provider, MD  metoprolol (LOPRESSOR) 50 MG tablet Take 50 mg by mouth 2 (two) times daily.    Historical Provider, MD  potassium chloride SA (K-DUR,KLOR-CON) 20 MEQ tablet TAKE 1 TABLET BY MOUTH 3 TIMES DAILY 07/03/15   [name], MD  XARELTO 20 MG TABS tablet TAKE ONE TABLET BY MOUTH EVERY DAY WITH SUPPER 06/29/15   [name], MD     Family History  Problem Relation Age of Onset  . Hypertension Sister   . Cancer Cousin     Social History   Social History  . Marital Status: Married    Spouse Name: N/A  . Number of Children: N/A  . Years of Education: N/A   Social History Main Topics  . Smoking status: Never Smoker   . Smokeless tobacco: Never Used  . Alcohol Use: No  . Drug Use: No  . Sexual Activity: Yes    Birth Control/ Protection: Post-menopausal   Other Topics Concern  . None   Social History Narrative   Married, husband Tyrone Nine    Review of Systems: A 12 point ROS discussed and pertinent positives are indicated in the  HPI above.  All other systems are negative.  Review of Systems  Vital Signs: LMP 01/23/2014 T: 97.9 F, HR: 67 bpm, BP: 144/91 mmHg, O2: 97% RA  Physical Exam  Constitutional: She is oriented to person, place, and time. No distress.  HENT:  Head: Normocephalic and atraumatic.  Cardiovascular: Normal rate and regular rhythm.  Exam reveals no gallop and no friction rub.   No murmur heard. Pulmonary/Chest: Breath sounds normal. No respiratory distress. She has no wheezes. She has no rales.  Abdominal: Soft. Bowel sounds are normal. She exhibits no distension. There is no tenderness.  Neurological: She is alert and oriented to person, place, and time.  Skin: Skin is warm and dry. She is not diaphoretic.     Mallampati Score:  MD Evaluation Airway: WNL Heart: WNL Abdomen: WNL Chest/ Lungs: WNL ASA  Classification: 3 Mallampati/Airway Score: Two  Imaging: No results found.  Labs:  CBC:  Recent Labs  04/16/15 0945 05/07/15 1034 05/14/15 1109 08/22/15 1445  WBC 6.4 6.7 6.5 8.9  HGB 13.5 12.7 13.5 14.1  HCT 39.9 37.1 39.6 41.8  PLT 180 156 152 226    COAGS:  Recent Labs  01/02/15 0929 08/22/15 1445 08/24/15 0720  INR 1.11 1.16 1.07  APTT 29 30  --     BMP:  Recent Labs  04/16/15 0945 05/07/15 1035 05/14/15 1110 08/22/15 1445  NA 140 143 140 140  K 3.7 2.9* 3.4* 4.3  CL  --   --   --  105  CO2 28 24 25 29   GLUCOSE 119 89 109 101*  BUN 10.5 10.8 9.8 8  CALCIUM 9.3 9.4 9.9 9.8  CREATININE 0.9 0.8 0.9 0.89  GFRNONAA  --   --   --  >60  GFRAA  --   --   --  >60    LIVER FUNCTION TESTS:  Recent Labs  04/16/15 0945 05/07/15 1035 05/14/15 1110 08/22/15 1445  BILITOT 1.00 0.94 0.77 1.0  AST 43* 48* 39* 40  ALT 26 24 19  35  ALKPHOS 48 53 60 43  PROT 7.7 7.6 8.1 8.5*  ALBUMIN 3.2* 3.0* 3.2* 3.9    TUMOR MARKERS:  Recent Labs  11/17/14 1347 03/01/15 1121  CEA 1.2 1.3    Assessment and Plan: Colon cancer s/p colon resection 2015 Metastatic disease to the liver, patient would like to avoid surgical intervention- seen by Dr. [name]  Seen in full consult with Dr. Earleen Newport on 07/03/15 Scheduled today for image guided thermal ablation of right lobe liver lesion with general anesthesia The patient has been NPO, no blood thinners taken- Xarelto last taken 08/18/15, labs and vitals have been reviewed. Risks and Benefits discussed with the patient including, but not limited to bleeding, infection, liver failure, bile duct injury, pneumothorax or damage to adjacent structures. All of the patient's questions were answered, patient is agreeable to proceed. Consent signed and in chart. History of Pulmonary embolism 12/2013- Xarelto held History of breast  cancer   Signed: Hedy Jacob 08/24/2015, 8:42 AM

## 2015-08-24 NOTE — Transfer of Care (Signed)
Immediate Anesthesia Transfer of Care Note  Patient: Veronica Reyes  Procedure(s) Performed: Procedure(s): MICROWAVE ABLATION RIGHT LIVER LOBE  (Right)  Patient Location: PACU  Anesthesia Type:General  Level of Consciousness: awake, sedated and patient cooperative  Airway & Oxygen Therapy: Patient Spontanous Breathing and Patient connected to face mask oxygen  Post-op Assessment: Report given to RN, Post -op Vital signs reviewed and stable and Patient moving all extremities X 4  Post vital signs: Reviewed and stable  Last Vitals:  Filed Vitals:   08/24/15 0644  BP: 144/91  Pulse: 67  Temp: 36.6 C  Resp: 18    Complications: No apparent anesthesia complications

## 2015-08-24 NOTE — Progress Notes (Signed)
Subjective: Patient resting post procedure today. She has tolerated food without nausea or vomiting. She denies any chest pain or abdominal pain.   Allergies: Tramadol - dizziness  Medications: Prior to Admission medications   Medication Sig Start Date End Date Taking? Authorizing Provider  amLODipine (NORVASC) 2.5 MG tablet Take 2.5 mg by mouth daily. 10/23/14  Yes Historical Provider, MD  ferrous sulfate 325 (65 FE) MG tablet Take 325 mg by mouth daily with breakfast.    Yes Historical Provider, MD  hydrochlorothiazide (HYDRODIURIL) 25 MG tablet Take 25 mg by mouth every morning.    Yes Historical Provider, MD  metoprolol (LOPRESSOR) 50 MG tablet Take 50 mg by mouth 2 (two) times daily.   Yes Historical Provider, MD  potassium chloride SA (K-DUR,KLOR-CON) 20 MEQ tablet TAKE 1 TABLET BY MOUTH 3 TIMES DAILY 07/03/15  Yes [name], MD  XARELTO 20 MG TABS tablet TAKE ONE TABLET BY MOUTH EVERY DAY WITH SUPPER 06/29/15  Yes [name], MD   Vital Signs: BP 167/83 mmHg  Pulse 65  Temp(Src) 97.9 F (36.6 C) (Oral)  Resp 16  Ht _0  (1.676 m)  Wt 277 lb (125.646 kg)  BMI 44.73 kg/m2  SpO2 91%  LMP 01/23/2014  Physical Exam General: A&Ox3, NAD, resting Abd: Soft, NT, ND, Right flank procedure site dressing C/D/I- NT, no signs of bleeding or hematoma   Imaging: Ct Guide Tissue Ablation  08/24/2015  CLINICAL DATA:  59 year old female with a history of colorectal carcinoma, with oligometastatic disease to the liver. Core biopsy confirmed metastasis 01/02/2015. The patient prefers no open surgery, and she presents today for image guided percutaneous ablation of single right-sided liver lesion. EXAM: CT GUIDED ABLATION ANESTHESIA/SEDATION: Anesthesia team was present for assistance with general endotracheal anesthesia. MEDICATIONS: Preoperative dosing with Zosyn IV CONTRAST:  None FLUOROSCOPY TIME:  CT performed DLP = 2494 mGy*cm PROCEDURE: The procedure, risks, benefits, and alternatives were  explained to the patient. Questions regarding the procedure were encouraged and answered. The patient understands and consents to the procedure. Patient positioned in the right anterior oblique position on the CT gantry table after induction of general anesthesia it with the anesthesia team. Scout CT of the abdomen performed for planning purposes. Ultrasound survey of the right upper quadrant was performed for planning purposes. The right upper quadrant was prepped with Betadine in a sterile fashion, and a sterile drape was applied covering the operative field. A sterile gown and sterile gloves were used for the procedure. Once the patient is prepped and draped in the usual sterile fashion, ultrasound guidance was used to advance a finder Chiba needle into the lesion of the right liver lobe. CT was performed of the target lesion. A small skin incision was then made with 11 blade scalpel, and the selected Neuwave microwave probe (PR probe, 17cm) was advanced under ultrasound guidance adjacent to the finder needle, bisecting the lesion in the right liver lobe. CT was again performed after removing the Chiba needle. Once the microwave probe was confirmed to be within position through the lesion, ablation was initiated. The first ablation session was 5 minutes, 65 watts. Repeat CT was performed. A final 2 minutes session of ablation at 65 watts was performed. Repeat CT was performed. After review of the CT, the needle was withdrawn using the cautery node. A sterile dressing was then placed. Final CT was performed after removal of the needle. Patient was transported to post anesthesia unit for care having tolerated the procedure well. No  complications encountered.  No significant blood loss. Patient tolerated the procedure well and remained hemodynamically stable throughout. No complications were encountered and no significant blood loss was encountered COMPLICATIONS: None FINDINGS: Scout CT demonstrates hypodense liver  lesion within the right lobe of the liver. Images during the case demonstrate ultrasound placement of 17 cm microwave ablation probe, with the tip projecting just distal to the hypodense liver lesion. Final CT image demonstrates ablation zone measuring 4.5 cm along the long axis of the probe with a transverse diameter of at least 2.5 cm. No complicating features. IMPRESSION: Status post image guided microwave ablation of solitary metastasis in the right liver lobe in this patient with oligo metastatic colorectal cancer. Signed, Dulcy Fanny. Earleen Newport, DO [facility] Radiology Electronically Signed   By: Corrie Mckusick D.O.   On: 08/24/2015 13:26    Labs:  CBC:  Recent Labs  04/16/15 0945 05/07/15 1034 05/14/15 1109 08/22/15 1445  WBC 6.4 6.7 6.5 8.9  HGB 13.5 12.7 13.5 14.1  HCT 39.9 37.1 39.6 41.8  PLT 180 156 152 226    COAGS:  Recent Labs  01/02/15 0929 08/22/15 1445 08/24/15 0720  INR 1.11 1.16 1.07  APTT 29 30  --     BMP:  Recent Labs  04/16/15 0945 05/07/15 1035 05/14/15 1110 08/22/15 1445  NA 140 143 140 140  K 3.7 2.9* 3.4* 4.3  CL  --   --   --  105  CO2 _0 GLUCOSE 119 89 109 101*  BUN 10.5 10.8 9.8 8  CALCIUM 9.3 9.4 9.9 9.8  CREATININE 0.9 0.8 0.9 0.89  GFRNONAA  --   --   --  >60  GFRAA  --   --   --  >60    LIVER FUNCTION TESTS:  Recent Labs  04/16/15 0945 05/07/15 1035 05/14/15 1110 08/22/15 1445  BILITOT 1.00 0.94 0.77 1.0  AST 43* 48* 39* 40  ALT _1 35  ALKPHOS 48 53 60 43  PROT 7.7 7.6 8.1 8.5*  ALBUMIN 3.2* 3.0* 3.2* 3.9    Assessment and Plan: 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

## 2015-08-25 ENCOUNTER — Other Ambulatory Visit: Payer: Self-pay | Admitting: Radiology

## 2015-08-25 ENCOUNTER — Encounter (HOSPITAL_COMMUNITY): Payer: Self-pay | Admitting: *Deleted

## 2015-08-25 DIAGNOSIS — C787 Secondary malignant neoplasm of liver and intrahepatic bile duct: Secondary | ICD-10-CM | POA: Diagnosis not present

## 2015-08-25 DIAGNOSIS — K769 Liver disease, unspecified: Secondary | ICD-10-CM

## 2015-08-25 DIAGNOSIS — C19 Malignant neoplasm of rectosigmoid junction: Secondary | ICD-10-CM

## 2015-08-25 MED ORDER — DOCUSATE SODIUM 100 MG PO CAPS: 100.0000 mg | ORAL_CAPSULE | Freq: Two times a day (BID) | ORAL | Status: DC

## 2015-08-25 MED ORDER — HYDROCODONE-ACETAMINOPHEN 5-325 MG PO TABS: 1.0000 | ORAL_TABLET | ORAL | Status: DC | PRN

## 2015-08-25 NOTE — Discharge Instructions (Signed)
Radiofrequency Ablation of Liver Tumors, Care After Refer to this sheet in the next few weeks. These instructions provide you with information on caring for yourself after your procedure. Your health care provider may also give you more specific instructions. Your treatment has been planned according to current medical practices, but problems sometimes occur. Call your health care provider if you have any problems or questions after your procedure.  WHAT TO EXPECT AFTER THE PROCEDURE After your procedure, you may have pain and discomfort in the upper abdomen. You will be given pain medicines to control this.  HOME CARE INSTRUCTIONS  Take all medicines as directed by your health care provider.  Follow diet instructions as directed by your health care provider.  Follow instructions regarding rest and physical activity. SEEK MEDICAL CARE IF:  You cannot pass gas.   You cannot have a bowel movement within 2 days.   You have a skin rash.   You have a fever. SEEK IMMEDIATE MEDICAL CARE IF:  You have severe or lasting abdominal pain or pain in your shoulder or back.   You have trouble swallowing or breathing.   You have severe weakness or dizziness.   You have chest pain or shortness of breath.   This information is not intended to replace advice given to you by your health care provider. Make sure you discuss any questions you have with your health care provider.   Document Released: 06/22/2013 Document Revised: 09/06/2013 Document Reviewed: 06/22/2013 Elsevier Interactive Patient Education [employer].

## 2015-08-25 NOTE — Discharge Summary (Signed)
Patient ID: Veronica Reyes MRN: [id number] DOB/AGE: Jan 15, 1956 59 y.o.  Admit date: 08/24/2015 Discharge date: 08/25/2015  Admission Diagnoses:  Principal Problem:   Hepatic lesion Active Problems:   Metastatic colorectal cancer [facility])  Discharge Diagnoses:  Principal Problem:   Hepatic lesion Active Problems:   Metastatic colorectal cancer [facility])   Discharged Condition: good  Hospital Course:  59 yo female with metastatic colon cancer and liver lesion. Underwent successful Microwave ablation on 12/9 with no immediate complications. Admitted overnight for observation. No overnight issues. Feels well, no pain. Tolerating regular diet. Voiding well on own. Stable for discharge All instructions, medications, and follow up plans reviewed.  Consults: None  Treatments:  CT guided microwave thermal ablation of hepatic lesion 12/9  Discharge Exam: Blood pressure 134/79, pulse 87, temperature 98.3 F (36.8 C), temperature source Oral, resp. rate 18, height 5\' 6"  (1.676 m), weight 277 lb (125.646 kg), last menstrual period 01/23/2014, SpO2 95 %. General: NAD Lungs: CTA without w/r/r Heart: Regular Abdomen: R flank puncture site, clean, no hematoma, no thermal burn   Disposition: 06-Home-Health Care Svc  Discharge Instructions    Call MD for:  difficulty breathing, headache or visual disturbances    Complete by:  As directed      Call MD for:  persistant nausea and vomiting    Complete by:  As directed      Call MD for:  redness, tenderness, or signs of infection (pain, swelling, redness, odor or green/yellow discharge around incision site)    Complete by:  As directed      Call MD for:  severe uncontrolled pain    Complete by:  As directed      Call MD for:  temperature >100.4    Complete by:  As directed      Diet - low sodium heart healthy    Complete by:  As directed      Increase activity slowly    Complete by:  As directed      May shower / Bathe    Complete  by:  As directed      May walk up steps    Complete by:  As directed      No wound care    Complete by:  As directed             Medication List    TAKE these medications        amLODipine 2.5 MG tablet  Commonly known as:  NORVASC  Take 2.5 mg by mouth daily.     docusate sodium 100 MG capsule  Commonly known as:  COLACE  Take 1 capsule (100 mg total) by mouth 2 (two) times daily.     ferrous sulfate 325 (65 FE) MG tablet  Take 325 mg by mouth daily with breakfast.     hydrochlorothiazide 25 MG tablet  Commonly known as:  HYDRODIURIL  Take 25 mg by mouth every morning.     HYDROcodone-acetaminophen 5-325 MG tablet  Commonly known as:  NORCO/VICODIN  Take 1-2 tablets by mouth every 4 (four) hours as needed for moderate pain.     metoprolol 50 MG tablet  Commonly known as:  LOPRESSOR  Take 50 mg by mouth 2 (two) times daily.     potassium chloride SA 20 MEQ tablet  Commonly known as:  K-DUR,KLOR-CON  TAKE 1 TABLET BY MOUTH 3 TIMES DAILY     XARELTO 20 MG Tabs tablet  Generic drug:  rivaroxaban  TAKE  ONE TABLET BY MOUTH EVERY DAY WITH SUPPER           Follow-up Information    Follow up with WAGNER, JAIME, DO. Go in 3 weeks.   Specialty:  Interventional Radiology   Why:  Post procedure eval. Clinic will clal with appointment date/time   Contact information:   [address] [place] [address] [id number]        Signed: Ascencion Dike 08/25/2015, 8:27 AM   I have spent Less Than 30 Minutes discharging Sharolyn Douglas.

## 2015-08-25 NOTE — Progress Notes (Signed)
Assessment unchanged. Pt verbalized understanding of dc instructions through teach back including follow up care and when to call the doctor. Script x 2 given to pt as provided by MD. Discharged via wc to front entrance to meet awaiting vehicle to carry home. Accompanied by husband and NT.

## 2015-08-27 ENCOUNTER — Encounter (HOSPITAL_COMMUNITY): Payer: Self-pay | Admitting: Interventional Radiology

## 2015-08-27 LAB — TYPE AND SCREEN
ABO/RH(D): A POS
Antibody Screen: NEGATIVE

## 2015-08-30 ENCOUNTER — Other Ambulatory Visit: Payer: Self-pay | Admitting: Hematology

## 2015-08-30 NOTE — Telephone Encounter (Signed)
Called patient and left message that we were decreasing her dosage to one daily.

## 2015-09-06 ENCOUNTER — Telehealth: Payer: Self-pay | Admitting: *Deleted

## 2015-09-06 ENCOUNTER — Other Ambulatory Visit: Payer: Self-pay | Admitting: Hematology

## 2015-09-06 MED ORDER — HYDROCODONE-ACETAMINOPHEN 5-325 MG PO TABS: 1.0000 | ORAL_TABLET | Freq: Four times a day (QID) | ORAL | Status: DC | PRN

## 2015-09-06 NOTE — Telephone Encounter (Signed)
Called pt & informed that script would be at front desk for her to pick up today before 4 pm.  Requested information about her breast cancer & what was going on now.  She states that she will have new insurance first of year & will contact Dr Marlowe Aschoff office for f/u.  Informed that Dr Burr Medico would like to see her in 3-4 weeks & to expect a call from our schedulers.  Information given to Dr Burr Medico.

## 2015-09-06 NOTE — Telephone Encounter (Signed)
Patient called "requesting refill on Hydrocodone-acetaminophen 5-325 mg.  Ordered by Dr. Jacqualyn Posey after I had surgery 08-24-2015.  He isn't in today, I need this refilled, Dr. Burr Medico is my doctor.  I have two pills left.  Please call me at [phone]."  Voicemail sent to collaborative with this request at this time.

## 2015-09-07 ENCOUNTER — Telehealth: Payer: Self-pay | Admitting: Hematology

## 2015-09-07 NOTE — Telephone Encounter (Signed)
per pof to sch pt appt-gave pt copy of avs °

## 2015-09-11 ENCOUNTER — Telehealth: Payer: Self-pay | Admitting: *Deleted

## 2015-09-11 NOTE — Telephone Encounter (Signed)
Received call from Ardath Sax, [place] for Dr. Corrie Mckusick @ [facility] wanting to know about pt's pain medications.  Spoke with Henri and was informed that pt had requested refill of Hydrocodone from Dr. Earleen Newport.  Per Henri, Dr. Earleen Newport had performed Liver Ablation on pt.    Henri needed clarification.  Was informed by Thayer Headings, LPN that pt had picked up script for Hydrocodone # 20 - given by Dr. Burr Medico on Friday 09/07/15.   Gave Henri same info.   Henri's phone     [phone].

## 2015-09-16 DIAGNOSIS — Z923 Personal history of irradiation: Secondary | ICD-10-CM

## 2015-09-16 HISTORY — DX: Personal history of irradiation: Z92.3

## 2015-10-01 ENCOUNTER — Other Ambulatory Visit: Payer: Self-pay | Admitting: Interventional Radiology

## 2015-10-01 DIAGNOSIS — K769 Liver disease, unspecified: Secondary | ICD-10-CM

## 2015-10-01 DIAGNOSIS — C19 Malignant neoplasm of rectosigmoid junction: Secondary | ICD-10-CM

## 2015-10-02 ENCOUNTER — Ambulatory Visit
Admission: RE | Admit: 2015-10-02 | Discharge: 2015-10-02 | Disposition: A | Discharge status: Home / Self Care | Payer: BLUE CROSS/BLUE SHIELD | Source: Ambulatory Visit | Attending: Radiology | Admitting: Radiology

## 2015-10-02 DIAGNOSIS — K769 Liver disease, unspecified: Secondary | ICD-10-CM

## 2015-10-02 DIAGNOSIS — C19 Malignant neoplasm of rectosigmoid junction: Secondary | ICD-10-CM

## 2015-10-02 NOTE — Progress Notes (Signed)
Chief Complaint: Metastatic colon cancer.   Referring Physician(s): Bruning,Kevin  History of Present Illness: Veronica Reyes is a 60 y.o. female presenting to the clinic today as a scheduled follow-up, status post CT-guided percutaneous microwave ablation of single metastatic disease to the right liver performed 08/24/2015.  Veronica Reyes was discharged the following day on 08/25/2015, and she reports she has done quite well after her procedure. She denies any fevers writers or chills. She has episodic discomfort which started a proximally 2 weeks after the procedure, and which she reports pain as intense as 7 out of 10 at her right flank. This is mostly when she is walking about and most active during the day. Because of her Xarelto dosage she has been instructed not to take any ibuprofen or Motrin, and Tylenol has not been effective for controlling her pain.  Other than the occasional pain she has been quite pleased in the first month after her procedure. She has a follow-up appointment with Dr. Burr Medico this coming Thursday, 2 days after this appointment.  Past Medical History  Diagnosis Date  . Hypertension   . Shortness of breath     sob with exertion. dx. with Pulmonary emboli 4'15 ,tx. Xarelto,Lovenox used for [employer].  . Pulmonary embolism ([place])     "blood clot in lungs" -dx. 4'15 with CT Chest.  . Liver lesion   . [facility])     left breast cancer x2  . Breast CA ([place])     radiation and surgery-lt.  . Colon cancer [facility])     Dr. Burr Medico seeing monthly    Past Surgical History  Procedure Laterality Date  . Cesarean section    . Breast surgery      '11- Left breast lumpectomy  . Cholecystectomy      Laparoscopic 20 yrs ago  . Partial colectomy N/A 03/09/2014    Procedure: LAPAROSCOPIC ASSISTED PARTIAL COLECTOMY, splenic flexure mobilization;  Surgeon: Leighton Ruff, MD;  Location: [facility] ORS;  Service: General;  Laterality: N/A;  . Radiofrequency ablation Right 08/24/2015      Procedure: MICROWAVE ABLATION RIGHT LIVER LOBE ;  Surgeon: Corrie Mckusick, DO;  Location: [facility] ORS;  Service: Anesthesiology;  Laterality: Right;    Allergies: Tramadol  Medications: Prior to Admission medications   Medication Sig Start Date End Date Taking? Authorizing Provider  amLODipine (NORVASC) 2.5 MG tablet Take 2.5 mg by mouth daily. 10/23/14  Yes Historical Provider, MD  ferrous sulfate 325 (65 FE) MG tablet Take 325 mg by mouth daily with breakfast.    Yes Historical Provider, MD  hydrochlorothiazide (HYDRODIURIL) 25 MG tablet Take 25 mg by mouth every morning.    Yes Historical Provider, MD  HYDROcodone-acetaminophen (NORCO/VICODIN) 5-325 MG tablet Take 1-2 tablets by mouth every 6 (six) hours as needed for moderate pain. 09/06/15  Yes [name], MD  metoprolol (LOPRESSOR) 50 MG tablet Take 50 mg by mouth 2 (two) times daily.   Yes Historical Provider, MD  potassium chloride SA (K-DUR,KLOR-CON) 20 MEQ tablet Take 1 tablet (20 mEq total) by mouth daily. 08/30/15  Yes [name], MD  XARELTO 20 MG TABS tablet TAKE ONE TABLET BY MOUTH EVERY DAY WITH SUPPER 08/30/15  Yes [name], MD  docusate sodium (COLACE) 100 MG capsule Take 1 capsule (100 mg total) by mouth 2 (two) times daily. Patient not taking: Reported on 10/02/2015 08/25/15   Ascencion Dike, PA-C     Family History  Problem Relation Age of Onset  . Hypertension  Sister   . Cancer Cousin     Social History   Social History  . Marital Status: Married    Spouse Name: N/A  . Number of Children: N/A  . Years of Education: N/A   Social History Main Topics  . Smoking status: Never Smoker   . Smokeless tobacco: Never Used  . Alcohol Use: No  . Drug Use: No  . Sexual Activity: Yes    Birth Control/ Protection: Post-menopausal   Other Topics Concern  . Not on file   Social History Narrative   Married, husband Tyrone Nine    ECOG Status: 1 - Symptomatic but completely ambulatory  Review of Systems: A 12 point ROS discussed  and pertinent positives are indicated in the HPI above.  All other systems are negative.  Review of Systems  Vital Signs: BP 134/82 mmHg  Pulse 107  Temp(Src) 98.4 F (36.9 C) (Oral)  Resp 107  Ht 5\' 6"  (1.676 m)  Wt 274 lb (124.286 kg)  BMI 44.25 kg/m2  SpO2 96%  LMP 01/23/2014  Physical Exam  Mallampati Score:     Imaging: No results found.  Labs:  CBC:  Recent Labs  04/16/15 0945 05/07/15 1034 05/14/15 1109 08/22/15 1445  WBC 6.4 6.7 6.5 8.9  HGB 13.5 12.7 13.5 14.1  HCT 39.9 37.1 39.6 41.8  PLT 180 156 152 226    COAGS:  Recent Labs  01/02/15 0929 08/22/15 1445 08/24/15 0720  INR 1.11 1.16 1.07  APTT 29 30  --     BMP:  Recent Labs  04/16/15 0945 05/07/15 1035 05/14/15 1110 08/22/15 1445  NA 140 143 140 140  K 3.7 2.9* 3.4* 4.3  CL  --   --   --  105  CO2 28 24 25 29   GLUCOSE 119 89 109 101*  BUN 10.5 10.8 9.8 8  CALCIUM 9.3 9.4 9.9 9.8  CREATININE 0.9 0.8 0.9 0.89  GFRNONAA  --   --   --  >60  GFRAA  --   --   --  >60    LIVER FUNCTION TESTS:  Recent Labs  04/16/15 0945 05/07/15 1035 05/14/15 1110 08/22/15 1445  BILITOT 1.00 0.94 0.77 1.0  AST 43* 48* 39* 40  ALT 26 24 19  35  ALKPHOS 48 53 60 43  PROT 7.7 7.6 8.1 8.5*  ALBUMIN 3.2* 3.0* 3.2* 3.9    TUMOR MARKERS:  Recent Labs  11/17/14 1347 03/01/15 1121  CEA 1.2 1.3    Assessment and Plan:  Veronica Reyes is approximately 1 month status post percutaneous microwave ablation of single metastatic disease of the right liver.  She has a follow-up appointment with Dr. Burr Medico of oncology this week.  We would prefer repeat imaging approximately 3 months after her initial procedure, to allow for resolution of any inflammatory changes caused at the site in the liver. This would be approximately 11/22/2014. Of note, her last CEA was performed 03/01/2015, with a value of 1.3.  Given that she has ongoing pain at 7 out of 10 in intensity in her right flank, I did offer her a  prescription of 20 tablets of Vicodin for analgesia. I did emphasize that she has ongoing pain at this region at such a high intensity, we might need to repeat imaging earlier. At this time, she has no signs of infection and I have no suspicion of an infection or problem. My impression is that her pain is still postoperative and should improve.  We will  schedule a follow-up appointment 3 months from today's date, in mid to late April. I answered all of her questions on today's visit, and she has our number if she needs an interval appointment.  Electronically Signed: Corrie Mckusick 10/02/2015, 5:31 PM   I spent a total of    15 Minutes in face to face in clinical consultation, greater than 50% of which was counseling/coordinating care for metastatic colorectal carcinoma, SP microwave ablation of right liver lobe metastasis.

## 2015-10-04 ENCOUNTER — Other Ambulatory Visit: Payer: 59

## 2015-10-04 ENCOUNTER — Encounter: Payer: Self-pay | Admitting: Hematology

## 2015-10-04 ENCOUNTER — Other Ambulatory Visit (HOSPITAL_BASED_OUTPATIENT_CLINIC_OR_DEPARTMENT_OTHER): Payer: BLUE CROSS/BLUE SHIELD

## 2015-10-04 ENCOUNTER — Telehealth: Payer: Self-pay | Admitting: Hematology

## 2015-10-04 ENCOUNTER — Ambulatory Visit (HOSPITAL_BASED_OUTPATIENT_CLINIC_OR_DEPARTMENT_OTHER): Payer: BLUE CROSS/BLUE SHIELD | Admitting: Hematology

## 2015-10-04 ENCOUNTER — Ambulatory Visit: Payer: 59 | Admitting: Hematology

## 2015-10-04 VITALS — BP 134/82 | HR 83 | Temp 98.7°F | Resp 18 | Ht 66.0 in | Wt 278.9 lb

## 2015-10-04 DIAGNOSIS — C50911 Malignant neoplasm of unspecified site of right female breast: Secondary | ICD-10-CM | POA: Diagnosis not present

## 2015-10-04 DIAGNOSIS — C50511 Malignant neoplasm of lower-outer quadrant of right female breast: Secondary | ICD-10-CM

## 2015-10-04 DIAGNOSIS — C187 Malignant neoplasm of sigmoid colon: Secondary | ICD-10-CM

## 2015-10-04 DIAGNOSIS — C787 Secondary malignant neoplasm of liver and intrahepatic bile duct: Secondary | ICD-10-CM | POA: Diagnosis not present

## 2015-10-04 DIAGNOSIS — N289 Disorder of kidney and ureter, unspecified: Secondary | ICD-10-CM | POA: Diagnosis not present

## 2015-10-04 LAB — CBC & DIFF AND RETIC
BASO%: 0.2 % (ref 0.0–2.0)
BASOS ABS: 0 10*3/uL (ref 0.0–0.1)
EOS ABS: 0.1 10*3/uL (ref 0.0–0.5)
EOS%: 0.7 % (ref 0.0–7.0)
HEMATOCRIT: 43.1 % (ref 34.8–46.6)
HEMOGLOBIN: 14.7 g/dL (ref 11.6–15.9)
IMMATURE RETIC FRACT: 6.1 % (ref 1.60–10.00)
LYMPH%: 39.2 % (ref 14.0–49.7)
MCH: 31.9 pg (ref 25.1–34.0)
MCHC: 34.1 g/dL (ref 31.5–36.0)
MCV: 93.5 fL (ref 79.5–101.0)
MONO#: 0.5 10*3/uL (ref 0.1–0.9)
MONO%: 6.2 % (ref 0.0–14.0)
NEUT#: 4.5 10*3/uL (ref 1.5–6.5)
NEUT%: 53.7 % (ref 38.4–76.8)
NRBC: 0 % (ref 0–0)
Platelets: 228 10*3/uL (ref 145–400)
RBC: 4.61 10*6/uL (ref 3.70–5.45)
RDW: 14.4 % (ref 11.2–14.5)
RETIC %: 2.25 % — AB (ref 0.70–2.10)
Retic Ct Abs: 103.73 10*3/uL — ABNORMAL HIGH (ref 33.70–90.70)
WBC: 8.5 10*3/uL (ref 3.9–10.3)
lymph#: 3.3 10*3/uL (ref 0.9–3.3)

## 2015-10-04 LAB — COMPREHENSIVE METABOLIC PANEL
ALBUMIN: 3.7 g/dL (ref 3.5–5.0)
ALK PHOS: 52 U/L (ref 40–150)
ALT: 30 U/L (ref 0–55)
AST: 34 U/L (ref 5–34)
Anion Gap: 7 mEq/L (ref 3–11)
BILIRUBIN TOTAL: 0.65 mg/dL (ref 0.20–1.20)
BUN: 10.8 mg/dL (ref 7.0–26.0)
CO2: 29 mEq/L (ref 22–29)
CREATININE: 0.9 mg/dL (ref 0.6–1.1)
Calcium: 9.9 mg/dL (ref 8.4–10.4)
Chloride: 104 mEq/L (ref 98–109)
EGFR: 82 mL/min/{1.73_m2} — ABNORMAL LOW (ref >90)
GLUCOSE: 91 mg/dL (ref 70–140)
Potassium: 3.5 mEq/L (ref 3.5–5.1)
SODIUM: 140 meq/L (ref 136–145)
TOTAL PROTEIN: 8.5 g/dL — AB (ref 6.4–8.3)

## 2015-10-04 NOTE — Telephone Encounter (Signed)
per pof to sch pt appt-gave pt copy of avs °

## 2015-10-04 NOTE — Progress Notes (Signed)
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 Left breast DCIS diagnosed in 2006 and 2010, status post lumpectomy. 2. pT2N0M0 stage I colon cancer, s/p partial colectomy on 03/09/2014  3. PE in 12/2013 when she was diagnosed with colon cancer.  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   01/07/2014 Initial Diagnosis Breast cancer   01/18/2015 Mammogram 83m mass in lower outer quadrant of right breast    02/01/2015 Initial Biopsy right breast mass biopsy showed invasive ductal carcinoma    02/01/2015 Receptors her2 ER 90%+, PR 10%+, kI67 10%, HER2 pending     Cancer of sigmoid colon ([place]   03/04/2014 Initial Diagnosis Cancer of sigmoid colon   03/09/2014 Pathology Results G1 invasive adenocarcinoma, no lymph-Vascular invasion or Peri-neural invasion: Absent. MMR normal, MSI stable.    03/09/2014 Surgery partial colectomy, negative margins.    11/10/2014 Relapse/Recurrence biopsy confirmed oligo liver recurrence    11/10/2014 Imaging PET showed two small ares of hypermetabolism in the right lobe of the liver, no other distant metastasis.    11/30/2014 Imaging abdomen MRI showed a new enhancing lesion which corresponds to an area of abnormal hyper metabolism on recent PET-CT. Stable enlarged portal caval lymph node.    01/02/2015 Pathology Results Liver biopsy showed metastatic  adenocarcinoma, IHC (+) for CK20, CDX-2, (-) for TTF-1 and ER   01/02/2015 Miscellaneous liver biopsy KRAS mutation (+)    02/14/2015 Imaging CT showed:  the small metastatic right hepatic lobe liver lesion is not identified for certain on this examination. No new lesions. Stable small cysts in segment 4 a.    03/05/2015 - 04/16/2015 Chemotherapy CAPEOX (capecitabine 2500 mg twice daily Day 1-14, oxaliplatin 1 30 mg/m on day 1, every 21 days, S/P 3 cycles    08/24/2015 Procedure CT-guided oligo liver metastasis microwave ablation by Dr. WBobetta Lime    CURRENT TREATMENT:  Pending breast surgery    INTERVAL HISTORY:  Veronica RUFENER89y.o. female with a history of breast cancer, colon cancer s/p laparoscopic-assisted partial colectomy (03/09/2014), PE (01/06/2014), and untreated right breast cancer (01/2015),  presents to clinic for follow up.   She is doing very well overall. She tolerated the liver mass ablation last months very well, she had a mild pain after procedure, no other discomfort. She denies any significant pain, nausea, abdominal discomfort, change of her bowel habits, or other symptoms. She is still taking [place] oil, has good appetite and energy level. She has called her surgeon Dr. BMarlowe Aschoffoffice this month to schedule her breast surgery.   MEDICAL HISTORY: Past Medical History  Diagnosis Date  . Hypertension   . Shortness of breath     sob with exertion. dx. with Pulmonary emboli 4'15 ,tx. Xarelto,Lovenox used for [employer]  . Pulmonary embolism ([place]     "blood clot in lungs" -dx.  4'15 with CT Chest.  . Liver lesion   . Cancer [facility])     left breast cancer x2  . Breast CA ([place])     radiation and surgery-lt.  . Colon cancer [facility])     Dr. Burr Medico seeing monthly    ALLERGIES:  is allergic to tramadol.  MEDICATIONS:    Medication List       This list is accurate as of: 10/04/15  5:29 PM.  Always use your most recent med list.               amLODipine 2.5 MG tablet   Commonly known as:  NORVASC  Take 2.5 mg by mouth daily.     ferrous sulfate 325 (65 FE) MG tablet  Take 325 mg by mouth daily with breakfast.     hydrochlorothiazide 25 MG tablet  Commonly known as:  HYDRODIURIL  Take 25 mg by mouth every morning.     HYDROcodone-acetaminophen 5-325 MG tablet  Commonly known as:  NORCO/VICODIN  Take 1-2 tablets by mouth every 6 (six) hours as needed for moderate pain.     metoprolol 50 MG tablet  Commonly known as:  LOPRESSOR  Take 50 mg by mouth 2 (two) times daily.     potassium chloride SA 20 MEQ tablet  Commonly known as:  K-DUR,KLOR-CON  Take 1 tablet (20 mEq total) by mouth daily.     XARELTO 20 MG Tabs tablet  Generic drug:  rivaroxaban  TAKE ONE TABLET BY MOUTH EVERY DAY WITH SUPPER        SURGICAL HISTORY:  Past Surgical History  Procedure Laterality Date  . Cesarean section    . Breast surgery      '11- Left breast lumpectomy  . Cholecystectomy      Laparoscopic 20 yrs ago  . Partial colectomy N/A 03/09/2014    Procedure: LAPAROSCOPIC ASSISTED PARTIAL COLECTOMY, splenic flexure mobilization;  Surgeon: Leighton Ruff, MD;  Location: [facility] ORS;  Service: General;  Laterality: N/A;  . Radiofrequency ablation Right 08/24/2015    Procedure: MICROWAVE ABLATION RIGHT LIVER LOBE ;  Surgeon: Corrie Mckusick, DO;  Location: [facility];  Service: Anesthesiology;  Laterality: Right;    REVIEW OF SYSTEMS:   Constitutional: Denies fevers, chills or abnormal weight loss Eyes: Denies blurriness of vision Ears, nose, mouth, throat, and face: Denies mucositis or sore throat Respiratory: Denies cough, dyspnea or wheezes Cardiovascular: Denies palpitation, chest discomfort or lower extremity swelling Gastrointestinal:  Denies nausea, heartburn or change in bowel habits Skin: Denies abnormal skin rashes Lymphatics: Denies new lymphadenopathy or easy bruising Neurological:Denies numbness, tingling or new weaknesses Behavioral/Psych: Mood is stable, no  new changes  All other systems were reviewed with the patient and are negative. Breasts: Breast inspection showed them to be symmetrical with no nipple discharge. Palpation of the breasts and axilla revealed no obvious mass that I could appreciate.   Old surgical scar in the left breast. (+)  Skin erythema below both breasts   PHYSICAL EXAMINATION: ECOG PERFORMANCE STATUS: 1 Blood pressure 134/82, pulse 83, temperature 98.7 F (37.1 C), temperature source Oral, resp. rate 18, height '5\' 6"'  (1.676 m), weight 278 lb 14.4 oz (126.508 kg), last menstrual period 01/23/2014, SpO2 98 %. GENERAL:alert, no distress and comfortable; well developed and obese. Easily mobile to exam table.  SKIN: skin color, texture, turgor are normal, no rashes or significant lesions EYES: normal, Conjunctiva are pink and non-injected, sclera clear OROPHARYNX:no exudate, no erythema and lips, buccal mucosa, and tongue  normal  NECK: supple, thyroid normal size, non-tender, without nodularity LYMPH:  no palpable lymphadenopathy in the cervical, axillary or supraclavicular LUNGS: clear to auscultation with normal breathing effort, no wheezes or rhonchi HEART: regular rate & rhythm and no murmurs and no lower extremity edema ABDOMEN:abdomen soft, non-tender and normal bowel sounds; incision healing with signs of infection. Rectal exam negative for palpable lesion, no blood on glove  Musculoskeletal:no cyanosis of digits and no clubbing  NEURO: alert & oriented x 3 with fluent speech, no focal motor/sensory deficits Breasts: Breast inspection showed them to be symmetrical with no nipple discharge. Palpation of the breasts and axilla revealed no obvious mass that I could appreciate.   Labs:  CBC Latest Ref Rng 10/04/2015 08/22/2015 05/14/2015  WBC 3.9 - 10.3 10e3/uL 8.5 8.9 6.5  Hemoglobin 11.6 - 15.9 g/dL 14.7 14.1 13.5  Hematocrit 34.8 - 46.6 % 43.1 41.8 39.6  Platelets 145 - 400 10e3/uL 228 226 152    CMP Latest Ref Rng  10/04/2015 08/22/2015 05/14/2015  Glucose 70 - 140 mg/dl 91 101(H) 109  BUN 7.0 - 26.0 mg/dL 10.8 8 9.8  Creatinine 0.6 - 1.1 mg/dL 0.9 0.89 0.9  Sodium 136 - 145 mEq/L 140 140 140  Potassium 3.5 - 5.1 mEq/L 3.5 4.3 3.4(L)  Chloride 101 - 111 mmol/L - 105 -  CO2 22 - 29 mEq/L '29 29 25  ' Calcium 8.4 - 10.4 mg/dL 9.9 9.8 9.9  Total Protein 6.4 - 8.3 g/dL 8.5(H) 8.5(H) 8.1  Total Bilirubin 0.20 - 1.20 mg/dL 0.65 1.0 0.77  Alkaline Phos 40 - 150 U/L 52 43 60  AST 5 - 34 U/L 34 40 39(H)  ALT 0 - 55 U/L 30 35 19    PATHOLOGY RESULT Diagnosis 01/02/2015 Liver, needle/core biopsy, right - POSITIVE FOR METASTATIC ADENOCARCINOMA. - SEE COMMENT. Microscopic Comment Immunohistochemical stains are performed. The tumor is positive for cytokeratin 20 and CDX-2. Cytokeratin 7 stains background bile ducts but is negative within the tumor. The tumor is negative for TTF-1 and estrogen receptor. The morphology coupled with the staining pattern is consistent with metastatic adenocarcinoma of colorectal primary source.   Diagnosis 02/01/2015 Breast, right, needle core biopsy, LOQ - INVASIVE DUCTAL CARCINOMA, SEE COMMENT. - DUCTAL CARCINOMA IN SITU. Microscopic Comment Although the grade of tumor is best assessed at resection, with these biopsies, both the invasive and in situ carcinoma are grade I-II. Breast prognostic studies are pending and will be reported in an addendum. Both the invasive and in situ carcinoma demonstrate strongly diffuse E. cadherin expression; supporting a ductal phenotype. The absence of the myoepithelial layer was confirmed with smooth muscle myosin heavy chain, calponin, and p63 immunostains. The case was reviewed by Dr Avis Epley who concurs.  Results: IMMUNOHISTOCHEMICAL AND MORPHOMETRIC ANALYSIS BY THE AUTOMATED CELLULAR IMAGING SYSTEM (ACIS) Estrogen Receptor: 90%, POSITIVE, STRONG STAINING INTENSITY (PERFORMED MANUALLY) Progesterone Receptor: 10%, POSITIVE, STRONG STAINING  INTENSITY (PERFORMED MANUALLY) Proliferation Marker Ki67: 10%  HER2 - NEGATIVE RATIO OF HER2/CEP17 SIGNALS 1.09 AVERAGE HER2 COPY NUMBER PER CELL 1.90  RADIOLOGY STUDIES   Abdomen MRI w wo contrast 05/29/2015  IMPRESSION: 1. Mild degradation secondary to patient body habitus. 2. Isolated segment 6 subcapsular liver lesion is similar. No new or progressive disease identified. 3. Interpolar right renal lesion is favored to represent a complex/septated cyst and is not significantly changed in size.  CT guided tissue ablation by Dr. Earleen Newport 08/24/2015 IMPRESSION: Status post image guided microwave ablation of solitary metastasis in the right liver lobe in  this patient with oligo metastatic colorectal cancer.  ASSESSMENT: Sharolyn Douglas 60 y.o. female with a history of  1.Colon Cancer, Stage I (pT2N0), MMR normal, oligo recurrent disease in liver in 12/2014, KRAS mutation (+) -I previously reviewed the nature history of metastatic colon cancer, and the potential curative approach with chemotherapy followed by liver lesion ablation or resection. However, more likely, this is not curable disease. -She is status post 3 cycles of [place]ox, does not want to proceed with fourth cycle due to the moderate side effects from previous chemotherapy treatment. She declined liver mass resection, and underwent liver ablation by interventional radiologist Dr. Earleen Newport. -We'll follow up her liver MRI every 3-4 months. Dr. Earleen Newport is likely going to order. I'll also see him every 3 months with lab including CBC, CMP and CEA.  2. History of PE (01/06/2014) --She has a diagnosis of pulmonary embolism, likely secondary to malignancy in the setting of family history for stroke (and possible stroke history in patient as well). Lower extremity dopplers negative.  -continue Xarelto. Patient asked about if she can come off Xarelto. If she has no evidence of cancer in 3-6 months, I can potentially take her off Xarelto.  She understands she has moderate to high risk of recurrent thrombosis if she is off anticoagulation.  3. New right breast cancer, T1aN0M0, stage Ia, ER+/PR+/HER2-, History of left Breast Cancer, diagnosed on 02/01/2015 --Left CIS in situ, s/p lumpectomy in 2006 and 2011 by Dr. Margot Chimes, and radiation in 2011 by Dr. Sondra Come. Last mammogram on 01/09/14 negative - I discussed her right breast imaging finding and biopsy results.  Unfortunately she has early-stage right breast cancer now.  - I discussed the standard treatment option is lumpectomy plus sentinel lymph nodes for her breast cancer,  Followed by adjuvant radiation and endocrine therapy ( antiestrogen therapy).  -her breast surgery has been delayed due to her liver targeted therapy and her insurance issue -I'll get a repeated diagnostic mammogram and ultrasound to give her her breast mass, and I sent a message to Dr. [name] to see if she can schedule her breast surgery as soon as possible. -If her tumor is more than 0.5cm and node negative, I will send her surgical tumor sample for the Oncotype DX test, she is agreeable. -If her breast surgery is going to be further delayed, I will start her on aromatase inhibitor.  4.  Uterine Fibroids  --Negative endometrial biopsy. Following with Gynecology.  5. Kidney lesion (Right). -No hypermetabolic right kidney lesion on the pet scan -Repeat abdominal MRI 05/29/2015 showed unchanged right renal lesion, favored to represent a complex/septated cyst.  Plan  -Diagnostic mammogram and ultrasound to evaluate her known right breast malignancy -She will see Dr. [name] and have breast surgery soon -I will see her back 2 weeks after her surgery. Oncotype if tumor >0.5cm and node negative    All questions were answered. The patient knows to call the clinic with any problems, questions or concerns. We can certainly see the patient much sooner if necessary.  I spent 25 minutes counseling the patient face to  face. The total time spent in the appointment was 30 minutes.   [name]  10/04/2015

## 2015-10-04 NOTE — Telephone Encounter (Signed)
sch mamma-pt has time/date/location

## 2015-10-05 LAB — CEA: CEA1: 2.8 ng/mL (ref 0.0–4.7)

## 2015-10-05 LAB — CEA (PARALLEL TESTING): CEA: 1.5 ng/mL (ref 0.0–5.0)

## 2015-10-10 ENCOUNTER — Other Ambulatory Visit: Payer: Self-pay | Admitting: General Surgery

## 2015-10-10 DIAGNOSIS — C50511 Malignant neoplasm of lower-outer quadrant of right female breast: Secondary | ICD-10-CM

## 2015-10-12 ENCOUNTER — Other Ambulatory Visit: Payer: Self-pay | Admitting: General Surgery

## 2015-10-12 ENCOUNTER — Ambulatory Visit
Admission: RE | Admit: 2015-10-12 | Discharge: 2015-10-12 | Disposition: A | Discharge status: Home / Self Care | Payer: BLUE CROSS/BLUE SHIELD | Source: Ambulatory Visit | Attending: General Surgery | Admitting: General Surgery

## 2015-10-12 ENCOUNTER — Ambulatory Visit
Admission: RE | Admit: 2015-10-12 | Discharge: 2015-10-12 | Disposition: A | Discharge status: Home / Self Care | Payer: BLUE CROSS/BLUE SHIELD | Source: Ambulatory Visit | Attending: Hematology | Admitting: Hematology

## 2015-10-12 ENCOUNTER — Other Ambulatory Visit: Payer: Self-pay | Admitting: Hematology

## 2015-10-12 DIAGNOSIS — C50511 Malignant neoplasm of lower-outer quadrant of right female breast: Secondary | ICD-10-CM

## 2015-10-16 ENCOUNTER — Other Ambulatory Visit: Payer: Self-pay | Admitting: Hematology

## 2015-10-19 ENCOUNTER — Other Ambulatory Visit: Payer: Self-pay | Admitting: General Surgery

## 2015-10-29 ENCOUNTER — Other Ambulatory Visit: Payer: Self-pay | Admitting: General Surgery

## 2015-10-29 DIAGNOSIS — C50511 Malignant neoplasm of lower-outer quadrant of right female breast: Secondary | ICD-10-CM

## 2015-10-31 ENCOUNTER — Encounter (HOSPITAL_BASED_OUTPATIENT_CLINIC_OR_DEPARTMENT_OTHER): Payer: Self-pay | Admitting: *Deleted

## 2015-10-31 NOTE — Progress Notes (Signed)
Per Dr [name], patient's last dose of Xarelto will be 11-02-15. Having seed placed 11-05-15.

## 2015-11-02 ENCOUNTER — Encounter (HOSPITAL_BASED_OUTPATIENT_CLINIC_OR_DEPARTMENT_OTHER)
Admission: RE | Admit: 2015-11-02 | Discharge: 2015-11-02 | Disposition: A | Discharge status: Home / Self Care | Payer: BLUE CROSS/BLUE SHIELD | Source: Ambulatory Visit | Attending: General Surgery | Admitting: General Surgery

## 2015-11-02 DIAGNOSIS — C50511 Malignant neoplasm of lower-outer quadrant of right female breast: Secondary | ICD-10-CM | POA: Insufficient documentation

## 2015-11-02 DIAGNOSIS — Z01812 Encounter for preprocedural laboratory examination: Secondary | ICD-10-CM | POA: Insufficient documentation

## 2015-11-02 LAB — BASIC METABOLIC PANEL
Anion gap: 9 (ref 5–15)
BUN: 7 mg/dL (ref 6–20)
CHLORIDE: 103 mmol/L (ref 101–111)
CO2: 28 mmol/L (ref 22–32)
CREATININE: 0.8 mg/dL (ref 0.44–1.00)
Calcium: 9.4 mg/dL (ref 8.9–10.3)
GFR calc Af Amer: >60 mL/min (ref >60)
GFR calc non Af Amer: >60 mL/min (ref >60)
GLUCOSE: 94 mg/dL (ref 65–99)
Potassium: 4.2 mmol/L (ref 3.5–5.1)
Sodium: 140 mmol/L (ref 135–145)

## 2015-11-02 NOTE — Progress Notes (Signed)
Dr. Linna Caprice consulted/ will proceed with scheduled surgery

## 2015-11-05 ENCOUNTER — Ambulatory Visit
Admission: RE | Admit: 2015-11-05 | Discharge: 2015-11-05 | Disposition: A | Discharge status: Home / Self Care | Payer: BLUE CROSS/BLUE SHIELD | Source: Ambulatory Visit | Attending: General Surgery | Admitting: General Surgery

## 2015-11-05 DIAGNOSIS — C50511 Malignant neoplasm of lower-outer quadrant of right female breast: Secondary | ICD-10-CM

## 2015-11-07 ENCOUNTER — Encounter (HOSPITAL_BASED_OUTPATIENT_CLINIC_OR_DEPARTMENT_OTHER)
Admission: RE | Disposition: A | Discharge status: Home / Self Care | Payer: Self-pay | Source: Ambulatory Visit | Attending: General Surgery

## 2015-11-07 ENCOUNTER — Ambulatory Visit
Admission: RE | Admit: 2015-11-07 | Discharge: 2015-11-07 | Disposition: A | Discharge status: Home / Self Care | Payer: BLUE CROSS/BLUE SHIELD | Source: Ambulatory Visit | Attending: General Surgery | Admitting: General Surgery

## 2015-11-07 ENCOUNTER — Ambulatory Visit (HOSPITAL_BASED_OUTPATIENT_CLINIC_OR_DEPARTMENT_OTHER): Payer: BLUE CROSS/BLUE SHIELD | Admitting: Anesthesiology

## 2015-11-07 ENCOUNTER — Encounter (HOSPITAL_BASED_OUTPATIENT_CLINIC_OR_DEPARTMENT_OTHER): Payer: Self-pay | Admitting: Anesthesiology

## 2015-11-07 ENCOUNTER — Ambulatory Visit (HOSPITAL_BASED_OUTPATIENT_CLINIC_OR_DEPARTMENT_OTHER)
Admission: RE | Admit: 2015-11-07 | Discharge: 2015-11-07 | Disposition: A | Discharge status: Home / Self Care | Payer: BLUE CROSS/BLUE SHIELD | Source: Ambulatory Visit | Attending: General Surgery | Admitting: General Surgery

## 2015-11-07 ENCOUNTER — Ambulatory Visit (HOSPITAL_COMMUNITY)
Admission: RE | Admit: 2015-11-07 | Discharge: 2015-11-07 | Disposition: A | Discharge status: Home / Self Care | Payer: BLUE CROSS/BLUE SHIELD | Source: Ambulatory Visit | Attending: General Surgery | Admitting: General Surgery

## 2015-11-07 DIAGNOSIS — Z6841 Body Mass Index (BMI) 40.0 and over, adult: Secondary | ICD-10-CM | POA: Insufficient documentation

## 2015-11-07 DIAGNOSIS — Z85038 Personal history of other malignant neoplasm of large intestine: Secondary | ICD-10-CM | POA: Insufficient documentation

## 2015-11-07 DIAGNOSIS — I10 Essential (primary) hypertension: Secondary | ICD-10-CM | POA: Insufficient documentation

## 2015-11-07 DIAGNOSIS — C50511 Malignant neoplasm of lower-outer quadrant of right female breast: Secondary | ICD-10-CM

## 2015-11-07 DIAGNOSIS — Z79899 Other long term (current) drug therapy: Secondary | ICD-10-CM | POA: Insufficient documentation

## 2015-11-07 DIAGNOSIS — Z86711 Personal history of pulmonary embolism: Secondary | ICD-10-CM | POA: Insufficient documentation

## 2015-11-07 DIAGNOSIS — Z86718 Personal history of other venous thrombosis and embolism: Secondary | ICD-10-CM | POA: Diagnosis not present

## 2015-11-07 DIAGNOSIS — C50911 Malignant neoplasm of unspecified site of right female breast: Secondary | ICD-10-CM | POA: Diagnosis present

## 2015-11-07 DIAGNOSIS — Z170 Estrogen receptor positive status [ER+]: Secondary | ICD-10-CM | POA: Insufficient documentation

## 2015-11-07 DIAGNOSIS — Z9049 Acquired absence of other specified parts of digestive tract: Secondary | ICD-10-CM | POA: Insufficient documentation

## 2015-11-07 DIAGNOSIS — Z7901 Long term (current) use of anticoagulants: Secondary | ICD-10-CM | POA: Insufficient documentation

## 2015-11-07 HISTORY — DX: Anxiety disorder, unspecified: F41.9

## 2015-11-07 HISTORY — PX: BREAST LUMPECTOMY: SHX2

## 2015-11-07 HISTORY — PX: BREAST LUMPECTOMY WITH RADIOACTIVE SEED AND SENTINEL LYMPH NODE BIOPSY: SHX6550

## 2015-11-07 SURGERY — BREAST LUMPECTOMY WITH RADIOACTIVE SEED AND SENTINEL LYMPH NODE BIOPSY
Anesthesia: Regional | Site: Breast | Laterality: Right

## 2015-11-07 MED ORDER — SODIUM CHLORIDE 0.9 % IJ SOLN
INTRAMUSCULAR | Status: AC
  Filled 2015-11-07: qty 10

## 2015-11-07 MED ORDER — CEFAZOLIN SODIUM-DEXTROSE 2-3 GM-% IV SOLR
INTRAVENOUS | Status: AC
  Filled 2015-11-07: qty 50

## 2015-11-07 MED ORDER — FENTANYL CITRATE (PF) 100 MCG/2ML IJ SOLN
50.0000 ug | INTRAMUSCULAR | Status: AC | PRN
  Administered 2015-11-07 (×3): 50 ug via INTRAVENOUS
  Administered 2015-11-07 (×3): 25 ug via INTRAVENOUS
  Administered 2015-11-07: 50 ug via INTRAVENOUS

## 2015-11-07 MED ORDER — CEFAZOLIN SODIUM-DEXTROSE 2-3 GM-% IV SOLR: 2.0000 g | INTRAVENOUS | Status: DC

## 2015-11-07 MED ORDER — HYDROMORPHONE HCL 1 MG/ML IJ SOLN
0.2500 mg | INTRAMUSCULAR | Status: DC | PRN
  Administered 2015-11-07: 0.25 mg via INTRAVENOUS

## 2015-11-07 MED ORDER — OXYCODONE HCL 5 MG PO TABS: 5.0000 mg | ORAL_TABLET | Freq: Four times a day (QID) | ORAL | Status: DC | PRN

## 2015-11-07 MED ORDER — SODIUM CHLORIDE 0.9% FLUSH: 3.0000 mL | INTRAVENOUS | Status: DC | PRN

## 2015-11-07 MED ORDER — FENTANYL CITRATE (PF) 100 MCG/2ML IJ SOLN
INTRAMUSCULAR | Status: AC
  Filled 2015-11-07: qty 2

## 2015-11-07 MED ORDER — DEXAMETHASONE SODIUM PHOSPHATE 4 MG/ML IJ SOLN
INTRAMUSCULAR | Status: DC | PRN
  Administered 2015-11-07: 10 mg via INTRAVENOUS

## 2015-11-07 MED ORDER — ONDANSETRON HCL 4 MG/2ML IJ SOLN: 4.0000 mg | Freq: Four times a day (QID) | INTRAMUSCULAR | Status: DC | PRN

## 2015-11-07 MED ORDER — PROPOFOL 10 MG/ML IV BOLUS
INTRAVENOUS | Status: DC | PRN
  Administered 2015-11-07: 160 mg via INTRAVENOUS

## 2015-11-07 MED ORDER — OXYCODONE HCL 5 MG/5ML PO SOLN: 5.0000 mg | Freq: Once | ORAL | Status: AC | PRN

## 2015-11-07 MED ORDER — LACTATED RINGERS IV SOLN
INTRAVENOUS | Status: DC
  Administered 2015-11-07 (×2): via INTRAVENOUS

## 2015-11-07 MED ORDER — MIDAZOLAM HCL 2 MG/2ML IJ SOLN
INTRAMUSCULAR | Status: AC
  Filled 2015-11-07: qty 2

## 2015-11-07 MED ORDER — BUPIVACAINE-EPINEPHRINE (PF) 0.5% -1:200000 IJ SOLN
INTRAMUSCULAR | Status: DC | PRN
  Administered 2015-11-07: 20 mL via PERINEURAL

## 2015-11-07 MED ORDER — SODIUM CHLORIDE 0.9% FLUSH: 3.0000 mL | Freq: Two times a day (BID) | INTRAVENOUS | Status: DC

## 2015-11-07 MED ORDER — ACETAMINOPHEN 650 MG RE SUPP: 650.0000 mg | RECTAL | Status: DC | PRN

## 2015-11-07 MED ORDER — DEXTROSE 5 % IV SOLN
3.0000 g | INTRAVENOUS | Status: AC
  Administered 2015-11-07: 3 g via INTRAVENOUS

## 2015-11-07 MED ORDER — GLYCOPYRROLATE 0.2 MG/ML IJ SOLN
INTRAMUSCULAR | Status: AC
  Filled 2015-11-07: qty 1

## 2015-11-07 MED ORDER — LIDOCAINE-EPINEPHRINE (PF) 1 %-1:200000 IJ SOLN
INTRAMUSCULAR | Status: DC | PRN
  Administered 2015-11-07: 20 mL via INTRAMUSCULAR

## 2015-11-07 MED ORDER — ACETAMINOPHEN 325 MG PO TABS: 650.0000 mg | ORAL_TABLET | ORAL | Status: DC | PRN

## 2015-11-07 MED ORDER — SODIUM CHLORIDE 0.9 % IV SOLN: 250.0000 mL | INTRAVENOUS | Status: DC | PRN

## 2015-11-07 MED ORDER — SODIUM CHLORIDE 0.9 % IJ SOLN
INTRAVENOUS | Status: DC | PRN
  Administered 2015-11-07: 5 mL

## 2015-11-07 MED ORDER — LIDOCAINE-EPINEPHRINE (PF) 1 %-1:200000 IJ SOLN
INTRAMUSCULAR | Status: AC
  Filled 2015-11-07: qty 30

## 2015-11-07 MED ORDER — BUPIVACAINE HCL (PF) 0.25 % IJ SOLN
INTRAMUSCULAR | Status: AC
  Filled 2015-11-07: qty 30

## 2015-11-07 MED ORDER — OXYCODONE HCL 5 MG PO TABS
ORAL_TABLET | ORAL | Status: AC
  Filled 2015-11-07: qty 1

## 2015-11-07 MED ORDER — LIDOCAINE HCL (CARDIAC) 20 MG/ML IV SOLN
INTRAVENOUS | Status: DC | PRN
  Administered 2015-11-07: 100 mg via INTRAVENOUS

## 2015-11-07 MED ORDER — OXYCODONE HCL 5 MG PO TABS: 5.0000 mg | ORAL_TABLET | ORAL | Status: DC | PRN

## 2015-11-07 MED ORDER — HYDROMORPHONE HCL 1 MG/ML IJ SOLN
INTRAMUSCULAR | Status: AC
  Filled 2015-11-07: qty 1

## 2015-11-07 MED ORDER — GLYCOPYRROLATE 0.2 MG/ML IJ SOLN: 0.2000 mg | Freq: Once | INTRAMUSCULAR | Status: DC | PRN

## 2015-11-07 MED ORDER — MIDAZOLAM HCL 2 MG/2ML IJ SOLN
1.0000 mg | INTRAMUSCULAR | Status: DC | PRN
  Administered 2015-11-07 (×2): 1 mg via INTRAVENOUS

## 2015-11-07 MED ORDER — SCOPOLAMINE 1 MG/3DAYS TD PT72: 1.0000 | MEDICATED_PATCH | Freq: Once | TRANSDERMAL | Status: DC | PRN

## 2015-11-07 MED ORDER — OXYCODONE HCL 5 MG PO TABS
5.0000 mg | ORAL_TABLET | Freq: Once | ORAL | Status: AC | PRN
  Administered 2015-11-07: 5 mg via ORAL

## 2015-11-07 MED ORDER — TECHNETIUM TC 99M SULFUR COLLOID FILTERED
1.0000 | Freq: Once | INTRAVENOUS | Status: AC | PRN
  Administered 2015-11-07: 1 via INTRADERMAL

## 2015-11-07 MED ORDER — CEFAZOLIN SODIUM 1-5 GM-% IV SOLN
INTRAVENOUS | Status: AC
  Filled 2015-11-07: qty 50

## 2015-11-07 SURGICAL SUPPLY — 71 items
ADH SKN CLS APL DERMABOND .7 (GAUZE/BANDAGES/DRESSINGS) ×1
APPLIER CLIP 9.375 MED OPEN (MISCELLANEOUS)
APR CLP MED 9.3 20 MLT OPN (MISCELLANEOUS)
BINDER BREAST LRG (GAUZE/BANDAGES/DRESSINGS) IMPLANT
BINDER BREAST MEDIUM (GAUZE/BANDAGES/DRESSINGS) IMPLANT
BINDER BREAST XLRG (GAUZE/BANDAGES/DRESSINGS) IMPLANT
BINDER BREAST XXLRG (GAUZE/BANDAGES/DRESSINGS) IMPLANT
BLADE HEX COATED 2.75 (ELECTRODE) ×3 IMPLANT
BLADE SURG 10 STRL SS (BLADE) ×3 IMPLANT
BLADE SURG 15 STRL LF DISP TIS (BLADE) ×1 IMPLANT
BLADE SURG 15 STRL SS (BLADE) ×3
BNDG COHESIVE 4X5 TAN STRL (GAUZE/BANDAGES/DRESSINGS) ×3 IMPLANT
CANISTER SUC SOCK COL 7IN (MISCELLANEOUS) IMPLANT
CANISTER SUCT 1200ML W/VALVE (MISCELLANEOUS) ×3 IMPLANT
CHLORAPREP W/TINT 26ML (MISCELLANEOUS) ×3 IMPLANT
CLIP APPLIE 9.375 MED OPEN (MISCELLANEOUS) IMPLANT
CLIP TI LARGE 6 (CLIP) ×3 IMPLANT
CLIP TI MEDIUM 6 (CLIP) ×6 IMPLANT
CLIP TI WIDE RED SMALL 6 (CLIP) IMPLANT
CLOSURE WOUND 1/2 X4 (GAUZE/BANDAGES/DRESSINGS) ×1
COVER MAYO STAND STRL (DRAPES) ×3 IMPLANT
COVER PROBE W GEL 5X96 (DRAPES) ×3 IMPLANT
DECANTER SPIKE VIAL GLASS SM (MISCELLANEOUS) ×2 IMPLANT
DERMABOND ADVANCED (GAUZE/BANDAGES/DRESSINGS) ×2
DERMABOND ADVANCED .7 DNX12 (GAUZE/BANDAGES/DRESSINGS) ×1 IMPLANT
DEVICE DUBIN W/COMP PLATE 8390 (MISCELLANEOUS) ×3 IMPLANT
DRAPE UTILITY XL STRL (DRAPES) ×3 IMPLANT
DRSG PAD ABDOMINAL 8X10 ST (GAUZE/BANDAGES/DRESSINGS) IMPLANT
ELECT REM PT RETURN 9FT ADLT (ELECTROSURGICAL) ×3
ELECTRODE REM PT RTRN 9FT ADLT (ELECTROSURGICAL) ×1 IMPLANT
GLOVE BIO SURGEON STRL SZ 6 (GLOVE) ×3 IMPLANT
GLOVE BIO SURGEON STRL SZ7 (GLOVE) ×2 IMPLANT
GLOVE BIOGEL PI IND STRL 6.5 (GLOVE) ×1 IMPLANT
GLOVE BIOGEL PI IND STRL 7.0 (GLOVE) IMPLANT
GLOVE BIOGEL PI INDICATOR 6.5 (GLOVE) ×2
GLOVE BIOGEL PI INDICATOR 7.0 (GLOVE) ×4
GOWN STRL REUS W/ TWL LRG LVL3 (GOWN DISPOSABLE) ×1 IMPLANT
GOWN STRL REUS W/TWL 2XL LVL3 (GOWN DISPOSABLE) ×3 IMPLANT
GOWN STRL REUS W/TWL LRG LVL3 (GOWN DISPOSABLE) ×3
ILLUMINATOR WAVEGUIDE N/F (MISCELLANEOUS) IMPLANT
KIT MARKER MARGIN INK (KITS) ×3 IMPLANT
LIGHT WAVEGUIDE WIDE FLAT (MISCELLANEOUS) ×2 IMPLANT
NDL HYPO 25X1 1.5 SAFETY (NEEDLE) ×1 IMPLANT
NDL SAFETY ECLIPSE 18X1.5 (NEEDLE) IMPLANT
NEEDLE HYPO 18GX1.5 SHARP (NEEDLE)
NEEDLE HYPO 25X1 1.5 SAFETY (NEEDLE) ×3 IMPLANT
NS IRRIG 1000ML POUR BTL (IV SOLUTION) ×3 IMPLANT
PACK BASIN DAY SURGERY FS (CUSTOM PROCEDURE TRAY) ×3 IMPLANT
PACK UNIVERSAL I (CUSTOM PROCEDURE TRAY) ×3 IMPLANT
PENCIL BUTTON HOLSTER BLD 10FT (ELECTRODE) ×3 IMPLANT
SLEEVE SCD COMPRESS KNEE MED (MISCELLANEOUS) ×3 IMPLANT
SPONGE GAUZE 4X4 12PLY STER LF (GAUZE/BANDAGES/DRESSINGS) ×3 IMPLANT
SPONGE LAP 18X18 X RAY DECT (DISPOSABLE) ×6 IMPLANT
STAPLER VISISTAT 35W (STAPLE) IMPLANT
STOCKINETTE IMPERVIOUS LG (DRAPES) ×3 IMPLANT
STRIP CLOSURE SKIN 1/2X4 (GAUZE/BANDAGES/DRESSINGS) ×2 IMPLANT
SUT ETHILON 2 0 FS 18 (SUTURE) IMPLANT
SUT MNCRL AB 4-0 PS2 18 (SUTURE) ×3 IMPLANT
SUT MON AB 5-0 PS2 18 (SUTURE) IMPLANT
SUT SILK 2 0 SH (SUTURE) IMPLANT
SUT VIC AB 2-0 SH 27 (SUTURE) ×3
SUT VIC AB 2-0 SH 27XBRD (SUTURE) ×1 IMPLANT
SUT VIC AB 3-0 SH 27 (SUTURE) ×3
SUT VIC AB 3-0 SH 27X BRD (SUTURE) ×1 IMPLANT
SUT VIC AB 5-0 PS2 18 (SUTURE) IMPLANT
SYR CONTROL 10ML LL (SYRINGE) ×3 IMPLANT
TOWEL OR 17X24 6PK STRL BLUE (TOWEL DISPOSABLE) ×3 IMPLANT
TOWEL OR NON WOVEN STRL DISP B (DISPOSABLE) ×3 IMPLANT
TUBE CONNECTING 20'X1/4 (TUBING) ×1
TUBE CONNECTING 20X1/4 (TUBING) ×2 IMPLANT
YANKAUER SUCT BULB TIP NO VENT (SUCTIONS) ×3 IMPLANT

## 2015-11-07 NOTE — Progress Notes (Signed)
Nuc med staff performed nuc med inj. Pt tol well with additional sedation. VSS. Will bring Husband back and update/provide emotional support.

## 2015-11-07 NOTE — H&P (Signed)
Veronica Reyes Heads 10/19/2015 7:57 AM Location: [facility] Patient #: [phone] DOB: February 13, 1956 Married / Language: English / Race: Black or African American Female   History of Present Illness Stark Klein MD; 10/19/2015 11:03 AM) The patient is a 60 year old female who presents with breast cancer. PREVIOUS HISTORY Veronica Reyes is a 60 year old female who is referred for consultation by Dr. Burr Medico for right breast cancer and metastatic colon cancer. She is status post lumpectomy for left breast DCIS in 2006 and 2010. She had a new right mammogram following her colon cancer diagnosis that demonstrated a 3 mm lower outer breast mass. core needle biopsy showed invasive ductal carcinoma that was ER and PR positive. She has not had this treated yet because of her colon cancer. She denies any palpable mass.]  Patient has undergone ablation of her liver metastasis and is here for treatment of her breast cancer. She is doing well. She is not having any breast pain. She had a repeat mammogram and ultrasound that did not show any significant change.   mammo/ultrasound 10/12/2015 IMPRESSION: 1. Biopsy proven malignancy in the right breast appears overall unchanged, measuring approximately 0.5 cm. No definite additional suspicious masses or abnormalities are seen in the right breast, although note the patient does have heterogeneous breast parenchyma.  2. Right axillary lymph nodes with mildly thickened cortices appear unchanged mammographically dating back to 2010, likely within normal limits for the patient.  3. No mammographic evidence of malignancy in the left breast.   Medication History Conni Slipper, RN; 10/19/2015 7:58 AM) Medications Reconciled    Review of Systems Stark Klein MD; 10/19/2015 11:03 AM) All other systems negative   Physical Exam Stark Klein MD; 10/19/2015 11:04 AM) General Mental Status-Alert. General Appearance-Consistent with stated  age. Hydration-Well hydrated. Voice-Normal.  Chest and Lung Exam Chest and lung exam reveals -quiet, even and easy respiratory effort with no use of accessory muscles. Inspection Chest Wall - Normal. Back - normal.  Breast Note: right breast slightly larger than left.     Assessment & Plan Stark Klein MD; 10/19/2015 11:01 AM) CARCINOMA OF LOWER-OUTER QUADRANT OF RIGHT FEMALE BREAST (C50.511) Impression: We reviewed the plan, risks and benefits.  The patient will be treated with a right Seed localized lumpectomy and sentinel lymph node biopsy.  The surgical procedure was described to the patient. I discussed the incision type and location and that we would need radiology involved on with a wire or seed marker and/or sentinel node.  The risks and benefits of the procedure were described to the patient and she wishes to proceed.  We discussed the risks bleeding, infection, damage to other structures, need for further procedures/surgeries. We discussed the risk of seroma. The patient was advised if the area in the breast in cancer, we may need to go back to surgery for additional tissue to obtain negative margins or for a lymph node biopsy. The patient was advised that these are the most common complications, but that others can occur as well.  I discussed the Xarelto with Dr. Burr Medico who manages it. She is OK to stop xarelto 4 days pre op.  Current Plans Pt Education - flb breast cancer surgery: discussed with patient and provided information. You are being scheduled for surgery - Our schedulers will call you.  You should hear from our office's scheduling department within 5 working days about the location, date, and time of surgery. We try to make accommodations for patient's preferences in scheduling surgery, but sometimes  the OR schedule or the surgeon's schedule prevents [place] from making those accommodations.  If you have not heard from our office [phone]) in 5 working  days, call the office and ask for your surgeon's nurse.  If you have other questions about your diagnosis, plan, or surgery, call the office and ask for your surgeon's nurse.    Signed by Stark Klein, MD (10/19/2015 11:04 AM)

## 2015-11-07 NOTE — Progress Notes (Signed)
  Assisted Dr. Hodierne with right, ultrasound guided, pectoralis block. Side rails up, monitors on throughout procedure. See vital signs in flow sheet. Tolerated Procedure well. 

## 2015-11-07 NOTE — Anesthesia Preprocedure Evaluation (Signed)
Anesthesia Evaluation  Patient identified by MRN, date of birth, ID band Patient awake    Reviewed: Allergy & Precautions, NPO status , Patient's Chart, lab work & pertinent test results  Airway Mallampati: II   Neck ROM: full    Dental   Pulmonary shortness of breath, PE   breath sounds clear to auscultation       Cardiovascular hypertension, + DVT   Rhythm:regular Rate:Normal     Neuro/Psych Anxiety    GI/Hepatic   Endo/Other  Morbid obesity  Renal/GU      Musculoskeletal   Abdominal   Peds  Hematology   Anesthesia Other Findings   Reproductive/Obstetrics                             Anesthesia Physical Anesthesia Plan  ASA: II  Anesthesia Plan: General and Regional   Post-op Pain Management: MAC Combined w/ Regional for Post-op pain   Induction: Intravenous  Airway Management Planned: LMA  Additional Equipment:   Intra-op Plan:   Post-operative Plan:   Informed Consent: I have reviewed the patients History and Physical, chart, labs and discussed the procedure including the risks, benefits and alternatives for the proposed anesthesia with the patient or authorized representative who has indicated his/her understanding and acceptance.     Plan Discussed with: CRNA, Anesthesiologist and Surgeon  Anesthesia Plan Comments:         Anesthesia Quick Evaluation

## 2015-11-07 NOTE — Anesthesia Postprocedure Evaluation (Signed)
Anesthesia Post Note  Patient: Veronica Reyes  Procedure(s) Performed: Procedure(s) (LRB): BREAST LUMPECTOMY WITH RADIOACTIVE SEED AND SENTINEL LYMPH NODE BIOPSY (Right)  Patient location during evaluation: PACU Anesthesia Type: General Level of consciousness: awake and alert and patient cooperative Pain management: pain level controlled Vital Signs Assessment: post-procedure vital signs reviewed and stable Respiratory status: spontaneous breathing and respiratory function stable Cardiovascular status: stable Anesthetic complications: no    Last Vitals:  Filed Vitals:   11/07/15 1400 11/07/15 1415  BP: 158/94 161/91  Pulse: 68 70  Temp:    Resp: 16 19    Last Pain: There were no vitals filed for this visit.               [place]

## 2015-11-07 NOTE — Anesthesia Procedure Notes (Addendum)
Anesthesia Regional Block:  Pectoralis block  Pre-Anesthetic Checklist: ,, timeout performed, Correct Patient, Correct Site, Correct Laterality, Correct Procedure, Correct Position, site marked, Risks and benefits discussed,  Surgical consent,  Pre-op evaluation,  At surgeon's request and post-op pain management  Laterality: Right  Prep: chloraprep       Needles:  Injection technique: Single-shot  Needle Type: Echogenic Needle     Needle Length: 9cm 9 cm Needle Gauge: 21 and 21 G    Additional Needles:  Procedures: ultrasound guided (picture in chart) Pectoralis block Narrative:  Start time: 11/07/2015 11:01 AM End time: 11/07/2015 11:15 AM Injection made incrementally with aspirations every 5 mL.  Performed by: Personally  Anesthesiologist: HODIERNE, ADAM  Additional Notes: Pt tolerated the procedure well.   Procedure Name: LMA Insertion Date/Time: 11/07/2015 11:56 AM Performed by: Maryella Shivers Pre-anesthesia Checklist: Patient identified, Emergency Drugs available, Suction available and Patient being monitored Patient Re-evaluated:Patient Re-evaluated prior to inductionOxygen Delivery Method: Circle System Utilized Preoxygenation: Pre-oxygenation with 100% oxygen Intubation Type: IV induction Ventilation: Mask ventilation without difficulty LMA: LMA inserted LMA Size: 4.0 Number of attempts: 1 Airway Equipment and Method: Bite block Placement Confirmation: positive ETCO2 and breath sounds checked- equal and bilateral Tube secured with: Tape Dental Injury: Teeth and Oropharynx as per pre-operative assessment

## 2015-11-07 NOTE — Interval H&P Note (Signed)
History and Physical Interval Note:  11/07/2015 11:35 AM  Veronica Reyes  has presented today for surgery, with the diagnosis of RIGHT BREAST CANCER  The various methods of treatment have been discussed with the patient and family. After consideration of risks, benefits and other options for treatment, the patient has consented to  Procedure(s): BREAST LUMPECTOMY WITH RADIOACTIVE SEED AND SENTINEL LYMPH NODE BIOPSY (Right) as a surgical intervention .  The patient's history has been reviewed, patient examined, no change in status, stable for surgery.  I have reviewed the patient's chart and labs.  Questions were answered to the patient's satisfaction.     [name]

## 2015-11-07 NOTE — Discharge Instructions (Addendum)
[facility] °Office Phone Number [phone] ° °BREAST BIOPSY/ PARTIAL MASTECTOMY: POST OP INSTRUCTIONS ° °Always review your discharge instruction sheet given to you by the facility where your surgery was performed. ° °IF YOU HAVE DISABILITY OR FAMILY LEAVE FORMS, YOU MUST BRING THEM TO THE OFFICE FOR PROCESSING.  DO NOT GIVE THEM TO YOUR DOCTOR. ° °1. A prescription for pain medication may be given to you upon discharge.  Take your pain medication as prescribed, if needed.  If narcotic pain medicine is not needed, then you may take acetaminophen (Tylenol) or ibuprofen (Advil) as needed. °2. Take your usually prescribed medications unless otherwise directed °3. If you need a refill on your pain medication, please contact your pharmacy.  They will contact our office to request authorization.  Prescriptions will not be filled after 5pm or on week-ends. °4. You should eat very light the first 24 hours after surgery, such as soup, crackers, pudding, etc.  Resume your normal diet the day after surgery. °5. Most patients will experience some swelling and bruising in the breast.  Ice packs and a good support bra will help.  Swelling and bruising can take several days to resolve.  °6. It is common to experience some constipation if taking pain medication after surgery.  Increasing fluid intake and taking a stool softener will usually help or prevent this problem from occurring.  A mild laxative (Milk of Magnesia or Miralax) should be taken according to package directions if there are no bowel movements after 48 hours. °7. Unless discharge instructions indicate otherwise, you may remove your bandages 48 hours after surgery, and you may shower at that time.  You may have steri-strips (small skin tapes) in place directly over the incision.  These strips should be left on the skin for 7-10 days.   Any sutures or staples will be removed at the office during your follow-up visit. °8. ACTIVITIES:  You may resume  regular daily activities (gradually increasing) beginning the next day.  Wearing a good support bra or sports bra (or the breast binder) minimizes pain and swelling.  You may have sexual intercourse when it is comfortable. °a. You may drive when you no longer are taking prescription pain medication, you can comfortably wear a seatbelt, and you can safely maneuver your car and apply brakes. °b. RETURN TO WORK:  __________1 week_______________ °9. You should see your doctor in the office for a follow-up appointment approximately two weeks after your surgery.  Your doctor’s nurse will typically make your follow-up appointment when she calls you with your pathology report.  Expect your pathology report 2-3 business days after your surgery.  You may call to check if you do not hear from us after three days. ° ° °WHEN TO CALL YOUR DOCTOR: °1. Fever over 101.0 °2. Nausea and/or vomiting. °3. Extreme swelling or bruising. °4. Continued bleeding from incision. °5. Increased pain, redness, or drainage from the incision. ° °The clinic staff is available to answer your questions during regular business hours.  Please don’t hesitate to call and ask to speak to one of the nurses for clinical concerns.  If you have a medical emergency, go to the nearest emergency room or call 911.  A surgeon from [facility] is always on call at the hospital. ° °For further questions, please visit [email or web address]  ° ° °Post Anesthesia Home Care Instructions ° °Activity: °Get plenty of rest for the remainder of the day. A responsible adult should stay with you for 24   hours following the procedure.  °For the next 24 hours, DO NOT: °-Drive a car °-Operate machinery °-Drink alcoholic beverages °-Take any medication unless instructed by your physician °-Make any legal decisions or sign important papers. ° °Meals: °Start with liquid foods such as gelatin or soup. Progress to regular foods as tolerated. Avoid greasy, spicy, heavy  foods. If nausea and/or vomiting occur, drink only clear liquids until the nausea and/or vomiting subsides. Call your physician if vomiting continues. ° °Special Instructions/Symptoms: °Your throat may feel dry or sore from the anesthesia or the breathing tube placed in your throat during surgery. If this causes discomfort, gargle with warm salt water. The discomfort should disappear within 24 hours. ° °If you had a scopolamine patch placed behind your ear for the management of post- operative nausea and/or vomiting: ° °1. The medication in the patch is effective for 72 hours, after which it should be removed.  Wrap patch in a tissue and discard in the trash. Wash hands thoroughly with soap and water. °2. You may remove the patch earlier than 72 hours if you experience unpleasant side effects which may include dry mouth, dizziness or visual disturbances. °3. Avoid touching the patch. Wash your hands with soap and water after contact with the patch. °  ° °

## 2015-11-07 NOTE — Transfer of Care (Signed)
Immediate Anesthesia Transfer of Care Note  Patient: Veronica Reyes  Procedure(s) Performed: Procedure(s): BREAST LUMPECTOMY WITH RADIOACTIVE SEED AND SENTINEL LYMPH NODE BIOPSY (Right)  Patient Location: PACU  Anesthesia Type:GA combined with regional for post-op pain  Level of Consciousness: sedated  Airway & Oxygen Therapy: Patient Spontanous Breathing and Patient connected to nasal cannula oxygen  Post-op Assessment: Report given to RN and Post -op Vital signs reviewed and stable  Post vital signs: Reviewed and stable  Last Vitals:  Filed Vitals:   11/07/15 1120 11/07/15 1124  BP: 123/75   Pulse: 58 58  Temp:    Resp: 16 17    Complications: No apparent anesthesia complications

## 2015-11-07 NOTE — Op Note (Signed)
Right Breast Radioactive seed localized lumpectomy and sentinel lymph node biopsy  Indications: This patient presents with history of colon cancer and breast cancer.  She is s/p sigmoid colectomy and ablation of liver lesion.  She is ready to get breast cancer surgically addressed.    Pre-operative Diagnosis: Stage IA [id number] right breast cancer, ER/PR +, her 2 neg  Post-operative Diagnosis: Same  Surgeon: Stark Klein   Anesthesia: General endotracheal anesthesia  ASA Class: 2  Procedure Details  The patient was seen in the Holding Room. The risks, benefits, complications, treatment options, and expected outcomes were discussed with the patient. The possibilities of bleeding, infection, the need for additional procedures, failure to diagnose a condition, and creating a complication requiring transfusion or operation were discussed with the patient. The patient concurred with the proposed plan, giving informed consent.  The site of surgery properly noted/marked. The patient was taken to Operating Room # 6, identified, and the procedure verified as Right Breast Lumpectomy with sentinel lymph node biopsy. A Time Out was held and the above information confirmed.  The right arm, breast, and chest were prepped and draped in standard fashion. The lumpectomy was performed by creating an transverse incision over the lower outer quadrant of the breast over the previously placed radioactive seed.  Dissection was carried down to around the point of maximum signal intensity. The cautery was used to perform the dissection.  Hemostasis was achieved with cautery. The edges of the cavity were marked with large clips, with one each medial, lateral, inferior and superior, and two clips posteriorly.   The specimen was inked with the margin marker paint kit.    Specimen radiography confirmed inclusion of the mammographic lesion, the clip, and the seed.  The background signal in the breast was zero.  The wound was  irrigated and closed with 3-0 vicryl in layers and 4-0 monocryl subcuticular suture.    Using a hand-held gamma probe, Right axillary sentinel nodes were identified transcutaneously.  An oblique incision was created below the axillary hairline.  Dissection was carried through the clavipectoral fascia. Three level 2 axillary sentinel nodes were removed.  Counts per second were 790, 300, and 280.    The background count was 15 cps.  The wound was irrigated.  Hemostasis was achieved with cautery.  The axillary incision was closed with a 3-0 vicryl deep dermal interrupted sutures and a 4-0 monocryl subcuticular closure.    Sterile dressings were applied. At the end of the operation, all sponge, instrument, and needle counts were correct.  Findings: grossly clear surgical margins and no adenopathy. Anterior margin is skin.    Estimated Blood Loss:  min         Specimens: Right breast lumpectomy and three axillary sentinel lymph nodes.             Complications:  None; patient tolerated the procedure well.         Disposition: PACU - hemodynamically stable.         Condition: stable

## 2015-11-08 ENCOUNTER — Telehealth: Payer: Self-pay | Admitting: *Deleted

## 2015-11-08 ENCOUNTER — Encounter (HOSPITAL_BASED_OUTPATIENT_CLINIC_OR_DEPARTMENT_OTHER): Payer: Self-pay | Admitting: General Surgery

## 2015-11-08 NOTE — Telephone Encounter (Signed)
VM message from patient stating she had surgery yesterday by Dr. [name]. Pt is asking when she can resume her Xarelto.  Please advise.

## 2015-11-08 NOTE — Telephone Encounter (Signed)
Veronica Reyes,  Please see her message below. Is it OK to resume Xarelto tomorrow?   Thanks,  [name]  11/08/2015

## 2015-11-09 NOTE — Progress Notes (Signed)
Quick Note:  Please let patient know margins and LN are negative!! ______

## 2015-11-13 ENCOUNTER — Other Ambulatory Visit: Payer: Self-pay | Admitting: Hematology

## 2015-11-13 NOTE — Progress Notes (Signed)
Quick Note:  Please let patietn know that LN and margins are negative. ______

## 2015-11-15 ENCOUNTER — Other Ambulatory Visit: Payer: Self-pay | Admitting: *Deleted

## 2015-11-15 DIAGNOSIS — I2699 Other pulmonary embolism without acute cor pulmonale: Secondary | ICD-10-CM

## 2015-11-15 MED ORDER — RIVAROXABAN 20 MG PO TABS: 20.0000 mg | ORAL_TABLET | Freq: Every day | ORAL | Status: DC

## 2015-11-27 ENCOUNTER — Telehealth: Payer: Self-pay | Admitting: *Deleted

## 2015-11-27 NOTE — Telephone Encounter (Signed)
Ordered Oncotype Dx test per Dr. Burr Medico.  Faxed PA to [facility]. Faxed order to [facility] and Faxed order to [facility].  Called Christy at [facility] to make her aware.

## 2015-12-06 ENCOUNTER — Ambulatory Visit (HOSPITAL_BASED_OUTPATIENT_CLINIC_OR_DEPARTMENT_OTHER): Payer: BLUE CROSS/BLUE SHIELD | Admitting: Hematology

## 2015-12-06 ENCOUNTER — Encounter: Payer: Self-pay | Admitting: Hematology

## 2015-12-06 ENCOUNTER — Encounter (HOSPITAL_COMMUNITY): Payer: Self-pay

## 2015-12-06 ENCOUNTER — Telehealth: Payer: Self-pay | Admitting: Hematology

## 2015-12-06 VITALS — BP 146/83 | HR 59 | Temp 99.0°F | Resp 18 | Ht 66.0 in | Wt 283.8 lb

## 2015-12-06 DIAGNOSIS — Z86000 Personal history of in-situ neoplasm of breast: Secondary | ICD-10-CM | POA: Diagnosis not present

## 2015-12-06 DIAGNOSIS — C187 Malignant neoplasm of sigmoid colon: Secondary | ICD-10-CM | POA: Diagnosis not present

## 2015-12-06 DIAGNOSIS — C50511 Malignant neoplasm of lower-outer quadrant of right female breast: Secondary | ICD-10-CM | POA: Diagnosis not present

## 2015-12-06 DIAGNOSIS — C787 Secondary malignant neoplasm of liver and intrahepatic bile duct: Secondary | ICD-10-CM

## 2015-12-06 NOTE — Telephone Encounter (Signed)
Gave and printed appt sched and avs for pt for March and May

## 2015-12-06 NOTE — Progress Notes (Signed)
[place] ONCOLOGY OFFICE PROGRESS NOTE DATE OF VISIT: 12/06/2015   Veronica Drafts, FNP [address] 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 Left breast DCIS diagnosed in 2006 and 2010, status post lumpectomy. 2. pT2N0M0 stage I colon cancer, s/p partial colectomy on 03/09/2014  3. PE in 12/2013 when she was diagnosed with colon cancer. 4. 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 [facility])   Staging form: Breast, AJCC 7th Edition     Clinical stage from 02/01/2015: Stage IA (T1a, N0, M0) - Signed by [name], MD on 06/04/2015     Pathologic stage from 11/10/2015: Stage IA (T1b, N0, cM0) - Signed by [name], MD on 12/08/2015 Cancer of sigmoid colon [facility])   Staging form: Colon and Rectum, AJCC 7th Edition     Clinical: Stage I (T2, N0, M0) - Unsigned          Breast cancer of lower-outer quadrant of right female breast ([place])   2006 Cancer Diagnosis Left breast DCIS diagnosed in 2006 and 2010, status post lumpectomy.   01/07/2014 Initial Diagnosis Breast cancer   01/18/2015 Mammogram 45m mass in lower outer quadrant of right breast    02/01/2015 Initial Biopsy right breast mass biopsy showed invasive ductal carcinoma    02/01/2015 Receptors her2 ER 90%+, PR 10%+, kI67 10%, HER2 pending    11/10/2015 Surgery Left breast lumpectomy and sentinel lymph node biopsy   11/10/2015 Pathology Results Left breast lumpectomy showed invasive ductal carcinoma, grade 2, 1 cm, low-grade DCIS, negative margins, 3 sentinel lymph nodes were negative. LVI (-)    11/10/2015 Oncotype testing RS 22, which predicts a year risk of distant recurrence with tamoxifen alone 14%. Adjuvant chemo was not offered    [place]   03/04/2014 Initial Diagnosis Cancer of  sigmoid colon   03/09/2014 Pathology Results G1 invasive adenocarcinoma, no lymph-Vascular invasion or Peri-neural invasion: Absent. MMR normal, MSI stable.    03/09/2014 Surgery partial colectomy, negative margins.    11/10/2014 Relapse/Recurrence biopsy confirmed oligo liver recurrence    11/10/2014 Imaging PET showed two small ares of hypermetabolism in the right lobe of the liver, no other distant metastasis.    11/30/2014 Imaging abdomen MRI showed a new enhancing lesion which corresponds to an area of abnormal hyper metabolism on recent PET-CT. Stable enlarged portal caval lymph node.    01/02/2015 Pathology Results Liver biopsy showed metastatic adenocarcinoma, IHC (+) for CK20, CDX-2, (-) for TTF-1 and ER   01/02/2015 Miscellaneous liver biopsy KRAS mutation (+)    02/14/2015 Imaging CT showed:  the small metastatic right hepatic lobe liver lesion is not identified for certain on this examination. No new lesions. Stable small cysts in segment 4 a.    03/05/2015 - 04/16/2015 Chemotherapy CAPEOX (capecitabine 2500 mg twice daily Day 1-14, oxaliplatin 1 30 mg/m on day 1, every 21 days, S/P 3 cycles    08/24/2015 Procedure CT-guided oligo liver metastasis microwave ablation by Dr. WBobetta Lime    CURRENT TREATMENT:  Recovery from surgery   INTERVAL HISTORY:  Veronica DILWORTH[age]y.o. female with a history of recurrent left breast cancer, colon cancer s/p laparoscopic-assisted partial colectomy (03/09/2014), PE (01/06/2014), and right breast cancer (01/2015),  presents to clinic for follow up.  She underwent right breast lumpectomy and sentinel lymph node biopsy on 11/06/2015. She tolerated the surgery very well, has recovered well. She feels well, denies any pain, or other symptoms.  MEDICAL HISTORY: Past Medical History  Diagnosis Date  . Hypertension   . Shortness of breath     sob with exertion. dx. with Pulmonary emboli 4'15 ,tx. Xarelto,Lovenox used for [employer].  . Pulmonary embolism ([place])      "blood clot in lungs" -dx. 4'15 with CT Chest. On Xarelto  . Liver lesion   . Cancer [facility])     left breast cancer x2  . Breast CA ([place])     radiation and surgery-lt.  . Colon cancer [facility])     Dr. Burr Medico seeing monthly  . Anxiety     ALLERGIES:  is allergic to tramadol.  MEDICATIONS:    Medication List       This list is accurate as of: 12/06/15  8:32 AM.  Always use your most recent med list.               amLODipine 2.5 MG tablet  Commonly known as:  NORVASC  Take 2.5 mg by mouth daily.     ferrous sulfate 325 (65 FE) MG tablet  Take 325 mg by mouth daily with breakfast.     hydrochlorothiazide 25 MG tablet  Commonly known as:  HYDRODIURIL  Take 25 mg by mouth every morning.     HYDROcodone-acetaminophen 5-325 MG tablet  Commonly known as:  NORCO/VICODIN  Take 1-2 tablets by mouth every 6 (six) hours as needed for moderate pain.     metoprolol 50 MG tablet  Commonly known as:  LOPRESSOR  Take 50 mg by mouth 2 (two) times daily.     oxyCODONE 5 MG immediate release tablet  Commonly known as:  Oxy IR/ROXICODONE  Take 1-2 tablets (5-10 mg total) by mouth every 6 (six) hours as needed for moderate pain, severe pain or breakthrough pain.     potassium chloride SA 20 MEQ tablet  Commonly known as:  K-DUR,KLOR-CON  Take 1 tablet (20 mEq total) by mouth daily.     rivaroxaban 20 MG Tabs tablet  Commonly known as:  XARELTO  Take 1 tablet (20 mg total) by mouth daily with supper.        SURGICAL HISTORY:  Past Surgical History  Procedure Laterality Date  . Cesarean section    . Breast surgery      '11- Left breast lumpectomy  . Cholecystectomy      Laparoscopic 20 yrs ago  . Partial colectomy N/A 03/09/2014    Procedure: LAPAROSCOPIC ASSISTED PARTIAL COLECTOMY, splenic flexure mobilization;  Surgeon: Leighton Ruff, MD;  Location: [facility];  Service: General;  Laterality: N/A;  . Radiofrequency ablation Right 08/24/2015    Procedure: MICROWAVE ABLATION RIGHT LIVER  LOBE ;  Surgeon: Corrie Mckusick, DO;  Location: [facility];  Service: Anesthesiology;  Laterality: Right;  . Colon surgery  03-09-14    partial colectomy for cancer  . Breast lumpectomy with radioactive seed and sentinel lymph node biopsy Right 11/07/2015    Procedure: BREAST LUMPECTOMY WITH RADIOACTIVE SEED AND SENTINEL LYMPH NODE BIOPSY;  Surgeon: Stark Klein, MD;  Location: [place];  Service: General;  Laterality: Right;    REVIEW OF SYSTEMS:   Constitutional: Denies fevers, chills or abnormal weight loss Eyes: Denies blurriness of vision Ears, nose, mouth, throat, and face: Denies mucositis or sore throat Respiratory: Denies cough, dyspnea or wheezes Cardiovascular:  Denies palpitation, chest discomfort or lower extremity swelling Gastrointestinal:  Denies nausea, heartburn or change in bowel habits Skin: Denies abnormal skin rashes Lymphatics: Denies new lymphadenopathy or easy bruising Neurological:Denies numbness, tingling or new weaknesses Behavioral/Psych: Mood is stable, no new changes  All other systems were reviewed with the patient and are negative. Breasts: Breast inspection showed them to be symmetrical with no nipple discharge. Palpation of the breasts and axilla revealed no obvious mass that I could appreciate.   Old surgical scar in the left breast. (+)  Skin erythema below both breasts   PHYSICAL EXAMINATION: ECOG PERFORMANCE STATUS: 1 Blood pressure 146/83, pulse 59, temperature 99 F (37.2 C), temperature source Oral, resp. rate 18, height '5\' 6"'  (1.676 m), weight 283 lb 12.8 oz (128.731 kg), last menstrual period 01/23/2014, SpO2 99 %. GENERAL:alert, no distress and comfortable; well developed and obese. Easily mobile to exam table.  SKIN: skin color, texture, turgor are normal, no rashes or significant lesions EYES: normal, Conjunctiva are pink and non-injected, sclera clear OROPHARYNX:no exudate, no erythema and lips, buccal mucosa, and tongue normal   NECK: supple, thyroid normal size, non-tender, without nodularity LYMPH:  no palpable lymphadenopathy in the cervical, axillary or supraclavicular LUNGS: clear to auscultation with normal breathing effort, no wheezes or rhonchi HEART: regular rate & rhythm and no murmurs and no lower extremity edema ABDOMEN:abdomen soft, non-tender and normal bowel sounds; incision healing with signs of infection. Rectal exam negative for palpable lesion, no blood on glove  Musculoskeletal:no cyanosis of digits and no clubbing  NEURO: alert & oriented x 3 with fluent speech, no focal motor/sensory deficits Breasts: Breast inspection showed them to be symmetrical with no nipple discharge. The surgical scar in her right breast and axilla have healed very well. Palpation of the breasts and axilla revealed no obvious mass that I could appreciate.   Labs:  CBC Latest Ref Rng 10/04/2015 08/22/2015 05/14/2015  WBC 3.9 - 10.3 10e3/uL 8.5 8.9 6.5  Hemoglobin 11.6 - 15.9 g/dL 14.7 14.1 13.5  Hematocrit 34.8 - 46.6 % 43.1 41.8 39.6  Platelets 145 - 400 10e3/uL 228 226 152    CMP Latest Ref Rng 11/02/2015 10/04/2015 08/22/2015  Glucose 65 - 99 mg/dL 94 91 101(H)  BUN 6 - 20 mg/dL 7 10.8 8  Creatinine 0.44 - 1.00 mg/dL 0.80 0.9 0.89  Sodium 135 - 145 mmol/L 140 140 140  Potassium 3.5 - 5.1 mmol/L 4.2 3.5 4.3  Chloride 101 - 111 mmol/L 103 - 105  CO2 22 - 32 mmol/L '28 29 29  ' Calcium 8.9 - 10.3 mg/dL 9.4 9.9 9.8  Total Protein 6.4 - 8.3 g/dL - 8.5(H) 8.5(H)  Total Bilirubin 0.20 - 1.20 mg/dL - 0.65 1.0  Alkaline Phos 40 - 150 U/L - 52 43  AST 5 - 34 U/L - 34 40  ALT 0 - 55 U/L - 30 35    PATHOLOGY RESULT Diagnosis 01/02/2015 Liver, needle/core biopsy, right - POSITIVE FOR METASTATIC ADENOCARCINOMA. - SEE COMMENT. Microscopic Comment Immunohistochemical stains are performed. The tumor is positive for cytokeratin 20 and CDX-2. Cytokeratin 7 stains background bile ducts but is negative within the tumor. The tumor  is negative for TTF-1 and estrogen receptor. The morphology coupled with the staining pattern is consistent with metastatic adenocarcinoma of colorectal primary source.   Diagnosis 11/07/2015 1. Breast, lumpectomy, Right - INVASIVE DUCTAL CARCINOMA, GRADE 2, SPANNING 1 CM. - DUCTAL CARCINOMA IN SITU, LOW GRADE. - RESECTION MARGINS ARE NEGATIVE FOR INVASIVE CARCINOMA. - DUCTAL  CARCINOMA IN SITU COMES TO WITHIN 0.3 CM OF THE POSTERIOR MARGIN. - BIOPSY SITE. - SEE ONCOLOGY TABLE. 2. Lymph node, sentinel, biopsy, right axillary #1 - ONE OF ONE LYMPH NODES NEGATIVE FOR CARCINOMA (0/1). 3. Lymph node, sentinel, biopsy, right axillary #2 - ONE OF ONE LYMPH NODES NEGATIVE FOR CARCINOMA (0/1). 4. Lymph node, sentinel, biopsy, right axillary #3 - ONE OF ONE LYMPH NODES NEGATIVE FOR CARCINOMA (0/1). Microscopic Comment 1. BREAST, INVASIVE TUMOR, WITH LYMPH NODES PRESENT Specimen, including laterality and lymph node sampling (sentinel, non-sentinel): Right breast lump and right axillary sentinel lymph nodes. Procedure: Right breast lumpectomy and right axillary sentinel lymph node biopsy. Histologic type: Invasive ductal carcinoma. Grade: 2. Tubule formation: 3 Nuclear pleomorphism: 2 Mitotic:1 Tumor size (gross measurement): 1 cm Margins: Invasive, distance to closest margin: 0.2 cm (posterior). In-situ, distance to closest margin: 0.3 cm (posterior, focal). If margin positive, focally or broadly: N/A. Lymphovascular invasion: Not identified. Ductal carcinoma in situ: Present. Grade: I Extensive intraductal component: No. Lobular neoplasia: Not identified. Tumor focality: Unifocal. Treatment effect: N/A. Extent of tumor: Confined to breast parenchyma. Lymph nodes: Examined: 3 Sentinel 0 Non-sentinel 3 Total Lymph nodes with metastasis: 0 Isolated tumor cells (< 0.2 mm): 0 Micrometastasis: (> 0.2 mm and < 2.0 mm): 0 Macrometastasis: (> 2.0 mm): 0 Extracapsular extension:  N/A. Breast prognostic profile: Performed on biopsy ([id number]), see below. Her2 will be repeated on the current specimen. Estrogen receptor: Positive, 90%, strong staining intensity. Progesterone receptor: Positive, 10%, strong staining intensity. Her 2 neu: Negative. Ki-67: 10%. Non-neoplastic breast: Fibrocystic change and usual ductal hyperplasia. Biopsy site. TNM: pT1b, pN0 Comments: None  ONCOTYPE DX: RS 22, which predicts a year risk of distant recurrence with tamoxifen alone 14%  Results: IMMUNOHISTOCHEMICAL AND MORPHOMETRIC ANALYSIS BY THE AUTOMATED CELLULAR IMAGING SYSTEM (ACIS) Estrogen Receptor: 90%, POSITIVE, STRONG STAINING INTENSITY (PERFORMED MANUALLY) Progesterone Receptor: 10%, POSITIVE, STRONG STAINING INTENSITY (PERFORMED MANUALLY) Proliferation Marker Ki67: 10% 1. FLUORESCENCE IN-SITU HYBRIDIZATION Results: HER2 - NEGATIVE RATIO OF HER2/CEP17 SIGNALS 1.29 AVERAGE HER2 COPY NUMBER PER CELL 1.80  RADIOLOGY STUDIES   Abdomen MRI w wo contrast 05/29/2015  IMPRESSION: 1. Mild degradation secondary to patient body habitus. 2. Isolated segment 6 subcapsular liver lesion is similar. No new or progressive disease identified. 3. Interpolar right renal lesion is favored to represent a complex/septated cyst and is not significantly changed in size.  CT guided tissue ablation by Dr. Earleen Newport 08/24/2015 IMPRESSION: Status post image guided microwave ablation of solitary metastasis in the right liver lobe in this patient with oligo metastatic colorectal cancer.  ASSESSMENT: Veronica Reyes 60 y.o. female with a history of  1. New right breast cancer, pT1bN0M0, stage Ia, ER+/PR+/HER2-, History of left Breast Cancer, diagnosed on 02/01/2015 --Left CIS in situ, s/p lumpectomy in 2006 and 2011 by Dr. Margot Chimes, and radiation in 2011 by Dr. Sondra Come.  -I reviewed her surgical pathology results from 11/10/2015, which showed a 1 cm grade 2 invasive ductal carcinoma and DCIS.  Surgical margins were negative. -I reviewed her Oncotype DX result. Her recurrence score is 22, which predicts 14% 10 year risk of distant recurrence with tamoxifen alone. This is intermediate risk, based on her relatively low number, I do not recommend adjuvant chemotherapy. -Giving the strong ER/PR positivity, and previous multiple breast cancer, I strongly recommend her to take aromatase inhibitor as adjuvant endocrine therapy. This will start after her radiation. Potential side effects were reviewed with her, she is agreeable.  2.Colon Cancer, Stage I (pT2N0), MMR normal,  oligo recurrent disease in liver in 12/2014, KRAS mutation (+) -I previously reviewed the nature history of metastatic colon cancer, and the potential curative approach with chemotherapy followed by liver lesion ablation or resection. However, more likely, this is not curable disease. -She is status post 3 cycles of Capeox, does not want to proceed with fourth cycle due to the moderate side effects from previous chemotherapy treatment. She declined liver mass resection, and underwent liver ablation by interventional radiologist Dr. Earleen Newport in 08/2015. -Restaging CT and/or liver MRI, I'll coordinate with Dr. Earleen Newport for the scan orders  -repeat lab   3. History of PE (01/06/2014) --She has a diagnosis of pulmonary embolism, likely secondary to malignancy in the setting of family history for stroke (and possible stroke history in patient as well). Lower extremity dopplers negative.  -continue Xarelto. Patient asked about if she can come off Xarelto. If she has no evidence of cancer in 3-6 months, I can potentially take her off Xarelto. She understands she has moderate to high risk of recurrent thrombosis if she is off anticoagulation.  4.  Uterine Fibroids  --Negative endometrial biopsy. Following with Gynecology.  5. Kidney lesion (Right). -No hypermetabolic right kidney lesion on the pet scan -Repeat abdominal MRI 05/29/2015  showed unchanged right renal lesion, favored to represent a complex/septated cyst.  Plan  -rad/onc referral for breast radiation -restaging scan, will coordinate with Dr. Earleen Newport -I will see her back in 2 months after, or sooner if scan is abnormal    All questions were answered. The patient knows to call the clinic with any problems, questions or concerns. We can certainly see the patient much sooner if necessary.  I spent 25 minutes counseling the patient face to face. The total time spent in the appointment was 30 minutes.   [name]  12/06/2015

## 2015-12-10 NOTE — Addendum Note (Signed)
Addended by: [name] on: 12/10/2015 12:37 PM   Modules accepted: Orders

## 2015-12-11 ENCOUNTER — Other Ambulatory Visit: Payer: Self-pay | Admitting: Interventional Radiology

## 2015-12-11 ENCOUNTER — Other Ambulatory Visit: Payer: Self-pay | Admitting: Hematology

## 2015-12-11 DIAGNOSIS — C19 Malignant neoplasm of rectosigmoid junction: Secondary | ICD-10-CM

## 2015-12-11 DIAGNOSIS — K769 Liver disease, unspecified: Secondary | ICD-10-CM

## 2015-12-12 ENCOUNTER — Other Ambulatory Visit: Payer: Self-pay | Admitting: *Deleted

## 2015-12-12 DIAGNOSIS — I2699 Other pulmonary embolism without acute cor pulmonale: Secondary | ICD-10-CM

## 2015-12-12 MED ORDER — RIVAROXABAN 20 MG PO TABS: 20.0000 mg | ORAL_TABLET | Freq: Every day | ORAL | Status: DC

## 2015-12-13 ENCOUNTER — Other Ambulatory Visit (HOSPITAL_BASED_OUTPATIENT_CLINIC_OR_DEPARTMENT_OTHER): Payer: BLUE CROSS/BLUE SHIELD

## 2015-12-13 DIAGNOSIS — C187 Malignant neoplasm of sigmoid colon: Secondary | ICD-10-CM

## 2015-12-13 LAB — COMPREHENSIVE METABOLIC PANEL
ALT: 36 U/L (ref 0–55)
ANION GAP: 7 meq/L (ref 3–11)
AST: 41 U/L — ABNORMAL HIGH (ref 5–34)
Albumin: 3.5 g/dL (ref 3.5–5.0)
Alkaline Phosphatase: 47 U/L (ref 40–150)
BUN: 10.8 mg/dL (ref 7.0–26.0)
CALCIUM: 9.7 mg/dL (ref 8.4–10.4)
CHLORIDE: 105 meq/L (ref 98–109)
CO2: 28 meq/L (ref 22–29)
Creatinine: 0.9 mg/dL (ref 0.6–1.1)
EGFR: 79 mL/min/{1.73_m2} — AB (ref >90)
Glucose: 105 mg/dl (ref 70–140)
POTASSIUM: 3.6 meq/L (ref 3.5–5.1)
Sodium: 140 mEq/L (ref 136–145)
Total Bilirubin: 0.62 mg/dL (ref 0.20–1.20)
Total Protein: 8.1 g/dL (ref 6.4–8.3)

## 2015-12-13 LAB — CBC & DIFF AND RETIC
BASO%: 0.2 % (ref 0.0–2.0)
BASOS ABS: 0 10*3/uL (ref 0.0–0.1)
EOS ABS: 0.1 10*3/uL (ref 0.0–0.5)
EOS%: 0.6 % (ref 0.0–7.0)
HCT: 43.2 % (ref 34.8–46.6)
HGB: 14.7 g/dL (ref 11.6–15.9)
Immature Retic Fract: 3.1 % (ref 1.60–10.00)
LYMPH#: 3.6 10*3/uL — AB (ref 0.9–3.3)
LYMPH%: 44.8 % (ref 14.0–49.7)
MCH: 32.3 pg (ref 25.1–34.0)
MCHC: 34 g/dL (ref 31.5–36.0)
MCV: 94.9 fL (ref 79.5–101.0)
MONO#: 0.5 10*3/uL (ref 0.1–0.9)
MONO%: 6 % (ref 0.0–14.0)
NEUT#: 3.9 10*3/uL (ref 1.5–6.5)
NEUT%: 48.4 % (ref 38.4–76.8)
PLATELETS: 214 10*3/uL (ref 145–400)
RBC: 4.55 10*6/uL (ref 3.70–5.45)
RDW: 14.1 % (ref 11.2–14.5)
RETIC CT ABS: 87.36 10*3/uL (ref 33.70–90.70)
Retic %: 1.92 % (ref 0.70–2.10)
WBC: 8.1 10*3/uL (ref 3.9–10.3)

## 2015-12-18 ENCOUNTER — Encounter (HOSPITAL_COMMUNITY): Payer: Self-pay

## 2015-12-18 ENCOUNTER — Other Ambulatory Visit: Payer: Self-pay | Admitting: Hematology

## 2015-12-18 ENCOUNTER — Ambulatory Visit (HOSPITAL_COMMUNITY)
Admission: RE | Admit: 2015-12-18 | Discharge: 2015-12-18 | Disposition: A | Discharge status: Home / Self Care | Payer: BLUE CROSS/BLUE SHIELD | Source: Ambulatory Visit | Attending: Hematology | Admitting: Hematology

## 2015-12-18 DIAGNOSIS — N852 Hypertrophy of uterus: Secondary | ICD-10-CM | POA: Diagnosis not present

## 2015-12-18 DIAGNOSIS — C187 Malignant neoplasm of sigmoid colon: Secondary | ICD-10-CM | POA: Diagnosis present

## 2015-12-18 DIAGNOSIS — R911 Solitary pulmonary nodule: Secondary | ICD-10-CM | POA: Insufficient documentation

## 2015-12-18 DIAGNOSIS — D259 Leiomyoma of uterus, unspecified: Secondary | ICD-10-CM | POA: Diagnosis not present

## 2015-12-18 DIAGNOSIS — C787 Secondary malignant neoplasm of liver and intrahepatic bile duct: Secondary | ICD-10-CM | POA: Diagnosis not present

## 2015-12-18 MED ORDER — IOPAMIDOL (ISOVUE-300) INJECTION 61%
100.0000 mL | Freq: Once | INTRAVENOUS | Status: AC | PRN
  Administered 2015-12-18: 100 mL via INTRAVENOUS

## 2015-12-27 ENCOUNTER — Ambulatory Visit: Payer: BLUE CROSS/BLUE SHIELD | Admitting: Radiation Oncology

## 2015-12-27 ENCOUNTER — Ambulatory Visit: Payer: BLUE CROSS/BLUE SHIELD

## 2016-01-11 ENCOUNTER — Encounter: Payer: Self-pay | Admitting: Oncology

## 2016-01-11 NOTE — Progress Notes (Signed)
Location of Breast Cancer: Breast cancer of lower-outer quadrant of right female breast    Histology per Pathology Report:   11/07/15 Diagnosis 1. Breast, lumpectomy, Right - INVASIVE DUCTAL CARCINOMA, GRADE 2, SPANNING 1 CM. - DUCTAL CARCINOMA IN SITU, LOW GRADE. - RESECTION MARGINS ARE NEGATIVE FOR INVASIVE CARCINOMA. - DUCTAL CARCINOMA IN SITU COMES TO WITHIN 0.3 CM OF THE POSTERIOR MARGIN. - BIOPSY SITE. - SEE ONCOLOGY TABLE. 2. Lymph node, sentinel, biopsy, right axillary #1 - ONE OF ONE LYMPH NODES NEGATIVE FOR CARCINOMA (0/1). 3. Lymph node, sentinel, biopsy, right axillary #2 - ONE OF ONE LYMPH NODES NEGATIVE FOR CARCINOMA (0/1). 4. Lymph node, sentinel, biopsy, right axillary #3 - ONE OF ONE LYMPH NODES NEGATIVE FOR CARCINOMA (0/1).  02/01/15 Diagnosis Breast, right, needle core biopsy, LOQ - INVASIVE DUCTAL CARCINOMA, SEE COMMENT. - DUCTAL CARCINOMA IN SITU.  Receptor Status: ER(90%), PR (10%), Her2-neu (negative)  Did patient present with symptoms (if so, please note symptoms) or was this found on screening mammography?: abnormal mammogram  Past/Anticipated interventions by surgeon, if any: 11/07/15 - Procedure: BREAST LUMPECTOMY WITH RADIOACTIVE SEED AND SENTINEL LYMPH NODE BIOPSY;  Surgeon: Stark Klein, MD;  Location: [facility];  Service: General;  Laterality: Right  Past/Anticipated interventions by medical oncology, if any: aromatase inhibitor as adjuvant endocrine therapy to start after her radiation.  Lymphedema issues, if any:  no  Pain issues, if any:  yes has pain in her back from a past ablation  SAFETY ISSUES:  Prior radiation? 01/22/10-03/12/10 left whole breast 4500 cGy, upper aspect boosted to 6120 cGy  Pacemaker/ICD? no  Possible current pregnancy?no  Is the patient on methotrexate? no  Current Complaints / other details:  Patient reports she also has colon cancer with a met to her liver.  BP 147/78 mmHg  Pulse 64  Temp(Src)  98.4 F (36.9 C) (Oral)  Resp 16  Ht '5\' 6"'  (1.676 m)  Wt 289 lb 1.6 oz (131.135 kg)  BMI 46.68 kg/m2  LMP 01/23/2014    Jacqulyn Liner, RN 01/11/2016,12:03 PM

## 2016-01-16 ENCOUNTER — Ambulatory Visit
Admission: RE | Admit: 2016-01-16 | Discharge: 2016-01-16 | Disposition: A | Discharge status: Home / Self Care | Payer: BLUE CROSS/BLUE SHIELD | Source: Ambulatory Visit | Attending: Radiation Oncology | Admitting: Radiation Oncology

## 2016-01-16 ENCOUNTER — Other Ambulatory Visit: Payer: Self-pay | Admitting: Hematology

## 2016-01-16 VITALS — BP 147/78 | HR 64 | Temp 98.4°F | Resp 16 | Ht 66.0 in | Wt 289.1 lb

## 2016-01-16 DIAGNOSIS — Z510 Encounter for antineoplastic radiation therapy: Secondary | ICD-10-CM | POA: Insufficient documentation

## 2016-01-16 DIAGNOSIS — C50511 Malignant neoplasm of lower-outer quadrant of right female breast: Secondary | ICD-10-CM

## 2016-01-16 DIAGNOSIS — Z170 Estrogen receptor positive status [ER+]: Secondary | ICD-10-CM | POA: Insufficient documentation

## 2016-01-16 NOTE — Progress Notes (Signed)
Radiation Oncology         [phone] ________________________________  Re-Consultation Note  Name: Veronica Reyes MRN: [id number]  Date: 01/16/2016  DOB: [birth year]  GY:[id number] Veronica Loosen, FNP  [name], MD   REFERRING PHYSICIAN: [name], MD  DIAGNOSIS: The encounter diagnosis was Breast cancer of lower-outer quadrant of right female breast [facility]).   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 female who has a prior medical history of left breast DCIS s/p lumpectomy in 2006 and 2011 by Dr. Margot Chimes and radiation in 2011 by myself and pT2N0M0 stage I colon cancer, s/p partial colectomy on 03/09/2014.  PET on 11/10/14 showed two small areas of hypermetabolism in the right lobe of the liver and abdominal MRI on 11/30/14 showed a new enhancing liver lesion corresponding to an area of hypermetabolism on the PET scan. Liver biopsy on 01/02/15 revealed metastatic adenocarcinoma, positive for CK20 and CDX-2, negative for TTF-1 and ER. KRAS mutation analysis detected a Gly12Cys mutation; therefore, she will not benefit from EGFR targeted therapy. The patient underwent chemotherapy with CAPEOX (capecitabine 2500 mg twice daily Day 1-14, oxaliplatin 1 30 mg/m on day 1, every 21 days, S/P 3 cycles) 03/05/15 - 04/16/2015 by Dr. Burr Medico (the patient did not want to proceed any further with additional cycles due to moderate side effects) and a CT-guided oligo liver metastasis microwave ablation.  During this time, screening mammogram on 01/18/15 found a 3 mm mass in the LOQ of the right breast with associated architectural distortion. Ultrasound did not sonographically correlate with the mammogram and did not show any axillary adenopathy. Biopsy of the LOQ of the right breast revealed grade I-II invasive ductal carcinoma (ER positive 90%, PR positive 10%, HER2 negative, Ki67 10%) and DCIS. Given the  patient's concurrent metastatic cancer to the liver and insurance issues, the patient preferred no evaluation of this issue.  The patient had a repeat mammogram and ultrasound on 10/12/15 with the malignancy of the right breast appearing overall unchanged, measuring around 0.5 cm.  The patient underwent a right breast lumpectomy and right axillary sentinel lymph node biopsies on 11/07/15. This showed grade 2 invasive ductal carcinoma, measuring 1 cm, and the resection margins were negative for invasive carcinoma. Also noted was grade 1 DCIS that comes to within 0.3 cm of the posterior margin. Three lymph nodes biopsied at that time were negative for malignancy. The patient's Oncotype DX recurrence score was 22; which predicts 14% 10 year risk of distant recurrence with tamoxifen alone. This is intermediate risk, and based on her relatively low number, Dr. Grant Ruts did not recommend adjuvant chemotherapy. However, given the strong ER/PR positivity, and previous multiple breast cancer, she strongly recommended the patient to take an aromatase inhibitor as adjuvant endocrine therapy.  CT of the chest/abd/pelvis shows no evidence of residual or progressive metastatic disease and a stable 3 mm left lower lobe pulmonary nodule.  The patient presents today to discuss the role of radiation in the management of her disease.  PREVIOUS RADIATION THERAPY: Yes. 01/22/10-03/12/10 left whole breast 4500 cGy, upper aspect boosted to 6120 cGy  PAST MEDICAL HISTORY:  has a past medical history of Hypertension; Shortness of breath; Pulmonary embolism ([place]); Liver lesion; Cancer ([place]); Breast CA ([place]); Colon cancer ([place]); Anxiety; and Radiation (01/22/10-03/12/10).    PAST SURGICAL HISTORY: Past Surgical History  Procedure Laterality Date  . Cesarean section    .  Breast surgery      '11- Left breast lumpectomy  . Cholecystectomy      Laparoscopic 20 yrs ago  . Partial colectomy N/A 03/09/2014    Procedure: LAPAROSCOPIC  ASSISTED PARTIAL COLECTOMY, splenic flexure mobilization;  Surgeon: Leighton Ruff, MD;  Location: [facility];  Service: General;  Laterality: N/A;  . Radiofrequency ablation Right 08/24/2015    Procedure: MICROWAVE ABLATION RIGHT LIVER LOBE ;  Surgeon: Corrie Mckusick, DO;  Location: [facility];  Service: Anesthesiology;  Laterality: Right;  . Colon surgery  03-09-14    partial colectomy for cancer  . Breast lumpectomy with radioactive seed and sentinel lymph node biopsy Right 11/07/2015    Procedure: BREAST LUMPECTOMY WITH RADIOACTIVE SEED AND SENTINEL LYMPH NODE BIOPSY;  Surgeon: Stark Klein, MD;  Location: [place];  Service: General;  Laterality: Right;    FAMILY HISTORY: family history includes Cancer in her cousin; Hypertension in her sister.  SOCIAL HISTORY:  reports that she has never smoked. She has never used smokeless tobacco. She reports that she drinks alcohol. She reports that she does not use illicit drugs.  ALLERGIES: Tramadol  MEDICATIONS:  Current Outpatient Prescriptions  Medication Sig Dispense Refill  . amLODipine (NORVASC) 2.5 MG tablet Take 2.5 mg by mouth daily.  2  . ferrous sulfate 325 (65 FE) MG tablet Take 325 mg by mouth daily with breakfast.     . hydrochlorothiazide (HYDRODIURIL) 25 MG tablet Take 25 mg by mouth every morning.     . metoprolol (LOPRESSOR) 50 MG tablet Take 50 mg by mouth 2 (two) times daily.    . potassium chloride SA (K-DUR,KLOR-CON) 20 MEQ tablet Take 1 tablet (20 mEq total) by mouth daily. 30 tablet 0  . XARELTO 20 MG TABS tablet Take 1 tablet by mouth daily with supper 30 tablet 0   No current facility-administered medications for this encounter.    REVIEW OF SYSTEMS:  A 15 point review of systems is documented in the electronic medical record. This was obtained by the nursing staff. However, I reviewed this with the patient to discuss relevant findings and make appropriate changes.  Denies breast pain, nipple discharge, or  bleeding. She states being able to raise her arms with minimal difficulty.   PHYSICAL EXAM:  height is '5\' 6"'  (1.676 m) and weight is 289 lb 1.6 oz (131.135 kg). Her oral temperature is 98.4 F (36.9 C). Her blood pressure is 147/78 and her pulse is 64. Her respiration is 16.   Lungs are clear to auscultation bilaterally. Heart has regular rate and rhythm. No palpable cervical, supraclavicular, or axillary adenopathy. Abdomen soft, non-tender, normal bowel sounds.  Left breast shows mild hyperpigmenation changes since her last radiation treatment. No palpable mass or nipple discharge. Right breast shows a scar in the inferior aspect of the breast approximately in the 5 o'clock position. This has healed well. No signs of recurrence. No nipple discharge or bleeding. She has a scar in the right axillary region.  ECOG = 1  LABORATORY DATA:  Lab Results  Component Value Date   WBC 8.1 12/13/2015   HGB 14.7 12/13/2015   HCT 43.2 12/13/2015   MCV 94.9 12/13/2015   PLT 214 12/13/2015   NEUTROABS 3.9 12/13/2015   Lab Results  Component Value Date   NA 140 12/13/2015   K 3.6 12/13/2015   CL 103 11/02/2015   CO2 28 12/13/2015   GLUCOSE 105 12/13/2015   CREATININE 0.9 12/13/2015   CALCIUM 9.7 12/13/2015  RADIOGRAPHY: Ct Abdomen Pelvis W Wo Contrast  12/18/2015  CLINICAL DATA:  Followup metastatic sigmoid colon carcinoma. Status post microwave ablation of right hepatic lobe liver metastasis. Previous chemotherapy. Right-sided flank pain. EXAM: CT CHEST, ABDOMEN, AND PELVIS WITH CONTRAST TECHNIQUE: Multidetector CT imaging of the chest, abdomen and pelvis was performed following the standard protocol during bolus administration of intravenous contrast. CONTRAST:  165m ISOVUE-300 IOPAMIDOL (ISOVUE-300) INJECTION 61% COMPARISON:  MRI on 05/30/2015 and CT on 02/13/2015 FINDINGS: CT CHEST FINDINGS Mediastinum/Lymph Nodes: No masses, pathologically enlarged lymph nodes, or other significant  abnormality. No axillary lymphadenopathy identified. Surgical clips again seen in right axilla. Lungs/Pleura: No pulmonary mass, infiltrate, or effusion. A 3 mm pulmonary nodule seen in the left lower lobe on image 62/series 8, which shows no significant change compared to prior study. Musculoskeletal: No chest wall mass or suspicious bone lesions identified. CT ABDOMEN PELVIS FINDINGS Hepatobiliary: Probable tiny sub-cm cyst in segment 4A of the left hepatic lobe on image 31 of series 3 remains stable. Ablation defect in the subcapsular posterior right hepatic lobe is seen which measures 2.4 cm on image 88 of series 5. This is larger than the original metastatic lesion seen at this site, but there is no evidence of residual contrast enhancement to suggest residual tumor at this site. No other liver masses are identified. Prior cholecystectomy noted. No evidence of biliary dilatation. Pancreas: No mass, inflammatory changes, or other significant abnormality. Spleen: Within normal limits in size and appearance. Adrenals/Urinary Tract: No masses identified. Bilateral renal cysts appears stable. No evidence of hydronephrosis. Stomach/Bowel: No evidence of obstruction, inflammatory process, or abnormal fluid collections. Sigmoid colon anastomosis again seen. Vascular/Lymphatic: No pathologically enlarged lymph nodes. No evidence of abdominal aortic aneurysm. Aortic atherosclerotic plaque noted. Reproductive: Enlarged myomatous uterus appears stable. Other: None. Musculoskeletal:  No suspicious bone lesions identified. IMPRESSION: Expected appearance of ablation defect at site of previously demonstrated posterior right hepatic lobe metastasis. No definite evidence of residual or progressive metastatic disease. Stable 3 mm left lower lobe pulmonary nodule. Continued attention recommended on follow-up CT. Stable enlarged myomatous uterus. Electronically Signed   By: JEarle GellM.D.   On: 12/18/2015 15:19   Ct Chest W  Contrast  12/18/2015  CLINICAL DATA:  Followup metastatic sigmoid colon carcinoma. Status post microwave ablation of right hepatic lobe liver metastasis. Previous chemotherapy. Right-sided flank pain. EXAM: CT CHEST, ABDOMEN, AND PELVIS WITH CONTRAST TECHNIQUE: Multidetector CT imaging of the chest, abdomen and pelvis was performed following the standard protocol during bolus administration of intravenous contrast. CONTRAST:  1051mISOVUE-300 IOPAMIDOL (ISOVUE-300) INJECTION 61% COMPARISON:  MRI on 05/30/2015 and CT on 02/13/2015 FINDINGS: CT CHEST FINDINGS Mediastinum/Lymph Nodes: No masses, pathologically enlarged lymph nodes, or other significant abnormality. No axillary lymphadenopathy identified. Surgical clips again seen in right axilla. Lungs/Pleura: No pulmonary mass, infiltrate, or effusion. A 3 mm pulmonary nodule seen in the left lower lobe on image 62/series 8, which shows no significant change compared to prior study. Musculoskeletal: No chest wall mass or suspicious bone lesions identified. CT ABDOMEN PELVIS FINDINGS Hepatobiliary: Probable tiny sub-cm cyst in segment 4A of the left hepatic lobe on image 31 of series 3 remains stable. Ablation defect in the subcapsular posterior right hepatic lobe is seen which measures 2.4 cm on image 88 of series 5. This is larger than the original metastatic lesion seen at this site, but there is no evidence of residual contrast enhancement to suggest residual tumor at this site. No other liver masses are identified.  Prior cholecystectomy noted. No evidence of biliary dilatation. Pancreas: No mass, inflammatory changes, or other significant abnormality. Spleen: Within normal limits in size and appearance. Adrenals/Urinary Tract: No masses identified. Bilateral renal cysts appears stable. No evidence of hydronephrosis. Stomach/Bowel: No evidence of obstruction, inflammatory process, or abnormal fluid collections. Sigmoid colon anastomosis again seen.  Vascular/Lymphatic: No pathologically enlarged lymph nodes. No evidence of abdominal aortic aneurysm. Aortic atherosclerotic plaque noted. Reproductive: Enlarged myomatous uterus appears stable. Other: None. Musculoskeletal:  No suspicious bone lesions identified. IMPRESSION: Expected appearance of ablation defect at site of previously demonstrated posterior right hepatic lobe metastasis. No definite evidence of residual or progressive metastatic disease. Stable 3 mm left lower lobe pulmonary nodule. Continued attention recommended on follow-up CT. Stable enlarged myomatous uterus. Electronically Signed   By: Earle Gell M.D.   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 We discussed the equivalence in terms of survival and local failure between mastectomy and breast conservation. We discussed the role of radiation in decreasing local failures in patients who undergo lumpectomy. We discussed the process of CT simulation and the placement tattoos. We discussed 4-6 weeks of treatment as an outpatient. We discussed the possibility of asymptomatic lung damage. We discussed the low likelihood of secondary malignancies. We discussed the possible side effects including but not limited to skin redness, fatigue, permanent skin darkening, and breast swelling.  Due to her large, pendulous breasts, she will be treated in the prone position like she was for her previous radiation treatments.  PLAN: The patient expressed understanding and agrees with the current course of treatment. CT simulation will be scheduled two weeks from now with radiation treatment starting afterwards. We will call her with the scheduled time and date of the CT simulation. The patient signed a consent form and this was placed in her medical chart.      ------------------------------------------------  Blair Promise, PhD, MD  This document serves as a record of services personally performed by Gery Pray, MD. It was created on his behalf by Darcus Austin, a trained medical scribe. The creation of this record is based on the scribe's personal observations and the provider's statements to them. This document has been checked and approved by the attending provider.

## 2016-01-16 NOTE — Progress Notes (Signed)
Please see the Nurse Progress Note in the MD Initial Consult Encounter for this patient. 

## 2016-01-18 ENCOUNTER — Other Ambulatory Visit: Payer: Self-pay | Admitting: Hematology

## 2016-01-31 ENCOUNTER — Ambulatory Visit (HOSPITAL_BASED_OUTPATIENT_CLINIC_OR_DEPARTMENT_OTHER): Payer: BLUE CROSS/BLUE SHIELD | Admitting: Hematology

## 2016-01-31 ENCOUNTER — Other Ambulatory Visit (HOSPITAL_BASED_OUTPATIENT_CLINIC_OR_DEPARTMENT_OTHER): Payer: BLUE CROSS/BLUE SHIELD

## 2016-01-31 ENCOUNTER — Telehealth: Payer: Self-pay | Admitting: Hematology

## 2016-01-31 ENCOUNTER — Encounter: Payer: Self-pay | Admitting: Hematology

## 2016-01-31 VITALS — BP 143/87 | HR 68 | Temp 98.3°F | Resp 18 | Ht 66.0 in | Wt 287.1 lb

## 2016-01-31 DIAGNOSIS — C187 Malignant neoplasm of sigmoid colon: Secondary | ICD-10-CM

## 2016-01-31 DIAGNOSIS — C787 Secondary malignant neoplasm of liver and intrahepatic bile duct: Secondary | ICD-10-CM | POA: Diagnosis not present

## 2016-01-31 DIAGNOSIS — C50511 Malignant neoplasm of lower-outer quadrant of right female breast: Secondary | ICD-10-CM

## 2016-01-31 DIAGNOSIS — Z86718 Personal history of other venous thrombosis and embolism: Secondary | ICD-10-CM | POA: Insufficient documentation

## 2016-01-31 LAB — CBC & DIFF AND RETIC
BASO%: 0.2 % (ref 0.0–2.0)
Basophils Absolute: 0 10*3/uL (ref 0.0–0.1)
EOS%: 1.5 % (ref 0.0–7.0)
Eosinophils Absolute: 0.1 10*3/uL (ref 0.0–0.5)
HCT: 40.5 % (ref 34.8–46.6)
HGB: 13.9 g/dL (ref 11.6–15.9)
IMMATURE RETIC FRACT: 6.7 % (ref 1.60–10.00)
LYMPH#: 3.6 10*3/uL — AB (ref 0.9–3.3)
LYMPH%: 39.3 % (ref 14.0–49.7)
MCH: 32.2 pg (ref 25.1–34.0)
MCHC: 34.3 g/dL (ref 31.5–36.0)
MCV: 93.8 fL (ref 79.5–101.0)
MONO#: 0.6 10*3/uL (ref 0.1–0.9)
MONO%: 6.9 % (ref 0.0–14.0)
NEUT%: 52.1 % (ref 38.4–76.8)
NEUTROS ABS: 4.7 10*3/uL (ref 1.5–6.5)
PLATELETS: 239 10*3/uL (ref 145–400)
RBC: 4.32 10*6/uL (ref 3.70–5.45)
RDW: 14 % (ref 11.2–14.5)
RETIC CT ABS: 86.4 10*3/uL (ref 33.70–90.70)
Retic %: 2 % (ref 0.70–2.10)
WBC: 9.1 10*3/uL (ref 3.9–10.3)

## 2016-01-31 LAB — COMPREHENSIVE METABOLIC PANEL
ALT: 30 U/L (ref 0–55)
ANION GAP: 8 meq/L (ref 3–11)
AST: 40 U/L — ABNORMAL HIGH (ref 5–34)
Albumin: 3.4 g/dL — ABNORMAL LOW (ref 3.5–5.0)
Alkaline Phosphatase: 46 U/L (ref 40–150)
BUN: 8.9 mg/dL (ref 7.0–26.0)
CHLORIDE: 104 meq/L (ref 98–109)
CO2: 28 meq/L (ref 22–29)
CREATININE: 0.9 mg/dL (ref 0.6–1.1)
Calcium: 9.6 mg/dL (ref 8.4–10.4)
EGFR: 83 mL/min/{1.73_m2} — AB (ref >90)
GLUCOSE: 110 mg/dL (ref 70–140)
Potassium: 3.5 mEq/L (ref 3.5–5.1)
SODIUM: 140 meq/L (ref 136–145)
Total Bilirubin: 0.58 mg/dL (ref 0.20–1.20)
Total Protein: 8 g/dL (ref 6.4–8.3)

## 2016-01-31 MED ORDER — POTASSIUM CHLORIDE CRYS ER 20 MEQ PO TBCR: 20.0000 meq | EXTENDED_RELEASE_TABLET | Freq: Every day | ORAL | Status: DC

## 2016-01-31 NOTE — Progress Notes (Signed)
[facility] ONCOL[facility]Y OFFICE PROGRESS NOTE DATE OF VISIT: 01/31/2016   Veronica Drafts, FNP [address] 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 Left breast DCIS diagnosed in 2006 and 2010, status post lumpectomy. 2. pT2N0M0 stage I colon cancer, s/p partial colectomy on 03/09/2014  3. PE in 12/2013 when she was diagnosed with colon cancer. 4. 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 [facility])   Staging form: Breast, AJCC 7th Edition     Clinical stage from 02/01/2015: Stage IA (T1a, N0, M0) - Signed by Veronica Merle, MD on 06/04/2015     Pathologic stage from 11/10/2015: Stage IA (T1b, N0, cM0) - Signed by Veronica Merle, MD on 12/08/2015 Cancer of sigmoid colon [facility])   Staging form: Colon and Rectum, AJCC 7th Edition     Clinical: Stage I (T2, N0, M0) - Unsigned          Breast cancer of lower-outer quadrant of right female breast ([name])   2006 Cancer Diagnosis Left breast DCIS diagnosed in 2006 and 2010, status post lumpectomy.   01/07/2014 Initial Diagnosis Breast cancer   01/18/2015 Mammogram 27m mass in lower outer quadrant of right breast    02/01/2015 Initial Biopsy right breast mass biopsy showed invasive ductal carcinoma    02/01/2015 Receptors her2 ER 90%+, PR 10%+, kI67 10%, HER2 pending    11/10/2015 Surgery Left breast lumpectomy and sentinel lymph node biopsy   11/10/2015 Pathology Results Left breast lumpectomy showed invasive ductal carcinoma, grade 2, 1 cm, low-grade DCIS, negative margins, 3 sentinel lymph nodes were negative. LVI (-)    11/10/2015 Oncotype testing RS 22, which predicts a year risk of distant  recurrence with tamoxifen alone 14%. Adjuvant chemo was not offered    Cancer of sigmoid colon ([place]   03/04/2014 Initial Diagnosis Cancer of sigmoid colon   03/09/2014 Pathology Results G1 invasive adenocarcinoma, no lymph-Vascular invasion or Peri-neural invasion: Absent. MMR normal, MSI stable.    03/09/2014 Surgery partial colectomy, negative margins.    11/10/2014 Relapse/Recurrence biopsy confirmed oligo liver recurrence    11/10/2014 Imaging PET showed two small ares of hypermetabolism in the right lobe of the liver, no other distant metastasis.    11/30/2014 Imaging abdomen MRI showed a new enhancing lesion which corresponds to an area of abnormal hyper metabolism on recent PET-CT. Stable enlarged portal caval lymph node.    01/02/2015 Pathology Results Liver biopsy showed metastatic adenocarcinoma, IHC (+) for CK20, CDX-2, (-) for TTF-1 and ER   01/02/2015 Miscellaneous liver biopsy KRAS mutation (+)    02/14/2015 Imaging CT showed:  the small metastatic right hepatic lobe liver lesion is not identified for certain on this examination. No new lesions. Stable small cysts in segment 4 a.    03/05/2015 - 04/16/2015 Chemotherapy CAPEOX (capecitabine 2500 mg twice daily Day 1-14, oxaliplatin 1 30 mg/m on day 1, every 21 days, S/P 3 cycles    08/24/2015 Procedure CT-guided oligo liver metastasis microwave ablation by Dr. WBobetta Reyes    CURRENT TREATMENT:  Pending radiation   INTERVAL HISTORY:  Veronica Douglas60y.o. female with a history of recurrent left breast cancer, colon cancer  s/p laparoscopic-assisted partial colectomy (03/09/2014), PE (01/06/2014), and right breast cancer (01/2015),  presents to clinic for follow up.   She is doing well overall.  Right-sided flank pain for the past few months, which radiates to the right upper quadrant of abdomen.  It's intermittent, no associated nausea, change of bowel habits, or other new symptoms. She has been taking  Tylenol , which resolved the pain. The   Pain has not returned in the past few days. She otherwise feels well, has good appetite and eating well, still taking [facility],  She is anxious to start breast radiation,  But has not heard about her schedule yet.  She wants to lose some weight.  MEDICAL HISTORY: Past Medical History  Diagnosis Date  . Hypertension   . Shortness of breath     sob with exertion. dx. with Pulmonary emboli 4'15 ,tx. Xarelto,Lovenox used for [facility].  . Pulmonary embolism ([facility])     "blood clot in lungs" -dx. 4'15 with CT Chest. On Xarelto  . Liver lesion   . Cancer [facility])     left breast cancer x2  . Breast CA ([facility]yes)     radiation and surgery-lt.  . Colon cancer [facility])     Dr. Burr Reyes seeing monthly  . Anxiety   . Radiation 01/22/10-03/12/10    left whole breast 4500 cGy, upper aspect boosted to 6120 cGy    ALLERGIES:  is allergic to tramadol.  MEDICATIONS:    Medication List       This list is accurate as of: 01/31/16  4:31 PM.  Always use your most recent med list.               amLODipine 2.5 MG tablet  Commonly known as:  NORVASC  Take 2.5 mg by mouth daily.     ferrous sulfate 325 (65 FE) MG tablet  Take 325 mg by mouth daily with breakfast.     hydrochlorothiazide 25 MG tablet  Commonly known as:  HYDRODIURIL  Take 25 mg by mouth every morning.     metoprolol 50 MG tablet  Commonly known as:  LOPRESSOR  Take 50 mg by mouth 2 (two) times daily.     potassium chloride SA 20 MEQ tablet  Commonly known as:  K-DUR,KLOR-CON  Take 1 tablet (20 mEq total) by mouth daily.     XARELTO 20 MG Tabs tablet  Generic drug:  rivaroxaban  Take 1 tablet by mouth daily with supper        SURGICAL HISTORY:  Past Surgical History  Procedure Laterality Date  . Cesarean section    . Breast surgery      '11- Left breast lumpectomy  . Cholecystectomy      Laparoscopic 20 yrs ago  . Partial colectomy N/A 03/09/2014    Procedure: LAPAROSCOPIC ASSISTED PARTIAL COLECTOMY, splenic flexure  mobilization;  Surgeon: Veronica Ruff, MD;  Location: [facility];  Service: General;  Laterality: N/A;  . Radiofrequency ablation Right 08/24/2015    Procedure: MICROWAVE ABLATION RIGHT LIVER LOBE ;  Surgeon: Corrie Mckusick, DO;  Location: [facility];  Service: Anesthesiology;  Laterality: Right;  . Colon surgery  03-09-14    partial colectomy for cancer  . Breast lumpectomy with radioactive seed and sentinel lymph node biopsy Right 11/07/2015    Procedure: BREAST LUMPECTOMY WITH RADIOACTIVE SEED AND SENTINEL LYMPH NODE BIOPSY;  Surgeon: Stark Klein, MD;  Location: [facility];  Service: General;  Laterality: Right;    REVIEW OF SYSTEMS:  Constitutional: Denies fevers, chills or abnormal weight loss Eyes: Denies blurriness of vision Ears, nose, mouth, throat, and face: Denies mucositis or sore throat Respiratory: Denies cough, dyspnea or wheezes Cardiovascular: Denies palpitation, chest discomfort or lower extremity swelling Gastrointestinal:  Denies nausea, heartburn or change in bowel habits Skin: Denies abnormal skin rashes Lymphatics: Denies new lymphadenopathy or easy bruising Neurological:Denies numbness, tingling or new weaknesses Behavioral/Psych: Mood is stable, no new changes  All other systems were reviewed with the patient and are negative. Breasts: Breast inspection showed them to be symmetrical with no nipple discharge. Palpation of the breasts and axilla revealed no obvious mass that I could appreciate.   Old surgical scar in the left breast. (+)  Skin erythema below both breasts   PHYSICAL EXAMINATION: ECOG PERFORMANCE STATUS: 1 Blood pressure 143/87, pulse 68, temperature 98.3 F (36.8 C), temperature source Oral, resp. rate 18, height '5\' 6"'  (1.676 m), weight 287 lb 1.6 oz (130.228 kg), last menstrual period 01/23/2014, SpO2 92 %. GENERAL:alert, no distress and comfortable; well developed and obese. Easily mobile to exam table.  SKIN: skin color, texture, turgor are  normal, no rashes or significant lesions EYES: normal, Conjunctiva are pink and non-injected, sclera clear OROPHARYNX:no exudate, no erythema and lips, buccal mucosa, and tongue normal  NECK: supple, thyroid normal size, non-tender, without nodularity LYMPH:  no palpable lymphadenopathy in the cervical, axillary or supraclavicular LUNGS: clear to auscultation with normal breathing effort, no wheezes or rhonchi HEART: regular rate & rhythm and no murmurs and no lower extremity edema ABDOMEN:abdomen soft, non-tender and normal bowel sounds; incision healing with signs of infection.  Mild tenderness at the  Right flank area, no abdominal tenderness. No local megaly Musculoskeletal:no cyanosis of digits and no clubbing  NEURO: alert & oriented x 3 with fluent speech, no focal motor/sensory deficits Breasts: Breast inspection showed them to be symmetrical with no nipple discharge. The surgical scar in her right breast and axilla have healed very well. Palpation of the breasts and axilla revealed no obvious mass that I could appreciate.   Labs:  CBC Latest Ref Rng 01/31/2016 12/13/2015 10/04/2015  WBC 3.9 - 10.3 10e3/uL 9.1 8.1 8.5  Hemoglobin 11.6 - 15.9 g/dL 13.9 14.7 14.7  Hematocrit 34.8 - 46.6 % 40.5 43.2 43.1  Platelets 145 - 400 10e3/uL 239 214 228    CMP Latest Ref Rng 01/31/2016 12/13/2015 11/02/2015  Glucose 70 - 140 mg/dl 110 105 94  BUN 7.0 - 26.0 mg/dL 8.9 10.8 7  Creatinine 0.6 - 1.1 mg/dL 0.9 0.9 0.80  Sodium 136 - 145 mEq/L 140 140 140  Potassium 3.5 - 5.1 mEq/L 3.5 3.6 4.2  Chloride 101 - 111 mmol/L - - 103  CO2 22 - 29 mEq/L '28 28 28  ' Calcium 8.4 - 10.4 mg/dL 9.6 9.7 9.4  Total Protein 6.4 - 8.3 g/dL 8.0 8.1 -  Total Bilirubin 0.20 - 1.20 mg/dL 0.58 0.62 -  Alkaline Phos 40 - 150 U/L 46 47 -  AST 5 - 34 U/L 40(H) 41(H) -  ALT 0 - 55 U/L 30 36 -    PATHOLOGY RESULT Diagnosis 01/02/2015 Liver, needle/core biopsy, right - POSITIVE FOR METASTATIC ADENOCARCINOMA. - SEE  COMMENT. Microscopic Comment Immunohistochemical stains are performed. The tumor is positive for cytokeratin 20 and CDX-2. Cytokeratin 7 stains background bile ducts but is negative within the tumor. The tumor is negative for TTF-1 and estrogen receptor. The morphology coupled with the staining pattern is consistent with metastatic adenocarcinoma of colorectal primary  source.   Diagnosis 11/07/2015 1. Breast, lumpectomy, Right - INVASIVE DUCTAL CARCINOMA, GRADE 2, SPANNING 1 CM. - DUCTAL CARCINOMA IN SITU, LOW GRADE. - RESECTION MARGINS ARE NEGATIVE FOR INVASIVE CARCINOMA. - DUCTAL CARCINOMA IN SITU COMES TO WITHIN 0.3 CM OF THE POSTERIOR MARGIN. - BIOPSY SITE. - SEE ONCOLOGY TABLE. 2. Lymph node, sentinel, biopsy, right axillary #1 - ONE OF ONE LYMPH NODES NEGATIVE FOR CARCINOMA (0/1). 3. Lymph node, sentinel, biopsy, right axillary #2 - ONE OF ONE LYMPH NODES NEGATIVE FOR CARCINOMA (0/1). 4. Lymph node, sentinel, biopsy, right axillary #3 - ONE OF ONE LYMPH NODES NEGATIVE FOR CARCINOMA (0/1). Microscopic Comment 1. BREAST, INVASIVE TUMOR, WITH LYMPH NODES PRESENT Specimen, including laterality and lymph node sampling (sentinel, non-sentinel): Right breast lump and right axillary sentinel lymph nodes. Procedure: Right breast lumpectomy and right axillary sentinel lymph node biopsy. Histologic type: Invasive ductal carcinoma. Grade: 2. Tubule formation: 3 Nuclear pleomorphism: 2 Mitotic:1 Tumor size (gross measurement): 1 cm Margins: Invasive, distance to closest margin: 0.2 cm (posterior). In-situ, distance to closest margin: 0.3 cm (posterior, focal). If margin positive, focally or broadly: N/A. Lymphovascular invasion: Not identified. Ductal carcinoma in situ: Present. Grade: I Extensive intraductal component: No. Lobular neoplasia: Not identified. Tumor focality: Unifocal. Treatment effect: N/A. Extent of tumor: Confined to breast parenchyma. Lymph nodes: Examined: 3  Sentinel 0 Non-sentinel 3 Total Lymph nodes with metastasis: 0 Isolated tumor cells (< 0.2 mm): 0 Micrometastasis: (> 0.2 mm and < 2.0 mm): 0 Macrometastasis: (> 2.0 mm): 0 Extracapsular extension: N/A. Breast prognostic profile: Performed on biopsy ([id number]), see below. Her2 will be repeated on the current specimen. Estrogen receptor: Positive, 90%, strong staining intensity. Progesterone receptor: Positive, 10%, strong staining intensity. Her 2 neu: Negative. Ki-67: 10%. Non-neoplastic breast: Fibrocystic change and usual ductal hyperplasia. Biopsy site. TNM: pT1b, pN0 Comments: None  ONCOTYPE DX: RS 22, which predicts a year risk of distant recurrence with tamoxifen alone 14%  Results: IMMUNOHISTOCHEMICAL AND MORPHOMETRIC ANALYSIS BY THE AUTOMATED CELLULAR IMAGING SYSTEM (ACIS) Estrogen Receptor: 90%, POSITIVE, STRONG STAINING INTENSITY (PERFORMED MANUALLY) Progesterone Receptor: 10%, POSITIVE, STRONG STAINING INTENSITY (PERFORMED MANUALLY) Proliferation Marker Ki67: 10% 1. FLUORESCENCE IN-SITU HYBRIDIZATION Results: HER2 - NEGATIVE RATIO OF HER2/CEP17 SIGNALS 1.29 AVERAGE HER2 COPY NUMBER PER CELL 1.80  RADIOLOGY STUDIES   Abdomen MRI w wo contrast 05/29/2015  IMPRESSION: 1. Mild degradation secondary to patient body habitus. 2. Isolated segment 6 subcapsular liver lesion is similar. No new or progressive disease identified. 3. Interpolar right renal lesion is favored to represent a complex/septated cyst and is not significantly changed in size.  CT guided tissue ablation by Dr. Earleen Newport 08/24/2015 IMPRESSION: Status post image guided microwave ablation of solitary metastasis in the right liver lobe in this patient with oligo metastatic colorectal cancer.   CT CAP with contrast 12/18/2015 IMPRESSION: Expected appearance of ablation defect at site of previously demonstrated posterior right hepatic lobe metastasis. No definite evidence of residual or progressive  metastatic disease.  Stable 3 mm left lower lobe pulmonary nodule. Continued attention recommended on follow-up CT.  Stable enlarged myomatous uterus.   ASSESSMENT: Veronica Reyes 60 y.o. female with a history of  1. New right breast cancer, pT1bN0M0, stage Ia, ER+/PR+/HER2-, History of left Breast Cancer, diagnosed on 02/01/2015 --Left CIS in situ, s/p lumpectomy in 2006 and 2011 by Dr. Margot Chimes, and radiation in 2011 by Dr. Sondra Come.  -I reviewed her surgical pathology results from 11/10/2015, which showed a 1 cm grade 2 invasive ductal carcinoma and DCIS. Surgical  margins were negative. -I reviewed her Oncotype DX result. Her recurrence score is 22, which predicts 14% 10 year risk of distant recurrence with tamoxifen alone. This is intermediate risk, based on her relatively low number, I do not recommend adjuvant chemotherapy. -Giving the strong ER/PR positivity, and previous multiple breast cancer, I strongly recommend her to take aromatase inhibitor as adjuvant endocrine therapy. This will start after her radiation. Potential side effects were reviewed with her, she is agreeable. - she was seen by a radiation oncologist Dr. Sondra Come 2 weeks ago, and will start radiation soon. I sent a message to Dr. Sondra Come today  To see if they can start her as soon as possible.  2.Colon Cancer, Stage I (pT2N0), MMR normal, oligo recurrent disease in liver in 12/2014, KRAS mutation (+) -I previously reviewed the nature history of metastatic colon cancer, and the potential curative approach with chemotherapy followed by liver lesion ablation or resection. However, more likely, this is not curable disease. -She is status post 3 cycles of Capeox, does not want to proceed with fourth cycle due to the moderate side effects from previous chemotherapy treatment. She declined liver mass resection, and underwent liver ablation by interventional radiologist Dr. Earleen Newport in 08/2015. - I reviewed her restaging CT scan from  April 2017, which showed no evidence of recurrence. The previously ablated liver lesion appears stable, no suspicion for residual disease or recurrence.   3. History of PE (01/06/2014) --She has a diagnosis of pulmonary embolism, likely secondary to malignancy in the setting of family history for stroke (and possible stroke history in patient as well). Lower extremity dopplers negative.  -continue Xarelto. Patient asked about if she can come off Xarelto. If she has no evidence of cancer in 3-6 months, I can potentially take her off Xarelto. She understands she has moderate to high risk of recurrent thrombosis if she is off anticoagulation.  4.  Uterine Fibroids  --Negative endometrial biopsy. Following with Gynecology.  5. Kidney lesion (Right). -No hypermetabolic right kidney lesion on the pet scan -Repeat abdominal MRI 05/29/2015 showed unchanged right renal lesion, favored to represent a complex/septated cyst.  6. Right flank pain - possible muscular pain , she does have tender point.  - Recent CT scan reviewed no evidence of cancer recurrence, her renal cysts are stable.  7.  Morbid obesity - I encouraged her to eat healthy and exercise more. - I encouraged her to participate the " living well"  Program in our cancer center,  Which is designed for breast cancer survivor to improve your diet and exercise.  Plan  - she will start breast radiation soon -I will see her back in 2 months to start her on  Adjuvant antiestrogen therapy,  After she completes breast radiation.  All questions were answered. The patient knows to call the clinic with any problems, questions or concerns. We can certainly see the patient much sooner if necessary.  I spent 25 minutes counseling the patient face to face. The total time spent in the appointment was 30 minutes.   Veronica Reyes  01/31/2016

## 2016-01-31 NOTE — Telephone Encounter (Signed)
Genetics apt confirmation will be mailed to pt

## 2016-01-31 NOTE — Telephone Encounter (Signed)
Gave pt apt & avs °

## 2016-02-01 LAB — CEA: CEA1: 4.8 ng/mL — AB (ref 0.0–4.7)

## 2016-02-01 LAB — CEA (PARALLEL TESTING): CEA: 2.7 ng/mL — ABNORMAL HIGH

## 2016-02-06 ENCOUNTER — Telehealth: Payer: Self-pay | Admitting: *Deleted

## 2016-02-06 NOTE — Telephone Encounter (Signed)
Spoke with radiation department to find out why she hasn't been schedule for sim yet.  They state they have left several messages but have been unable to reach her. I called and spoke with patient who states she has had trouble with her phone.  Gave contact number to her to call and get her sim scheduled.  She states she would call and get it scheduled.

## 2016-02-13 ENCOUNTER — Ambulatory Visit
Admission: RE | Admit: 2016-02-13 | Discharge: 2016-02-13 | Disposition: A | Discharge status: Home / Self Care | Payer: BLUE CROSS/BLUE SHIELD | Source: Ambulatory Visit | Attending: Radiation Oncology | Admitting: Radiation Oncology

## 2016-02-13 DIAGNOSIS — Z510 Encounter for antineoplastic radiation therapy: Secondary | ICD-10-CM | POA: Diagnosis not present

## 2016-02-13 DIAGNOSIS — C50511 Malignant neoplasm of lower-outer quadrant of right female breast: Secondary | ICD-10-CM

## 2016-02-13 NOTE — Progress Notes (Signed)
  Radiation Oncology         [phone]) [phone] ________________________________  Name: Veronica Reyes MRN: [id number]  Date: 02/13/2016  DOB: 08-Jun-1956  SIMULATION AND TREATMENT PLANNING NOTE    ICD-9-CM ICD-10-CM   1. Breast cancer of lower-outer quadrant of right female breast ([place]) 174.5 C50.511     DIAGNOSIS: pT1b, pN0 grade II invasive ductal carcinoma of the right breast (ER/PR positive, HER2 negative)  NARRATIVE:  The patient was brought to the [facility].  Identity was confirmed.  All relevant records and images related to the planned course of therapy were reviewed.  The patient freely provided informed written consent to proceed with treatment after reviewing the details related to the planned course of therapy. The consent form was witnessed and verified by the simulation staff.  Then, the patient was set-up in a stable reproducible prone position, in the breast board, for radiation therapy.  CT images were obtained.  Surface markings were placed.  The CT images were loaded into the planning software.  Then the target and avoidance structures were contoured.  Treatment planning then occurred.  The radiation prescription was entered and confirmed.  Then, I designed and supervised the construction of a total of 3 medically necessary complex treatment devices.  I have requested : 3D Simulation  I have requested a DVH of the following structures: lungs, heart, lumpectomy cavity.  I have ordered:dose calc.  PLAN:  The patient will receive 50.4 Gy in 28 fractions Followed by a boost to the lumpectomy cavity of 12 gray and a cumulative dose of 62.4 gray  ________________________________ -----------------------------------  Blair Promise, PhD, MD

## 2016-02-19 DIAGNOSIS — Z510 Encounter for antineoplastic radiation therapy: Secondary | ICD-10-CM | POA: Diagnosis not present

## 2016-02-20 ENCOUNTER — Ambulatory Visit
Admission: RE | Admit: 2016-02-20 | Discharge: 2016-02-20 | Disposition: A | Discharge status: Home / Self Care | Payer: BLUE CROSS/BLUE SHIELD | Source: Ambulatory Visit | Attending: Radiation Oncology | Admitting: Radiation Oncology

## 2016-02-20 DIAGNOSIS — C50511 Malignant neoplasm of lower-outer quadrant of right female breast: Secondary | ICD-10-CM

## 2016-02-20 DIAGNOSIS — Z510 Encounter for antineoplastic radiation therapy: Secondary | ICD-10-CM | POA: Diagnosis not present

## 2016-02-20 NOTE — Progress Notes (Signed)
  Radiation Oncology         [phone]) [phone] ________________________________  Name: Veronica Reyes MRN: [id number]  Date: 02/20/2016  DOB: 1955/09/25  Simulation Verification Note    ICD-9-CM ICD-10-CM   1. Breast cancer of lower-outer quadrant of right female breast ([place]) 174.5 C50.511     Status: outpatient  NARRATIVE: The patient was brought to the treatment unit and placed in the planned treatment position. The clinical setup was verified. Then port films were obtained and uploaded to the radiation oncology medical record software.  The treatment beams were carefully compared against the planned radiation fields. The position location and shape of the radiation fields was reviewed. They targeted volume of tissue appears to be appropriately covered by the radiation beams. Organs at risk appear to be excluded as planned.  Based on my personal review, I approved the simulation verification. The patient's treatment will proceed as planned.  -----------------------------------  Blair Promise, PhD, MD

## 2016-02-21 ENCOUNTER — Ambulatory Visit
Admission: RE | Admit: 2016-02-21 | Discharge: 2016-02-21 | Disposition: A | Discharge status: Home / Self Care | Payer: BLUE CROSS/BLUE SHIELD | Source: Ambulatory Visit | Attending: Radiation Oncology | Admitting: Radiation Oncology

## 2016-02-21 DIAGNOSIS — Z510 Encounter for antineoplastic radiation therapy: Secondary | ICD-10-CM | POA: Diagnosis not present

## 2016-02-22 ENCOUNTER — Ambulatory Visit
Admission: RE | Admit: 2016-02-22 | Discharge: 2016-02-22 | Disposition: A | Discharge status: Home / Self Care | Payer: BLUE CROSS/BLUE SHIELD | Source: Ambulatory Visit | Attending: Radiation Oncology | Admitting: Radiation Oncology

## 2016-02-22 ENCOUNTER — Other Ambulatory Visit: Payer: Self-pay | Admitting: *Deleted

## 2016-02-22 ENCOUNTER — Telehealth: Payer: Self-pay | Admitting: *Deleted

## 2016-02-22 DIAGNOSIS — Z510 Encounter for antineoplastic radiation therapy: Secondary | ICD-10-CM | POA: Diagnosis not present

## 2016-02-22 MED ORDER — HYDROCHLOROTHIAZIDE 25 MG PO TABS: 25.0000 mg | ORAL_TABLET | ORAL | Status: DC

## 2016-02-22 MED ORDER — METOPROLOL TARTRATE 50 MG PO TABS: 50.0000 mg | ORAL_TABLET | Freq: Two times a day (BID) | ORAL | Status: DC

## 2016-02-22 NOTE — Telephone Encounter (Signed)
"  Dr. Burr Medico told me to call if I have a problem.  I am being switched to a new PCP and cannot be seen until next Thursday.  I have one pill left of the Metoprolol 50 mg and HCTZ 25 mg.  They will not refill.  Could she send a refill to [facility].  Thanks.  If any questions I can be reached [phone]

## 2016-02-22 NOTE — Telephone Encounter (Signed)
Spoke with patient.  "PCP refuses to refill until seen on 02/28/16"   Called PCP - Dr. Alphonzo Grieve.  Unable to reach anyone except to leave a message with appointments with call returned in 24hrs.   [facility] - spoke with [place].  They are aware of difficulties in refilling rx's with this provider and have a case manager that helps them there.  Will refill scripts for one week per scripts already on file with [facility] (OK per Dr. Burr Medico).  Called patient and let her know that one week worth of blood pressure medicine has been called in to her pharmacy.  She already has enough norvasc.

## 2016-02-25 ENCOUNTER — Ambulatory Visit
Admission: RE | Admit: 2016-02-25 | Discharge: 2016-02-25 | Disposition: A | Discharge status: Home / Self Care | Payer: BLUE CROSS/BLUE SHIELD | Source: Ambulatory Visit | Attending: Radiation Oncology | Admitting: Radiation Oncology

## 2016-02-25 DIAGNOSIS — Z510 Encounter for antineoplastic radiation therapy: Secondary | ICD-10-CM | POA: Diagnosis not present

## 2016-02-26 ENCOUNTER — Encounter: Payer: Self-pay | Admitting: Radiation Oncology

## 2016-02-26 ENCOUNTER — Ambulatory Visit
Admission: RE | Admit: 2016-02-26 | Discharge: 2016-02-26 | Disposition: A | Discharge status: Home / Self Care | Payer: BLUE CROSS/BLUE SHIELD | Source: Ambulatory Visit | Attending: Radiation Oncology | Admitting: Radiation Oncology

## 2016-02-26 VITALS — BP 153/91 | HR 61 | Temp 98.9°F | Ht 66.0 in | Wt 288.8 lb

## 2016-02-26 DIAGNOSIS — C50511 Malignant neoplasm of lower-outer quadrant of right female breast: Secondary | ICD-10-CM

## 2016-02-26 DIAGNOSIS — Z510 Encounter for antineoplastic radiation therapy: Secondary | ICD-10-CM | POA: Diagnosis not present

## 2016-02-26 MED ORDER — ALRA NON-METALLIC DEODORANT (RAD-ONC)
1.0000 "application " | Freq: Once | TOPICAL | Status: AC
  Administered 2016-02-26: 1 via TOPICAL

## 2016-02-26 MED ORDER — RADIAPLEXRX EX GEL
Freq: Once | CUTANEOUS | Status: AC
  Administered 2016-02-26: 14:00:00 via TOPICAL

## 2016-02-26 NOTE — Progress Notes (Signed)
  Radiation Oncology         [phone]) [phone] ________________________________  Name: Veronica Reyes MRN: [id number]  Date: 02/26/2016  DOB: [birth year]  Weekly Radiation Therapy Management    ICD-9-CM ICD-10-CM   1. Breast cancer of lower-outer quadrant of right female breast ([name]) 174.5 C50.511     Current Dose: 7.2 Gy     Planned Dose:  62.4 Gy  Narrative . . . . . . . . The patient presents for routine under treatment assessment.                                   The patient is without complaint concerning her radiation treatment                                 Set-up films were reviewed.                                 The chart was checked. Physical Findings. . .  height is 5\' 6"  (1.676 m) and weight is 288 lb 12.8 oz (130.999 kg). Her oral temperature is 98.9 F (37.2 C). Her blood pressure is 153/91 and her pulse is 61. . Lungs are clear to auscultation bilaterally. Heart has regular rate and rhythm. No palpable cervical, supraclavicular, or axillary adenopathy. Abdomen soft, non-tender, normal bowel sounds.  Mild hyperpigmentation changes in the right breast Impression . . . . . . . The patient is tolerating radiation. Plan . . . . . . . . . . . . Continue treatment as planned.  ________________________________   Blair Promise, PhD, MD

## 2016-02-26 NOTE — Progress Notes (Signed)
Veronica Reyes has completed 4 fractions to her right breast.  She reports having pain in her right mid back that she has had since surgery.  She takes tylenol as needed.  She denies having fatigue.The skin on her right breast is intact.  BP 153/91 mmHg  Pulse 61  Temp(Src) 98.9 F (37.2 C) (Oral)  Ht 5\' 6"  (1.676 m)  Wt 288 lb 12.8 oz (130.999 kg)  BMI 46.64 kg/m2  LMP 01/23/2014   Wt Readings from Last 3 Encounters:  02/26/16 288 lb 12.8 oz (130.999 kg)  01/31/16 287 lb 1.6 oz (130.228 kg)  01/16/16 289 lb 1.6 oz (131.135 kg)

## 2016-02-26 NOTE — Progress Notes (Signed)
Pt here for patient teaching.  Pt given Radiation and You booklet, skin care instructions, Alra deodorant and Radiaplex gel. Pt reports they have not watched the Radiation Therapy Education video and has been given the link to watch at home. Reviewed areas of pertinence such as fatigue, skin changes, breast tenderness and breast swelling . Pt able to give teach back of to pat skin and use unscented/gentle soap,apply Radiaplex bid and avoid applying anything to skin within 4 hours of treatment. Pt demonstrated understanding and verbalizes understanding of information given and will contact nursing with any questions or concerns.          

## 2016-02-27 ENCOUNTER — Ambulatory Visit
Admission: RE | Admit: 2016-02-27 | Discharge: 2016-02-27 | Disposition: A | Discharge status: Home / Self Care | Payer: BLUE CROSS/BLUE SHIELD | Source: Ambulatory Visit | Attending: Radiation Oncology | Admitting: Radiation Oncology

## 2016-02-27 DIAGNOSIS — Z510 Encounter for antineoplastic radiation therapy: Secondary | ICD-10-CM | POA: Diagnosis not present

## 2016-02-28 ENCOUNTER — Ambulatory Visit
Admission: RE | Admit: 2016-02-28 | Discharge: 2016-02-28 | Disposition: A | Discharge status: Home / Self Care | Payer: BLUE CROSS/BLUE SHIELD | Source: Ambulatory Visit | Attending: Radiation Oncology | Admitting: Radiation Oncology

## 2016-02-28 DIAGNOSIS — Z510 Encounter for antineoplastic radiation therapy: Secondary | ICD-10-CM | POA: Diagnosis not present

## 2016-02-29 ENCOUNTER — Telehealth: Payer: Self-pay | Admitting: *Deleted

## 2016-02-29 ENCOUNTER — Ambulatory Visit
Admission: RE | Admit: 2016-02-29 | Discharge: 2016-02-29 | Disposition: A | Discharge status: Home / Self Care | Payer: BLUE CROSS/BLUE SHIELD | Source: Ambulatory Visit | Attending: Radiation Oncology | Admitting: Radiation Oncology

## 2016-02-29 DIAGNOSIS — Z510 Encounter for antineoplastic radiation therapy: Secondary | ICD-10-CM | POA: Diagnosis not present

## 2016-02-29 NOTE — Telephone Encounter (Signed)
  Oncology Nurse Navigator Documentation    Navigator Encounter Type: Telephone (Encourage to call with questions or needs.) (02/29/16 1400) Telephone: Veronica Reyes Call (Contact information provided.) (02/29/16 1400)         Patient Visit Type: [id number] (Relate doing well and without complaints) (02/29/16 1400) Treatment Phase: First Radiation Tx (02/29/16 1400)                            Time Spent with Patient: 15 (02/29/16 1400)

## 2016-03-03 ENCOUNTER — Ambulatory Visit
Admission: RE | Admit: 2016-03-03 | Discharge: 2016-03-03 | Disposition: A | Discharge status: Home / Self Care | Payer: BLUE CROSS/BLUE SHIELD | Source: Ambulatory Visit | Attending: Radiation Oncology | Admitting: Radiation Oncology

## 2016-03-03 DIAGNOSIS — Z510 Encounter for antineoplastic radiation therapy: Secondary | ICD-10-CM | POA: Diagnosis not present

## 2016-03-04 ENCOUNTER — Ambulatory Visit
Admission: RE | Admit: 2016-03-04 | Discharge: 2016-03-04 | Disposition: A | Discharge status: Home / Self Care | Payer: BLUE CROSS/BLUE SHIELD | Source: Ambulatory Visit | Attending: Radiation Oncology | Admitting: Radiation Oncology

## 2016-03-04 DIAGNOSIS — Z510 Encounter for antineoplastic radiation therapy: Secondary | ICD-10-CM | POA: Diagnosis not present

## 2016-03-05 ENCOUNTER — Ambulatory Visit
Admission: RE | Admit: 2016-03-05 | Discharge: 2016-03-05 | Disposition: A | Discharge status: Home / Self Care | Payer: BLUE CROSS/BLUE SHIELD | Source: Ambulatory Visit | Attending: Radiation Oncology | Admitting: Radiation Oncology

## 2016-03-05 ENCOUNTER — Encounter: Payer: Self-pay | Admitting: Radiation Oncology

## 2016-03-05 VITALS — BP 135/78 | HR 68 | Temp 98.3°F | Ht 66.0 in | Wt 291.9 lb

## 2016-03-05 DIAGNOSIS — Z510 Encounter for antineoplastic radiation therapy: Secondary | ICD-10-CM | POA: Diagnosis not present

## 2016-03-05 DIAGNOSIS — C50511 Malignant neoplasm of lower-outer quadrant of right female breast: Secondary | ICD-10-CM

## 2016-03-05 NOTE — Progress Notes (Signed)
  Radiation Oncology         [phone]) [phone] ________________________________  Name: Veronica Reyes MRN: [id number]  Date: 03/05/2016  DOB: 1956-04-10  Weekly Radiation Therapy Management    ICD-9-CM ICD-10-CM   1. Breast cancer of lower-outer quadrant of right female breast ([name]) 174.5 C50.511     Current Dose: 18 Gy     Planned Dose:  62.4 Gy  Narrative . . . . . . . . The patient presents for routine under treatment assessment.  Veronica Reyes has completed 10 fractions to her right breast. She reports having pain in her right lower back that she thinks is from her liver ablation. She is taking tylenol #4 at night which helps the pain. She reports having fatigue. She has not started using radiaplex yet. No itching along the breast.                                  Set-up films were reviewed.                                 The chart was checked. Physical Findings. . .  height is 5\' 6"  (1.676 m) and weight is 291 lb 14.4 oz (132.405 kg). Her oral temperature is 98.3 F (36.8 C). Her blood pressure is 135/78 and her pulse is 68. Her oxygen saturation is 100%.   Lungs are clear to auscultation bilaterally. Heart has regular rate and rhythm. No palpable cervical, supraclavicular, or axillary adenopathy. Minimal hyperpigmentation changes in the right breast. Impression . . . . . . . The patient is tolerating radiation. Plan . . . . . . . . . . . . Continue treatment as planned.  ________________________________   Blair Promise, PhD, MD  This document serves as a record of services personally performed by Gery Pray, MD. It was created on his behalf by Darcus Austin, a trained medical scribe. The creation of this record is based on the scribe's personal observations and the provider's statements to them. This document has been checked and approved by the attending provider.

## 2016-03-05 NOTE — Progress Notes (Signed)
Emary Sabra Heck has completed 10 fractions to her right breast.  She reports having pain in her right lower back that she thinks is from her liver ablation.  She is taking tylenol #4 at night which helps the pain.  She reports having fatigue.  She has not started using radiaplex yet.  The skin underneath her right breast has hyperpigmentation.  BP 135/78 mmHg  Pulse 68  Temp(Src) 98.3 F (36.8 C) (Oral)  Ht 5\' 6"  (1.676 m)  Wt 291 lb 14.4 oz (132.405 kg)  BMI 47.14 kg/m2  SpO2 100%  LMP 01/23/2014   Wt Readings from Last 3 Encounters:  03/05/16 291 lb 14.4 oz (132.405 kg)  02/26/16 288 lb 12.8 oz (130.999 kg)  01/31/16 287 lb 1.6 oz (130.228 kg)

## 2016-03-06 ENCOUNTER — Ambulatory Visit
Admission: RE | Admit: 2016-03-06 | Discharge: 2016-03-06 | Disposition: A | Discharge status: Home / Self Care | Payer: BLUE CROSS/BLUE SHIELD | Source: Ambulatory Visit | Attending: Radiation Oncology | Admitting: Radiation Oncology

## 2016-03-06 DIAGNOSIS — Z510 Encounter for antineoplastic radiation therapy: Secondary | ICD-10-CM | POA: Diagnosis not present

## 2016-03-07 ENCOUNTER — Ambulatory Visit
Admission: RE | Admit: 2016-03-07 | Discharge: 2016-03-07 | Disposition: A | Discharge status: Home / Self Care | Payer: BLUE CROSS/BLUE SHIELD | Source: Ambulatory Visit | Attending: Radiation Oncology | Admitting: Radiation Oncology

## 2016-03-07 DIAGNOSIS — Z510 Encounter for antineoplastic radiation therapy: Secondary | ICD-10-CM | POA: Diagnosis not present

## 2016-03-10 ENCOUNTER — Ambulatory Visit
Admission: RE | Admit: 2016-03-10 | Discharge: 2016-03-10 | Disposition: A | Discharge status: Home / Self Care | Payer: BLUE CROSS/BLUE SHIELD | Source: Ambulatory Visit | Attending: Radiation Oncology | Admitting: Radiation Oncology

## 2016-03-10 DIAGNOSIS — Z510 Encounter for antineoplastic radiation therapy: Secondary | ICD-10-CM | POA: Diagnosis not present

## 2016-03-11 ENCOUNTER — Encounter: Payer: Self-pay | Admitting: Radiation Oncology

## 2016-03-11 ENCOUNTER — Ambulatory Visit
Admission: RE | Admit: 2016-03-11 | Discharge: 2016-03-11 | Disposition: A | Discharge status: Home / Self Care | Payer: BLUE CROSS/BLUE SHIELD | Source: Ambulatory Visit | Attending: Radiation Oncology | Admitting: Radiation Oncology

## 2016-03-11 VITALS — BP 134/78 | HR 69 | Temp 98.2°F | Ht 66.0 in | Wt 295.6 lb

## 2016-03-11 DIAGNOSIS — C50511 Malignant neoplasm of lower-outer quadrant of right female breast: Secondary | ICD-10-CM

## 2016-03-11 DIAGNOSIS — Z510 Encounter for antineoplastic radiation therapy: Secondary | ICD-10-CM | POA: Diagnosis not present

## 2016-03-11 MED ORDER — ALRA NON-METALLIC DEODORANT (RAD-ONC)
1.0000 "application " | Freq: Once | TOPICAL | Status: AC
  Administered 2016-03-11: 1 via TOPICAL

## 2016-03-11 NOTE — Progress Notes (Signed)
Veronica Reyes has completed 14 fractions to her right breast.  She continues to have pain in her right flank area.  She is taking tylenol #4 which helps.  She reports having fatigue.  She has not used radiaplex yet.  She has been provided with a refill of Alra Deoderant.  The skin on her right breast has slight hyperpigmentation.  She does report having some tenderness in her right nipple area.  BP 134/78 mmHg  Pulse 69  Temp(Src) 98.2 F (36.8 C) (Oral)  Ht 5\' 6"  (1.676 m)  Wt 295 lb 9.6 oz (134.083 kg)  BMI 47.73 kg/m2  SpO2 100%  LMP 01/23/2014   Wt Readings from Last 3 Encounters:  03/11/16 295 lb 9.6 oz (134.083 kg)  03/05/16 291 lb 14.4 oz (132.405 kg)  02/26/16 288 lb 12.8 oz (130.999 kg)

## 2016-03-11 NOTE — Progress Notes (Signed)
  Radiation Oncology         [phone]) [phone] ________________________________  Name: Veronica Reyes MRN: [id number]  Date: 03/11/2016  DOB: [birth year]  Weekly Radiation Therapy Management    ICD-9-CM ICD-10-CM   1. Breast cancer of lower-outer quadrant of right female breast ([name]) 174.5 [id number] non-metallic deodorant (ALRA) 1 application    Current Dose: 25.2 Gy     Planned Dose:  62.4 Gy  Narrative . . . . . . . . The patient presents for routine under treatment assessment.  Christian Hetzer has completed 14 fractions to her right breast. She continues to have pain in her right flank area. She is taking tylenol #4 which helps. She reports having fatigue. She has not used radiaplex yet. She has been provided with a refill of Alra Deoderant. She denies pruritus, but reports some tenderness/soreness of her right nipple.                                  Set-up films were reviewed.                                 The chart was checked. Physical Findings. . .  height is 5\' 6"  (1.676 m) and weight is 295 lb 9.6 oz (134.083 kg). Her oral temperature is 98.2 F (36.8 C). Her blood pressure is 134/78 and her pulse is 69. Her oxygen saturation is 100%.   Lungs are clear to auscultation bilaterally. Heart has regular rate and rhythm.  hyperpigmentation changes in the right breast, no skin breakdown, slight erythema. Impression . . . . . . . The patient is tolerating radiation. Plan . . . . . . . . . . . . Continue treatment as planned.  ________________________________   Blair Promise, PhD, MD  This document serves as a record of services personally performed by Gery Pray, MD. It was created on his behalf by Darcus Austin, a trained medical scribe. The creation of this record is based on the scribe's personal observations and the provider's statements to them. This document has been checked and approved by the attending provider.

## 2016-03-12 ENCOUNTER — Ambulatory Visit
Admission: RE | Admit: 2016-03-12 | Discharge: 2016-03-12 | Disposition: A | Discharge status: Home / Self Care | Payer: BLUE CROSS/BLUE SHIELD | Source: Ambulatory Visit | Attending: Radiation Oncology | Admitting: Radiation Oncology

## 2016-03-12 DIAGNOSIS — Z510 Encounter for antineoplastic radiation therapy: Secondary | ICD-10-CM | POA: Diagnosis not present

## 2016-03-13 ENCOUNTER — Ambulatory Visit
Admission: RE | Admit: 2016-03-13 | Discharge: 2016-03-13 | Disposition: A | Discharge status: Home / Self Care | Payer: BLUE CROSS/BLUE SHIELD | Source: Ambulatory Visit | Attending: Radiation Oncology | Admitting: Radiation Oncology

## 2016-03-13 DIAGNOSIS — Z510 Encounter for antineoplastic radiation therapy: Secondary | ICD-10-CM | POA: Diagnosis not present

## 2016-03-14 ENCOUNTER — Ambulatory Visit
Admission: RE | Admit: 2016-03-14 | Discharge: 2016-03-14 | Disposition: A | Discharge status: Home / Self Care | Payer: BLUE CROSS/BLUE SHIELD | Source: Ambulatory Visit | Attending: Radiation Oncology | Admitting: Radiation Oncology

## 2016-03-14 DIAGNOSIS — Z510 Encounter for antineoplastic radiation therapy: Secondary | ICD-10-CM | POA: Diagnosis not present

## 2016-03-17 ENCOUNTER — Ambulatory Visit
Admission: RE | Admit: 2016-03-17 | Discharge: 2016-03-17 | Disposition: A | Discharge status: Home / Self Care | Payer: BLUE CROSS/BLUE SHIELD | Source: Ambulatory Visit | Attending: Radiation Oncology | Admitting: Radiation Oncology

## 2016-03-17 ENCOUNTER — Encounter: Payer: Self-pay | Admitting: Radiation Oncology

## 2016-03-17 VITALS — BP 140/79 | HR 57 | Temp 98.4°F | Resp 18 | Ht 66.0 in | Wt 290.1 lb

## 2016-03-17 DIAGNOSIS — Z510 Encounter for antineoplastic radiation therapy: Secondary | ICD-10-CM | POA: Diagnosis not present

## 2016-03-17 DIAGNOSIS — C50511 Malignant neoplasm of lower-outer quadrant of right female breast: Secondary | ICD-10-CM

## 2016-03-17 NOTE — Progress Notes (Signed)
Veronica Reyes has completed 18 fractions to her right breast. She continues to have pain in her right flank area taking Tylenol #4 which helps reduce the pain. She reports having fatigue in the afternoon.  Using Radiaplex gel bid.. She has been provided with a refill of Alra Deoderant. The skin on her right breast has slight hyperpigmentation. She does report having some tenderness in her right nipple area not as bad.

## 2016-03-17 NOTE — Progress Notes (Signed)
Department of Radiation Oncology  Phone:  [phone] Fax:        [phone]  Weekly Treatment Note    Name: Veronica Reyes Date: 03/17/2016 MRN: [id number] DOB: 1955-11-20   Diagnosis:     ICD-9-CM ICD-10-CM   1. Breast cancer of lower-outer quadrant of right female breast ([place]) 174.5 C50.511      Current dose: 32.4 Gy  Current fraction: 18   MEDICATIONS: Current Outpatient Prescriptions  Medication Sig Dispense Refill  . acetaminophen (TYLENOL) 500 MG tablet Take 500 mg by mouth every 6 (six) hours as needed. Reported on 03/11/2016    . acetaminophen-codeine (TYLENOL #4) 300-60 MG tablet Take 1 tablet by mouth every 6 (six) hours as needed. for pain  0  . amLODipine (NORVASC) 2.5 MG tablet Take 2.5 mg by mouth daily.  2  . ferrous sulfate 325 (65 FE) MG tablet Take 325 mg by mouth daily with breakfast.     . hyaluronate sodium (RADIAPLEXRX) GEL Apply 1 application topically once. Reported on 03/11/2016    . hydrochlorothiazide (HYDRODIURIL) 25 MG tablet Take 1 tablet (25 mg total) by mouth every morning. 7 tablet 0  . metoprolol (LOPRESSOR) 50 MG tablet Take 1 tablet (50 mg total) by mouth 2 (two) times daily. 14 tablet 0  . non-metallic deodorant (ALRA) MISC Apply 1 application topically daily as needed.    . potassium chloride SA (K-DUR,KLOR-CON) 20 MEQ tablet Take 1 tablet (20 mEq total) by mouth daily. 30 tablet 2  . XARELTO 20 MG TABS tablet Take 1 tablet by mouth daily with supper 30 tablet 0   No current facility-administered medications for this encounter.     ALLERGIES: Tramadol   LABORATORY DATA:  Lab Results  Component Value Date   WBC 9.1 01/31/2016   HGB 13.9 01/31/2016   HCT 40.5 01/31/2016   MCV 93.8 01/31/2016   PLT 239 01/31/2016   Lab Results  Component Value Date   NA 140 01/31/2016   K 3.5 01/31/2016   CL 103 11/02/2015   CO2 28 01/31/2016   Lab Results  Component Value Date   ALT 30 01/31/2016   AST 40* 01/31/2016   ALKPHOS  46 01/31/2016   BILITOT 0.58 01/31/2016     NARRATIVE: Veronica Reyes was seen today for weekly treatment management. The chart was checked and the patient's films were reviewed.  Veronica Reyes has completed 18 fractions to her right breast. She continues to have pain in her right flank area taking Tylenol #4 which helps reduce the pain. She reports having fatigue in the afternoon. Using Radiaplex gel bid. She has been provided with a refill of Alra Deoderant. The skin on her right breast has slight hyperpigmentation. She does report having some tenderness in her right nipple area not as bad.   PHYSICAL EXAMINATION: height is 5\' 6"  (1.676 m) and weight is 290 lb 1.6 oz (131.588 kg). Her oral temperature is 98.4 F (36.9 C). Her blood pressure is 140/79 and her pulse is 57. Her respiration is 18 and oxygen saturation is 100%.    She has some mild hyperpigmentation in the treatment area.  ASSESSMENT: The patient is doing satisfactorily with treatment.  PLAN: We will continue with the patient's radiation treatment as planned.    ------------------------------------------------  Jodelle Gross, MD, PhD    This document serves as a record of services personally performed by Kyung Rudd, MD. It was created on his behalf by Lendon Collar, a trained medical  scribe. The creation of this record is based on the scribe's personal observations and the provider's statements to them. This document has been checked and approved by the attending provider.

## 2016-03-19 ENCOUNTER — Ambulatory Visit
Admission: RE | Admit: 2016-03-19 | Discharge: 2016-03-19 | Disposition: A | Discharge status: Home / Self Care | Payer: BLUE CROSS/BLUE SHIELD | Source: Ambulatory Visit | Attending: Radiation Oncology | Admitting: Radiation Oncology

## 2016-03-19 DIAGNOSIS — Z510 Encounter for antineoplastic radiation therapy: Secondary | ICD-10-CM | POA: Diagnosis not present

## 2016-03-20 ENCOUNTER — Ambulatory Visit
Admission: RE | Admit: 2016-03-20 | Discharge: 2016-03-20 | Disposition: A | Discharge status: Home / Self Care | Payer: BLUE CROSS/BLUE SHIELD | Source: Ambulatory Visit | Attending: Radiation Oncology | Admitting: Radiation Oncology

## 2016-03-20 DIAGNOSIS — Z510 Encounter for antineoplastic radiation therapy: Secondary | ICD-10-CM | POA: Diagnosis not present

## 2016-03-21 ENCOUNTER — Ambulatory Visit
Admission: RE | Admit: 2016-03-21 | Discharge: 2016-03-21 | Disposition: A | Discharge status: Home / Self Care | Payer: BLUE CROSS/BLUE SHIELD | Source: Ambulatory Visit | Attending: Radiation Oncology | Admitting: Radiation Oncology

## 2016-03-21 DIAGNOSIS — Z510 Encounter for antineoplastic radiation therapy: Secondary | ICD-10-CM | POA: Diagnosis not present

## 2016-03-24 ENCOUNTER — Encounter: Payer: BLUE CROSS/BLUE SHIELD | Admitting: Genetic Counselor

## 2016-03-24 ENCOUNTER — Ambulatory Visit: Payer: BLUE CROSS/BLUE SHIELD

## 2016-03-24 ENCOUNTER — Ambulatory Visit
Admission: RE | Admit: 2016-03-24 | Discharge: 2016-03-24 | Disposition: A | Discharge status: Home / Self Care | Payer: BLUE CROSS/BLUE SHIELD | Source: Ambulatory Visit | Attending: Radiation Oncology | Admitting: Radiation Oncology

## 2016-03-25 ENCOUNTER — Ambulatory Visit
Admission: RE | Admit: 2016-03-25 | Discharge: 2016-03-25 | Disposition: A | Discharge status: Home / Self Care | Payer: BLUE CROSS/BLUE SHIELD | Source: Ambulatory Visit | Attending: Radiation Oncology | Admitting: Radiation Oncology

## 2016-03-25 ENCOUNTER — Inpatient Hospital Stay: Admission: RE | Admit: 2016-03-25 | Payer: Self-pay | Source: Ambulatory Visit | Admitting: Radiation Oncology

## 2016-03-25 DIAGNOSIS — Z510 Encounter for antineoplastic radiation therapy: Secondary | ICD-10-CM | POA: Diagnosis not present

## 2016-03-26 ENCOUNTER — Ambulatory Visit
Admission: RE | Admit: 2016-03-26 | Discharge: 2016-03-26 | Disposition: A | Discharge status: Home / Self Care | Payer: BLUE CROSS/BLUE SHIELD | Source: Ambulatory Visit | Attending: Radiation Oncology | Admitting: Radiation Oncology

## 2016-03-26 DIAGNOSIS — Z510 Encounter for antineoplastic radiation therapy: Secondary | ICD-10-CM | POA: Diagnosis not present

## 2016-03-27 ENCOUNTER — Encounter: Payer: BLUE CROSS/BLUE SHIELD | Admitting: Hematology

## 2016-03-27 ENCOUNTER — Encounter: Payer: Self-pay | Admitting: Hematology

## 2016-03-27 ENCOUNTER — Telehealth: Payer: Self-pay | Admitting: Hematology

## 2016-03-27 ENCOUNTER — Ambulatory Visit
Admission: RE | Admit: 2016-03-27 | Discharge: 2016-03-27 | Disposition: A | Discharge status: Home / Self Care | Payer: BLUE CROSS/BLUE SHIELD | Source: Ambulatory Visit | Attending: Radiation Oncology | Admitting: Radiation Oncology

## 2016-03-27 ENCOUNTER — Other Ambulatory Visit (HOSPITAL_BASED_OUTPATIENT_CLINIC_OR_DEPARTMENT_OTHER): Payer: BLUE CROSS/BLUE SHIELD

## 2016-03-27 ENCOUNTER — Encounter: Payer: Self-pay | Admitting: Radiation Oncology

## 2016-03-27 ENCOUNTER — Other Ambulatory Visit: Payer: BLUE CROSS/BLUE SHIELD

## 2016-03-27 VITALS — BP 123/75 | HR 60 | Temp 98.6°F | Wt 288.3 lb

## 2016-03-27 DIAGNOSIS — Z510 Encounter for antineoplastic radiation therapy: Secondary | ICD-10-CM | POA: Diagnosis not present

## 2016-03-27 DIAGNOSIS — C50511 Malignant neoplasm of lower-outer quadrant of right female breast: Secondary | ICD-10-CM

## 2016-03-27 DIAGNOSIS — C187 Malignant neoplasm of sigmoid colon: Secondary | ICD-10-CM

## 2016-03-27 LAB — COMPREHENSIVE METABOLIC PANEL
ALBUMIN: 3.5 g/dL (ref 3.5–5.0)
ALK PHOS: 53 U/L (ref 40–150)
ALT: 46 U/L (ref 0–55)
ANION GAP: 8 meq/L (ref 3–11)
AST: 53 U/L — ABNORMAL HIGH (ref 5–34)
BILIRUBIN TOTAL: 0.77 mg/dL (ref 0.20–1.20)
BUN: 9.5 mg/dL (ref 7.0–26.0)
CALCIUM: 9.8 mg/dL (ref 8.4–10.4)
CO2: 28 meq/L (ref 22–29)
CREATININE: 0.9 mg/dL (ref 0.6–1.1)
Chloride: 105 mEq/L (ref 98–109)
EGFR: 79 mL/min/{1.73_m2} — AB (ref >90)
Glucose: 100 mg/dl (ref 70–140)
Potassium: 3.8 mEq/L (ref 3.5–5.1)
Sodium: 141 mEq/L (ref 136–145)
TOTAL PROTEIN: 8.4 g/dL — AB (ref 6.4–8.3)

## 2016-03-27 LAB — CBC WITH DIFFERENTIAL/PLATELET
BASO%: 0.7 % (ref 0.0–2.0)
Basophils Absolute: 0.1 10*3/uL (ref 0.0–0.1)
EOS ABS: 0.1 10*3/uL (ref 0.0–0.5)
EOS%: 1.4 % (ref 0.0–7.0)
HEMATOCRIT: 44.1 % (ref 34.8–46.6)
HEMOGLOBIN: 14.7 g/dL (ref 11.6–15.9)
LYMPH#: 2.8 10*3/uL (ref 0.9–3.3)
LYMPH%: 27.7 % (ref 14.0–49.7)
MCH: 31.6 pg (ref 25.1–34.0)
MCHC: 33.4 g/dL (ref 31.5–36.0)
MCV: 94.6 fL (ref 79.5–101.0)
MONO#: 0.8 10*3/uL (ref 0.1–0.9)
MONO%: 7.5 % (ref 0.0–14.0)
NEUT%: 62.7 % (ref 38.4–76.8)
NEUTROS ABS: 6.3 10*3/uL (ref 1.5–6.5)
PLATELETS: 255 10*3/uL (ref 145–400)
RBC: 4.66 10*6/uL (ref 3.70–5.45)
RDW: 14.4 % (ref 11.2–14.5)
WBC: 10.1 10*3/uL (ref 3.9–10.3)

## 2016-03-27 MED ORDER — RADIAPLEXRX EX GEL
Freq: Once | CUTANEOUS | Status: AC
  Administered 2016-03-27: 12:00:00 via TOPICAL

## 2016-03-27 NOTE — Progress Notes (Signed)
  Radiation Oncology         [phone]) [phone] ________________________________  Name: Veronica Reyes MRN: [id number]  Date: 03/27/2016  DOB: 1956/08/16  Weekly Radiation Therapy Management    ICD-9-CM ICD-10-CM   1. Breast cancer of lower-outer quadrant of right female breast ([name]) 174.5 C50.511 hyaluronate sodium (RADIAPLEXRX) gel    Current Dose: 43.2 Gy     Planned Dose:  62.4 Gy  Narrative . . . . . . . . The patient presents for routine under treatment assessment.  Veronica Reyes has received 24 fractions to her right breast. The nurse noted hyperpigmentation in the upper right axillary region and the underside of her right breast. Skin remains intact on the anterior and posterior regions. She reports that she now takes a daily nap.                                  Set-up films were reviewed.                                 The chart was checked. Physical Findings. . .  weight is 288 lb 4.8 oz (130.772 kg). Her temperature is 98.6 F (37 C). Her blood pressure is 123/75 and her pulse is 60.   Lungs are clear to auscultation bilaterally. Heart has regular rate and rhythm. Right breast shows erythema and hyperpigmentation changes. No moist desquamation. Impression . . . . . . . The patient is tolerating radiation. Plan . . . . . . . . . . . . Continue treatment as planned. The patient was provided with another tube of Radiaplex Gel.  ________________________________   Blair Promise, PhD, MD  This document serves as a record of services personally performed by Gery Pray, MD. It was created on his behalf by Darcus Austin, a trained medical scribe. The creation of this record is based on the scribe's personal observations and the provider's statements to them. This document has been checked and approved by the attending provider.

## 2016-03-27 NOTE — Progress Notes (Signed)
Veronica Reyes has received 24 fractions to her right breast.  Not hyperpigmentation in upper axillary region and the underside of her breast.  Skin remains intact on the anterior and posterior regions.   She reports that she is now daily nap. Given Radiaplex Gel

## 2016-03-27 NOTE — Telephone Encounter (Signed)
pt cld to CX and r/s appt-gave r/s time & date

## 2016-03-27 NOTE — Progress Notes (Signed)
This encounter was created in error - please disregard.

## 2016-03-28 ENCOUNTER — Ambulatory Visit
Admission: RE | Admit: 2016-03-28 | Discharge: 2016-03-28 | Disposition: A | Discharge status: Home / Self Care | Payer: BLUE CROSS/BLUE SHIELD | Source: Ambulatory Visit | Attending: Radiation Oncology | Admitting: Radiation Oncology

## 2016-03-28 DIAGNOSIS — Z510 Encounter for antineoplastic radiation therapy: Secondary | ICD-10-CM | POA: Diagnosis not present

## 2016-03-31 ENCOUNTER — Ambulatory Visit
Admission: RE | Admit: 2016-03-31 | Discharge: 2016-03-31 | Disposition: A | Discharge status: Home / Self Care | Payer: BLUE CROSS/BLUE SHIELD | Source: Ambulatory Visit | Attending: Radiation Oncology | Admitting: Radiation Oncology

## 2016-03-31 DIAGNOSIS — Z510 Encounter for antineoplastic radiation therapy: Secondary | ICD-10-CM | POA: Diagnosis not present

## 2016-04-01 ENCOUNTER — Ambulatory Visit
Admission: RE | Admit: 2016-04-01 | Discharge: 2016-04-01 | Disposition: A | Discharge status: Home / Self Care | Payer: BLUE CROSS/BLUE SHIELD | Source: Ambulatory Visit | Attending: Radiation Oncology | Admitting: Radiation Oncology

## 2016-04-01 ENCOUNTER — Ambulatory Visit: Payer: BLUE CROSS/BLUE SHIELD | Admitting: Hematology

## 2016-04-01 ENCOUNTER — Other Ambulatory Visit: Payer: BLUE CROSS/BLUE SHIELD

## 2016-04-01 VITALS — BP 131/95 | HR 58 | Temp 99.1°F | Ht 66.0 in | Wt 290.2 lb

## 2016-04-01 DIAGNOSIS — C50511 Malignant neoplasm of lower-outer quadrant of right female breast: Secondary | ICD-10-CM

## 2016-04-01 DIAGNOSIS — Z510 Encounter for antineoplastic radiation therapy: Secondary | ICD-10-CM | POA: Diagnosis not present

## 2016-04-01 NOTE — Progress Notes (Signed)
[name] has completed 27 fractions to her right breast. She reports having pain in her right nipple area.  She is using radiaplex 3 times a day.  She reports having fatigue in the afternoons.  The skin on her right underarm and breast is red with hyperpigmentation.  She has a small peeling area under her right breast.  BP 131/95 mmHg  Pulse 58  Temp(Src) 99.1 F (37.3 C) (Oral)  Ht 5\' 6"  (1.676 m)  Wt 290 lb 3.2 oz (131.634 kg)  BMI 46.86 kg/m2  SpO2 100%  LMP 01/23/2014   Wt Readings from Last 3 Encounters:  04/01/16 290 lb 3.2 oz (131.634 kg)  03/27/16 288 lb 4.8 oz (130.772 kg)  03/17/16 290 lb 1.6 oz (131.588 kg)

## 2016-04-01 NOTE — Progress Notes (Signed)
  Radiation Oncology         [phone]) [phone] ________________________________  Name: Veronica Reyes MRN: [id number]  Date: 04/01/2016  DOB: [birth year]    Weekly Radiation Therapy Management    ICD-9-CM  ICD-10-CM    1.  Breast cancer of lower-outer quadrant of right female breast ([name])  174.5  C50.511  hyaluronate sodium (RADIAPLEXRX) gel      Current Dose: 48.6 Gy     Planned Dose:  62.4 Gy  Narrative . . . . . . . . The patient presents for routine under treatment assessment.   Veronica Reyes has completed 27 fractions to her right breast. She reports having pain in her right nipple area. She is using radiaplex 3 times a day. She reports having fatigue in the afternoons. The skin on her right underarm and breast is red with hyperpigmentation. She has a small peeling area under her right breast.                                 Set-up films were reviewed.                                 The chart was checked. Physical Findings. . .  height is 5\' 6"  (1.676 m) and weight is 290 lb 3.2 oz (131.634 kg). Her oral temperature is 99.1 F (37.3 C). Her blood pressure is 131/95 and her pulse is 58. Her oxygen saturation is 100%.   Lungs are clear to auscultation bilaterally. Heart has regular rate and rhythm. Right breast shows hyperpigmentation changes. No skin breakdown. Impression . . . . . . . The patient is tolerating radiation. Plan . . . . . . . . . . . . Continue treatment as planned.  ________________________________   Blair Promise, PhD, MD    This document serves as a record of services personally performed by Gery Pray, MD. It was created on his behalf by Lendon Collar, a trained medical scribe. The creation of this record is based on the scribe's personal observations and the provider's statements to them. This document has been checked and approved by the attending provider.

## 2016-04-02 ENCOUNTER — Ambulatory Visit: Payer: BLUE CROSS/BLUE SHIELD

## 2016-04-02 ENCOUNTER — Ambulatory Visit
Admission: RE | Admit: 2016-04-02 | Discharge: 2016-04-02 | Disposition: A | Discharge status: Home / Self Care | Payer: BLUE CROSS/BLUE SHIELD | Source: Ambulatory Visit | Attending: Radiation Oncology | Admitting: Radiation Oncology

## 2016-04-02 DIAGNOSIS — Z510 Encounter for antineoplastic radiation therapy: Secondary | ICD-10-CM | POA: Diagnosis not present

## 2016-04-03 ENCOUNTER — Ambulatory Visit
Admission: RE | Admit: 2016-04-03 | Discharge: 2016-04-03 | Disposition: A | Discharge status: Home / Self Care | Payer: BLUE CROSS/BLUE SHIELD | Source: Ambulatory Visit | Attending: Radiation Oncology | Admitting: Radiation Oncology

## 2016-04-03 ENCOUNTER — Ambulatory Visit: Payer: BLUE CROSS/BLUE SHIELD

## 2016-04-03 DIAGNOSIS — Z510 Encounter for antineoplastic radiation therapy: Secondary | ICD-10-CM | POA: Diagnosis not present

## 2016-04-04 ENCOUNTER — Ambulatory Visit
Admission: RE | Admit: 2016-04-04 | Discharge: 2016-04-04 | Disposition: A | Discharge status: Home / Self Care | Payer: BLUE CROSS/BLUE SHIELD | Source: Ambulatory Visit | Attending: Radiation Oncology | Admitting: Radiation Oncology

## 2016-04-04 DIAGNOSIS — Z510 Encounter for antineoplastic radiation therapy: Secondary | ICD-10-CM | POA: Diagnosis not present

## 2016-04-07 ENCOUNTER — Ambulatory Visit
Admission: RE | Admit: 2016-04-07 | Discharge: 2016-04-07 | Disposition: A | Discharge status: Home / Self Care | Payer: BLUE CROSS/BLUE SHIELD | Source: Ambulatory Visit | Attending: Radiation Oncology | Admitting: Radiation Oncology

## 2016-04-07 DIAGNOSIS — Z510 Encounter for antineoplastic radiation therapy: Secondary | ICD-10-CM | POA: Diagnosis not present

## 2016-04-08 ENCOUNTER — Ambulatory Visit
Admission: RE | Admit: 2016-04-08 | Discharge: 2016-04-08 | Disposition: A | Discharge status: Home / Self Care | Payer: BLUE CROSS/BLUE SHIELD | Source: Ambulatory Visit | Attending: Radiation Oncology | Admitting: Radiation Oncology

## 2016-04-08 ENCOUNTER — Encounter: Payer: Self-pay | Admitting: Radiation Oncology

## 2016-04-08 VITALS — BP 142/82 | HR 75 | Temp 98.6°F | Ht 66.0 in | Wt 288.4 lb

## 2016-04-08 DIAGNOSIS — C50511 Malignant neoplasm of lower-outer quadrant of right female breast: Secondary | ICD-10-CM

## 2016-04-08 DIAGNOSIS — Z510 Encounter for antineoplastic radiation therapy: Secondary | ICD-10-CM | POA: Diagnosis not present

## 2016-04-08 MED ORDER — RADIAPLEXRX EX GEL
Freq: Once | CUTANEOUS | Status: AC
  Administered 2016-04-08: 16:00:00 via TOPICAL

## 2016-04-08 MED ORDER — ACETAMINOPHEN-CODEINE #4 300-60 MG PO TABS
1.0000 | ORAL_TABLET | Freq: Four times a day (QID) | ORAL | 0 refills | Status: DC | PRN
Start: 2016-04-08 — End: 2016-05-28

## 2016-04-08 NOTE — Progress Notes (Signed)
  Radiation Oncology         [phone]) [phone] ________________________________  Name: Veronica Reyes MRN: [id number]  Date: 04/08/2016  DOB: [birth year]    Weekly Radiation Therapy Management    ICD-9-CM  ICD-10-CM    1.  Breast cancer of lower-outer quadrant of right female breast ([name])  174.5  C50.511  hyaluronate sodium (RADIAPLEXRX) gel      Current Dose: 58.2 Gy     Planned Dose:  62.4 Gy  Narrative . . . . . . . . The patient presents for routine under treatment assessment.   Veronica Reyes has completed 32 fractions to her right breast.  She reports pain in her right breast and back as a 3/10.  She is taking 1 tablet of tylenol #4 at night which helps with the breast pain.  She is wondering if she can get a refill. It is filled by her PCP. She reports she has fatigue that comes and goes.  She is using radiaplex as directed and has been provided with a refill.  The skin on her right breast has hyperpigmentation.  She has dry peeling areas under her right arm and over her nipple area.                                 Set-up films were reviewed.                                 The chart was checked. Physical Findings. . .  height is 5\' 6"  (1.676 m) and weight is 288 lb 6.4 oz (130.8 kg). Her oral temperature is 98.6 F (37 C). Her blood pressure is 142/82 (abnormal) and her pulse is 75. Her oxygen saturation is 100%.   Lungs are clear to auscultation bilaterally. Heart has regular rate and rhythm. Right breast shows hyperpigmentation changes and mild dry desquamation. No moist desquamation.. Impression . . . . . . . The patient is tolerating radiation. Plan . . . . . . . . . . . . Continue treatment as planned. The patient has 2 more treatments. She will follow up in the clinic 1 month after she completes radiation. I refilled the patient's Tylenol #4.  ________________________________   Blair Promise, PhD, MD  This document serves as a record of services personally performed by  Gery Pray, MD. It was created on his behalf by Darcus Austin, a trained medical scribe. The creation of this record is based on the scribe's personal observations and the provider's statements to them. This document has been checked and approved by the attending provider.

## 2016-04-08 NOTE — Addendum Note (Signed)
Encounter addended by: Jacqulyn Liner, RN on: 04/08/2016  4:19 PM<BR>    Actions taken: [facility] administration accepted

## 2016-04-08 NOTE — Addendum Note (Signed)
Encounter addended by: Jacqulyn Liner, RN on: 04/08/2016  2:02 PM<BR>    Actions taken: Order Entry activity accessed, Diagnosis association updated

## 2016-04-08 NOTE — Progress Notes (Addendum)
Veronica Reyes has completed 32 fractions to her right breast.  She reports pain in her right breast and back at a 3/10.  She is taking 1 tablet of tylenol #4 at night which helps the breast pain.  She is wondering if she can get a refill.  She reports she has fatigue that comes and goes.  She is using radiaplex as directed and has been provided with a refill.  The skin on her right breast has hyperpigmentation.  She has dry peeling areas under her right arm and over her nipple area.  She has been given a one month follow up appointment.  BP (!) 142/82 (BP Location: Left Wrist, Patient Position: Sitting)   Pulse 75   Temp 98.6 F (37 C) (Oral)   Ht 5\' 6"  (1.676 m)   Wt 288 lb 6.4 oz (130.8 kg)   LMP 01/23/2014   SpO2 100%   BMI 46.55 kg/m    Wt Readings from Last 3 Encounters:  04/08/16 288 lb 6.4 oz (130.8 kg)  04/01/16 290 lb 3.2 oz (131.6 kg)  03/27/16 288 lb 4.8 oz (130.8 kg)

## 2016-04-09 ENCOUNTER — Ambulatory Visit: Payer: BLUE CROSS/BLUE SHIELD

## 2016-04-09 ENCOUNTER — Ambulatory Visit
Admission: RE | Admit: 2016-04-09 | Discharge: 2016-04-09 | Disposition: A | Discharge status: Home / Self Care | Payer: BLUE CROSS/BLUE SHIELD | Source: Ambulatory Visit | Attending: Radiation Oncology | Admitting: Radiation Oncology

## 2016-04-09 DIAGNOSIS — Z510 Encounter for antineoplastic radiation therapy: Secondary | ICD-10-CM | POA: Diagnosis not present

## 2016-04-10 ENCOUNTER — Encounter: Payer: Self-pay | Admitting: Radiation Oncology

## 2016-04-10 ENCOUNTER — Telehealth: Payer: Self-pay | Admitting: *Deleted

## 2016-04-10 ENCOUNTER — Ambulatory Visit
Admission: RE | Admit: 2016-04-10 | Discharge: 2016-04-10 | Disposition: A | Discharge status: Home / Self Care | Payer: BLUE CROSS/BLUE SHIELD | Source: Ambulatory Visit | Attending: Radiation Oncology | Admitting: Radiation Oncology

## 2016-04-10 ENCOUNTER — Ambulatory Visit: Payer: BLUE CROSS/BLUE SHIELD

## 2016-04-10 DIAGNOSIS — C50511 Malignant neoplasm of lower-outer quadrant of right female breast: Secondary | ICD-10-CM

## 2016-04-10 DIAGNOSIS — Z510 Encounter for antineoplastic radiation therapy: Secondary | ICD-10-CM | POA: Diagnosis not present

## 2016-04-10 NOTE — Telephone Encounter (Signed)
  Oncology Nurse Navigator Documentation    Navigator Encounter Type: Telephone (04/10/16 1400) Telephone: [place] Call (04/10/16 1400)         Patient Visit Type: [id number] (04/10/16 1400) Treatment Phase: Final Radiation Tx (04/10/16 1400)     Interventions: Referrals (04/10/16 1400) Referrals: Survivorship (04/10/16 1400)

## 2016-04-11 ENCOUNTER — Ambulatory Visit: Payer: BLUE CROSS/BLUE SHIELD

## 2016-04-16 ENCOUNTER — Other Ambulatory Visit: Payer: Self-pay | Admitting: *Deleted

## 2016-04-16 DIAGNOSIS — C50511 Malignant neoplasm of lower-outer quadrant of right female breast: Secondary | ICD-10-CM

## 2016-04-17 ENCOUNTER — Telehealth: Payer: Self-pay | Admitting: Hematology

## 2016-04-17 NOTE — Telephone Encounter (Signed)
pt did not have voicemail, apts will be mailed

## 2016-04-21 ENCOUNTER — Other Ambulatory Visit: Payer: Self-pay | Admitting: *Deleted

## 2016-04-21 ENCOUNTER — Other Ambulatory Visit: Payer: Self-pay | Admitting: Hematology

## 2016-04-21 ENCOUNTER — Ambulatory Visit: Payer: BLUE CROSS/BLUE SHIELD | Admitting: Hematology

## 2016-04-21 NOTE — Progress Notes (Signed)
  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   2) Right breast boost: 12 Gy in 6 fractions.  Beams/energy: 1) 3D // 10X Photon    2) Isodose Plan // 10X Photon  Narrative: The patient tolerated radiation treatment relatively well. She had mild pain in her right breast for which she took Tylenol #4. Towards the end of treatment, the right breast showed hyperpigmentation changes and mild dry desquamation with no moist desquamation.  Plan: The patient has completed radiation treatment. The patient will return to radiation oncology clinic for routine followup in one month. I advised them to call or return sooner if they have any questions or concerns related to their recovery or treatment.  -----------------------------------  Blair Promise, PhD, MD  This document serves as a record of services personally performed by Gery Pray, MD. It was created on his behalf by Darcus Austin, a trained medical scribe. The creation of this record is based on the scribe's personal observations and the provider's statements to them. This document has been checked and approved by the attending provider.

## 2016-04-21 NOTE — Progress Notes (Deleted)
[name] [facility] PROGRESS NOTE DATE OF VISIT: 04/21/2016   [name], FNP [address] [name] [id number]  DIAGNOSIS: recurrent No diagnosis found.  PROBLEM LIST 1. Left breast DCIS diagnosed in 2006 and 2010, status post lumpectomy. 2. pT2N0M0 stage I colon cancer, s/p partial colectomy on 03/09/2014  3. PE in 12/2013 when she was diagnosed with colon cancer. 4. 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 [facility])   Staging form: Breast, AJCC 7th Edition     Clinical stage from 02/01/2015: Stage IA (T1a, N0, M0) - Signed by [name], MD on 06/04/2015     Pathologic stage from 11/10/2015: Stage IA (T1b, N0, cM0) - Signed by [name], MD on 12/08/2015 Cancer of sigmoid colon [facility])   Staging form: Colon and Rectum, AJCC 7th Edition     Clinical: Stage I (T2, N0, M0) - Unsigned          Breast cancer of lower-outer quadrant of right female breast ([name])   2006 Cancer Diagnosis    Left breast DCIS diagnosed in 2006 and 2010, status post lumpectomy.     01/07/2014 Initial Diagnosis    Breast cancer     01/18/2015 Mammogram    2m mass in lower outer quadrant of right breast      02/01/2015 Initial Biopsy    right breast mass biopsy showed invasive ductal carcinoma      02/01/2015 Receptors her2    ER 90%+, PR 10%+, kI67 10%, HER2 pending      11/10/2015 Surgery    Left breast lumpectomy and sentinel lymph node biopsy     11/10/2015 Pathology Results    Left breast lumpectomy showed invasive ductal carcinoma, grade 2, 1 cm, low-grade DCIS, negative margins, 3 sentinel lymph nodes were negative. LVI (-)      11/10/2015 Oncotype testing    RS 22, which predicts a year risk of distant recurrence with tamoxifen alone 14%. Adjuvant chemo was not offered      [place]   03/04/2014 Initial Diagnosis    Cancer of sigmoid colon      03/09/2014 Pathology Results    G1 invasive adenocarcinoma, no lymph-Vascular invasion or Peri-neural invasion: Absent. MMR normal, MSI stable.      03/09/2014 Surgery    partial colectomy, negative margins.      11/10/2014 Relapse/Recurrence    biopsy confirmed oligo liver recurrence      11/10/2014 Imaging    PET showed two small ares of hypermetabolism in the right lobe of the liver, no other distant metastasis.      11/30/2014 Imaging    abdomen MRI showed a new enhancing lesion which corresponds to an area of abnormal hyper metabolism on recent PET-CT. Stable enlarged portal caval lymph node.      01/02/2015 Pathology Results    Liver biopsy showed metastatic adenocarcinoma, IHC (+) for CK20, CDX-2, (-) for TTF-1 and ER     01/02/2015 Miscellaneous    liver biopsy KRAS mutation (+)      02/14/2015 Imaging    CT showed:  the small metastatic right hepatic lobe liver lesion is not identified for certain on this examination. No new lesions. Stable small cysts in segment 4 a.      03/05/2015 - 04/16/2015 Chemotherapy    CAPEOX (capecitabine 2500 mg twice daily Day 1-14, oxaliplatin 1  30 mg/m on day 1, every 21 days, S/P 3 cycles      08/24/2015 Procedure    CT-guided oligo liver metastasis microwave ablation by Dr. [name]       CURRENT TREATMENT:  Pending radiation   INTERVAL HISTORY:  Veronica Reyes 60 y.o. female with a history of recurrent left breast cancer, colon cancer s/p laparoscopic-assisted partial colectomy (03/09/2014), PE (01/06/2014), and right breast cancer (01/2015),  presents to clinic for follow up.   She is doing well overall.  Right-sided flank pain for the past few months, which radiates to the right upper quadrant of abdomen.  It's intermittent, no associated nausea, change of bowel habits, or other new symptoms. She has been taking  Tylenol , which resolved the pain. The  Pain has not returned in the past few days. She otherwise feels well, has good appetite and  eating well, still taking Hamper oil,  She is anxious to start breast radiation,  But has not heard about her schedule yet.  She wants to lose some weight.  MEDICAL HISTORY: Past Medical History:  Diagnosis Date  . Anxiety   . Breast CA ([place])    radiation and surgery-lt.  . Cancer ([place])    left breast cancer x2  . Colon cancer ([place])    Dr. [place] Medico seeing monthly  . Hypertension   . Liver lesion   . Pulmonary embolism ([place])    "blood clot in lungs" -dx. 4'15 with CT Chest. On Xarelto  . Radiation 01/22/10-03/12/10   left whole breast 4500 cGy, upper aspect boosted to 6120 cGy  . Shortness of breath    sob with exertion. dx. with Pulmonary emboli 4'15 ,tx. Xarelto,Lovenox used for [employer].    ALLERGIES:  is allergic to tramadol.  MEDICATIONS:    Medication List       Accurate as of 04/21/16  7:23 AM. Always use your most recent med list.          acetaminophen 500 MG tablet Commonly known as:  TYLENOL Take 500 mg by mouth every 6 (six) hours as needed. Reported on 03/11/2016   acetaminophen-codeine 300-60 MG tablet Commonly known as:  TYLENOL #4 Take 1 tablet by mouth every 6 (six) hours as needed. for pain   amLODipine 2.5 MG tablet Commonly known as:  NORVASC Take 2.5 mg by mouth daily.   ferrous sulfate 325 (65 FE) MG tablet Take 325 mg by mouth daily with breakfast.   hyaluronate sodium Gel Apply 1 application topically once. Reported on 03/11/2016   hydrochlorothiazide 25 MG tablet Commonly known as:  HYDRODIURIL Take 1 tablet (25 mg total) by mouth every morning.   metoprolol 50 MG tablet Commonly known as:  LOPRESSOR Take 1 tablet (50 mg total) by mouth 2 (two) times daily.   non-metallic deodorant Misc Commonly known as:  ALRA Apply 1 application topically daily as needed.   potassium chloride SA 20 MEQ tablet Commonly known as:  K-DUR,KLOR-CON Take 1 tablet (20 mEq total) by mouth daily.   XARELTO 20 MG Tabs tablet Generic drug:  rivaroxaban Take 1  tablet by mouth daily with supper       SURGICAL HISTORY:  Past Surgical History:  Procedure Laterality Date  . BREAST LUMPECTOMY WITH RADIOACTIVE SEED AND SENTINEL LYMPH NODE BIOPSY Right 11/07/2015   Procedure: BREAST LUMPECTOMY WITH RADIOACTIVE SEED AND SENTINEL LYMPH NODE BIOPSY;  Surgeon: Stark Klein, MD;  Location: [facility];  Service: General;  Laterality: Right;  . BREAST  SURGERY     '11- Left breast lumpectomy  . CESAREAN SECTION    . CHOLECYSTECTOMY     Laparoscopic 20 yrs ago  . COLON SURGERY  03-09-14   partial colectomy for cancer  . PARTIAL COLECTOMY N/A 03/09/2014   Procedure: LAPAROSCOPIC ASSISTED PARTIAL COLECTOMY, splenic flexure mobilization;  Surgeon: Leighton Ruff, MD;  Location: [facility];  Service: General;  Laterality: N/A;  . RADIOFREQUENCY ABLATION Right 08/24/2015   Procedure: MICROWAVE ABLATION RIGHT LIVER LOBE ;  Surgeon: Corrie Mckusick, DO;  Location: [facility];  Service: Anesthesiology;  Laterality: Right;    REVIEW OF SYSTEMS:   Constitutional: Denies fevers, chills or abnormal weight loss Eyes: Denies blurriness of vision Ears, nose, mouth, throat, and face: Denies mucositis or sore throat Respiratory: Denies cough, dyspnea or wheezes Cardiovascular: Denies palpitation, chest discomfort or lower extremity swelling Gastrointestinal:  Denies nausea, heartburn or change in bowel habits Skin: Denies abnormal skin rashes Lymphatics: Denies new lymphadenopathy or easy bruising Neurological:Denies numbness, tingling or new weaknesses Behavioral/Psych: Mood is stable, no new changes  All other systems were reviewed with the patient and are negative. Breasts: Breast inspection showed them to be symmetrical with no nipple discharge. Palpation of the breasts and axilla revealed no obvious mass that I could appreciate.   Old surgical scar in the left breast. (+)  Skin erythema below both breasts   PHYSICAL EXAMINATION: ECOG PERFORMANCE STATUS: 1 Last  menstrual period 01/23/2014. GENERAL:alert, no distress and comfortable; well developed and obese. Easily mobile to exam table.  SKIN: skin color, texture, turgor are normal, no rashes or significant lesions EYES: normal, Conjunctiva are pink and non-injected, sclera clear OROPHARYNX:no exudate, no erythema and lips, buccal mucosa, and tongue normal  NECK: supple, thyroid normal size, non-tender, without nodularity LYMPH:  no palpable lymphadenopathy in the cervical, axillary or supraclavicular LUNGS: clear to auscultation with normal breathing effort, no wheezes or rhonchi HEART: regular rate & rhythm and no murmurs and no lower extremity edema ABDOMEN:abdomen soft, non-tender and normal bowel sounds; incision healing with signs of infection.  Mild tenderness at the  Right flank area, no abdominal tenderness. No local megaly Musculoskeletal:no cyanosis of digits and no clubbing  NEURO: alert & oriented x 3 with fluent speech, no focal motor/sensory deficits Breasts: Breast inspection showed them to be symmetrical with no nipple discharge. The surgical scar in her right breast and axilla have healed very well. Palpation of the breasts and axilla revealed no obvious mass that I could appreciate.   Labs:  CBC Latest Ref Rng & Units 03/27/2016 01/31/2016 12/13/2015  WBC 3.9 - 10.3 10e3/uL 10.1 9.1 8.1  Hemoglobin 11.6 - 15.9 g/dL 14.7 13.9 14.7  Hematocrit 34.8 - 46.6 % 44.1 40.5 43.2  Platelets 145 - 400 10e3/uL 255 239 214    CMP Latest Ref Rng & Units 03/27/2016 01/31/2016 12/13/2015  Glucose 70 - 140 mg/dl 100 110 105  BUN 7.0 - 26.0 mg/dL 9.5 8.9 10.8  Creatinine 0.6 - 1.1 mg/dL 0.9 0.9 0.9  Sodium 136 - 145 mEq/L 141 140 140  Potassium 3.5 - 5.1 mEq/L 3.8 3.5 3.6  Chloride 101 - 111 mmol/L - - -  CO2 22 - 29 mEq/L '28 28 28  ' Calcium 8.4 - 10.4 mg/dL 9.8 9.6 9.7  Total Protein 6.4 - 8.3 g/dL 8.4(H) 8.0 8.1  Total Bilirubin 0.20 - 1.20 mg/dL 0.77 0.58 0.62  Alkaline Phos 40 - 150 U/L 53  46 47  AST 5 - 34 U/L 53(H) 40(H)  41(H)  ALT 0 - 55 U/L 46 30 36    PATHOLOGY RESULT Diagnosis 01/02/2015 Liver, needle/core biopsy, right - POSITIVE FOR METASTATIC ADENOCARCINOMA. - SEE COMMENT. Microscopic Comment Immunohistochemical stains are performed. The tumor is positive for cytokeratin 20 and CDX-2. Cytokeratin 7 stains background bile ducts but is negative within the tumor. The tumor is negative for TTF-1 and estrogen receptor. The morphology coupled with the staining pattern is consistent with metastatic adenocarcinoma of colorectal primary source.   Diagnosis 11/07/2015 1. Breast, lumpectomy, Right - INVASIVE DUCTAL CARCINOMA, GRADE 2, SPANNING 1 CM. - DUCTAL CARCINOMA IN SITU, LOW GRADE. - RESECTION MARGINS ARE NEGATIVE FOR INVASIVE CARCINOMA. - DUCTAL CARCINOMA IN SITU COMES TO WITHIN 0.3 CM OF THE POSTERIOR MARGIN. - BIOPSY SITE. - SEE ONCOLOGY TABLE. 2. Lymph node, sentinel, biopsy, right axillary #1 - ONE OF ONE LYMPH NODES NEGATIVE FOR CARCINOMA (0/1). 3. Lymph node, sentinel, biopsy, right axillary #2 - ONE OF ONE LYMPH NODES NEGATIVE FOR CARCINOMA (0/1). 4. Lymph node, sentinel, biopsy, right axillary #3 - ONE OF ONE LYMPH NODES NEGATIVE FOR CARCINOMA (0/1). Microscopic Comment 1. BREAST, INVASIVE TUMOR, WITH LYMPH NODES PRESENT Specimen, including laterality and lymph node sampling (sentinel, non-sentinel): Right breast lump and right axillary sentinel lymph nodes. Procedure: Right breast lumpectomy and right axillary sentinel lymph node biopsy. Histologic type: Invasive ductal carcinoma. Grade: 2. Tubule formation: 3 Nuclear pleomorphism: 2 Mitotic:1 Tumor size (gross measurement): 1 cm Margins: Invasive, distance to closest margin: 0.2 cm (posterior). In-situ, distance to closest margin: 0.3 cm (posterior, focal). If margin positive, focally or broadly: N/A. Lymphovascular invasion: Not identified. Ductal carcinoma in situ: Present. Grade:  I Extensive intraductal component: No. Lobular neoplasia: Not identified. Tumor focality: Unifocal. Treatment effect: N/A. Extent of tumor: Confined to breast parenchyma. Lymph nodes: Examined: 3 Sentinel 0 Non-sentinel 3 Total Lymph nodes with metastasis: 0 Isolated tumor cells (< 0.2 mm): 0 Micrometastasis: (> 0.2 mm and < 2.0 mm): 0 Macrometastasis: (> 2.0 mm): 0 Extracapsular extension: N/A. Breast prognostic profile: Performed on biopsy ([id number]), see below. Her2 will be repeated on the current specimen. Estrogen receptor: Positive, 90%, strong staining intensity. Progesterone receptor: Positive, 10%, strong staining intensity. Her 2 neu: Negative. Ki-67: 10%. Non-neoplastic breast: Fibrocystic change and usual ductal hyperplasia. Biopsy site. TNM: pT1b, pN0 Comments: None  ONCOTYPE DX: RS 22, which predicts a year risk of distant recurrence with tamoxifen alone 14%  Results: IMMUNOHISTOCHEMICAL AND MORPHOMETRIC ANALYSIS BY THE AUTOMATED CELLULAR IMAGING SYSTEM (ACIS) Estrogen Receptor: 90%, POSITIVE, STRONG STAINING INTENSITY (PERFORMED MANUALLY) Progesterone Receptor: 10%, POSITIVE, STRONG STAINING INTENSITY (PERFORMED MANUALLY) Proliferation Marker Ki67: 10% 1. FLUORESCENCE IN-SITU HYBRIDIZATION Results: HER2 - NEGATIVE RATIO OF HER2/CEP17 SIGNALS 1.29 AVERAGE HER2 COPY NUMBER PER CELL 1.80  RADIOLOGY STUDIES   Abdomen MRI w wo contrast 05/29/2015  IMPRESSION: 1. Mild degradation secondary to patient body habitus. 2. Isolated segment 6 subcapsular liver lesion is similar. No new or progressive disease identified. 3. Interpolar right renal lesion is favored to represent a complex/septated cyst and is not significantly changed in size.  CT guided tissue ablation by Dr. Earleen Newport 08/24/2015 IMPRESSION: Status post image guided microwave ablation of solitary metastasis in the right liver lobe in this patient with oligo metastatic colorectal cancer.   CT CAP  with contrast 12/18/2015 IMPRESSION: Expected appearance of ablation defect at site of previously demonstrated posterior right hepatic lobe metastasis. No definite evidence of residual or progressive metastatic disease.  Stable 3 mm left lower lobe pulmonary nodule. Continued attention recommended on  follow-up CT.  Stable enlarged myomatous uterus.   ASSESSMENT: Sharolyn Douglas 60 y.o. female with a history of  1. New right breast cancer, pT1bN0M0, stage Ia, ER+/PR+/HER2-, History of left Breast Cancer, diagnosed on 02/01/2015 --Left CIS in situ, s/p lumpectomy in 2006 and 2011 by Dr. Margot Chimes, and radiation in 2011 by Dr. Sondra Come.  -I reviewed her surgical pathology results from 11/10/2015, which showed a 1 cm grade 2 invasive ductal carcinoma and DCIS. Surgical margins were negative. -I reviewed her Oncotype DX result. Her recurrence score is 22, which predicts 14% 10 year risk of distant recurrence with tamoxifen alone. This is intermediate risk, based on her relatively low number, I do not recommend adjuvant chemotherapy. -Giving the strong ER/PR positivity, and previous multiple breast cancer, I strongly recommend her to take aromatase inhibitor as adjuvant endocrine therapy. This will start after her radiation. Potential side effects were reviewed with her, she is agreeable. - she was seen by a radiation oncologist Dr. Sondra Come 2 weeks ago, and will start radiation soon. I sent a message to Dr. Sondra Come today  To see if they can start her as soon as possible.  2.Colon Cancer, Stage I (pT2N0), MMR normal, oligo recurrent disease in liver in 12/2014, KRAS mutation (+) -I previously reviewed the nature history of metastatic colon cancer, and the potential curative approach with chemotherapy followed by liver lesion ablation or resection. However, more likely, this is not curable disease. -She is status post 3 cycles of Capeox, does not want to proceed with fourth cycle due to the moderate side  effects from previous chemotherapy treatment. She declined liver mass resection, and underwent liver ablation by interventional radiologist Dr. Earleen Newport in 08/2015. - I reviewed her restaging CT scan from April 2017, which showed no evidence of recurrence. The previously ablated liver lesion appears stable, no suspicion for residual disease or recurrence.   3. History of PE (01/06/2014) --She has a diagnosis of pulmonary embolism, likely secondary to malignancy in the setting of family history for stroke (and possible stroke history in patient as well). Lower extremity dopplers negative.  -continue Xarelto. Patient asked about if she can come off Xarelto. If she has no evidence of cancer in 3-6 months, I can potentially take her off Xarelto. She understands she has moderate to high risk of recurrent thrombosis if she is off anticoagulation.  4.  Uterine Fibroids  --Negative endometrial biopsy. Following with Gynecology.  5. Kidney lesion (Right). -No hypermetabolic right kidney lesion on the pet scan -Repeat abdominal MRI 05/29/2015 showed unchanged right renal lesion, favored to represent a complex/septated cyst.  6. Right flank pain - possible muscular pain , she does have tender point.  - Recent CT scan reviewed no evidence of cancer recurrence, her renal cysts are stable.  7.  Morbid obesity - I encouraged her to eat healthy and exercise more. - I encouraged her to participate the " living well"  Program in our cancer center,  Which is designed for breast cancer survivor to improve your diet and exercise.  Plan  - she will start breast radiation soon -I will see her back in 2 months to start her on  Adjuvant antiestrogen therapy,  After she completes breast radiation.  All questions were answered. The patient knows to call the clinic with any problems, questions or concerns. We can certainly see the patient much sooner if necessary.  I spent 25 minutes counseling the patient face to  face. The total time spent in the appointment  was 30 minutes.   [name]  04/21/2016

## 2016-04-23 ENCOUNTER — Telehealth: Payer: Self-pay | Admitting: Hematology

## 2016-04-23 NOTE — Telephone Encounter (Signed)
Pt voicemail is not set up. Unable to inform pt of r/s appt per pof on 8/15. Letter mailed 8/9 and pt may see appt on MyChart

## 2016-04-29 ENCOUNTER — Ambulatory Visit: Payer: BLUE CROSS/BLUE SHIELD | Admitting: Hematology

## 2016-04-29 NOTE — Progress Notes (Deleted)
[facility] PROGRESS NOTE DATE OF VISIT: 04/29/2016   [name], FNP [address] [place] [id number]  DIAGNOSIS: recurrent No diagnosis found.  PROBLEM LIST 1. Left breast DCIS diagnosed in 2006 and 2010, status post lumpectomy. 2. pT2N0M0 stage I colon cancer, s/p partial colectomy on 03/09/2014  3. PE in 12/2013 when she was diagnosed with colon cancer. 4. 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 [facility])   Staging form: Breast, AJCC 7th Edition     Clinical stage from 02/01/2015: Stage IA (T1a, N0, M0) - Signed by [name], MD on 06/04/2015     Pathologic stage from 11/10/2015: Stage IA (T1b, N0, cM0) - Signed by [name], MD on 12/08/2015 Cancer of sigmoid colon [facility])   Staging form: Colon and Rectum, AJCC 7th Edition     Clinical: Stage I (T2, N0, M0) - Unsigned          Breast cancer of lower-outer quadrant of right female breast ([place])   2006 Cancer Diagnosis    Left breast DCIS diagnosed in 2006 and 2010, status post lumpectomy.     01/07/2014 Initial Diagnosis    Breast cancer     01/18/2015 Mammogram    39m mass in lower outer quadrant of right breast      02/01/2015 Initial Biopsy    right breast mass biopsy showed invasive ductal carcinoma      02/01/2015 Receptors her2    ER 90%+, PR 10%+, kI67 10%, HER2 pending      11/10/2015 Surgery    Left breast lumpectomy and sentinel lymph node biopsy     11/10/2015 Pathology Results    Left breast lumpectomy showed invasive ductal carcinoma, grade 2, 1 cm, low-grade DCIS, negative margins, 3 sentinel lymph nodes were negative. LVI (-)      11/10/2015 Oncotype testing    RS 22, which predicts a year risk of distant recurrence with tamoxifen alone 14%. Adjuvant chemo was not offered     02/21/2016 - 04/10/2016 Radiation Therapy    adjuvant breast radiation       [place]   03/04/2014 Initial Diagnosis    Cancer of sigmoid colon     03/09/2014 Pathology Results    G1 invasive adenocarcinoma, no lymph-Vascular invasion or Peri-neural invasion: Absent. MMR normal, MSI stable.      03/09/2014 Surgery    partial colectomy, negative margins.      11/10/2014 Relapse/Recurrence    biopsy confirmed oligo liver recurrence      11/10/2014 Imaging    PET showed two small ares of hypermetabolism in the right lobe of the liver, no other distant metastasis.      11/30/2014 Imaging    abdomen MRI showed a new enhancing lesion which corresponds to an area of abnormal hyper metabolism on recent PET-CT. Stable enlarged portal caval lymph node.      01/02/2015 Pathology Results    Liver biopsy showed metastatic adenocarcinoma, IHC (+) for CK20, CDX-2, (-) for TTF-1 and ER     01/02/2015 Miscellaneous    liver biopsy KRAS mutation (+)      02/14/2015 Imaging    CT showed:  the small metastatic right hepatic lobe liver lesion is not identified for certain on this examination. No new lesions. Stable small cysts in segment 4 a.      03/05/2015 -  04/16/2015 Chemotherapy    CAPEOX (capecitabine 2500 mg twice daily Day 1-14, oxaliplatin 1 30 mg/m on day 1, every 21 days, S/P 3 cycles      08/24/2015 Procedure    CT-guided oligo liver metastasis microwave ablation by Dr. [name]       CURRENT TREATMENT:  Pending radiation   INTERVAL HISTORY:  Veronica Reyes 60 y.o. female with a history of recurrent left breast cancer, colon cancer s/p laparoscopic-assisted partial colectomy (03/09/2014), PE (01/06/2014), and right breast cancer (01/2015),  presents to clinic for follow up.   She is doing well overall.  Right-sided flank pain for the past few months, which radiates to the right upper quadrant of abdomen.  It's intermittent, no associated nausea, change of bowel habits, or other new symptoms. She has been taking  Tylenol , which resolved the pain. The  Pain has not  returned in the past few days. She otherwise feels well, has good appetite and eating well, still taking Hamper oil,  She is anxious to start breast radiation,  But has not heard about her schedule yet.  She wants to lose some weight.  MEDICAL HISTORY: Past Medical History:  Diagnosis Date  . Anxiety   . Breast CA ([place])    radiation and surgery-lt.  . Cancer ([place])    left breast cancer x2  . Colon cancer ([place])    Dr. Burr Medico seeing monthly  . Hypertension   . Liver lesion   . Pulmonary embolism ([place])    "blood clot in lungs" -dx. 4'15 with CT Chest. On Xarelto  . Radiation 01/22/10-03/12/10   left whole breast 4500 cGy, upper aspect boosted to 6120 cGy  . Shortness of breath    sob with exertion. dx. with Pulmonary emboli 4'15 ,tx. Xarelto,Lovenox used for [employer].    ALLERGIES:  is allergic to tramadol.  MEDICATIONS:    Medication List       Accurate as of 04/29/16  7:25 AM. Always use your most recent med list.          acetaminophen 500 MG tablet Commonly known as:  TYLENOL Take 500 mg by mouth every 6 (six) hours as needed. Reported on 03/11/2016   acetaminophen-codeine 300-60 MG tablet Commonly known as:  TYLENOL #4 Take 1 tablet by mouth every 6 (six) hours as needed. for pain   amLODipine 2.5 MG tablet Commonly known as:  NORVASC Take 2.5 mg by mouth daily.   ferrous sulfate 325 (65 FE) MG tablet Take 325 mg by mouth daily with breakfast.   hyaluronate sodium Gel Apply 1 application topically once. Reported on 03/11/2016   hydrochlorothiazide 25 MG tablet Commonly known as:  HYDRODIURIL Take 1 tablet (25 mg total) by mouth every morning.   metoprolol 50 MG tablet Commonly known as:  LOPRESSOR Take 1 tablet (50 mg total) by mouth 2 (two) times daily.   non-metallic deodorant Misc Commonly known as:  ALRA Apply 1 application topically daily as needed.   potassium chloride SA 20 MEQ tablet Commonly known as:  K-DUR,KLOR-CON Take 1 tablet (20 mEq total) by  mouth daily.   XARELTO 20 MG Tabs tablet Generic drug:  rivaroxaban Take 1 tablet by mouth daily with supper       SURGICAL HISTORY:  Past Surgical History:  Procedure Laterality Date  . BREAST LUMPECTOMY WITH RADIOACTIVE SEED AND SENTINEL LYMPH NODE BIOPSY Right 11/07/2015   Procedure: BREAST LUMPECTOMY WITH RADIOACTIVE SEED AND SENTINEL LYMPH NODE BIOPSY;  Surgeon: Stark Klein, MD;  Location: [facility];  Service: General;  Laterality: Right;  . BREAST SURGERY     '11- Left breast lumpectomy  . CESAREAN SECTION    . CHOLECYSTECTOMY     Laparoscopic 20 yrs ago  . COLON SURGERY  03-09-14   partial colectomy for cancer  . PARTIAL COLECTOMY N/A 03/09/2014   Procedure: LAPAROSCOPIC ASSISTED PARTIAL COLECTOMY, splenic flexure mobilization;  Surgeon: Leighton Ruff, MD;  Location: [facility];  Service: General;  Laterality: N/A;  . RADIOFREQUENCY ABLATION Right 08/24/2015   Procedure: MICROWAVE ABLATION RIGHT LIVER LOBE ;  Surgeon: Corrie Mckusick, DO;  Location: [facility];  Service: Anesthesiology;  Laterality: Right;    REVIEW OF SYSTEMS:   Constitutional: Denies fevers, chills or abnormal weight loss Eyes: Denies blurriness of vision Ears, nose, mouth, throat, and face: Denies mucositis or sore throat Respiratory: Denies cough, dyspnea or wheezes Cardiovascular: Denies palpitation, chest discomfort or lower extremity swelling Gastrointestinal:  Denies nausea, heartburn or change in bowel habits Skin: Denies abnormal skin rashes Lymphatics: Denies new lymphadenopathy or easy bruising Neurological:Denies numbness, tingling or new weaknesses Behavioral/Psych: Mood is stable, no new changes  All other systems were reviewed with the patient and are negative. Breasts: Breast inspection showed them to be symmetrical with no nipple discharge. Palpation of the breasts and axilla revealed no obvious mass that I could appreciate.   Old surgical scar in the left breast. (+)  Skin erythema  below both breasts   PHYSICAL EXAMINATION: ECOG PERFORMANCE STATUS: 1 Last menstrual period 01/23/2014. GENERAL:alert, no distress and comfortable; well developed and obese. Easily mobile to exam table.  SKIN: skin color, texture, turgor are normal, no rashes or significant lesions EYES: normal, Conjunctiva are pink and non-injected, sclera clear OROPHARYNX:no exudate, no erythema and lips, buccal mucosa, and tongue normal  NECK: supple, thyroid normal size, non-tender, without nodularity LYMPH:  no palpable lymphadenopathy in the cervical, axillary or supraclavicular LUNGS: clear to auscultation with normal breathing effort, no wheezes or rhonchi HEART: regular rate & rhythm and no murmurs and no lower extremity edema ABDOMEN:abdomen soft, non-tender and normal bowel sounds; incision healing with signs of infection.  Mild tenderness at the  Right flank area, no abdominal tenderness. No local megaly Musculoskeletal:no cyanosis of digits and no clubbing  NEURO: alert & oriented x 3 with fluent speech, no focal motor/sensory deficits Breasts: Breast inspection showed them to be symmetrical with no nipple discharge. The surgical scar in her right breast and axilla have healed very well. Palpation of the breasts and axilla revealed no obvious mass that I could appreciate.   Labs:  CBC Latest Ref Rng & Units 03/27/2016 01/31/2016 12/13/2015  WBC 3.9 - 10.3 10e3/uL 10.1 9.1 8.1  Hemoglobin 11.6 - 15.9 g/dL 14.7 13.9 14.7  Hematocrit 34.8 - 46.6 % 44.1 40.5 43.2  Platelets 145 - 400 10e3/uL 255 239 214    CMP Latest Ref Rng & Units 03/27/2016 01/31/2016 12/13/2015  Glucose 70 - 140 mg/dl 100 110 105  BUN 7.0 - 26.0 mg/dL 9.5 8.9 10.8  Creatinine 0.6 - 1.1 mg/dL 0.9 0.9 0.9  Sodium 136 - 145 mEq/L 141 140 140  Potassium 3.5 - 5.1 mEq/L 3.8 3.5 3.6  Chloride 101 - 111 mmol/L - - -  CO2 22 - 29 mEq/L '28 28 28  ' Calcium 8.4 - 10.4 mg/dL 9.8 9.6 9.7  Total Protein 6.4 - 8.3 g/dL 8.4(H) 8.0 8.1   Total Bilirubin 0.20 - 1.20 mg/dL 0.77 0.58 0.62  Alkaline Phos 40 -  150 U/L 53 46 47  AST 5 - 34 U/L 53(H) 40(H) 41(H)  ALT 0 - 55 U/L 46 30 36    PATHOLOGY RESULT Diagnosis 01/02/2015 Liver, needle/core biopsy, right - POSITIVE FOR METASTATIC ADENOCARCINOMA. - SEE COMMENT. Microscopic Comment Immunohistochemical stains are performed. The tumor is positive for cytokeratin 20 and CDX-2. Cytokeratin 7 stains background bile ducts but is negative within the tumor. The tumor is negative for TTF-1 and estrogen receptor. The morphology coupled with the staining pattern is consistent with metastatic adenocarcinoma of colorectal primary source.   Diagnosis 11/07/2015 1. Breast, lumpectomy, Right - INVASIVE DUCTAL CARCINOMA, GRADE 2, SPANNING 1 CM. - DUCTAL CARCINOMA IN SITU, LOW GRADE. - RESECTION MARGINS ARE NEGATIVE FOR INVASIVE CARCINOMA. - DUCTAL CARCINOMA IN SITU COMES TO WITHIN 0.3 CM OF THE POSTERIOR MARGIN. - BIOPSY SITE. - SEE ONCOLOGY TABLE. 2. Lymph node, sentinel, biopsy, right axillary #1 - ONE OF ONE LYMPH NODES NEGATIVE FOR CARCINOMA (0/1). 3. Lymph node, sentinel, biopsy, right axillary #2 - ONE OF ONE LYMPH NODES NEGATIVE FOR CARCINOMA (0/1). 4. Lymph node, sentinel, biopsy, right axillary #3 - ONE OF ONE LYMPH NODES NEGATIVE FOR CARCINOMA (0/1). Microscopic Comment 1. BREAST, INVASIVE TUMOR, WITH LYMPH NODES PRESENT Specimen, including laterality and lymph node sampling (sentinel, non-sentinel): Right breast lump and right axillary sentinel lymph nodes. Procedure: Right breast lumpectomy and right axillary sentinel lymph node biopsy. Histologic type: Invasive ductal carcinoma. Grade: 2. Tubule formation: 3 Nuclear pleomorphism: 2 Mitotic:1 Tumor size (gross measurement): 1 cm Margins: Invasive, distance to closest margin: 0.2 cm (posterior). In-situ, distance to closest margin: 0.3 cm (posterior, focal). If margin positive, focally or broadly:  N/A. Lymphovascular invasion: Not identified. Ductal carcinoma in situ: Present. Grade: I Extensive intraductal component: No. Lobular neoplasia: Not identified. Tumor focality: Unifocal. Treatment effect: N/A. Extent of tumor: Confined to breast parenchyma. Lymph nodes: Examined: 3 Sentinel 0 Non-sentinel 3 Total Lymph nodes with metastasis: 0 Isolated tumor cells (< 0.2 mm): 0 Micrometastasis: (> 0.2 mm and < 2.0 mm): 0 Macrometastasis: (> 2.0 mm): 0 Extracapsular extension: N/A. Breast prognostic profile: Performed on biopsy ([id number]), see below. Her2 will be repeated on the current specimen. Estrogen receptor: Positive, 90%, strong staining intensity. Progesterone receptor: Positive, 10%, strong staining intensity. Her 2 neu: Negative. Ki-67: 10%. Non-neoplastic breast: Fibrocystic change and usual ductal hyperplasia. Biopsy site. TNM: pT1b, pN0 Comments: None  ONCOTYPE DX: RS 22, which predicts a year risk of distant recurrence with tamoxifen alone 14%  Results: IMMUNOHISTOCHEMICAL AND MORPHOMETRIC ANALYSIS BY THE AUTOMATED CELLULAR IMAGING SYSTEM (ACIS) Estrogen Receptor: 90%, POSITIVE, STRONG STAINING INTENSITY (PERFORMED MANUALLY) Progesterone Receptor: 10%, POSITIVE, STRONG STAINING INTENSITY (PERFORMED MANUALLY) Proliferation Marker Ki67: 10% 1. FLUORESCENCE IN-SITU HYBRIDIZATION Results: HER2 - NEGATIVE RATIO OF HER2/CEP17 SIGNALS 1.29 AVERAGE HER2 COPY NUMBER PER CELL 1.80  RADIOLOGY STUDIES   Abdomen MRI w wo contrast 05/29/2015  IMPRESSION: 1. Mild degradation secondary to patient body habitus. 2. Isolated segment 6 subcapsular liver lesion is similar. No new or progressive disease identified. 3. Interpolar right renal lesion is favored to represent a complex/septated cyst and is not significantly changed in size.  CT guided tissue ablation by Dr. Earleen Newport 08/24/2015 IMPRESSION: Status post image guided microwave ablation of solitary metastasis in  the right liver lobe in this patient with oligo metastatic colorectal cancer.   CT CAP with contrast 12/18/2015 IMPRESSION: Expected appearance of ablation defect at site of previously demonstrated posterior right hepatic lobe metastasis. No definite evidence of residual or progressive metastatic disease.  Stable 3 mm left lower lobe pulmonary nodule. Continued attention recommended on follow-up CT.  Stable enlarged myomatous uterus.   ASSESSMENT: Veronica Reyes 60 y.o. female with a history of  1. New right breast cancer, pT1bN0M0, stage Ia, ER+/PR+/HER2-, History of left Breast Cancer, diagnosed on 02/01/2015 --Left CIS in situ, s/p lumpectomy in 2006 and 2011 by Dr. Margot Chimes, and radiation in 2011 by Dr. Sondra Come.  -I reviewed her surgical pathology results from 11/10/2015, which showed a 1 cm grade 2 invasive ductal carcinoma and DCIS. Surgical margins were negative. -I reviewed her Oncotype DX result. Her recurrence score is 22, which predicts 14% 10 year risk of distant recurrence with tamoxifen alone. This is intermediate risk, based on her relatively low number, I do not recommend adjuvant chemotherapy. -Giving the strong ER/PR positivity, and previous multiple breast cancer, I strongly recommend her to take aromatase inhibitor as adjuvant endocrine therapy. This will start after her radiation. Potential side effects were reviewed with her, she is agreeable. - she was seen by a radiation oncologist Dr. Sondra Come 2 weeks ago, and will start radiation soon. I sent a message to Dr. Sondra Come today  To see if they can start her as soon as possible.  2.Colon Cancer, Stage I (pT2N0), MMR normal, oligo recurrent disease in liver in 12/2014, KRAS mutation (+) -I previously reviewed the nature history of metastatic colon cancer, and the potential curative approach with chemotherapy followed by liver lesion ablation or resection. However, more likely, this is not curable disease. -She is status post  3 cycles of Capeox, does not want to proceed with fourth cycle due to the moderate side effects from previous chemotherapy treatment. She declined liver mass resection, and underwent liver ablation by interventional radiologist Dr. Earleen Newport in 08/2015. - I reviewed her restaging CT scan from April 2017, which showed no evidence of recurrence. The previously ablated liver lesion appears stable, no suspicion for residual disease or recurrence.   3. History of PE (01/06/2014) --She has a diagnosis of pulmonary embolism, likely secondary to malignancy in the setting of family history for stroke (and possible stroke history in patient as well). Lower extremity dopplers negative.  -continue Xarelto. Patient asked about if she can come off Xarelto. If she has no evidence of cancer in 3-6 months, I can potentially take her off Xarelto. She understands she has moderate to high risk of recurrent thrombosis if she is off anticoagulation.  4.  Uterine Fibroids  --Negative endometrial biopsy. Following with Gynecology.  5. Kidney lesion (Right). -No hypermetabolic right kidney lesion on the pet scan -Repeat abdominal MRI 05/29/2015 showed unchanged right renal lesion, favored to represent a complex/septated cyst.  6. Right flank pain - possible muscular pain , she does have tender point.  - Recent CT scan reviewed no evidence of cancer recurrence, her renal cysts are stable.  7.  Morbid obesity - I encouraged her to eat healthy and exercise more. - I encouraged her to participate the " living well"  Program in our cancer center,  Which is designed for breast cancer survivor to improve your diet and exercise.  Plan  - she will start breast radiation soon -I will see her back in 2 months to start her on  Adjuvant antiestrogen therapy,  After she completes breast radiation.  All questions were answered. The patient knows to call the clinic with any problems, questions or concerns. We can certainly see the  patient much sooner if necessary.  I spent 25 minutes counseling  the patient face to face. The total time spent in the appointment was 30 minutes.   [name]  04/29/2016

## 2016-05-12 ENCOUNTER — Encounter: Payer: Self-pay | Admitting: Oncology

## 2016-05-15 ENCOUNTER — Ambulatory Visit: Payer: BLUE CROSS/BLUE SHIELD | Admitting: Radiation Oncology

## 2016-05-28 ENCOUNTER — Telehealth: Payer: Self-pay | Admitting: Hematology

## 2016-05-28 ENCOUNTER — Ambulatory Visit
Admission: RE | Admit: 2016-05-28 | Payer: BLUE CROSS/BLUE SHIELD | Source: Ambulatory Visit | Admitting: Radiation Oncology

## 2016-05-28 ENCOUNTER — Telehealth: Payer: Self-pay | Admitting: Oncology

## 2016-05-28 ENCOUNTER — Ambulatory Visit (HOSPITAL_BASED_OUTPATIENT_CLINIC_OR_DEPARTMENT_OTHER): Payer: BLUE CROSS/BLUE SHIELD | Admitting: Hematology

## 2016-05-28 ENCOUNTER — Ambulatory Visit: Payer: BLUE CROSS/BLUE SHIELD | Admitting: Radiation Oncology

## 2016-05-28 ENCOUNTER — Other Ambulatory Visit (HOSPITAL_BASED_OUTPATIENT_CLINIC_OR_DEPARTMENT_OTHER): Payer: BLUE CROSS/BLUE SHIELD

## 2016-05-28 ENCOUNTER — Encounter: Payer: Self-pay | Admitting: Hematology

## 2016-05-28 VITALS — BP 128/79 | HR 63 | Temp 98.9°F | Resp 17 | Ht 66.0 in | Wt 288.5 lb

## 2016-05-28 DIAGNOSIS — Z780 Asymptomatic menopausal state: Secondary | ICD-10-CM | POA: Diagnosis not present

## 2016-05-28 DIAGNOSIS — C187 Malignant neoplasm of sigmoid colon: Secondary | ICD-10-CM | POA: Diagnosis not present

## 2016-05-28 DIAGNOSIS — E66813 Obesity, class 3: Secondary | ICD-10-CM

## 2016-05-28 DIAGNOSIS — C50511 Malignant neoplasm of lower-outer quadrant of right female breast: Secondary | ICD-10-CM

## 2016-05-28 DIAGNOSIS — Z86718 Personal history of other venous thrombosis and embolism: Secondary | ICD-10-CM | POA: Diagnosis not present

## 2016-05-28 DIAGNOSIS — I10 Essential (primary) hypertension: Secondary | ICD-10-CM | POA: Diagnosis not present

## 2016-05-28 LAB — COMPREHENSIVE METABOLIC PANEL
ALBUMIN: 3.1 g/dL — AB (ref 3.5–5.0)
ALK PHOS: 59 U/L (ref 40–150)
ALT: 38 U/L (ref 0–55)
AST: 50 U/L — AB (ref 5–34)
Anion Gap: 8 mEq/L (ref 3–11)
BILIRUBIN TOTAL: 0.49 mg/dL (ref 0.20–1.20)
BUN: 11.6 mg/dL (ref 7.0–26.0)
CO2: 28 mEq/L (ref 22–29)
Calcium: 9.7 mg/dL (ref 8.4–10.4)
Chloride: 105 mEq/L (ref 98–109)
Creatinine: 0.9 mg/dL (ref 0.6–1.1)
EGFR: 80 mL/min/{1.73_m2} — ABNORMAL LOW (ref >90)
GLUCOSE: 85 mg/dL (ref 70–140)
Potassium: 3.7 mEq/L (ref 3.5–5.1)
SODIUM: 141 meq/L (ref 136–145)
TOTAL PROTEIN: 8 g/dL (ref 6.4–8.3)

## 2016-05-28 LAB — CBC WITH DIFFERENTIAL/PLATELET
BASO%: 0.6 % (ref 0.0–2.0)
Basophils Absolute: 0.1 10*3/uL (ref 0.0–0.1)
EOS%: 2.1 % (ref 0.0–7.0)
Eosinophils Absolute: 0.2 10*3/uL (ref 0.0–0.5)
HCT: 41.4 % (ref 34.8–46.6)
HEMOGLOBIN: 13.8 g/dL (ref 11.6–15.9)
LYMPH%: 28.1 % (ref 14.0–49.7)
MCH: 31.5 pg (ref 25.1–34.0)
MCHC: 33.5 g/dL (ref 31.5–36.0)
MCV: 94.1 fL (ref 79.5–101.0)
MONO#: 0.7 10*3/uL (ref 0.1–0.9)
MONO%: 8.4 % (ref 0.0–14.0)
NEUT%: 60.8 % (ref 38.4–76.8)
NEUTROS ABS: 5.1 10*3/uL (ref 1.5–6.5)
Platelets: 275 10*3/uL (ref 145–400)
RBC: 4.4 10*6/uL (ref 3.70–5.45)
RDW: 13.9 % (ref 11.2–14.5)
WBC: 8.5 10*3/uL (ref 3.9–10.3)
lymph#: 2.4 10*3/uL (ref 0.9–3.3)

## 2016-05-28 LAB — CEA (IN HOUSE-CHCC): CEA (CHCC-In House): 44.67 ng/mL — ABNORMAL HIGH (ref 0.00–5.00)

## 2016-05-28 MED ORDER — ACETAMINOPHEN-CODEINE #4 300-60 MG PO TABS: 1.0000 | ORAL_TABLET | Freq: Four times a day (QID) | ORAL | 0 refills | Status: DC | PRN

## 2016-05-28 NOTE — Telephone Encounter (Signed)
Called Veronica Reyes about her follow up appointment today with Dr. Sondra Come.  She said she had an appointment with Dr. Burr Medico at 3 pm and was not able to stay for her appointment at 4:30 because of transportation issues.  Transferred her to Suanne Marker, Art therapist to reschedule.

## 2016-05-28 NOTE — Telephone Encounter (Signed)
Avs report and appointment schedule given per 05/28/16 los.

## 2016-05-28 NOTE — Progress Notes (Signed)
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 Left breast DCIS diagnosed in 2006 and 2010, status post lumpectomy. 2. pT2N0M0 stage I colon cancer, s/p partial colectomy on 03/09/2014  3. PE in 12/2013 when she was diagnosed with colon cancer. 4. 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 [facility])   Staging form: Breast, AJCC 7th Edition     Clinical stage from 02/01/2015: Stage IA (T1a, N0, M0) - Signed by [name], MD on 06/04/2015     Pathologic stage from 11/10/2015: Stage IA (T1b, N0, cM0) - Signed by [name], MD on 12/08/2015 Cancer of sigmoid colon [facility])   Staging form: Colon and Rectum, AJCC 7th Edition     Clinical: Stage I (T2, N0, M0) - Unsigned          Breast cancer of lower-outer quadrant of right female breast ([place])   2006 Cancer Diagnosis    Left breast DCIS diagnosed in 2006 and 2010, status post lumpectomy.      01/07/2014 Initial Diagnosis    Breast cancer      01/18/2015 Mammogram    90m mass in lower outer quadrant of right breast       02/01/2015 Initial Biopsy    right breast mass biopsy showed invasive ductal carcinoma       02/01/2015 Receptors her2    ER 90%+, PR 10%+, kI67 10%, HER2 pending       11/10/2015 Surgery    Left breast lumpectomy and sentinel lymph node biopsy      11/10/2015 Pathology Results    Left breast lumpectomy showed invasive  ductal carcinoma, grade 2, 1 cm, low-grade DCIS, negative margins, 3 sentinel lymph nodes were negative. LVI (-)       11/10/2015 Oncotype testing    RS 22, which predicts a year risk of distant recurrence with tamoxifen alone 14%. Adjuvant chemo was not offered      02/21/2016 - 04/10/2016 Radiation Therapy    adjuvant breast radiation        [place]moid colon ([place]   03/04/2014 Initial Diagnosis    Cancer of sigmoid colon      03/09/2014 Pathology Results    G1 invasive adenocarcinoma, no lymph-Vascular invasion or Peri-neural invasion: Absent. MMR normal, MSI stable.       03/09/2014 Surgery    partial colectomy, negative margins.       11/10/2014 Relapse/Recurrence    biopsy confirmed oligo liver recurrence       11/10/2014 Imaging    PET showed two small ares of hypermetabolism in the right lobe of the liver, no other distant metastasis.       11/30/2014 Imaging    abdomen MRI showed a new enhancing lesion which corresponds to an area of abnormal hyper metabolism on recent PET-CT. Stable enlarged portal caval lymph node.       01/02/2015 Pathology Results    Liver biopsy showed metastatic adenocarcinoma, IHC (+)  for CK20, CDX-2, (-) for TTF-1 and ER      01/02/2015 Miscellaneous    liver biopsy KRAS mutation (+)       02/14/2015 Imaging    CT showed:  the small metastatic right hepatic lobe liver lesion is not identified for certain on this examination. No new lesions. Stable small cysts in segment 4 a.       03/05/2015 - 04/16/2015 Chemotherapy    CAPEOX (capecitabine 2500 mg twice daily Day 1-14, oxaliplatin 1 30 mg/m on day 1, every 21 days, S/P 3 cycles       08/24/2015 Procedure    CT-guided oligo liver metastasis microwave ablation by Dr. [name]        CURRENT TREATMENT:  Pending adjuvant anti-estrogen therapy   INTERVAL HISTORY:  Veronica Reyes 60 y.o. female with a history of recurrent left breast cancer, colon cancer s/p laparoscopic-assisted  partial colectomy (03/09/2014), PE (01/06/2014), and right breast cancer (01/2015),  presents to clinic for follow up.   She is doing well overall. Her only complain is her right flank pain which has Gotten worse since her radiation. It is positional, happens when she turns or band, intermittent. She has seen her primary care physician and has been taking Tylenol No. 4 1 tablet a day. No abdominal discomfort, nausea, change of her bowel habits, or other complaints. She otherwise feels well, has good appetite and energy level, functions well at home. She lost follow-up last month, and has not seen IR Dr. Earleen Newport since 09/2015.   MEDICAL HISTORY: Past Medical History:  Diagnosis Date  . Anxiety   . Breast CA ([place])    radiation and surgery-lt.  . Cancer ([place])    left breast cancer x2  . Colon cancer ([place])    Dr. Burr Medico seeing monthly  . Hypertension   . Liver lesion   . Pulmonary embolism ([place])    "blood clot in lungs" -dx. 4'15 with CT Chest. On Xarelto  . Radiation 01/22/10-03/12/10   left whole breast 4500 cGy, upper aspect boosted to 6120 cGy  . Radiation 02/21/16 - 04/10/16   right breast 50.4 Gy, right breast boost 12 Gy  . Shortness of breath    sob with exertion. dx. with Pulmonary emboli 4'15 ,tx. Xarelto,Lovenox used for [employer].    ALLERGIES:  is allergic to tramadol.  MEDICATIONS:    Medication List       Accurate as of 05/28/16  1:55 PM. Always use your most recent med list.          acetaminophen-codeine 300-60 MG tablet Commonly known as:  TYLENOL #4 Take 1 tablet by mouth every 6 (six) hours as needed. for pain   amLODipine 2.5 MG tablet Commonly known as:  NORVASC Take 2.5 mg by mouth daily.   ferrous sulfate 325 (65 FE) MG tablet Take 325 mg by mouth daily with breakfast.   hydrochlorothiazide 25 MG tablet Commonly known as:  HYDRODIURIL Take 1 tablet (25 mg total) by mouth every morning.   metoprolol 50 MG tablet Commonly known as:  LOPRESSOR Take 1 tablet  (50 mg total) by mouth 2 (two) times daily.   potassium chloride SA 20 MEQ tablet Commonly known as:  K-DUR,KLOR-CON Take 1 tablet (20 mEq total) by mouth daily.   XARELTO 20 MG Tabs tablet Generic drug:  rivaroxaban Take 1 tablet by mouth daily with supper       SURGICAL HISTORY:  Past Surgical History:  Procedure Laterality Date  . BREAST  LUMPECTOMY WITH RADIOACTIVE SEED AND SENTINEL LYMPH NODE BIOPSY Right 11/07/2015   Procedure: BREAST LUMPECTOMY WITH RADIOACTIVE SEED AND SENTINEL LYMPH NODE BIOPSY;  Surgeon: Stark Klein, MD;  Location: [facility];  Service: General;  Laterality: Right;  . BREAST SURGERY     '11- Left breast lumpectomy  . CESAREAN SECTION    . CHOLECYSTECTOMY     Laparoscopic 20 yrs ago  . COLON SURGERY  03-09-14   partial colectomy for cancer  . PARTIAL COLECTOMY N/A 03/09/2014   Procedure: LAPAROSCOPIC ASSISTED PARTIAL COLECTOMY, splenic flexure mobilization;  Surgeon: Leighton Ruff, MD;  Location: [facility];  Service: General;  Laterality: N/A;  . RADIOFREQUENCY ABLATION Right 08/24/2015   Procedure: MICROWAVE ABLATION RIGHT LIVER LOBE ;  Surgeon: Corrie Mckusick, DO;  Location: [facility];  Service: Anesthesiology;  Laterality: Right;    REVIEW OF SYSTEMS:   Constitutional: Denies fevers, chills or abnormal weight loss Eyes: Denies blurriness of vision Ears, nose, mouth, throat, and face: Denies mucositis or sore throat Respiratory: Denies cough, dyspnea or wheezes Cardiovascular: Denies palpitation, chest discomfort or lower extremity swelling Gastrointestinal:  Denies nausea, heartburn or change in bowel habits Skin: Denies abnormal skin rashes Lymphatics: Denies new lymphadenopathy or easy bruising Neurological:Denies numbness, tingling or new weaknesses Behavioral/Psych: Mood is stable, no new changes  All other systems were reviewed with the patient and are negative. Breasts: Breast inspection showed them to be symmetrical with no nipple  discharge. Palpation of the breasts and axilla revealed no obvious mass that I could appreciate.   Old surgical scar in the left breast. (+)  Skin erythema below both breasts   PHYSICAL EXAMINATION: ECOG PERFORMANCE STATUS: 1 Blood pressure 128/79, pulse 63, temperature 98.9 F (37.2 C), temperature source Oral, resp. rate 17, height '5\' 6"'  (1.676 m), weight 288 lb 8 oz (130.9 kg), last menstrual period 01/23/2014, SpO2 97 %. GENERAL:alert, no distress and comfortable; well developed and obese. Easily mobile to exam table.  SKIN: skin color, texture, turgor are normal, no rashes or significant lesions EYES: normal, Conjunctiva are pink and non-injected, sclera clear OROPHARYNX:no exudate, no erythema and lips, buccal mucosa, and tongue normal  NECK: supple, thyroid normal size, non-tender, without nodularity LYMPH:  no palpable lymphadenopathy in the cervical, axillary or supraclavicular LUNGS: clear to auscultation with normal breathing effort, no wheezes or rhonchi HEART: regular rate & rhythm and no murmurs and no lower extremity edema ABDOMEN:abdomen soft, non-tender and normal bowel sounds; incision healing with signs of infection.  Mild tenderness at the  Right flank area, no abdominal tenderness. No local megaly Musculoskeletal:no cyanosis of digits and no clubbing, (+) tenderness at right flank area  NEURO: alert & oriented x 3 with fluent speech, no focal motor/sensory deficits Breasts: Breast inspection showed them to be symmetrical with no nipple discharge. The surgical scar in her right breast and axilla have healed very well. Palpation of the breasts and axilla revealed no obvious mass that I could appreciate.   Labs:  CBC Latest Ref Rng & Units 05/28/2016 03/27/2016 01/31/2016  WBC 3.9 - 10.3 10e3/uL 8.5 10.1 9.1  Hemoglobin 11.6 - 15.9 g/dL 13.8 14.7 13.9  Hematocrit 34.8 - 46.6 % 41.4 44.1 40.5  Platelets 145 - 400 10e3/uL 275 255 239    CMP Latest Ref Rng & Units 05/28/2016  03/27/2016 01/31/2016  Glucose 70 - 140 mg/dl 85 100 110  BUN 7.0 - 26.0 mg/dL 11.6 9.5 8.9  Creatinine 0.6 - 1.1 mg/dL 0.9 0.9 0.9  Sodium  136 - 145 mEq/L 141 141 140  Potassium 3.5 - 5.1 mEq/L 3.7 3.8 3.5  Chloride 101 - 111 mmol/L - - -  CO2 22 - 29 mEq/L '28 28 28  ' Calcium 8.4 - 10.4 mg/dL 9.7 9.8 9.6  Total Protein 6.4 - 8.3 g/dL 8.0 8.4(H) 8.0  Total Bilirubin 0.20 - 1.20 mg/dL 0.49 0.77 0.58  Alkaline Phos 40 - 150 U/L 59 53 46  AST 5 - 34 U/L 50(H) 53(H) 40(H)  ALT 0 - 55 U/L 38 46 30   Results for Veronica Reyes, Veronica Reyes (MRN [id number]) as of 05/28/2016 15:02  Ref. Range 10/04/2015 13:33 01/31/2016 15:23  CEA Latest Units: ng/mL 1.5 2.7 (H)   PATHOLOGY RESULT Diagnosis 01/02/2015 Liver, needle/core biopsy, right - POSITIVE FOR METASTATIC ADENOCARCINOMA. - SEE COMMENT. Microscopic Comment Immunohistochemical stains are performed. The tumor is positive for cytokeratin 20 and CDX-2. Cytokeratin 7 stains background bile ducts but is negative within the tumor. The tumor is negative for TTF-1 and estrogen receptor. The morphology coupled with the staining pattern is consistent with metastatic adenocarcinoma of colorectal primary source.   Diagnosis 11/07/2015 1. Breast, lumpectomy, Right - INVASIVE DUCTAL CARCINOMA, GRADE 2, SPANNING 1 CM. - DUCTAL CARCINOMA IN SITU, LOW GRADE. - RESECTION MARGINS ARE NEGATIVE FOR INVASIVE CARCINOMA. - DUCTAL CARCINOMA IN SITU COMES TO WITHIN 0.3 CM OF THE POSTERIOR MARGIN. - BIOPSY SITE. - SEE ONCOLOGY TABLE. 2. Lymph node, sentinel, biopsy, right axillary #1 - ONE OF ONE LYMPH NODES NEGATIVE FOR CARCINOMA (0/1). 3. Lymph node, sentinel, biopsy, right axillary #2 - ONE OF ONE LYMPH NODES NEGATIVE FOR CARCINOMA (0/1). 4. Lymph node, sentinel, biopsy, right axillary #3 - ONE OF ONE LYMPH NODES NEGATIVE FOR CARCINOMA (0/1). Microscopic Comment 1. BREAST, INVASIVE TUMOR, WITH LYMPH NODES PRESENT Specimen, including laterality and lymph node  sampling (sentinel, non-sentinel): Right breast lump and right axillary sentinel lymph nodes. Procedure: Right breast lumpectomy and right axillary sentinel lymph node biopsy. Histologic type: Invasive ductal carcinoma. Grade: 2. Tubule formation: 3 Nuclear pleomorphism: 2 Mitotic:1 Tumor size (gross measurement): 1 cm Margins: Invasive, distance to closest margin: 0.2 cm (posterior). In-situ, distance to closest margin: 0.3 cm (posterior, focal). If margin positive, focally or broadly: N/A. Lymphovascular invasion: Not identified. Ductal carcinoma in situ: Present. Grade: I Extensive intraductal component: No. Lobular neoplasia: Not identified. Tumor focality: Unifocal. Treatment effect: N/A. Extent of tumor: Confined to breast parenchyma. Lymph nodes: Examined: 3 Sentinel 0 Non-sentinel 3 Total Lymph nodes with metastasis: 0 Isolated tumor cells (< 0.2 mm): 0 Micrometastasis: (> 0.2 mm and < 2.0 mm): 0 Macrometastasis: (> 2.0 mm): 0 Extracapsular extension: N/A. Breast prognostic profile: Performed on biopsy ([id number]), see below. Her2 will be repeated on the current specimen. Estrogen receptor: Positive, 90%, strong staining intensity. Progesterone receptor: Positive, 10%, strong staining intensity. Her 2 neu: Negative. Ki-67: 10%. Non-neoplastic breast: Fibrocystic change and usual ductal hyperplasia. Biopsy site. TNM: pT1b, pN0 Comments: None  ONCOTYPE DX: RS 22, which predicts a year risk of distant recurrence with tamoxifen alone 14%  Results: IMMUNOHISTOCHEMICAL AND MORPHOMETRIC ANALYSIS BY THE AUTOMATED CELLULAR IMAGING SYSTEM (ACIS) Estrogen Receptor: 90%, POSITIVE, STRONG STAINING INTENSITY (PERFORMED MANUALLY) Progesterone Receptor: 10%, POSITIVE, STRONG STAINING INTENSITY (PERFORMED MANUALLY) Proliferation Marker Ki67: 10% 1. FLUORESCENCE IN-SITU HYBRIDIZATION Results: HER2 - NEGATIVE RATIO OF HER2/CEP17 SIGNALS 1.29 AVERAGE HER2 COPY NUMBER PER CELL  1.80  RADIOLOGY STUDIES   Abdomen MRI w wo contrast 05/29/2015  IMPRESSION: 1. Mild degradation secondary to patient body habitus. 2. Isolated segment  6 subcapsular liver lesion is similar. No new or progressive disease identified. 3. Interpolar right renal lesion is favored to represent a complex/septated cyst and is not significantly changed in size.  CT guided tissue ablation by Dr. Earleen Newport 08/24/2015 IMPRESSION: Status post image guided microwave ablation of solitary metastasis in the right liver lobe in this patient with oligo metastatic colorectal cancer.   CT CAP with contrast 12/18/2015 IMPRESSION: Expected appearance of ablation defect at site of previously demonstrated posterior right hepatic lobe metastasis. No definite evidence of residual or progressive metastatic disease.  Stable 3 mm left lower lobe pulmonary nodule. Continued attention recommended on follow-up CT.  Stable enlarged myomatous uterus.   ASSESSMENT: Veronica Reyes 60 y.o. female with a history of  1. New right breast cancer, pT1bN0M0, stage Ia, ER+/PR+/HER2-, History of left Breast Cancer, diagnosed on 02/01/2015 --Left CIS in situ, s/p lumpectomy in 2006 and 2011 by Dr. Margot Chimes, and radiation in 2011 by Dr. Sondra Come.  -I reviewed her surgical pathology results from 11/10/2015, which showed a 1 cm grade 2 invasive ductal carcinoma and DCIS. Surgical margins were negative. -I reviewed her Oncotype DX result. Her recurrence score is 22, which predicts 14% 10 year risk of distant recurrence with tamoxifen alone. This is intermediate risk, based on her relatively low number, I do not recommend adjuvant chemotherapy. -She has completed adjuvant breast radiation -She has not had menstrual period for 2 years until one months ago she had one episode of vaginal bleeding. I'll check her [place] and estradiol level to see if she is truly postmenopausal. -Giving the strong ER/PR positivity, and previous multiple  breast cancer, I strongly recommend her to take aromatase inhibitor (if she is postmenopausal) as adjuvant endocrine therapy. The potential benefit and side effects, which includes but not limited to, hot flash, skin and vaginal dryness, metabolic changes ( increased blood glucose, cholesterol, weight, etc.), slightly in increased risk of cardiovascular disease, cataracts, muscular and joint discomfort, osteopenia and osteoporosis, etc, were discussed with her in great details. She is interested and agreed to start. -Due to her prior history of PE, she may not be a good candidate for tamoxifen. If she is pre-or para-menopause, I recommend overexpression plus aromatase inhibitor.  -We also discussed breast cancer surveillance, I strongly encouraged her to continue annual screening mammogram, self exam, and follow-up with [place] routinely.  2.Colon Cancer, Stage I (pT2N0), MMR normal, oligo recurrent disease in liver in 12/2014, KRAS mutation (+) -I previously reviewed the nature history of metastatic colon cancer, and the potential curative approach with chemotherapy followed by liver lesion ablation or resection. However, more likely, this is not curable disease. -She is status post 3 cycles of Capeox, does not want to proceed with fourth cycle due to the moderate side effects from previous chemotherapy treatment. She declined liver mass resection, and underwent liver ablation by interventional radiologist Dr. Earleen Newport in 08/2015. - I reviewed her restaging CT scan from April 2017, which showed no evidence of recurrence. The previously ablated liver lesion appears stable, no suspicion for residual disease or recurrence. -She is overdue for restaging CT scan due to her noncompliance. She has had worsening right flank pain for a few months now, I'll obtain a restaging CT chest, abdomen and pelvis with contrast in the next few weeks to ruled out metastasis. -Continue monitoring CEA every 2-3 months -I encouraged her  to follow-up with Dr. Earleen Newport  3. History of PE (01/06/2014) --She has a diagnosis of pulmonary embolism, likely secondary to  malignancy in the setting of family history for stroke (and possible stroke history in patient as well). Lower extremity dopplers negative.  -continue Xarelto. Patient asked about if she can come off Xarelto. If she has no evidence of cancer in 6 months, I can potentially take her off Xarelto. She understands she has moderate to high risk of recurrent thrombosis if she is off anticoagulation.  4.  Uterine Fibroids  --Negative endometrial biopsy. Following with Gynecology.  5. Kidney lesion (Right). -No hypermetabolic right kidney lesion on the pet scan -Repeat abdominal MRI 05/29/2015 showed unchanged right renal lesion, favored to represent a complex/septated cyst.  6. Right flank pain - possible muscular pain , she does have tender point.  -her CT scan in 12/2015 reviewed no evidence of cancer recurrence, her renal cysts are stable.  7.  Morbid obesity - I encouraged her to eat healthy and exercise more. - I encouraged her to participate the " living well"  Program in our cancer center,  Which is designed for breast cancer survivor to improve your diet and exercise.  Plan  -Restaging CT chest, abdomen and pelvis with contrast within the next 2 weeks, she will call me after her scan to discuss the results -I'll see her back in 5-6 weeks with lab, sooner if her CT is abnormal   All questions were answered. The patient knows to call the clinic with any problems, questions or concerns. We can certainly see the patient much sooner if necessary.  I spent 25 minutes counseling the patient face to face. The total time spent in the appointment was 30 minutes.   [name]  05/28/2016

## 2016-05-29 LAB — CEA: CEA1: 48 ng/mL — AB (ref 0.0–4.7)

## 2016-05-29 LAB — ESTRADIOL: ESTRADIOL: 13.5 pg/mL

## 2016-05-29 LAB — FOLLICLE STIMULATING HORMONE: FSH: 53 m[IU]/mL

## 2016-06-02 ENCOUNTER — Other Ambulatory Visit: Payer: Self-pay | Admitting: Hematology

## 2016-06-02 MED ORDER — LETROZOLE 2.5 MG PO TABS: 2.5000 mg | ORAL_TABLET | Freq: Every day | ORAL | 2 refills | Status: DC

## 2016-06-05 ENCOUNTER — Telehealth: Payer: Self-pay | Admitting: *Deleted

## 2016-06-05 NOTE — Telephone Encounter (Signed)
Patient called and left message that she got our message.

## 2016-06-05 NOTE — Telephone Encounter (Signed)
Tried for 3 days to reach patient on home and cell - no answer and no availability to leave message.   Called husband on cell and spoke with him.  He will give patient the message - Let her know that lab work has come back and Dr. Burr Medico has called in letrozole for her and she is to begin taking it as soon as she gets it filled.

## 2016-06-11 ENCOUNTER — Telehealth: Payer: Self-pay | Admitting: *Deleted

## 2016-06-11 ENCOUNTER — Telehealth: Payer: Self-pay | Admitting: Oncology

## 2016-06-11 ENCOUNTER — Telehealth: Payer: Self-pay | Admitting: Hematology

## 2016-06-11 ENCOUNTER — Ambulatory Visit
Admission: RE | Admit: 2016-06-11 | Discharge: 2016-06-11 | Disposition: A | Discharge status: Home / Self Care | Payer: BLUE CROSS/BLUE SHIELD | Source: Ambulatory Visit | Attending: Radiation Oncology | Admitting: Radiation Oncology

## 2016-06-11 NOTE — Telephone Encounter (Signed)
"  I need the contrast liquid for my scans.  What do I need to do to get this?"  Advised she come to [facility] and a scheduler can provide this.

## 2016-06-11 NOTE — Telephone Encounter (Signed)
Patient came by to pick up contrast on  06/11/16. 2 bottles given to patient. 06/11/16

## 2016-06-11 NOTE — Telephone Encounter (Signed)
Called Veronica Reyes regarding her follow up appointment with Dr. Sondra Come today.  Veronica Reyes said she forgot about the appointment and would like to reschedule.  Transferred her to Suanne Marker, Art therapist to reschedule.

## 2016-06-17 ENCOUNTER — Encounter (HOSPITAL_COMMUNITY): Payer: Self-pay

## 2016-06-17 ENCOUNTER — Ambulatory Visit (HOSPITAL_COMMUNITY)
Admission: RE | Admit: 2016-06-17 | Discharge: 2016-06-17 | Disposition: A | Discharge status: Home / Self Care | Payer: BLUE CROSS/BLUE SHIELD | Source: Ambulatory Visit | Attending: Hematology | Admitting: Hematology

## 2016-06-17 DIAGNOSIS — R918 Other nonspecific abnormal finding of lung field: Secondary | ICD-10-CM | POA: Insufficient documentation

## 2016-06-17 DIAGNOSIS — R109 Unspecified abdominal pain: Secondary | ICD-10-CM | POA: Diagnosis not present

## 2016-06-17 DIAGNOSIS — C187 Malignant neoplasm of sigmoid colon: Secondary | ICD-10-CM | POA: Diagnosis present

## 2016-06-17 MED ORDER — IOPAMIDOL (ISOVUE-300) INJECTION 61%
100.0000 mL | Freq: Once | INTRAVENOUS | Status: AC | PRN
  Administered 2016-06-17: 100 mL via INTRAVENOUS

## 2016-06-25 ENCOUNTER — Ambulatory Visit (HOSPITAL_BASED_OUTPATIENT_CLINIC_OR_DEPARTMENT_OTHER): Payer: BLUE CROSS/BLUE SHIELD | Admitting: Adult Health

## 2016-06-25 ENCOUNTER — Encounter: Payer: Self-pay | Admitting: Adult Health

## 2016-06-25 VITALS — BP 136/68 | HR 63 | Temp 97.6°F | Resp 18 | Ht 66.0 in | Wt 285.1 lb

## 2016-06-25 DIAGNOSIS — C50511 Malignant neoplasm of lower-outer quadrant of right female breast: Secondary | ICD-10-CM

## 2016-06-25 DIAGNOSIS — Z170 Estrogen receptor positive status [ER+]: Secondary | ICD-10-CM

## 2016-06-25 DIAGNOSIS — Z79811 Long term (current) use of aromatase inhibitors: Secondary | ICD-10-CM | POA: Diagnosis not present

## 2016-06-25 DIAGNOSIS — Z780 Asymptomatic menopausal state: Secondary | ICD-10-CM

## 2016-06-25 DIAGNOSIS — C787 Secondary malignant neoplasm of liver and intrahepatic bile duct: Secondary | ICD-10-CM | POA: Diagnosis not present

## 2016-06-25 NOTE — Addendum Note (Signed)
Addended by: Holley Bouche on: 06/25/2016 05:01 PM   Modules accepted: Orders

## 2016-06-25 NOTE — Progress Notes (Signed)
CLINIC:  Survivorship   REASON FOR VISIT:  Routine follow-up post-treatment for a recent history of breast cancer.  BRIEF ONCOLOGIC HISTORY:  Oncology History   Breast cancer of lower-outer quadrant of right female breast [facility])   Staging form: Breast, AJCC 7th Edition     Clinical stage from 02/01/2015: Stage IA (T1a, N0, M0) - Signed by [name], MD on 06/04/2015     Pathologic stage from 11/10/2015: Stage IA (T1b, N0, M0) - Signed by [name], MD on 12/08/2015 Cancer of sigmoid colon [facility])   Staging form: Colon and Rectum, AJCC 7th Edition     Clinical: Stage I (T2, N0, M0) - Unsigned     Breast cancer of lower-outer quadrant of right female breast ([place])   2006 Cancer Diagnosis    Left breast DCIS diagnosed in 2006 and 2010, status post lumpectomy.      01/07/2014 Initial Diagnosis    Breast cancer      01/18/2015 Mammogram    55m mass in lower outer quadrant of right breast       02/01/2015 Initial Biopsy    (R) breast mass biopsy showed invasive ductal carcinoma & DCIS, grade 1-2.       02/01/2015 Receptors her2    ER 90%+, PR 10%+, Ki67 10%, HER2 negative (ratio 1.09)       11/07/2015 Surgery    (R) breast lumpectomy and sentinel lymph node biopsy ([name]      11/07/2015 Pathology Results    (R) breast lumpectomy showed invasive ductal carcinoma, grade 2, 1 cm, low-grade DCIS, negative margins, 3 sentinel lymph nodes were negative. LVI (-). HER2 repeated and remains negative (ratio 1.29).        11/07/2015 Pathologic Stage    pT1b,pN0: Stage IA       11/07/2015 Oncotype testing    RS 22, which predicts a year risk of distant recurrence with tamoxifen alone 14% (intermediate-risk). Adjuvant chemo was not offered      02/21/2016 - 04/10/2016 Radiation Therapy    Adjuvant breast radiation. ([name]). Right breast: 50.4 Gy in 28 fractions.  Right breast boost: 12 Gy in 6 fractions.      05/2016 -  Anti-estrogen oral therapy    Femara 2.5 mg daily.        Cancer of  sigmoid colon ([place]   03/04/2014 Initial Diagnosis    Cancer of sigmoid colon      03/09/2014 Pathology Results    G1 invasive adenocarcinoma, no lymph-Vascular invasion or Peri-neural invasion: Absent. MMR normal, MSI stable.       03/09/2014 Surgery    partial colectomy, negative margins.       11/10/2014 Relapse/Recurrence    biopsy confirmed oligo liver recurrence       11/10/2014 Imaging    PET showed two small ares of hypermetabolism in the right lobe of the liver, no other distant metastasis.       11/30/2014 Imaging    abdomen MRI showed a new enhancing lesion which corresponds to an area of abnormal hyper metabolism on recent PET-CT. Stable enlarged portal caval lymph node.       01/02/2015 Pathology Results    Liver biopsy showed metastatic adenocarcinoma, IHC (+) for CK20, CDX-2, (-) for TTF-1 and ER      01/02/2015 Miscellaneous    liver biopsy KRAS mutation (+)       02/14/2015 Imaging    CT showed:  the small metastatic right hepatic lobe liver lesion is not identified  for certain on this examination. No new lesions. Stable small cysts in segment 4 a.       03/05/2015 - 04/16/2015 Chemotherapy    CAPEOX (capecitabine 2500 mg twice daily Day 1-14, oxaliplatin 1 30 mg/m on day 1, every 21 days, S/P 3 cycles       08/24/2015 Procedure    CT-guided oligo liver metastasis microwave ablation by Dr. [name]       INTERVAL HISTORY:  Veronica Reyes presents to the [facility] today for our initial meeting to review her survivorship care plan detailing her treatment course for breast cancer, as well as monitoring long-term side effects of that treatment, education regarding health maintenance, screening, and overall wellness and health promotion.     Overall, Veronica Reyes reports feeling quite well since completing her breast radiation therapy approximately 2.5 months ago.  She Started the Femara in 05/2016, and is tolerating it well thus far. She endorses vaginal dryness.  Denies hot flashes or arthralgias.  She did share with me that unfortunately her brother, who lives in [place], passed away this morning. Her sister is here with her today. We offered to reschedule her survivorship visit as a result of this news, but she elected to be seen today. She tells me she is coping well, both in terms of her breasts/colon cancers and in terms of losing her brother. She has great support in her family and friends.   REVIEW OF SYSTEMS:  Review of Systems  Constitutional: Positive for malaise/fatigue.       She has some fatigue, but it is improving since completing radiation therapy.  HENT: Negative.   Eyes: Negative.   Respiratory: Negative.   Cardiovascular: Negative.   Gastrointestinal: Negative.   Genitourinary:       Vaginal dryness; Dyspareunia  Musculoskeletal: Negative.  Negative for joint pain.  Skin: Negative.   Neurological: Negative.   Endo/Heme/Allergies: Negative.   Psychiatric/Behavioral: Negative.   GU: Denies vaginal bleeding or discharge.  Breast: Denies any new nodularity, masses, tenderness, nipple changes, or nipple discharge.    A 14-point review of systems was completed and was negative, except as noted above.   ONCOLOGY TREATMENT TEAM:  1. Surgeon:  Dr. [name] at [facility] 2. Medical Oncologist: Dr. Burr Medico 3. Radiation Oncologist: Dr. Sondra Come    PAST MEDICAL/SURGICAL HISTORY:  Past Medical History:  Diagnosis Date  . Anxiety   . Breast CA ([place])    radiation and surgery-lt.  . Cancer ([place])    left breast cancer x2  . Colon cancer ([place])    Dr. Burr Medico seeing monthly  . Hypertension   . Liver lesion   . Pulmonary embolism ([place])    "blood clot in lungs" -dx. 4'15 with CT Chest. On Xarelto  . Radiation 01/22/10-03/12/10   left whole breast 4500 cGy, upper aspect boosted to 6120 cGy  . Radiation 02/21/16 - 04/10/16   right breast 50.4 Gy, right breast boost 12 Gy  . Shortness of breath    sob with exertion. dx. with  Pulmonary emboli 4'15 ,tx. Xarelto,Lovenox used for [employer].   Past Surgical History:  Procedure Laterality Date  . BREAST LUMPECTOMY WITH RADIOACTIVE SEED AND SENTINEL LYMPH NODE BIOPSY Right 11/07/2015   Procedure: BREAST LUMPECTOMY WITH RADIOACTIVE SEED AND SENTINEL LYMPH NODE BIOPSY;  Surgeon: Stark Klein, MD;  Location: [facility];  Service: General;  Laterality: Right;  . BREAST SURGERY     '11- Left breast lumpectomy  . CESAREAN SECTION    .  CHOLECYSTECTOMY     Laparoscopic 20 yrs ago  . COLON SURGERY  03-09-14   partial colectomy for cancer  . PARTIAL COLECTOMY N/A 03/09/2014   Procedure: LAPAROSCOPIC ASSISTED PARTIAL COLECTOMY, splenic flexure mobilization;  Surgeon: Leighton Ruff, MD;  Location: [facility];  Service: General;  Laterality: N/A;  . RADIOFREQUENCY ABLATION Right 08/24/2015   Procedure: MICROWAVE ABLATION RIGHT LIVER LOBE ;  Surgeon: Corrie Mckusick, DO;  Location: [facility];  Service: Anesthesiology;  Laterality: Right;     ALLERGIES:  Allergies  Allergen Reactions  . Tramadol Other (See Comments)    Dizziness     CURRENT MEDICATIONS:  Outpatient Encounter Prescriptions as of 06/25/2016  Medication Sig  . acetaminophen-codeine (TYLENOL #4) 300-60 MG tablet Take 1 tablet by mouth every 6 (six) hours as needed. for pain  . amLODipine (NORVASC) 2.5 MG tablet Take 2.5 mg by mouth daily.  . ferrous sulfate 325 (65 FE) MG tablet Take 325 mg by mouth daily with breakfast.   . hydrochlorothiazide (HYDRODIURIL) 25 MG tablet Take 1 tablet (25 mg total) by mouth every morning.  Marland Kitchen letrozole (FEMARA) 2.5 MG tablet Take 1 tablet (2.5 mg total) by mouth daily.  . metoprolol (LOPRESSOR) 50 MG tablet Take 1 tablet (50 mg total) by mouth 2 (two) times daily.  . potassium chloride SA (K-DUR,KLOR-CON) 20 MEQ tablet Take 1 tablet (20 mEq total) by mouth daily.  Alveda Reasons 20 MG TABS tablet Take 1 tablet by mouth daily with supper   No facility-administered encounter  medications on file as of 06/25/2016.      ONCOLOGIC FAMILY HISTORY:  Family History  Problem Relation Age of Onset  . Hypertension Sister   . Cancer Cousin      GENETIC COUNSELING/TESTING: None.  SOCIAL HISTORY:  BRADY PLANT is married and lives with her husband in [place], [place]. She does not use tobacco or drink alcohol. She does report smoking marijuana daily.   PHYSICAL EXAMINATION:  Vital Signs:   Vitals:   06/25/16 0958  BP: 136/68  Pulse: 63  Resp: 18  Temp: 97.6 F (36.4 C)   Filed Weights   06/25/16 0958  Weight: 285 lb 1.6 oz (129.3 kg)   General: Well-nourished, well-appearing female in no acute distress.  She is accompanied by her sister today.   HEENT: Head is normocephalic.  Pupils equal and reactive to light. Conjunctivae clear without exudate.  Sclerae anicteric. Oral mucosa is pink, moist.  Oropharynx is pink without lesions or erythema.  Lymph: No cervical, supraclavicular, or infraclavicular lymphadenopathy noted on palpation.  Cardiovascular: Regular rate and rhythm.Marland Kitchen Respiratory: Clear to auscultation bilaterally. Chest expansion symmetric; breathing non-labored.  GI: Abdomen soft and round; non-tender, non-distended. Bowel sounds normoactive.  GU: Deferred.  Neuro: No focal deficits. Steady gait.  Psych: Mood and affect normal and appropriate for situation.  Extremities: No edema. Skin: Warm and dry.  LABORATORY DATA:  None for this visit.  DIAGNOSTIC IMAGING:  None for this visit.      ASSESSMENT AND PLAN:  Ms.. Sabra Reyes is a pleasant 60 y.o. female with Stage IA right breast invasive ductal carcinoma, ER+/PR+/HER2-, diagnosed in 01/2015 (treatment initially held d/t colon cancer diagnosis and subsequent treatment).  Her breast cancer was treated with lumpectomy, adjuvant radiation therapy, and anti-estrogen therapy with Femara beginning in 05/2016.  She presents to the Survivorship Clinic for our initial meeting and routine follow-up  post-completion of treatment for breast cancer.    1. Stage IA right breast cancer:  Ms.  Sabra Reyes is continuing to recover from definitive treatment for breast cancer. She did have a mammogram done in January 2017, therefore she will be due for annual mammography in 09/2016; orders placed today. She will follow-up with her medical oncologist, Dr. Burr Medico in 06/2016 with history and physical exam per surveillance protocol.  She will continue her anti-estrogen therapy with Femara. Thus far, she is tolerating the medication well, with minimal side effects, aside from vaginal dryness (see #2 below).  We discussed that it may take up to 3 months for [place] to evaluate how well she will continue to tolerate the Femara. She was encouraged to call either myself or Dr. Burr Medico to let us know if she has worsening side effects of the Femara. Common side effects of Femara were again reviewed with her as well. Today, a comprehensive survivorship care plan and treatment summary was reviewed with the patient today detailing her breast cancer diagnosis, treatment course, potential late/long-term effects of treatment, appropriate follow-up care with recommendations for the future, and patient education resources.  A copy of this summary, along with a letter will be sent to the patient's primary care provider via mail/fax/In Basket message after today's visit.  Her care team was updated to include Dr. Alphonzo Grieve as her PCP.  2. Colon cancer:  Continue follow-up with Dr. Burr Medico and GI specialists as directed.  3. Anticipated grief: With the sudden passing of her brother, Veronica Reyes will likely experience increased over the next several weeks to months. I offered my condolences for her loss today. I gave her information on our spiritual care department and how to contact our chaplain, if she needs additional support. She voices appreciation.   4. Vaginal dryness/Dyspareunia: Mrs. Sabra Reyes is likely experiencing vaginal dryness as a result of  both menopause an aromatase inhibitor use. We discussed the role of antiestrogen plays in vaginal health. We also discussed different interventions to manage vaginal dryness and dyspareunia. I encouraged her to try coconut oil, which can be used as both a vaginal moisturizer as well as a lubricant for intercourse. I gave her instructions on how to use the coconut oil, and encouraged her to call me with any questions or concerns. We also discussed the "Healthy Pelvic Floor Class", which may be helpful in giving her additional she tried allergies to help manage the vaginal dryness & dyspareunia. She was given a handout about this class with instructions on how to register, if she chooses to participate.  5. Bone health:  Given Ms. Miller's age, history of breast cancer, and her current treatment regimen including anti-estrogen therapy with Femara, she is at risk for bone demineralization.  We do not have any records of any recent bone mineral density testing. I explained the importance of baseline DEXA imaging for patients who start on aromatase inhibitors. She agreed to have her baseline DEXA scan done at the time of her mammogram in 09/2016. We will work to try to coordinate these 2 imaging studies for her.  In the meantime, she was encouraged to increase her consumption of foods rich in calcium, as well as increase her weight-bearing activities.  She was given education on specific activities to promote bone health.  6. Cancer screening:  Due to Ms. Miller's history and her age, she should receive screening for skin cancers, colon cancer, and gynecologic cancers.  The information and recommendations are listed on the patient's comprehensive care plan/treatment summary and were reviewed in detail with the patient.    7. Health maintenance and  wellness promotion: Veronica Reyes was encouraged to consume 5-7 servings of fruits and vegetables per day. We reviewed the "Nutrition Rainbow" handout. She was also  encouraged to engage in moderate to vigorous exercise for 30 minutes per day most days of the week. We discussed the [employer] fitness program, which is designed for cancer survivors to help them become more physically fit after cancer treatments.  She was instructed to limit her alcohol consumption and continue to abstain from tobacco use.   8. Support services/counseling: It is not uncommon for this period of the patient's cancer care trajectory to be one of many emotions and stressors.  We discussed an opportunity for her to participate in the next session of [facility] ("Finding Your New Normal") support group series designed for patients after they have completed treatment.   Veronica Reyes was encouraged to take advantage of our many other support services programs, support groups, and/or counseling in coping with her new life as a cancer survivor after completing anti-cancer treatment.  She was offered support today through active listening and expressive supportive counseling.  She was given information regarding our available services and encouraged to contact me with any questions or for help enrolling in any of our support group/programs.    Dispo:   -Return to cancer center to see Dr. Burr Medico on 07/10/16. -Annual mammogram due in 09/2016; orders placed today. -Baseline DEXA scan due as well; orders placed to coordinate with 09/2016 mammogram. -She is welcome to return back to the Survivorship Clinic at any time; no additional follow-up needed at this time.  -Consider referral back to survivorship as a long-term survivor for continued surveillance  A total of 45 minutes of face-to-face time was spent with this patient with greater than 50% of that time in counseling and care-coordination.   Mike Craze, NP Survivorship Program [facility] [phone]   Note: PRIMARY CARE PROVIDER [name], [place] [phone]

## 2016-06-27 ENCOUNTER — Other Ambulatory Visit (HOSPITAL_BASED_OUTPATIENT_CLINIC_OR_DEPARTMENT_OTHER): Payer: BLUE CROSS/BLUE SHIELD

## 2016-06-27 ENCOUNTER — Ambulatory Visit (HOSPITAL_BASED_OUTPATIENT_CLINIC_OR_DEPARTMENT_OTHER): Payer: BLUE CROSS/BLUE SHIELD | Admitting: Hematology

## 2016-06-27 ENCOUNTER — Telehealth: Payer: Self-pay | Admitting: Hematology

## 2016-06-27 VITALS — BP 158/88 | HR 66 | Temp 99.0°F | Resp 17 | Ht 66.0 in | Wt 285.6 lb

## 2016-06-27 DIAGNOSIS — Z86718 Personal history of other venous thrombosis and embolism: Secondary | ICD-10-CM

## 2016-06-27 DIAGNOSIS — C187 Malignant neoplasm of sigmoid colon: Secondary | ICD-10-CM | POA: Diagnosis not present

## 2016-06-27 DIAGNOSIS — I10 Essential (primary) hypertension: Secondary | ICD-10-CM

## 2016-06-27 DIAGNOSIS — Z170 Estrogen receptor positive status [ER+]: Secondary | ICD-10-CM

## 2016-06-27 DIAGNOSIS — C787 Secondary malignant neoplasm of liver and intrahepatic bile duct: Secondary | ICD-10-CM

## 2016-06-27 DIAGNOSIS — E66813 Obesity, class 3: Secondary | ICD-10-CM

## 2016-06-27 DIAGNOSIS — C50511 Malignant neoplasm of lower-outer quadrant of right female breast: Secondary | ICD-10-CM | POA: Diagnosis not present

## 2016-06-27 LAB — COMPREHENSIVE METABOLIC PANEL
ALT: 31 U/L (ref 0–55)
ANION GAP: 8 meq/L (ref 3–11)
AST: 43 U/L — AB (ref 5–34)
Albumin: 3.1 g/dL — ABNORMAL LOW (ref 3.5–5.0)
Alkaline Phosphatase: 64 U/L (ref 40–150)
BUN: 15 mg/dL (ref 7.0–26.0)
CHLORIDE: 103 meq/L (ref 98–109)
CO2: 27 meq/L (ref 22–29)
CREATININE: 0.9 mg/dL (ref 0.6–1.1)
Calcium: 9.6 mg/dL (ref 8.4–10.4)
EGFR: 78 mL/min/{1.73_m2} — ABNORMAL LOW (ref >90)
GLUCOSE: 98 mg/dL (ref 70–140)
Potassium: 3.8 mEq/L (ref 3.5–5.1)
SODIUM: 138 meq/L (ref 136–145)
Total Bilirubin: 0.42 mg/dL (ref 0.20–1.20)
Total Protein: 8.4 g/dL — ABNORMAL HIGH (ref 6.4–8.3)

## 2016-06-27 LAB — CBC WITH DIFFERENTIAL/PLATELET
BASO%: 0.2 % (ref 0.0–2.0)
Basophils Absolute: 0 10*3/uL (ref 0.0–0.1)
EOS%: 1.8 % (ref 0.0–7.0)
Eosinophils Absolute: 0.2 10*3/uL (ref 0.0–0.5)
HCT: 40.6 % (ref 34.8–46.6)
HGB: 13.6 g/dL (ref 11.6–15.9)
LYMPH%: 31.2 % (ref 14.0–49.7)
MCH: 31.6 pg (ref 25.1–34.0)
MCHC: 33.5 g/dL (ref 31.5–36.0)
MCV: 94.2 fL (ref 79.5–101.0)
MONO#: 0.7 10*3/uL (ref 0.1–0.9)
MONO%: 6.9 % (ref 0.0–14.0)
NEUT#: 6.1 10*3/uL (ref 1.5–6.5)
NEUT%: 59.9 % (ref 38.4–76.8)
PLATELETS: 263 10*3/uL (ref 145–400)
RBC: 4.31 10*6/uL (ref 3.70–5.45)
RDW: 13.9 % (ref 11.2–14.5)
WBC: 10.2 10*3/uL (ref 3.9–10.3)
lymph#: 3.2 10*3/uL (ref 0.9–3.3)

## 2016-06-27 MED ORDER — ACETAMINOPHEN-CODEINE #4 300-60 MG PO TABS: 1.0000 | ORAL_TABLET | Freq: Four times a day (QID) | ORAL | 0 refills | Status: DC | PRN

## 2016-06-27 NOTE — Progress Notes (Signed)
[facility] PROGRESS NOTE DATE OF VISIT: 06/28/2016   Veronica Docker, MD 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 Left breast DCIS diagnosed in 2006 and 2010, status post lumpectomy. 2. pT2N0M0 stage I colon cancer, s/p partial colectomy on 03/09/2014  3. PE in 12/2013 when she was diagnosed with colon cancer. 4. 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 [facility])   Staging form: Breast, AJCC 7th Edition     Clinical stage from 02/01/2015: Stage IA (T1a, N0, M0) - Signed by [name], MD on 06/04/2015     Pathologic stage from 11/10/2015: Stage IA (T1b, N0, M0) - Signed by [name], MD on 12/08/2015 Cancer of sigmoid colon [facility])   Staging form: Colon and Rectum, AJCC 7th Edition     Clinical: Stage I (T2, N0, M0) - Unsigned     Breast cancer of lower-outer quadrant of right female breast ([place])   2006 Cancer Diagnosis    Left breast DCIS diagnosed in 2006 and 2010, status post lumpectomy.      01/07/2014 Initial Diagnosis    Breast cancer      01/18/2015 Mammogram    34m mass in lower outer quadrant of right breast       02/01/2015 Initial Biopsy    (R) breast mass biopsy showed invasive ductal carcinoma & DCIS, grade 1-2.       02/01/2015 Receptors her2    ER 90%+, PR 10%+, Ki67 10%, HER2 negative (ratio 1.09)       11/07/2015 Surgery    (R) breast lumpectomy and sentinel lymph node biopsy ([name]      11/07/2015 Pathology Results    (R) breast lumpectomy showed invasive ductal  carcinoma, grade 2, 1 cm, low-grade DCIS, negative margins, 3 sentinel lymph nodes were negative. LVI (-). HER2 repeated and remains negative (ratio 1.29).        11/07/2015 Pathologic Stage    pT1b,pN0: Stage IA       11/07/2015 Oncotype testing    RS 22, which predicts a year risk of distant recurrence with tamoxifen alone 14% (intermediate-risk). Adjuvant chemo was not offered      02/21/2016 - 04/10/2016 Radiation Therapy    Adjuvant breast radiation. ([name]). Right breast: 50.4 Gy in 28 fractions.  Right breast boost: 12 Gy in 6 fractions.      05/2016 -  Anti-estrogen oral therapy    Femara 2.5 mg daily.        Cancer of sigmoid colon ([place]   03/04/2014 Initial Diagnosis    Cancer of sigmoid colon      03/09/2014 Pathology Results    G1 invasive adenocarcinoma, no lymph-Vascular invasion or Peri-neural invasion: Absent. MMR normal, MSI stable.       03/09/2014 Surgery    partial colectomy, negative margins.       11/10/2014 Relapse/Recurrence    biopsy confirmed oligo liver recurrence       11/10/2014 Imaging    PET showed two small ares of hypermetabolism in the right lobe of the liver, no  other distant metastasis.       11/30/2014 Imaging    abdomen MRI showed a new enhancing lesion which corresponds to an area of abnormal hyper metabolism on recent PET-CT. Stable enlarged portal caval lymph node.       01/02/2015 Pathology Results    Liver biopsy showed metastatic adenocarcinoma, IHC (+) for CK20, CDX-2, (-) for TTF-1 and ER      01/02/2015 Miscellaneous    liver biopsy KRAS mutation (+)       02/14/2015 Imaging    CT showed:  the small metastatic right hepatic lobe liver lesion is not identified for certain on this examination. No new lesions. Stable small cysts in segment 4 a.       03/05/2015 - 04/16/2015 Chemotherapy    CAPEOX (capecitabine 2500 mg twice daily Day 1-14, oxaliplatin 1 30 mg/m on day 1, every 21 days, S/P 3 cycles       08/24/2015 Procedure     CT-guided oligo liver metastasis microwave ablation by Dr. [name]        CURRENT TREATMENT:  Letrozole 2.5 mg once daily, started in September 2017  INTERVAL HISTORY:  Veronica Reyes 60 y.o. female with a history of recurrent left breast cancer, colon cancer s/p laparoscopic-assisted partial colectomy (03/09/2014), PE (01/06/2014), and right breast cancer (01/2015),  presents to clinic for follow up.   She is clinically doing well overall, she still has mild to moderate right-sided flank pain, intermittent, he resolved after her liver ablation, but started again in the past few months. She takes some Tylenol No. 4 one to twice a day, which controls her pain well. She otherwise feels well, has good appetite and energy level, denies any other symptoms. Her weight is stable.  MEDICAL HISTORY: Past Medical History:  Diagnosis Date  . Anxiety   . Breast CA ([place])    radiation and surgery-lt.  . Cancer ([place])    left breast cancer x2  . Colon cancer ([place])    Dr. Burr Medico seeing monthly  . Hypertension   . Liver lesion   . Pulmonary embolism ([place])    "blood clot in lungs" -dx. 4'15 with CT Chest. On Xarelto  . Radiation 01/22/10-03/12/10   left whole breast 4500 cGy, upper aspect boosted to 6120 cGy  . Radiation 02/21/16 - 04/10/16   right breast 50.4 Gy, right breast boost 12 Gy  . Shortness of breath    sob with exertion. dx. with Pulmonary emboli 4'15 ,tx. Xarelto,Lovenox used for [employer].    ALLERGIES:  is allergic to tramadol.  MEDICATIONS:    Medication List       Accurate as of 06/27/16 11:59 PM. Always use your most recent med list.          acetaminophen-codeine 300-60 MG tablet Commonly known as:  TYLENOL #4 Take 1 tablet by mouth every 6 (six) hours as needed. for pain   amLODipine 2.5 MG tablet Commonly known as:  NORVASC Take 2.5 mg by mouth daily.   ferrous sulfate 325 (65 FE) MG tablet Take 325 mg by mouth daily with breakfast.   hydrochlorothiazide 25 MG  tablet Commonly known as:  HYDRODIURIL Take 1 tablet (25 mg total) by mouth every morning.   letrozole 2.5 MG tablet Commonly known as:  FEMARA Take 1 tablet (2.5 mg total) by mouth daily.   metoprolol 50 MG tablet Commonly known as:  LOPRESSOR Take 1 tablet (50 mg total) by mouth 2 (two) times daily.   potassium chloride  SA 20 MEQ tablet Commonly known as:  K-DUR,KLOR-CON Take 1 tablet (20 mEq total) by mouth daily.   XARELTO 20 MG Tabs tablet Generic drug:  rivaroxaban Take 1 tablet by mouth daily with supper       SURGICAL HISTORY:  Past Surgical History:  Procedure Laterality Date  . BREAST LUMPECTOMY WITH RADIOACTIVE SEED AND SENTINEL LYMPH NODE BIOPSY Right 11/07/2015   Procedure: BREAST LUMPECTOMY WITH RADIOACTIVE SEED AND SENTINEL LYMPH NODE BIOPSY;  Surgeon: Stark Klein, MD;  Location: [facility];  Service: General;  Laterality: Right;  . BREAST SURGERY     '11- Left breast lumpectomy  . CESAREAN SECTION    . CHOLECYSTECTOMY     Laparoscopic 20 yrs ago  . COLON SURGERY  03-09-14   partial colectomy for cancer  . PARTIAL COLECTOMY N/A 03/09/2014   Procedure: LAPAROSCOPIC ASSISTED PARTIAL COLECTOMY, splenic flexure mobilization;  Surgeon: Leighton Ruff, MD;  Location: [facility];  Service: General;  Laterality: N/A;  . RADIOFREQUENCY ABLATION Right 08/24/2015   Procedure: MICROWAVE ABLATION RIGHT LIVER LOBE ;  Surgeon: Corrie Mckusick, DO;  Location: [facility];  Service: Anesthesiology;  Laterality: Right;    REVIEW OF SYSTEMS:   Constitutional: Denies fevers, chills or abnormal weight loss Eyes: Denies blurriness of vision Ears, nose, mouth, throat, and face: Denies mucositis or sore throat Respiratory: Denies cough, dyspnea or wheezes Cardiovascular: Denies palpitation, chest discomfort or lower extremity swelling Gastrointestinal:  Denies nausea, heartburn or change in bowel habits Skin: Denies abnormal skin rashes Lymphatics: Denies new lymphadenopathy or  easy bruising Neurological:Denies numbness, tingling or new weaknesses Behavioral/Psych: Mood is stable, no new changes  All other systems were reviewed with the patient and are negative. Breasts: Breast inspection showed them to be symmetrical with no nipple discharge. Palpation of the breasts and axilla revealed no obvious mass that I could appreciate.   Old surgical scar in the left breast. (+)  Skin erythema below both breasts   PHYSICAL EXAMINATION: ECOG PERFORMANCE STATUS: 1 Blood pressure (!) 158/88, pulse 66, temperature 99 F (37.2 C), temperature source Oral, resp. rate 17, height _0  (1.676 m), weight 285 lb 9.6 oz (129.5 kg), last menstrual period 01/23/2014, SpO2 100 %. GENERAL:alert, no distress and comfortable; well developed and obese. Easily mobile to exam table.  SKIN: skin color, texture, turgor are normal, no rashes or significant lesions EYES: normal, Conjunctiva are pink and non-injected, sclera clear OROPHARYNX:no exudate, no erythema and lips, buccal mucosa, and tongue normal  NECK: supple, thyroid normal size, non-tender, without nodularity LYMPH:  no palpable lymphadenopathy in the cervical, axillary or supraclavicular LUNGS: clear to auscultation with normal breathing effort, no wheezes or rhonchi HEART: regular rate & rhythm and no murmurs and no lower extremity edema ABDOMEN:abdomen soft, non-tender and normal bowel sounds; incision healing with signs of infection.  Mild tenderness at the  Right flank area, no abdominal tenderness. No local megaly Musculoskeletal:no cyanosis of digits and no clubbing, (+) tenderness at right flank area  NEURO: alert & oriented x 3 with fluent speech, no focal motor/sensory deficits Breasts: Breast inspection showed them to be symmetrical with no nipple discharge. The surgical scar in her right breast and axilla have healed very well. Palpation of the breasts and axilla revealed no obvious mass that I could appreciate.   Labs:    CBC Latest Ref Rng & Units 06/27/2016 05/28/2016 03/27/2016  WBC 3.9 - 10.3 10e3/uL 10.2 8.5 10.1  Hemoglobin 11.6 - 15.9 g/dL 13.6 13.8 14.7  Hematocrit 34.8 - 46.6 % 40.6 41.4 44.1  Platelets 145 - 400 10e3/uL 263 275 255    CMP Latest Ref Rng & Units 06/27/2016 05/28/2016 03/27/2016  Glucose 70 - 140 mg/dl 98 85 100  BUN 7.0 - 26.0 mg/dL 15.0 11.6 9.5  Creatinine 0.6 - 1.1 mg/dL 0.9 0.9 0.9  Sodium 136 - 145 mEq/L 138 141 141  Potassium 3.5 - 5.1 mEq/L 3.8 3.7 3.8  Chloride 101 - 111 mmol/L - - -  CO2 22 - 29 mEq/L _0 Calcium 8.4 - 10.4 mg/dL 9.6 9.7 9.8  Total Protein 6.4 - 8.3 g/dL 8.4(H) 8.0 8.4(H)  Total Bilirubin 0.20 - 1.20 mg/dL 0.42 0.49 0.77  Alkaline Phos 40 - 150 U/L 64 59 53  AST 5 - 34 U/L 43(H) 50(H) 53(H)  ALT 0 - 55 U/L 31 38 46   CEA  Order: [id number]  Status:  Final result Visible to patient:  Yes (MyChart) Next appt:  07/15/2016 at 01:15 PM in Oncology Burr Medico, Krista Blue, MD)    Ref Range & Units 31moago 457mogo [id number]   CEA 0.0 - 4.7 ng/mL 48.0   4.8CM   2.8CM         PATHOLOGY RESULT Diagnosis 01/02/2015 Liver, needle/core biopsy, right - POSITIVE FOR METASTATIC ADENOCARCINOMA. - SEE COMMENT. Microscopic Comment Immunohistochemical stains are performed. The tumor is positive for cytokeratin 20 and CDX-2. Cytokeratin 7 stains background bile ducts but is negative within the tumor. The tumor is negative for TTF-1 and estrogen receptor. The morphology coupled with the staining pattern is consistent with metastatic adenocarcinoma of colorectal primary source.   Diagnosis 11/07/2015 1. Breast, lumpectomy, Right - INVASIVE DUCTAL CARCINOMA, GRADE 2, SPANNING 1 CM. - DUCTAL CARCINOMA IN SITU, LOW GRADE. - RESECTION MARGINS ARE NEGATIVE FOR INVASIVE CARCINOMA. - DUCTAL CARCINOMA IN SITU COMES TO WITHIN 0.3 CM OF THE POSTERIOR MARGIN. - BIOPSY SITE. - SEE ONCOLOGY TABLE. 2. Lymph node, sentinel, biopsy, right axillary #1 - ONE OF ONE LYMPH NODES  NEGATIVE FOR CARCINOMA (0/1). 3. Lymph node, sentinel, biopsy, right axillary #2 - ONE OF ONE LYMPH NODES NEGATIVE FOR CARCINOMA (0/1). 4. Lymph node, sentinel, biopsy, right axillary #3 - ONE OF ONE LYMPH NODES NEGATIVE FOR CARCINOMA (0/1). Microscopic Comment 1. BREAST, INVASIVE TUMOR, WITH LYMPH NODES PRESENT Specimen, including laterality and lymph node sampling (sentinel, non-sentinel): Right breast lump and right axillary sentinel lymph nodes. Procedure: Right breast lumpectomy and right axillary sentinel lymph node biopsy. Histologic type: Invasive ductal carcinoma. Grade: 2. Tubule formation: 3 Nuclear pleomorphism: 2 Mitotic:1 Tumor size (gross measurement): 1 cm Margins: Invasive, distance to closest margin: 0.2 cm (posterior). In-situ, distance to closest margin: 0.3 cm (posterior, focal). If margin positive, focally or broadly: N/A. Lymphovascular invasion: Not identified. Ductal carcinoma in situ: Present. Grade: I Extensive intraductal component: No. Lobular neoplasia: Not identified. Tumor focality: Unifocal. Treatment effect: N/A. Extent of tumor: Confined to breast parenchyma. Lymph nodes: Examined: 3 Sentinel 0 Non-sentinel 3 Total Lymph nodes with metastasis: 0 Isolated tumor cells (< 0.2 mm): 0 Micrometastasis: (> 0.2 mm and < 2.0 mm): 0 Macrometastasis: (> 2.0 mm): 0 Extracapsular extension: N/A. Breast prognostic profile: Performed on biopsy ([id number] below. Her2 will be repeated on the current specimen. Estrogen receptor: Positive, 90%, strong staining intensity. Progesterone receptor: Positive, 10%, strong staining intensity. Her 2 neu: Negative. Ki-67: 10%. Non-neoplastic breast: Fibrocystic change and usual ductal hyperplasia. Biopsy site. TNM: pT1b, pN0 Comments: None  ONCOTYPE DX: RS 22,  which predicts a year risk of distant recurrence with tamoxifen alone 14%  Results: IMMUNOHISTOCHEMICAL AND MORPHOMETRIC ANALYSIS BY THE AUTOMATED  CELLULAR IMAGING SYSTEM (ACIS) Estrogen Receptor: 90%, POSITIVE, STRONG STAINING INTENSITY (PERFORMED MANUALLY) Progesterone Receptor: 10%, POSITIVE, STRONG STAINING INTENSITY (PERFORMED MANUALLY) Proliferation Marker Ki67: 10% 1. FLUORESCENCE IN-SITU HYBRIDIZATION Results: HER2 - NEGATIVE RATIO OF HER2/CEP17 SIGNALS 1.29 AVERAGE HER2 COPY NUMBER PER CELL 1.80  RADIOLOGY STUDIES   Abdomen MRI w wo contrast 05/29/2015  IMPRESSION: 1. Mild degradation secondary to patient body habitus. 2. Isolated segment 6 subcapsular liver lesion is similar. No new or progressive disease identified. 3. Interpolar right renal lesion is favored to represent a complex/septated cyst and is not significantly changed in size.  CT guided tissue ablation by Dr. Earleen Newport 08/24/2015 IMPRESSION: Status post image guided microwave ablation of solitary metastasis in the right liver lobe in this patient with oligo metastatic colorectal cancer.   CT CAP with contrast 06/17/2016 IMPRESSION: 1. Several new small pulmonary nodules worrisome for metastatic disease. 2. New large area of probable infiltrating recurrent tumor in the right hepatic lobe surrounding the prior ablation site. 3. Small supraclavicular lymph nodes.  Attention on future studies. 4. Stable surgical changes involving both breasts. No breast masses are identified. 5. Stable bilateral adrenal gland nodules. 6. Stable upper abdominal and right-sided retroperitoneal lymph nodes..   ASSESSMENT: Veronica Reyes 60 y.o. female with a history of   1. Colon Cancer, Stage I (pT2N0), MMR normal, oligo recurrent disease in liver in 12/2014, KRAS mutation (+), liver recurrence in 06/2016 -I previously reviewed the nature history of metastatic colon cancer, and the potential curative approach with chemotherapy followed by liver lesion ablation or resection. However, more likely, this is not curable disease. -She received 3 cycles of [place]ox, does not  want to proceed with fourth cycle due to the moderate side effects from previous chemotherapy treatment. She declined liver mass resection, and underwent liver ablation by interventional radiologist Dr. Earleen Newport in 08/2015. - I reviewed her restaging CT scan from 06/17/2016, which unfortunately showed new area of infiltrative disease as the previous ablated liver lesion, likely recurrence. She also developed several new pulmonary nodules, small, concerning for metastatic disease. -I recommend her to have a PET scan for further evaluation of the disease burden, to see if local therapy is still an option, given she is very reluctant to take chemo  -Her CEA level has recently increased significantly, most consistent with cancer progression -We discussed chemotherapy options, she will think about it   2. Right breast cancer, pT1bN0M0, stage Ia, ER+/PR+/HER2-, History of left Breast Cancer, diagnosed on 02/01/2015 --Left CIS in situ, s/p lumpectomy in 2006 and 2011 by Dr. Margot Chimes, and radiation in 2011 by Dr. Sondra Come.  -I reviewed her surgical pathology results from 11/10/2015, which showed a 1 cm grade 2 invasive ductal carcinoma and DCIS. Surgical margins were negative. -I reviewed her Oncotype DX result. Her recurrence score is 22, which predicts 14% 10 year risk of distant recurrence with tamoxifen alone. This is intermediate risk, based on her relatively low number, I do not recommend adjuvant chemotherapy. -She has completed adjuvant breast radiation -She has not had menstrual period for 2 years until one months ago she had one episode of vaginal bleeding. -her [place] level supports she is postmenopausal  -She has started adjuvant letrozole, we'll continue. -We also discussed breast cancer surveillance, I strongly encouraged her to continue annual screening mammogram, self exam, and follow-up with [place] routinely.  3. History of PE (  01/06/2014) --She has a diagnosis of pulmonary embolism, likely secondary to  malignancy in the setting of family history for stroke (and possible stroke history in patient as well). Lower extremity dopplers negative.  -continue Xarelto. Patient asked about if she can come off Xarelto. If she has no evidence of cancer in 6 months, I can potentially take her off Xarelto. She understands she has moderate to high risk of recurrent thrombosis if she is off anticoagulation.  4.  Uterine Fibroids  --Negative endometrial biopsy. Following with Gynecology.  5. Kidney lesion (Right). -No hypermetabolic right kidney lesion on the pet scan -Repeat abdominal MRI 05/29/2015 showed unchanged right renal lesion, favored to represent a complex/septated cyst.  6. Right flank pain - possible related to her liver metastasis -She'll continue Tylenol No. 4 as needed  7.  Morbid obesity - I encouraged her to eat healthy and exercise more. - I encouraged her to participate the " living well"  Program in our cancer center,  Which is designed for breast cancer survivor to improve your diet and exercise.  Plan  -PET scan in the next few weeks  -I'll see her back in 2 weeks   All questions were answered. The patient knows to call the clinic with any problems, questions or concerns. We can certainly see the patient much sooner if necessary.  I spent 25 minutes counseling the patient face to face. The total time spent in the appointment was 30 minutes.   [name]  06/28/2016

## 2016-06-27 NOTE — Telephone Encounter (Signed)
Gave patient avs report and appointments for October. Central radiology will call re scan.  °

## 2016-06-28 ENCOUNTER — Encounter: Payer: Self-pay | Admitting: Hematology

## 2016-07-08 ENCOUNTER — Ambulatory Visit (HOSPITAL_COMMUNITY)
Admission: RE | Admit: 2016-07-08 | Discharge: 2016-07-08 | Disposition: A | Discharge status: Home / Self Care | Payer: BLUE CROSS/BLUE SHIELD | Source: Ambulatory Visit | Attending: Hematology | Admitting: Hematology

## 2016-07-08 DIAGNOSIS — Z170 Estrogen receptor positive status [ER+]: Secondary | ICD-10-CM | POA: Diagnosis not present

## 2016-07-08 DIAGNOSIS — R918 Other nonspecific abnormal finding of lung field: Secondary | ICD-10-CM | POA: Insufficient documentation

## 2016-07-08 DIAGNOSIS — C50511 Malignant neoplasm of lower-outer quadrant of right female breast: Secondary | ICD-10-CM | POA: Insufficient documentation

## 2016-07-08 DIAGNOSIS — R160 Hepatomegaly, not elsewhere classified: Secondary | ICD-10-CM | POA: Insufficient documentation

## 2016-07-08 DIAGNOSIS — I700 Atherosclerosis of aorta: Secondary | ICD-10-CM | POA: Insufficient documentation

## 2016-07-08 DIAGNOSIS — I2510 Atherosclerotic heart disease of native coronary artery without angina pectoris: Secondary | ICD-10-CM | POA: Diagnosis not present

## 2016-07-08 LAB — GLUCOSE, CAPILLARY: GLUCOSE-CAPILLARY: 106 mg/dL — AB (ref 65–99)

## 2016-07-08 MED ORDER — FLUDEOXYGLUCOSE F - 18 (FDG) INJECTION
14.2000 | Freq: Once | INTRAVENOUS | Status: AC | PRN
  Administered 2016-07-08: 14.2 via INTRAVENOUS

## 2016-07-10 ENCOUNTER — Ambulatory Visit: Payer: BLUE CROSS/BLUE SHIELD | Admitting: Hematology

## 2016-07-10 ENCOUNTER — Other Ambulatory Visit: Payer: BLUE CROSS/BLUE SHIELD

## 2016-07-15 ENCOUNTER — Telehealth: Payer: Self-pay | Admitting: Hematology

## 2016-07-15 ENCOUNTER — Ambulatory Visit (HOSPITAL_BASED_OUTPATIENT_CLINIC_OR_DEPARTMENT_OTHER): Payer: BLUE CROSS/BLUE SHIELD | Admitting: Hematology

## 2016-07-15 ENCOUNTER — Encounter: Payer: Self-pay | Admitting: Hematology

## 2016-07-15 VITALS — BP 142/83 | Resp 18 | Ht 66.0 in | Wt 282.9 lb

## 2016-07-15 DIAGNOSIS — C50511 Malignant neoplasm of lower-outer quadrant of right female breast: Secondary | ICD-10-CM

## 2016-07-15 DIAGNOSIS — I10 Essential (primary) hypertension: Secondary | ICD-10-CM | POA: Diagnosis not present

## 2016-07-15 DIAGNOSIS — C187 Malignant neoplasm of sigmoid colon: Secondary | ICD-10-CM | POA: Diagnosis not present

## 2016-07-15 DIAGNOSIS — Z86718 Personal history of other venous thrombosis and embolism: Secondary | ICD-10-CM

## 2016-07-15 DIAGNOSIS — Z170 Estrogen receptor positive status [ER+]: Secondary | ICD-10-CM

## 2016-07-15 DIAGNOSIS — E66813 Obesity, class 3: Secondary | ICD-10-CM

## 2016-07-15 MED ORDER — ACETAMINOPHEN-CODEINE #4 300-60 MG PO TABS: 1.0000 | ORAL_TABLET | Freq: Three times a day (TID) | ORAL | 0 refills | Status: DC | PRN

## 2016-07-15 NOTE — Progress Notes (Deleted)
[facility] PROGRESS NOTE DATE OF VISIT: 07/15/2016   Veronica Docker, MD 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 Left breast DCIS diagnosed in 2006 and 2010, status post lumpectomy. 2. pT2N0M0 stage I colon cancer, s/p partial colectomy on 03/09/2014  3. PE in 12/2013 when she was diagnosed with colon cancer. 4. 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 [facility])   Staging form: Breast, AJCC 7th Edition     Clinical stage from 02/01/2015: Stage IA (T1a, N0, M0) - Signed by Veronica Merle, MD on 06/04/2015     Pathologic stage from 11/10/2015: Stage IA (T1b, N0, M0) - Signed by Veronica Merle, MD on 12/08/2015 Cancer of sigmoid colon [facility])   Staging form: Colon and Rectum, AJCC 7th Edition     Clinical: Stage I (T2, N0, M0) - Unsigned     Breast cancer of lower-outer quadrant of right female breast ([place])   2006 Cancer Diagnosis    Left breast DCIS diagnosed in 2006 and 2010, status post lumpectomy.      01/07/2014 Initial Diagnosis    Breast cancer      01/18/2015 Mammogram    34m mass in lower outer quadrant of right breast       02/01/2015 Initial Biopsy    (R) breast mass biopsy showed invasive ductal carcinoma & DCIS, grade 1-2.       02/01/2015 Receptors her2    ER 90%+, PR 10%+, Ki67 10%, HER2 negative (ratio 1.09)       11/07/2015 Surgery    (R) breast lumpectomy and sentinel lymph node biopsy (Veronica Reyes      11/07/2015 Pathology Results    (R) breast lumpectomy showed invasive ductal carcinoma, grade 2, 1 cm, low-grade DCIS, negative  margins, 3 sentinel lymph nodes were negative. LVI (-). HER2 repeated and remains negative (ratio 1.29).        11/07/2015 Pathologic Stage    pT1b,pN0: Stage IA       11/07/2015 Oncotype testing    RS 22, which predicts a year risk of distant recurrence with tamoxifen alone 14% (intermediate-risk). Adjuvant chemo was not offered      02/21/2016 - 04/10/2016 Radiation Therapy    Adjuvant breast radiation. (Veronica Reyes). Right breast: 50.4 Gy in 28 fractions.  Right breast boost: 12 Gy in 6 fractions.      05/2016 -  Anti-estrogen oral therapy    Femara 2.5 mg daily.        Cancer of sigmoid colon ([place]   03/04/2014 Initial Diagnosis    Cancer of sigmoid colon      03/09/2014 Pathology Results    G1 invasive adenocarcinoma, no lymph-Vascular invasion or Peri-neural invasion: Absent. MMR normal, MSI stable.       03/09/2014 Surgery    partial colectomy, negative margins.       11/10/2014 Relapse/Recurrence    biopsy confirmed oligo liver recurrence       11/10/2014 Imaging    PET showed two small ares of hypermetabolism in the right lobe of the liver, no other distant metastasis.       11/30/2014 Imaging  abdomen MRI showed a new enhancing lesion which corresponds to an area of abnormal hyper metabolism on recent PET-CT. Stable enlarged portal caval lymph node.       01/02/2015 Pathology Results    Liver biopsy showed metastatic adenocarcinoma, IHC (+) for CK20, CDX-2, (-) for TTF-1 and ER      01/02/2015 Miscellaneous    liver biopsy KRAS mutation (+)       02/14/2015 Imaging    CT showed:  the small metastatic right hepatic lobe liver lesion is not identified for certain on this examination. No new lesions. Stable small cysts in segment 4 a.       03/05/2015 - 04/16/2015 Chemotherapy    CAPEOX (capecitabine 2500 mg twice daily Day 1-14, oxaliplatin 1 30 mg/m on day 1, every 21 days, S/P 3 cycles       08/24/2015 Procedure    CT-guided oligo liver metastasis microwave ablation  by Dr. Bobetta Reyes      07/08/2016 PET scan    1. Large mass in the right lobe of the liver demonstrates diffuse hypermetabolism, compatible with recurrent metastatic disease in the liver 2. Multiple small pulmonary nodules are very similar to the recent Chest CT 06/17/16.  3. No other findings of metastatic disease elsewhere in the neck, chest, abdomen, or pelvis. 4. There is some low-level hypermetabolic activity adjacent to the surgical clips in the lateral aspect of the right breast at the site of prior lumpectomy.         CURRENT TREATMENT:  Letrozole 2.5 mg once daily, started in September 2017  INTERVAL HISTORY:  Veronica Reyes 60 y.o. female with a history of recurrent left breast cancer, colon cancer s/p laparoscopic-assisted partial colectomy (03/09/2014), PE (01/06/2014), and right breast cancer (01/2015),  presents to clinic for follow up.   She is clinically doing well overall, she still has mild to moderate right-sided flank pain, intermittent, he resolved after her liver ablation, but started again in the past few months. She takes some Tylenol No. 4 one to twice a day, which controls her pain well. She otherwise feels well, has good appetite and energy level, denies any other symptoms. Her weight is stable.  MEDICAL HISTORY: Past Medical History:  Diagnosis Date  . Anxiety   . Breast CA ([place])    radiation and surgery-lt.  . Cancer ([place])    left breast cancer x2  . Colon cancer ([place])    Veronica Reyes seeing monthly  . Hypertension   . Liver lesion   . Pulmonary embolism ([place])    "blood clot in lungs" -dx. 4'15 with CT Chest. On Xarelto  . Radiation 01/22/10-03/12/10   left whole breast 4500 cGy, upper aspect boosted to 6120 cGy  . Radiation 02/21/16 - 04/10/16   right breast 50.4 Gy, right breast boost 12 Gy  . Shortness of breath    sob with exertion. dx. with Pulmonary emboli 4'15 ,tx. Xarelto,Lovenox used for [employer].    ALLERGIES:  is allergic to  tramadol.  MEDICATIONS:    Medication List       Accurate as of 07/15/16  2:35 PM. Always use your most recent med list.          acetaminophen-codeine 300-60 MG tablet Commonly known as:  TYLENOL #4 Take 1 tablet by mouth every 8 (eight) hours as needed. for pain   amLODipine 2.5 MG tablet Commonly known as:  NORVASC Take 2.5 mg by mouth daily.   ferrous sulfate 325 (65 FE) MG tablet  Take 325 mg by mouth daily with breakfast.   hydrochlorothiazide 25 MG tablet Commonly known as:  HYDRODIURIL Take 1 tablet (25 mg total) by mouth every morning.   letrozole 2.5 MG tablet Commonly known as:  FEMARA Take 1 tablet (2.5 mg total) by mouth daily.   metoprolol 50 MG tablet Commonly known as:  LOPRESSOR Take 1 tablet (50 mg total) by mouth 2 (two) times daily.   potassium chloride SA 20 MEQ tablet Commonly known as:  K-DUR,KLOR-CON Take 1 tablet (20 mEq total) by mouth daily.   XARELTO 20 MG Tabs tablet Generic drug:  rivaroxaban Take 1 tablet by mouth daily with supper       SURGICAL HISTORY:  Past Surgical History:  Procedure Laterality Date  . BREAST LUMPECTOMY WITH RADIOACTIVE SEED AND SENTINEL LYMPH NODE BIOPSY Right 11/07/2015   Procedure: BREAST LUMPECTOMY WITH RADIOACTIVE SEED AND SENTINEL LYMPH NODE BIOPSY;  Surgeon: Stark Klein, MD;  Location: [facility];  Service: General;  Laterality: Right;  . BREAST SURGERY     '11- Left breast lumpectomy  . CESAREAN SECTION    . CHOLECYSTECTOMY     Laparoscopic 20 yrs ago  . COLON SURGERY  03-09-14   partial colectomy for cancer  . PARTIAL COLECTOMY N/A 03/09/2014   Procedure: LAPAROSCOPIC ASSISTED PARTIAL COLECTOMY, splenic flexure mobilization;  Surgeon: Leighton Ruff, MD;  Location: [facility];  Service: General;  Laterality: N/A;  . RADIOFREQUENCY ABLATION Right 08/24/2015   Procedure: MICROWAVE ABLATION RIGHT LIVER LOBE ;  Surgeon: Corrie Mckusick, DO;  Location: [facility];  Service: Anesthesiology;   Laterality: Right;    REVIEW OF SYSTEMS:   Constitutional: Denies fevers, chills or abnormal weight loss Eyes: Denies blurriness of vision Ears, nose, mouth, throat, and face: Denies mucositis or sore throat Respiratory: Denies cough, dyspnea or wheezes Cardiovascular: Denies palpitation, chest discomfort or lower extremity swelling Gastrointestinal:  Denies nausea, heartburn or change in bowel habits Skin: Denies abnormal skin rashes Lymphatics: Denies new lymphadenopathy or easy bruising Neurological:Denies numbness, tingling or new weaknesses Behavioral/Psych: Mood is stable, no new changes  All other systems were reviewed with the patient and are negative. Breasts: Breast inspection showed them to be symmetrical with no nipple discharge. Palpation of the breasts and axilla revealed no obvious mass that I could appreciate.   Old surgical scar in the left breast. (+)  Skin erythema below both breasts   PHYSICAL EXAMINATION: ECOG PERFORMANCE STATUS: 1 Blood pressure (!) 142/83, resp. rate 18, height '5\' 6"'  (1.676 m), weight 282 lb 14.4 oz (128.3 kg), last menstrual period 01/23/2014. GENERAL:alert, no distress and comfortable; well developed and obese. Easily mobile to exam table.  SKIN: skin color, texture, turgor are normal, no rashes or significant lesions EYES: normal, Conjunctiva are pink and non-injected, sclera clear OROPHARYNX:no exudate, no erythema and lips, buccal mucosa, and tongue normal  NECK: supple, thyroid normal size, non-tender, without nodularity LYMPH:  no palpable lymphadenopathy in the cervical, axillary or supraclavicular LUNGS: clear to auscultation with normal breathing effort, no wheezes or rhonchi HEART: regular rate & rhythm and no murmurs and no lower extremity edema ABDOMEN:abdomen soft, non-tender and normal bowel sounds; incision healing with signs of infection.  Mild tenderness at the  Right flank area, no abdominal tenderness. No local  megaly Musculoskeletal:no cyanosis of digits and no clubbing, (+) tenderness at right flank area  NEURO: alert & oriented x 3 with fluent speech, no focal motor/sensory deficits Breasts: Breast inspection showed them to be symmetrical with  no nipple discharge. The surgical scar in her right breast and axilla have healed very well. Palpation of the breasts and axilla revealed no obvious mass that I could appreciate.   Labs:  CBC Latest Ref Rng & Units 06/27/2016 05/28/2016 03/27/2016  WBC 3.9 - 10.3 10e3/uL 10.2 8.5 10.1  Hemoglobin 11.6 - 15.9 g/dL 13.6 13.8 14.7  Hematocrit 34.8 - 46.6 % 40.6 41.4 44.1  Platelets 145 - 400 10e3/uL 263 275 255    CMP Latest Ref Rng & Units 06/27/2016 05/28/2016 03/27/2016  Glucose 70 - 140 mg/dl 98 85 100  BUN 7.0 - 26.0 mg/dL 15.0 11.6 9.5  Creatinine 0.6 - 1.1 mg/dL 0.9 0.9 0.9  Sodium 136 - 145 mEq/L 138 141 141  Potassium 3.5 - 5.1 mEq/L 3.8 3.7 3.8  Chloride 101 - 111 mmol/L - - -  CO2 22 - 29 mEq/L '27 28 28  ' Calcium 8.4 - 10.4 mg/dL 9.6 9.7 9.8  Total Protein 6.4 - 8.3 g/dL 8.4(H) 8.0 8.4(H)  Total Bilirubin 0.20 - 1.20 mg/dL 0.42 0.49 0.77  Alkaline Phos 40 - 150 U/L 64 59 53  AST 5 - 34 U/L 43(H) 50(H) 53(H)  ALT 0 - 55 U/L 31 38 46   CEA  Order: [id number]  Status:  Final result Visible to patient:  Yes (MyChart) Next appt:  07/15/2016 at 01:15 PM in Oncology Burr Reyes, Krista Blue, MD)    Ref Range & Units 32moago 459mogo [id number]   CEA 0.0 - 4.7 ng/mL 48.0   4.8CM   2.8CM         PATHOLOGY RESULT Diagnosis 01/02/2015 Liver, needle/core biopsy, right - POSITIVE FOR METASTATIC ADENOCARCINOMA. - SEE COMMENT. Microscopic Comment Immunohistochemical stains are performed. The tumor is positive for cytokeratin 20 and CDX-2. Cytokeratin 7 stains background bile ducts but is negative within the tumor. The tumor is negative for TTF-1 and estrogen receptor. The morphology coupled with the staining pattern is consistent with metastatic adenocarcinoma of  colorectal primary source.   Diagnosis 11/07/2015 1. Breast, lumpectomy, Right - INVASIVE DUCTAL CARCINOMA, GRADE 2, SPANNING 1 CM. - DUCTAL CARCINOMA IN SITU, LOW GRADE. - RESECTION MARGINS ARE NEGATIVE FOR INVASIVE CARCINOMA. - DUCTAL CARCINOMA IN SITU COMES TO WITHIN 0.3 CM OF THE POSTERIOR MARGIN. - BIOPSY SITE. - SEE ONCOLOGY TABLE. 2. Lymph node, sentinel, biopsy, right axillary #1 - ONE OF ONE LYMPH NODES NEGATIVE FOR CARCINOMA (0/1). 3. Lymph node, sentinel, biopsy, right axillary #2 - ONE OF ONE LYMPH NODES NEGATIVE FOR CARCINOMA (0/1). 4. Lymph node, sentinel, biopsy, right axillary #3 - ONE OF ONE LYMPH NODES NEGATIVE FOR CARCINOMA (0/1). Microscopic Comment 1. BREAST, INVASIVE TUMOR, WITH LYMPH NODES PRESENT Specimen, including laterality and lymph node sampling (sentinel, non-sentinel): Right breast lump and right axillary sentinel lymph nodes. Procedure: Right breast lumpectomy and right axillary sentinel lymph node biopsy. Histologic type: Invasive ductal carcinoma. Grade: 2. Tubule formation: 3 Nuclear pleomorphism: 2 Mitotic:1 Tumor size (gross measurement): 1 cm Margins: Invasive, distance to closest margin: 0.2 cm (posterior). In-situ, distance to closest margin: 0.3 cm (posterior, focal). If margin positive, focally or broadly: N/A. Lymphovascular invasion: Not identified. Ductal carcinoma in situ: Present. Grade: I Extensive intraductal component: No. Lobular neoplasia: Not identified. Tumor focality: Unifocal. Treatment effect: N/A. Extent of tumor: Confined to breast parenchyma. Lymph nodes: Examined: 3 Sentinel 0 Non-sentinel 3 Total Lymph nodes with metastasis: 0 Isolated tumor cells (< 0.2 mm): 0 Micrometastasis: (> 0.2 mm and < 2.0 mm): 0 Macrometastasis: (> 2.0  mm): 0 Extracapsular extension: N/A. Breast prognostic profile: Performed on biopsy ([id number]), see below. Her2 will be repeated on the current specimen. Estrogen receptor:  Positive, 90%, strong staining intensity. Progesterone receptor: Positive, 10%, strong staining intensity. Her 2 neu: Negative. Ki-67: 10%. Non-neoplastic breast: Fibrocystic change and usual ductal hyperplasia. Biopsy site. TNM: pT1b, pN0 Comments: None  ONCOTYPE DX: RS 22, which predicts a year risk of distant recurrence with tamoxifen alone 14%  Results: IMMUNOHISTOCHEMICAL AND MORPHOMETRIC ANALYSIS BY THE AUTOMATED CELLULAR IMAGING SYSTEM (ACIS) Estrogen Receptor: 90%, POSITIVE, STRONG STAINING INTENSITY (PERFORMED MANUALLY) Progesterone Receptor: 10%, POSITIVE, STRONG STAINING INTENSITY (PERFORMED MANUALLY) Proliferation Marker Ki67: 10% 1. FLUORESCENCE IN-SITU HYBRIDIZATION Results: HER2 - NEGATIVE RATIO OF HER2/CEP17 SIGNALS 1.29 AVERAGE HER2 COPY NUMBER PER CELL 1.80  RADIOLOGY STUDIES   Abdomen MRI w wo contrast 05/29/2015  IMPRESSION: 1. Mild degradation secondary to patient body habitus. 2. Isolated segment 6 subcapsular liver lesion is similar. No new or progressive disease identified. 3. Interpolar right renal lesion is favored to represent a complex/septated cyst and is not significantly changed in size.  CT guided tissue ablation by Dr. Earleen Newport 08/24/2015 IMPRESSION: Status post image guided microwave ablation of solitary metastasis in the right liver lobe in this patient with oligo metastatic colorectal cancer.   CT CAP with contrast 06/17/2016 IMPRESSION: 1. Several new small pulmonary nodules worrisome for metastatic disease. 2. New large area of probable infiltrating recurrent tumor in the right hepatic lobe surrounding the prior ablation site. 3. Small supraclavicular lymph nodes.  Attention on future studies. 4. Stable surgical changes involving both breasts. No breast masses are identified. 5. Stable bilateral adrenal gland nodules. 6. Stable upper abdominal and right-sided retroperitoneal lymph nodes.Marland Kitchen  PET 07/08/2016 IMPRESSION: 1. Large mass  in the right lobe of the liver demonstrates diffuse hypermetabolism, compatible with recurrent metastatic disease in the liver. 2. Multiple small pulmonary nodules are very similar to the recent chest CT 06/17/2016. These do not demonstrate hypermetabolism on the PET images, however, these lesions are well below the level of PET imaging. Given that these nodules are new compared to prior chest CT 12/18/2015, they are concerning for potential metastatic disease to the lungs, and continued attention on followup studies is recommended. 3. No other findings of metastatic disease elsewhere in the neck, chest, abdomen or pelvis. 4. There is some low-level hypermetabolic activity adjacent to the surgical clips in the lateral aspect of the right breast at the site of prior lumpectomy. This area has undergone interval involution over the several prior examinations, and this low-level activity is favored to be related to normal healing. Attention on follow-up imaging is recommended to ensure the stability or resolution of this finding. 5. Aortic atherosclerosis, in addition to 2 vessel coronary artery disease. Please note that although the presence of coronary artery calcium documents the presence of coronary artery disease, the severity of this disease and any potential stenosis cannot be assessed on this non-gated CT examination. Assessment for potential risk factor modification, dietary therapy or pharmacologic therapy may be warranted, if clinically indicated. 6. Additional incidental findings, as above.   ASSESSMENT: Veronica Reyes 60 y.o. female with a history of   1. Colon Cancer, Stage I (pT2N0), MMR normal, oligo recurrent disease in liver in 12/2014, KRAS mutation (+), liver recurrence in 06/2016 -I previously reviewed the nature history of metastatic colon cancer, and the potential curative approach with chemotherapy followed by liver lesion ablation or resection. However, more  likely, this is not curable disease. -She received  3 cycles of Capeox, does not want to proceed with fourth cycle due to the moderate side effects from previous chemotherapy treatment. She declined liver mass resection, and underwent liver ablation by interventional radiologist Dr. Earleen Newport in 08/2015. - I reviewed her restaging CT scan from 06/17/2016, which unfortunately showed new area of infiltrative disease as the previous ablated liver lesion, likely recurrence. She also developed several new pulmonary nodules, small, concerning for metastatic disease. -I recommend her to have a PET scan for further evaluation of the disease burden, to see if local therapy is still an option, given she is very reluctant to take chemo  -Her CEA level has recently increased significantly, most consistent with cancer progression -We discussed chemotherapy options, she will think about it   2. Right breast cancer, pT1bN0M0, stage Ia, ER+/PR+/HER2-, History of left Breast Cancer, diagnosed on 02/01/2015 --Left CIS in situ, s/p lumpectomy in 2006 and 2011 by Dr. Margot Chimes, and radiation in 2011 by Dr. Sondra Come.  -I reviewed her surgical pathology results from 11/10/2015, which showed a 1 cm grade 2 invasive ductal carcinoma and DCIS. Surgical margins were negative. -I reviewed her Oncotype DX result. Her recurrence score is 22, which predicts 14% 10 year risk of distant recurrence with tamoxifen alone. This is intermediate risk, based on her relatively low number, I do not recommend adjuvant chemotherapy. -She has completed adjuvant breast radiation -She has not had menstrual period for 2 years until one months ago she had one episode of vaginal bleeding. -her [place] level supports she is postmenopausal  -She has started adjuvant letrozole, we'll continue. -We also discussed breast cancer surveillance, I strongly encouraged her to continue annual screening mammogram, self exam, and follow-up with [place] routinely.  3. History of PE  (01/06/2014) --She has a diagnosis of pulmonary embolism, likely secondary to malignancy in the setting of family history for stroke (and possible stroke history in patient as well). Lower extremity dopplers negative.  -continue Xarelto. Patient asked about if she can come off Xarelto. If she has no evidence of cancer in 6 months, I can potentially take her off Xarelto. She understands she has moderate to high risk of recurrent thrombosis if she is off anticoagulation.  4.  Uterine Fibroids  --Negative endometrial biopsy. Following with Gynecology.  5. Kidney lesion (Right). -No hypermetabolic right kidney lesion on the pet scan -Repeat abdominal MRI 05/29/2015 showed unchanged right renal lesion, favored to represent a complex/septated cyst.  6. Right flank pain - possible related to her liver metastasis -She'll continue Tylenol No. 4 as needed  7.  Morbid obesity - I encouraged her to eat healthy and exercise more. - I encouraged her to participate the " living well"  Program in our cancer center,  Which is designed for breast cancer survivor to improve your diet and exercise.  Plan  -PET scan in the next few weeks  -I'll see her back in 2 weeks   All questions were answered. The patient knows to call the clinic with any problems, questions or concerns. We can certainly see the patient much sooner if necessary.  I spent 25 minutes counseling the patient face to face. The total time spent in the appointment was 30 minutes.   Veronica Reyes  07/15/2016

## 2016-07-15 NOTE — Progress Notes (Signed)
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 Left breast DCIS diagnosed in 2006 and 2010, status post lumpectomy. 2. pT2N0M0 stage I colon cancer, s/p partial colectomy on 03/09/2014  3. PE in 12/2013 when she was diagnosed with colon cancer. 4. 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 [facility])   Staging form: Breast, AJCC 7th Edition     Clinical stage from 02/01/2015: Stage IA (T1a, N0, M0) - Signed by [name], MD on 06/04/2015     Pathologic stage from 11/10/2015: Stage IA (T1b, N0, M0) - Signed by [name], MD on 12/08/2015 Cancer of sigmoid colon [facility])   Staging form: Colon and Rectum, AJCC 7th Edition     Clinical: Stage I (T2, N0, M0) - Unsigned     Breast cancer of lower-outer quadrant of right female breast ([place])   2006 Cancer Diagnosis    Left breast DCIS diagnosed in 2006 and 2010, status post lumpectomy.      01/07/2014 Initial Diagnosis    Breast cancer      01/18/2015 Mammogram    33m mass in lower outer quadrant of right breast       02/01/2015 Initial Biopsy    (R) breast mass biopsy showed invasive ductal carcinoma & DCIS, grade 1-2.       02/01/2015 Receptors her2    ER 90%+, PR 10%+, Ki67 10%, HER2 negative (ratio 1.09)       11/07/2015 Surgery    (R) breast lumpectomy and sentinel lymph node biopsy ([name]      11/07/2015 Pathology Results    (R) breast lumpectomy showed invasive ductal carcinoma, grade 2, 1 cm, low-grade DCIS, negative  margins, 3 sentinel lymph nodes were negative. LVI (-). HER2 repeated and remains negative (ratio 1.29).        11/07/2015 Pathologic Stage    pT1b,pN0: Stage IA       11/07/2015 Oncotype testing    RS 22, which predicts a year risk of distant recurrence with tamoxifen alone 14% (intermediate-risk). Adjuvant chemo was not offered      02/21/2016 - 04/10/2016 Radiation Therapy    Adjuvant breast radiation. ([name]). Right breast: 50.4 Gy in 28 fractions.  Right breast boost: 12 Gy in 6 fractions.      05/2016 -  Anti-estrogen oral therapy    Femara 2.5 mg daily.        [place]   03/04/2014 Initial Diagnosis    Cancer of sigmoid colon      03/09/2014 Pathology Results    G1 invasive adenocarcinoma, no lymph-Vascular invasion or Peri-neural invasion: Absent. MMR normal, MSI stable.       03/09/2014 Surgery    partial colectomy, negative margins.       11/10/2014 Relapse/Recurrence    biopsy confirmed oligo liver recurrence       11/10/2014 Imaging    PET showed two small ares of hypermetabolism in the right lobe of the liver, no other distant metastasis.       11/30/2014 Imaging  abdomen MRI showed a new enhancing lesion which corresponds to an area of abnormal hyper metabolism on recent PET-CT. Stable enlarged portal caval lymph node.       01/02/2015 Pathology Results    Liver biopsy showed metastatic adenocarcinoma, IHC (+) for CK20, CDX-2, (-) for TTF-1 and ER      01/02/2015 Miscellaneous    liver biopsy KRAS mutation (+)       02/14/2015 Imaging    CT showed:  the small metastatic right hepatic lobe liver lesion is not identified for certain on this examination. No new lesions. Stable small cysts in segment 4 a.       03/05/2015 - 04/16/2015 Chemotherapy    CAPEOX (capecitabine 2500 mg twice daily Day 1-14, oxaliplatin 1 30 mg/m on day 1, every 21 days, S/P 3 cycles       08/24/2015 Procedure    CT-guided oligo liver metastasis microwave ablation  by Dr. [name]        CURRENT TREATMENT:  Letrozole 2.5 mg once daily, started in September 2017  INTERVAL HISTORY:  Veronica Reyes 60 y.o. female with a history of recurrent left breast cancer, colon cancer s/p laparoscopic-assisted partial colectomy (03/09/2014), PE (01/06/2014), and right breast cancer (01/2015),  presents to clinic for follow up.   She is clinically doing well overall. She reports mild to moderate right-sided flank pain continues intermittently. She describes it as a nagging feeling. She takes Tylenol No. 4 once daily. She denies any other pain.  Her appetite is good. She has no other concerns or complaints at this time. She is taking xarelto.  She did not like chemo in the past because of the cold sensitivity.   MEDICAL HISTORY: Past Medical History:  Diagnosis Date  . Anxiety   . Breast CA ([place])    radiation and surgery-lt.  . Cancer ([place])    left breast cancer x2  . Colon cancer ([place])    Dr. Burr Medico seeing monthly  . Hypertension   . Liver lesion   . Pulmonary embolism ([place])    "blood clot in lungs" -dx. 4'15 with CT Chest. On Xarelto  . Radiation 01/22/10-03/12/10   left whole breast 4500 cGy, upper aspect boosted to 6120 cGy  . Radiation 02/21/16 - 04/10/16   right breast 50.4 Gy, right breast boost 12 Gy  . Shortness of breath    sob with exertion. dx. with Pulmonary emboli 4'15 ,tx. Xarelto,Lovenox used for [employer].    ALLERGIES:  is allergic to tramadol.  MEDICATIONS:    Medication List       Accurate as of 07/15/16  1:47 PM. Always use your most recent med list.          acetaminophen-codeine 300-60 MG tablet Commonly known as:  TYLENOL #4 Take 1 tablet by mouth every 6 (six) hours as needed. for pain   amLODipine 2.5 MG tablet Commonly known as:  NORVASC Take 2.5 mg by mouth daily.   ferrous sulfate 325 (65 FE) MG tablet Take 325 mg by mouth daily with breakfast.   hydrochlorothiazide 25 MG tablet Commonly known as:   HYDRODIURIL Take 1 tablet (25 mg total) by mouth every morning.   letrozole 2.5 MG tablet Commonly known as:  FEMARA Take 1 tablet (2.5 mg total) by mouth daily.   metoprolol 50 MG tablet Commonly known as:  LOPRESSOR Take 1 tablet (50 mg total) by mouth 2 (two) times daily.   potassium chloride SA 20 MEQ tablet Commonly known as:  K-DUR,KLOR-CON Take 1 tablet (20 mEq total) by mouth daily.   XARELTO 20 MG Tabs tablet Generic drug:  rivaroxaban Take 1 tablet by mouth daily with supper       SURGICAL HISTORY:  Past Surgical History:  Procedure Laterality Date  . BREAST LUMPECTOMY WITH RADIOACTIVE SEED AND SENTINEL LYMPH NODE BIOPSY Right 11/07/2015   Procedure: BREAST LUMPECTOMY WITH RADIOACTIVE SEED AND SENTINEL LYMPH NODE BIOPSY;  Surgeon: Stark Klein, MD;  Location: [facility];  Service: General;  Laterality: Right;  . BREAST SURGERY     '11- Left breast lumpectomy  . CESAREAN SECTION    . CHOLECYSTECTOMY     Laparoscopic 20 yrs ago  . COLON SURGERY  03-09-14   partial colectomy for cancer  . PARTIAL COLECTOMY N/A 03/09/2014   Procedure: LAPAROSCOPIC ASSISTED PARTIAL COLECTOMY, splenic flexure mobilization;  Surgeon: Leighton Ruff, MD;  Location: [facility];  Service: General;  Laterality: N/A;  . RADIOFREQUENCY ABLATION Right 08/24/2015   Procedure: MICROWAVE ABLATION RIGHT LIVER LOBE ;  Surgeon: Corrie Mckusick, DO;  Location: [facility];  Service: Anesthesiology;  Laterality: Right;    REVIEW OF SYSTEMS:   Constitutional: Denies fevers, chills or abnormal weight loss Eyes: Denies blurriness of vision Ears, nose, mouth, throat, and face: Denies mucositis or sore throat Respiratory: Denies cough, dyspnea or wheezes Cardiovascular: Denies palpitation, chest discomfort or lower extremity swelling Gastrointestinal:  Denies nausea, heartburn or change in bowel habits Skin: Denies abnormal skin rashes Musculoskeletal: (+) intermittent right-sided flank pain Lymphatics:  Denies new lymphadenopathy or easy bruising Neurological:Denies numbness, tingling or new weaknesses Behavioral/Psych: Mood is stable, no new changes  All other systems were reviewed with the patient and are negative.    PHYSICAL EXAMINATION: ECOG PERFORMANCE STATUS: 1 Blood pressure (!) 142/83, resp. rate 18, height '5\' 6"'  (1.676 m), weight 282 lb 14.4 oz (128.3 kg), last menstrual period 01/23/2014. GENERAL:alert, no distress and comfortable; well developed and obese. Easily mobile to exam table.  SKIN: skin color, texture, turgor are normal, no rashes or significant lesions EYES: normal, Conjunctiva are pink and non-injected, sclera clear OROPHARYNX:no exudate, no erythema and lips, buccal mucosa, and tongue normal  NECK: supple, thyroid normal size, non-tender, without nodularity LYMPH:  no palpable lymphadenopathy in the cervical, axillary or supraclavicular LUNGS: clear to auscultation with normal breathing effort, no wheezes or rhonchi HEART: regular rate & rhythm and no murmurs and no lower extremity edema ABDOMEN:abdomen soft, non-tender and normal bowel sounds; incision healing with signs of infection.  Mild tenderness at the  Right flank area, no abdominal tenderness. No local megaly Musculoskeletal:no cyanosis of digits and no clubbing, (+) tenderness at right flank area  NEURO: alert & oriented x 3 with fluent speech, no focal motor/sensory deficits   Labs:  CBC Latest Ref Rng & Units 06/27/2016 05/28/2016 03/27/2016  WBC 3.9 - 10.3 10e3/uL 10.2 8.5 10.1  Hemoglobin 11.6 - 15.9 g/dL 13.6 13.8 14.7  Hematocrit 34.8 - 46.6 % 40.6 41.4 44.1  Platelets 145 - 400 10e3/uL 263 275 255    CMP Latest Ref Rng & Units 06/27/2016 05/28/2016 03/27/2016  Glucose 70 - 140 mg/dl 98 85 100  BUN 7.0 - 26.0 mg/dL 15.0 11.6 9.5  Creatinine 0.6 - 1.1 mg/dL 0.9 0.9 0.9  Sodium 136 - 145 mEq/L 138 141 141  Potassium 3.5 - 5.1 mEq/L 3.8 3.7 3.8  Chloride 101 - 111 mmol/L - - -  CO2 22 - 29 mEq/L  '27 28 28  ' Calcium 8.4 -  10.4 mg/dL 9.6 9.7 9.8  Total Protein 6.4 - 8.3 g/dL 8.4(H) 8.0 8.4(H)  Total Bilirubin 0.20 - 1.20 mg/dL 0.42 0.49 0.77  Alkaline Phos 40 - 150 U/L 64 59 53  AST 5 - 34 U/L 43(H) 50(H) 53(H)  ALT 0 - 55 U/L 31 38 46   CEA  Order: [id number]  Status:  Final result Visible to patient:  Yes (MyChart) Next appt:  07/15/2016 at 01:15 PM in Oncology Burr Medico, Krista Blue, MD)    Ref Range & Units 37moago 44mogo 35m29moo   CEA 0.0 - 4.7 ng/mL 48.0   4.8CM   2.8CM         PATHOLOGY RESULT Diagnosis 01/02/2015 Liver, needle/core biopsy, right - POSITIVE FOR METASTATIC ADENOCARCINOMA. - SEE COMMENT. Microscopic Comment Immunohistochemical stains are performed. The tumor is positive for cytokeratin 20 and CDX-2. Cytokeratin 7 stains background bile ducts but is negative within the tumor. The tumor is negative for TTF-1 and estrogen receptor. The morphology coupled with the staining pattern is consistent with metastatic adenocarcinoma of colorectal primary source.   Diagnosis 11/07/2015 1. Breast, lumpectomy, Right - INVASIVE DUCTAL CARCINOMA, GRADE 2, SPANNING 1 CM. - DUCTAL CARCINOMA IN SITU, LOW GRADE. - RESECTION MARGINS ARE NEGATIVE FOR INVASIVE CARCINOMA. - DUCTAL CARCINOMA IN SITU COMES TO WITHIN 0.3 CM OF THE POSTERIOR MARGIN. - BIOPSY SITE. - SEE ONCOLOGY TABLE. 2. Lymph node, sentinel, biopsy, right axillary #1 - ONE OF ONE LYMPH NODES NEGATIVE FOR CARCINOMA (0/1). 3. Lymph node, sentinel, biopsy, right axillary #2 - ONE OF ONE LYMPH NODES NEGATIVE FOR CARCINOMA (0/1). 4. Lymph node, sentinel, biopsy, right axillary #3 - ONE OF ONE LYMPH NODES NEGATIVE FOR CARCINOMA (0/1). Microscopic Comment 1. BREAST, INVASIVE TUMOR, WITH LYMPH NODES PRESENT Specimen, including laterality and lymph node sampling (sentinel, non-sentinel): Right breast lump and right axillary sentinel lymph nodes. Procedure: Right breast lumpectomy and right axillary sentinel lymph node  biopsy. Histologic type: Invasive ductal carcinoma. Grade: 2. Tubule formation: 3 Nuclear pleomorphism: 2 Mitotic:1 Tumor size (gross measurement): 1 cm Margins: Invasive, distance to closest margin: 0.2 cm (posterior). In-situ, distance to closest margin: 0.3 cm (posterior, focal). If margin positive, focally or broadly: N/A. Lymphovascular invasion: Not identified. Ductal carcinoma in situ: Present. Grade: I Extensive intraductal component: No. Lobular neoplasia: Not identified. Tumor focality: Unifocal. Treatment effect: N/A. Extent of tumor: Confined to breast parenchyma. Lymph nodes: Examined: 3 Sentinel 0 Non-sentinel 3 Total Lymph nodes with metastasis: 0 Isolated tumor cells (< 0.2 mm): 0 Micrometastasis: (> 0.2 mm and < 2.0 mm): 0 Macrometastasis: (> 2.0 mm): 0 Extracapsular extension: N/A. Breast prognostic profile: Performed on biopsy ([id number] below. Her2 will be repeated on the current specimen. Estrogen receptor: Positive, 90%, strong staining intensity. Progesterone receptor: Positive, 10%, strong staining intensity. Her 2 neu: Negative. Ki-67: 10%. Non-neoplastic breast: Fibrocystic change and usual ductal hyperplasia. Biopsy site. TNM: pT1b, pN0 Comments: None  ONCOTYPE DX: RS 22, which predicts a year risk of distant recurrence with tamoxifen alone 14%  Results: IMMUNOHISTOCHEMICAL AND MORPHOMETRIC ANALYSIS BY THE AUTOMATED CELLULAR IMAGING SYSTEM (ACIS) Estrogen Receptor: 90%, POSITIVE, STRONG STAINING INTENSITY (PERFORMED MANUALLY) Progesterone Receptor: 10%, POSITIVE, STRONG STAINING INTENSITY (PERFORMED MANUALLY) Proliferation Marker Ki67: 10% 1. FLUORESCENCE IN-SITU HYBRIDIZATION Results: HER2 - NEGATIVE RATIO OF HER2/CEP17 SIGNALS 1.29 AVERAGE HER2 COPY NUMBER PER CELL 1.80  RADIOLOGY STUDIES   Abdomen MRI w wo contrast 05/29/2015  IMPRESSION: 1. Mild degradation secondary to patient body habitus. 2. Isolated segment 6 subcapsular  liver lesion  is similar. No new or progressive disease identified. 3. Interpolar right renal lesion is favored to represent a complex/septated cyst and is not significantly changed in size.  CT guided tissue ablation by Dr. Earleen Newport 08/24/2015 IMPRESSION: Status post image guided microwave ablation of solitary metastasis in the right liver lobe in this patient with oligo metastatic colorectal cancer.   CT CAP with contrast 06/17/2016 IMPRESSION: 1. Several new small pulmonary nodules worrisome for metastatic disease. 2. New large area of probable infiltrating recurrent tumor in the right hepatic lobe surrounding the prior ablation site. 3. Small supraclavicular lymph nodes.  Attention on future studies. 4. Stable surgical changes involving both breasts. No breast masses are identified. 5. Stable bilateral adrenal gland nodules. 6. Stable upper abdominal and right-sided retroperitoneal lymph nodes.Marland Kitchen  PET 07/08/2016 IMPRESSION: 1. Large mass in the right lobe of the liver demonstrates diffuse hypermetabolism, compatible with recurrent metastatic disease in the liver. 2. Multiple small pulmonary nodules are very similar to the recent chest CT 06/17/2016. These do not demonstrate hypermetabolism on the PET images, however, these lesions are well below the level of PET imaging. Given that these nodules are new compared to prior chest CT 12/18/2015, they are concerning for potential metastatic disease to the lungs, and continued attention on followup studies is recommended. 3. No other findings of metastatic disease elsewhere in the neck, chest, abdomen or pelvis. 4. There is some low-level hypermetabolic activity adjacent to the surgical clips in the lateral aspect of the right breast at the site of prior lumpectomy. This area has undergone interval involution over the several prior examinations, and this low-level activity is favored to be related to normal healing. Attention on  follow-up imaging is recommended to ensure the stability or resolution of this finding. 5. Aortic atherosclerosis, in addition to 2 vessel coronary artery disease. Please note that although the presence of coronary artery calcium documents the presence of coronary artery disease, the severity of this disease and any potential stenosis cannot be assessed on this non-gated CT examination. Assessment for potential risk factor modification, dietary therapy or pharmacologic therapy may be warranted, if clinically indicated. 6. Additional incidental findings, as above.   ASSESSMENT: Veronica Reyes 60 y.o. female with a history of   1. Colon Cancer, Stage I (pT2N0), MMR normal, oligo recurrent disease in liver in 12/2014, KRAS mutation (+), liver recurrence in 06/2016 -I previously reviewed the nature history of metastatic colon cancer, and the potential curative approach with chemotherapy followed by liver lesion ablation or resection. However, more likely, this is not curable disease. -She received 3 cycles of [place]ox, does not want to proceed with fourth cycle due to the moderate side effects from previous chemotherapy treatment. She declined liver mass resection, and underwent liver ablation by interventional radiologist Dr. Earleen Newport in 08/2015. - I previously reviewed her restaging CT scan from 06/17/2016, which unfortunately showed new area of infiltrative disease as the previous ablated liver lesion, likely recurrence. She also developed several new pulmonary nodules, small, concerning for metastatic disease. -Her CEA level previously increased significantly, most consistent with cancer progression. Last CEA level 48.0 on 05/28/2016 - I reviewed her PET scan from 07/08/2016 which unfortunately showed a large mass in the right lobe of the liver, consistent with recurrent metastatic disease. Giving her prior liver metastasis, significantly elevated CEA, as syncope distant likely from colon cancer.  Metastatic breast cancer is much less likely but not ruled out. A biopsy of this mass will be scheduled.  -We discussed chemotherapy options  again, she did not like oxaliplatin due to the cold sensitivity in the past, I recommend her to consider FOLFIRI and avatin, and the options of single agent irinitecan, xeloda, etc, were discussed with her also. Due to her MSI stable disease, she is not a candidate for immunotherapy outside a clinical trial setting. She has K-ras mutation, not a candidate for EGFR inhibitor.  --Chemotherapy consent: Side effects including but does not not limited to, fatigue, nausea, vomiting, diarrhea, hair loss, neuropathy, fluid retention, renal and kidney dysfunction, neutropenic fever, needed for blood transfusion, bleeding, coronary artery spasm and heart attack, hypertension, bowel perforation, proteinuria, bleeding and thrombosis, were discussed with patient in great detail. She agrees to proceed. -The goal of therapy is palliative. -We discussed that if she has excellent response to chemotherapy, we may consider local therapy to her liver metastasis, such as resection or liver targeted embolization. -After a lengthy discussion, patient is more receptive to chemotherapy. she will think about it and speak with her husband. She was given printed information about the chemotherapy. She will contact our office by the end of the week with how she would like to proceed   2. Right breast cancer, pT1bN0M0, stage Ia, ER+/PR+/HER2-, History of left Breast Cancer, diagnosed on 02/01/2015 --Left CIS in situ, s/p lumpectomy in 2006 and 2011 by Dr. Margot Chimes, and radiation in 2011 by Dr. Sondra Come.  -I reviewed her surgical pathology results from 11/10/2015, which showed a 1 cm grade 2 invasive ductal carcinoma and DCIS. Surgical margins were negative. -I reviewed her Oncotype DX result. Her recurrence score is 22, which predicts 14% 10 year risk of distant recurrence with tamoxifen alone. This is  intermediate risk, based on her relatively low number, I do not recommend adjuvant chemotherapy. -She has completed adjuvant breast radiation -She has not had menstrual period for 2 years until one months ago she had one episode of vaginal bleeding. -her Moorcroft level supports she is postmenopausal  -She has started adjuvant letrozole, we'll continue. -We also discussed breast cancer surveillance, I strongly encouraged her to continue annual screening mammogram, self exam, and follow-up with [place] routinely.  3. History of PE (01/06/2014) --She has a diagnosis of pulmonary embolism, likely secondary to malignancy in the setting of family history for stroke (and possible stroke history in patient as well). Lower extremity dopplers negative.  -continue Xarelto. Patient asked about if she can come off Xarelto. If she has no evidence of cancer in 6 months, I can potentially take her off Xarelto. She understands she has moderate to high risk of recurrent thrombosis if she is off anticoagulation.  4.  Uterine Fibroids  --Negative endometrial biopsy. Following with Gynecology.  5. Kidney lesion (Right). -No hypermetabolic right kidney lesion on the pet scan -Repeat abdominal MRI 05/29/2015 showed unchanged right renal lesion, favored to represent a complex/septated cyst.  6. Right flank pain - possible related to her liver metastasis -She'll continue Tylenol No. 4 as needed  7.  Morbid obesity - I encouraged her to eat healthy and exercise more. - I encouraged her to participate the " living well"  Program in our cancer center,  Which is designed for breast cancer survivor to improve your diet and exercise.  Plan  -She will be set up for liver mass biopsy and port placement by IR -If she decides on chemotherapy, then I will set her up for port placement on the same day as her biopsy -Refilled her Tylenol No. 4 today -I'll tentatively schedule her to  start chemotherapy FOLFRRI and Avastin on 07/24/2016, I  will see her before chemo  All questions were answered. The patient knows to call the clinic with any problems, questions or concerns. We can certainly see the patient much sooner if necessary.  I spent 25 minutes counseling the patient face to face. The total time spent in the appointment was 30 minutes.  This document serves as a record of services personally performed by [name], MD. It was created on her behalf by Arlyce Harman, a trained medical scribe. The creation of this record is based on the scribe's personal observations and the provider's statements to them. This document has been checked and approved by the attending provider.  [name]  07/15/2016

## 2016-07-15 NOTE — Telephone Encounter (Signed)
Message sent to chemo scheduler to add chemo. Lab and  Follow up appointment scheduled per 07/15/16 los. AVS report and appointment schedule given to patient, per 07/15/16 los.

## 2016-07-16 ENCOUNTER — Telehealth: Payer: Self-pay | Admitting: *Deleted

## 2016-07-16 NOTE — Telephone Encounter (Signed)
Per LOS I have scheduled appts and notified the scheduler 

## 2016-07-23 ENCOUNTER — Encounter: Payer: Self-pay | Admitting: Pharmacist

## 2016-07-24 ENCOUNTER — Encounter: Payer: BLUE CROSS/BLUE SHIELD | Admitting: Hematology

## 2016-07-24 ENCOUNTER — Telehealth: Payer: Self-pay | Admitting: *Deleted

## 2016-07-24 ENCOUNTER — Other Ambulatory Visit: Payer: BLUE CROSS/BLUE SHIELD

## 2016-07-24 ENCOUNTER — Other Ambulatory Visit: Payer: Self-pay | Admitting: Hematology

## 2016-07-24 ENCOUNTER — Ambulatory Visit: Payer: BLUE CROSS/BLUE SHIELD

## 2016-07-24 ENCOUNTER — Telehealth: Payer: Self-pay | Admitting: Hematology

## 2016-07-24 DIAGNOSIS — C187 Malignant neoplasm of sigmoid colon: Secondary | ICD-10-CM

## 2016-07-24 MED ORDER — PROCHLORPERAZINE MALEATE 10 MG PO TABS: 10.0000 mg | ORAL_TABLET | Freq: Four times a day (QID) | ORAL | 1 refills | Status: DC | PRN

## 2016-07-24 MED ORDER — ONDANSETRON HCL 8 MG PO TABS: 8.0000 mg | ORAL_TABLET | Freq: Two times a day (BID) | ORAL | 1 refills | Status: AC | PRN

## 2016-07-24 MED ORDER — LIDOCAINE-PRILOCAINE 2.5-2.5 % EX CREA: 1.0000 "application " | TOPICAL_CREAM | CUTANEOUS | 2 refills | Status: DC | PRN

## 2016-07-24 NOTE — Telephone Encounter (Signed)
Pt is scheduled for liver mass biopsy and port placement by IR at [facility] on 07/29/16, OK to hold on 11/13 and 11/14, for biopsy, and she will resume on 11/15.   [name]  07/24/2016

## 2016-07-24 NOTE — Telephone Encounter (Signed)
Discussed xarelto with Dr Burr Medico & she states pt should hold this for 1-2 days before procedure.  Notified Tiffany in IR & asked if bx & port could be done the same day per Dr Burr Medico.  Tiffany reports the earliest she could get pt in for the port is Monday & the bx will go for review & they usually don't do in the same day.  MD wants done ASAP.

## 2016-07-24 NOTE — Telephone Encounter (Signed)
Received call from [facility] in [place] @ [phone] stating order received for port & pt is on xarelto & is asking if this needs to be held & states order needs to be placed in EPIC.  Message to Dr Burr Medico.

## 2016-07-25 ENCOUNTER — Telehealth: Payer: Self-pay | Admitting: *Deleted

## 2016-07-25 NOTE — Telephone Encounter (Signed)
Spoke with patient.  Let her know that she needs to hold her xarelto Sunday night 11/12 and Monday night 11/13 in view of her procedure on 11/14.  She can then resume after that.

## 2016-07-27 ENCOUNTER — Other Ambulatory Visit: Payer: Self-pay | Admitting: Radiology

## 2016-07-27 NOTE — Progress Notes (Signed)
This encounter was created in error - please disregard.

## 2016-07-28 ENCOUNTER — Telehealth: Payer: Self-pay | Admitting: Hematology

## 2016-07-28 NOTE — Telephone Encounter (Signed)
Added appointments for 11/15 and 11/17 per 11/9 sch msg. Not able to reach patient or spouse at any of the listed number. Will call again.

## 2016-07-28 NOTE — Telephone Encounter (Signed)
Spoke with patient's husband re appointments for 11/15. Patient to get new schedule when she comes in 11/15.

## 2016-07-29 ENCOUNTER — Encounter (HOSPITAL_COMMUNITY): Payer: Self-pay

## 2016-07-29 ENCOUNTER — Other Ambulatory Visit: Payer: Self-pay | Admitting: Hematology

## 2016-07-29 ENCOUNTER — Ambulatory Visit (HOSPITAL_COMMUNITY)
Admission: RE | Admit: 2016-07-29 | Discharge: 2016-07-29 | Disposition: A | Discharge status: Home / Self Care | Payer: BLUE CROSS/BLUE SHIELD | Source: Ambulatory Visit | Attending: Interventional Radiology | Admitting: Interventional Radiology

## 2016-07-29 ENCOUNTER — Telehealth: Payer: Self-pay | Admitting: *Deleted

## 2016-07-29 ENCOUNTER — Ambulatory Visit (HOSPITAL_COMMUNITY)
Admission: RE | Admit: 2016-07-29 | Discharge: 2016-07-29 | Disposition: A | Discharge status: Home / Self Care | Payer: BLUE CROSS/BLUE SHIELD | Source: Ambulatory Visit | Attending: Hematology | Admitting: Hematology

## 2016-07-29 DIAGNOSIS — Z853 Personal history of malignant neoplasm of breast: Secondary | ICD-10-CM | POA: Insufficient documentation

## 2016-07-29 DIAGNOSIS — C187 Malignant neoplasm of sigmoid colon: Secondary | ICD-10-CM

## 2016-07-29 DIAGNOSIS — Z85038 Personal history of other malignant neoplasm of large intestine: Secondary | ICD-10-CM | POA: Insufficient documentation

## 2016-07-29 DIAGNOSIS — Z79899 Other long term (current) drug therapy: Secondary | ICD-10-CM | POA: Diagnosis not present

## 2016-07-29 DIAGNOSIS — I10 Essential (primary) hypertension: Secondary | ICD-10-CM | POA: Diagnosis not present

## 2016-07-29 DIAGNOSIS — R918 Other nonspecific abnormal finding of lung field: Secondary | ICD-10-CM | POA: Diagnosis not present

## 2016-07-29 DIAGNOSIS — Z7901 Long term (current) use of anticoagulants: Secondary | ICD-10-CM | POA: Insufficient documentation

## 2016-07-29 DIAGNOSIS — Z923 Personal history of irradiation: Secondary | ICD-10-CM | POA: Insufficient documentation

## 2016-07-29 DIAGNOSIS — C787 Secondary malignant neoplasm of liver and intrahepatic bile duct: Secondary | ICD-10-CM | POA: Insufficient documentation

## 2016-07-29 DIAGNOSIS — Z79811 Long term (current) use of aromatase inhibitors: Secondary | ICD-10-CM | POA: Diagnosis not present

## 2016-07-29 DIAGNOSIS — Z9221 Personal history of antineoplastic chemotherapy: Secondary | ICD-10-CM | POA: Insufficient documentation

## 2016-07-29 DIAGNOSIS — C189 Malignant neoplasm of colon, unspecified: Secondary | ICD-10-CM | POA: Diagnosis present

## 2016-07-29 DIAGNOSIS — Z86711 Personal history of pulmonary embolism: Secondary | ICD-10-CM | POA: Insufficient documentation

## 2016-07-29 HISTORY — PX: IR GENERIC HISTORICAL: IMG1180011

## 2016-07-29 LAB — CBC WITH DIFFERENTIAL/PLATELET
BASOS PCT: 0 %
Basophils Absolute: 0 10*3/uL (ref 0.0–0.1)
Eosinophils Absolute: 0.1 10*3/uL (ref 0.0–0.7)
Eosinophils Relative: 1 %
HEMATOCRIT: 40 % (ref 36.0–46.0)
HEMOGLOBIN: 13.4 g/dL (ref 12.0–15.0)
LYMPHS ABS: 1.7 10*3/uL (ref 0.7–4.0)
LYMPHS PCT: 17 %
MCH: 31.2 pg (ref 26.0–34.0)
MCHC: 33.5 g/dL (ref 30.0–36.0)
MCV: 93.2 fL (ref 78.0–100.0)
MONOS PCT: 7 %
Monocytes Absolute: 0.7 10*3/uL (ref 0.1–1.0)
NEUTROS ABS: 7.6 10*3/uL (ref 1.7–7.7)
NEUTROS PCT: 75 %
Platelets: 332 10*3/uL (ref 150–400)
RBC: 4.29 MIL/uL (ref 3.87–5.11)
RDW: 14 % (ref 11.5–15.5)
WBC: 10.1 10*3/uL (ref 4.0–10.5)

## 2016-07-29 LAB — COMPREHENSIVE METABOLIC PANEL
ALT: 27 U/L (ref 14–54)
AST: 42 U/L — AB (ref 15–41)
Albumin: 3.7 g/dL (ref 3.5–5.0)
Alkaline Phosphatase: 61 U/L (ref 38–126)
Anion gap: 8 (ref 5–15)
BUN: 11 mg/dL (ref 6–20)
CHLORIDE: 101 mmol/L (ref 101–111)
CO2: 29 mmol/L (ref 22–32)
CREATININE: 0.77 mg/dL (ref 0.44–1.00)
Calcium: 9.7 mg/dL (ref 8.9–10.3)
GFR calc Af Amer: >60 mL/min (ref >60)
Glucose, Bld: 117 mg/dL — ABNORMAL HIGH (ref 65–99)
Potassium: 3.6 mmol/L (ref 3.5–5.1)
Sodium: 138 mmol/L (ref 135–145)
Total Bilirubin: 0.6 mg/dL (ref 0.3–1.2)
Total Protein: 8.6 g/dL — ABNORMAL HIGH (ref 6.5–8.1)

## 2016-07-29 LAB — PROTIME-INR
INR: 1.11
Prothrombin Time: 14.4 seconds (ref 11.4–15.2)

## 2016-07-29 MED ORDER — GELATIN ABSORBABLE 12-7 MM EX MISC
CUTANEOUS | Status: AC
  Filled 2016-07-29: qty 1

## 2016-07-29 MED ORDER — FENTANYL CITRATE (PF) 100 MCG/2ML IJ SOLN
INTRAMUSCULAR | Status: AC
Start: 2016-07-29 — End: 2016-07-29
  Filled 2016-07-29: qty 6

## 2016-07-29 MED ORDER — FENTANYL CITRATE (PF) 100 MCG/2ML IJ SOLN
INTRAMUSCULAR | Status: AC | PRN
  Administered 2016-07-29 (×3): 50 ug via INTRAVENOUS

## 2016-07-29 MED ORDER — LIDOCAINE-EPINEPHRINE (PF) 2 %-1:200000 IJ SOLN
INTRAMUSCULAR | Status: AC | PRN
  Administered 2016-07-29: 10 mL via INTRADERMAL

## 2016-07-29 MED ORDER — MIDAZOLAM HCL 2 MG/2ML IJ SOLN
INTRAMUSCULAR | Status: AC | PRN
  Administered 2016-07-29 (×2): 1 mg via INTRAVENOUS

## 2016-07-29 MED ORDER — HEPARIN SOD (PORK) LOCK FLUSH 100 UNIT/ML IV SOLN
INTRAVENOUS | Status: AC
  Administered 2016-07-29: 500 [IU]
  Filled 2016-07-29: qty 5

## 2016-07-29 MED ORDER — FENTANYL CITRATE (PF) 100 MCG/2ML IJ SOLN
INTRAMUSCULAR | Status: AC | PRN
  Administered 2016-07-29: 50 ug via INTRAVENOUS

## 2016-07-29 MED ORDER — SODIUM CHLORIDE 0.9 % IV SOLN
INTRAVENOUS | Status: DC
  Administered 2016-07-29: 10:00:00 via INTRAVENOUS

## 2016-07-29 MED ORDER — DEXTROSE 5 % IV SOLN
3.0000 g | INTRAVENOUS | Status: AC
  Administered 2016-07-29: 3 g via INTRAVENOUS
  Filled 2016-07-29: qty 3

## 2016-07-29 MED ORDER — MIDAZOLAM HCL 2 MG/2ML IJ SOLN
INTRAMUSCULAR | Status: AC
  Filled 2016-07-29: qty 6

## 2016-07-29 MED ORDER — MIDAZOLAM HCL 2 MG/2ML IJ SOLN
INTRAMUSCULAR | Status: AC | PRN
  Administered 2016-07-29 (×4): 1 mg via INTRAVENOUS

## 2016-07-29 MED ORDER — LIDOCAINE-EPINEPHRINE (PF) 2 %-1:200000 IJ SOLN
INTRAMUSCULAR | Status: AC
  Filled 2016-07-29: qty 20

## 2016-07-29 NOTE — Sedation Documentation (Signed)
Patient is resting comfortably. 

## 2016-07-29 NOTE — Discharge Instructions (Signed)
Moderate Conscious Sedation, Adult, Care After These instructions provide you with information about caring for yourself after your procedure. Your health care provider may also give you more specific instructions. Your treatment has been planned according to current medical practices, but problems sometimes occur. Call your health care provider if you have any problems or questions after your procedure. What can I expect after the procedure? After your procedure, it is common:  To feel sleepy for several hours.  To feel clumsy and have poor balance for several hours.  To have poor judgment for several hours.  To vomit if you eat too soon. Follow these instructions at home: For at least 24 hours after the procedure:   Do not:  Participate in activities where you could fall or become injured.  Drive.  Use heavy machinery.  Drink alcohol.  Take sleeping pills or medicines that cause drowsiness.  Make important decisions or sign legal documents.  Take care of children on your own.  Rest. Eating and drinking  Follow the diet recommended by your health care provider.  If you vomit:  Drink water, juice, or soup when you can drink without vomiting.  Make sure you have little or no nausea before eating solid foods. General instructions  Have a responsible adult stay with you until you are awake and alert.  Take over-the-counter and prescription medicines only as told by your health care provider.  If you smoke, do not smoke without supervision.  Keep all follow-up visits as told by your health care provider. This is important. Contact a health care provider if:  You keep feeling nauseous or you keep vomiting.  You feel light-headed.  You develop a rash.  You have a fever. Get help right away if:  You have trouble breathing. This information is not intended to replace advice given to you by your health care provider. Make sure you discuss any questions you have  with your health care provider. Document Released: 06/22/2013 Document Revised: 02/04/2016 Document Reviewed: 12/22/2015 Elsevier Interactive Patient Education  2017 [employer].   Liver Biopsy, Care After Introduction These instructions give you information on caring for yourself after your procedure. Your doctor may also give you more specific instructions. Call your doctor if you have any problems or questions after your procedure. Follow these instructions at home:  Rest at home for 1-2 days or as told by your doctor.  Have someone stay with you for at least 24 hours.  Do not do these things in the first 24 hours:  Drive.  Use machinery.  Take care of other people.  Sign legal documents.  Take a bath or shower.  There are many different ways to close and cover a cut (incision). For example, a cut can be closed with stitches, skin glue, or adhesive strips. Follow your doctor's instructions on:  Taking care of your cut.  Changing and removing your bandage (dressing).  Removing whatever was used to close your cut.  Do not drink alcohol in the first week.  Do not lift more than 5 pounds or play contact sports for the first 2 weeks.  Take medicines only as told by your doctor. For 1 week, do not take medicine that has aspirin in it or medicines like ibuprofen.  Get your test results. Contact a doctor if:  A cut bleeds and leaves more than just a small spot of blood.  A cut is red, puffs up (swells), or hurts more than before.  Fluid or something else  comes from a cut.  A cut smells bad.  You have a fever or chills. Get help right away if:  You have swelling, bloating, or pain in your belly (abdomen).  You get dizzy or faint.  You have a rash.  You feel sick to your stomach (nauseous) or throw up (vomit).  You have trouble breathing, feel short of breath, or feel faint.  Your chest hurts.  You have problems talking or seeing.  You have trouble  balancing or moving your arms or legs. This information is not intended to replace advice given to you by your health care provider. Make sure you discuss any questions you have with your health care provider. Document Released: 06/10/2008 Document Revised: 02/07/2016 Document Reviewed: 10/28/2013  2017 Elsevier   Implanted Port Insertion, Care After Refer to this sheet in the next few weeks. These instructions provide you with information on caring for yourself after your procedure. Your health care provider may also give you more specific instructions. Your treatment has been planned according to current medical practices, but problems sometimes occur. Call your health care provider if you have any problems or questions after your procedure. WHAT TO EXPECT AFTER THE PROCEDURE After your procedure, it is typical to have the following:   Discomfort at the port insertion site. Ice packs to the area will help.  Bruising on the skin over the port. This will subside in 3-4 days. HOME CARE INSTRUCTIONS  After your port is placed, you will get a manufacturer's information card. The card has information about your port. Keep this card with you at all times.   Know what kind of port you have. There are many types of ports available.   Wear a medical alert bracelet in case of an emergency. This can help alert health care workers that you have a port.   The port can stay in for as long as your health care provider believes it is necessary.   A home health care nurse may give medicines and take care of the port.   You or a family member can get special training and directions for giving medicine and taking care of the port at home.  SEEK MEDICAL CARE IF:   Your port does not flush or you are unable to get a blood return.   You have a fever or chills. SEEK IMMEDIATE MEDICAL CARE IF:  You have new fluid or pus coming from your incision.   You notice a bad smell coming from your  incision site.   You have swelling, pain, or more redness at the incision or port site.   You have chest pain or shortness of breath. This information is not intended to replace advice given to you by your health care provider. Make sure you discuss any questions you have with your health care provider. Document Released: 06/22/2013 Document Revised: 09/06/2013 Document Reviewed: 06/22/2013 Elsevier Interactive Patient Education  2017 [employer].

## 2016-07-29 NOTE — Discharge Instructions (Signed)
Liver Biopsy, Care After Introduction Refer to this sheet in the next few weeks. These instructions provide you with information on caring for yourself after your procedure. Your health care provider may also give you more specific instructions. Your treatment has been planned according to current medical practices, but problems sometimes occur. Call your health care provider if you have any problems or questions after your procedure. What can I expect after the procedure? After your procedure, it is typical to have the following:  A small amount of discomfort in the area where the biopsy was done and in the right shoulder or shoulder blade.  A small amount of bruising around the area where the biopsy was done and on the skin over the liver.  Sleepiness and fatigue for the rest of the day. Follow these instructions at home:  Rest at home for 1-2 days or as directed by your health care provider.  Have a friend or family member stay with you for at least 24 hours.  Because of the medicines used during the procedure, you should not do the following things in the first 24 hours:  Drive.  Use machinery.  Be responsible for the care of other people.  Sign legal documents.  Take a bath or shower.  There are many different ways to close and cover an incision, including stitches, skin glue, and adhesive strips. Follow your health care provider's instructions on:  Incision care.  Bandage (dressing) changes and removal.  Incision closure removal.  Do not drink alcohol in the first week.  Do not lift more than 5 pounds or play contact sports for 2 weeks after this test.  Take medicines only as directed by your health care provider. Do not take medicine containing aspirin or non-steroidal anti-inflammatory medicines such as ibuprofen for 1 week after this test.  It is your responsibility to get your test results. Contact a health care provider if:  You have increased bleeding from an  incision that results in more than a small spot of blood.  You have redness, swelling, or increasing pain in any incisions.  You notice a discharge or a bad smell coming from any of your incisions.  You have a fever or chills. Get help right away if:  You develop swelling, bloating, or pain in your abdomen.  You become dizzy or faint.  You develop a rash.  You are nauseous or vomit.  You have difficulty breathing, feel short of breath, or feel faint.  You develop chest pain.  You have problems with your speech or vision.  You have trouble balancing or moving your arms or legs. This information is not intended to replace advice given to you by your health care provider. Make sure you discuss any questions you have with your health care provider. Document Released: 03/21/2005 Document Revised: 02/07/2016 Document Reviewed: 10/28/2013  2017 Else[employer] Implanted Port Insertion, Care After Refer to this sheet in the next few weeks. These instructions provide you with information on caring for yourself after your procedure. Your health care provider may also give you more specific instructions. Your treatment has been planned according to current medical practices, but problems sometimes occur. Call your health care provider if you have any problems or questions after your procedure. WHAT TO EXPECT AFTER THE PROCEDURE After your procedure, it is typical to have the following:   Discomfort at the port insertion site. Ice packs to the area will help.  Bruising on the skin over the port. This will subside  in 3-4 days. HOME CARE INSTRUCTIONS  After your port is placed, you will get a manufacturer's information card. The card has information about your port. Keep this card with you at all times.   Know what kind of port you have. There are many types of ports available.   Wear a medical alert bracelet in case of an emergency. This can help alert health care workers that you have a  port.   The port can stay in for as long as your health care provider believes it is necessary.   A home health care nurse may give medicines and take care of the port.   You or a family member can get special training and directions for giving medicine and taking care of the port at home.  SEEK MEDICAL CARE IF:   Your port does not flush or you are unable to get a blood return.   You have a fever or chills. SEEK IMMEDIATE MEDICAL CARE IF:  You have new fluid or pus coming from your incision.   You notice a bad smell coming from your incision site.   You have swelling, pain, or more redness at the incision or port site.   You have chest pain or shortness of breath. This information is not intended to replace advice given to you by your health care provider. Make sure you discuss any questions you have with your health care provider. Document Released: 06/22/2013 Document Revised: 09/06/2013 Document Reviewed: 06/22/2013 Elsevier Interactive Patient Education  2017 [employer]. Moderate Conscious Sedation, Adult, Care After These instructions provide you with information about caring for yourself after your procedure. Your health care provider may also give you more specific instructions. Your treatment has been planned according to current medical practices, but problems sometimes occur. Call your health care provider if you have any problems or questions after your procedure. What can I expect after the procedure? After your procedure, it is common:  To feel sleepy for several hours.  To feel clumsy and have poor balance for several hours.  To have poor judgment for several hours.  To vomit if you eat too soon. Follow these instructions at home: For at least 24 hours after the procedure:   Do not:  Participate in activities where you could fall or become injured.  Drive.  Use heavy machinery.  Drink alcohol.  Take sleeping pills or medicines that cause  drowsiness.  Make important decisions or sign legal documents.  Take care of children on your own.  Rest. Eating and drinking  Follow the diet recommended by your health care provider.  If you vomit:  Drink water, juice, or soup when you can drink without vomiting.  Make sure you have little or no nausea before eating solid foods. General instructions  Have a responsible adult stay with you until you are awake and alert.  Take over-the-counter and prescription medicines only as told by your health care provider.  If you smoke, do not smoke without supervision.  Keep all follow-up visits as told by your health care provider. This is important. Contact a health care provider if:  You keep feeling nauseous or you keep vomiting.  You feel light-headed.  You develop a rash.  You have a fever. Get help right away if:  You have trouble breathing. This information is not intended to replace advice given to you by your health care provider. Make sure you discuss any questions you have with your health care provider. Document Released: 06/22/2013  Document Revised: 02/04/2016 Document Reviewed: 12/22/2015 Elsevier Interactive Patient Education  2017 [employer].

## 2016-07-29 NOTE — H&P (Signed)
Referring Physician(s): Feng,Yan  Supervising Physician: Sandi Mariscal  Patient Status: [facility]  Chief Complaint:  "I'm having a biopsy and port placement"  Subjective: Pt familiar to IR service from prior liver lesion biopsy in April 2016 as well as microwave ablation of right hepatic lobe lesion in December 2016. She has a known history of left breast DCIS diagnosed in 2006 and 2010, status post lumpectomy in addition to colon cancer in 2015 with partial colectomy/chemotherapy as well as PE. She has also had right breast cancer diagnosed in 2016 with lumpectomy/adjuvant breast radiation. Recent PET scan on 07/08/16  revealed a large mass in the right hepatic lobe with diffuse hypermetabolism as well as multiple small pulmonary nodules. She presents again today for US guided liver lesion biopsy and port a cath placement. She denies fever,HA, CP, dyspnea, cough, abd pain, N/V or bleeding. She does have some rt flank /back discomfort.  Past Medical History:  Diagnosis Date  . Anxiety   . Breast CA ([place])    radiation and surgery-lt.  . Cancer ([place])    left breast cancer x2  . Colon cancer ([place])    Dr. Burr Medico seeing monthly  . Hypertension   . Liver lesion   . Pulmonary embolism ([place])    "blood clot in lungs" -dx. 4'15 with CT Chest. On Xarelto  . Radiation 01/22/10-03/12/10   left whole breast 4500 cGy, upper aspect boosted to 6120 cGy  . Radiation 02/21/16 - 04/10/16   right breast 50.4 Gy, right breast boost 12 Gy  . Shortness of breath    sob with exertion. dx. with Pulmonary emboli 4'15 ,tx. Xarelto,Lovenox used for [employer].   Past Surgical History:  Procedure Laterality Date  . BREAST LUMPECTOMY WITH RADIOACTIVE SEED AND SENTINEL LYMPH NODE BIOPSY Right 11/07/2015   Procedure: BREAST LUMPECTOMY WITH RADIOACTIVE SEED AND SENTINEL LYMPH NODE BIOPSY;  Surgeon: Stark Klein, MD;  Location: [facility];  Service: General;  Laterality: Right;  . BREAST SURGERY     '11- Left  breast lumpectomy  . CESAREAN SECTION    . CHOLECYSTECTOMY     Laparoscopic 20 yrs ago  . COLON SURGERY  03-09-14   partial colectomy for cancer  . PARTIAL COLECTOMY N/A 03/09/2014   Procedure: LAPAROSCOPIC ASSISTED PARTIAL COLECTOMY, splenic flexure mobilization;  Surgeon: Leighton Ruff, MD;  Location[facility];  Service: General;  Laterality: N/A;  . RADIOFREQUENCY ABLATION Right 08/24/2015   Procedure: MICROWAVE ABLATION RIGHT LIVER LOBE ;  Surgeon: Corrie Mckusick, DO;  Location: [facility];  Service: Anesthesiology;  Laterality: Right;      Allergies: Tramadol  Medications: Prior to Admission medications   Medication Sig Start Date End Date Taking? Authorizing Provider  acetaminophen-codeine (TYLENOL #4) 300-60 MG tablet Take 1 tablet by mouth every 8 (eight) hours as needed. for pain 07/15/16  Yes [name], MD  amLODipine (NORVASC) 2.5 MG tablet Take 2.5 mg by mouth daily. 10/23/14  Yes Historical Provider, MD  ferrous sulfate 325 (65 FE) MG tablet Take 325 mg by mouth daily with breakfast.    Yes Historical Provider, MD  hydrochlorothiazide (HYDRODIURIL) 25 MG tablet Take 1 tablet (25 mg total) by mouth every morning. 02/22/16  Yes [name], MD  letrozole [facility]) 2.5 MG tablet Take 1 tablet (2.5 mg total) by mouth daily. 06/02/16  Yes [name], MD  metoprolol (LOPRESSOR) 50 MG tablet Take 1 tablet (50 mg total) by mouth 2 (two) times daily. 02/22/16  Yes [name], MD  potassium chloride SA (K-DUR,KLOR-CON) 20 MEQ tablet Take 1 tablet (20 mEq total) by mouth daily. 01/31/16  Yes [name], MD  lidocaine-prilocaine (EMLA) cream Apply 1 application topically as needed. 07/24/16   [name], MD  ondansetron (ZOFRAN) 8 MG tablet Take 1 tablet (8 mg total) by mouth 2 (two) times daily as needed for refractory nausea / vomiting. Start on day 3 after chemotherapy. 07/24/16   [name], MD  prochlorperazine (COMPAZINE) 10 MG tablet Take 1 tablet (10 mg total) by mouth every 6 (six) hours as needed (NAUSEA).  07/24/16   [name], MD  XARELTO 20 MG TABS tablet Take 1 tablet by mouth daily with supper 01/16/16   [name], MD     Vital Signs: BP 164/87  HR 57 R 18 TEMP 98.3 O2 SATS 100% RA   LMP 01/23/2014   Physical Exam awake/alert; chest- CTA bilat ant; heart- RRR; abd- obese, soft,+BS,NT; ext- no edema  Imaging: No results found.  Labs:  CBC:  Recent Labs  01/31/16 1522 03/27/16 1233 05/28/16 1136 06/27/16 1540  WBC 9.1 10.1 8.5 10.2  HGB 13.9 14.7 13.8 13.6  HCT 40.5 44.1 41.4 40.6  PLT 239 255 275 263    COAGS:  Recent Labs  08/22/15 1445 08/24/15 0720  INR 1.16 1.07  APTT 30  --     BMP:  Recent Labs  08/22/15 1445  11/02/15 1325  01/31/16 1526 03/27/16 1233 05/28/16 1136 06/27/16 1540  NA 140  < > 140  < > 140 141 141 138  K 4.3  < > 4.2  < > 3.5 3.8 3.7 3.8  CL 105  --  103  --   --   --   --   --   CO2 29  < > 28  < > 28 28 28 27   GLUCOSE 101*  < > 94  < > 110 100 85 98  BUN 8  < > 7  < > 8.9 9.5 11.6 15.0  CALCIUM 9.8  < > 9.4  < > 9.6 9.8 9.7 9.6  CREATININE 0.89  < > 0.80  < > 0.9 0.9 0.9 0.9  GFRNONAA >60  --  >60  --   --   --   --   --   GFRAA >60  --  >60  --   --   --   --   --   < > = values in this interval not displayed.  LIVER FUNCTION TESTS:  Recent Labs  01/31/16 1526 03/27/16 1233 05/28/16 1136 06/27/16 1540  BILITOT 0.58 0.77 0.49 0.42  AST 40* 53* 50* 43*  ALT 30 46 38 31  ALKPHOS 46 53 59 64  PROT 8.0 8.4* 8.0 8.4*  ALBUMIN 3.4* 3.5 3.1* 3.1*    Assessment and Plan: Pt familiar to IR service from prior liver lesion biopsy in April 2016 as well as microwave ablation of right hepatic lobe lesion in December 2016. She has a known history of left breast DCIS diagnosed in 2006 and 2010, status post lumpectomy in addition to colon cancer in 2015 with partial colectomy/chemotherapy as well as PE. She has also had right breast cancer diagnosed in 2016 with lumpectomy/adjuvant breast radiation. Recent PET scan on 07/08/16   revealed a large mass in the right hepatic lobe with diffuse hypermetabolism as well as multiple small pulmonary nodules. She presents again today for US guided liver lesion biopsy and port a cath placement. Risks and benefits discussed  with the patient including, but not limited to bleeding, infection, damage to adjacent structures, venous thrombosis or low yield requiring additional tests.All of the patient's questions were answered, patient is agreeable to proceed.Consent signed and in chart. Labs pend.     Electronically Signed: D. Rowe Robert 07/29/2016, 10:19 AM   I spent a total of 25 minutes at the the patient's bedside AND on the patient's hospital floor or unit, greater than 50% of which was counseling/coordinating care for US guided liver lesion biopsy and port a cath placement

## 2016-07-29 NOTE — Sedation Documentation (Signed)
Remedicated

## 2016-07-29 NOTE — Procedures (Signed)
Technically successful US guided biopsy of indeterminate mass within the right lobe of the liver.    Successful placement of right IJ approach port-a-cath with tip at the superior caval atrial junction. The catheter is ready for immediate use.  Estimated Blood Loss: Minimal  No immediate post procedural complications.  Ronny Bacon, MD Pager #: [phone]

## 2016-07-29 NOTE — Telephone Encounter (Signed)
Received call from pt. @ 220 Pt stated that she needed some stronger pain meds. also something to help her sleep, Pt said she was having trouble sleeping at night. Call was transferred dto vmail/message left.

## 2016-07-29 NOTE — Sedation Documentation (Signed)
Pt still awake, anxious remedicated

## 2016-07-30 ENCOUNTER — Ambulatory Visit: Payer: BLUE CROSS/BLUE SHIELD

## 2016-07-30 ENCOUNTER — Other Ambulatory Visit: Payer: BLUE CROSS/BLUE SHIELD

## 2016-07-30 ENCOUNTER — Telehealth: Payer: Self-pay | Admitting: *Deleted

## 2016-07-30 NOTE — Telephone Encounter (Signed)
"  I had port-a-cath placed yesterday and to start treatment today. My sister was to bring me today but she's in the hospital.  I cannot come in."  Offered S.W. To arrange transportation.   "My neck is killing me.  Taking tylenlol and ibuprofen.  I just need to reschedule."  Call transferred ext [phone].

## 2016-07-30 NOTE — Telephone Encounter (Signed)
Per patient request I have moved appats from today to tomorrow.

## 2016-07-31 ENCOUNTER — Other Ambulatory Visit (HOSPITAL_BASED_OUTPATIENT_CLINIC_OR_DEPARTMENT_OTHER): Payer: BLUE CROSS/BLUE SHIELD

## 2016-07-31 ENCOUNTER — Ambulatory Visit (HOSPITAL_BASED_OUTPATIENT_CLINIC_OR_DEPARTMENT_OTHER): Payer: BLUE CROSS/BLUE SHIELD

## 2016-07-31 VITALS — BP 163/88 | HR 78 | Temp 99.8°F | Resp 18

## 2016-07-31 DIAGNOSIS — C187 Malignant neoplasm of sigmoid colon: Secondary | ICD-10-CM

## 2016-07-31 DIAGNOSIS — C50511 Malignant neoplasm of lower-outer quadrant of right female breast: Secondary | ICD-10-CM | POA: Diagnosis not present

## 2016-07-31 DIAGNOSIS — Z5111 Encounter for antineoplastic chemotherapy: Secondary | ICD-10-CM

## 2016-07-31 LAB — CBC WITH DIFFERENTIAL/PLATELET
BASO%: 0.2 % (ref 0.0–2.0)
BASOS ABS: 0 10*3/uL (ref 0.0–0.1)
EOS%: 0.5 % (ref 0.0–7.0)
Eosinophils Absolute: 0.1 10*3/uL (ref 0.0–0.5)
HEMATOCRIT: 38.5 % (ref 34.8–46.6)
HGB: 13 g/dL (ref 11.6–15.9)
LYMPH#: 2.3 10*3/uL (ref 0.9–3.3)
LYMPH%: 23.1 % (ref 14.0–49.7)
MCH: 31 pg (ref 25.1–34.0)
MCHC: 33.8 g/dL (ref 31.5–36.0)
MCV: 91.7 fL (ref 79.5–101.0)
MONO#: 0.7 10*3/uL (ref 0.1–0.9)
MONO%: 6.9 % (ref 0.0–14.0)
NEUT#: 6.9 10*3/uL — ABNORMAL HIGH (ref 1.5–6.5)
NEUT%: 69.3 % (ref 38.4–76.8)
PLATELETS: 284 10*3/uL (ref 145–400)
RBC: 4.2 10*6/uL (ref 3.70–5.45)
RDW: 13.9 % (ref 11.2–14.5)
WBC: 10 10*3/uL (ref 3.9–10.3)

## 2016-07-31 LAB — COMPREHENSIVE METABOLIC PANEL
ALT: 24 U/L (ref 0–55)
ANION GAP: 12 meq/L — AB (ref 3–11)
AST: 37 U/L — ABNORMAL HIGH (ref 5–34)
Albumin: 3.2 g/dL — ABNORMAL LOW (ref 3.5–5.0)
Alkaline Phosphatase: 75 U/L (ref 40–150)
BUN: 10.4 mg/dL (ref 7.0–26.0)
CALCIUM: 10.4 mg/dL (ref 8.4–10.4)
CHLORIDE: 103 meq/L (ref 98–109)
CO2: 24 mEq/L (ref 22–29)
CREATININE: 0.8 mg/dL (ref 0.6–1.1)
Glucose: 107 mg/dl (ref 70–140)
POTASSIUM: 3.4 meq/L — AB (ref 3.5–5.1)
Sodium: 139 mEq/L (ref 136–145)
Total Bilirubin: 0.54 mg/dL (ref 0.20–1.20)
Total Protein: 8.9 g/dL — ABNORMAL HIGH (ref 6.4–8.3)

## 2016-07-31 LAB — CEA (IN HOUSE-CHCC): CEA (CHCC-IN HOUSE): 183.55 ng/mL — AB (ref 0.00–5.00)

## 2016-07-31 MED ORDER — DEXAMETHASONE SODIUM PHOSPHATE 10 MG/ML IJ SOLN
INTRAMUSCULAR | Status: AC
  Filled 2016-07-31: qty 1

## 2016-07-31 MED ORDER — PALONOSETRON HCL INJECTION 0.25 MG/5ML
0.2500 mg | Freq: Once | INTRAVENOUS | Status: AC
  Administered 2016-07-31: 0.25 mg via INTRAVENOUS

## 2016-07-31 MED ORDER — HYDROCODONE-ACETAMINOPHEN 5-325 MG PO TABS
1.0000 | ORAL_TABLET | Freq: Four times a day (QID) | ORAL | 0 refills | Status: DC | PRN
Start: 2016-07-31 — End: 2017-01-08

## 2016-07-31 MED ORDER — ATROPINE SULFATE 1 MG/ML IJ SOLN
0.5000 mg | Freq: Once | INTRAMUSCULAR | Status: AC | PRN
  Administered 2016-07-31: 0.5 mg via INTRAVENOUS

## 2016-07-31 MED ORDER — IRINOTECAN HCL CHEMO INJECTION 100 MG/5ML
180.0000 mg/m2 | Freq: Once | INTRAVENOUS | Status: AC
  Administered 2016-07-31: 440 mg via INTRAVENOUS
  Filled 2016-07-31: qty 2

## 2016-07-31 MED ORDER — HEPARIN SOD (PORK) LOCK FLUSH 100 UNIT/ML IV SOLN
500.0000 [IU] | Freq: Once | INTRAVENOUS | Status: DC | PRN
  Filled 2016-07-31: qty 5

## 2016-07-31 MED ORDER — DEXAMETHASONE SODIUM PHOSPHATE 10 MG/ML IJ SOLN
10.0000 mg | Freq: Once | INTRAMUSCULAR | Status: AC
  Administered 2016-07-31: 10 mg via INTRAVENOUS

## 2016-07-31 MED ORDER — SODIUM CHLORIDE 0.9 % IV SOLN
Freq: Once | INTRAVENOUS | Status: AC
  Administered 2016-07-31: 13:00:00 via INTRAVENOUS

## 2016-07-31 MED ORDER — ATROPINE SULFATE 1 MG/ML IJ SOLN
INTRAMUSCULAR | Status: AC
  Filled 2016-07-31: qty 1

## 2016-07-31 MED ORDER — LEUCOVORIN CALCIUM INJECTION 350 MG
400.0000 mg/m2 | Freq: Once | INTRAVENOUS | Status: AC
  Administered 2016-07-31: 976 mg via INTRAVENOUS
  Filled 2016-07-31: qty 48.8

## 2016-07-31 MED ORDER — FLUOROURACIL CHEMO INJECTION 2.5 GM/50ML
400.0000 mg/m2 | Freq: Once | INTRAVENOUS | Status: AC
  Administered 2016-07-31: 1000 mg via INTRAVENOUS
  Filled 2016-07-31: qty 20

## 2016-07-31 MED ORDER — ACETAMINOPHEN 325 MG PO TABS
650.0000 mg | ORAL_TABLET | Freq: Once | ORAL | Status: AC
  Administered 2016-07-31: 650 mg via ORAL

## 2016-07-31 MED ORDER — SODIUM CHLORIDE 0.9 % IV SOLN
2400.0000 mg/m2 | INTRAVENOUS | Status: DC
  Administered 2016-07-31: 5850 mg via INTRAVENOUS
  Filled 2016-07-31: qty 117

## 2016-07-31 MED ORDER — ZOLPIDEM TARTRATE 5 MG PO TABS: 5.0000 mg | ORAL_TABLET | Freq: Every evening | ORAL | 0 refills | Status: DC | PRN

## 2016-07-31 MED ORDER — PALONOSETRON HCL INJECTION 0.25 MG/5ML
INTRAVENOUS | Status: AC
  Filled 2016-07-31: qty 5

## 2016-07-31 MED ORDER — ACETAMINOPHEN 325 MG PO TABS
ORAL_TABLET | ORAL | Status: AC
  Filled 2016-07-31: qty 2

## 2016-07-31 MED ORDER — SODIUM CHLORIDE 0.9% FLUSH
10.0000 mL | INTRAVENOUS | Status: DC | PRN
  Filled 2016-07-31: qty 10

## 2016-07-31 NOTE — Progress Notes (Signed)
Per Dr. Burr Medico, increase rate on 5FU pump for pump d/c on Saturday, at 1:00PM.  Rate increased to 5.7 ml/hr.  Pt instructed to return to [facility] on Saturday, 08/02/16 at 1:00PM for pump d/c.

## 2016-07-31 NOTE — Patient Instructions (Signed)
[facility] Discharge Instructions for Patients Receiving Chemotherapy  Today you received the following chemotherapy agents: Irinotecan, Leucovorin, and Adrucil .   To help prevent nausea and vomiting after your treatment, we encourage you to take your nausea medication as prescribed.   If you develop nausea and vomiting that is not controlled by your nausea medication, call the clinic.   BELOW ARE SYMPTOMS THAT SHOULD BE REPORTED IMMEDIATELY:  *FEVER GREATER THAN 100.5 F  *CHILLS WITH OR WITHOUT FEVER  NAUSEA AND VOMITING THAT IS NOT CONTROLLED WITH YOUR NAUSEA MEDICATION  *UNUSUAL SHORTNESS OF BREATH  *UNUSUAL BRUISING OR BLEEDING  TENDERNESS IN MOUTH AND THROAT WITH OR WITHOUT PRESENCE OF ULCERS  *URINARY PROBLEMS  *BOWEL PROBLEMS  UNUSUAL RASH Items with * indicate a potential emergency and should be followed up as soon as possible.  Feel free to call the clinic you have any questions or concerns. The clinic phone number is [phone].  Please show the CHEMO ALERT CARD at check-in to the Emergency Department and triage nurse.   

## 2016-07-31 NOTE — Progress Notes (Signed)
1330- Patient c/o mild backache and requested tylenol. Order obtained and medication administered.  1455- Patient c/o stomach cramping. Gave 0.5mg  of atropine. Patient visited the restroom and reported diarrhea.  1501- Patient states that her cramping is relieved.  1515- BP 162/92 but patient has no complaints at this time.

## 2016-07-31 NOTE — Telephone Encounter (Signed)
Veronica Reyes came in today for first cycle chemotherapy, I saw her briefly in the infusion room in the late afternoon. She can't tolerate treatment well today. She states that her pain has been worse lately, I give her a prescription of hydrocodone/Tylenol. She also has issues with insomnia, I give her a prescription of Ambien today. I discussed her liver biopsy results. I'll see her back in 2 weeks before next cycle chemotherapy. Avastin was held today due to her  [facility] placement yesterday.  [name]  07/31/2016

## 2016-08-01 ENCOUNTER — Other Ambulatory Visit: Payer: Self-pay | Admitting: *Deleted

## 2016-08-01 LAB — CEA: CEA1: 173.7 ng/mL — AB (ref 0.0–4.7)

## 2016-08-02 ENCOUNTER — Ambulatory Visit: Payer: BLUE CROSS/BLUE SHIELD

## 2016-08-02 VITALS — BP 141/95 | HR 69 | Temp 99.6°F | Resp 20

## 2016-08-02 DIAGNOSIS — C187 Malignant neoplasm of sigmoid colon: Secondary | ICD-10-CM

## 2016-08-02 MED ORDER — HEPARIN SOD (PORK) LOCK FLUSH 100 UNIT/ML IV SOLN
500.0000 [IU] | Freq: Once | INTRAVENOUS | Status: AC | PRN
  Administered 2016-08-02: 500 [IU]
  Filled 2016-08-02: qty 5

## 2016-08-02 MED ORDER — SODIUM CHLORIDE 0.9% FLUSH
10.0000 mL | INTRAVENOUS | Status: DC | PRN
  Administered 2016-08-02: 10 mL
  Filled 2016-08-02: qty 10

## 2016-08-04 NOTE — Telephone Encounter (Signed)
Attempted to reach patient to f/u on her status since chemo on 11/16. No answer or machine to leave message.

## 2016-08-05 ENCOUNTER — Telehealth: Payer: Self-pay | Admitting: *Deleted

## 2016-08-05 NOTE — Telephone Encounter (Signed)
I have attempted to call the patient regarding appts. No answer for either numbers listed.

## 2016-08-13 ENCOUNTER — Ambulatory Visit (HOSPITAL_BASED_OUTPATIENT_CLINIC_OR_DEPARTMENT_OTHER): Payer: BLUE CROSS/BLUE SHIELD

## 2016-08-13 ENCOUNTER — Other Ambulatory Visit (HOSPITAL_BASED_OUTPATIENT_CLINIC_OR_DEPARTMENT_OTHER): Payer: BLUE CROSS/BLUE SHIELD

## 2016-08-13 ENCOUNTER — Encounter: Payer: Self-pay | Admitting: *Deleted

## 2016-08-13 ENCOUNTER — Ambulatory Visit (HOSPITAL_BASED_OUTPATIENT_CLINIC_OR_DEPARTMENT_OTHER): Payer: BLUE CROSS/BLUE SHIELD | Admitting: Hematology

## 2016-08-13 ENCOUNTER — Encounter: Payer: Self-pay | Admitting: Hematology

## 2016-08-13 ENCOUNTER — Ambulatory Visit: Payer: BLUE CROSS/BLUE SHIELD

## 2016-08-13 VITALS — BP 108/70

## 2016-08-13 VITALS — BP 119/80 | HR 70 | Temp 98.6°F | Resp 18 | Ht 66.0 in | Wt 277.3 lb

## 2016-08-13 DIAGNOSIS — Z95828 Presence of other vascular implants and grafts: Secondary | ICD-10-CM | POA: Insufficient documentation

## 2016-08-13 DIAGNOSIS — C187 Malignant neoplasm of sigmoid colon: Secondary | ICD-10-CM | POA: Diagnosis not present

## 2016-08-13 DIAGNOSIS — Z5111 Encounter for antineoplastic chemotherapy: Secondary | ICD-10-CM | POA: Diagnosis not present

## 2016-08-13 DIAGNOSIS — C50511 Malignant neoplasm of lower-outer quadrant of right female breast: Secondary | ICD-10-CM | POA: Diagnosis not present

## 2016-08-13 DIAGNOSIS — Z79899 Other long term (current) drug therapy: Secondary | ICD-10-CM | POA: Diagnosis not present

## 2016-08-13 DIAGNOSIS — Z170 Estrogen receptor positive status [ER+]: Secondary | ICD-10-CM

## 2016-08-13 DIAGNOSIS — Z5112 Encounter for antineoplastic immunotherapy: Secondary | ICD-10-CM | POA: Diagnosis not present

## 2016-08-13 DIAGNOSIS — C787 Secondary malignant neoplasm of liver and intrahepatic bile duct: Secondary | ICD-10-CM | POA: Diagnosis not present

## 2016-08-13 DIAGNOSIS — I10 Essential (primary) hypertension: Secondary | ICD-10-CM

## 2016-08-13 DIAGNOSIS — Z86718 Personal history of other venous thrombosis and embolism: Secondary | ICD-10-CM

## 2016-08-13 LAB — CBC WITH DIFFERENTIAL/PLATELET
BASO%: 0.8 % (ref 0.0–2.0)
BASOS ABS: 0.1 10*3/uL (ref 0.0–0.1)
EOS ABS: 0.2 10*3/uL (ref 0.0–0.5)
EOS%: 2.7 % (ref 0.0–7.0)
HEMATOCRIT: 37.2 % (ref 34.8–46.6)
HEMOGLOBIN: 12.5 g/dL (ref 11.6–15.9)
LYMPH#: 2.3 10*3/uL (ref 0.9–3.3)
LYMPH%: 26 % (ref 14.0–49.7)
MCH: 30.4 pg (ref 25.1–34.0)
MCHC: 33.6 g/dL (ref 31.5–36.0)
MCV: 90.4 fL (ref 79.5–101.0)
MONO#: 0.5 10*3/uL (ref 0.1–0.9)
MONO%: 5.2 % (ref 0.0–14.0)
NEUT#: 5.7 10*3/uL (ref 1.5–6.5)
NEUT%: 65.3 % (ref 38.4–76.8)
PLATELETS: 367 10*3/uL (ref 145–400)
RBC: 4.11 10*6/uL (ref 3.70–5.45)
RDW: 14.4 % (ref 11.2–14.5)
WBC: 8.8 10*3/uL (ref 3.9–10.3)

## 2016-08-13 LAB — COMPREHENSIVE METABOLIC PANEL
ALBUMIN: 2.8 g/dL — AB (ref 3.5–5.0)
ALK PHOS: 67 U/L (ref 40–150)
ALT: 32 U/L (ref 0–55)
AST: 47 U/L — ABNORMAL HIGH (ref 5–34)
Anion Gap: 10 mEq/L (ref 3–11)
BILIRUBIN TOTAL: 0.58 mg/dL (ref 0.20–1.20)
BUN: 9.3 mg/dL (ref 7.0–26.0)
CALCIUM: 9.6 mg/dL (ref 8.4–10.4)
CO2: 26 mEq/L (ref 22–29)
Chloride: 103 mEq/L (ref 98–109)
Creatinine: 0.8 mg/dL (ref 0.6–1.1)
Glucose: 104 mg/dl (ref 70–140)
POTASSIUM: 3.4 meq/L — AB (ref 3.5–5.1)
Sodium: 139 mEq/L (ref 136–145)
TOTAL PROTEIN: 8 g/dL (ref 6.4–8.3)

## 2016-08-13 LAB — UA PROTEIN, DIPSTICK - CHCC

## 2016-08-13 MED ORDER — ATROPINE SULFATE 1 MG/ML IJ SOLN
0.5000 mg | Freq: Once | INTRAMUSCULAR | Status: AC | PRN
  Administered 2016-08-13: 0.5 mg via INTRAVENOUS

## 2016-08-13 MED ORDER — IRINOTECAN HCL CHEMO INJECTION 100 MG/5ML
180.0000 mg/m2 | Freq: Once | INTRAVENOUS | Status: AC
  Administered 2016-08-13: 440 mg via INTRAVENOUS
  Filled 2016-08-13: qty 20

## 2016-08-13 MED ORDER — HEPARIN SOD (PORK) LOCK FLUSH 100 UNIT/ML IV SOLN
500.0000 [IU] | Freq: Once | INTRAVENOUS | Status: DC | PRN
  Filled 2016-08-13: qty 5

## 2016-08-13 MED ORDER — SODIUM CHLORIDE 0.9 % IV SOLN
Freq: Once | INTRAVENOUS | Status: AC
  Administered 2016-08-13: 13:00:00 via INTRAVENOUS

## 2016-08-13 MED ORDER — DEXAMETHASONE SODIUM PHOSPHATE 10 MG/ML IJ SOLN
INTRAMUSCULAR | Status: AC
  Filled 2016-08-13: qty 1

## 2016-08-13 MED ORDER — SODIUM CHLORIDE 0.9 % IV SOLN
2400.0000 mg/m2 | INTRAVENOUS | Status: DC
  Administered 2016-08-13: 5850 mg via INTRAVENOUS
  Filled 2016-08-13: qty 117

## 2016-08-13 MED ORDER — SODIUM CHLORIDE 0.9% FLUSH
10.0000 mL | INTRAVENOUS | Status: DC | PRN
  Filled 2016-08-13: qty 10

## 2016-08-13 MED ORDER — FLUOROURACIL CHEMO INJECTION 2.5 GM/50ML
405.0000 mg/m2 | Freq: Once | INTRAVENOUS | Status: AC
  Administered 2016-08-13: 1000 mg via INTRAVENOUS
  Filled 2016-08-13: qty 20

## 2016-08-13 MED ORDER — DEXAMETHASONE SODIUM PHOSPHATE 10 MG/ML IJ SOLN
10.0000 mg | Freq: Once | INTRAMUSCULAR | Status: AC
  Administered 2016-08-13: 10 mg via INTRAVENOUS

## 2016-08-13 MED ORDER — LEUCOVORIN CALCIUM INJECTION 350 MG
400.0000 mg/m2 | Freq: Once | INTRAVENOUS | Status: AC
  Administered 2016-08-13: 976 mg via INTRAVENOUS
  Filled 2016-08-13: qty 48.8

## 2016-08-13 MED ORDER — PALONOSETRON HCL INJECTION 0.25 MG/5ML
0.2500 mg | Freq: Once | INTRAVENOUS | Status: AC
  Administered 2016-08-13: 0.25 mg via INTRAVENOUS

## 2016-08-13 MED ORDER — ACETAMINOPHEN-CODEINE #4 300-60 MG PO TABS: 1.0000 | ORAL_TABLET | Freq: Three times a day (TID) | ORAL | 0 refills | Status: DC | PRN

## 2016-08-13 MED ORDER — ATROPINE SULFATE 1 MG/ML IJ SOLN
INTRAMUSCULAR | Status: AC
  Filled 2016-08-13: qty 1

## 2016-08-13 MED ORDER — SODIUM CHLORIDE 0.9% FLUSH
10.0000 mL | INTRAVENOUS | Status: DC | PRN
  Administered 2016-08-13: 10 mL via INTRAVENOUS
  Filled 2016-08-13: qty 10

## 2016-08-13 MED ORDER — BEVACIZUMAB CHEMO INJECTION 400 MG/16ML
5.0000 mg/kg | Freq: Once | INTRAVENOUS | Status: AC
  Administered 2016-08-13: 650 mg via INTRAVENOUS
  Filled 2016-08-13: qty 14

## 2016-08-13 MED ORDER — PALONOSETRON HCL INJECTION 0.25 MG/5ML
INTRAVENOUS | Status: AC
  Filled 2016-08-13: qty 5

## 2016-08-13 NOTE — Progress Notes (Addendum)
[facility] PROGRESS NOTE DATE OF VISIT: 08/13/2016   [name], MD 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, estrogen receptor positive ([place]) - Plan: Urine protein by dipstick  Cancer of sigmoid colon ([place]) - Plan: DISCONTINUED: 0.9 %  sodium chloride infusion, DISCONTINUED: sodium chloride flush (NS) 0.9 % injection 10 mL, DISCONTINUED: heparin lock flush 100 unit/mL, DISCONTINUED: atropine injection 0.5 mg, DISCONTINUED: dexamethasone (DECADRON) injection 10 mg, DISCONTINUED: palonosetron (ALOXI) injection 0.25 mg, DISCONTINUED: bevacizumab (AVASTIN) 650 mg in sodium chloride 0.9 % 100 mL chemo infusion, DISCONTINUED: irinotecan (CAMPTOSAR) 440 mg in dextrose 5 % 500 mL chemo infusion, DISCONTINUED: leucovorin 976 mg in dextrose 5 % 250 mL infusion, DISCONTINUED: fluorouracil (ADRUCIL) chemo injection 1,000 mg, DISCONTINUED: fluorouracil (ADRUCIL) 5,850 mg in sodium chloride 0.9 % 133 mL chemo infusion  Essential hypertension, benign  Personal history of venous thrombosis and embolism  Obesity, Class III, BMI 40-49.9 (morbid obesity) ([place])  PROBLEM LIST 1. Left breast DCIS diagnosed in 2006 and 2010, status post lumpectomy. 2. pT2N0M0 stage I colon cancer, s/p partial colectomy on 03/09/2014  3. PE in 12/2013 when she was diagnosed with colon cancer. 4. 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 [facility])   Staging form: Breast, AJCC 7th Edition     Clinical stage from 02/01/2015: Stage IA (T1a, N0, M0) - Signed by [name], MD on 06/04/2015     Pathologic stage from 11/10/2015: Stage IA (T1b, N0, M0) - Signed by [name], MD on 12/08/2015 Cancer of sigmoid colon [facility])   Staging form: Colon and Rectum, AJCC 7th Edition     Clinical: Stage I (T2, N0, M0) - Unsigned      Breast cancer of lower-outer quadrant of right female breast ([place])   2006 Cancer Diagnosis    Left breast DCIS diagnosed in 2006 and 2010, status post lumpectomy.      01/07/2014 Initial Diagnosis    Breast cancer      01/18/2015 Mammogram    28m mass in lower outer quadrant of right breast       02/01/2015 Initial Biopsy    (R) breast mass biopsy showed invasive ductal carcinoma & DCIS, grade 1-2.       02/01/2015 Receptors her2    ER 90%+, PR 10%+, Ki67 10%, HER2 negative (ratio 1.09)       11/07/2015 Surgery    (R) breast lumpectomy and sentinel lymph node biopsy ([name]      11/07/2015 Pathology Results    (R) breast lumpectomy showed invasive ductal carcinoma, grade 2, 1 cm, low-grade DCIS, negative margins, 3 sentinel lymph nodes were negative. LVI (-). HER2 repeated and remains negative (ratio 1.29).        11/07/2015 Pathologic Stage    pT1b,pN0: Stage IA       11/07/2015 Oncotype testing    RS 22, which predicts a year risk of distant recurrence with tamoxifen alone 14% (intermediate-risk). Adjuvant chemo was not offered      02/21/2016 - 04/10/2016 Radiation Therapy    Adjuvant breast radiation. ([name]). Right breast: 50.4 Gy in 28 fractions.  Right breast boost: 12 Gy in 6 fractions.      05/2016 -  Anti-estrogen oral therapy    Femara 2.5 mg daily.       07/31/2016 -  Chemotherapy  FLOFIRI every 2 weeks, Avastin added from cycles 2        Cancer of sigmoid colon ([place])   03/04/2014 Initial Diagnosis    Cancer of sigmoid colon      03/09/2014 Pathology Results    G1 invasive adenocarcinoma, no lymph-Vascular invasion or Peri-neural invasion: Absent. MMR normal, MSI stable.       03/09/2014 Surgery    partial colectomy, negative margins.       11/10/2014 Relapse/Recurrence    biopsy confirmed oligo liver recurrence       11/10/2014 Imaging    PET showed two small ares of hypermetabolism in the right lobe of the liver, no other distant metastasis.        11/30/2014 Imaging    abdomen MRI showed a new enhancing lesion which corresponds to an area of abnormal hyper metabolism on recent PET-CT. Stable enlarged portal caval lymph node.       01/02/2015 Pathology Results    Liver biopsy showed metastatic adenocarcinoma, IHC (+) for CK20, CDX-2, (-) for TTF-1 and ER      01/02/2015 Miscellaneous    liver biopsy KRAS mutation (+)       02/14/2015 Imaging    CT showed:  the small metastatic right hepatic lobe liver lesion is not identified for certain on this examination. No new lesions. Stable small cysts in segment 4 a.       03/05/2015 - 04/16/2015 Chemotherapy    CAPEOX (capecitabine 2500 mg twice daily Day 1-14, oxaliplatin 1 30 mg/m on day 1, every 21 days, S/P 3 cycles       08/24/2015 Procedure    CT-guided oligo liver metastasis microwave ablation by Dr. [name]      07/08/2016 PET scan    1. Large mass in the right lobe of the liver demonstrates diffuse hypermetabolism, compatible with recurrent metastatic disease in the liver 2. Multiple small pulmonary nodules are very similar to the recent Chest CT 06/17/16.  3. No other findings of metastatic disease elsewhere in the neck, chest, abdomen, or pelvis. 4. There is some low-level hypermetabolic activity adjacent to the surgical clips in the lateral aspect of the right breast at the site of prior lumpectomy.         CURRENT TREATMENT:  Letrozole 2.5 mg once daily, started in September 2017  INTERVAL HISTORY:  Veronica Reyes 61 y.o. female with a history of recurrent left breast cancer, colon cancer s/p laparoscopic-assisted partial colectomy (03/09/2014), PE (01/06/2014), and right breast cancer (01/2015),  presents to clinic for follow up.   She tolerated first cycle chemotherapy well 2 weeks ago, her main complaint is her taste change, for which she takes ginger candy and ginger beer (soft drinks). She denies any significant nausea, diarrhea, anorexia or other side  effects from chemotherapy. Her right upper abdominal pain has improved since she started chemotherapy 2 weeks ago, she cannot tolerate Vicodin which makes her drowsy, and she would like a refill Tylenol No.4. No other complaints.  MEDICAL HISTORY: Past Medical History:  Diagnosis Date  . Anxiety   . Breast CA ([place])    radiation and surgery-lt.  . Cancer ([place])    left breast cancer x2  . Colon cancer ([place])    Dr. Burr Medico seeing monthly  . Hypertension   . Liver lesion   . Pulmonary embolism ([place])    "blood clot in lungs" -dx. 4'15 with CT Chest. On Xarelto  . Radiation 01/22/10-03/12/10   left whole breast 4500 cGy,  upper aspect boosted to 6120 cGy  . Radiation 02/21/16 - 04/10/16   right breast 50.4 Gy, right breast boost 12 Gy  . Shortness of breath    sob with exertion. dx. with Pulmonary emboli 4'15 ,tx. Xarelto,Lovenox used for [employer].    ALLERGIES:  is allergic to tramadol.  MEDICATIONS:    Medication List       Accurate as of 08/13/16  1:42 PM. Always use your most recent med list.          acetaminophen-codeine 300-60 MG tablet Commonly known as:  TYLENOL #4 Take 1 tablet by mouth every 8 (eight) hours as needed. for pain   amLODipine 2.5 MG tablet Commonly known as:  NORVASC Take 2.5 mg by mouth daily.   ferrous sulfate 325 (65 FE) MG tablet Take 325 mg by mouth daily with breakfast.   hydrochlorothiazide 25 MG tablet Commonly known as:  HYDRODIURIL Take 1 tablet (25 mg total) by mouth every morning.   HYDROcodone-acetaminophen 5-325 MG tablet Commonly known as:  NORCO Take 1 tablet by mouth every 6 (six) hours as needed for moderate pain.   letrozole 2.5 MG tablet Commonly known as:  FEMARA Take 1 tablet (2.5 mg total) by mouth daily.   lidocaine-prilocaine cream Commonly known as:  EMLA Apply 1 application topically as needed.   metoprolol 50 MG tablet Commonly known as:  LOPRESSOR Take 1 tablet (50 mg total) by mouth 2 (two) times daily.    ondansetron 8 MG tablet Commonly known as:  ZOFRAN Take 1 tablet (8 mg total) by mouth 2 (two) times daily as needed for refractory nausea / vomiting. Start on day 3 after chemotherapy.   potassium chloride SA 20 MEQ tablet Commonly known as:  K-DUR,KLOR-CON Take 1 tablet (20 mEq total) by mouth daily.   prochlorperazine 10 MG tablet Commonly known as:  COMPAZINE Take 1 tablet (10 mg total) by mouth every 6 (six) hours as needed (NAUSEA).   XARELTO 20 MG Tabs tablet Generic drug:  rivaroxaban Take 1 tablet by mouth daily with supper   zolpidem 5 MG tablet Commonly known as:  AMBIEN Take 1 tablet (5 mg total) by mouth at bedtime as needed for sleep.       SURGICAL HISTORY:  Past Surgical History:  Procedure Laterality Date  . BREAST LUMPECTOMY WITH RADIOACTIVE SEED AND SENTINEL LYMPH NODE BIOPSY Right 11/07/2015   Procedure: BREAST LUMPECTOMY WITH RADIOACTIVE SEED AND SENTINEL LYMPH NODE BIOPSY;  Surgeon: Stark Klein, MD;  Location: [facility];  Service: General;  Laterality: Right;  . BREAST SURGERY     '11- Left breast lumpectomy  . CESAREAN SECTION    . CHOLECYSTECTOMY     Laparoscopic 20 yrs ago  . COLON SURGERY  03-09-14   partial colectomy for cancer  . IR GENERIC HISTORICAL  07/29/2016   IR FLUORO GUIDE PORT INSERTION RIGHT 07/29/2016 Sandi Mariscal, MD [facility]  . IR GENERIC HISTORICAL  07/29/2016   IR US GUIDE VASC ACCESS RIGHT 07/29/2016 Sandi Mariscal, MD [facility]  . IR GENERIC HISTORICAL  07/29/2016   IR US GUIDE BX ASP/DRAIN 07/29/2016 [facility]  . PARTIAL COLECTOMY N/A 03/09/2014   Procedure: LAPAROSCOPIC ASSISTED PARTIAL COLECTOMY, splenic flexure mobilization;  Surgeon: Leighton Ruff, MD;  Location: [facility];  Service: General;  Laterality: N/A;  . RADIOFREQUENCY ABLATION Right 08/24/2015   Procedure: MICROWAVE ABLATION RIGHT LIVER LOBE ;  Surgeon: Corrie Mckusick, DO;  Location: [facility];  Service: Anesthesiology;  Laterality: Right;  REVIEW OF SYSTEMS:   Constitutional: Denies fevers, chills or abnormal weight loss Eyes: Denies blurriness of vision Ears, nose, mouth, throat, and face: Denies mucositis or sore throat Respiratory: Denies cough, dyspnea or wheezes Cardiovascular: Denies palpitation, chest discomfort or lower extremity swelling Gastrointestinal:  Denies nausea, heartburn or change in bowel habits Skin: Denies abnormal skin rashes Musculoskeletal: (+) intermittent right-sided flank pain Lymphatics: Denies new lymphadenopathy or easy bruising Neurological:Denies numbness, tingling or new weaknesses Behavioral/Psych: Mood is stable, no new changes  All other systems were reviewed with the patient and are negative.    PHYSICAL EXAMINATION: ECOG PERFORMANCE STATUS: 1 Blood pressure 119/80, pulse 70, temperature 98.6 F (37 C), temperature source Oral, resp. rate 18, height _0  (1.676 m), weight 277 lb 4.8 oz (125.8 kg), last menstrual period 01/23/2014, SpO2 100 %. GENERAL:alert, no distress and comfortable; well developed and obese. Easily mobile to exam table.  SKIN: skin color, texture, turgor are normal, no rashes or significant lesions EYES: normal, Conjunctiva are pink and non-injected, sclera clear OROPHARYNX:no exudate, no erythema and lips, buccal mucosa, and tongue normal  NECK: supple, thyroid normal size, non-tender, without nodularity LYMPH:  no palpable lymphadenopathy in the cervical, axillary or supraclavicular LUNGS: clear to auscultation with normal breathing effort, no wheezes or rhonchi HEART: regular rate & rhythm and no murmurs and no lower extremity edema ABDOMEN:abdomen soft, non-tender and normal bowel sounds; incision healing with signs of infection.  Mild tenderness at the  Right flank area, no abdominal tenderness. No local megaly Musculoskeletal:no cyanosis of digits and no clubbing, (+) tenderness at right flank area  NEURO: alert & oriented x 3 with fluent speech, no  focal motor/sensory deficits   Labs:  CBC Latest Ref Rng & Units 08/13/2016 07/31/2016 07/29/2016  WBC 3.9 - 10.3 10e3/uL 8.8 10.0 10.1  Hemoglobin 11.6 - 15.9 g/dL 12.5 13.0 13.4  Hematocrit 34.8 - 46.6 % 37.2 38.5 40.0  Platelets 145 - 400 10e3/uL 367 284 332    CMP Latest Ref Rng & Units 08/13/2016 07/31/2016 07/29/2016  Glucose 70 - 140 mg/dl 104 107 117(H)  BUN 7.0 - 26.0 mg/dL 9.3 10.4 11  Creatinine 0.6 - 1.1 mg/dL 0.8 0.8 0.77  Sodium 136 - 145 mEq/L 139 139 138  Potassium 3.5 - 5.1 mEq/L 3.4(L) 3.4(L) 3.6  Chloride 101 - 111 mmol/L - - 101  CO2 22 - 29 mEq/L _1 Calcium 8.4 - 10.4 mg/dL 9.6 10.4 9.7  Total Protein 6.4 - 8.3 g/dL 8.0 8.9(H) 8.6(H)  Total Bilirubin 0.20 - 1.20 mg/dL 0.58 0.54 0.6  Alkaline Phos 40 - 150 U/L 67 75 61  AST 5 - 34 U/L 47(H) 37(H) 42(H)  ALT 0 - 55 U/L 32 24 27   CEA  10/04/2015   PATHOLOGY RESULT Diagnosis 01/02/2015 Liver, needle/core biopsy, right - POSITIVE FOR METASTATIC ADENOCARCINOMA. - SEE COMMENT. Microscopic Comment Immunohistochemical stains are performed. The tumor is positive for cytokeratin 20 and CDX-2. Cytokeratin 7 stains background bile ducts but is negative within the tumor. The tumor is negative for TTF-1 and estrogen receptor. The morphology coupled with the staining pattern is consistent with metastatic adenocarcinoma of colorectal primary source.   Diagnosis 11/07/2015 1. Breast, lumpectomy, Right - INVASIVE DUCTAL CARCINOMA, GRADE 2, SPANNING 1 CM. - DUCTAL CARCINOMA IN SITU, LOW GRADE. - RESECTION MARGINS ARE NEGATIVE FOR INVASIVE CARCINOMA. - DUCTAL CARCINOMA IN SITU COMES TO WITHIN 0.3 CM OF THE POSTERIOR MARGIN. - BIOPSY SITE. - SEE ONCOLOGY TABLE. 2. Lymph  node, sentinel, biopsy, right axillary #1 - ONE OF ONE LYMPH NODES NEGATIVE FOR CARCINOMA (0/1). 3. Lymph node, sentinel, biopsy, right axillary #2 - ONE OF ONE LYMPH NODES NEGATIVE FOR CARCINOMA (0/1). 4. Lymph node, sentinel, biopsy, right  axillary #3 - ONE OF ONE LYMPH NODES NEGATIVE FOR CARCINOMA (0/1). Microscopic Comment 1. BREAST, INVASIVE TUMOR, WITH LYMPH NODES PRESENT Specimen, including laterality and lymph node sampling (sentinel, non-sentinel): Right breast lump and right axillary sentinel lymph nodes. Procedure: Right breast lumpectomy and right axillary sentinel lymph node biopsy. Histologic type: Invasive ductal carcinoma. Grade: 2. Tubule formation: 3 Nuclear pleomorphism: 2 Mitotic:1 Tumor size (gross measurement): 1 cm Margins: Invasive, distance to closest margin: 0.2 cm (posterior). In-situ, distance to closest margin: 0.3 cm (posterior, focal). If margin positive, focally or broadly: N/A. Lymphovascular invasion: Not identified. Ductal carcinoma in situ: Present. Grade: I Extensive intraductal component: No. Lobular neoplasia: Not identified. Tumor focality: Unifocal. Treatment effect: N/A. Extent of tumor: Confined to breast parenchyma. Lymph nodes: Examined: 3 Sentinel 0 Non-sentinel 3 Total Lymph nodes with metastasis: 0 Isolated tumor cells (< 0.2 mm): 0 Micrometastasis: (> 0.2 mm and < 2.0 mm): 0 Macrometastasis: (> 2.0 mm): 0 Extracapsular extension: N/A. Breast prognostic profile: Performed on biopsy ([id number]), see below. Her2 will be repeated on the current specimen. Estrogen receptor: Positive, 90%, strong staining intensity. Progesterone receptor: Positive, 10%, strong staining intensity. Her 2 neu: Negative. Ki-67: 10%. Non-neoplastic breast: Fibrocystic change and usual ductal hyperplasia. Biopsy site. TNM: pT1b, pN0 Comments: None  ONCOTYPE DX: RS 22, which predicts a year risk of distant recurrence with tamoxifen alone 14%  Results: IMMUNOHISTOCHEMICAL AND MORPHOMETRIC ANALYSIS BY THE AUTOMATED CELLULAR IMAGING SYSTEM (ACIS) Estrogen Receptor: 90%, POSITIVE, STRONG STAINING INTENSITY (PERFORMED MANUALLY) Progesterone Receptor: 10%, POSITIVE, STRONG STAINING INTENSITY  (PERFORMED MANUALLY) Proliferation Marker Ki67: 10% 1. FLUORESCENCE IN-SITU HYBRIDIZATION Results: HER2 - NEGATIVE RATIO OF HER2/CEP17 SIGNALS 1.29 AVERAGE HER2 COPY NUMBER PER CELL 1.80  RADIOLOGY STUDIES   Abdomen MRI w wo contrast 05/29/2015  IMPRESSION: 1. Mild degradation secondary to patient body habitus. 2. Isolated segment 6 subcapsular liver lesion is similar. No new or progressive disease identified. 3. Interpolar right renal lesion is favored to represent a complex/septated cyst and is not significantly changed in size.  CT guided tissue ablation by Dr. Earleen Newport 08/24/2015 IMPRESSION: Status post image guided microwave ablation of solitary metastasis in the right liver lobe in this patient with oligo metastatic colorectal cancer.   CT CAP with contrast 06/17/2016 IMPRESSION: 1. Several new small pulmonary nodules worrisome for metastatic disease. 2. New large area of probable infiltrating recurrent tumor in the right hepatic lobe surrounding the prior ablation site. 3. Small supraclavicular lymph nodes.  Attention on future studies. 4. Stable surgical changes involving both breasts. No breast masses are identified. 5. Stable bilateral adrenal gland nodules. 6. Stable upper abdominal and right-sided retroperitoneal lymph nodes.Marland Kitchen  PET 07/08/2016 IMPRESSION: 1. Large mass in the right lobe of the liver demonstrates diffuse hypermetabolism, compatible with recurrent metastatic disease in the liver. 2. Multiple small pulmonary nodules are very similar to the recent chest CT 06/17/2016. These do not demonstrate hypermetabolism on the PET images, however, these lesions are well below the level of PET imaging. Given that these nodules are new compared to prior chest CT 12/18/2015, they are concerning for potential metastatic disease to the lungs, and continued attention on followup studies is recommended. 3. No other findings of metastatic disease elsewhere in the  neck, chest, abdomen or pelvis. 4. There  is some low-level hypermetabolic activity adjacent to the surgical clips in the lateral aspect of the right breast at the site of prior lumpectomy. This area has undergone interval involution over the several prior examinations, and this low-level activity is favored to be related to normal healing. Attention on follow-up imaging is recommended to ensure the stability or resolution of this finding. 5. Aortic atherosclerosis, in addition to 2 vessel coronary artery disease. Please note that although the presence of coronary artery calcium documents the presence of coronary artery disease, the severity of this disease and any potential stenosis cannot be assessed on this non-gated CT examination. Assessment for potential risk factor modification, dietary therapy or pharmacologic therapy may be warranted, if clinically indicated. 6. Additional incidental findings, as above.   ASSESSMENT: Sharolyn Douglas 60 y.o. female with a history of   1. Colon Cancer, Stage I (pT2N0), MMR normal, oligo recurrent disease in liver in 12/2014, KRAS mutation (+), liver recurrence in 06/2016 -I previously reviewed the nature history of metastatic colon cancer, and the potential curative approach with chemotherapy followed by liver lesion ablation or resection. However, more likely, this is not curable disease. -She received 3 cycles of [place]ox, does not want to proceed with fourth cycle due to the moderate side effects from previous chemotherapy treatment. She declined liver mass resection, and underwent liver ablation by interventional radiologist Dr. Earleen Newport in 08/2015. - I previously reviewed her restaging CT scan from 06/17/2016, which unfortunately showed new area of infiltrative disease as the previous ablated liver lesion, likely recurrence. She also developed several new pulmonary nodules, small, concerning for metastatic disease. -Her CEA level previously  increased significantly, most consistent with cancer progression. Last CEA level 48.0 on 05/28/2016 - I reviewed her PET scan from 07/08/2016 which unfortunately showed a large mass in the right lobe of the liver, consistent with recurrent metastatic disease. Giving her prior liver metastasis, significantly elevated CEA, as syncope distant likely from colon cancer. Metastatic breast cancer is much less likely but not ruled out. A biopsy of this mass will be scheduled.  -We discussed chemotherapy options again, she did not like oxaliplatin due to the cold sensitivity in the past, I recommend her to consider FOLFIRI and avatin, and the options of single agent irinitecan, xeloda, etc, were discussed with her also. Due to her MSI stable disease, she is not a candidate for immunotherapy outside a clinical trial setting. She has K-ras mutation, not a candidate for EGFR inhibitor.  -She has started chemotherapy FOLFIRI, tolerated first cycle very well, we'll continue every 2 weeks -We'll add Avastin to chemotherapy from today, potential benefits and side effects, especially bleeding, hypertension, proteinuria, bowel perforation, etc., were discussed with patient, she agrees to proceed   2. Right breast cancer, pT1bN0M0, stage Ia, ER+/PR+/HER2-, History of left Breast Cancer, diagnosed on 02/01/2015 --Left CIS in situ, s/p lumpectomy in 2006 and 2011 by Dr. Margot Chimes, and radiation in 2011 by Dr. Sondra Come.  -I reviewed her surgical pathology results from 11/10/2015, which showed a 1 cm grade 2 invasive ductal carcinoma and DCIS. Surgical margins were negative. -I reviewed her Oncotype DX result. Her recurrence score is 22, which predicts 14% 10 year risk of distant recurrence with tamoxifen alone. This is intermediate risk, based on her relatively low number, I do not recommend adjuvant chemotherapy. -She has completed adjuvant breast radiation -She has not had menstrual period for 2 years until one months ago she had  one episode of vaginal bleeding. -her [place] level supports  she is postmenopausal  -She has started adjuvant letrozole, we'll continue. -We also discussed breast cancer surveillance, I strongly encouraged her to continue annual screening mammogram, self exam, and follow-up with [place] routinely.  3. History of PE (01/06/2014) --She has a diagnosis of pulmonary embolism, likely secondary to malignancy in the setting of family history for stroke (and possible stroke history in patient as well). Lower extremity dopplers negative.  -continue Xarelto. Patient asked about if she can come off Xarelto. If she has no evidence of cancer in 6 months, I can potentially take her off Xarelto. She understands she has moderate to high risk of recurrent thrombosis if she is off anticoagulation.  4.  Uterine Fibroids  --Negative endometrial biopsy. Following with Gynecology.  5. Kidney lesion (Right). -No hypermetabolic right kidney lesion on the pet scan -Repeat abdominal MRI 05/29/2015 showed unchanged right renal lesion, favored to represent a complex/septated cyst.  6. Right flank pain - possible related to her liver metastasis -improved with chemo  -She'll continue Tylenol No. 4 as needed  7.  Morbid obesity - I encouraged her to eat healthy and exercise more. - I encouraged her to participate the " living well"  Program in our cancer center,  Which is designed for breast cancer survivor to improve your diet and exercise.  Plan  -Lab reviewed, adequate for treatment, she'll proceed second cycle FOLFIRI today, we'll add Avastin today, continue every 2 weeks -I'll see her back in 2 weeks -flu shot when she returns in 2 days for pump disconnection   All questions were answered. The patient knows to call the clinic with any problems, questions or concerns. We can certainly see the patient much sooner if necessary.  I spent 25 minutes counseling the patient face to face. The total time spent in the appointment  was 30 minutes.  [name]  08/13/2016

## 2016-08-13 NOTE — Progress Notes (Signed)
Oncology Nurse Navigator Documentation  Oncology Nurse Navigator Flowsheets 08/13/2016  Navigator Location [facility]  Navigator Encounter Type Treatment  Telephone -  Treatment Initiated Date 07/31/2016  Patient Visit Type MedOnc  Treatment Phase Active Tx--FOLFIRI/Avastin #2  Barriers/Navigation Needs Coordination of Care--asking about flu vaccine  Interventions Coordination of Care  Referrals -  Coordination of Care Other--spoke w/MD and obtain permission for flu vaccine on 08/15/16 with pump D/C. 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 Tells RN she knows there are people is worse condition than her and they are pushing forward. Her pain and diarrhea are better and she is sleeping better now as well. Had good holiday with her family.

## 2016-08-15 ENCOUNTER — Ambulatory Visit (HOSPITAL_BASED_OUTPATIENT_CLINIC_OR_DEPARTMENT_OTHER): Payer: BLUE CROSS/BLUE SHIELD

## 2016-08-15 VITALS — BP 137/91 | HR 64 | Temp 99.4°F | Resp 18

## 2016-08-15 DIAGNOSIS — C187 Malignant neoplasm of sigmoid colon: Secondary | ICD-10-CM

## 2016-08-15 MED ORDER — SODIUM CHLORIDE 0.9% FLUSH
10.0000 mL | INTRAVENOUS | Status: DC | PRN
  Administered 2016-08-15: 10 mL
  Filled 2016-08-15: qty 10

## 2016-08-15 MED ORDER — HEPARIN SOD (PORK) LOCK FLUSH 100 UNIT/ML IV SOLN
500.0000 [IU] | Freq: Once | INTRAVENOUS | Status: AC | PRN
  Administered 2016-08-15: 500 [IU]
  Filled 2016-08-15: qty 5

## 2016-08-21 ENCOUNTER — Telehealth: Payer: Self-pay | Admitting: General Practice

## 2016-08-21 NOTE — Telephone Encounter (Signed)
Spoke with pt confirmed 08/28/16 appts.

## 2016-08-26 NOTE — Progress Notes (Signed)
[facility] PROGRESS NOTE DATE OF VISIT: 08/28/2016   [name], MD 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, estrogen receptor positive ([place])  Cancer of sigmoid colon ([place]) - Plan: DISCONTINUED: 0.9 %  sodium chloride infusion, DISCONTINUED: sodium chloride flush (NS) 0.9 % injection 10 mL, DISCONTINUED: heparin lock flush 100 unit/mL, DISCONTINUED: atropine injection 0.5 mg, DISCONTINUED: dexamethasone (DECADRON) injection 10 mg, DISCONTINUED: palonosetron (ALOXI) injection 0.25 mg, DISCONTINUED: bevacizumab (AVASTIN) 650 mg in sodium chloride 0.9 % 100 mL chemo infusion, DISCONTINUED: irinotecan (CAMPTOSAR) 440 mg in dextrose 5 % 500 mL chemo infusion, DISCONTINUED: leucovorin 976 mg in dextrose 5 % 250 mL infusion, DISCONTINUED: fluorouracil (ADRUCIL) chemo injection 1,000 mg, DISCONTINUED: fluorouracil (ADRUCIL) 5,850 mg in sodium chloride 0.9 % 133 mL chemo infusion  Essential hypertension, benign  Personal history of venous thrombosis and embolism  Obesity, Class III, BMI 40-49.9 (morbid obesity) ([place])  PROBLEM LIST 1. Left breast DCIS diagnosed in 2006 and 2010, status post lumpectomy. 2. pT2N0M0 stage I colon cancer, s/p partial colectomy on 03/09/2014  3. PE in 12/2013 when she was diagnosed with colon cancer. 4. 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 [facility])   Staging form: Breast, AJCC 7th Edition     Clinical stage from 02/01/2015: Stage IA (T1a, N0, M0) - Signed by [name], MD on 06/04/2015     Pathologic stage from 11/10/2015: Stage IA (T1b, N0, M0) - Signed by [name], MD on 12/08/2015 Cancer of sigmoid colon [facility])   Staging form: Colon and Rectum, AJCC 7th Edition     Clinical: Stage I (T2, N0, M0) - Unsigned     Breast cancer of lower-outer  quadrant of right female breast ([place])   2006 Cancer Diagnosis    Left breast DCIS diagnosed in 2006 and 2010, status post lumpectomy.      01/07/2014 Initial Diagnosis    Breast cancer      01/18/2015 Mammogram    50m mass in lower outer quadrant of right breast       02/01/2015 Initial Biopsy    (R) breast mass biopsy showed invasive ductal carcinoma & DCIS, grade 1-2.       02/01/2015 Receptors her2    ER 90%+, PR 10%+, Ki67 10%, HER2 negative (ratio 1.09)       11/07/2015 Surgery    (R) breast lumpectomy and sentinel lymph node biopsy ([name]      11/07/2015 Pathology Results    (R) breast lumpectomy showed invasive ductal carcinoma, grade 2, 1 cm, low-grade DCIS, negative margins, 3 sentinel lymph nodes were negative. LVI (-). HER2 repeated and remains negative (ratio 1.29).        11/07/2015 Pathologic Stage    pT1b,pN0: Stage IA       11/07/2015 Oncotype testing    RS 22, which predicts a year risk of distant recurrence with tamoxifen alone 14% (intermediate-risk). Adjuvant chemo was not offered      02/21/2016 - 04/10/2016 Radiation Therapy    Adjuvant breast radiation. ([name]). Right breast: 50.4 Gy in 28 fractions.  Right breast boost: 12 Gy in 6 fractions.      05/2016 -  Anti-estrogen oral therapy    Femara 2.5 mg daily.        Cancer of sigmoid colon ([place]   03/04/2014 Initial  Diagnosis    Cancer of sigmoid colon      03/09/2014 Pathology Results    G1 invasive adenocarcinoma, no lymph-Vascular invasion or Peri-neural invasion: Absent. MMR normal, MSI stable.       03/09/2014 Surgery    partial colectomy, negative margins.       11/10/2014 Relapse/Recurrence    biopsy confirmed oligo liver recurrence       11/10/2014 Imaging    PET showed two small ares of hypermetabolism in the right lobe of the liver, no other distant metastasis.       11/30/2014 Imaging    abdomen MRI showed a new enhancing lesion which corresponds to an area of abnormal hyper  metabolism on recent PET-CT. Stable enlarged portal caval lymph node.       01/02/2015 Pathology Results    Liver biopsy showed metastatic adenocarcinoma, IHC (+) for CK20, CDX-2, (-) for TTF-1 and ER      01/02/2015 Miscellaneous    liver biopsy KRAS mutation (+)       02/14/2015 Imaging    CT showed:  the small metastatic right hepatic lobe liver lesion is not identified for certain on this examination. No new lesions. Stable small cysts in segment 4 a.       03/05/2015 - 04/16/2015 Chemotherapy    CAPEOX (capecitabine 2500 mg twice daily Day 1-14, oxaliplatin 1 30 mg/m on day 1, every 21 days, S/P 3 cycles       08/24/2015 Procedure    CT-guided oligo liver metastasis microwave ablation by Dr. [name]      06/17/2016 Imaging    CT of the chest/abd/pelvis with contrast 1. Several new small pulmonary nodules worrisome for metastatic disease. 2. New large area of probable infiltrating recurrent tumor in the right hepatic lobe surrounding the prior ablation site. 3. Small supraclavicular lymph nodes.  Attention on future studies. 4. Stable surgical changes involving both breasts. No breast masses are identified. 5. Stable bilateral adrenal gland nodules. 6. Stable upper abdominal and right-sided retroperitoneal lymph nodes.      07/08/2016 PET scan    1. Large mass in the right lobe of the liver demonstrates diffuse hypermetabolism, compatible with recurrent metastatic disease in the liver 2. Multiple small pulmonary nodules are very similar to the recent Chest CT 06/17/16.  3. No other findings of metastatic disease elsewhere in the neck, chest, abdomen, or pelvis. 4. There is some low-level hypermetabolic activity adjacent to the surgical clips in the lateral aspect of the right breast at the site of prior lumpectomy.       07/29/2016 Pathology Results    Liver biopsy confirmed metastatic colon cancer       07/31/2016 -  Chemotherapy    FOLFIRI every 2 weeks, Avastin added  from cycle 2         CURRENT TREATMENT:  Letrozole 2.5 mg once daily, started in September 2017  INTERVAL HISTORY:  Veronica Reyes 60 y.o. female with a history of recurrent left breast cancer, colon cancer s/p laparoscopic-assisted partial colectomy (03/09/2014), PE (01/06/2014), and right breast cancer (01/2015),  presents to clinic for follow up.   She complains of the metallic smell she emits from chemotherapy. Reports fatigue, but tries to remain active. Denies pain after chemo for 3 or 4 days, but then reports pain yesterday for which she took a pill. Reports she needs a refill for compazine.  MEDICAL HISTORY: Past Medical History:  Diagnosis Date  . Anxiety   . Breast CA ([place])  radiation and surgery-lt.  . Cancer ([place])    left breast cancer x2  . Colon cancer ([place])    Dr. Burr Medico seeing monthly  . Hypertension   . Liver lesion   . Pulmonary embolism ([place])    "blood clot in lungs" -dx. 4'15 with CT Chest. On Xarelto  . Radiation 01/22/10-03/12/10   left whole breast 4500 cGy, upper aspect boosted to 6120 cGy  . Radiation 02/21/16 - 04/10/16   right breast 50.4 Gy, right breast boost 12 Gy  . Shortness of breath    sob with exertion. dx. with Pulmonary emboli 4'15 ,tx. Xarelto,Lovenox used for [employer].    ALLERGIES:  is allergic to tramadol.  MEDICATIONS:  Allergies as of 08/28/2016      Reactions   Tramadol Other (See Comments)   Dizziness      Medication List       Accurate as of 08/28/16 10:27 PM. Always use your most recent med list.          acetaminophen-codeine 300-60 MG tablet Commonly known as:  TYLENOL #4 Take 1 tablet by mouth every 8 (eight) hours as needed. for pain   amLODipine 2.5 MG tablet Commonly known as:  NORVASC Take 2.5 mg by mouth daily.   ferrous sulfate 325 (65 FE) MG tablet Take 325 mg by mouth daily with breakfast. Pt takes  4 times per week.   hydrochlorothiazide 25 MG tablet Commonly known as:  HYDRODIURIL Take 1 tablet  (25 mg total) by mouth every morning.   HYDROcodone-acetaminophen 5-325 MG tablet Commonly known as:  NORCO Take 1 tablet by mouth every 6 (six) hours as needed for moderate pain.   letrozole 2.5 MG tablet Commonly known as:  FEMARA Take 1 tablet (2.5 mg total) by mouth daily.   lidocaine-prilocaine cream Commonly known as:  EMLA Apply 1 application topically as needed.   metoprolol 50 MG tablet Commonly known as:  LOPRESSOR Take 1 tablet (50 mg total) by mouth 2 (two) times daily.   ondansetron 8 MG tablet Commonly known as:  ZOFRAN Take 1 tablet (8 mg total) by mouth 2 (two) times daily as needed for refractory nausea / vomiting. Start on day 3 after chemotherapy.   potassium chloride SA 20 MEQ tablet Commonly known as:  K-DUR,KLOR-CON Take 1 tablet (20 mEq total) by mouth daily.   prochlorperazine 10 MG tablet Commonly known as:  COMPAZINE Take 1 tablet (10 mg total) by mouth every 6 (six) hours as needed (NAUSEA).   XARELTO 20 MG Tabs tablet Generic drug:  rivaroxaban Take 1 tablet by mouth daily with supper   zolpidem 5 MG tablet Commonly known as:  AMBIEN Take 1 tablet (5 mg total) by mouth at bedtime as needed for sleep.       SURGICAL HISTORY:  Past Surgical History:  Procedure Laterality Date  . BREAST LUMPECTOMY WITH RADIOACTIVE SEED AND SENTINEL LYMPH NODE BIOPSY Right 11/07/2015   Procedure: BREAST LUMPECTOMY WITH RADIOACTIVE SEED AND SENTINEL LYMPH NODE BIOPSY;  Surgeon: Stark Klein, MD;  Location: [facility];  Service: General;  Laterality: Right;  . BREAST SURGERY     '11- Left breast lumpectomy  . CESAREAN SECTION    . CHOLECYSTECTOMY     Laparoscopic 20 yrs ago  . COLON SURGERY  03-09-14   partial colectomy for cancer  . IR GENERIC HISTORICAL  07/29/2016   IR FLUORO GUIDE PORT INSERTION RIGHT 07/29/2016 Sandi Mariscal, MD [facility]  . IR GENERIC HISTORICAL  07/29/2016   IR US GUIDE VASC ACCESS RIGHT 07/29/2016 Sandi Mariscal, MD  [facility]  . IR GENERIC HISTORICAL  07/29/2016   IR US GUIDE BX ASP/DRAIN 07/29/2016 [facility]  . PARTIAL COLECTOMY N/A 03/09/2014   Procedure: LAPAROSCOPIC ASSISTED PARTIAL COLECTOMY, splenic flexure mobilization;  Surgeon: Leighton Ruff, MD;  Location: [facility];  Service: General;  Laterality: N/A;  . RADIOFREQUENCY ABLATION Right 08/24/2015   Procedure: MICROWAVE ABLATION RIGHT LIVER LOBE ;  Surgeon: Corrie Mckusick, DO;  Location: [facility];  Service: Anesthesiology;  Laterality: Right;    REVIEW OF SYSTEMS:   Constitutional: Denies fevers, chills or abnormal weight loss Eyes: Denies blurriness of vision Ears, nose, mouth, throat, and face: Denies mucositis or sore throat Respiratory: Denies cough, dyspnea or wheezes Cardiovascular: Denies palpitation, chest discomfort or lower extremity swelling Gastrointestinal:  Denies heartburn or change in bowel habits. (+) Nausea Skin: Denies abnormal skin rashes Musculoskeletal: (+) intermittent right-sided flank pain. Lymphatics: Denies new lymphadenopathy or easy bruising Neurological:Denies numbness, tingling or new weaknesses Behavioral/Psych: Mood is stable, no new changes  All other systems were reviewed with the patient and are negative.    PHYSICAL EXAMINATION: ECOG PERFORMANCE STATUS: 1 Blood pressure (!) 156/90, temperature 98.8 F (37.1 C), resp. rate 18, height '5\' 6"'  (1.676 m), weight 275 lb 11.2 oz (125.1 kg), last menstrual period 01/23/2014. GENERAL:alert, no distress and comfortable; well developed and obese. Easily mobile to exam table.  SKIN: skin color, texture, turgor are normal, no rashes or significant lesions EYES: normal, Conjunctiva are pink and non-injected, sclera clear OROPHARYNX:no exudate, no erythema and lips, buccal mucosa, and tongue normal  NECK: supple, thyroid normal size, non-tender, without nodularity LYMPH:  no palpable lymphadenopathy in the cervical, axillary or supraclavicular LUNGS: clear to  auscultation with normal breathing effort, no wheezes or rhonchi HEART: regular rate & rhythm and no murmurs and no lower extremity edema ABDOMEN:abdomen soft, non-tender and normal bowel sounds; incision healing with signs of infection.  Mild tenderness at the  Right flank area, no abdominal tenderness. No local megaly Musculoskeletal:no cyanosis of digits and no clubbing, (+) tenderness at right flank area NEURO: alert & oriented x 3 with fluent speech, no focal motor/sensory deficits   Labs:  CBC Latest Ref Rng & Units 08/28/2016 08/13/2016 07/31/2016  WBC 3.9 - 10.3 10e3/uL 6.4 8.8 10.0  Hemoglobin 11.6 - 15.9 g/dL 12.5 12.5 13.0  Hematocrit 34.8 - 46.6 % 37.8 37.2 38.5  Platelets 145 - 400 10e3/uL 283 367 284    CMP Latest Ref Rng & Units 08/28/2016 08/13/2016 07/31/2016  Glucose 70 - 140 mg/dl 105 104 107  BUN 7.0 - 26.0 mg/dL 7.3 9.3 10.4  Creatinine 0.6 - 1.1 mg/dL 0.8 0.8 0.8  Sodium 136 - 145 mEq/L 139 139 139  Potassium 3.5 - 5.1 mEq/L 3.4(L) 3.4(L) 3.4(L)  Chloride 101 - 111 mmol/L - - -  CO2 22 - 29 mEq/L '25 26 24  ' Calcium 8.4 - 10.4 mg/dL 9.7 9.6 10.4  Total Protein 6.4 - 8.3 g/dL 7.9 8.0 8.9(H)  Total Bilirubin 0.20 - 1.20 mg/dL 0.51 0.58 0.54  Alkaline Phos 40 - 150 U/L 67 67 75  AST 5 - 34 U/L 34 47(H) 37(H)  ALT 0 - 55 U/L 27 32 24   CEA  01/18/2014: 0.8 03/01/2015: 1.3 01/31/2016: 4.8 05/28/2016: 48 07/31/2016: 173.7  PATHOLOGY RESULT Diagnosis 01/02/2015 Liver, needle/core biopsy, right - POSITIVE FOR METASTATIC ADENOCARCINOMA. - SEE COMMENT. Microscopic Comment Immunohistochemical stains are performed. The tumor is  positive for cytokeratin 20 and CDX-2. Cytokeratin 7 stains background bile ducts but is negative within the tumor. The tumor is negative for TTF-1 and estrogen receptor. The morphology coupled with the staining pattern is consistent with metastatic adenocarcinoma of colorectal primary source.   Diagnosis 11/07/2015 1. Breast, lumpectomy,  Right - INVASIVE DUCTAL CARCINOMA, GRADE 2, SPANNING 1 CM. - DUCTAL CARCINOMA IN SITU, LOW GRADE. - RESECTION MARGINS ARE NEGATIVE FOR INVASIVE CARCINOMA. - DUCTAL CARCINOMA IN SITU COMES TO WITHIN 0.3 CM OF THE POSTERIOR MARGIN. - BIOPSY SITE. - SEE ONCOLOGY TABLE. 2. Lymph node, sentinel, biopsy, right axillary #1 - ONE OF ONE LYMPH NODES NEGATIVE FOR CARCINOMA (0/1). 3. Lymph node, sentinel, biopsy, right axillary #2 - ONE OF ONE LYMPH NODES NEGATIVE FOR CARCINOMA (0/1). 4. Lymph node, sentinel, biopsy, right axillary #3 - ONE OF ONE LYMPH NODES NEGATIVE FOR CARCINOMA (0/1). Microscopic Comment 1. BREAST, INVASIVE TUMOR, WITH LYMPH NODES PRESENT Specimen, including laterality and lymph node sampling (sentinel, non-sentinel): Right breast lump and right axillary sentinel lymph nodes. Procedure: Right breast lumpectomy and right axillary sentinel lymph node biopsy. Histologic type: Invasive ductal carcinoma. Grade: 2. Tubule formation: 3 Nuclear pleomorphism: 2 Mitotic:1 Tumor size (gross measurement): 1 cm Margins: Invasive, distance to closest margin: 0.2 cm (posterior). In-situ, distance to closest margin: 0.3 cm (posterior, focal). If margin positive, focally or broadly: N/A. Lymphovascular invasion: Not identified. Ductal carcinoma in situ: Present. Grade: I Extensive intraductal component: No. Lobular neoplasia: Not identified. Tumor focality: Unifocal. Treatment effect: N/A. Extent of tumor: Confined to breast parenchyma. Lymph nodes: Examined: 3 Sentinel 0 Non-sentinel 3 Total Lymph nodes with metastasis: 0 Isolated tumor cells (< 0.2 mm): 0 Micrometastasis: (> 0.2 mm and < 2.0 mm): 0 Macrometastasis: (> 2.0 mm): 0 Extracapsular extension: N/A. Breast prognostic profile: Performed on biopsy ([id number]), see below. Her2 will be repeated on the current specimen. Estrogen receptor: Positive, 90%, strong staining intensity. Progesterone receptor: Positive, 10%,  strong staining intensity. Her 2 neu: Negative. Ki-67: 10%. Non-neoplastic breast: Fibrocystic change and usual ductal hyperplasia. Biopsy site. TNM: pT1b, pN0 Comments: None  ONCOTYPE DX: RS 22, which predicts a year risk of distant recurrence with tamoxifen alone 14%  Results: IMMUNOHISTOCHEMICAL AND MORPHOMETRIC ANALYSIS BY THE AUTOMATED CELLULAR IMAGING SYSTEM (ACIS) Estrogen Receptor: 90%, POSITIVE, STRONG STAINING INTENSITY (PERFORMED MANUALLY) Progesterone Receptor: 10%, POSITIVE, STRONG STAINING INTENSITY (PERFORMED MANUALLY) Proliferation Marker Ki67: 10% 1. FLUORESCENCE IN-SITU HYBRIDIZATION Results: HER2 - NEGATIVE RATIO OF HER2/CEP17 SIGNALS 1.29 AVERAGE HER2 COPY NUMBER PER CELL 1.80   Diagnosis 07/29/2016 Liver, needle/core biopsy, posterior of right lobe METASTATIC ADENOCARCINOMA, CONSISTENT WITH COLONIC PRIMARY. Microscopic Comment The neoplasm stains positive for ck20, cdx 2, negative for ck7. The morphology and immunostaining pattern support the above diagnosis. RADIOLOGY STUDIES   Abdomen MRI w wo contrast 05/29/2015  IMPRESSION: 1. Mild degradation secondary to patient body habitus. 2. Isolated segment 6 subcapsular liver lesion is similar. No new or progressive disease identified. 3. Interpolar right renal lesion is favored to represent a complex/septated cyst and is not significantly changed in size.  CT guided tissue ablation by Dr. Earleen Newport 08/24/2015 IMPRESSION: Status post image guided microwave ablation of solitary metastasis in the right liver lobe in this patient with oligo metastatic colorectal cancer.   CT CAP with contrast 06/17/2016 IMPRESSION: 1. Several new small pulmonary nodules worrisome for metastatic disease. 2. New large area of probable infiltrating recurrent tumor in the right hepatic lobe surrounding the prior ablation site. 3. Small supraclavicular lymph nodes.  Attention on  future studies. 4. Stable surgical changes involving  both breasts. No breast masses are identified. 5. Stable bilateral adrenal gland nodules. 6. Stable upper abdominal and right-sided retroperitoneal lymph nodes.Marland Kitchen  PET 07/08/2016 IMPRESSION: 1. Large mass in the right lobe of the liver demonstrates diffuse hypermetabolism, compatible with recurrent metastatic disease in the liver. 2. Multiple small pulmonary nodules are very similar to the recent chest CT 06/17/2016. These do not demonstrate hypermetabolism on the PET images, however, these lesions are well below the level of PET imaging. Given that these nodules are new compared to prior chest CT 12/18/2015, they are concerning for potential metastatic disease to the lungs, and continued attention on followup studies is recommended. 3. No other findings of metastatic disease elsewhere in the neck, chest, abdomen or pelvis. 4. There is some low-level hypermetabolic activity adjacent to the surgical clips in the lateral aspect of the right breast at the site of prior lumpectomy. This area has undergone interval involution over the several prior examinations, and this low-level activity is favored to be related to normal healing. Attention on follow-up imaging is recommended to ensure the stability or resolution of this finding. 5. Aortic atherosclerosis, in addition to 2 vessel coronary artery disease. Please note that although the presence of coronary artery calcium documents the presence of coronary artery disease, the severity of this disease and any potential stenosis cannot be assessed on this non-gated CT examination. Assessment for potential risk factor modification, dietary therapy or pharmacologic therapy may be warranted, if clinically indicated. 6. Additional incidental findings, as above.   ASSESSMENT: Sharolyn Douglas 60 y.o. female with a history of...  1. Colon Cancer, Stage I (pT2N0), MMR normal, oligo recurrent disease in liver in 12/2014, KRAS mutation (+),  liver recurrence in 06/2016 -I previously reviewed the nature history of metastatic colon cancer, and the potential curative approach with chemotherapy followed by liver lesion ablation or resection. However, more likely, this is not curable disease. -She received 3 cycles of [place]ox, does not want to proceed with fourth cycle due to the moderate side effects from previous chemotherapy treatment. She declined liver mass resection, and underwent liver ablation by interventional radiologist Dr. Earleen Newport in 08/2015. - I previously reviewed her restaging CT scan from 06/17/2016, which unfortunately showed new area of infiltrative disease as the previous ablated liver lesion, likely recurrence. She also developed several new pulmonary nodules, small, concerning for metastatic disease. -Her CEA level previously increased significantly, most consistent with cancer progression. Last CEA level 48.0 on 05/28/2016 - I previously reviewed her PET scan from 07/08/2016 which unfortunately showed a large mass in the right lobe of the liver, consistent with recurrent metastatic disease. Giving her prior liver metastasis, significantly elevated CEA, as syncope distant likely from colon cancer. Metastatic breast cancer is much less likely but not ruled out. A biopsy of this mass was scheduled. -We discussed her liver biopsy from 07/29/16: It revealed metastatic adenocarcinoma of the colon. -We discussed chemotherapy options again, she did not like oxaliplatin due to the cold sensitivity in the past, I recommend her to consider FOLFIRI and avatin, and the options of single agent irinitecan, xeloda, etc, were discussed with her also. Due to her MSI stable disease, she is not a candidate for immunotherapy outside a clinical trial setting. She has K-ras mutation, not a candidate for EGFR inhibitor. -She has started chemotherapy FOLFIRI and avastin, has been tolerating well, we'll continue every 2 weeks. -plan to repeat staging CT  after 5-6 cycles chemo  2. Right breast cancer, pT1bN0M0, stage Ia, ER+/PR+/HER2-, History of left Breast Cancer, diagnosed on 02/01/2015 --Left CIS in situ, s/p lumpectomy in 2006 and 2011 by Dr. Margot Chimes, and radiation in 2011 by Dr. Sondra Come.  -I reviewed her surgical pathology results from 11/10/2015, which showed a 1 cm grade 2 invasive ductal carcinoma and DCIS. Surgical margins were negative. -I reviewed her Oncotype DX result. Her recurrence score is 22, which predicts 14% 10 year risk of distant recurrence with tamoxifen alone. This is intermediate risk, based on her relatively low number, I do not recommend adjuvant chemotherapy. -She has completed adjuvant breast radiation -She has not had menstrual period for 2 years until one months ago she had one episode of vaginal bleeding. -her [place] level supports she is postmenopausal -She has started adjuvant letrozole, we'll continue. -We also discussed breast cancer surveillance, I strongly encouraged her to continue annual screening mammogram, self exam, and follow-up with [place] routinely.  3. History of PE (01/06/2014) --She has a diagnosis of pulmonary embolism, likely secondary to malignancy in the setting of family history for stroke (and possible stroke history in patient as well). Lower extremity dopplers negative.  -continue Xarelto. Due to her metastatic colon cancer, I recommend her to continue indefinitely.  4.  Uterine Fibroids  --Negative endometrial biopsy. Following with Gynecology.  5. Kidney lesion (Right). -No hypermetabolic right kidney lesion on the pet scan -Repeat abdominal MRI 05/29/2015 showed unchanged right renal lesion, favored to represent a complex/septated cyst.  6. Right flank pain - possible related to her liver metastasis -improved with chemo  -She'll continue Tylenol No. 4 as needed  7.  Morbid obesity - I encouraged her to eat healthy and exercise more. - I encouraged her to participate the " living well"   Program in our cancer center,  Which is designed for breast cancer survivor to improve your diet and exercise.  8. HTN  -The patient's blood pressure is high today. Could be related to the Avastin. -She does not have a monitor at home. I advised her to monitor at home. She will continue following up with her PCP  Plan  -Lab reviewed, adequate for treatment, she'll proceed third cycle FOLFIRI and Avastin today, continue every 2 weeks. -Lab, flush, f/u and chemo FOLFIRI and Avastin on 09/11/16 and 09/25/16. -I refilled Compazine and potassium chloride SA. -I will see her back in 4 weeks   All questions were answered. The patient knows to call the clinic with any problems, questions or concerns. We can certainly see the patient much sooner if necessary.  I spent 25 minutes counseling the patient face to face. The total time spent in the appointment was 30 minutes.  [name]  08/28/2016   This document serves as a record of services personally performed by [name], MD. It was created on her behalf by Darcus Austin, a trained medical scribe. The creation of this record is based on the scribe's personal observations and the provider's statements to them. This document has been checked and approved by the attending provider.

## 2016-08-28 ENCOUNTER — Other Ambulatory Visit: Payer: Self-pay | Admitting: *Deleted

## 2016-08-28 ENCOUNTER — Other Ambulatory Visit (HOSPITAL_BASED_OUTPATIENT_CLINIC_OR_DEPARTMENT_OTHER): Payer: BLUE CROSS/BLUE SHIELD

## 2016-08-28 ENCOUNTER — Ambulatory Visit: Payer: BLUE CROSS/BLUE SHIELD

## 2016-08-28 ENCOUNTER — Ambulatory Visit (HOSPITAL_BASED_OUTPATIENT_CLINIC_OR_DEPARTMENT_OTHER): Payer: BLUE CROSS/BLUE SHIELD | Admitting: Hematology

## 2016-08-28 ENCOUNTER — Ambulatory Visit (HOSPITAL_BASED_OUTPATIENT_CLINIC_OR_DEPARTMENT_OTHER): Payer: BLUE CROSS/BLUE SHIELD

## 2016-08-28 ENCOUNTER — Encounter: Payer: Self-pay | Admitting: Hematology

## 2016-08-28 VITALS — BP 144/90 | HR 65

## 2016-08-28 VITALS — BP 156/90 | Temp 98.8°F | Resp 18 | Ht 66.0 in | Wt 275.7 lb

## 2016-08-28 DIAGNOSIS — Z5111 Encounter for antineoplastic chemotherapy: Secondary | ICD-10-CM | POA: Diagnosis not present

## 2016-08-28 DIAGNOSIS — C187 Malignant neoplasm of sigmoid colon: Secondary | ICD-10-CM

## 2016-08-28 DIAGNOSIS — Z86718 Personal history of other venous thrombosis and embolism: Secondary | ICD-10-CM

## 2016-08-28 DIAGNOSIS — C787 Secondary malignant neoplasm of liver and intrahepatic bile duct: Secondary | ICD-10-CM

## 2016-08-28 DIAGNOSIS — I10 Essential (primary) hypertension: Secondary | ICD-10-CM | POA: Diagnosis not present

## 2016-08-28 DIAGNOSIS — C50511 Malignant neoplasm of lower-outer quadrant of right female breast: Secondary | ICD-10-CM

## 2016-08-28 DIAGNOSIS — Z5112 Encounter for antineoplastic immunotherapy: Secondary | ICD-10-CM

## 2016-08-28 DIAGNOSIS — Z170 Estrogen receptor positive status [ER+]: Principal | ICD-10-CM

## 2016-08-28 DIAGNOSIS — E66813 Obesity, class 3: Secondary | ICD-10-CM

## 2016-08-28 LAB — COMPREHENSIVE METABOLIC PANEL
ALT: 27 U/L (ref 0–55)
AST: 34 U/L (ref 5–34)
Albumin: 2.9 g/dL — ABNORMAL LOW (ref 3.5–5.0)
Alkaline Phosphatase: 67 U/L (ref 40–150)
Anion Gap: 11 mEq/L (ref 3–11)
BUN: 7.3 mg/dL (ref 7.0–26.0)
CHLORIDE: 103 meq/L (ref 98–109)
CO2: 25 meq/L (ref 22–29)
CREATININE: 0.8 mg/dL (ref 0.6–1.1)
Calcium: 9.7 mg/dL (ref 8.4–10.4)
EGFR: >90 mL/min/{1.73_m2} (ref >90)
Glucose: 105 mg/dl (ref 70–140)
POTASSIUM: 3.4 meq/L — AB (ref 3.5–5.1)
Sodium: 139 mEq/L (ref 136–145)
Total Bilirubin: 0.51 mg/dL (ref 0.20–1.20)
Total Protein: 7.9 g/dL (ref 6.4–8.3)

## 2016-08-28 LAB — CBC WITH DIFFERENTIAL/PLATELET
BASO%: 0.5 % (ref 0.0–2.0)
Basophils Absolute: 0 10*3/uL (ref 0.0–0.1)
EOS%: 1.1 % (ref 0.0–7.0)
Eosinophils Absolute: 0.1 10*3/uL (ref 0.0–0.5)
HCT: 37.8 % (ref 34.8–46.6)
HGB: 12.5 g/dL (ref 11.6–15.9)
LYMPH%: 40.1 % (ref 14.0–49.7)
MCH: 30.5 pg (ref 25.1–34.0)
MCHC: 33.1 g/dL (ref 31.5–36.0)
MCV: 92.2 fL (ref 79.5–101.0)
MONO#: 0.5 10*3/uL (ref 0.1–0.9)
MONO%: 7.1 % (ref 0.0–14.0)
NEUT%: 51.2 % (ref 38.4–76.8)
NEUTROS ABS: 3.3 10*3/uL (ref 1.5–6.5)
Platelets: 283 10*3/uL (ref 145–400)
RBC: 4.1 10*6/uL (ref 3.70–5.45)
RDW: 15.8 % — ABNORMAL HIGH (ref 11.2–14.5)
WBC: 6.4 10*3/uL (ref 3.9–10.3)
lymph#: 2.6 10*3/uL (ref 0.9–3.3)
nRBC: 0 % (ref 0–0)

## 2016-08-28 LAB — CEA (IN HOUSE-CHCC): CEA (CHCC-In House): 138.97 ng/mL — ABNORMAL HIGH (ref 0.00–5.00)

## 2016-08-28 MED ORDER — FLUOROURACIL CHEMO INJECTION 2.5 GM/50ML
400.0000 mg/m2 | Freq: Once | INTRAVENOUS | Status: AC
  Administered 2016-08-28: 1000 mg via INTRAVENOUS
  Filled 2016-08-28: qty 20

## 2016-08-28 MED ORDER — PALONOSETRON HCL INJECTION 0.25 MG/5ML
INTRAVENOUS | Status: AC
  Filled 2016-08-28: qty 5

## 2016-08-28 MED ORDER — ATROPINE SULFATE 1 MG/ML IJ SOLN
INTRAMUSCULAR | Status: AC
  Filled 2016-08-28: qty 1

## 2016-08-28 MED ORDER — IRINOTECAN HCL CHEMO INJECTION 100 MG/5ML
180.0000 mg/m2 | Freq: Once | INTRAVENOUS | Status: AC
  Administered 2016-08-28: 440 mg via INTRAVENOUS
  Filled 2016-08-28: qty 20

## 2016-08-28 MED ORDER — SODIUM CHLORIDE 0.9 % IV SOLN
Freq: Once | INTRAVENOUS | Status: AC
  Administered 2016-08-28: 12:00:00 via INTRAVENOUS

## 2016-08-28 MED ORDER — SODIUM CHLORIDE 0.9 % IV SOLN
5.0000 mg/kg | Freq: Once | INTRAVENOUS | Status: AC
  Administered 2016-08-28: 650 mg via INTRAVENOUS
  Filled 2016-08-28: qty 14

## 2016-08-28 MED ORDER — SODIUM CHLORIDE 0.9 % IV SOLN
2400.0000 mg/m2 | INTRAVENOUS | Status: DC
  Administered 2016-08-28: 5850 mg via INTRAVENOUS
  Filled 2016-08-28: qty 117

## 2016-08-28 MED ORDER — POTASSIUM CHLORIDE CRYS ER 20 MEQ PO TBCR: 20.0000 meq | EXTENDED_RELEASE_TABLET | Freq: Every day | ORAL | 2 refills | Status: DC

## 2016-08-28 MED ORDER — DEXAMETHASONE SODIUM PHOSPHATE 10 MG/ML IJ SOLN
10.0000 mg | Freq: Once | INTRAMUSCULAR | Status: AC
  Administered 2016-08-28: 10 mg via INTRAVENOUS

## 2016-08-28 MED ORDER — ATROPINE SULFATE 1 MG/ML IJ SOLN
0.5000 mg | Freq: Once | INTRAMUSCULAR | Status: AC | PRN
  Administered 2016-08-28: 0.5 mg via INTRAVENOUS

## 2016-08-28 MED ORDER — DEXAMETHASONE SODIUM PHOSPHATE 10 MG/ML IJ SOLN
INTRAMUSCULAR | Status: AC
  Filled 2016-08-28: qty 1

## 2016-08-28 MED ORDER — PALONOSETRON HCL INJECTION 0.25 MG/5ML
0.2500 mg | Freq: Once | INTRAVENOUS | Status: AC
  Administered 2016-08-28: 0.25 mg via INTRAVENOUS

## 2016-08-28 MED ORDER — HEPARIN SOD (PORK) LOCK FLUSH 100 UNIT/ML IV SOLN
500.0000 [IU] | Freq: Once | INTRAVENOUS | Status: DC | PRN
  Filled 2016-08-28: qty 5

## 2016-08-28 MED ORDER — LEUCOVORIN CALCIUM INJECTION 350 MG
400.0000 mg/m2 | Freq: Once | INTRAVENOUS | Status: AC
  Administered 2016-08-28: 976 mg via INTRAVENOUS
  Filled 2016-08-28: qty 48.8

## 2016-08-28 MED ORDER — SODIUM CHLORIDE 0.9% FLUSH
10.0000 mL | INTRAVENOUS | Status: DC | PRN
  Administered 2016-08-28: 10 mL
  Filled 2016-08-28: qty 10

## 2016-08-28 MED ORDER — PROCHLORPERAZINE MALEATE 10 MG PO TABS
10.0000 mg | ORAL_TABLET | Freq: Four times a day (QID) | ORAL | 1 refills | Status: DC | PRN
Start: 2016-08-28 — End: 2016-10-09

## 2016-08-28 NOTE — Progress Notes (Signed)
Patient refused flu shot.

## 2016-08-28 NOTE — Patient Instructions (Signed)
Ventricular Assist Device, Home Care It is important to follow home care instructions to care for your ventricular assist device (VAD). Going home (destination therapy) with a VAD is becoming more common for people who:  Are not candidates for heart transplantation and need a VAD.  Have heart failure and need time to let the heart rest.  Are awaiting a heart transplant. PARTS OF A VAD A VAD consists of the following parts:  Inflow tube. This tube carries oxygenated blood from the left ventricle to the VAD pump.  Outflow tube. This tube carries blood from the VAD pump to the aorta. The aorta is a large artery that carries oxygenated blood from your heart to the rest of your body.  Pump. The VAD pump is implanted inside the body.  Drive line (percutaneous lead). This tube connects the pump to the system controller.  System controller. The controller consists of batteries and electronics which control the VAD. The VAD is powered by either batteries or an electrical wall outlet.  INSPECTION OF THE VAD  Inspect the VAD tubing exit site once a day or as told by your caregiver. If there is redness, swelling, drainage, warmth, or a foul odor at the VAD tubing exit site, call your caregiver or VAD coordinator.  Record the following on your VAD flow sheet daily.  Your temperature.  Your blood pressure.  Your weight.  The VAD speed (rpm).  The VAD pulsatility index (PI).  The VAD power.  The VAD flow (L/min). VAD BANDAGE CHANGES Change your VAD bandage (dressing) as directed by your caregiver. You will be taught how to change the dressing at the hospital before you go home. A VAD dressing change requires the removal of the current dressing, the replacement of a dressing, and the securement of the VAD tubing.  Remove the current dressing. Use an unsterile (clean) technique to remove the current dressing. Follow the steps below to remove the current VAD dressing: 1. Wear a  mask. 2. Wash your hands. 3. Put on unsterile gloves. 4. Remove the current dressing. 5. Remove your gloves. Wash your hands again. 6. Do not touch the tubing at the VAD tubing exit site after the dressing has been taken off.  Replace the VAD dressing. Use a germ-free (sterile) technique. Follow the steps below to replace the VAD dressing. 1. Continue wearing the mask. 2. Set out the supplies using sterile technique. 3. Put on sterile gloves. 4. Clean the VAD exit site using the product your caregiver has instructed you to use. 5. Gently dry the exit site with dry sterile gauze. 6. Apply a slit gauze dressing (drain sponge) to the VAD tubing exit site. Cover with a 4-inch square (10 cm square) gauze pad. 7. Secure the dressing with tape or specified product as directed by your caregiver.  Secure the VAD tubing as directed by your caregiver. The VAD tubing should be secured to prevent:  Damage to the VAD tubing.  Irritation at the VAD tubing exit site.  Infection. LIVING WITH A VAD  Become familiar with your VAD. Be sure you:  Know all parts and functions of the VAD.  Understand the VAD alarm functions.  Are comfortable doing sterile dressing changes.  Know what to do in case of an emergency.  When leaving home, make sure you take:  A spare system controller.  Extra VAD batteries.  Do not smoke. Avoid places where you will be exposed to secondhand smoke. Smoking and secondhand smoke cause your arteries to  tighten. This decreases blood flow and will make your pump work harder.  Eat healthy foods. Eat small, frequent meals.  Ask your caregiver about the use of vitamins and mineral supplements.  Talk to your caregiver about drinking alcohol.  Avoid being active in very hot or cold weather.  Balance activity with periods of rest.  Resume sexual activity as advised by your caregiver.  Avoid touching televisions or computer screens. This is a safety precaution to avoid  static electrical shocks. Use an antistatic spray as needed.  Do not vacuum.  Schedule and attend all follow-up appointments as directed by your caregiver. It is important to keep all your appointments.  Schedule and attend all follow-up laboratory appointments as directed by your caregiver. While taking anticoagulants, you will need to have frequent blood samples to measure the clotting time of your blood.  Participate in cardiac rehabilitation as directed by your caregiver.  Obtain ongoing support.  If you require ambulance or emergency services, a trained caregiver should stay with you at all times.  Chest compressions should not be performed when you have a VAD. RESTRICTIONS WITH A VAD It is important to follow your caregiver's instructions on what you can and cannot do regarding activities. Guidelines for restricted activities include:  Do not expose the VAD to water. This can cause VAD pump failure.  Do not take baths or go swimming.  You may take showers as directed by your caregiver. You must use a shower bag to cover the VAD equipment. Your caregiver will show you how to cover the VAD parts. Do not get the connection between the percutaneous lead and the system controller wet.  Do not play contact or physically challenging sports.  Do not jump up and down excessively.  Do not lift more than 10 lb (4.5 kg) for the first 6 weeks or as directed by your caregiver.  Do not sleep on your stomach.  Do not drive or operate heavy machinery. TRAVELING WITH A VAD  Notify your VAD coordinator before you travel and let him or her know:  Your travel dates.  Your length of travel.  Identify the closest VAD centers to your travel destinations.  Notify those centers of the dates you will be in the area.  Carry names and telephone numbers of emergency contacts.  Get permission from your caregiver before you travel by air or train. You will need to notify airline or train  security that you have a VAD. To pass through security:  You will need a letter from your caregiver explaining your medical condition and VAD equipment.  Do not go through the X-ray equipment. Security may need to inspect the external VAD parts. CALL YOUR HEART FAILURE SPECIALIST OR VAD COORDINATOR IF:  The VAD flow rate, PI, and power are not at rates as instructed by your caregiver.  You have swelling in your ankles or feet. This may be a sign of water retention.  You gain 3 pounds (1.36 kg) or more in 1 day. Or, follow your caregiver's advice regarding weight gain.  You have any type of chest pain.  You have redness, pain, warmth, foul odor, or drainage at the VAD tube exit site.  You notice changes in how the VAD feels or works.  You have any VAD concerns.  You have persistent bleeding.  You have an increase in bruising.  You have abdominal pain.  You have nausea or vomiting.  You have a fever. CALL YOUR LOCAL EMERGENCY SERVICE AND VAD  COORDINATOR Call your local emergency service (911 in [place] and VAD coordinator immediately if the pump is not functioning properly. Pump malfunctioning includes:  Loss of power to the pump.  Any unexplained or unusual alarms.  Broken wires.  Damage to the pump motor or system controller.  Red heart alarm. This is an alarm that signals VAD pump failure. SEEK IMMEDIATE MEDICAL CARE IF:  You have shortness of breath or difficulty breathing.  You have chest pain, or pain that spreads to your arm, neck, jaw, back, or abdomen.  You have blood in your sputum, vomit, or stool. This information is not intended to replace advice given to you by your health care provider. Make sure you discuss any questions you have with your health care provider. Document Released: 11/26/2009 Document Revised: 09/22/2014 Document Reviewed: 07/16/2015 Elsevier Interactive Patient Education  2017 [employer].

## 2016-08-28 NOTE — Patient Instructions (Signed)
[facility] Discharge Instructions for Patients Receiving Chemotherapy  Today you received the following chemotherapy agents:  Irinotecan, Leucovorin, Avastin and 5FU  To help prevent nausea and vomiting after your treatment, we encourage you to take your nausea medication as ordered per MD.   If you develop nausea and vomiting that is not controlled by your nausea medication, call the clinic.   BELOW ARE SYMPTOMS THAT SHOULD BE REPORTED IMMEDIATELY:  *FEVER GREATER THAN 100.5 F  *CHILLS WITH OR WITHOUT FEVER  NAUSEA AND VOMITING THAT IS NOT CONTROLLED WITH YOUR NAUSEA MEDICATION  *UNUSUAL SHORTNESS OF BREATH  *UNUSUAL BRUISING OR BLEEDING  TENDERNESS IN MOUTH AND THROAT WITH OR WITHOUT PRESENCE OF ULCERS  *URINARY PROBLEMS  *BOWEL PROBLEMS  UNUSUAL RASH Items with * indicate a potential emergency and should be followed up as soon as possible.  Feel free to call the clinic you have any questions or concerns. The clinic phone number is [phone].  Please show the [place] at check-in to the Emergency Department and triage nurse.

## 2016-08-29 ENCOUNTER — Other Ambulatory Visit: Payer: Self-pay | Admitting: Hematology

## 2016-08-29 LAB — CEA: CEA: 139.4 ng/mL — ABNORMAL HIGH (ref 0.0–4.7)

## 2016-08-30 ENCOUNTER — Ambulatory Visit (HOSPITAL_BASED_OUTPATIENT_CLINIC_OR_DEPARTMENT_OTHER): Payer: BLUE CROSS/BLUE SHIELD

## 2016-08-30 VITALS — BP 133/94 | HR 65 | Temp 99.4°F | Resp 18

## 2016-08-30 DIAGNOSIS — Z452 Encounter for adjustment and management of vascular access device: Secondary | ICD-10-CM

## 2016-08-30 DIAGNOSIS — C187 Malignant neoplasm of sigmoid colon: Secondary | ICD-10-CM

## 2016-08-30 MED ORDER — HEPARIN SOD (PORK) LOCK FLUSH 100 UNIT/ML IV SOLN
500.0000 [IU] | Freq: Once | INTRAVENOUS | Status: AC | PRN
  Administered 2016-08-30: 500 [IU]
  Filled 2016-08-30: qty 5

## 2016-08-30 MED ORDER — SODIUM CHLORIDE 0.9% FLUSH
10.0000 mL | INTRAVENOUS | Status: DC | PRN
  Administered 2016-08-30: 10 mL
  Filled 2016-08-30: qty 10

## 2016-09-05 ENCOUNTER — Other Ambulatory Visit: Payer: Self-pay | Admitting: Medical Oncology

## 2016-09-05 DIAGNOSIS — C50511 Malignant neoplasm of lower-outer quadrant of right female breast: Secondary | ICD-10-CM

## 2016-09-05 DIAGNOSIS — Z170 Estrogen receptor positive status [ER+]: Principal | ICD-10-CM

## 2016-09-05 MED ORDER — LETROZOLE 2.5 MG PO TABS: 2.5000 mg | ORAL_TABLET | Freq: Every day | ORAL | 2 refills | Status: DC

## 2016-09-10 ENCOUNTER — Ambulatory Visit: Payer: BLUE CROSS/BLUE SHIELD | Admitting: Hematology

## 2016-09-10 ENCOUNTER — Telehealth: Payer: Self-pay | Admitting: Hematology

## 2016-09-10 ENCOUNTER — Other Ambulatory Visit: Payer: BLUE CROSS/BLUE SHIELD

## 2016-09-10 NOTE — Telephone Encounter (Signed)
Added appointments for 1/11 and 1/13. Patient to get new schedule at tomorrow's visit.

## 2016-09-11 ENCOUNTER — Other Ambulatory Visit (HOSPITAL_BASED_OUTPATIENT_CLINIC_OR_DEPARTMENT_OTHER): Payer: BLUE CROSS/BLUE SHIELD

## 2016-09-11 ENCOUNTER — Ambulatory Visit (HOSPITAL_BASED_OUTPATIENT_CLINIC_OR_DEPARTMENT_OTHER): Payer: BLUE CROSS/BLUE SHIELD

## 2016-09-11 VITALS — BP 130/80 | HR 68 | Temp 99.1°F | Resp 17

## 2016-09-11 DIAGNOSIS — Z5112 Encounter for antineoplastic immunotherapy: Secondary | ICD-10-CM

## 2016-09-11 DIAGNOSIS — Z5111 Encounter for antineoplastic chemotherapy: Secondary | ICD-10-CM | POA: Diagnosis not present

## 2016-09-11 DIAGNOSIS — C787 Secondary malignant neoplasm of liver and intrahepatic bile duct: Secondary | ICD-10-CM

## 2016-09-11 DIAGNOSIS — C187 Malignant neoplasm of sigmoid colon: Secondary | ICD-10-CM | POA: Diagnosis not present

## 2016-09-11 DIAGNOSIS — C50511 Malignant neoplasm of lower-outer quadrant of right female breast: Secondary | ICD-10-CM

## 2016-09-11 DIAGNOSIS — Z170 Estrogen receptor positive status [ER+]: Secondary | ICD-10-CM

## 2016-09-11 LAB — COMPREHENSIVE METABOLIC PANEL
ALK PHOS: 59 U/L (ref 40–150)
ALT: 24 U/L (ref 0–55)
AST: 30 U/L (ref 5–34)
Albumin: 3.1 g/dL — ABNORMAL LOW (ref 3.5–5.0)
Anion Gap: 11 mEq/L (ref 3–11)
BUN: 10.9 mg/dL (ref 7.0–26.0)
CHLORIDE: 103 meq/L (ref 98–109)
CO2: 25 meq/L (ref 22–29)
Calcium: 9.4 mg/dL (ref 8.4–10.4)
Creatinine: 0.8 mg/dL (ref 0.6–1.1)
EGFR: 88 mL/min/{1.73_m2} — AB (ref >90)
GLUCOSE: 107 mg/dL (ref 70–140)
POTASSIUM: 3.6 meq/L (ref 3.5–5.1)
SODIUM: 139 meq/L (ref 136–145)
Total Bilirubin: 0.56 mg/dL (ref 0.20–1.20)
Total Protein: 7.7 g/dL (ref 6.4–8.3)

## 2016-09-11 LAB — CBC WITH DIFFERENTIAL/PLATELET
BASO%: 0.8 % (ref 0.0–2.0)
BASOS ABS: 0.1 10*3/uL (ref 0.0–0.1)
EOS ABS: 0.1 10*3/uL (ref 0.0–0.5)
EOS%: 1.2 % (ref 0.0–7.0)
HCT: 37.8 % (ref 34.8–46.6)
HGB: 12.8 g/dL (ref 11.6–15.9)
LYMPH%: 41.9 % (ref 14.0–49.7)
MCH: 31.8 pg (ref 25.1–34.0)
MCHC: 33.7 g/dL (ref 31.5–36.0)
MCV: 94.3 fL (ref 79.5–101.0)
MONO#: 0.6 10*3/uL (ref 0.1–0.9)
MONO%: 8.9 % (ref 0.0–14.0)
NEUT#: 3.2 10*3/uL (ref 1.5–6.5)
NEUT%: 47.2 % (ref 38.4–76.8)
Platelets: 261 10*3/uL (ref 145–400)
RBC: 4.01 10*6/uL (ref 3.70–5.45)
RDW: 17.5 % — ABNORMAL HIGH (ref 11.2–14.5)
WBC: 6.7 10*3/uL (ref 3.9–10.3)
lymph#: 2.8 10*3/uL (ref 0.9–3.3)

## 2016-09-11 LAB — UA PROTEIN, DIPSTICK - CHCC: Protein, ur: <30 mg/dL

## 2016-09-11 MED ORDER — FLUOROURACIL CHEMO INJECTION 2.5 GM/50ML
400.0000 mg/m2 | Freq: Once | INTRAVENOUS | Status: AC
  Administered 2016-09-11: 1000 mg via INTRAVENOUS
  Filled 2016-09-11: qty 20

## 2016-09-11 MED ORDER — DEXAMETHASONE SODIUM PHOSPHATE 10 MG/ML IJ SOLN
10.0000 mg | Freq: Once | INTRAMUSCULAR | Status: AC
  Administered 2016-09-11: 10 mg via INTRAVENOUS

## 2016-09-11 MED ORDER — SODIUM CHLORIDE 0.9% FLUSH
10.0000 mL | INTRAVENOUS | Status: DC | PRN
  Filled 2016-09-11: qty 10

## 2016-09-11 MED ORDER — SODIUM CHLORIDE 0.9 % IV SOLN
2400.0000 mg/m2 | INTRAVENOUS | Status: DC
  Administered 2016-09-11: 5850 mg via INTRAVENOUS
  Filled 2016-09-11: qty 117

## 2016-09-11 MED ORDER — HEPARIN SOD (PORK) LOCK FLUSH 100 UNIT/ML IV SOLN
500.0000 [IU] | Freq: Once | INTRAVENOUS | Status: DC | PRN
  Filled 2016-09-11: qty 5

## 2016-09-11 MED ORDER — ATROPINE SULFATE 1 MG/ML IJ SOLN
INTRAMUSCULAR | Status: AC
  Filled 2016-09-11: qty 1

## 2016-09-11 MED ORDER — IRINOTECAN HCL CHEMO INJECTION 100 MG/5ML
180.0000 mg/m2 | Freq: Once | INTRAVENOUS | Status: AC
  Administered 2016-09-11: 440 mg via INTRAVENOUS
  Filled 2016-09-11: qty 20

## 2016-09-11 MED ORDER — ATROPINE SULFATE 1 MG/ML IJ SOLN
0.5000 mg | Freq: Once | INTRAMUSCULAR | Status: AC | PRN
  Administered 2016-09-11: 0.5 mg via INTRAVENOUS

## 2016-09-11 MED ORDER — LEUCOVORIN CALCIUM INJECTION 350 MG
400.0000 mg/m2 | Freq: Once | INTRAVENOUS | Status: AC
  Administered 2016-09-11: 976 mg via INTRAVENOUS
  Filled 2016-09-11: qty 48.8

## 2016-09-11 MED ORDER — SODIUM CHLORIDE 0.9 % IV SOLN
Freq: Once | INTRAVENOUS | Status: AC
  Administered 2016-09-11: 12:00:00 via INTRAVENOUS

## 2016-09-11 MED ORDER — PALONOSETRON HCL INJECTION 0.25 MG/5ML
0.2500 mg | Freq: Once | INTRAVENOUS | Status: AC
  Administered 2016-09-11: 0.25 mg via INTRAVENOUS

## 2016-09-11 MED ORDER — PALONOSETRON HCL INJECTION 0.25 MG/5ML
INTRAVENOUS | Status: AC
  Filled 2016-09-11: qty 5

## 2016-09-11 MED ORDER — SODIUM CHLORIDE 0.9 % IV SOLN
5.0000 mg/kg | Freq: Once | INTRAVENOUS | Status: AC
  Administered 2016-09-11: 650 mg via INTRAVENOUS
  Filled 2016-09-11: qty 14

## 2016-09-11 MED ORDER — DEXAMETHASONE SODIUM PHOSPHATE 10 MG/ML IJ SOLN
INTRAMUSCULAR | Status: AC
  Filled 2016-09-11: qty 1

## 2016-09-11 NOTE — Patient Instructions (Signed)
[facility] Discharge Instructions for Patients Receiving Chemotherapy  Today you received the following chemotherapy agents:  Irinotecan, Leucovorin, Avastin and 5FU  To help prevent nausea and vomiting after your treatment, we encourage you to take your nausea medication as ordered per MD.   If you develop nausea and vomiting that is not controlled by your nausea medication, call the clinic.   BELOW ARE SYMPTOMS THAT SHOULD BE REPORTED IMMEDIATELY:  *FEVER GREATER THAN 100.5 F  *CHILLS WITH OR WITHOUT FEVER  NAUSEA AND VOMITING THAT IS NOT CONTROLLED WITH YOUR NAUSEA MEDICATION  *UNUSUAL SHORTNESS OF BREATH  *UNUSUAL BRUISING OR BLEEDING  TENDERNESS IN MOUTH AND THROAT WITH OR WITHOUT PRESENCE OF ULCERS  *URINARY PROBLEMS  *BOWEL PROBLEMS  UNUSUAL RASH Items with * indicate a potential emergency and should be followed up as soon as possible.  Feel free to call the clinic you have any questions or concerns. The clinic phone number is [phone].  Please show the [place] at check-in to the Emergency Department and triage nurse.

## 2016-09-11 NOTE — Progress Notes (Signed)
Blood return noted before and after 5FU push and before 5FU pump connection.

## 2016-09-13 ENCOUNTER — Ambulatory Visit (HOSPITAL_BASED_OUTPATIENT_CLINIC_OR_DEPARTMENT_OTHER): Payer: BLUE CROSS/BLUE SHIELD

## 2016-09-13 VITALS — BP 155/87 | HR 62 | Temp 98.0°F | Resp 16

## 2016-09-13 DIAGNOSIS — Z452 Encounter for adjustment and management of vascular access device: Secondary | ICD-10-CM

## 2016-09-13 DIAGNOSIS — C187 Malignant neoplasm of sigmoid colon: Secondary | ICD-10-CM

## 2016-09-13 MED ORDER — SODIUM CHLORIDE 0.9% FLUSH
10.0000 mL | INTRAVENOUS | Status: DC | PRN
  Administered 2016-09-13: 10 mL
  Filled 2016-09-13: qty 10

## 2016-09-13 MED ORDER — HEPARIN SOD (PORK) LOCK FLUSH 100 UNIT/ML IV SOLN
500.0000 [IU] | Freq: Once | INTRAVENOUS | Status: AC | PRN
  Administered 2016-09-13: 500 [IU]
  Filled 2016-09-13: qty 5

## 2016-09-24 NOTE — Progress Notes (Signed)
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, estrogen receptor positive ([place])  Cancer of sigmoid colon ([place]) - Plan: DISCONTINUED: 0.9 %  sodium chloride infusion, DISCONTINUED: atropine injection 0.5 mg, DISCONTINUED: dexamethasone (DECADRON) injection 10 mg, DISCONTINUED: palonosetron (ALOXI) injection 0.25 mg, DISCONTINUED: bevacizumab (AVASTIN) 650 mg in sodium chloride 0.9 % 100 mL chemo infusion, DISCONTINUED: irinotecan (CAMPTOSAR) 440 mg in dextrose 5 % 500 mL chemo infusion, DISCONTINUED: leucovorin 976 mg in dextrose 5 % 250 mL infusion, DISCONTINUED: fluorouracil (ADRUCIL) chemo injection 1,000 mg, DISCONTINUED: fluorouracil (ADRUCIL) 5,850 mg in sodium chloride 0.9 % 133 mL chemo infusion  Essential hypertension, benign  Personal history of venous thrombosis and embolism  Obesity, Class III, BMI 40-49.9 (morbid obesity) ([place])  PROBLEM LIST 1. Left breast DCIS diagnosed in 2006 and 2010, status post lumpectomy. 2. pT2N0M0 stage I colon cancer, s/p partial colectomy on 03/09/2014  3. PE in 12/2013 when she was diagnosed with colon cancer. 4. 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 [facility])   Staging form: Breast, AJCC 7th Edition     Clinical stage from 02/01/2015: Stage IA (T1a, N0, M0) - Signed by [name], MD on 06/04/2015     Pathologic stage from 11/10/2015: Stage IA (T1b, N0, M0) - Signed by [name], MD on 12/08/2015 Cancer of sigmoid colon [facility])   Staging form: Colon and Rectum, AJCC 7th Edition     Clinical: Stage I (T2, N0, M0) - Unsigned     Breast cancer of lower-outer quadrant of right female breast ([place])   2006 Cancer Diagnosis    Left breast DCIS diagnosed in 2006 and  2010, status post lumpectomy.      01/07/2014 Initial Diagnosis    Breast cancer      01/18/2015 Mammogram    52m mass in lower outer quadrant of right breast       02/01/2015 Initial Biopsy    (R) breast mass biopsy showed invasive ductal carcinoma & DCIS, grade 1-2.       02/01/2015 Receptors her2    ER 90%+, PR 10%+, Ki67 10%, HER2 negative (ratio 1.09)       11/07/2015 Surgery    (R) breast lumpectomy and sentinel lymph node biopsy ([name]      11/07/2015 Pathology Results    (R) breast lumpectomy showed invasive ductal carcinoma, grade 2, 1 cm, low-grade DCIS, negative margins, 3 sentinel lymph nodes were negative. LVI (-). HER2 repeated and remains negative (ratio 1.29).        11/07/2015 Pathologic Stage    pT1b,pN0: Stage IA       11/07/2015 Oncotype testing    RS 22, which predicts a year risk of distant recurrence with tamoxifen alone 14% (intermediate-risk). Adjuvant chemo was not offered      02/21/2016 - 04/10/2016 Radiation Therapy    Adjuvant breast radiation. ([name]). Right breast: 50.4 Gy in 28 fractions.  Right breast boost: 12 Gy in 6 fractions.      05/2016 -  Anti-estrogen oral therapy    Femara 2.5 mg daily.        [place]   03/04/2014 Initial Diagnosis    Cancer of sigmoid colon      03/09/2014 Pathology Results  G1 invasive adenocarcinoma, no lymph-Vascular invasion or Peri-neural invasion: Absent. MMR normal, MSI stable.       03/09/2014 Surgery    partial colectomy, negative margins.       11/10/2014 Relapse/Recurrence    biopsy confirmed oligo liver recurrence       11/10/2014 Imaging    PET showed two small ares of hypermetabolism in the right lobe of the liver, no other distant metastasis.       11/30/2014 Imaging    abdomen MRI showed a new enhancing lesion which corresponds to an area of abnormal hyper metabolism on recent PET-CT. Stable enlarged portal caval lymph node.       01/02/2015 Pathology Results     Liver biopsy showed metastatic adenocarcinoma, IHC (+) for CK20, CDX-2, (-) for TTF-1 and ER      01/02/2015 Miscellaneous    liver biopsy KRAS mutation (+)       02/14/2015 Imaging    CT showed:  the small metastatic right hepatic lobe liver lesion is not identified for certain on this examination. No new lesions. Stable small cysts in segment 4 a.       03/05/2015 - 04/16/2015 Chemotherapy    CAPEOX (capecitabine 2500 mg twice daily Day 1-14, oxaliplatin 1 30 mg/m on day 1, every 21 days, S/P 3 cycles       08/24/2015 Procedure    CT-guided oligo liver metastasis microwave ablation by Dr. [name]      06/17/2016 Imaging    CT of the chest/abd/pelvis with contrast 1. Several new small pulmonary nodules worrisome for metastatic disease. 2. New large area of probable infiltrating recurrent tumor in the right hepatic lobe surrounding the prior ablation site. 3. Small supraclavicular lymph nodes.  Attention on future studies. 4. Stable surgical changes involving both breasts. No breast masses are identified. 5. Stable bilateral adrenal gland nodules. 6. Stable upper abdominal and right-sided retroperitoneal lymph nodes.      07/08/2016 PET scan    1. Large mass in the right lobe of the liver demonstrates diffuse hypermetabolism, compatible with recurrent metastatic disease in the liver 2. Multiple small pulmonary nodules are very similar to the recent Chest CT 06/17/16.  3. No other findings of metastatic disease elsewhere in the neck, chest, abdomen, or pelvis. 4. There is some low-level hypermetabolic activity adjacent to the surgical clips in the lateral aspect of the right breast at the site of prior lumpectomy.       07/29/2016 Pathology Results    Liver biopsy confirmed metastatic colon cancer       07/31/2016 -  Chemotherapy    FOLFIRI every 2 weeks, Avastin added from cycle 2         CURRENT TREATMENT:  Letrozole 2.5 mg once daily, started in September 2017  INTERVAL  HISTORY:  Veronica Reyes 61 y.o. female with a history of recurrent left breast cancer, colon cancer s/p laparoscopic-assisted partial colectomy (03/09/2014), PE (01/06/2014), and right breast cancer (01/2015),  presents to clinic for follow up.   She is doing well overall. She has been tolerating chemotherapy well, her main complaint is her metallic taste change, is slightly low appetite. She is eating well. No significant diarrhea, no fever or chills. Mild fatigue, she sleeps more than before, but able to function well at home   MEDICAL HISTORY: Past Medical History:  Diagnosis Date  . Anxiety   . Breast CA ([place])    radiation and surgery-lt.  . Cancer ([place])    left  breast cancer x2  . Colon cancer ([place])    Dr. Burr Medico seeing monthly  . Hypertension   . Liver lesion   . Pulmonary embolism ([place])    "blood clot in lungs" -dx. 4'15 with CT Chest. On Xarelto  . Radiation 01/22/10-03/12/10   left whole breast 4500 cGy, upper aspect boosted to 6120 cGy  . Radiation 02/21/16 - 04/10/16   right breast 50.4 Gy, right breast boost 12 Gy  . Shortness of breath    sob with exertion. dx. with Pulmonary emboli 4'15 ,tx. Xarelto,Lovenox used for [employer].    ALLERGIES:  is allergic to tramadol.  MEDICATIONS:  Allergies as of 09/25/2016      Reactions   Tramadol Other (See Comments)   Dizziness      Medication List       Accurate as of 09/25/16  7:56 PM. Always use your most recent med list.          acetaminophen-codeine 300-60 MG tablet Commonly known as:  TYLENOL #4 Take 1 tablet by mouth every 8 (eight) hours as needed. for pain   amLODipine 2.5 MG tablet Commonly known as:  NORVASC Take 2.5 mg by mouth daily.   ferrous sulfate 325 (65 FE) MG tablet Take 325 mg by mouth daily with breakfast. Pt takes  4 times per week.   hydrochlorothiazide 25 MG tablet Commonly known as:  HYDRODIURIL Take 1 tablet (25 mg total) by mouth every morning.   HYDROcodone-acetaminophen 5-325 MG  tablet Commonly known as:  NORCO Take 1 tablet by mouth every 6 (six) hours as needed for moderate pain.   letrozole 2.5 MG tablet Commonly known as:  FEMARA Take 1 tablet (2.5 mg total) by mouth daily.   lidocaine-prilocaine cream Commonly known as:  EMLA Apply 1 application topically as needed.   metoprolol 50 MG tablet Commonly known as:  LOPRESSOR Take 1 tablet (50 mg total) by mouth 2 (two) times daily.   ondansetron 8 MG tablet Commonly known as:  ZOFRAN Take 1 tablet (8 mg total) by mouth 2 (two) times daily as needed for refractory nausea / vomiting. Start on day 3 after chemotherapy.   potassium chloride SA 20 MEQ tablet Commonly known as:  K-DUR,KLOR-CON Take 1 tablet (20 mEq total) by mouth daily.   prochlorperazine 10 MG tablet Commonly known as:  COMPAZINE Take 1 tablet (10 mg total) by mouth every 6 (six) hours as needed (NAUSEA).   XARELTO 20 MG Tabs tablet Generic drug:  rivaroxaban Take 1 tablet by mouth daily with supper   zolpidem 5 MG tablet Commonly known as:  AMBIEN Take 1 tablet (5 mg total) by mouth at bedtime as needed for sleep.       SURGICAL HISTORY:  Past Surgical History:  Procedure Laterality Date  . BREAST LUMPECTOMY WITH RADIOACTIVE SEED AND SENTINEL LYMPH NODE BIOPSY Right 11/07/2015   Procedure: BREAST LUMPECTOMY WITH RADIOACTIVE SEED AND SENTINEL LYMPH NODE BIOPSY;  Surgeon: Stark Klein, MD;  Location: [facility];  Service: General;  Laterality: Right;  . BREAST SURGERY     '11- Left breast lumpectomy  . CESAREAN SECTION    . CHOLECYSTECTOMY     Laparoscopic 20 yrs ago  . COLON SURGERY  03-09-14   partial colectomy for cancer  . IR GENERIC HISTORICAL  07/29/2016   IR FLUORO GUIDE PORT INSERTION RIGHT 07/29/2016 Sandi Mariscal, MD [facility]  . IR GENERIC HISTORICAL  07/29/2016   IR US GUIDE VASC ACCESS RIGHT 07/29/2016  Sandi Mariscal, MD [facility]  . IR GENERIC HISTORICAL  07/29/2016   IR US GUIDE BX ASP/DRAIN  07/29/2016 [facility]  . PARTIAL COLECTOMY N/A 03/09/2014   Procedure: LAPAROSCOPIC ASSISTED PARTIAL COLECTOMY, splenic flexure mobilization;  Surgeon: Leighton Ruff, MD;  Location: [facility];  Service: General;  Laterality: N/A;  . RADIOFREQUENCY ABLATION Right 08/24/2015   Procedure: MICROWAVE ABLATION RIGHT LIVER LOBE ;  Surgeon: Corrie Mckusick, DO;  Location: [facility];  Service: Anesthesiology;  Laterality: Right;    REVIEW OF SYSTEMS:   Constitutional: Denies fevers, chills or abnormal weight loss Eyes: Denies blurriness of vision Ears, nose, mouth, throat, and face: Denies mucositis or sore throat Respiratory: Denies cough, dyspnea or wheezes Cardiovascular: Denies palpitation, chest discomfort or lower extremity swelling Gastrointestinal:  Denies heartburn or change in bowel habits. (+) Nausea Skin: Denies abnormal skin rashes Musculoskeletal: (+) intermittent right-sided flank pain. Lymphatics: Denies new lymphadenopathy or easy bruising Neurological:Denies numbness, tingling or new weaknesses Behavioral/Psych: Mood is stable, no new changes  All other systems were reviewed with the patient and are negative.    PHYSICAL EXAMINATION: ECOG PERFORMANCE STATUS: 1 Blood pressure (!) 146/83, pulse 62, temperature 98.4 F (36.9 C), temperature source Oral, resp. rate 16, height '5\' 6"'  (1.676 m), weight 274 lb 11.2 oz (124.6 kg), last menstrual period 01/23/2014, SpO2 100 %. GENERAL:alert, no distress and comfortable; well developed and obese. Easily mobile to exam table.  SKIN: skin color, texture, turgor are normal, no rashes or significant lesions EYES: normal, Conjunctiva are pink and non-injected, sclera clear OROPHARYNX:no exudate, no erythema and lips, buccal mucosa, and tongue normal  NECK: supple, thyroid normal size, non-tender, without nodularity LYMPH:  no palpable lymphadenopathy in the cervical, axillary or supraclavicular LUNGS: clear to auscultation with normal breathing  effort, no wheezes or rhonchi HEART: regular rate & rhythm and no murmurs and no lower extremity edema ABDOMEN:abdomen soft, non-tender and normal bowel sounds; incision healing with signs of infection.  Mild tenderness at the  Right flank area, no abdominal tenderness. No local megaly Musculoskeletal:no cyanosis of digits and no clubbing, (+) tenderness at right flank area NEURO: alert & oriented x 3 with fluent speech, no focal motor/sensory deficits   Labs:  CBC Latest Ref Rng & Units 09/25/2016 09/11/2016 08/28/2016  WBC 3.9 - 10.3 10e3/uL 5.6 6.7 6.4  Hemoglobin 11.6 - 15.9 g/dL 12.7 12.8 12.5  Hematocrit 34.8 - 46.6 % 37.8 37.8 37.8  Platelets 145 - 400 10e3/uL 264 261 283    CMP Latest Ref Rng & Units 09/25/2016 09/11/2016 08/28/2016  Glucose 70 - 140 mg/dl 106 107 105  BUN 7.0 - 26.0 mg/dL 10.5 10.9 7.3  Creatinine 0.6 - 1.1 mg/dL 0.8 0.8 0.8  Sodium 136 - 145 mEq/L 139 139 139  Potassium 3.5 - 5.1 mEq/L 3.5 3.6 3.4(L)  Chloride 101 - 111 mmol/L - - -  CO2 22 - 29 mEq/L '27 25 25  ' Calcium 8.4 - 10.4 mg/dL 10.0 9.4 9.7  Total Protein 6.4 - 8.3 g/dL 7.5 7.7 7.9  Total Bilirubin 0.20 - 1.20 mg/dL 0.55 0.56 0.51  Alkaline Phos 40 - 150 U/L 51 59 67  AST 5 - 34 U/L 28 30 34  ALT 0 - 55 U/L '22 24 27   ' CEA  01/18/2014: 0.8 03/01/2015: 1.3 01/31/2016: 4.8 05/28/2016: 48 07/31/2016: 173.7 08/28/16: 139 09/25/2016: 43  PATHOLOGY RESULT Diagnosis 01/02/2015 Liver, needle/core biopsy, right - POSITIVE FOR METASTATIC ADENOCARCINOMA. - SEE COMMENT. Microscopic Comment Immunohistochemical stains are performed. The  tumor is positive for cytokeratin 20 and CDX-2. Cytokeratin 7 stains background bile ducts but is negative within the tumor. The tumor is negative for TTF-1 and estrogen receptor. The morphology coupled with the staining pattern is consistent with metastatic adenocarcinoma of colorectal primary source.   Diagnosis 11/07/2015 1. Breast, lumpectomy, Right - INVASIVE DUCTAL  CARCINOMA, GRADE 2, SPANNING 1 CM. - DUCTAL CARCINOMA IN SITU, LOW GRADE. - RESECTION MARGINS ARE NEGATIVE FOR INVASIVE CARCINOMA. - DUCTAL CARCINOMA IN SITU COMES TO WITHIN 0.3 CM OF THE POSTERIOR MARGIN. - BIOPSY SITE. - SEE ONCOLOGY TABLE. 2. Lymph node, sentinel, biopsy, right axillary #1 - ONE OF ONE LYMPH NODES NEGATIVE FOR CARCINOMA (0/1). 3. Lymph node, sentinel, biopsy, right axillary #2 - ONE OF ONE LYMPH NODES NEGATIVE FOR CARCINOMA (0/1). 4. Lymph node, sentinel, biopsy, right axillary #3 - ONE OF ONE LYMPH NODES NEGATIVE FOR CARCINOMA (0/1). Microscopic Comment 1. BREAST, INVASIVE TUMOR, WITH LYMPH NODES PRESENT Specimen, including laterality and lymph node sampling (sentinel, non-sentinel): Right breast lump and right axillary sentinel lymph nodes. Procedure: Right breast lumpectomy and right axillary sentinel lymph node biopsy. Histologic type: Invasive ductal carcinoma. Grade: 2. Tubule formation: 3 Nuclear pleomorphism: 2 Mitotic:1 Tumor size (gross measurement): 1 cm Margins: Invasive, distance to closest margin: 0.2 cm (posterior). In-situ, distance to closest margin: 0.3 cm (posterior, focal). If margin positive, focally or broadly: N/A. Lymphovascular invasion: Not identified. Ductal carcinoma in situ: Present. Grade: I Extensive intraductal component: No. Lobular neoplasia: Not identified. Tumor focality: Unifocal. Treatment effect: N/A. Extent of tumor: Confined to breast parenchyma. Lymph nodes: Examined: 3 Sentinel 0 Non-sentinel 3 Total Lymph nodes with metastasis: 0 Isolated tumor cells (< 0.2 mm): 0 Micrometastasis: (> 0.2 mm and < 2.0 mm): 0 Macrometastasis: (> 2.0 mm): 0 Extracapsular extension: N/A. Breast prognostic profile: Performed on biopsy ([id number]), see below. Her2 will be repeated on the current specimen. Estrogen receptor: Positive, 90%, strong staining intensity. Progesterone receptor: Positive, 10%, strong staining  intensity. Her 2 neu: Negative. Ki-67: 10%. Non-neoplastic breast: Fibrocystic change and usual ductal hyperplasia. Biopsy site. TNM: pT1b, pN0 Comments: None  ONCOTYPE DX: RS 22, which predicts a year risk of distant recurrence with tamoxifen alone 14%  Results: IMMUNOHISTOCHEMICAL AND MORPHOMETRIC ANALYSIS BY THE AUTOMATED CELLULAR IMAGING SYSTEM (ACIS) Estrogen Receptor: 90%, POSITIVE, STRONG STAINING INTENSITY (PERFORMED MANUALLY) Progesterone Receptor: 10%, POSITIVE, STRONG STAINING INTENSITY (PERFORMED MANUALLY) Proliferation Marker Ki67: 10% 1. FLUORESCENCE IN-SITU HYBRIDIZATION Results: HER2 - NEGATIVE RATIO OF HER2/CEP17 SIGNALS 1.29 AVERAGE HER2 COPY NUMBER PER CELL 1.80   Diagnosis 07/29/2016 Liver, needle/core biopsy, posterior of right lobe METASTATIC ADENOCARCINOMA, CONSISTENT WITH COLONIC PRIMARY. Microscopic Comment The neoplasm stains positive for ck20, cdx 2, negative for ck7. The morphology and immunostaining pattern support the above diagnosis. RADIOLOGY STUDIES   Abdomen MRI w wo contrast 05/29/2015  IMPRESSION: 1. Mild degradation secondary to patient body habitus. 2. Isolated segment 6 subcapsular liver lesion is similar. No new or progressive disease identified. 3. Interpolar right renal lesion is favored to represent a complex/septated cyst and is not significantly changed in size.  CT guided tissue ablation by Dr. Earleen Newport 08/24/2015 IMPRESSION: Status post image guided microwave ablation of solitary metastasis in the right liver lobe in this patient with oligo metastatic colorectal cancer.   CT CAP with contrast 06/17/2016 IMPRESSION: 1. Several new small pulmonary nodules worrisome for metastatic disease. 2. New large area of probable infiltrating recurrent tumor in the right hepatic lobe surrounding the prior ablation site. 3. Small supraclavicular lymph nodes.  Attention on future studies. 4. Stable surgical changes involving both breasts.  No breast masses are identified. 5. Stable bilateral adrenal gland nodules. 6. Stable upper abdominal and right-sided retroperitoneal lymph nodes.Marland Kitchen  PET 07/08/2016 IMPRESSION: 1. Large mass in the right lobe of the liver demonstrates diffuse hypermetabolism, compatible with recurrent metastatic disease in the liver. 2. Multiple small pulmonary nodules are very similar to the recent chest CT 06/17/2016. These do not demonstrate hypermetabolism on the PET images, however, these lesions are well below the level of PET imaging. Given that these nodules are new compared to prior chest CT 12/18/2015, they are concerning for potential metastatic disease to the lungs, and continued attention on followup studies is recommended. 3. No other findings of metastatic disease elsewhere in the neck, chest, abdomen or pelvis. 4. There is some low-level hypermetabolic activity adjacent to the surgical clips in the lateral aspect of the right breast at the site of prior lumpectomy. This area has undergone interval involution over the several prior examinations, and this low-level activity is favored to be related to normal healing. Attention on follow-up imaging is recommended to ensure the stability or resolution of this finding. 5. Aortic atherosclerosis, in addition to 2 vessel coronary artery disease. Please note that although the presence of coronary artery calcium documents the presence of coronary artery disease, the severity of this disease and any potential stenosis cannot be assessed on this non-gated CT examination. Assessment for potential risk factor modification, dietary therapy or pharmacologic therapy may be warranted, if clinically indicated. 6. Additional incidental findings, as above.   ASSESSMENT: Veronica Reyes 61 y.o. female   1. Colon Cancer, Stage I (pT2N0), MMR normal, oligo recurrent disease in liver in 12/2014, KRAS mutation (+), liver and lung recurrence in  06/2016 -I previously reviewed the nature history of metastatic colon cancer, and the potential curative approach with chemotherapy followed by liver lesion ablation or resection. However, more likely, this is not curable disease. -She received 3 cycles of [place]ox, does not want to proceed with fourth cycle due to the moderate side effects from previous chemotherapy treatment. She declined liver mass resection, and underwent liver ablation by interventional radiologist Dr. Earleen Newport in 08/2015. - I previously reviewed her restaging CT scan from 06/17/2016, which unfortunately showed new area of infiltrative disease as the previous ablated liver lesion, likely recurrence. She also developed several new pulmonary nodules, small, concerning for metastatic disease. -Her CEA level previously increased significantly, most consistent with cancer progression.  -We discussed her liver biopsy from 07/29/16: It revealed metastatic adenocarcinoma of the colon. --She has started chemotherapy FOLFIRI and avastin, has been tolerating well, we'll continue every 2 weeks. -plan to repeat staging CT after today's cycle 5 chemo -Her CEA has significantly decreased since she started chemotherapy, likely responding to her good response to chemotherapy. -She tolerating chemotherapy well overall, if her restaging scan showed improvement, we'll continue chemotherapy. Patient is interested in liver targeted therapy if it's possible down the road.   2. Right breast cancer, pT1bN0M0, stage Ia, ER+/PR+/HER2-, History of left Breast Cancer, diagnosed on 02/01/2015 --Left CIS in situ, s/p lumpectomy in 2006 and 2011 by Dr. Margot Chimes, and radiation in 2011 by Dr. Sondra Come.  -I reviewed her surgical pathology results from 11/10/2015, which showed a 1 cm grade 2 invasive ductal carcinoma and DCIS. Surgical margins were negative. -I reviewed her Oncotype DX result. Her recurrence score is 22, which predicts 14% 10 year risk of distant recurrence  with tamoxifen alone. This is intermediate  risk, based on her relatively low number, I do not recommend adjuvant chemotherapy. -She has completed adjuvant breast radiation -She has not had menstrual period for 2 years until one months ago she had one episode of vaginal bleeding. -her Benjamin Perez level supports she is postmenopausal -She has started adjuvant letrozole, we'll continue. -We also discussed breast cancer surveillance, I strongly encouraged her to continue annual screening mammogram, self exam, and follow-up with [place] routinely.  3. History of PE (01/06/2014) --She has a diagnosis of pulmonary embolism, likely secondary to malignancy in the setting of family history for stroke (and possible stroke history in patient as well). Lower extremity dopplers negative.  -continue Xarelto. Due to her metastatic colon cancer, I recommend her to continue indefinitely.  4.  Uterine Fibroids  --Negative endometrial biopsy. Following with Gynecology.  5. Kidney lesion (Right). -No hypermetabolic right kidney lesion on the pet scan -Repeat abdominal MRI 05/29/2015 showed unchanged right renal lesion, favored to represent a complex/septated cyst.  6. Right flank pain - possible related to her liver metastasis -improved with chemo  -She'll continue Tylenol No. 4 as needed  7.  Morbid obesity - I encouraged her to eat healthy and exercise more. - I encouraged her to participate the " living well"  Program in our cancer center,  Which is designed for breast cancer survivor to improve your diet and exercise.  8. HTN  -The patient's blood pressure is high today. Could be related to the Avastin. -She does not have a monitor at home. I advised her to monitor at home. She will continue following up with her PCP  Plan  -Lab reviewed, adequate for treatment, she'll proceed 5th cycle FOLFIRI and Avastin today, continue every 2 weeks. -restaging CT CAP with contrast a few days before her next f/u in 2 weeks    All questions were answered. The patient knows to call the clinic with any problems, questions or concerns. We can certainly see the patient much sooner if necessary.  I spent 25 minutes counseling the patient face to face. The total time spent in the appointment was 30 minutes.  [name]  09/25/2016

## 2016-09-25 ENCOUNTER — Other Ambulatory Visit (HOSPITAL_BASED_OUTPATIENT_CLINIC_OR_DEPARTMENT_OTHER): Payer: BLUE CROSS/BLUE SHIELD

## 2016-09-25 ENCOUNTER — Encounter: Payer: Self-pay | Admitting: Hematology

## 2016-09-25 ENCOUNTER — Ambulatory Visit: Payer: BLUE CROSS/BLUE SHIELD

## 2016-09-25 ENCOUNTER — Ambulatory Visit (HOSPITAL_BASED_OUTPATIENT_CLINIC_OR_DEPARTMENT_OTHER): Payer: BLUE CROSS/BLUE SHIELD | Admitting: Hematology

## 2016-09-25 ENCOUNTER — Ambulatory Visit (HOSPITAL_BASED_OUTPATIENT_CLINIC_OR_DEPARTMENT_OTHER): Payer: BLUE CROSS/BLUE SHIELD

## 2016-09-25 VITALS — BP 146/83 | HR 62 | Temp 98.4°F | Resp 16 | Ht 66.0 in | Wt 274.7 lb

## 2016-09-25 DIAGNOSIS — Z5111 Encounter for antineoplastic chemotherapy: Secondary | ICD-10-CM

## 2016-09-25 DIAGNOSIS — C787 Secondary malignant neoplasm of liver and intrahepatic bile duct: Secondary | ICD-10-CM | POA: Diagnosis not present

## 2016-09-25 DIAGNOSIS — C187 Malignant neoplasm of sigmoid colon: Secondary | ICD-10-CM

## 2016-09-25 DIAGNOSIS — Z95828 Presence of other vascular implants and grafts: Secondary | ICD-10-CM

## 2016-09-25 DIAGNOSIS — Z5112 Encounter for antineoplastic immunotherapy: Secondary | ICD-10-CM

## 2016-09-25 DIAGNOSIS — Z86718 Personal history of other venous thrombosis and embolism: Secondary | ICD-10-CM

## 2016-09-25 DIAGNOSIS — C50511 Malignant neoplasm of lower-outer quadrant of right female breast: Secondary | ICD-10-CM

## 2016-09-25 DIAGNOSIS — Z170 Estrogen receptor positive status [ER+]: Secondary | ICD-10-CM

## 2016-09-25 DIAGNOSIS — I10 Essential (primary) hypertension: Secondary | ICD-10-CM

## 2016-09-25 LAB — COMPREHENSIVE METABOLIC PANEL
ALT: 22 U/L (ref 0–55)
ANION GAP: 9 meq/L (ref 3–11)
AST: 28 U/L (ref 5–34)
Albumin: 3 g/dL — ABNORMAL LOW (ref 3.5–5.0)
Alkaline Phosphatase: 51 U/L (ref 40–150)
BUN: 10.5 mg/dL (ref 7.0–26.0)
CO2: 27 mEq/L (ref 22–29)
CREATININE: 0.8 mg/dL (ref 0.6–1.1)
Calcium: 10 mg/dL (ref 8.4–10.4)
Chloride: 103 mEq/L (ref 98–109)
GLUCOSE: 106 mg/dL (ref 70–140)
Potassium: 3.5 mEq/L (ref 3.5–5.1)
SODIUM: 139 meq/L (ref 136–145)
Total Bilirubin: 0.55 mg/dL (ref 0.20–1.20)
Total Protein: 7.5 g/dL (ref 6.4–8.3)

## 2016-09-25 LAB — CBC WITH DIFFERENTIAL/PLATELET
BASO%: 0.8 % (ref 0.0–2.0)
Basophils Absolute: 0 10*3/uL (ref 0.0–0.1)
EOS%: 1.3 % (ref 0.0–7.0)
Eosinophils Absolute: 0.1 10*3/uL (ref 0.0–0.5)
HCT: 37.8 % (ref 34.8–46.6)
HGB: 12.7 g/dL (ref 11.6–15.9)
LYMPH%: 34.6 % (ref 14.0–49.7)
MCH: 32.3 pg (ref 25.1–34.0)
MCHC: 33.7 g/dL (ref 31.5–36.0)
MCV: 95.8 fL (ref 79.5–101.0)
MONO#: 0.5 10*3/uL (ref 0.1–0.9)
MONO%: 9.2 % (ref 0.0–14.0)
NEUT#: 3 10*3/uL (ref 1.5–6.5)
NEUT%: 54.1 % (ref 38.4–76.8)
PLATELETS: 264 10*3/uL (ref 145–400)
RBC: 3.95 10*6/uL (ref 3.70–5.45)
RDW: 19.5 % — ABNORMAL HIGH (ref 11.2–14.5)
WBC: 5.6 10*3/uL (ref 3.9–10.3)
lymph#: 1.9 10*3/uL (ref 0.9–3.3)

## 2016-09-25 LAB — CEA (IN HOUSE-CHCC): CEA (CHCC-In House): 43.09 ng/mL — ABNORMAL HIGH (ref 0.00–5.00)

## 2016-09-25 MED ORDER — DEXAMETHASONE SODIUM PHOSPHATE 10 MG/ML IJ SOLN
10.0000 mg | Freq: Once | INTRAMUSCULAR | Status: AC
  Administered 2016-09-25: 10 mg via INTRAVENOUS

## 2016-09-25 MED ORDER — FLUOROURACIL CHEMO INJECTION 2.5 GM/50ML
400.0000 mg/m2 | Freq: Once | INTRAVENOUS | Status: AC
  Administered 2016-09-25: 1000 mg via INTRAVENOUS
  Filled 2016-09-25: qty 20

## 2016-09-25 MED ORDER — DEXAMETHASONE SODIUM PHOSPHATE 10 MG/ML IJ SOLN
INTRAMUSCULAR | Status: AC
  Filled 2016-09-25: qty 1

## 2016-09-25 MED ORDER — SODIUM CHLORIDE 0.9% FLUSH
10.0000 mL | INTRAVENOUS | Status: DC | PRN
  Administered 2016-09-25: 10 mL via INTRAVENOUS
  Filled 2016-09-25: qty 10

## 2016-09-25 MED ORDER — BEVACIZUMAB CHEMO INJECTION 400 MG/16ML
5.0000 mg/kg | Freq: Once | INTRAVENOUS | Status: AC
  Administered 2016-09-25: 650 mg via INTRAVENOUS
  Filled 2016-09-25: qty 16

## 2016-09-25 MED ORDER — ATROPINE SULFATE 1 MG/ML IJ SOLN
0.5000 mg | Freq: Once | INTRAMUSCULAR | Status: AC | PRN
  Administered 2016-09-25: 0.5 mg via INTRAVENOUS

## 2016-09-25 MED ORDER — PALONOSETRON HCL INJECTION 0.25 MG/5ML
0.2500 mg | Freq: Once | INTRAVENOUS | Status: AC
  Administered 2016-09-25: 0.25 mg via INTRAVENOUS

## 2016-09-25 MED ORDER — PALONOSETRON HCL INJECTION 0.25 MG/5ML
INTRAVENOUS | Status: AC
Start: 2016-09-25 — End: 2016-09-25
  Filled 2016-09-25: qty 5

## 2016-09-25 MED ORDER — SODIUM CHLORIDE 0.9 % IV SOLN
Freq: Once | INTRAVENOUS | Status: AC
  Administered 2016-09-25: 12:00:00 via INTRAVENOUS

## 2016-09-25 MED ORDER — LEUCOVORIN CALCIUM INJECTION 350 MG
400.0000 mg/m2 | Freq: Once | INTRAMUSCULAR | Status: AC
  Administered 2016-09-25: 976 mg via INTRAVENOUS
  Filled 2016-09-25: qty 48.8

## 2016-09-25 MED ORDER — IRINOTECAN HCL CHEMO INJECTION 100 MG/5ML
180.0000 mg/m2 | Freq: Once | INTRAVENOUS | Status: AC
  Administered 2016-09-25: 440 mg via INTRAVENOUS
  Filled 2016-09-25: qty 15

## 2016-09-25 MED ORDER — ATROPINE SULFATE 1 MG/ML IJ SOLN
INTRAMUSCULAR | Status: AC
  Filled 2016-09-25: qty 1

## 2016-09-25 MED ORDER — FLUOROURACIL CHEMO INJECTION 5 GM/100ML
2400.0000 mg/m2 | INTRAVENOUS | Status: DC
  Administered 2016-09-25: 5850 mg via INTRAVENOUS
  Filled 2016-09-25: qty 117

## 2016-09-25 NOTE — Patient Instructions (Signed)
[facility] Discharge Instructions for Patients Receiving Chemotherapy  Today you received the following chemotherapy agents:  Irinotecan, Leucovorin, Avastin and 5FU  To help prevent nausea and vomiting after your treatment, we encourage you to take your nausea medication as ordered per MD.   If you develop nausea and vomiting that is not controlled by your nausea medication, call the clinic.   BELOW ARE SYMPTOMS THAT SHOULD BE REPORTED IMMEDIATELY:  *FEVER GREATER THAN 100.5 F  *CHILLS WITH OR WITHOUT FEVER  NAUSEA AND VOMITING THAT IS NOT CONTROLLED WITH YOUR NAUSEA MEDICATION  *UNUSUAL SHORTNESS OF BREATH  *UNUSUAL BRUISING OR BLEEDING  TENDERNESS IN MOUTH AND THROAT WITH OR WITHOUT PRESENCE OF ULCERS  *URINARY PROBLEMS  *BOWEL PROBLEMS  UNUSUAL RASH Items with * indicate a potential emergency and should be followed up as soon as possible.  Feel free to call the clinic you have any questions or concerns. The clinic phone number is [phone].  Please show the [place] at check-in to the Emergency Department and triage nurse.

## 2016-09-25 NOTE — Patient Instructions (Signed)

## 2016-09-27 ENCOUNTER — Ambulatory Visit (HOSPITAL_BASED_OUTPATIENT_CLINIC_OR_DEPARTMENT_OTHER): Payer: BLUE CROSS/BLUE SHIELD

## 2016-09-27 VITALS — BP 138/89 | HR 64 | Temp 98.4°F | Resp 18

## 2016-09-27 DIAGNOSIS — C187 Malignant neoplasm of sigmoid colon: Secondary | ICD-10-CM | POA: Diagnosis not present

## 2016-09-27 DIAGNOSIS — Z452 Encounter for adjustment and management of vascular access device: Secondary | ICD-10-CM

## 2016-09-27 MED ORDER — HEPARIN SOD (PORK) LOCK FLUSH 100 UNIT/ML IV SOLN
500.0000 [IU] | Freq: Once | INTRAVENOUS | Status: AC | PRN
  Administered 2016-09-27: 500 [IU]
  Filled 2016-09-27: qty 5

## 2016-09-27 MED ORDER — SODIUM CHLORIDE 0.9% FLUSH
10.0000 mL | INTRAVENOUS | Status: DC | PRN
  Administered 2016-09-27: 10 mL
  Filled 2016-09-27: qty 10

## 2016-09-28 ENCOUNTER — Telehealth: Payer: Self-pay | Admitting: Hematology

## 2016-09-28 NOTE — Telephone Encounter (Signed)
S/W PT, GAVE APPT FOR 1/25 @ 10 AM AND ADVISED CENTRAL SCHED WILL CALL TO ARRANGE SCANS.

## 2016-10-07 NOTE — Progress Notes (Signed)
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, estrogen receptor positive ([place])  Cancer of sigmoid colon ([place]) - Plan: prochlorperazine (COMPAZINE) 10 MG tablet, DISCONTINUED: 0.9 %  sodium chloride infusion, DISCONTINUED: atropine injection 0.5 mg, DISCONTINUED: dexamethasone (DECADRON) injection 10 mg, DISCONTINUED: palonosetron (ALOXI) injection 0.25 mg, DISCONTINUED: bevacizumab (AVASTIN) 650 mg in sodium chloride 0.9 % 100 mL chemo infusion, DISCONTINUED: irinotecan (CAMPTOSAR) 440 mg in dextrose 5 % 500 mL chemo infusion, DISCONTINUED: leucovorin 976 mg in dextrose 5 % 250 mL infusion, DISCONTINUED: fluorouracil (ADRUCIL) chemo injection 1,000 mg, DISCONTINUED: fluorouracil (ADRUCIL) 5,850 mg in sodium chloride 0.9 % 133 mL chemo infusion, DISCONTINUED: sodium chloride flush (NS) 0.9 % injection 10 mL, DISCONTINUED: heparin lock flush 100 unit/mL  Essential hypertension, benign  Obesity, Class III, BMI 40-49.9 (morbid obesity) ([name])  Personal history of venous thrombosis and embolism  PROBLEM LIST 1. Left breast DCIS diagnosed in 2006 and 2010, status post lumpectomy. 2. pT2N0M0 stage I colon cancer, s/p partial colectomy on 03/09/2014  3. PE in 12/2013 when she was diagnosed with colon cancer. 4. 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 [facility])   Staging form: Breast, AJCC 7th Edition     Clinical stage from 02/01/2015: Stage IA (T1a, N0, M0) - Signed by [name], MD on 06/04/2015     Pathologic stage from 11/10/2015: Stage IA (T1b, N0, M0) - Signed by [name], MD on 12/08/2015 Cancer of sigmoid colon [facility])   Staging form: Colon and Rectum, AJCC 7th Edition     Clinical: Stage I (T2, N0, M0) -  Unsigned     Breast cancer of lower-outer quadrant of right female breast ([place])   2006 Cancer Diagnosis    Left breast DCIS diagnosed in 2006 and 2010, status post lumpectomy.      01/07/2014 Initial Diagnosis    Breast cancer      01/18/2015 Mammogram    65m mass in lower outer quadrant of right breast       02/01/2015 Initial Biopsy    (R) breast mass biopsy showed invasive ductal carcinoma & DCIS, grade 1-2.       02/01/2015 Receptors her2    ER 90%+, PR 10%+, Ki67 10%, HER2 negative (ratio 1.09)       11/07/2015 Surgery    (R) breast lumpectomy and sentinel lymph node biopsy ([name]      11/07/2015 Pathology Results    (R) breast lumpectomy showed invasive ductal carcinoma, grade 2, 1 cm, low-grade DCIS, negative margins, 3 sentinel lymph nodes were negative. LVI (-). HER2 repeated and remains negative (ratio 1.29).        11/07/2015 Pathologic Stage    pT1b,pN0: Stage IA       11/07/2015 Oncotype testing    RS 22, which predicts a year risk of distant recurrence with tamoxifen alone 14% (intermediate-risk). Adjuvant chemo was not offered      02/21/2016 - 04/10/2016 Radiation Therapy    Adjuvant breast radiation. ([name]). Right breast: 50.4 Gy in 28 fractions.  Right breast boost: 12 Gy in 6 fractions.      05/2016 -  Anti-estrogen oral therapy    Femara 2.5 mg daily.        Cancer of sigmoid colon (  [place])   03/04/2014 Initial Diagnosis    Cancer of sigmoid colon      03/09/2014 Pathology Results    G1 invasive adenocarcinoma, no lymph-Vascular invasion or Peri-neural invasion: Absent. MMR normal, MSI stable.       03/09/2014 Surgery    partial colectomy, negative margins.       11/10/2014 Relapse/Recurrence    biopsy confirmed oligo liver recurrence       11/10/2014 Imaging    PET showed two small ares of hypermetabolism in the right lobe of the liver, no other distant metastasis.       11/30/2014 Imaging    abdomen MRI showed a new enhancing lesion which  corresponds to an area of abnormal hyper metabolism on recent PET-CT. Stable enlarged portal caval lymph node.       01/02/2015 Pathology Results    Liver biopsy showed metastatic adenocarcinoma, IHC (+) for CK20, CDX-2, (-) for TTF-1 and ER      01/02/2015 Miscellaneous    liver biopsy KRAS mutation (+)       02/14/2015 Imaging    CT showed:  the small metastatic right hepatic lobe liver lesion is not identified for certain on this examination. No new lesions. Stable small cysts in segment 4 a.       03/05/2015 - 04/16/2015 Chemotherapy    CAPEOX (capecitabine 2500 mg twice daily Day 1-14, oxaliplatin 1 30 mg/m on day 1, every 21 days, S/P 3 cycles       08/24/2015 Procedure    CT-guided oligo liver metastasis microwave ablation by Dr. [name]      06/17/2016 Imaging    CT of the chest/abd/pelvis with contrast 1. Several new small pulmonary nodules worrisome for metastatic disease. 2. New large area of probable infiltrating recurrent tumor in the right hepatic lobe surrounding the prior ablation site. 3. Small supraclavicular lymph nodes.  Attention on future studies. 4. Stable surgical changes involving both breasts. No breast masses are identified. 5. Stable bilateral adrenal gland nodules. 6. Stable upper abdominal and right-sided retroperitoneal lymph nodes.      07/08/2016 PET scan    1. Large mass in the right lobe of the liver demonstrates diffuse hypermetabolism, compatible with recurrent metastatic disease in the liver 2. Multiple small pulmonary nodules are very similar to the recent Chest CT 06/17/16.  3. No other findings of metastatic disease elsewhere in the neck, chest, abdomen, or pelvis. 4. There is some low-level hypermetabolic activity adjacent to the surgical clips in the lateral aspect of the right breast at the site of prior lumpectomy.       07/29/2016 Pathology Results    Liver biopsy confirmed metastatic colon cancer       07/31/2016 -  Chemotherapy     FOLFIRI every 2 weeks, Avastin added from cycle 2         CURRENT TREATMENT:   1. Letrozole 2.5 mg once daily, started in September 2017 2. Chemotherapy FOLFIRI and Avastin (from cycle 2) every 2 weeks, started on 07/31/2016  INTERVAL HISTORY:  Veronica Reyes 61 y.o. female with a history of recurrent left breast cancer, colon cancer s/p laparoscopic-assisted partial colectomy (03/09/2014), PE (01/06/2014), and right breast cancer (01/2015),  presents to clinic for follow up.   She is doing well overall. She has been tolerating chemotherapy well, her main complaint is her metallic taste change, is slightly low appetite. She is eating well. No significant diarrhea, no fever or chills. Mild fatigue, she sleeps more than before,  but able to function well at home   INTERIM HISTORY: She returns for follow up and 6th cycle of treatment. She did well on her last cycle of chemo, but had to worst fatigue. She has recovered well this week. She says no one called her to schedule her CT. She still has mild chronic right flank pain, which overall has improved since she started the chemotherapy. Denies any other concerns.   MEDICAL HISTORY: Past Medical History:  Diagnosis Date  . Anxiety   . Breast CA ([place])    radiation and surgery-lt.  . Cancer ([place])    left breast cancer x2  . Colon cancer ([place])    Dr. Burr Medico seeing monthly  . Hypertension   . Liver lesion   . Pulmonary embolism ([place])    "blood clot in lungs" -dx. 4'15 with CT Chest. On Xarelto  . Radiation 01/22/10-03/12/10   left whole breast 4500 cGy, upper aspect boosted to 6120 cGy  . Radiation 02/21/16 - 04/10/16   right breast 50.4 Gy, right breast boost 12 Gy  . Shortness of breath    sob with exertion. dx. with Pulmonary emboli 4'15 ,tx. Xarelto,Lovenox used for [employer].    ALLERGIES:  is allergic to tramadol.  MEDICATIONS:  Allergies as of 10/09/2016      Reactions   Tramadol Other (See Comments)   Dizziness       Medication List       Accurate as of 10/09/16 11:59 PM. Always use your most recent med list.          acetaminophen-codeine 300-60 MG tablet Commonly known as:  TYLENOL #4 Take 1 tablet by mouth every 8 (eight) hours as needed. for pain   amLODipine 2.5 MG tablet Commonly known as:  NORVASC Take 2.5 mg by mouth daily.   ferrous sulfate 325 (65 FE) MG tablet Take 325 mg by mouth daily with breakfast. Pt takes  4 times per week.   hydrochlorothiazide 25 MG tablet Commonly known as:  HYDRODIURIL Take 1 tablet (25 mg total) by mouth every morning.   HYDROcodone-acetaminophen 5-325 MG tablet Commonly known as:  NORCO Take 1 tablet by mouth every 6 (six) hours as needed for moderate pain.   letrozole 2.5 MG tablet Commonly known as:  FEMARA Take 1 tablet (2.5 mg total) by mouth daily.   lidocaine-prilocaine cream Commonly known as:  EMLA Apply 1 application topically as needed.   metoprolol 50 MG tablet Commonly known as:  LOPRESSOR Take 1 tablet (50 mg total) by mouth 2 (two) times daily.   ondansetron 8 MG tablet Commonly known as:  ZOFRAN Take 1 tablet (8 mg total) by mouth 2 (two) times daily as needed for refractory nausea / vomiting. Start on day 3 after chemotherapy.   potassium chloride SA 20 MEQ tablet Commonly known as:  K-DUR,KLOR-CON Take 1 tablet (20 mEq total) by mouth daily.   prochlorperazine 10 MG tablet Commonly known as:  COMPAZINE Take 1 tablet (10 mg total) by mouth every 6 (six) hours as needed (NAUSEA).   XARELTO 20 MG Tabs tablet Generic drug:  rivaroxaban Take 1 tablet by mouth daily with supper   zolpidem 5 MG tablet Commonly known as:  AMBIEN Take 1 tablet (5 mg total) by mouth at bedtime as needed for sleep.       SURGICAL HISTORY:  Past Surgical History:  Procedure Laterality Date  . BREAST LUMPECTOMY WITH RADIOACTIVE SEED AND SENTINEL LYMPH NODE BIOPSY Right 11/07/2015   Procedure: BREAST  LUMPECTOMY WITH RADIOACTIVE SEED AND  SENTINEL LYMPH NODE BIOPSY;  Surgeon: Stark Klein, MD;  Location: [facility];  Service: General;  Laterality: Right;  . BREAST SURGERY     '11- Left breast lumpectomy  . CESAREAN SECTION    . CHOLECYSTECTOMY     Laparoscopic 20 yrs ago  . COLON SURGERY  03-09-14   partial colectomy for cancer  . IR GENERIC HISTORICAL  07/29/2016   IR FLUORO GUIDE PORT INSERTION RIGHT 07/29/2016 Sandi Mariscal, MD [facility]  . IR GENERIC HISTORICAL  07/29/2016   IR US GUIDE VASC ACCESS RIGHT 07/29/2016 Sandi Mariscal, MD [facility]  . IR GENERIC HISTORICAL  07/29/2016   IR US GUIDE BX ASP/DRAIN 07/29/2016 [facility]  . PARTIAL COLECTOMY N/A 03/09/2014   Procedure: LAPAROSCOPIC ASSISTED PARTIAL COLECTOMY, splenic flexure mobilization;  Surgeon: Leighton Ruff, MD;  Location: [facility];  Service: General;  Laterality: N/A;  . RADIOFREQUENCY ABLATION Right 08/24/2015   Procedure: MICROWAVE ABLATION RIGHT LIVER LOBE ;  Surgeon: Corrie Mckusick, DO;  Location: [facility];  Service: Anesthesiology;  Laterality: Right;    REVIEW OF SYSTEMS:   Constitutional: Denies fevers, chills or abnormal weight loss (+) fatigue Eyes: Denies blurriness of vision Ears, nose, mouth, throat, and face: Denies mucositis or sore throat Respiratory: Denies cough, dyspnea or wheezes Cardiovascular: Denies palpitation, chest discomfort or lower extremity swelling Gastrointestinal:  Denies heartburn or change in bowel habits. (+) Nausea Skin: Denies abnormal skin rashes Musculoskeletal: (+) intermittent right-sided flank pain. Lymphatics: Denies new lymphadenopathy or easy bruising Neurological:Denies numbness, tingling or new weaknesses Musculoskeletal: (+) back pain Behavioral/Psych: Mood is stable, no new changes  All other systems were reviewed with the patient and are negative.    PHYSICAL EXAMINATION: ECOG PERFORMANCE STATUS: 1  Blood pressure (!) 149/87, pulse 67, temperature 99 F (37.2 C), temperature source  Oral, resp. rate 18, height '5\' 6"'  (1.676 m), weight 276 lb 1.6 oz (125.2 kg), last menstrual period 01/23/2014, SpO2 100 %.   GENERAL:alert, no distress and comfortable; well developed and obese. Easily mobile to exam table.  SKIN: skin color, texture, turgor are normal, no rashes or significant lesions EYES: normal, Conjunctiva are pink and non-injected, sclera clear OROPHARYNX:no exudate, no erythema and lips, buccal mucosa, and tongue normal  NECK: supple, thyroid normal size, non-tender, without nodularity LYMPH:  no palpable lymphadenopathy in the cervical, axillary or supraclavicular LUNGS: clear to auscultation with normal breathing effort, no wheezes or rhonchi HEART: regular rate & rhythm and no murmurs and no lower extremity edema ABDOMEN:abdomen soft, non-tender and normal bowel sounds; incision healing with signs of infection.  Mild tenderness at the  Right flank area, no abdominal tenderness. No local megaly Musculoskeletal:no cyanosis of digits and no clubbing, (+) tenderness at right flank area NEURO: alert & oriented x 3 with fluent speech, no focal motor/sensory deficits   Labs:  CBC Latest Ref Rng & Units 10/09/2016 09/25/2016 09/11/2016  WBC 3.9 - 10.3 10e3/uL 6.2 5.6 6.7  Hemoglobin 11.6 - 15.9 g/dL 12.8 12.7 12.8  Hematocrit 34.8 - 46.6 % 37.7 37.8 37.8  Platelets 145 - 400 10e3/uL 255 264 261    CMP Latest Ref Rng & Units 10/09/2016 09/25/2016 09/11/2016  Glucose 70 - 140 mg/dl 99 106 107  BUN 7.0 - 26.0 mg/dL 10.7 10.5 10.9  Creatinine 0.6 - 1.1 mg/dL 0.8 0.8 0.8  Sodium 136 - 145 mEq/L 139 139 139  Potassium 3.5 - 5.1 mEq/L 3.8 3.5 3.6  Chloride 101 -  111 mmol/L - - -  CO2 22 - 29 mEq/L '27 27 25  ' Calcium 8.4 - 10.4 mg/dL 9.7 10.0 9.4  Total Protein 6.4 - 8.3 g/dL 7.7 7.5 7.7  Total Bilirubin 0.20 - 1.20 mg/dL 0.52 0.55 0.56  Alkaline Phos 40 - 150 U/L 50 51 59  AST 5 - 34 U/L 41(H) 28 30  ALT 0 - 55 U/L 33 22 24   CEA  01/18/2014: 0.8 03/01/2015:  1.3 01/31/2016: 4.8 05/28/2016: 48 07/31/2016: 173.7 08/28/16: 139 09/25/2016: 43.09  PATHOLOGY RESULT Diagnosis 01/02/2015 Liver, needle/core biopsy, right - POSITIVE FOR METASTATIC ADENOCARCINOMA. - SEE COMMENT. Microscopic Comment Immunohistochemical stains are performed. The tumor is positive for cytokeratin 20 and CDX-2. Cytokeratin 7 stains background bile ducts but is negative within the tumor. The tumor is negative for TTF-1 and estrogen receptor. The morphology coupled with the staining pattern is consistent with metastatic adenocarcinoma of colorectal primary source.   Diagnosis 11/07/2015 1. Breast, lumpectomy, Right - INVASIVE DUCTAL CARCINOMA, GRADE 2, SPANNING 1 CM. - DUCTAL CARCINOMA IN SITU, LOW GRADE. - RESECTION MARGINS ARE NEGATIVE FOR INVASIVE CARCINOMA. - DUCTAL CARCINOMA IN SITU COMES TO WITHIN 0.3 CM OF THE POSTERIOR MARGIN. - BIOPSY SITE. - SEE ONCOLOGY TABLE. 2. Lymph node, sentinel, biopsy, right axillary #1 - ONE OF ONE LYMPH NODES NEGATIVE FOR CARCINOMA (0/1). 3. Lymph node, sentinel, biopsy, right axillary #2 - ONE OF ONE LYMPH NODES NEGATIVE FOR CARCINOMA (0/1). 4. Lymph node, sentinel, biopsy, right axillary #3 - ONE OF ONE LYMPH NODES NEGATIVE FOR CARCINOMA (0/1). Microscopic Comment 1. BREAST, INVASIVE TUMOR, WITH LYMPH NODES PRESENT Specimen, including laterality and lymph node sampling (sentinel, non-sentinel): Right breast lump and right axillary sentinel lymph nodes. Procedure: Right breast lumpectomy and right axillary sentinel lymph node biopsy. Histologic type: Invasive ductal carcinoma. Grade: 2. Tubule formation: 3 Nuclear pleomorphism: 2 Mitotic:1 Tumor size (gross measurement): 1 cm Margins: Invasive, distance to closest margin: 0.2 cm (posterior). In-situ, distance to closest margin: 0.3 cm (posterior, focal). If margin positive, focally or broadly: N/A. Lymphovascular invasion: Not identified. Ductal carcinoma in situ:  Present. Grade: I Extensive intraductal component: No. Lobular neoplasia: Not identified. Tumor focality: Unifocal. Treatment effect: N/A. Extent of tumor: Confined to breast parenchyma. Lymph nodes: Examined: 3 Sentinel 0 Non-sentinel 3 Total Lymph nodes with metastasis: 0 Isolated tumor cells (< 0.2 mm): 0 Micrometastasis: (> 0.2 mm and < 2.0 mm): 0 Macrometastasis: (> 2.0 mm): 0 Extracapsular extension: N/A. Breast prognostic profile: Performed on biopsy ([id number]), see below. Her2 will be repeated on the current specimen. Estrogen receptor: Positive, 90%, strong staining intensity. Progesterone receptor: Positive, 10%, strong staining intensity. Her 2 neu: Negative. Ki-67: 10%. Non-neoplastic breast: Fibrocystic change and usual ductal hyperplasia. Biopsy site. TNM: pT1b, pN0 Comments: None  ONCOTYPE DX: RS 22, which predicts a year risk of distant recurrence with tamoxifen alone 14%  Results: IMMUNOHISTOCHEMICAL AND MORPHOMETRIC ANALYSIS BY THE AUTOMATED CELLULAR IMAGING SYSTEM (ACIS) Estrogen Receptor: 90%, POSITIVE, STRONG STAINING INTENSITY (PERFORMED MANUALLY) Progesterone Receptor: 10%, POSITIVE, STRONG STAINING INTENSITY (PERFORMED MANUALLY) Proliferation Marker Ki67: 10% 1. FLUORESCENCE IN-SITU HYBRIDIZATION Results: HER2 - NEGATIVE RATIO OF HER2/CEP17 SIGNALS 1.29 AVERAGE HER2 COPY NUMBER PER CELL 1.80   Diagnosis 07/29/2016 Liver, needle/core biopsy, posterior of right lobe METASTATIC ADENOCARCINOMA, CONSISTENT WITH COLONIC PRIMARY. Microscopic Comment The neoplasm stains positive for ck20, cdx 2, negative for ck7. The morphology and immunostaining pattern support the above diagnosis. RADIOLOGY STUDIES   Abdomen MRI w wo contrast 05/29/2015  IMPRESSION: 1. Mild degradation secondary  to patient body habitus. 2. Isolated segment 6 subcapsular liver lesion is similar. No new or progressive disease identified. 3. Interpolar right renal lesion is favored  to represent a complex/septated cyst and is not significantly changed in size.  CT guided tissue ablation by Dr. Earleen Newport 08/24/2015 IMPRESSION: Status post image guided microwave ablation of solitary metastasis in the right liver lobe in this patient with oligo metastatic colorectal cancer.   CT CAP with contrast 06/17/2016 IMPRESSION: 1. Several new small pulmonary nodules worrisome for metastatic disease. 2. New large area of probable infiltrating recurrent tumor in the right hepatic lobe surrounding the prior ablation site. 3. Small supraclavicular lymph nodes.  Attention on future studies. 4. Stable surgical changes involving both breasts. No breast masses are identified. 5. Stable bilateral adrenal gland nodules. 6. Stable upper abdominal and right-sided retroperitoneal lymph nodes.Marland Kitchen  PET 07/08/2016 IMPRESSION: 1. Large mass in the right lobe of the liver demonstrates diffuse hypermetabolism, compatible with recurrent metastatic disease in the liver. 2. Multiple small pulmonary nodules are very similar to the recent chest CT 06/17/2016. These do not demonstrate hypermetabolism on the PET images, however, these lesions are well below the level of PET imaging. Given that these nodules are new compared to prior chest CT 12/18/2015, they are concerning for potential metastatic disease to the lungs, and continued attention on followup studies is recommended. 3. No other findings of metastatic disease elsewhere in the neck, chest, abdomen or pelvis. 4. There is some low-level hypermetabolic activity adjacent to the surgical clips in the lateral aspect of the right breast at the site of prior lumpectomy. This area has undergone interval involution over the several prior examinations, and this low-level activity is favored to be related to normal healing. Attention on follow-up imaging is recommended to ensure the stability or resolution of this finding. 5. Aortic  atherosclerosis, in addition to 2 vessel coronary artery disease. Please note that although the presence of coronary artery calcium documents the presence of coronary artery disease, the severity of this disease and any potential stenosis cannot be assessed on this non-gated CT examination. Assessment for potential risk factor modification, dietary therapy or pharmacologic therapy may be warranted, if clinically indicated. 6. Additional incidental findings, as above.   ASSESSMENT: Veronica Reyes 61 y.o. female   1. Colon Cancer, Stage I (pT2N0), MMR normal, oligo recurrent disease in liver in 12/2014, KRAS mutation (+), liver and lung recurrence in 06/2016 -I previously reviewed the nature history of metastatic colon cancer, and the potential curative approach with chemotherapy followed by liver lesion ablation or resection. However, more likely, this is not curable disease. -She initially received 3 cycles of [place], does not want to proceed with fourth cycle due to the moderate side effects from previous chemotherapy treatment. She declined liver mass resection, and underwent liver ablation by interventional radiologist Dr. Earleen Newport in 08/2015. - I previously reviewed her restaging CT scan from 06/17/2016, which unfortunately showed new area of infiltrative disease as the previous ablated liver lesion, likely recurrence. She also developed several new pulmonary nodules, small, concerning for metastatic disease. 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 --She has started chemotherapy FOLFIRI and avastin, has been tolerating well, we'll continue every 2 weeks. -Her CEA has significantly decreased since she started chemotherapy, likely responding to her good response to chemotherapy. -She tolerating chemotherapy well overall, if her restaging scan showed improvement, we'll continue chemotherapy. Patient is interested in liver  targeted therapy if it's  possible down the road. -She was suppose to get a CT before this cycle of treatment, but it hasn't been scheduled. Will schedule it for her before next cycle chemo  -Labs reviewed, adequate for treatment, continue with chemo treatment today.   2. Right breast cancer, pT1bN0M0, stage Ia, ER+/PR+/HER2-, History of left Breast Cancer, diagnosed on 02/01/2015 --Left CIS in situ, s/p lumpectomy in 2006 and 2011 by Dr. Margot Chimes, and radiation in 2011 by Dr. Sondra Come.  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 Surgical margins were negative. -I previously reviewed her Oncotype DX result. Her recurrence score is 22, which predicts 14% 10 year risk of distant recurrence with tamoxifen alone. This is intermediate risk, based on her relatively low number, I do not recommend adjuvant chemotherapy. -She has completed adjuvant breast radiation -her [place] level supports she is postmenopausal -She has started adjuvant letrozole, we'll continue. -We previously discussed breast cancer surveillance, I strongly encouraged her to continue annual screening mammogram, self exam, and follow-up with [place] routinely.  3. History of PE (01/06/2014) --She has a diagnosis of pulmonary embolism, likely secondary to malignancy in the setting of family history for stroke (and possible stroke history in patient as well). Lower extremity dopplers negative.  -continue Xarelto. Due to her metastatic colon cancer, I recommend her to continue indefinitely.  4.  Uterine Fibroids  --Negative endometrial biopsy. Following with Gynecology.  5. Kidney lesion (Right). -No hypermetabolic right kidney lesion on the pet scan -Repeat abdominal MRI 05/29/2015 showed unchanged right renal lesion, favored to represent a complex/septated cyst.  6. Right flank pain - possible related to her liver metastasis -improved with chemo  -She'll  continue Tylenol No. 4 as needed  7.  Morbid obesity - I encouraged her to eat healthy and exercise more. - I encouraged her to participate the " living well"  Program in our cancer center,  Which is designed for breast cancer survivor to improve your diet and exercise.  8. HTN  -The patient's blood pressure is high today. Could be related to the Avastin. -She does not have a monitor at home. I advised her to monitor at home. She will continue following up with her PCP  Plan  -Lab reviewed, adequate for treatment, she'll proceed 6th cycle FOLFIRI and Avastin today, continue every 2 weeks. -Reorder CT scan. -Refill compazine.  -Return next Friday.   All questions were answered. The patient knows to call the clinic with any problems, questions or concerns. We can certainly see the patient much sooner if necessary.  I spent 25 minutes counseling the patient face to face. The total time spent in the appointment was 30 minutes.  This document serves as a record of services personally performed by [name], MD. It was created on her behalf by Martinique Casey, a trained medical scribe. The creation of this record is based on the scribe's personal observations and the provider's statements to them. This document has been checked and approved by the attending provider.  I have reviewed the above documentation for accuracy and completeness, and I agree with the above information.      [name]  10/09/2016

## 2016-10-09 ENCOUNTER — Ambulatory Visit (HOSPITAL_BASED_OUTPATIENT_CLINIC_OR_DEPARTMENT_OTHER): Payer: BLUE CROSS/BLUE SHIELD | Admitting: Hematology

## 2016-10-09 ENCOUNTER — Other Ambulatory Visit (HOSPITAL_BASED_OUTPATIENT_CLINIC_OR_DEPARTMENT_OTHER): Payer: BLUE CROSS/BLUE SHIELD

## 2016-10-09 ENCOUNTER — Ambulatory Visit: Payer: BLUE CROSS/BLUE SHIELD

## 2016-10-09 ENCOUNTER — Ambulatory Visit (HOSPITAL_BASED_OUTPATIENT_CLINIC_OR_DEPARTMENT_OTHER): Payer: BLUE CROSS/BLUE SHIELD

## 2016-10-09 VITALS — BP 141/78

## 2016-10-09 VITALS — BP 149/87 | HR 67 | Temp 99.0°F | Resp 18 | Ht 66.0 in | Wt 276.1 lb

## 2016-10-09 DIAGNOSIS — Z5111 Encounter for antineoplastic chemotherapy: Secondary | ICD-10-CM

## 2016-10-09 DIAGNOSIS — Z95828 Presence of other vascular implants and grafts: Secondary | ICD-10-CM

## 2016-10-09 DIAGNOSIS — I10 Essential (primary) hypertension: Secondary | ICD-10-CM

## 2016-10-09 DIAGNOSIS — C787 Secondary malignant neoplasm of liver and intrahepatic bile duct: Secondary | ICD-10-CM

## 2016-10-09 DIAGNOSIS — E66813 Obesity, class 3: Secondary | ICD-10-CM

## 2016-10-09 DIAGNOSIS — C187 Malignant neoplasm of sigmoid colon: Secondary | ICD-10-CM

## 2016-10-09 DIAGNOSIS — Z5112 Encounter for antineoplastic immunotherapy: Secondary | ICD-10-CM | POA: Diagnosis not present

## 2016-10-09 DIAGNOSIS — Z86718 Personal history of other venous thrombosis and embolism: Secondary | ICD-10-CM

## 2016-10-09 DIAGNOSIS — Z79899 Other long term (current) drug therapy: Secondary | ICD-10-CM | POA: Diagnosis not present

## 2016-10-09 DIAGNOSIS — C50511 Malignant neoplasm of lower-outer quadrant of right female breast: Secondary | ICD-10-CM

## 2016-10-09 DIAGNOSIS — Z170 Estrogen receptor positive status [ER+]: Secondary | ICD-10-CM

## 2016-10-09 LAB — UA PROTEIN, DIPSTICK - CHCC: Protein, ur: NEGATIVE mg/dL

## 2016-10-09 LAB — CBC WITH DIFFERENTIAL/PLATELET
BASO%: 0.7 % (ref 0.0–2.0)
BASOS ABS: 0 10*3/uL (ref 0.0–0.1)
EOS%: 1.8 % (ref 0.0–7.0)
Eosinophils Absolute: 0.1 10*3/uL (ref 0.0–0.5)
HCT: 37.7 % (ref 34.8–46.6)
HEMOGLOBIN: 12.8 g/dL (ref 11.6–15.9)
LYMPH%: 44 % (ref 14.0–49.7)
MCH: 33.1 pg (ref 25.1–34.0)
MCHC: 34 g/dL (ref 31.5–36.0)
MCV: 97.2 fL (ref 79.5–101.0)
MONO#: 0.6 10*3/uL (ref 0.1–0.9)
MONO%: 9.6 % (ref 0.0–14.0)
NEUT#: 2.7 10*3/uL (ref 1.5–6.5)
NEUT%: 43.9 % (ref 38.4–76.8)
Platelets: 255 10*3/uL (ref 145–400)
RBC: 3.88 10*6/uL (ref 3.70–5.45)
RDW: 20.3 % — AB (ref 11.2–14.5)
WBC: 6.2 10*3/uL (ref 3.9–10.3)
lymph#: 2.7 10*3/uL (ref 0.9–3.3)

## 2016-10-09 LAB — COMPREHENSIVE METABOLIC PANEL
ALBUMIN: 3.2 g/dL — AB (ref 3.5–5.0)
ALT: 33 U/L (ref 0–55)
AST: 41 U/L — ABNORMAL HIGH (ref 5–34)
Alkaline Phosphatase: 50 U/L (ref 40–150)
Anion Gap: 10 mEq/L (ref 3–11)
BUN: 10.7 mg/dL (ref 7.0–26.0)
CHLORIDE: 102 meq/L (ref 98–109)
CO2: 27 mEq/L (ref 22–29)
Calcium: 9.7 mg/dL (ref 8.4–10.4)
Creatinine: 0.8 mg/dL (ref 0.6–1.1)
EGFR: >90 mL/min/{1.73_m2} (ref >90)
GLUCOSE: 99 mg/dL (ref 70–140)
POTASSIUM: 3.8 meq/L (ref 3.5–5.1)
SODIUM: 139 meq/L (ref 136–145)
Total Bilirubin: 0.52 mg/dL (ref 0.20–1.20)
Total Protein: 7.7 g/dL (ref 6.4–8.3)

## 2016-10-09 MED ORDER — PALONOSETRON HCL INJECTION 0.25 MG/5ML
0.2500 mg | Freq: Once | INTRAVENOUS | Status: AC
  Administered 2016-10-09: 0.25 mg via INTRAVENOUS

## 2016-10-09 MED ORDER — PROCHLORPERAZINE MALEATE 10 MG PO TABS: 10.0000 mg | ORAL_TABLET | Freq: Four times a day (QID) | ORAL | 1 refills | Status: DC | PRN

## 2016-10-09 MED ORDER — ATROPINE SULFATE 1 MG/ML IJ SOLN
INTRAMUSCULAR | Status: AC
  Filled 2016-10-09: qty 1

## 2016-10-09 MED ORDER — DEXAMETHASONE SODIUM PHOSPHATE 10 MG/ML IJ SOLN
10.0000 mg | Freq: Once | INTRAMUSCULAR | Status: AC
  Administered 2016-10-09: 10 mg via INTRAVENOUS

## 2016-10-09 MED ORDER — PALONOSETRON HCL INJECTION 0.25 MG/5ML
INTRAVENOUS | Status: AC
  Filled 2016-10-09: qty 5

## 2016-10-09 MED ORDER — DEXAMETHASONE SODIUM PHOSPHATE 10 MG/ML IJ SOLN
INTRAMUSCULAR | Status: AC
  Filled 2016-10-09: qty 1

## 2016-10-09 MED ORDER — FLUOROURACIL CHEMO INJECTION 2.5 GM/50ML
400.0000 mg/m2 | Freq: Once | INTRAVENOUS | Status: AC
  Administered 2016-10-09: 1000 mg via INTRAVENOUS
  Filled 2016-10-09: qty 20

## 2016-10-09 MED ORDER — SODIUM CHLORIDE 0.9 % IV SOLN
Freq: Once | INTRAVENOUS | Status: AC
  Administered 2016-10-09: 12:00:00 via INTRAVENOUS

## 2016-10-09 MED ORDER — LEUCOVORIN CALCIUM INJECTION 350 MG
400.0000 mg/m2 | Freq: Once | INTRAVENOUS | Status: AC
  Administered 2016-10-09: 976 mg via INTRAVENOUS
  Filled 2016-10-09: qty 48.8

## 2016-10-09 MED ORDER — SODIUM CHLORIDE 0.9 % IV SOLN
2400.0000 mg/m2 | INTRAVENOUS | Status: DC
  Administered 2016-10-09: 5850 mg via INTRAVENOUS
  Filled 2016-10-09: qty 117

## 2016-10-09 MED ORDER — ATROPINE SULFATE 1 MG/ML IJ SOLN
0.5000 mg | Freq: Once | INTRAMUSCULAR | Status: AC | PRN
Start: 2016-10-09 — End: 2016-10-09
  Administered 2016-10-09: 0.5 mg via INTRAVENOUS

## 2016-10-09 MED ORDER — IRINOTECAN HCL CHEMO INJECTION 100 MG/5ML
180.0000 mg/m2 | Freq: Once | INTRAVENOUS | Status: AC
  Administered 2016-10-09: 440 mg via INTRAVENOUS
  Filled 2016-10-09: qty 15

## 2016-10-09 MED ORDER — SODIUM CHLORIDE 0.9 % IV SOLN
5.0000 mg/kg | Freq: Once | INTRAVENOUS | Status: AC
  Administered 2016-10-09: 650 mg via INTRAVENOUS
  Filled 2016-10-09: qty 14

## 2016-10-09 MED ORDER — SODIUM CHLORIDE 0.9% FLUSH
10.0000 mL | INTRAVENOUS | Status: DC | PRN
  Administered 2016-10-09: 10 mL via INTRAVENOUS
  Filled 2016-10-09: qty 10

## 2016-10-09 NOTE — Patient Instructions (Signed)
[facility] Discharge Instructions for Patients Receiving Chemotherapy  Today you received the following chemotherapy agents Avastin, Irinotecan, Leucovorin and Adrucil   To help prevent nausea and vomiting after your treatment, we encourage you to take your nausea medication as directed. No Zofran for 3 days. Take Compazine instead.   If you develop nausea and vomiting that is not controlled by your nausea medication, call the clinic.   BELOW ARE SYMPTOMS THAT SHOULD BE REPORTED IMMEDIATELY:  *FEVER GREATER THAN 100.5 F  *CHILLS WITH OR WITHOUT FEVER  NAUSEA AND VOMITING THAT IS NOT CONTROLLED WITH YOUR NAUSEA MEDICATION  *UNUSUAL SHORTNESS OF BREATH  *UNUSUAL BRUISING OR BLEEDING  TENDERNESS IN MOUTH AND THROAT WITH OR WITHOUT PRESENCE OF ULCERS  *URINARY PROBLEMS  *BOWEL PROBLEMS  UNUSUAL RASH Items with * indicate a potential emergency and should be followed up as soon as possible.  Feel free to call the clinic you have any questions or concerns. The clinic phone number is [phone].  Please show the CHEMO ALERT CARD at check-in to the Emergency Department and triage nurse.   

## 2016-10-11 ENCOUNTER — Encounter: Payer: Self-pay | Admitting: Hematology

## 2016-10-11 ENCOUNTER — Ambulatory Visit (HOSPITAL_BASED_OUTPATIENT_CLINIC_OR_DEPARTMENT_OTHER): Payer: Self-pay

## 2016-10-11 VITALS — BP 154/92 | HR 70 | Temp 98.4°F | Resp 18

## 2016-10-11 DIAGNOSIS — C187 Malignant neoplasm of sigmoid colon: Secondary | ICD-10-CM

## 2016-10-11 MED ORDER — SODIUM CHLORIDE 0.9% FLUSH
10.0000 mL | INTRAVENOUS | Status: DC | PRN
  Administered 2016-10-11: 10 mL
  Filled 2016-10-11: qty 10

## 2016-10-11 MED ORDER — HEPARIN SOD (PORK) LOCK FLUSH 100 UNIT/ML IV SOLN
500.0000 [IU] | Freq: Once | INTRAVENOUS | Status: AC | PRN
  Administered 2016-10-11: 500 [IU]
  Filled 2016-10-11: qty 5

## 2016-10-11 NOTE — Patient Instructions (Signed)

## 2016-10-13 ENCOUNTER — Other Ambulatory Visit: Payer: BLUE CROSS/BLUE SHIELD

## 2016-10-17 ENCOUNTER — Ambulatory Visit
Admission: RE | Admit: 2016-10-17 | Discharge: 2016-10-17 | Disposition: A | Discharge status: Home / Self Care | Payer: BLUE CROSS/BLUE SHIELD | Source: Ambulatory Visit | Attending: Adult Health | Admitting: Adult Health

## 2016-10-17 DIAGNOSIS — Z170 Estrogen receptor positive status [ER+]: Principal | ICD-10-CM

## 2016-10-17 DIAGNOSIS — C50511 Malignant neoplasm of lower-outer quadrant of right female breast: Secondary | ICD-10-CM

## 2016-10-20 ENCOUNTER — Telehealth: Payer: Self-pay | Admitting: Hematology

## 2016-10-20 ENCOUNTER — Ambulatory Visit (HOSPITAL_COMMUNITY): Payer: BLUE CROSS/BLUE SHIELD

## 2016-10-20 ENCOUNTER — Telehealth: Payer: Self-pay | Admitting: *Deleted

## 2016-10-20 NOTE — Telephone Encounter (Signed)
I tried to call both number and neither has a voice mail

## 2016-10-20 NOTE — Telephone Encounter (Signed)
Spoke with pt and gave pt new instructions along with appt to see Dr. Burr Medico.  Friday 10/24/16 : 1.  Pt to be NPO for 6 hours prior to CT scans.   Drink 1 bottle of contrast at 1130 am ;  Second bottle of contrast      At  1230 pm.   Arrive to [facility] radiology at 115 pm ;  CT scans scheduled for 130 pm. 2.  Pt to see Dr. Burr Medico at 330 pm on Friday 10/24/16 after CT scans. Pt voiced understanding and able to verbalize back above instructions.

## 2016-10-21 ENCOUNTER — Ambulatory Visit: Payer: BLUE CROSS/BLUE SHIELD | Admitting: Hematology

## 2016-10-22 NOTE — Progress Notes (Signed)
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 Left breast DCIS diagnosed in 2006 and 2010, status post lumpectomy. 2. pT2N0M0 stage I colon cancer, s/p partial colectomy on 03/09/2014  3. PE in 12/2013 when she was diagnosed with colon cancer. 4. 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 [facility])   Staging form: Breast, AJCC 7th Edition     Clinical stage from 02/01/2015: Stage IA (T1a, N0, M0) - Signed by [name], MD on 06/04/2015     Pathologic stage from 11/10/2015: Stage IA (T1b, N0, M0) - Signed by [name], MD on 12/08/2015 Cancer of sigmoid colon [facility])   Staging form: Colon and Rectum, AJCC 7th Edition     Clinical: Stage I (T2, N0, M0) - Unsigned     Breast cancer of lower-outer quadrant of right female breast ([place])   2006 Cancer Diagnosis    Left breast DCIS diagnosed in 2006 and 2010, status post lumpectomy.      01/07/2014 Initial Diagnosis    Breast cancer      01/18/2015 Mammogram    8m mass in lower outer quadrant of right breast       02/01/2015 Initial Biopsy    (R) breast mass biopsy showed invasive ductal carcinoma & DCIS, grade 1-2.       02/01/2015 Receptors her2    ER 90%+, PR 10%+, Ki67 10%, HER2 negative (ratio 1.09)       11/07/2015 Surgery    (R) breast lumpectomy and sentinel lymph node biopsy ([name]      11/07/2015 Pathology Results    (R) breast lumpectomy showed invasive ductal carcinoma, grade 2, 1 cm, low-grade DCIS, negative  margins, 3 sentinel lymph nodes were negative. LVI (-). HER2 repeated and remains negative (ratio 1.29).        11/07/2015 Pathologic Stage    pT1b,pN0: Stage IA       11/07/2015 Oncotype testing    RS 22, which predicts a year risk of distant recurrence with tamoxifen alone 14% (intermediate-risk). Adjuvant chemo was not offered      02/21/2016 - 04/10/2016 Radiation Therapy    Adjuvant breast radiation. ([name]). Right breast: 50.4 Gy in 28 fractions.  Right breast boost: 12 Gy in 6 fractions.      05/2016 -  Anti-estrogen oral therapy    Femara 2.5 mg daily.       10/17/2016 Mammogram    MM DIAG BREAST TOMO BILATERAL 10/17/2016 FINDINGS: There are bilateral lumpectomy changes in the upper central left breast and upper central right breast. No mass, nonsurgical distortion, or suspicious microcalcification is identified in either breast to suggest malignancy. Mammographic images were processed with CAD. IMPRESSION: No evidence of malignancy in either breast. Lumpectomy changes bilaterally. RECOMMENDATION: Diagnostic mammogram is suggested in 1 year. (Code:DM-B-01Y)       [place]moid [place]   03/04/2014 Initial Diagnosis    Cancer of sigmoid colon      03/09/2014 Pathology Results    G1 invasive adenocarcinoma, no lymph-Vascular invasion or Peri-neural  invasion: Absent. MMR normal, MSI stable.       03/09/2014 Surgery    partial colectomy, negative margins.       11/10/2014 Relapse/Recurrence    biopsy confirmed oligo liver recurrence       11/10/2014 Imaging    PET showed two small ares of hypermetabolism in the right lobe of the liver, no other distant metastasis.       11/30/2014 Imaging    abdomen MRI showed a new enhancing lesion which corresponds to an area of abnormal hyper metabolism on recent PET-CT. Stable enlarged portal caval lymph node.       01/02/2015 Pathology Results    Liver biopsy showed metastatic adenocarcinoma, IHC (+) for CK20, CDX-2, (-) for  TTF-1 and ER      01/02/2015 Miscellaneous    liver biopsy KRAS mutation (+)       02/14/2015 Imaging    CT showed:  the small metastatic right hepatic lobe liver lesion is not identified for certain on this examination. No new lesions. Stable small cysts in segment 4 a.       03/05/2015 - 04/16/2015 Chemotherapy    CAPEOX (capecitabine 2500 mg twice daily Day 1-14, oxaliplatin 1 30 mg/m on day 1, every 21 days, S/P 3 cycles       08/24/2015 Procedure    CT-guided oligo liver metastasis microwave ablation by Dr. [name]      06/17/2016 Imaging    CT of the chest/abd/pelvis with contrast 1. Several new small pulmonary nodules worrisome for metastatic disease. 2. New large area of probable infiltrating recurrent tumor in the right hepatic lobe surrounding the prior ablation site. 3. Small supraclavicular lymph nodes.  Attention on future studies. 4. Stable surgical changes involving both breasts. No breast masses are identified. 5. Stable bilateral adrenal gland nodules. 6. Stable upper abdominal and right-sided retroperitoneal lymph nodes.      07/08/2016 PET scan    1. Large mass in the right lobe of the liver demonstrates diffuse hypermetabolism, compatible with recurrent metastatic disease in the liver 2. Multiple small pulmonary nodules are very similar to the recent Chest CT 06/17/16.  3. No other findings of metastatic disease elsewhere in the neck, chest, abdomen, or pelvis. 4. There is some low-level hypermetabolic activity adjacent to the surgical clips in the lateral aspect of the right breast at the site of prior lumpectomy.       07/29/2016 Pathology Results    Liver biopsy confirmed metastatic colon cancer       07/31/2016 -  Chemotherapy    FOLFIRI every 2 weeks, Avastin added from cycle 2       10/23/2016 Imaging    CT ABDOMEN PELVIS W CONTRAST 10/24/16 IMPRESSION: 1. The numerous tiny bilateral pulmonary nodules seen on the previous imaging exams are no longer  evident, suggesting response of therapy. 2. Large ill-defined irregular right hepatic lesion measures smaller on today's study. 3. Slight increase in size of hepatoduodenal ligament lymphadenopathy. 4. Abdominal Aortic Atherosclerois (ICD10-170.0)        CURRENT TREATMENT:   1. Letrozole 2.5 mg once daily, started in September 2017 2. Chemotherapy FOLFIRI and Avastin (from cycle 2) every 2 weeks, started on 07/31/2016  INTERIM HISTORY:  Veronica Reyes 61 y.o. female with a history of recurrent left breast cancer, colon cancer s/p laparoscopic-assisted partial colectomy (03/09/2014), PE (01/06/2014), and right breast cancer (01/2015),  presents to clinic for follow up.   She returns for follow up to discussed CT scan  report. She did not have chemo yesterday as scheduled. Mammogram on 10/17/16 was negative for malignancy. She still has mild chronic right flank pain that has improved since she started chemotherapy. She is taking Tylenol #4, 1 pill a day at night that helps her sleep. Requesting a refill. Reports nose bleeding when she wakes up, but states it is not much. Reports she will be out of town until November 18, 2016 for a funeral and wedding. Her nephew passed unexpectedly.  MEDICAL HISTORY: Past Medical History:  Diagnosis Date  . Anxiety   . Breast CA ([place])    radiation and surgery-lt.  . Cancer ([place])    left breast cancer x2  . Colon cancer ([place])    Dr. Burr Medico seeing monthly  . Hypertension   . Liver lesion   . Pulmonary embolism ([place])    "blood clot in lungs" -dx. 4'15 with CT Chest. On Xarelto  . Radiation 01/22/10-03/12/10   left whole breast 4500 cGy, upper aspect boosted to 6120 cGy  . Radiation 02/21/16 - 04/10/16   right breast 50.4 Gy, right breast boost 12 Gy  . Shortness of breath    sob with exertion. dx. with Pulmonary emboli 4'15 ,tx. Xarelto,Lovenox used for [employer].    ALLERGIES:  is allergic to tramadol.  MEDICATIONS:  Allergies as of 10/24/2016       Reactions   Tramadol Other (See Comments)   Dizziness      Medication List       Accurate as of 10/24/16 10:31 PM. Always use your most recent med list.          acetaminophen-codeine 300-60 MG tablet Commonly known as:  TYLENOL #4 Take 1 tablet by mouth every 8 (eight) hours as needed. for pain   amLODipine 2.5 MG tablet Commonly known as:  NORVASC Take 2.5 mg by mouth daily.   ferrous sulfate 325 (65 FE) MG tablet Take 325 mg by mouth daily with breakfast. Pt takes  4 times per week.   hydrochlorothiazide 25 MG tablet Commonly known as:  HYDRODIURIL Take 1 tablet (25 mg total) by mouth every morning.   HYDROcodone-acetaminophen 5-325 MG tablet Commonly known as:  NORCO Take 1 tablet by mouth every 6 (six) hours as needed for moderate pain.   letrozole 2.5 MG tablet Commonly known as:  FEMARA Take 1 tablet (2.5 mg total) by mouth daily.   lidocaine-prilocaine cream Commonly known as:  EMLA Apply 1 application topically as needed.   metoprolol 50 MG tablet Commonly known as:  LOPRESSOR Take 1 tablet (50 mg total) by mouth 2 (two) times daily.   ondansetron 8 MG tablet Commonly known as:  ZOFRAN Take 1 tablet (8 mg total) by mouth 2 (two) times daily as needed for refractory nausea / vomiting. Start on day 3 after chemotherapy.   potassium chloride SA 20 MEQ tablet Commonly known as:  K-DUR,KLOR-CON Take 1 tablet (20 mEq total) by mouth daily.   prochlorperazine 10 MG tablet Commonly known as:  COMPAZINE Take 1 tablet (10 mg total) by mouth every 6 (six) hours as needed (NAUSEA).   XARELTO 20 MG Tabs tablet Generic drug:  rivaroxaban Take 1 tablet by mouth daily with supper   zolpidem 5 MG tablet Commonly known as:  AMBIEN Take 1 tablet (5 mg total) by mouth at bedtime as needed for sleep.       SURGICAL HISTORY:  Past Surgical History:  Procedure Laterality Date  . BREAST LUMPECTOMY WITH RADIOACTIVE SEED AND SENTINEL  LYMPH NODE BIOPSY Right  11/07/2015   Procedure: BREAST LUMPECTOMY WITH RADIOACTIVE SEED AND SENTINEL LYMPH NODE BIOPSY;  Surgeon: Stark Klein, MD;  Location: [facility];  Service: General;  Laterality: Right;  . BREAST SURGERY     '11- Left breast lumpectomy  . CESAREAN SECTION    . CHOLECYSTECTOMY     Laparoscopic 20 yrs ago  . COLON SURGERY  03-09-14   partial colectomy for cancer  . IR GENERIC HISTORICAL  07/29/2016   IR FLUORO GUIDE PORT INSERTION RIGHT 07/29/2016 Sandi Mariscal, MD [facility]  . IR GENERIC HISTORICAL  07/29/2016   IR US GUIDE VASC ACCESS RIGHT 07/29/2016 Sandi Mariscal, MD [facility]  . IR GENERIC HISTORICAL  07/29/2016   IR US GUIDE BX ASP/DRAIN 07/29/2016 [facility]  . PARTIAL COLECTOMY N/A 03/09/2014   Procedure: LAPAROSCOPIC ASSISTED PARTIAL COLECTOMY, splenic flexure mobilization;  Surgeon: Leighton Ruff, MD;  Location: [facility];  Service: General;  Laterality: N/A;  . RADIOFREQUENCY ABLATION Right 08/24/2015   Procedure: MICROWAVE ABLATION RIGHT LIVER LOBE ;  Surgeon: Corrie Mckusick, DO;  Location: [facility];  Service: Anesthesiology;  Laterality: Right;    REVIEW OF SYSTEMS:   Constitutional: Denies fevers, chills or abnormal weight loss (+) fatigue Eyes: Denies blurriness of vision Ears, nose, mouth, throat, and face: Denies mucositis or sore throat Respiratory: Denies cough, dyspnea or wheezes Cardiovascular: Denies palpitation, chest discomfort or lower extremity swelling Gastrointestinal:  Denies heartburn or change in bowel habits. (+) Nausea Skin: Denies abnormal skin rashes Musculoskeletal: (+) intermittent right-sided flank pain. Lymphatics: Denies new lymphadenopathy or easy bruising Neurological:Denies numbness, tingling or new weaknesses Musculoskeletal: (+) back pain Behavioral/Psych: Mood is stable, no new changes  All other systems were reviewed with the patient and are negative.    PHYSICAL EXAMINATION: ECOG PERFORMANCE STATUS: 1  Blood pressure (!)  141/98, pulse 77, temperature 98.9 F (37.2 C), temperature source Oral, resp. rate 18, height '5\' 6"'  (1.676 m), weight 272 lb 9.6 oz (123.7 kg), last menstrual period 01/23/2014, SpO2 97 %.   GENERAL:alert, no distress and comfortable; well developed and obese. Easily mobile to exam table.  SKIN: skin color, texture, turgor are normal, no rashes or significant lesions EYES: normal, Conjunctiva are pink and non-injected, sclera clear OROPHARYNX:no exudate, no erythema and lips, buccal mucosa, and tongue normal  NECK: supple, thyroid normal size, non-tender, without nodularity LYMPH:  no palpable lymphadenopathy in the cervical, axillary or supraclavicular LUNGS: clear to auscultation with normal breathing effort, no wheezes or rhonchi HEART: regular rate & rhythm and no murmurs and no lower extremity edema ABDOMEN:abdomen soft, non-tender and normal bowel sounds; incision healing with signs of infection.  Mild tenderness at the  Right flank area, no abdominal tenderness. No local megaly Musculoskeletal:no cyanosis of digits and no clubbing, (+) tenderness at right flank area NEURO: alert & oriented x 3 with fluent speech, no focal motor/sensory deficits   Labs:  CBC Latest Ref Rng & Units 10/09/2016 09/25/2016 09/11/2016  WBC 3.9 - 10.3 10e3/uL 6.2 5.6 6.7  Hemoglobin 11.6 - 15.9 g/dL 12.8 12.7 12.8  Hematocrit 34.8 - 46.6 % 37.7 37.8 37.8  Platelets 145 - 400 10e3/uL 255 264 261    CMP Latest Ref Rng & Units 10/09/2016 09/25/2016 09/11/2016  Glucose 70 - 140 mg/dl 99 106 107  BUN 7.0 - 26.0 mg/dL 10.7 10.5 10.9  Creatinine 0.6 - 1.1 mg/dL 0.8 0.8 0.8  Sodium 136 - 145 mEq/L 139 139 139  Potassium 3.5 -  5.1 mEq/L 3.8 3.5 3.6  Chloride 101 - 111 mmol/L - - -  CO2 22 - 29 mEq/L '27 27 25  ' Calcium 8.4 - 10.4 mg/dL 9.7 10.0 9.4  Total Protein 6.4 - 8.3 g/dL 7.7 7.5 7.7  Total Bilirubin 0.20 - 1.20 mg/dL 0.52 0.55 0.56  Alkaline Phos 40 - 150 U/L 50 51 59  AST 5 - 34 U/L 41(H) 28 30  ALT 0  - 55 U/L 33 22 24   CEA  01/18/2014: 0.8 03/01/2015: 1.3 01/31/2016: 4.8 05/28/2016: 48 07/31/2016: 173.7 08/28/16: 139 09/25/2016: 43  PATHOLOGY RESULT Diagnosis 01/02/2015 Liver, needle/core biopsy, right - POSITIVE FOR METASTATIC ADENOCARCINOMA. - SEE COMMENT. Microscopic Comment Immunohistochemical stains are performed. The tumor is positive for cytokeratin 20 and CDX-2. Cytokeratin 7 stains background bile ducts but is negative within the tumor. The tumor is negative for TTF-1 and estrogen receptor. The morphology coupled with the staining pattern is consistent with metastatic adenocarcinoma of colorectal primary source.   Diagnosis 11/07/2015 1. Breast, lumpectomy, Right - INVASIVE DUCTAL CARCINOMA, GRADE 2, SPANNING 1 CM. - DUCTAL CARCINOMA IN SITU, LOW GRADE. - RESECTION MARGINS ARE NEGATIVE FOR INVASIVE CARCINOMA. - DUCTAL CARCINOMA IN SITU COMES TO WITHIN 0.3 CM OF THE POSTERIOR MARGIN. - BIOPSY SITE. - SEE ONCOLOGY TABLE. 2. Lymph node, sentinel, biopsy, right axillary #1 - ONE OF ONE LYMPH NODES NEGATIVE FOR CARCINOMA (0/1). 3. Lymph node, sentinel, biopsy, right axillary #2 - ONE OF ONE LYMPH NODES NEGATIVE FOR CARCINOMA (0/1). 4. Lymph node, sentinel, biopsy, right axillary #3 - ONE OF ONE LYMPH NODES NEGATIVE FOR CARCINOMA (0/1). Microscopic Comment 1. BREAST, INVASIVE TUMOR, WITH LYMPH NODES PRESENT Specimen, including laterality and lymph node sampling (sentinel, non-sentinel): Right breast lump and right axillary sentinel lymph nodes. Procedure: Right breast lumpectomy and right axillary sentinel lymph node biopsy. Histologic type: Invasive ductal carcinoma. Grade: 2. Tubule formation: 3 Nuclear pleomorphism: 2 Mitotic:1 Tumor size (gross measurement): 1 cm Margins: Invasive, distance to closest margin: 0.2 cm (posterior). In-situ, distance to closest margin: 0.3 cm (posterior, focal). If margin positive, focally or broadly: N/A. Lymphovascular invasion:  Not identified. Ductal carcinoma in situ: Present. Grade: I Extensive intraductal component: No. Lobular neoplasia: Not identified. Tumor focality: Unifocal. Treatment effect: N/A. Extent of tumor: Confined to breast parenchyma. Lymph nodes: Examined: 3 Sentinel 0 Non-sentinel 3 Total Lymph nodes with metastasis: 0 Isolated tumor cells (< 0.2 mm): 0 Micrometastasis: (> 0.2 mm and < 2.0 mm): 0 Macrometastasis: (> 2.0 mm): 0 Extracapsular extension: N/A. Breast prognostic profile: Performed on biopsy ([id number]), see below. Her2 will be repeated on the current specimen. Estrogen receptor: Positive, 90%, strong staining intensity. Progesterone receptor: Positive, 10%, strong staining intensity. Her 2 neu: Negative. Ki-67: 10%. Non-neoplastic breast: Fibrocystic change and usual ductal hyperplasia. Biopsy site. TNM: pT1b, pN0 Comments: None  ONCOTYPE DX: RS 22, which predicts a year risk of distant recurrence with tamoxifen alone 14%  Results: IMMUNOHISTOCHEMICAL AND MORPHOMETRIC ANALYSIS BY THE AUTOMATED CELLULAR IMAGING SYSTEM (ACIS) Estrogen Receptor: 90%, POSITIVE, STRONG STAINING INTENSITY (PERFORMED MANUALLY) Progesterone Receptor: 10%, POSITIVE, STRONG STAINING INTENSITY (PERFORMED MANUALLY) Proliferation Marker Ki67: 10% 1. FLUORESCENCE IN-SITU HYBRIDIZATION Results: HER2 - NEGATIVE RATIO OF HER2/CEP17 SIGNALS 1.29 AVERAGE HER2 COPY NUMBER PER CELL 1.80   Diagnosis 07/29/2016 Liver, needle/core biopsy, posterior of right lobe METASTATIC ADENOCARCINOMA, CONSISTENT WITH COLONIC PRIMARY. Microscopic Comment The neoplasm stains positive for ck20, cdx 2, negative for ck7. The morphology and immunostaining pattern support the above diagnosis.  RADIOLOGY STUDIES   PET 07/08/2016  IMPRESSION: 1. Large mass in the right lobe of the liver demonstrates diffuse hypermetabolism, compatible with recurrent metastatic disease in the liver. 2. Multiple small pulmonary  nodules are very similar to the recent chest CT 06/17/2016. These do not demonstrate hypermetabolism on the PET images, however, these lesions are well below the level of PET imaging. Given that these nodules are new compared to prior chest CT 12/18/2015, they are concerning for potential metastatic disease to the lungs, and continued attention on followup studies is recommended. 3. No other findings of metastatic disease elsewhere in the neck, chest, abdomen or pelvis. 4. There is some low-level hypermetabolic activity adjacent to the surgical clips in the lateral aspect of the right breast at the site of prior lumpectomy. This area has undergone interval involution over the several prior examinations, and this low-level activity is favored to be related to normal healing. Attention on follow-up imaging is recommended to ensure the stability or resolution of this finding. 5. Aortic atherosclerosis, in addition to 2 vessel coronary artery disease. Please note that although the presence of coronary artery calcium documents the presence of coronary artery disease, the severity of this disease and any potential stenosis cannot be assessed on this non-gated CT examination. Assessment for potential risk factor modification, dietary therapy or pharmacologic therapy may be warranted, if clinically indicated. 6. Additional incidental findings, as above.  MM DIAG BREAST TOMO BILATERAL 10/17/2016 FINDINGS: There are bilateral lumpectomy changes in the upper central left breast and upper central right breast. No mass, nonsurgical distortion, or suspicious microcalcification is identified in either breast to suggest malignancy. Mammographic images were processed with CAD. IMPRESSION: No evidence of malignancy in either breast. Lumpectomy changes bilaterally. RECOMMENDATION: Diagnostic mammogram is suggested in 1 year. (Code:DM-B-01Y)  CT CHEST, ABDOMEN PELVIS W CONTRAST 10/24/16 IMPRESSION: 1.  The numerous tiny bilateral pulmonary nodules seen on the previous imaging exams are no longer evident, suggesting response of therapy. 2. Large ill-defined irregular right hepatic lesion measures smaller on today's study. 3. Slight increase in size of hepatoduodenal ligament lymphadenopathy. 4. Abdominal Aortic Atherosclerois (ICD10-170.0)   ASSESSMENT: Veronica Reyes 61 y.o. female   1. Colon Cancer, Stage I (pT2N0), MMR normal, oligo recurrent disease in liver in 12/2014, KRAS mutation (+), liver and lung recurrence in 06/2016 -I previously reviewed the nature history of metastatic colon cancer, and the potential curative approach with chemotherapy followed by liver lesion ablation or resection. However, more likely, this is not curable disease. -She initially received 3 cycles of [place], does not want to proceed with fourth cycle due to the moderate side effects from previous chemotherapy treatment. She declined liver mass resection, and underwent liver ablation by interventional radiologist Dr. Earleen Newport in 08/2015. - I previously reviewed her restaging CT scan from 06/17/2016, which unfortunately showed new area of infiltrative disease as the previous ablated liver lesion, likely recurrence. She also developed several new pulmonary nodules, small, concerning for metastatic disease. 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 --She has started chemotherapy FOLFIRI and avastin, has been tolerating well, we'll continue every 2 weeks. -Her CEA has significantly decreased since she started chemotherapy, likely responding to her good response to chemotherapy. -Repeat CT scan on 10/23/16 showed resolution of numerous bilateral pulmonary nodules, the right hepatic lesion is smaller, and slight decrease in size of hepatoduodenal ligament lymphadenopathy. No other new lesions  --She tolerating chemotherapy well overall, I encourage her to  continue chemotherapy for at least 3 more months.  Patient wants to be off chemo if possible, and she is interested in liver targeted therapy if it's possible down the road. -The patient will be going out of town to a funeral and wedding. We will try to accommodate her schedule.  2. Right breast cancer, pT1bN0M0, stage Ia, ER+/PR+/HER2-, History of left Breast Cancer, diagnosed on 02/01/2015 --Left CIS in situ, s/p lumpectomy in 2006 and 2011 by Dr. Margot Chimes, and radiation in 2011 by Dr. Sondra Come.  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 Surgical margins were negative. -I previously reviewed her Oncotype DX result. Her recurrence score is 22, which predicts 14% 10 year risk of distant recurrence with tamoxifen alone. This is intermediate risk, based on her relatively low number, I do not recommend adjuvant chemotherapy. -She has completed adjuvant breast radiation -her Bureau level supports she is postmenopausal -She has started adjuvant letrozole, we'll continue. -We previously discussed breast cancer surveillance, I strongly encouraged her to continue annual screening mammogram, self exam, and follow-up with [place] routinely. -her recent mammogram was normal 10/2016  3. History of PE (01/06/2014) --She has a diagnosis of pulmonary embolism, likely secondary to malignancy in the setting of family history for stroke (and possible stroke history in patient as well). Lower extremity dopplers negative.  -continue Xarelto. Due to her metastatic colon cancer, I recommend her to continue indefinitely.  4.  Uterine Fibroids  --Negative endometrial biopsy. Following with Gynecology.  5. Kidney lesion (Right). -No hypermetabolic right kidney lesion on the pet scan -Repeat abdominal MRI 05/29/2015 showed unchanged right renal lesion, favored to represent a complex/septated cyst.  6. Right flank pain - possible related to her  liver metastasis -improved with chemo  -She'll continue Tylenol No. 4 as needed  7.  Morbid obesity - I encouraged her to eat healthy and exercise more. - I encouraged her to participate the " living well"  Program in our cancer center,  Which is designed for breast cancer survivor to improve your diet and exercise.  8. HTN  -The patient's blood pressure is high today. Could be related to the Avastin. -She does not have a monitor at home. I advised her to monitor at home. She will continue following up with her PCP  Plan  -I refilled Tylenol #4 today. -Scan and lab reviewed, will continue chemo, due to her travel needs, will adjust her date of treatment and hold 5-fu for next cycle  -Lab, flush, chemo irinotecan and avastin on 11/03/16 -Lab, flush, f/u and chemo FOLFIRI and avastin on 11/20/16.  All questions were answered. The patient knows to call the clinic with any problems, questions or concerns. We can certainly see the patient much sooner if necessary.  I spent 25 minutes counseling the patient face to face. The total time spent in the appointment was 30 minutes.  I have reviewed the above documentation for accuracy and completeness, and I agree with the above information.      [name]  10/24/2016   This document serves as a record of services personally performed by [name], MD. It was created on her behalf by Darcus Austin, a trained medical scribe. The creation of this record is based on the scribe's personal observations and the provider's statements to them. This document has been checked and approved by the attending provider.

## 2016-10-24 ENCOUNTER — Encounter: Payer: Self-pay | Admitting: Hematology

## 2016-10-24 ENCOUNTER — Ambulatory Visit (HOSPITAL_COMMUNITY)
Admission: RE | Admit: 2016-10-24 | Discharge: 2016-10-24 | Disposition: A | Discharge status: Home / Self Care | Payer: BLUE CROSS/BLUE SHIELD | Source: Ambulatory Visit | Attending: Hematology | Admitting: Hematology

## 2016-10-24 ENCOUNTER — Encounter (HOSPITAL_COMMUNITY): Payer: Self-pay

## 2016-10-24 ENCOUNTER — Ambulatory Visit (HOSPITAL_COMMUNITY): Payer: BLUE CROSS/BLUE SHIELD

## 2016-10-24 ENCOUNTER — Ambulatory Visit (HOSPITAL_BASED_OUTPATIENT_CLINIC_OR_DEPARTMENT_OTHER): Payer: BLUE CROSS/BLUE SHIELD | Admitting: Hematology

## 2016-10-24 VITALS — BP 141/98 | HR 77 | Temp 98.9°F | Resp 18 | Ht 66.0 in | Wt 272.6 lb

## 2016-10-24 DIAGNOSIS — I10 Essential (primary) hypertension: Secondary | ICD-10-CM

## 2016-10-24 DIAGNOSIS — R918 Other nonspecific abnormal finding of lung field: Secondary | ICD-10-CM | POA: Diagnosis not present

## 2016-10-24 DIAGNOSIS — C50511 Malignant neoplasm of lower-outer quadrant of right female breast: Secondary | ICD-10-CM | POA: Diagnosis present

## 2016-10-24 DIAGNOSIS — K769 Liver disease, unspecified: Secondary | ICD-10-CM | POA: Insufficient documentation

## 2016-10-24 DIAGNOSIS — I700 Atherosclerosis of aorta: Secondary | ICD-10-CM | POA: Diagnosis not present

## 2016-10-24 DIAGNOSIS — R590 Localized enlarged lymph nodes: Secondary | ICD-10-CM | POA: Insufficient documentation

## 2016-10-24 DIAGNOSIS — C187 Malignant neoplasm of sigmoid colon: Secondary | ICD-10-CM | POA: Diagnosis not present

## 2016-10-24 DIAGNOSIS — Z170 Estrogen receptor positive status [ER+]: Secondary | ICD-10-CM | POA: Insufficient documentation

## 2016-10-24 DIAGNOSIS — E66813 Obesity, class 3: Secondary | ICD-10-CM

## 2016-10-24 DIAGNOSIS — Z86718 Personal history of other venous thrombosis and embolism: Secondary | ICD-10-CM

## 2016-10-24 MED ORDER — ACETAMINOPHEN-CODEINE #4 300-60 MG PO TABS: 1.0000 | ORAL_TABLET | Freq: Three times a day (TID) | ORAL | 0 refills | Status: DC | PRN

## 2016-10-24 MED ORDER — IOPAMIDOL (ISOVUE-300) INJECTION 61%
100.0000 mL | Freq: Once | INTRAVENOUS | Status: AC | PRN
  Administered 2016-10-24: 100 mL via INTRAVENOUS

## 2016-10-24 MED ORDER — IOPAMIDOL (ISOVUE-300) INJECTION 61%
INTRAVENOUS | Status: AC
  Filled 2016-10-24: qty 100

## 2016-10-24 MED ORDER — SODIUM CHLORIDE 0.9 % IJ SOLN
INTRAMUSCULAR | Status: AC
  Filled 2016-10-24: qty 50

## 2016-11-04 ENCOUNTER — Ambulatory Visit: Payer: BLUE CROSS/BLUE SHIELD

## 2016-11-04 ENCOUNTER — Other Ambulatory Visit (HOSPITAL_BASED_OUTPATIENT_CLINIC_OR_DEPARTMENT_OTHER): Payer: BLUE CROSS/BLUE SHIELD

## 2016-11-04 ENCOUNTER — Ambulatory Visit (HOSPITAL_BASED_OUTPATIENT_CLINIC_OR_DEPARTMENT_OTHER): Payer: BLUE CROSS/BLUE SHIELD

## 2016-11-04 ENCOUNTER — Other Ambulatory Visit: Payer: Self-pay | Admitting: *Deleted

## 2016-11-04 VITALS — BP 137/80 | HR 60 | Temp 98.7°F | Resp 16

## 2016-11-04 DIAGNOSIS — Z5112 Encounter for antineoplastic immunotherapy: Secondary | ICD-10-CM | POA: Diagnosis not present

## 2016-11-04 DIAGNOSIS — C187 Malignant neoplasm of sigmoid colon: Secondary | ICD-10-CM | POA: Diagnosis not present

## 2016-11-04 DIAGNOSIS — Z5111 Encounter for antineoplastic chemotherapy: Secondary | ICD-10-CM

## 2016-11-04 DIAGNOSIS — C50511 Malignant neoplasm of lower-outer quadrant of right female breast: Secondary | ICD-10-CM

## 2016-11-04 DIAGNOSIS — Z95828 Presence of other vascular implants and grafts: Secondary | ICD-10-CM

## 2016-11-04 DIAGNOSIS — Z170 Estrogen receptor positive status [ER+]: Secondary | ICD-10-CM

## 2016-11-04 DIAGNOSIS — C787 Secondary malignant neoplasm of liver and intrahepatic bile duct: Secondary | ICD-10-CM | POA: Diagnosis not present

## 2016-11-04 LAB — COMPREHENSIVE METABOLIC PANEL
ALT: 34 U/L (ref 0–55)
ANION GAP: 11 meq/L (ref 3–11)
AST: 49 U/L — AB (ref 5–34)
Albumin: 3.4 g/dL — ABNORMAL LOW (ref 3.5–5.0)
Alkaline Phosphatase: 49 U/L (ref 40–150)
BILIRUBIN TOTAL: 0.53 mg/dL (ref 0.20–1.20)
BUN: 9.6 mg/dL (ref 7.0–26.0)
CHLORIDE: 103 meq/L (ref 98–109)
CO2: 24 meq/L (ref 22–29)
CREATININE: 0.8 mg/dL (ref 0.6–1.1)
Calcium: 9.9 mg/dL (ref 8.4–10.4)
EGFR: >90 mL/min/{1.73_m2} (ref >90)
Glucose: 101 mg/dl (ref 70–140)
Potassium: 3.6 mEq/L (ref 3.5–5.1)
Sodium: 138 mEq/L (ref 136–145)
TOTAL PROTEIN: 7.9 g/dL (ref 6.4–8.3)

## 2016-11-04 LAB — CBC WITH DIFFERENTIAL/PLATELET
BASO%: 0.8 % (ref 0.0–2.0)
Basophils Absolute: 0.1 10*3/uL (ref 0.0–0.1)
EOS%: 0.8 % (ref 0.0–7.0)
Eosinophils Absolute: 0.1 10*3/uL (ref 0.0–0.5)
HEMATOCRIT: 40.9 % (ref 34.8–46.6)
HGB: 13.7 g/dL (ref 11.6–15.9)
LYMPH#: 3.2 10*3/uL (ref 0.9–3.3)
LYMPH%: 33.9 % (ref 14.0–49.7)
MCH: 33.2 pg (ref 25.1–34.0)
MCHC: 33.5 g/dL (ref 31.5–36.0)
MCV: 99.1 fL (ref 79.5–101.0)
MONO#: 0.9 10*3/uL (ref 0.1–0.9)
MONO%: 9.2 % (ref 0.0–14.0)
NEUT%: 55.3 % (ref 38.4–76.8)
NEUTROS ABS: 5.3 10*3/uL (ref 1.5–6.5)
PLATELETS: 254 10*3/uL (ref 145–400)
RBC: 4.12 10*6/uL (ref 3.70–5.45)
RDW: 18.6 % — ABNORMAL HIGH (ref 11.2–14.5)
WBC: 9.6 10*3/uL (ref 3.9–10.3)

## 2016-11-04 LAB — UA PROTEIN, DIPSTICK - CHCC: Protein, ur: NEGATIVE mg/dL

## 2016-11-04 LAB — CEA (IN HOUSE-CHCC): CEA (CHCC-IN HOUSE): 9.94 ng/mL — AB (ref 0.00–5.00)

## 2016-11-04 MED ORDER — IRINOTECAN HCL CHEMO INJECTION 100 MG/5ML
180.0000 mg/m2 | Freq: Once | INTRAVENOUS | Status: AC
  Administered 2016-11-04: 440 mg via INTRAVENOUS
  Filled 2016-11-04: qty 15

## 2016-11-04 MED ORDER — ATROPINE SULFATE 1 MG/ML IJ SOLN
INTRAMUSCULAR | Status: AC
  Filled 2016-11-04: qty 1

## 2016-11-04 MED ORDER — SODIUM CHLORIDE 0.9% FLUSH
10.0000 mL | INTRAVENOUS | Status: DC | PRN
  Administered 2016-11-04: 10 mL
  Filled 2016-11-04: qty 10

## 2016-11-04 MED ORDER — ATROPINE SULFATE 1 MG/ML IJ SOLN
0.5000 mg | Freq: Once | INTRAMUSCULAR | Status: AC | PRN
  Administered 2016-11-04: 0.5 mg via INTRAVENOUS

## 2016-11-04 MED ORDER — HEPARIN SOD (PORK) LOCK FLUSH 100 UNIT/ML IV SOLN
500.0000 [IU] | Freq: Once | INTRAVENOUS | Status: AC | PRN
  Administered 2016-11-04: 500 [IU]
  Filled 2016-11-04: qty 5

## 2016-11-04 MED ORDER — SODIUM CHLORIDE 0.9 % IV SOLN
Freq: Once | INTRAVENOUS | Status: AC
  Administered 2016-11-04: 15:00:00 via INTRAVENOUS

## 2016-11-04 MED ORDER — PALONOSETRON HCL INJECTION 0.25 MG/5ML
INTRAVENOUS | Status: AC
  Filled 2016-11-04: qty 5

## 2016-11-04 MED ORDER — DEXAMETHASONE SODIUM PHOSPHATE 10 MG/ML IJ SOLN
INTRAMUSCULAR | Status: AC
  Filled 2016-11-04: qty 1

## 2016-11-04 MED ORDER — PALONOSETRON HCL INJECTION 0.25 MG/5ML
0.2500 mg | Freq: Once | INTRAVENOUS | Status: AC
  Administered 2016-11-04: 0.25 mg via INTRAVENOUS

## 2016-11-04 MED ORDER — DEXAMETHASONE SODIUM PHOSPHATE 10 MG/ML IJ SOLN
10.0000 mg | Freq: Once | INTRAMUSCULAR | Status: AC
  Administered 2016-11-04: 10 mg via INTRAVENOUS

## 2016-11-04 MED ORDER — SODIUM CHLORIDE 0.9% FLUSH
10.0000 mL | INTRAVENOUS | Status: DC | PRN
  Administered 2016-11-04: 10 mL via INTRAVENOUS
  Filled 2016-11-04: qty 10

## 2016-11-04 MED ORDER — SODIUM CHLORIDE 0.9 % IV SOLN
5.0000 mg/kg | Freq: Once | INTRAVENOUS | Status: AC
  Administered 2016-11-04: 650 mg via INTRAVENOUS
  Filled 2016-11-04: qty 16

## 2016-11-04 NOTE — Patient Instructions (Signed)
[facility] Discharge Instructions for Patients Receiving Chemotherapy  Today you received the following chemotherapy agents:  Avastin, Irinotecan  To help prevent nausea and vomiting after your treatment, we encourage you to take your nausea medication as prescribed.   If you develop nausea and vomiting that is not controlled by your nausea medication, call the clinic.   BELOW ARE SYMPTOMS THAT SHOULD BE REPORTED IMMEDIATELY:  *FEVER GREATER THAN 100.5 F  *CHILLS WITH OR WITHOUT FEVER  NAUSEA AND VOMITING THAT IS NOT CONTROLLED WITH YOUR NAUSEA MEDICATION  *UNUSUAL SHORTNESS OF BREATH  *UNUSUAL BRUISING OR BLEEDING  TENDERNESS IN MOUTH AND THROAT WITH OR WITHOUT PRESENCE OF ULCERS  *URINARY PROBLEMS  *BOWEL PROBLEMS  UNUSUAL RASH Items with * indicate a potential emergency and should be followed up as soon as possible.  Feel free to call the clinic you have any questions or concerns. The clinic phone number is [phone].  Please show the CHEMO ALERT CARD at check-in to the Emergency Department and triage nurse.   

## 2016-11-06 ENCOUNTER — Other Ambulatory Visit: Payer: Self-pay | Admitting: Hematology

## 2016-11-06 DIAGNOSIS — C187 Malignant neoplasm of sigmoid colon: Secondary | ICD-10-CM

## 2016-11-14 ENCOUNTER — Ambulatory Visit: Payer: BLUE CROSS/BLUE SHIELD | Admitting: Hematology

## 2016-11-19 NOTE — Progress Notes (Signed)
[facility] PROGRESS NOTE DATE OF VISIT: 11/22/2016   Veronica Docker, MD 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 Left breast DCIS diagnosed in 2006 and 2010, status post lumpectomy. 2. pT2N0M0 stage I colon cancer, s/p partial colectomy on 03/09/2014  3. PE in 12/2013 when she was diagnosed with colon cancer. 4. 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 [facility])   Staging form: Breast, AJCC 7th Edition     Clinical stage from 02/01/2015: Stage IA (T1a, N0, M0) - Signed by [name], MD on 06/04/2015     Pathologic stage from 11/10/2015: Stage IA (T1b, N0, M0) - Signed by [name], MD on 12/08/2015 Cancer of sigmoid colon [facility])   Staging form: Colon and Rectum, AJCC 7th Edition     Clinical: Stage I (T2, N0, M0) - Unsigned     Breast cancer of lower-outer quadrant of right female breast ([place])   2006 Cancer Diagnosis    Left breast DCIS diagnosed in 2006 and 2010, status post lumpectomy.      01/07/2014 Initial Diagnosis    Breast cancer      01/18/2015 Mammogram    60m mass in lower outer quadrant of right breast       02/01/2015 Initial Biopsy    (R) breast mass biopsy showed invasive ductal carcinoma & DCIS, grade 1-2.       02/01/2015 Receptors her2    ER 90%+, PR 10%+, Ki67 10%, HER2 negative (ratio 1.09)       11/07/2015 Surgery    (R) breast lumpectomy and sentinel lymph node biopsy ([name]      11/07/2015 Pathology  Results    (R) breast lumpectomy showed invasive ductal carcinoma, grade 2, 1 cm, low-grade DCIS, negative margins, 3 sentinel lymph nodes were negative. LVI (-). HER2 repeated and remains negative (ratio 1.29).        11/07/2015 Pathologic Stage    pT1b,pN0: Stage IA       11/07/2015 Oncotype testing    RS 22, which predicts a year risk of distant recurrence with tamoxifen alone 14% (intermediate-risk). Adjuvant chemo was not offered      02/21/2016 - 04/10/2016 Radiation Therapy    Adjuvant breast radiation. ([name]). Right breast: 50.4 Gy in 28 fractions.  Right breast boost: 12 Gy in 6 fractions.      05/2016 -  Anti-estrogen oral therapy    Femara 2.5 mg daily.       10/17/2016 Mammogram    MM DIAG BREAST TOMO BILATERAL 10/17/2016 FINDINGS: There are bilateral lumpectomy changes in the upper central left breast and upper central right breast. No mass, nonsurgical distortion, or suspicious microcalcification is identified in either breast to suggest malignancy. Mammographic images were processed with CAD. IMPRESSION: No evidence of malignancy in either breast. Lumpectomy changes bilaterally. RECOMMENDATION: Diagnostic mammogram is suggested in 1 year. (Code:DM-B-01Y)       Cancer of sigmoid colon ([name]   03/04/2014 Initial Diagnosis    Cancer of sigmoid colon  03/09/2014 Pathology Results    G1 invasive adenocarcinoma, no lymph-Vascular invasion or Peri-neural invasion: Absent. MMR normal, MSI stable.       03/09/2014 Surgery    partial colectomy, negative margins.       11/10/2014 Relapse/Recurrence    biopsy confirmed oligo liver recurrence       11/10/2014 Imaging    PET showed two small ares of hypermetabolism in the right lobe of the liver, no other distant metastasis.       11/30/2014 Imaging    abdomen MRI showed a new enhancing lesion which corresponds to an area of abnormal hyper metabolism on recent PET-CT. Stable enlarged portal caval lymph node.        01/02/2015 Pathology Results    Liver biopsy showed metastatic adenocarcinoma, IHC (+) for CK20, CDX-2, (-) for TTF-1 and ER      01/02/2015 Miscellaneous    liver biopsy KRAS mutation (+)       02/14/2015 Imaging    CT showed:  the small metastatic right hepatic lobe liver lesion is not identified for certain on this examination. No new lesions. Stable small cysts in segment 4 a.       03/05/2015 - 04/16/2015 Chemotherapy    CAPEOX (capecitabine 2500 mg twice daily Day 1-14, oxaliplatin 1 30 mg/m on day 1, every 21 days, S/P 3 cycles       08/24/2015 Procedure    CT-guided oligo liver metastasis microwave ablation by Dr. [name]      06/17/2016 Imaging    CT of the chest/abd/pelvis with contrast 1. Several new small pulmonary nodules worrisome for metastatic disease. 2. New large area of probable infiltrating recurrent tumor in the right hepatic lobe surrounding the prior ablation site. 3. Small supraclavicular lymph nodes.  Attention on future studies. 4. Stable surgical changes involving both breasts. No breast masses are identified. 5. Stable bilateral adrenal gland nodules. 6. Stable upper abdominal and right-sided retroperitoneal lymph nodes.      07/08/2016 PET scan    1. Large mass in the right lobe of the liver demonstrates diffuse hypermetabolism, compatible with recurrent metastatic disease in the liver 2. Multiple small pulmonary nodules are very similar to the recent Chest CT 06/17/16.  3. No other findings of metastatic disease elsewhere in the neck, chest, abdomen, or pelvis. 4. There is some low-level hypermetabolic activity adjacent to the surgical clips in the lateral aspect of the right breast at the site of prior lumpectomy.       07/29/2016 Pathology Results    Liver biopsy confirmed metastatic colon cancer       07/31/2016 -  Chemotherapy    FOLFIRI every 2 weeks, Avastin added from cycle 2       10/23/2016 Imaging    CT ABDOMEN PELVIS W CONTRAST  10/24/16 IMPRESSION: 1. The numerous tiny bilateral pulmonary nodules seen on the previous imaging exams are no longer evident, suggesting response of therapy. 2. Large ill-defined irregular right hepatic lesion measures smaller on today's study. 3. Slight increase in size of hepatoduodenal ligament lymphadenopathy. 4. Abdominal Aortic Atherosclerois (ICD10-170.0)        CURRENT TREATMENT:   1. Letrozole 2.5 mg once daily, started in September 2017 2. Chemotherapy FOLFIRI and Avastin (from cycle 2) every 2 weeks, started on 07/31/2016  INTERIM HISTORY: Veronica Reyes 61 y.o. female with a history of recurrent left breast cancer, colon cancer s/p laparoscopic-assisted partial colectomy (03/09/2014), PE (01/06/2014), and right breast cancer (01/2015),  presents to clinic for  follow up.   She returns for follow up. Patients has been sick since the last chemo. Does not feel well, very fatigued and doesn't want to continue with chemo today. Since previous chemo, patient reports diarrhea, nausea and fatigue. More Imodiumcan be taken and if that is not enough Lomotil can be prescribed instead. Denies fever.   MEDICAL HISTORY: Past Medical History:  Diagnosis Date  . Anxiety   . Breast CA ([place])    radiation and surgery-lt.  . Cancer ([place])    left breast cancer x2  . Colon cancer ([place])    Dr. Burr Medico seeing monthly  . Hypertension   . Liver lesion   . Pulmonary embolism ([place])    "blood clot in lungs" -dx. 4'15 with CT Chest. On Xarelto  . Radiation 01/22/10-03/12/10   left whole breast 4500 cGy, upper aspect boosted to 6120 cGy  . Radiation 02/21/16 - 04/10/16   right breast 50.4 Gy, right breast boost 12 Gy  . Shortness of breath    sob with exertion. dx. with Pulmonary emboli 4'15 ,tx. Xarelto,Lovenox used for [employer].    ALLERGIES:  is allergic to tramadol.  MEDICATIONS:  Allergies as of 11/20/2016      Reactions   Tramadol Other (See Comments)   Dizziness      Medication List        Accurate as of 11/20/16 11:59 PM. Always use your most recent med list.          acetaminophen-codeine 300-60 MG tablet Commonly known as:  TYLENOL #4 Take 1 tablet by mouth every 8 (eight) hours as needed. for pain   amLODipine 2.5 MG tablet Commonly known as:  NORVASC Take 2.5 mg by mouth daily.   ferrous sulfate 325 (65 FE) MG tablet Take 325 mg by mouth daily with breakfast. Pt takes  4 times per week.   hydrochlorothiazide 25 MG tablet Commonly known as:  HYDRODIURIL Take 1 tablet (25 mg total) by mouth every morning.   HYDROcodone-acetaminophen 5-325 MG tablet Commonly known as:  NORCO Take 1 tablet by mouth every 6 (six) hours as needed for moderate pain.   letrozole 2.5 MG tablet Commonly known as:  FEMARA Take 1 tablet (2.5 mg total) by mouth daily.   lidocaine-prilocaine cream Commonly known as:  EMLA Apply 1 application topically as needed.   metoprolol 50 MG tablet Commonly known as:  LOPRESSOR Take 1 tablet (50 mg total) by mouth 2 (two) times daily.   ondansetron 8 MG tablet Commonly known as:  ZOFRAN Take 1 tablet (8 mg total) by mouth 2 (two) times daily as needed for refractory nausea / vomiting. Start on day 3 after chemotherapy.   potassium chloride SA 20 MEQ tablet Commonly known as:  K-DUR,KLOR-CON Take 1 tablet (20 mEq total) by mouth daily.   prochlorperazine 10 MG tablet Commonly known as:  COMPAZINE Take 1 tablet (10 mg total) by mouth every 6 (six) hours as needed (NAUSEA).   XARELTO 20 MG Tabs tablet Generic drug:  rivaroxaban Take 1 tablet by mouth daily with supper   zolpidem 5 MG tablet Commonly known as:  AMBIEN Take 1 tablet (5 mg total) by mouth at bedtime as needed for sleep.       SURGICAL HISTORY:  Past Surgical History:  Procedure Laterality Date  . BREAST LUMPECTOMY WITH RADIOACTIVE SEED AND SENTINEL LYMPH NODE BIOPSY Right 11/07/2015   Procedure: BREAST LUMPECTOMY WITH RADIOACTIVE SEED AND SENTINEL LYMPH NODE  BIOPSY;  Surgeon: Stark Klein, MD;  Location: [facility];  Service: General;  Laterality: Right;  . BREAST SURGERY     '11- Left breast lumpectomy  . CESAREAN SECTION    . CHOLECYSTECTOMY     Laparoscopic 20 yrs ago  . COLON SURGERY  03-09-14   partial colectomy for cancer  . IR GENERIC HISTORICAL  07/29/2016   IR FLUORO GUIDE PORT INSERTION RIGHT 07/29/2016 Sandi Mariscal, MD [facility]  . IR GENERIC HISTORICAL  07/29/2016   IR US GUIDE VASC ACCESS RIGHT 07/29/2016 Sandi Mariscal, MD [facility]  . IR GENERIC HISTORICAL  07/29/2016   IR US GUIDE BX ASP/DRAIN 07/29/2016 [facility]  . PARTIAL COLECTOMY N/A 03/09/2014   Procedure: LAPAROSCOPIC ASSISTED PARTIAL COLECTOMY, splenic flexure mobilization;  Surgeon: Leighton Ruff, MD;  Location: [facility];  Service: General;  Laterality: N/A;  . RADIOFREQUENCY ABLATION Right 08/24/2015   Procedure: MICROWAVE ABLATION RIGHT LIVER LOBE ;  Surgeon: Corrie Mckusick, DO;  Location: [facility];  Service: Anesthesiology;  Laterality: Right;    REVIEW OF SYSTEMS:   Constitutional: Denies fevers, chills or abnormal weight loss (+) fatigue Eyes: Denies blurriness of vision Ears, nose, mouth, throat, and face: Denies mucositis or sore throat Respiratory: Denies cough, dyspnea or wheezes Cardiovascular: Denies palpitation, chest discomfort or lower extremity swelling Gastrointestinal:  Denies heartburn or change in bowel habits. (+) Nausea Skin: Denies abnormal skin rashes Musculoskeletal: (+) intermittent right-sided flank pain. Lymphatics: Denies new lymphadenopathy or easy bruising Neurological:Denies numbness, tingling or new weaknesses Musculoskeletal: (+) back pain Behavioral/Psych: Mood is stable, no new changes  All other systems were reviewed with the patient and are negative.    PHYSICAL EXAMINATION: ECOG PERFORMANCE STATUS: 2  Blood pressure (!) 152/88, pulse 71, temperature 99.1 F (37.3 C), temperature source Oral, resp. rate 18,  height '5\' 6"'  (1.676 m), weight 276 lb 9.6 oz (125.5 kg), last menstrual period 01/23/2014, SpO2 100 %.   GENERAL:alert, no distress and comfortable; well developed and obese. Easily mobile to exam table.  SKIN: skin color, texture, turgor are normal, no rashes or significant lesions EYES: normal, Conjunctiva are pink and non-injected, sclera clear OROPHARYNX:no exudate, no erythema and lips, buccal mucosa, and tongue normal  NECK: supple, thyroid normal size, non-tender, without nodularity LYMPH:  no palpable lymphadenopathy in the cervical, axillary or supraclavicular LUNGS: clear to auscultation with normal breathing effort, no wheezes or rhonchi HEART: regular rate & rhythm and no murmurs and no lower extremity edema ABDOMEN:abdomen soft, non-tender and normal bowel sounds; incision healing with signs of infection.  Mild tenderness at the  Right flank area, no abdominal tenderness. No local megaly Musculoskeletal:no cyanosis of digits and no clubbing, (+) tenderness at right flank area NEURO: alert & oriented x 3 with fluent speech, no focal motor/sensory deficits   Labs:  CBC Latest Ref Rng & Units 11/20/2016 11/04/2016 10/09/2016  WBC 3.9 - 10.3 10e3/uL 7.0 9.6 6.2  Hemoglobin 11.6 - 15.9 g/dL 12.9 13.7 12.8  Hematocrit 34.8 - 46.6 % 38.4 40.9 37.7  Platelets 145 - 400 10e3/uL 245 254 255    CMP Latest Ref Rng & Units 11/20/2016 11/04/2016 10/09/2016  Glucose 70 - 140 mg/dl 100 101 99  BUN 7.0 - 26.0 mg/dL 10.6 9.6 10.7  Creatinine 0.6 - 1.1 mg/dL 0.7 0.8 0.8  Sodium 136 - 145 mEq/L 141 138 139  Potassium 3.5 - 5.1 mEq/L 3.2(L) 3.6 3.8  Chloride 101 - 111 mmol/L - - -  CO2 22 - 29 mEq/L '27 24 27  ' Calcium  8.4 - 10.4 mg/dL 9.6 9.9 9.7  Total Protein 6.4 - 8.3 g/dL 7.3 7.9 7.7  Total Bilirubin 0.20 - 1.20 mg/dL 0.44 0.53 0.52  Alkaline Phos 40 - 150 U/L 36(L) 49 50  AST 5 - 34 U/L 36(H) 49(H) 41(H)  ALT 0 - 55 U/L 27 34 33   CEA  01/18/2014: 0.8 03/01/2015: 1.3 01/31/2016:  4.8 05/28/2016: 48 07/31/2016: 173.7 08/28/16: 139 09/25/2016: 43 11/04/16: 9.94  PATHOLOGY RESULT Diagnosis 01/02/2015 Liver, needle/core biopsy, right - POSITIVE FOR METASTATIC ADENOCARCINOMA. - SEE COMMENT.   Diagnosis 11/07/2015 1. Breast, lumpectomy, Right - INVASIVE DUCTAL CARCINOMA, GRADE 2, SPANNING 1 CM. - DUCTAL CARCINOMA IN SITU, LOW GRADE. - RESECTION MARGINS ARE NEGATIVE FOR INVASIVE CARCINOMA. - DUCTAL CARCINOMA IN SITU COMES TO WITHIN 0.3 CM OF THE POSTERIOR MARGIN. - BIOPSY SITE. - SEE ONCOLOGY TABLE. 2. Lymph node, sentinel, biopsy, right axillary #1 - ONE OF ONE LYMPH NODES NEGATIVE FOR CARCINOMA (0/1). 3. Lymph node, sentinel, biopsy, right axillary #2 - ONE OF ONE LYMPH NODES NEGATIVE FOR CARCINOMA (0/1). 4. Lymph node, sentinel, biopsy, right axillary #3 - ONE OF ONE LYMPH NODES NEGATIVE FOR CARCINOMA (0/1).  ONCOTYPE DX: RS 50, which predicts a year risk of distant recurrence with tamoxifen alone 14%   Diagnosis 07/29/2016 Liver, needle/core biopsy, posterior of right lobe METASTATIC ADENOCARCINOMA, CONSISTENT WITH COLONIC PRIMARY.  RADIOLOGY STUDIES   PET 07/08/2016 IMPRESSION: 1. Large mass in the right lobe of the liver demonstrates diffuse hypermetabolism, compatible with recurrent metastatic disease in the liver. 2. Multiple small pulmonary nodules are very similar to the recent chest CT 06/17/2016. These do not demonstrate hypermetabolism on the PET images, however, these lesions are well below the level of PET imaging. Given that these nodules are new compared to prior chest CT 12/18/2015, they are concerning for potential metastatic disease to the lungs, and continued attention on followup studies is recommended. 3. No other findings of metastatic disease elsewhere in the neck, chest, abdomen or pelvis. 4. There is some low-level hypermetabolic activity adjacent to the surgical clips in the lateral aspect of the right breast at the  site of prior lumpectomy. This area has undergone interval involution over the several prior examinations, and this low-level activity is favored to be related to normal healing. Attention on follow-up imaging is recommended to ensure the stability or resolution of this finding. 5. Aortic atherosclerosis, in addition to 2 vessel coronary artery disease. Please note that although the presence of coronary artery calcium documents the presence of coronary artery disease, the severity of this disease and any potential stenosis cannot be assessed on this non-gated CT examination. Assessment for potential risk factor modification, dietary therapy or pharmacologic therapy may be warranted, if clinically indicated. 6. Additional incidental findings, as above.  MM DIAG BREAST TOMO BILATERAL 10/17/2016 FINDINGS: There are bilateral lumpectomy changes in the upper central left breast and upper central right breast. No mass, nonsurgical distortion, or suspicious microcalcification is identified in either breast to suggest malignancy. Mammographic images were processed with CAD. IMPRESSION: No evidence of malignancy in either breast. Lumpectomy changes bilaterally. RECOMMENDATION: Diagnostic mammogram is suggested in 1 year. (Code:DM-B-01Y)  CT CHEST, ABDOMEN PELVIS W CONTRAST 10/24/16 IMPRESSION: 1. The numerous tiny bilateral pulmonary nodules seen on the previous imaging exams are no longer evident, suggesting response of therapy. 2. Large ill-defined irregular right hepatic lesion measures smaller on today's study. 3. Slight increase in size of hepatoduodenal ligament lymphadenopathy. 4. Abdominal Aortic Atherosclerois (ICD10-170.0)   ASSESSMENT: Veronica Reyes  Veronica Reyes 61 y.o. female   1. Colon Cancer, Stage I (pT2N0), MMR normal, oligo recurrent disease in liver in 12/2014, KRAS mutation (+), liver and lung recurrence in 06/2016 -I previously reviewed the nature history of metastatic colon cancer,  and the potential curative approach with chemotherapy followed by liver lesion ablation or resection. However, more likely, this is not curable disease. -She initially received 3 cycles of [place]ox, does not want to proceed with fourth cycle due to the moderate side effects from previous chemotherapy treatment. She declined liver mass resection, and underwent liver ablation by interventional radiologist Dr. Earleen Newport in 08/2015. - I previously reviewed her restaging CT scan from 06/17/2016, which unfortunately showed new area of infiltrative disease as the previous ablated liver lesion, likely recurrence. She also developed several new pulmonary nodules, small, concerning for metastatic disease. 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 --She has started chemotherapy FOLFIRI and avastin, has been tolerating well, we'll continue every 2 weeks. -Her CEA has significantly decreased since she started chemotherapy, likely responding to her good response to chemotherapy. -Repeat CT scan on 10/23/16 showed resolution of numerous bilateral pulmonary nodules, the right hepatic lesion is smaller, and slight decrease in size of hepatoduodenal ligament lymphadenopathy. No other new lesions  --She was tolerating chemotherapy well overall until recently, I encourage her to continue chemotherapy for at least 3 more months. Patient wants to be off chemo if possible, and she is interested in liver targeted therapy if it's possible down the road. - Patient requested chemo be postponed to next week, she has not recovered well from last cycle chemo. We discussed and agreed treatment will continue on 11/27/16 with a 15-20% dose reduction of irinotecan.  2. Right breast cancer, pT1bN0M0, stage Ia, ER+/PR+/HER2-, History of left Breast Cancer, diagnosed on 02/01/2015 --Left CIS in situ, s/p lumpectomy in 2006 and 2011 by Dr. Margot Chimes, and radiation in 2011 by Dr. Sondra Come.   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 Surgical margins were negative. -I previously reviewed her Oncotype DX result. Her recurrence score is 22, which predicts 14% 10 year risk of distant recurrence with tamoxifen alone. This is intermediate risk, based on her relatively low number, I do not recommend adjuvant chemotherapy. -She has completed adjuvant breast radiation -her [place] level supports she is postmenopausal -She has started adjuvant letrozole, we'll continue. -We previously discussed breast cancer surveillance, I strongly encouraged her to continue annual screening mammogram, self exam, and follow-up with [place] routinely. -her recent mammogram was normal 10/2016  3. History of PE (01/06/2014) --She has a diagnosis of pulmonary embolism, likely secondary to malignancy in the setting of family history for stroke (and possible stroke history in patient as well). Lower extremity dopplers negative.  -continue Xarelto. Due to her metastatic colon cancer, I recommended her to continue indefinitely.  4.  Uterine Fibroids  --Negative endometrial biopsy. Following with Gynecology.  5. Kidney lesion (Right). -No hypermetabolic right kidney lesion on the pet scan -Repeat abdominal MRI 05/29/2015 showed unchanged right renal lesion, favored to represent a complex/septated cyst.  6. Right flank pain - possible related to her liver metastasis -improved with chemo  -She'll continue Tylenol No. 4 as needed  7.  Morbid obesity - I have previously encouraged her to eat healthy and exercise more. - I have previously encouraged her to participate in the " living well"  Program in  our cancer center,  which is designed for breast cancer survivor to improve your diet and exercise.  8. HTN  -The patient's blood pressure is slightly elevated today. Could be related to the Avastin. -She does not have a monitor at  home. I have advised her to monitor at home. She will continue following up with her PCP  Plan  - I provided Compazine refill  -  Patient requested chemo be postponed to next week. We discussed and agreed treatment will continue on 11/27/16 with dose reduction of irinotecan to 127m/m2 and skip 5-fu bolus - lab, flush and chemo on 11/27/16 - f/u in 3 weeks with chemo, lab and flush  All questions were answered. The patient knows to call the clinic with any problems, questions or concerns. We can certainly see the patient much sooner if necessary.  I spent 20 minutes counseling the patient face to face. The total time spent in the appointment was 25 minutes.  This document serves as a record of services personally performed by YTruitt Merle MD. It was created on her behalf by SMaryla Morrow a trained medical scribe. The creation of this record is based on the scribe's personal observations and the provider's statements to them. This document has been checked and approved by the attending provider.  I have reviewed the above documentation for accuracy and completeness, and I agree with the above information.      FTruitt Merle 11/22/2016

## 2016-11-20 ENCOUNTER — Telehealth: Payer: Self-pay

## 2016-11-20 ENCOUNTER — Ambulatory Visit: Payer: BLUE CROSS/BLUE SHIELD

## 2016-11-20 ENCOUNTER — Ambulatory Visit (HOSPITAL_BASED_OUTPATIENT_CLINIC_OR_DEPARTMENT_OTHER): Payer: BLUE CROSS/BLUE SHIELD | Admitting: Hematology

## 2016-11-20 ENCOUNTER — Other Ambulatory Visit (HOSPITAL_BASED_OUTPATIENT_CLINIC_OR_DEPARTMENT_OTHER): Payer: BLUE CROSS/BLUE SHIELD

## 2016-11-20 VITALS — BP 152/88 | HR 71 | Temp 99.1°F | Resp 18 | Ht 66.0 in | Wt 276.6 lb

## 2016-11-20 DIAGNOSIS — Z95828 Presence of other vascular implants and grafts: Secondary | ICD-10-CM

## 2016-11-20 DIAGNOSIS — Z170 Estrogen receptor positive status [ER+]: Secondary | ICD-10-CM

## 2016-11-20 DIAGNOSIS — C187 Malignant neoplasm of sigmoid colon: Secondary | ICD-10-CM

## 2016-11-20 DIAGNOSIS — C50511 Malignant neoplasm of lower-outer quadrant of right female breast: Secondary | ICD-10-CM

## 2016-11-20 DIAGNOSIS — I10 Essential (primary) hypertension: Secondary | ICD-10-CM | POA: Diagnosis not present

## 2016-11-20 DIAGNOSIS — Z86718 Personal history of other venous thrombosis and embolism: Secondary | ICD-10-CM

## 2016-11-20 LAB — CBC WITH DIFFERENTIAL/PLATELET
BASO%: 0.4 % (ref 0.0–2.0)
Basophils Absolute: 0 10*3/uL (ref 0.0–0.1)
EOS ABS: 0.2 10*3/uL (ref 0.0–0.5)
EOS%: 3 % (ref 0.0–7.0)
HEMATOCRIT: 38.4 % (ref 34.8–46.6)
HEMOGLOBIN: 12.9 g/dL (ref 11.6–15.9)
LYMPH#: 2.6 10*3/uL (ref 0.9–3.3)
LYMPH%: 37.7 % (ref 14.0–49.7)
MCH: 32.6 pg (ref 25.1–34.0)
MCHC: 33.6 g/dL (ref 31.5–36.0)
MCV: 97 fL (ref 79.5–101.0)
MONO#: 0.5 10*3/uL (ref 0.1–0.9)
MONO%: 6.8 % (ref 0.0–14.0)
NEUT%: 52.1 % (ref 38.4–76.8)
NEUTROS ABS: 3.7 10*3/uL (ref 1.5–6.5)
NRBC: 0 % (ref 0–0)
PLATELETS: 245 10*3/uL (ref 145–400)
RBC: 3.96 10*6/uL (ref 3.70–5.45)
RDW: 15.8 % — ABNORMAL HIGH (ref 11.2–14.5)
WBC: 7 10*3/uL (ref 3.9–10.3)

## 2016-11-20 LAB — COMPREHENSIVE METABOLIC PANEL
ALBUMIN: 3.1 g/dL — AB (ref 3.5–5.0)
ALK PHOS: 36 U/L — AB (ref 40–150)
ALT: 27 U/L (ref 0–55)
AST: 36 U/L — AB (ref 5–34)
Anion Gap: 9 mEq/L (ref 3–11)
BILIRUBIN TOTAL: 0.44 mg/dL (ref 0.20–1.20)
BUN: 10.6 mg/dL (ref 7.0–26.0)
CALCIUM: 9.6 mg/dL (ref 8.4–10.4)
CO2: 27 mEq/L (ref 22–29)
CREATININE: 0.7 mg/dL (ref 0.6–1.1)
Chloride: 105 mEq/L (ref 98–109)
EGFR: >90 mL/min/{1.73_m2} (ref >90)
Glucose: 100 mg/dl (ref 70–140)
Potassium: 3.2 mEq/L — ABNORMAL LOW (ref 3.5–5.1)
Sodium: 141 mEq/L (ref 136–145)
TOTAL PROTEIN: 7.3 g/dL (ref 6.4–8.3)

## 2016-11-20 MED ORDER — ACETAMINOPHEN-CODEINE #4 300-60 MG PO TABS: 1.0000 | ORAL_TABLET | Freq: Three times a day (TID) | ORAL | 0 refills | Status: DC | PRN

## 2016-11-20 MED ORDER — HEPARIN SOD (PORK) LOCK FLUSH 100 UNIT/ML IV SOLN
500.0000 [IU] | Freq: Once | INTRAVENOUS | Status: AC | PRN
  Administered 2016-11-20: 500 [IU] via INTRAVENOUS
  Filled 2016-11-20: qty 5

## 2016-11-20 MED ORDER — SODIUM CHLORIDE 0.9% FLUSH
10.0000 mL | INTRAVENOUS | Status: DC | PRN
  Administered 2016-11-20: 10 mL via INTRAVENOUS
  Filled 2016-11-20: qty 10

## 2016-11-20 MED ORDER — PROCHLORPERAZINE MALEATE 10 MG PO TABS: 10.0000 mg | ORAL_TABLET | Freq: Four times a day (QID) | ORAL | 2 refills | Status: AC | PRN

## 2016-11-20 NOTE — Patient Instructions (Signed)
Implanted Port Home Guide An implanted port is a type of central line that is placed under the skin. Central lines are used to provide IV access when treatment or nutrition needs to be given through a person's veins. Implanted ports are used for long-term IV access. An implanted port may be placed because:  You need IV medicine that would be irritating to the small veins in your hands or arms.  You need long-term IV medicines, such as antibiotics.  You need IV nutrition for a long period.  You need frequent blood draws for lab tests.  You need dialysis.  Implanted ports are usually placed in the chest area, but they can also be placed in the upper arm, the abdomen, or the leg. An implanted port has two main parts:  Reservoir. The reservoir is round and will appear as a small, raised area under your skin. The reservoir is the part where a needle is inserted to give medicines or draw blood.  Catheter. The catheter is a thin, flexible tube that extends from the reservoir. The catheter is placed into a large vein. Medicine that is inserted into the reservoir goes into the catheter and then into the vein.  How will I care for my incision site? Do not get the incision site wet. Bathe or shower as directed by your health care provider. How is my port accessed? Special steps must be taken to access the port:  Before the port is accessed, a numbing cream can be placed on the skin. This helps numb the skin over the port site.  Your health care provider uses a sterile technique to access the port. ? Your health care provider must put on a mask and sterile gloves. ? The skin over your port is cleaned carefully with an antiseptic and allowed to dry. ? The port is gently pinched between sterile gloves, and a needle is inserted into the port.  Only "non-coring" port needles should be used to access the port. Once the port is accessed, a blood return should be checked. This helps ensure that the port  is in the vein and is not clogged.  If your port needs to remain accessed for a constant infusion, a clear (transparent) bandage will be placed over the needle site. The bandage and needle will need to be changed every week, or as directed by your health care provider.  Keep the bandage covering the needle clean and dry. Do not get it wet. Follow your health care provider's instructions on how to take a shower or bath while the port is accessed.  If your port does not need to stay accessed, no bandage is needed over the port.  What is flushing? Flushing helps keep the port from getting clogged. Follow your health care provider's instructions on how and when to flush the port. Ports are usually flushed with saline solution or a medicine called heparin. The need for flushing will depend on how the port is used.  If the port is used for intermittent medicines or blood draws, the port will need to be flushed: ? After medicines have been given. ? After blood has been drawn. ? As part of routine maintenance.  If a constant infusion is running, the port may not need to be flushed.  How long will my port stay implanted? The port can stay in for as long as your health care provider thinks it is needed. When it is time for the port to come out, surgery will be   done to remove it. The procedure is similar to the one performed when the port was put in. When should I seek immediate medical care? When you have an implanted port, you should seek immediate medical care if:  You notice a bad smell coming from the incision site.  You have swelling, redness, or drainage at the incision site.  You have more swelling or pain at the port site or the surrounding area.  You have a fever that is not controlled with medicine.  This information is not intended to replace advice given to you by your health care provider. Make sure you discuss any questions you have with your health care provider. Document  Released: 09/01/2005 Document Revised: 02/07/2016 Document Reviewed: 05/09/2013 Elsevier Interactive Patient Education  2017 [employer]  

## 2016-11-20 NOTE — Telephone Encounter (Signed)
[facility]. Pt is there and asking what pain medication Dr Burr Medico was going to prescribe for her. Had pharmacy send pt home rather than wait for reply since Dr Burr Medico is seeing a patient right now.

## 2016-11-22 ENCOUNTER — Encounter: Payer: Self-pay | Admitting: Hematology

## 2016-11-27 ENCOUNTER — Ambulatory Visit: Payer: BLUE CROSS/BLUE SHIELD

## 2016-11-27 ENCOUNTER — Ambulatory Visit (HOSPITAL_BASED_OUTPATIENT_CLINIC_OR_DEPARTMENT_OTHER): Payer: BLUE CROSS/BLUE SHIELD

## 2016-11-27 ENCOUNTER — Ambulatory Visit: Payer: BLUE CROSS/BLUE SHIELD | Admitting: Hematology

## 2016-11-27 ENCOUNTER — Other Ambulatory Visit: Payer: Self-pay

## 2016-11-27 VITALS — BP 139/82 | HR 66 | Temp 98.3°F | Resp 18

## 2016-11-27 VITALS — BP 156/84 | HR 64 | Temp 99.1°F | Resp 18

## 2016-11-27 DIAGNOSIS — C787 Secondary malignant neoplasm of liver and intrahepatic bile duct: Secondary | ICD-10-CM

## 2016-11-27 DIAGNOSIS — C187 Malignant neoplasm of sigmoid colon: Secondary | ICD-10-CM | POA: Diagnosis not present

## 2016-11-27 DIAGNOSIS — Z5112 Encounter for antineoplastic immunotherapy: Secondary | ICD-10-CM | POA: Diagnosis not present

## 2016-11-27 DIAGNOSIS — Z5111 Encounter for antineoplastic chemotherapy: Secondary | ICD-10-CM

## 2016-11-27 DIAGNOSIS — Z95828 Presence of other vascular implants and grafts: Secondary | ICD-10-CM

## 2016-11-27 LAB — COMPREHENSIVE METABOLIC PANEL
ALBUMIN: 3.2 g/dL — AB (ref 3.5–5.0)
ALK PHOS: 41 U/L (ref 40–150)
ALT: 35 U/L (ref 0–55)
AST: 46 U/L — AB (ref 5–34)
Anion Gap: 10 mEq/L (ref 3–11)
BILIRUBIN TOTAL: 0.45 mg/dL (ref 0.20–1.20)
BUN: 8.4 mg/dL (ref 7.0–26.0)
CO2: 27 mEq/L (ref 22–29)
CREATININE: 0.8 mg/dL (ref 0.6–1.1)
Calcium: 9.7 mg/dL (ref 8.4–10.4)
Chloride: 104 mEq/L (ref 98–109)
EGFR: >90 mL/min/{1.73_m2} (ref >90)
GLUCOSE: 107 mg/dL (ref 70–140)
POTASSIUM: 3.6 meq/L (ref 3.5–5.1)
SODIUM: 141 meq/L (ref 136–145)
TOTAL PROTEIN: 7.5 g/dL (ref 6.4–8.3)

## 2016-11-27 LAB — CBC WITH DIFFERENTIAL/PLATELET
BASO%: 0.6 % (ref 0.0–2.0)
BASOS ABS: 0 10*3/uL (ref 0.0–0.1)
EOS ABS: 0.1 10*3/uL (ref 0.0–0.5)
EOS%: 1 % (ref 0.0–7.0)
HEMATOCRIT: 37.6 % (ref 34.8–46.6)
HEMOGLOBIN: 12.6 g/dL (ref 11.6–15.9)
LYMPH%: 32.8 % (ref 14.0–49.7)
MCH: 33 pg (ref 25.1–34.0)
MCHC: 33.5 g/dL (ref 31.5–36.0)
MCV: 98.3 fL (ref 79.5–101.0)
MONO#: 0.6 10*3/uL (ref 0.1–0.9)
MONO%: 8.3 % (ref 0.0–14.0)
NEUT%: 57.3 % (ref 38.4–76.8)
NEUTROS ABS: 4.2 10*3/uL (ref 1.5–6.5)
PLATELETS: 293 10*3/uL (ref 145–400)
RBC: 3.82 10*6/uL (ref 3.70–5.45)
RDW: 16.7 % — AB (ref 11.2–14.5)
WBC: 7.3 10*3/uL (ref 3.9–10.3)
lymph#: 2.4 10*3/uL (ref 0.9–3.3)

## 2016-11-27 MED ORDER — BEVACIZUMAB CHEMO INJECTION 400 MG/16ML
5.0000 mg/kg | Freq: Once | INTRAVENOUS | Status: AC
  Administered 2016-11-27: 650 mg via INTRAVENOUS
  Filled 2016-11-27: qty 16

## 2016-11-27 MED ORDER — ATROPINE SULFATE 1 MG/ML IJ SOLN
INTRAMUSCULAR | Status: AC
  Filled 2016-11-27: qty 1

## 2016-11-27 MED ORDER — SODIUM CHLORIDE 0.9 % IV SOLN
Freq: Once | INTRAVENOUS | Status: AC
  Administered 2016-11-27: 14:00:00 via INTRAVENOUS

## 2016-11-27 MED ORDER — ATROPINE SULFATE 1 MG/ML IJ SOLN
0.5000 mg | Freq: Once | INTRAMUSCULAR | Status: AC | PRN
  Administered 2016-11-27: 0.5 mg via INTRAVENOUS

## 2016-11-27 MED ORDER — DEXAMETHASONE SODIUM PHOSPHATE 10 MG/ML IJ SOLN
10.0000 mg | Freq: Once | INTRAMUSCULAR | Status: AC
  Administered 2016-11-27: 10 mg via INTRAVENOUS

## 2016-11-27 MED ORDER — SODIUM CHLORIDE 0.9% FLUSH
10.0000 mL | INTRAVENOUS | Status: DC | PRN
Start: 2016-11-27 — End: 2016-11-27
  Administered 2016-11-27: 10 mL via INTRAVENOUS
  Filled 2016-11-27: qty 10

## 2016-11-27 MED ORDER — MAGIC MOUTHWASH W/LIDOCAINE: 5.0000 mL | Freq: Four times a day (QID) | ORAL | 1 refills | Status: DC | PRN

## 2016-11-27 MED ORDER — PALONOSETRON HCL INJECTION 0.25 MG/5ML
INTRAVENOUS | Status: AC
  Filled 2016-11-27: qty 5

## 2016-11-27 MED ORDER — DEXAMETHASONE SODIUM PHOSPHATE 10 MG/ML IJ SOLN
INTRAMUSCULAR | Status: AC
  Filled 2016-11-27: qty 1

## 2016-11-27 MED ORDER — IRINOTECAN HCL CHEMO INJECTION 100 MG/5ML
160.0000 mg/m2 | Freq: Once | INTRAVENOUS | Status: AC
  Administered 2016-11-27: 400 mg via INTRAVENOUS
  Filled 2016-11-27: qty 15

## 2016-11-27 MED ORDER — PALONOSETRON HCL INJECTION 0.25 MG/5ML
0.2500 mg | Freq: Once | INTRAVENOUS | Status: AC
  Administered 2016-11-27: 0.25 mg via INTRAVENOUS

## 2016-11-27 MED ORDER — SODIUM CHLORIDE 0.9 % IV SOLN
2400.0000 mg/m2 | INTRAVENOUS | Status: DC
  Administered 2016-11-27: 5850 mg via INTRAVENOUS
  Filled 2016-11-27: qty 117

## 2016-11-27 MED ORDER — LEUCOVORIN CALCIUM INJECTION 350 MG
400.0000 mg/m2 | Freq: Once | INTRAVENOUS | Status: AC
  Administered 2016-11-27: 976 mg via INTRAVENOUS
  Filled 2016-11-27: qty 48.8

## 2016-11-27 NOTE — Patient Instructions (Signed)
[facility] Discharge Instructions for Patients Receiving Chemotherapy  Today you received the following chemotherapy agents Avastin, Irinotecan, Leucovorin and Adrucil   To help prevent nausea and vomiting after your treatment, we encourage you to take your nausea medication as directed. No Zofran for 3 days. Take Compazine instead.   If you develop nausea and vomiting that is not controlled by your nausea medication, call the clinic.   BELOW ARE SYMPTOMS THAT SHOULD BE REPORTED IMMEDIATELY:  *FEVER GREATER THAN 100.5 F  *CHILLS WITH OR WITHOUT FEVER  NAUSEA AND VOMITING THAT IS NOT CONTROLLED WITH YOUR NAUSEA MEDICATION  *UNUSUAL SHORTNESS OF BREATH  *UNUSUAL BRUISING OR BLEEDING  TENDERNESS IN MOUTH AND THROAT WITH OR WITHOUT PRESENCE OF ULCERS  *URINARY PROBLEMS  *BOWEL PROBLEMS  UNUSUAL RASH Items with * indicate a potential emergency and should be followed up as soon as possible.  Feel free to call the clinic you have any questions or concerns. The clinic phone number is [phone].  Please show the CHEMO ALERT CARD at check-in to the Emergency Department and triage nurse.   

## 2016-11-27 NOTE — Progress Notes (Signed)
Patient reports her mouth feels "raw". She states that this has persisted for 2 weeks now, but has gotten better. No sores/ulcers upon assessment. Desk nurse notified.   Per MD, OK to proceed with treatment.    Wylene Simmer, BSN, RN 11/27/2016 1:25 PM

## 2016-11-27 NOTE — Patient Instructions (Signed)
Implanted Port Home Guide An implanted port is a type of central line that is placed under the skin. Central lines are used to provide IV access when treatment or nutrition needs to be given through a person's veins. Implanted ports are used for long-term IV access. An implanted port may be placed because:  You need IV medicine that would be irritating to the small veins in your hands or arms.  You need long-term IV medicines, such as antibiotics.  You need IV nutrition for a long period.  You need frequent blood draws for lab tests.  You need dialysis.  Implanted ports are usually placed in the chest area, but they can also be placed in the upper arm, the abdomen, or the leg. An implanted port has two main parts:  Reservoir. The reservoir is round and will appear as a small, raised area under your skin. The reservoir is the part where a needle is inserted to give medicines or draw blood.  Catheter. The catheter is a thin, flexible tube that extends from the reservoir. The catheter is placed into a large vein. Medicine that is inserted into the reservoir goes into the catheter and then into the vein.  How will I care for my incision site? Do not get the incision site wet. Bathe or shower as directed by your health care provider. How is my port accessed? Special steps must be taken to access the port:  Before the port is accessed, a numbing cream can be placed on the skin. This helps numb the skin over the port site.  Your health care provider uses a sterile technique to access the port. ? Your health care provider must put on a mask and sterile gloves. ? The skin over your port is cleaned carefully with an antiseptic and allowed to dry. ? The port is gently pinched between sterile gloves, and a needle is inserted into the port.  Only "non-coring" port needles should be used to access the port. Once the port is accessed, a blood return should be checked. This helps ensure that the port  is in the vein and is not clogged.  If your port needs to remain accessed for a constant infusion, a clear (transparent) bandage will be placed over the needle site. The bandage and needle will need to be changed every week, or as directed by your health care provider.  Keep the bandage covering the needle clean and dry. Do not get it wet. Follow your health care provider's instructions on how to take a shower or bath while the port is accessed.  If your port does not need to stay accessed, no bandage is needed over the port.  What is flushing? Flushing helps keep the port from getting clogged. Follow your health care provider's instructions on how and when to flush the port. Ports are usually flushed with saline solution or a medicine called heparin. The need for flushing will depend on how the port is used.  If the port is used for intermittent medicines or blood draws, the port will need to be flushed: ? After medicines have been given. ? After blood has been drawn. ? As part of routine maintenance.  If a constant infusion is running, the port may not need to be flushed.  How long will my port stay implanted? The port can stay in for as long as your health care provider thinks it is needed. When it is time for the port to come out, surgery will be   done to remove it. The procedure is similar to the one performed when the port was put in. When should I seek immediate medical care? When you have an implanted port, you should seek immediate medical care if:  You notice a bad smell coming from the incision site.  You have swelling, redness, or drainage at the incision site.  You have more swelling or pain at the port site or the surrounding area.  You have a fever that is not controlled with medicine.  This information is not intended to replace advice given to you by your health care provider. Make sure you discuss any questions you have with your health care provider. Document  Released: 09/01/2005 Document Revised: 02/07/2016 Document Reviewed: 05/09/2013 Elsevier Interactive Patient Education  2017 [employer]  

## 2016-11-29 ENCOUNTER — Telehealth: Payer: Self-pay | Admitting: Hematology

## 2016-11-29 NOTE — Telephone Encounter (Signed)
Not able to reach patient at any of the numbers listed in EPIC or leave messages re next appointments. Will call patient again 3/19 and mail schedule. Also per 3/15 schedule message patient was to be scheduled for 3/17 pump d/c. Appointment not added - unsure if patient returned to clinic today for d/c. Will call again Monday and inform nursing if patient did not return.

## 2016-12-01 NOTE — Telephone Encounter (Signed)
Spoke with patient's spouse re next appointment for 3/29 - patient will get new schedule at 3/29 visit. Also per spouse patient did rtn to clinic on 3/17 for pump d/c. Infusion Charge nurse informed.

## 2016-12-05 ENCOUNTER — Telehealth: Payer: Self-pay | Admitting: *Deleted

## 2016-12-05 ENCOUNTER — Other Ambulatory Visit: Payer: Self-pay | Admitting: *Deleted

## 2016-12-05 ENCOUNTER — Telehealth: Payer: Self-pay | Admitting: Hematology

## 2016-12-05 MED ORDER — ACETAMINOPHEN-CODEINE #4 300-60 MG PO TABS: 1.0000 | ORAL_TABLET | Freq: Three times a day (TID) | ORAL | 0 refills | Status: DC | PRN

## 2016-12-05 MED ORDER — LETROZOLE 2.5 MG PO TABS: 2.5000 mg | ORAL_TABLET | Freq: Every day | ORAL | 2 refills | Status: DC

## 2016-12-05 NOTE — Telephone Encounter (Signed)
Confirmed appointments with patient.

## 2016-12-05 NOTE — Telephone Encounter (Signed)
Received call from pt stating that she will be out of town for [facility] & will not be able to make appt 12/11/16.  Informed Dr Burr Medico & OK with keeping appt on 12/18/16 & cancelling 3/29 appts.  She also asked for refills on her letrozole & Tylenol # 4.  She reports taking @ 2 tabs daily of Tylenol # 4 due to pain where liver ablation done.  She reports that the pain is better when she gets chemo but then comes back.  OK to refill both per Dr Burr Medico.

## 2016-12-07 IMAGING — CT NM PET TUM IMG INITIAL (PI) SKULL BASE T - THIGH
1 of 8 series · 1 of 25 positions shown · non-contrast
Comparison: CT of the abdomen and pelvis 03/04/2014.

CLINICAL DATA: Subsequent treatment strategy for colon cancer.
Staging examination.

EXAM:
NUCLEAR MEDICINE PET SKULL BASE TO THIGH
TECHNIQUE: 14.0 mCi F-18 FDG was injected intravenously. Full-ring PET imaging
was performed from the skull base to thigh after the radiotracer. CT
data was obtained and used for attenuation correction and anatomic
localization.
FASTING BLOOD GLUCOSE:  Value: 90 mg/dl

[Series 4: ct sk_thigh 5.0 hd_fov · axial · 5.0mm · 1.13mm/px · 1 of 220 slices shown]
[im 220/220  brain]
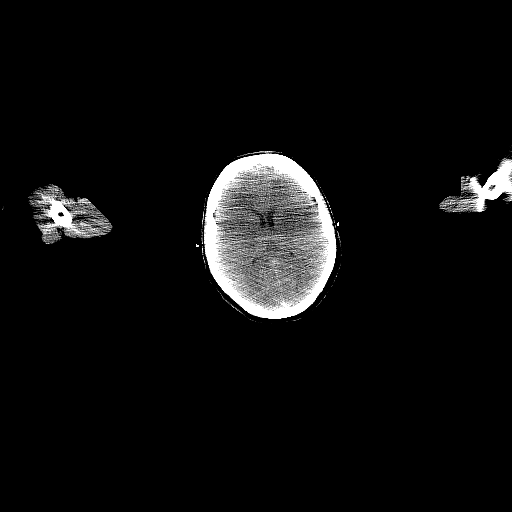

[1 of 25 positions shown; findings below may reference images not displayed]

FINDINGS: NECK

No hypermetabolic lymph nodes in the neck.

CHEST

No hypermetabolic mediastinal or hilar nodes. No suspicious
pulmonary nodules on the CT scan. There is an ill-defined area of
ground-glass attenuation in the medial aspect of the right lower
lobe adjacent several prominent marginal osteophytes in the thoracic
spine, which demonstrates some low-level hypermetabolism (SUVmax =
4.2), favored to be inflammatory. Heart size is mildly enlarged.
Dilatation of the pulmonic trunk (3.9 cm in diameter). There is
atherosclerosis of the thoracic aorta, the great vessels of the
mediastinum and the coronary arteries, including calcified
atherosclerotic plaque in the left anterior descending coronary
artery.

ABDOMEN/PELVIS

In the right lobe of the liver there are 2 small areas of
hypermetabolism, largest of which is in the periphery of segment 6
(image 107 of series 4) where there is subtle low-attenuation
measuring 1.8 cm and hypermetabolism (SUVmax = 4.9). In the medial
aspect of segment 7 there is also a very subtle 11 mm
low-attenuation area (image 98 of series 4) which is hypermetabolic
(SUVmax = 6.0). No abnormal hypermetabolic activity within the
pancreas, adrenal glands, or spleen. No hypermetabolic lymph nodes
in the abdomen or pelvis. Multiple low-attenuation lesions are again
noted in the kidneys bilaterally, incompletely characterized on
today's non contrast CT examination, but demonstrating no internal
hypermetabolism, likely cysts, largest of which measures 4.3 cm in
the lower pole of the right kidney. Postoperative changes of
sigmoidectomy are noted. There is hypermetabolism (SUVmax = 9.1) in
the rectum just distal to the suture line, which is rather diffuse
and favored to be physiologic or related to a proctitis. No definite
mass identified adjacent to the suture line to strongly suggest the
presence of local recurrence of disease.

SKELETON

No focal hypermetabolic activity to suggest skeletal metastasis.
IMPRESSION: 1. Status post sigmoidectomy with two small areas of hypermetabolism
in the right lobe of the liver, which are concerning for potential
metastatic lesions, as above. This could be confirmed with MRI of
the abdomen with and without IV gadolinium.
2. There is some relatively diffuse hypermetabolism in the rectum,
which is favored to be either physiologic or related to a proctitis.
No focal mass like soft tissue is noted adjacent to the suture line
to suggest local recurrence of disease.
3. Small focus of ground-glass attenuation in the medial aspect of
the right lower lobe, favored to reflect developing fibrosis related
to adjacent large marginal osteophytes in the thoracic spine
(reference: AJR Am Ailyn Mendosa. [DATE]):893-6.). There is
some low-level hypermetabolism in this region at this time, however,
this finding is strongly favored to be benign. Attention at time of
follow-up imaging is recommended to ensure the stability or
resolution of these findings.
4. Dilatation of the pulmonic trunk (3.9 cm in diameter), suggesting
pulmonary arterial hypertension.

## 2016-12-11 ENCOUNTER — Other Ambulatory Visit: Payer: BLUE CROSS/BLUE SHIELD

## 2016-12-11 ENCOUNTER — Ambulatory Visit: Payer: BLUE CROSS/BLUE SHIELD

## 2016-12-11 ENCOUNTER — Ambulatory Visit: Payer: BLUE CROSS/BLUE SHIELD | Admitting: Hematology

## 2016-12-11 NOTE — Progress Notes (Signed)
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 Left breast DCIS diagnosed in 2006 and 2010, status post lumpectomy. 2. pT2N0M0 stage I colon cancer, s/p partial colectomy on 03/09/2014  3. PE in 12/2013 when she was diagnosed with colon cancer. 4. 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 [facility])   Staging form: Breast, AJCC 7th Edition     Clinical stage from 02/01/2015: Stage IA (T1a, N0, M0) - Signed by [name], MD on 06/04/2015     Pathologic stage from 11/10/2015: Stage IA (T1b, N0, M0) - Signed by [name], MD on 12/08/2015 Cancer of sigmoid colon [facility])   Staging form: Colon and Rectum, AJCC 7th Edition     Clinical: Stage I (T2, N0, M0) - Unsigned     Breast cancer of lower-outer quadrant of right female breast ([place])   2006 Cancer Diagnosis    Left breast DCIS diagnosed in 2006 and 2010, status post lumpectomy.      01/07/2014 Initial Diagnosis    Breast cancer      01/18/2015 Mammogram    19m mass in lower outer quadrant of right breast       02/01/2015 Initial Biopsy    (R) breast mass biopsy showed invasive ductal carcinoma & DCIS, grade 1-2.       02/01/2015 Receptors her2    ER 90%+, PR 10%+, Ki67 10%, HER2 negative (ratio 1.09)       11/07/2015 Surgery    (R) breast lumpectomy and sentinel lymph node biopsy ([name]      11/07/2015 Pathology Results    (R) breast lumpectomy showed invasive ductal  carcinoma, grade 2, 1 cm, low-grade DCIS, negative margins, 3 sentinel lymph nodes were negative. LVI (-). HER2 repeated and remains negative (ratio 1.29).        11/07/2015 Pathologic Stage    pT1b,pN0: Stage IA       11/07/2015 Oncotype testing    RS 22, which predicts a year risk of distant recurrence with tamoxifen alone 14% (intermediate-risk). Adjuvant chemo was not offered      02/21/2016 - 04/10/2016 Radiation Therapy    Adjuvant breast radiation. ([name]). Right breast: 50.4 Gy in 28 fractions.  Right breast boost: 12 Gy in 6 fractions.      05/2016 -  Anti-estrogen oral therapy    Femara 2.5 mg daily.       10/17/2016 Mammogram    MM DIAG BREAST TOMO BILATERAL 10/17/2016 FINDINGS: There are bilateral lumpectomy changes in the upper central left breast and upper central right breast. No mass, nonsurgical distortion, or suspicious microcalcification is identified in either breast to suggest malignancy. Mammographic images were processed with CAD. IMPRESSION: No evidence of malignancy in either breast. Lumpectomy changes bilaterally. RECOMMENDATION: Diagnostic mammogram is suggested in 1 year. (Code:DM-B-01Y)       [place]   03/04/2014 Initial Diagnosis    Cancer of sigmoid colon      03/09/2014 Pathology Results  G1 invasive adenocarcinoma, no lymph-Vascular invasion or Peri-neural invasion: Absent. MMR normal, MSI stable.       03/09/2014 Surgery    partial colectomy, negative margins.       11/10/2014 Relapse/Recurrence    biopsy confirmed oligo liver recurrence       11/10/2014 Imaging    PET showed two small ares of hypermetabolism in the right lobe of the liver, no other distant metastasis.       11/30/2014 Imaging    abdomen MRI showed a new enhancing lesion which corresponds to an area of abnormal hyper metabolism on recent PET-CT. Stable enlarged portal caval lymph node.       01/02/2015 Pathology Results    Liver biopsy showed  metastatic adenocarcinoma, IHC (+) for CK20, CDX-2, (-) for TTF-1 and ER      01/02/2015 Miscellaneous    liver biopsy KRAS mutation (+)       02/14/2015 Imaging    CT showed:  the small metastatic right hepatic lobe liver lesion is not identified for certain on this examination. No new lesions. Stable small cysts in segment 4 a.       03/05/2015 - 04/16/2015 Chemotherapy    CAPEOX (capecitabine 2500 mg twice daily Day 1-14, oxaliplatin 1 30 mg/m on day 1, every 21 days, S/P 3 cycles       08/24/2015 Procedure    CT-guided oligo liver metastasis microwave ablation by Dr. [name]      06/17/2016 Imaging    CT of the chest/abd/pelvis with contrast 1. Several new small pulmonary nodules worrisome for metastatic disease. 2. New large area of probable infiltrating recurrent tumor in the right hepatic lobe surrounding the prior ablation site. 3. Small supraclavicular lymph nodes.  Attention on future studies. 4. Stable surgical changes involving both breasts. No breast masses are identified. 5. Stable bilateral adrenal gland nodules. 6. Stable upper abdominal and right-sided retroperitoneal lymph nodes.      07/08/2016 PET scan    1. Large mass in the right lobe of the liver demonstrates diffuse hypermetabolism, compatible with recurrent metastatic disease in the liver 2. Multiple small pulmonary nodules are very similar to the recent Chest CT 06/17/16.  3. No other findings of metastatic disease elsewhere in the neck, chest, abdomen, or pelvis. 4. There is some low-level hypermetabolic activity adjacent to the surgical clips in the lateral aspect of the right breast at the site of prior lumpectomy.       07/29/2016 Pathology Results    Liver biopsy confirmed metastatic colon cancer       07/31/2016 -  Chemotherapy    FOLFIRI every 2 weeks, Avastin added from cycle 2       10/23/2016 Imaging    CT ABDOMEN PELVIS W CONTRAST 10/24/16 IMPRESSION: 1. The numerous tiny bilateral pulmonary  nodules seen on the previous imaging exams are no longer evident, suggesting response of therapy. 2. Large ill-defined irregular right hepatic lesion measures smaller on today's study. 3. Slight increase in size of hepatoduodenal ligament lymphadenopathy. 4. Abdominal Aortic Atherosclerois (ICD10-170.0)        CURRENT TREATMENT:   1. Letrozole 2.5 mg once daily, started in September 2017 2. Chemotherapy FOLFIRI and Avastin (from cycle 2) every 2 weeks, started on 07/31/2016, multiple delay per pt's request   INTERIM HISTORY: Veronica BRONAUGH 61 y.o. female with a history of recurrent left breast cancer, colon cancer s/p laparoscopic-assisted partial colectomy (03/09/2014), PE (01/06/2014), and right breast cancer (01/2015),  presents to clinic for  follow up.   She returns for follow up. Patient does not feel well. She has been experiencing nose bleeds that have been happening often. She expericences back pain 4/10 on a pain scale and a sharp pain in her chest wall that happens ever so often that occurs for half an hour. She has to force herself to eat. She does not have diarrhea until after chemo that goes away. Denies cough or fever but experiences sweats     MEDICAL HISTORY: Past Medical History:  Diagnosis Date  . Anxiety   . Breast CA ([place])    radiation and surgery-lt.  . Cancer ([place])    left breast cancer x2  . Colon cancer ([place])    Dr. Burr Medico seeing monthly  . Hypertension   . Liver lesion   . Pulmonary embolism ([place])    "blood clot in lungs" -dx. 4'15 with CT Chest. On Xarelto  . Radiation 01/22/10-03/12/10   left whole breast 4500 cGy, upper aspect boosted to 6120 cGy  . Radiation 02/21/16 - 04/10/16   right breast 50.4 Gy, right breast boost 12 Gy  . Shortness of breath    sob with exertion. dx. with Pulmonary emboli 4'15 ,tx. Xarelto,Lovenox used for [employer].    ALLERGIES:  is allergic to tramadol.  MEDICATIONS:  Allergies as of 12/18/2016      Reactions   Tramadol  Other (See Comments)   Dizziness      Medication List       Accurate as of 12/18/16 11:17 PM. Always use your most recent med list.          acetaminophen-codeine 300-60 MG tablet Commonly known as:  TYLENOL #4 Take 1 tablet by mouth every 8 (eight) hours as needed. for pain   amLODipine 2.5 MG tablet Commonly known as:  NORVASC Take 2.5 mg by mouth daily.   ferrous sulfate 325 (65 FE) MG tablet Take 325 mg by mouth daily with breakfast. Pt takes  4 times per week.   hydrochlorothiazide 25 MG tablet Commonly known as:  HYDRODIURIL Take 1 tablet (25 mg total) by mouth every morning.   HYDROcodone-acetaminophen 5-325 MG tablet Commonly known as:  NORCO Take 1 tablet by mouth every 6 (six) hours as needed for moderate pain.   letrozole 2.5 MG tablet Commonly known as:  FEMARA Take 1 tablet (2.5 mg total) by mouth daily.   lidocaine-prilocaine cream Commonly known as:  EMLA Apply 1 application topically as needed.   magic mouthwash w/lidocaine Soln Take 5 mLs by mouth 4 (four) times daily as needed for mouth pain.   metoprolol 50 MG tablet Commonly known as:  LOPRESSOR Take 1 tablet (50 mg total) by mouth 2 (two) times daily.   ondansetron 8 MG tablet Commonly known as:  ZOFRAN Take 1 tablet (8 mg total) by mouth 2 (two) times daily as needed for refractory nausea / vomiting. Start on day 3 after chemotherapy.   potassium chloride SA 20 MEQ tablet Commonly known as:  K-DUR,KLOR-CON Take 1 tablet (20 mEq total) by mouth daily.   prochlorperazine 10 MG tablet Commonly known as:  COMPAZINE Take 1 tablet (10 mg total) by mouth every 6 (six) hours as needed (NAUSEA).   XARELTO 20 MG Tabs tablet Generic drug:  rivaroxaban Take 1 tablet by mouth daily with supper   zolpidem 5 MG tablet Commonly known as:  AMBIEN Take 1 tablet (5 mg total) by mouth at bedtime as needed for sleep.  SURGICAL HISTORY:  Past Surgical History:  Procedure Laterality Date  .  BREAST LUMPECTOMY WITH RADIOACTIVE SEED AND SENTINEL LYMPH NODE BIOPSY Right 11/07/2015   Procedure: BREAST LUMPECTOMY WITH RADIOACTIVE SEED AND SENTINEL LYMPH NODE BIOPSY;  Surgeon: Stark Klein, MD;  Location: [facility];  Service: General;  Laterality: Right;  . BREAST SURGERY     '11- Left breast lumpectomy  . CESAREAN SECTION    . CHOLECYSTECTOMY     Laparoscopic 20 yrs ago  . COLON SURGERY  03-09-14   partial colectomy for cancer  . IR GENERIC HISTORICAL  07/29/2016   IR FLUORO GUIDE PORT INSERTION RIGHT 07/29/2016 Sandi Mariscal, MD [facility]  . IR GENERIC HISTORICAL  07/29/2016   IR US GUIDE VASC ACCESS RIGHT 07/29/2016 Sandi Mariscal, MD [facility]  . IR GENERIC HISTORICAL  07/29/2016   IR US GUIDE BX ASP/DRAIN 07/29/2016 [facility]  . PARTIAL COLECTOMY N/A 03/09/2014   Procedure: LAPAROSCOPIC ASSISTED PARTIAL COLECTOMY, splenic flexure mobilization;  Surgeon: Leighton Ruff, MD;  Location: [facility];  Service: General;  Laterality: N/A;  . RADIOFREQUENCY ABLATION Right 08/24/2015   Procedure: MICROWAVE ABLATION RIGHT LIVER LOBE ;  Surgeon: Corrie Mckusick, DO;  Location: [facility];  Service: Anesthesiology;  Laterality: Right;    REVIEW OF SYSTEMS:  Constitutional: Denies fevers, chills or abnormal weight loss (+) fatigue, sweat, decreased appetite Eyes: Denies blurriness of vision Ears, nose, mouth, throat, and face: Denies mucositis or sore throat Respiratory: Denies cough, dyspnea or wheezes Cardiovascular: Denies palpitation, chest discomfort or lower extremity swelling Gastrointestinal:  Denies heartburn or change in bowel habits. (+) Nausea Skin: Denies abnormal skin rashes Musculoskeletal: (+) intermittent right-sided flank pain. Lymphatics: Denies new lymphadenopathy or easy bruising Neurological:Denies numbness, tingling or new weaknesses Musculoskeletal: (+) back and chestpain Behavioral/Psych: Mood is stable, no new changes  All other systems were reviewed  with the patient and are negative.    PHYSICAL EXAMINATION:  ECOG PERFORMANCE STATUS: 2  Blood pressure (!) 156/96, pulse 76, temperature 98.8 F (37.1 C), temperature source Oral, resp. rate 18, height '5\' 6"'  (1.676 m), weight 272 lb 9.6 oz (123.7 kg), last menstrual period 01/23/2014, SpO2 100 %.   GENERAL:alert, no distress and comfortable; well developed and obese. Easily mobile to exam table.  SKIN: skin color, texture, turgor are normal, no rashes or significant lesions EYES: normal, Conjunctiva are pink and non-injected, sclera clear OROPHARYNX:no exudate, no erythema and lips, buccal mucosa, and tongue normal  NECK: supple, thyroid normal size, non-tender, without nodularity LYMPH:  no palpable lymphadenopathy in the cervical, axillary or supraclavicular LUNGS: clear to auscultation with normal breathing effort, no wheezes or rhonchi HEART: regular rate & rhythm and no murmurs and no lower extremity edema ABDOMEN:abdomen soft, non-tender and normal bowel sounds; incision healing with signs of infection.  Mild tenderness at the  Right flank area, no abdominal tenderness. No local megaly Musculoskeletal:no cyanosis of digits and no clubbing NEURO: alert & oriented x 3 with fluent speech, no focal motor/sensory deficits   Labs:  CBC Latest Ref Rng & Units 12/18/2016 11/27/2016 11/20/2016  WBC 3.9 - 10.3 10e3/uL 6.7 7.3 7.0  Hemoglobin 11.6 - 15.9 g/dL 12.8 12.6 12.9  Hematocrit 34.8 - 46.6 % 38.7 37.6 38.4  Platelets 145 - 400 10e3/uL 308 293 245    CMP Latest Ref Rng & Units 12/18/2016 11/27/2016 11/20/2016  Glucose 70 - 140 mg/dl 103 107 100  BUN 7.0 - 26.0 mg/dL 7.2 8.4 10.6  Creatinine 0.6 - 1.1  mg/dL 0.8 0.8 0.7  Sodium 136 - 145 mEq/L 140 141 141  Potassium 3.5 - 5.1 mEq/L 3.4(L) 3.6 3.2(L)  Chloride 101 - 111 mmol/L - - -  CO2 22 - 29 mEq/L '26 27 27  ' Calcium 8.4 - 10.4 mg/dL 10.0 9.7 9.6  Total Protein 6.4 - 8.3 g/dL 7.7 7.5 7.3  Total Bilirubin 0.20 - 1.20 mg/dL 0.41 0.45  0.44  Alkaline Phos 40 - 150 U/L 43 41 36(L)  AST 5 - 34 U/L 52(H) 46(H) 36(H)  ALT 0 - 55 U/L 30 35 27   CEA  01/18/2014: 0.8 03/01/2015: 1.3 01/31/2016: 4.8 05/28/2016: 48 07/31/2016: 173.7 08/28/16: 139 09/25/2016: 43 11/04/16: 9.94  PATHOLOGY RESULT Diagnosis 01/02/2015 Liver, needle/core biopsy, right - POSITIVE FOR METASTATIC ADENOCARCINOMA. - SEE COMMENT.   Diagnosis 11/07/2015 1. Breast, lumpectomy, Right - INVASIVE DUCTAL CARCINOMA, GRADE 2, SPANNING 1 CM. - DUCTAL CARCINOMA IN SITU, LOW GRADE. - RESECTION MARGINS ARE NEGATIVE FOR INVASIVE CARCINOMA. - DUCTAL CARCINOMA IN SITU COMES TO WITHIN 0.3 CM OF THE POSTERIOR MARGIN. - BIOPSY SITE. - SEE ONCOLOGY TABLE. 2. Lymph node, sentinel, biopsy, right axillary #1 - ONE OF ONE LYMPH NODES NEGATIVE FOR CARCINOMA (0/1). 3. Lymph node, sentinel, biopsy, right axillary #2 - ONE OF ONE LYMPH NODES NEGATIVE FOR CARCINOMA (0/1). 4. Lymph node, sentinel, biopsy, right axillary #3 - ONE OF ONE LYMPH NODES NEGATIVE FOR CARCINOMA (0/1).  ONCOTYPE DX: RS 82, which predicts a year risk of distant recurrence with tamoxifen alone 14%   Diagnosis 07/29/2016 Liver, needle/core biopsy, posterior of right lobe METASTATIC ADENOCARCINOMA, CONSISTENT WITH COLONIC PRIMARY.  RADIOLOGY STUDIES   PET 07/08/2016 IMPRESSION: 1. Large mass in the right lobe of the liver demonstrates diffuse hypermetabolism, compatible with recurrent metastatic disease in the liver. 2. Multiple small pulmonary nodules are very similar to the recent chest CT 06/17/2016. These do not demonstrate hypermetabolism on the PET images, however, these lesions are well below the level of PET imaging. Given that these nodules are new compared to prior chest CT 12/18/2015, they are concerning for potential metastatic disease to the lungs, and continued attention on followup studies is recommended. 3. No other findings of metastatic disease elsewhere in the neck, chest,  abdomen or pelvis. 4. There is some low-level hypermetabolic activity adjacent to the surgical clips in the lateral aspect of the right breast at the site of prior lumpectomy. This area has undergone interval involution over the several prior examinations, and this low-level activity is favored to be related to normal healing. Attention on follow-up imaging is recommended to ensure the stability or resolution of this finding. 5. Aortic atherosclerosis, in addition to 2 vessel coronary artery disease. Please note that although the presence of coronary artery calcium documents the presence of coronary artery disease, the severity of this disease and any potential stenosis cannot be assessed on this non-gated CT examination. Assessment for potential risk factor modification, dietary therapy or pharmacologic therapy may be warranted, if clinically indicated. 6. Additional incidental findings, as above.  MM DIAG BREAST TOMO BILATERAL 10/17/2016 FINDINGS: There are bilateral lumpectomy changes in the upper central left breast and upper central right breast. No mass, nonsurgical distortion, or suspicious microcalcification is identified in either breast to suggest malignancy. Mammographic images were processed with CAD. IMPRESSION: No evidence of malignancy in either breast. Lumpectomy changes bilaterally. RECOMMENDATION: Diagnostic mammogram is suggested in 1 year. (Code:DM-B-01Y)  CT CHEST, ABDOMEN PELVIS W CONTRAST 10/24/16 IMPRESSION: 1. The numerous tiny bilateral pulmonary nodules seen on  the previous imaging exams are no longer evident, suggesting response of therapy. 2. Large ill-defined irregular right hepatic lesion measures smaller on today's study. 3. Slight increase in size of hepatoduodenal ligament lymphadenopathy. 4. Abdominal Aortic Atherosclerois (ICD10-170.0)   ASSESSMENT: Veronica Reyes 61 y.o. female   1. Colon Cancer, Stage I (pT2N0), MMR normal, oligo recurrent  disease in liver in 12/2014, KRAS mutation (+), liver and lung recurrence in 06/2016 -I previously reviewed the nature history of metastatic colon cancer, and the potential curative approach with chemotherapy followed by liver lesion ablation or resection. However, more likely, this is not curable disease. -She initially received 3 cycles of [place]ox, does not want to proceed with fourth cycle due to the moderate side effects from previous chemotherapy treatment. She declined liver mass resection, and underwent liver ablation by interventional radiologist Dr. Earleen Newport in 08/2015. - I previously reviewed her restaging CT scan from 06/17/2016, which unfortunately showed new area of infiltrative disease as the previous ablated liver lesion, likely recurrence. She also developed several new pulmonary nodules, small, concerning for metastatic disease. 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 --She has started chemotherapy FOLFIRI and avastin, has been tolerating well, we'll continue every 2 weeks. -Her CEA has significantly decreased since she started chemotherapy, likely responding to her good response to chemotherapy. -Repeat CT scan on 10/23/16 showed resolution of numerous bilateral pulmonary nodules, the right hepatic lesion is smaller, and slight decrease in size of hepatoduodenal ligament lymphadenopathy. No other new lesions  --She was tolerating chemotherapy well overall until recently, I previously encouraged her to continue chemotherapy for at least 3 more months. -Patient wants to be off chemo if possible, and she is interested in liver targeted therapy if it's possible down the road. - Patient again requested chemo be postponed a week, she has not recovered well from last cycle chemo.  -due to her worsening fatigue and right side frank pain, I will repeat staging CT in 2 weeks   2. Right breast cancer, pT1bN0M0, stage Ia, ER+/PR+/HER2-,  History of left Breast Cancer, diagnosed on 02/01/2015 --Left CIS in situ, s/p lumpectomy in 2006 and 2011 by Dr. Margot Chimes, and radiation in 2011 by Dr. Sondra Come.  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 Surgical margins were negative. -I previously reviewed her Oncotype DX result. Her recurrence score is 22, which predicts 14% 10 year risk of distant recurrence with tamoxifen alone. This is intermediate risk, based on her relatively low number, I do not recommend adjuvant chemotherapy. -She has completed adjuvant breast radiation -her [place] level supports she is postmenopausal -She has started adjuvant letrozole, tolerating well, we'll continue. -We previously discussed breast cancer surveillance, I strongly encouraged her to continue annual screening mammogram, self exam, and follow-up with [place] routinely. -her recent mammogram was normal 10/2016  3. History of PE (01/06/2014) --She has a diagnosis of pulmonary embolism, likely secondary to malignancy in the setting of family history for stroke (and possible stroke history in patient as well). Lower extremity dopplers negative.  -continue Xarelto. Due to her metastatic colon cancer, I previously recommended her to continue indefinitely.  4.  Uterine Fibroids  --Negative endometrial biopsy. Following with Gynecology.  5. Kidney lesion (Right). -No hypermetabolic right kidney lesion on the pet scan -Repeat abdominal MRI 05/29/2015 showed unchanged right renal lesion, favored to represent a complex/septated cyst.  6. Right flank pain -  possible related to her liver metastasis -improved with chemo, seems to be worse lately  -She'll continue Tylenol No. 4 as needed - I will refill Tylenol #4 for 60 pills   7.  Morbid obesity - I have previously encouraged her to eat healthy and exercise more. - I have previously encouraged her to participate in the " living  well"  Program in our cancer center,  which is designed for breast cancer survivor to improve your diet and exercise.  8. HTN  -The patient's blood pressure is slightly elevated today. Could be related to the Avastin. -She does not have a monitor at home. I have advised her to monitor at home. She will continue following up with her PCP  9. Nose bleeds - I have discussed the patient her nosebleeds could be related to Avastin  -she knows to avoid blowing noses   Plan  - Refill Tylenol#4 for 60 pills -Lab, flush and chemo FOLFIRI and avastin in 1 and 3 weeks -f/u in 3 weeks  -restaging CT CAP with contrast in 2 weeks    All questions were answered. The patient knows to call the clinic with any problems, questions or concerns. We can certainly see the patient much sooner if necessary.  I spent 20 minutes counseling the patient face to face. The total time spent in the appointment was 25 minutes.  This document serves as a record of services personally performed by [name], MD. It was created on her behalf by Brandt Loosen, a trained medical scribe. The creation of this record is based on the scribe's personal observations and the provider's statements to them. This document has been checked and approved by the attending provider.  I have reviewed the above documentation for accuracy and completeness, and I agree with the above information.      [name]  12/18/2016

## 2016-12-18 ENCOUNTER — Ambulatory Visit (HOSPITAL_BASED_OUTPATIENT_CLINIC_OR_DEPARTMENT_OTHER): Payer: BLUE CROSS/BLUE SHIELD | Admitting: Hematology

## 2016-12-18 ENCOUNTER — Ambulatory Visit: Payer: BLUE CROSS/BLUE SHIELD

## 2016-12-18 ENCOUNTER — Telehealth: Payer: Self-pay | Admitting: Hematology

## 2016-12-18 ENCOUNTER — Ambulatory Visit (HOSPITAL_BASED_OUTPATIENT_CLINIC_OR_DEPARTMENT_OTHER): Payer: BLUE CROSS/BLUE SHIELD

## 2016-12-18 ENCOUNTER — Encounter: Payer: Self-pay | Admitting: Hematology

## 2016-12-18 ENCOUNTER — Other Ambulatory Visit (HOSPITAL_BASED_OUTPATIENT_CLINIC_OR_DEPARTMENT_OTHER): Payer: BLUE CROSS/BLUE SHIELD

## 2016-12-18 VITALS — BP 156/96 | HR 76 | Temp 98.8°F | Resp 18 | Ht 66.0 in | Wt 272.6 lb

## 2016-12-18 DIAGNOSIS — C50511 Malignant neoplasm of lower-outer quadrant of right female breast: Secondary | ICD-10-CM

## 2016-12-18 DIAGNOSIS — Z86718 Personal history of other venous thrombosis and embolism: Secondary | ICD-10-CM

## 2016-12-18 DIAGNOSIS — Z79899 Other long term (current) drug therapy: Secondary | ICD-10-CM | POA: Diagnosis not present

## 2016-12-18 DIAGNOSIS — C787 Secondary malignant neoplasm of liver and intrahepatic bile duct: Secondary | ICD-10-CM

## 2016-12-18 DIAGNOSIS — C7800 Secondary malignant neoplasm of unspecified lung: Secondary | ICD-10-CM

## 2016-12-18 DIAGNOSIS — C187 Malignant neoplasm of sigmoid colon: Secondary | ICD-10-CM

## 2016-12-18 DIAGNOSIS — Z170 Estrogen receptor positive status [ER+]: Secondary | ICD-10-CM

## 2016-12-18 DIAGNOSIS — R432 Parageusia: Secondary | ICD-10-CM

## 2016-12-18 DIAGNOSIS — I10 Essential (primary) hypertension: Secondary | ICD-10-CM | POA: Diagnosis not present

## 2016-12-18 DIAGNOSIS — Z95828 Presence of other vascular implants and grafts: Secondary | ICD-10-CM

## 2016-12-18 LAB — CBC WITH DIFFERENTIAL/PLATELET
BASO%: 0.3 % (ref 0.0–2.0)
BASOS ABS: 0 10*3/uL (ref 0.0–0.1)
EOS%: 0.9 % (ref 0.0–7.0)
Eosinophils Absolute: 0.1 10*3/uL (ref 0.0–0.5)
HEMATOCRIT: 38.7 % (ref 34.8–46.6)
HGB: 12.8 g/dL (ref 11.6–15.9)
LYMPH#: 2.4 10*3/uL (ref 0.9–3.3)
LYMPH%: 35.9 % (ref 14.0–49.7)
MCH: 32.3 pg (ref 25.1–34.0)
MCHC: 33.1 g/dL (ref 31.5–36.0)
MCV: 97.7 fL (ref 79.5–101.0)
MONO#: 0.8 10*3/uL (ref 0.1–0.9)
MONO%: 11.1 % (ref 0.0–14.0)
NEUT#: 3.5 10*3/uL (ref 1.5–6.5)
NEUT%: 51.8 % (ref 38.4–76.8)
Platelets: 308 10*3/uL (ref 145–400)
RBC: 3.96 10*6/uL (ref 3.70–5.45)
RDW: 15.1 % — ABNORMAL HIGH (ref 11.2–14.5)
WBC: 6.7 10*3/uL (ref 3.9–10.3)

## 2016-12-18 LAB — COMPREHENSIVE METABOLIC PANEL
ALK PHOS: 43 U/L (ref 40–150)
ALT: 30 U/L (ref 0–55)
ANION GAP: 10 meq/L (ref 3–11)
AST: 52 U/L — ABNORMAL HIGH (ref 5–34)
Albumin: 3.2 g/dL — ABNORMAL LOW (ref 3.5–5.0)
BILIRUBIN TOTAL: 0.41 mg/dL (ref 0.20–1.20)
BUN: 7.2 mg/dL (ref 7.0–26.0)
CALCIUM: 10 mg/dL (ref 8.4–10.4)
CHLORIDE: 104 meq/L (ref 98–109)
CO2: 26 mEq/L (ref 22–29)
CREATININE: 0.8 mg/dL (ref 0.6–1.1)
EGFR: >90 mL/min/{1.73_m2} (ref >90)
Glucose: 103 mg/dl (ref 70–140)
Potassium: 3.4 mEq/L — ABNORMAL LOW (ref 3.5–5.1)
Sodium: 140 mEq/L (ref 136–145)
TOTAL PROTEIN: 7.7 g/dL (ref 6.4–8.3)

## 2016-12-18 LAB — UA PROTEIN, DIPSTICK - CHCC: PROTEIN: NEGATIVE mg/dL

## 2016-12-18 MED ORDER — SODIUM CHLORIDE 0.9% FLUSH
10.0000 mL | INTRAVENOUS | Status: DC | PRN
  Administered 2016-12-18: 10 mL via INTRAVENOUS
  Filled 2016-12-18: qty 10

## 2016-12-18 MED ORDER — HEPARIN SOD (PORK) LOCK FLUSH 100 UNIT/ML IV SOLN
500.0000 [IU] | Freq: Once | INTRAVENOUS | Status: AC | PRN
  Administered 2016-12-18: 500 [IU] via INTRAVENOUS
  Filled 2016-12-18: qty 5

## 2016-12-18 MED ORDER — ACETAMINOPHEN-CODEINE #4 300-60 MG PO TABS: 1.0000 | ORAL_TABLET | Freq: Three times a day (TID) | ORAL | 0 refills | Status: DC | PRN

## 2016-12-18 NOTE — Addendum Note (Signed)
Addended by: Jethro Bolus A on: 12/18/2016 10:26 AM   Modules accepted: Orders

## 2016-12-18 NOTE — Progress Notes (Signed)
Met with patient who had a need with getting prescription filled. Patient sent over by physician. Patient verbally gave her income and household size. Approved patient for one-time $400 grant. Patient has a copy of the approval letter as well as the expense sheet. Recommended patient reserve funds for medication and transportation. Patient received a gas card today from her grant as well. Called [facility] to have them transfer rx so that she may use the grant there. Patient has my card for any additional financial questions or concerns.

## 2016-12-18 NOTE — Telephone Encounter (Signed)
Gave patient AVS and calender per 12/18/2016 los . Central radiology to contact patient for Ct when order is in . Per MD (verbal) Ct will be done at [facility].

## 2016-12-18 NOTE — Progress Notes (Signed)
Patient returned to flush room requesting port to be flushed and removed. Treatment was cancelled. Pt port deaccessed per protocol. Pt discharged from flush room to scheduling.

## 2016-12-19 LAB — CEA (IN HOUSE-CHCC): CEA (CHCC-IN HOUSE): 6.99 ng/mL — AB (ref 0.00–5.00)

## 2016-12-25 ENCOUNTER — Ambulatory Visit (HOSPITAL_BASED_OUTPATIENT_CLINIC_OR_DEPARTMENT_OTHER): Payer: BLUE CROSS/BLUE SHIELD

## 2016-12-25 ENCOUNTER — Ambulatory Visit: Payer: BLUE CROSS/BLUE SHIELD

## 2016-12-25 ENCOUNTER — Other Ambulatory Visit (HOSPITAL_BASED_OUTPATIENT_CLINIC_OR_DEPARTMENT_OTHER): Payer: BLUE CROSS/BLUE SHIELD

## 2016-12-25 ENCOUNTER — Other Ambulatory Visit: Payer: Self-pay | Admitting: Hematology

## 2016-12-25 VITALS — BP 133/82 | HR 66 | Temp 98.6°F | Resp 18

## 2016-12-25 DIAGNOSIS — Z95828 Presence of other vascular implants and grafts: Secondary | ICD-10-CM

## 2016-12-25 DIAGNOSIS — Z5111 Encounter for antineoplastic chemotherapy: Secondary | ICD-10-CM

## 2016-12-25 DIAGNOSIS — C787 Secondary malignant neoplasm of liver and intrahepatic bile duct: Secondary | ICD-10-CM

## 2016-12-25 DIAGNOSIS — C187 Malignant neoplasm of sigmoid colon: Secondary | ICD-10-CM

## 2016-12-25 LAB — CBC WITH DIFFERENTIAL/PLATELET
BASO%: 0.2 % (ref 0.0–2.0)
Basophils Absolute: 0 10*3/uL (ref 0.0–0.1)
EOS%: 0.7 % (ref 0.0–7.0)
Eosinophils Absolute: 0.1 10*3/uL (ref 0.0–0.5)
HEMATOCRIT: 40.5 % (ref 34.8–46.6)
HEMOGLOBIN: 13.3 g/dL (ref 11.6–15.9)
LYMPH#: 2.7 10*3/uL (ref 0.9–3.3)
LYMPH%: 31.7 % (ref 14.0–49.7)
MCH: 31.8 pg (ref 25.1–34.0)
MCHC: 32.8 g/dL (ref 31.5–36.0)
MCV: 96.9 fL (ref 79.5–101.0)
MONO#: 0.5 10*3/uL (ref 0.1–0.9)
MONO%: 6.1 % (ref 0.0–14.0)
NEUT#: 5.3 10*3/uL (ref 1.5–6.5)
NEUT%: 61.3 % (ref 38.4–76.8)
Platelets: 272 10*3/uL (ref 145–400)
RBC: 4.18 10*6/uL (ref 3.70–5.45)
RDW: 14.8 % — ABNORMAL HIGH (ref 11.2–14.5)
WBC: 8.6 10*3/uL (ref 3.9–10.3)

## 2016-12-25 LAB — COMPREHENSIVE METABOLIC PANEL
ALBUMIN: 3.4 g/dL — AB (ref 3.5–5.0)
ALK PHOS: 44 U/L (ref 40–150)
ALT: 27 U/L (ref 0–55)
AST: 49 U/L — AB (ref 5–34)
Anion Gap: 11 mEq/L (ref 3–11)
BILIRUBIN TOTAL: 0.57 mg/dL (ref 0.20–1.20)
BUN: 10.5 mg/dL (ref 7.0–26.0)
CALCIUM: 10.1 mg/dL (ref 8.4–10.4)
CO2: 24 mEq/L (ref 22–29)
CREATININE: 0.7 mg/dL (ref 0.6–1.1)
Chloride: 105 mEq/L (ref 98–109)
EGFR: >90 mL/min/{1.73_m2} (ref >90)
Glucose: 104 mg/dl (ref 70–140)
Potassium: 3.8 mEq/L (ref 3.5–5.1)
Sodium: 139 mEq/L (ref 136–145)
Total Protein: 8.1 g/dL (ref 6.4–8.3)

## 2016-12-25 MED ORDER — IRINOTECAN HCL CHEMO INJECTION 100 MG/5ML
160.0000 mg/m2 | Freq: Once | INTRAVENOUS | Status: AC
  Administered 2016-12-25: 400 mg via INTRAVENOUS
  Filled 2016-12-25: qty 15

## 2016-12-25 MED ORDER — ATROPINE SULFATE 1 MG/ML IJ SOLN
INTRAMUSCULAR | Status: AC
  Filled 2016-12-25: qty 1

## 2016-12-25 MED ORDER — DEXAMETHASONE SODIUM PHOSPHATE 10 MG/ML IJ SOLN
10.0000 mg | Freq: Once | INTRAMUSCULAR | Status: AC
  Administered 2016-12-25: 10 mg via INTRAVENOUS

## 2016-12-25 MED ORDER — PALONOSETRON HCL INJECTION 0.25 MG/5ML
INTRAVENOUS | Status: AC
  Filled 2016-12-25: qty 5

## 2016-12-25 MED ORDER — DEXAMETHASONE SODIUM PHOSPHATE 10 MG/ML IJ SOLN
INTRAMUSCULAR | Status: AC
  Filled 2016-12-25: qty 1

## 2016-12-25 MED ORDER — PALONOSETRON HCL INJECTION 0.25 MG/5ML
0.2500 mg | Freq: Once | INTRAVENOUS | Status: AC
  Administered 2016-12-25: 0.25 mg via INTRAVENOUS

## 2016-12-25 MED ORDER — SODIUM CHLORIDE 0.9 % IV SOLN
Freq: Once | INTRAVENOUS | Status: AC
  Administered 2016-12-25: 13:00:00 via INTRAVENOUS

## 2016-12-25 MED ORDER — SODIUM CHLORIDE 0.9 % IV SOLN
2400.0000 mg/m2 | INTRAVENOUS | Status: DC
  Administered 2016-12-25: 5850 mg via INTRAVENOUS
  Filled 2016-12-25: qty 117

## 2016-12-25 MED ORDER — SODIUM CHLORIDE 0.9% FLUSH
10.0000 mL | INTRAVENOUS | Status: DC | PRN
  Administered 2016-12-25: 10 mL via INTRAVENOUS
  Filled 2016-12-25: qty 10

## 2016-12-25 MED ORDER — DEXTROSE 5 % IV SOLN
400.0000 mg/m2 | Freq: Once | INTRAVENOUS | Status: AC
  Administered 2016-12-25: 976 mg via INTRAVENOUS
  Filled 2016-12-25: qty 48.8

## 2016-12-25 MED ORDER — ATROPINE SULFATE 1 MG/ML IJ SOLN
0.5000 mg | Freq: Once | INTRAMUSCULAR | Status: AC | PRN
  Administered 2016-12-25: 0.5 mg via INTRAVENOUS

## 2016-12-25 NOTE — Progress Notes (Signed)
Patient reports a nosebleed this morning stating the bleeding stopped quickly however was a moderate amount. Dr. Burr Medico notified. Hold avastin today proceed with FOLFIIRI. Patient verbalized understanding.

## 2016-12-25 NOTE — Patient Instructions (Signed)
Implanted Port Home Guide An implanted port is a type of central line that is placed under the skin. Central lines are used to provide IV access when treatment or nutrition needs to be given through a person's veins. Implanted ports are used for long-term IV access. An implanted port may be placed because:  You need IV medicine that would be irritating to the small veins in your hands or arms.  You need long-term IV medicines, such as antibiotics.  You need IV nutrition for a long period.  You need frequent blood draws for lab tests.  You need dialysis.  Implanted ports are usually placed in the chest area, but they can also be placed in the upper arm, the abdomen, or the leg. An implanted port has two main parts:  Reservoir. The reservoir is round and will appear as a small, raised area under your skin. The reservoir is the part where a needle is inserted to give medicines or draw blood.  Catheter. The catheter is a thin, flexible tube that extends from the reservoir. The catheter is placed into a large vein. Medicine that is inserted into the reservoir goes into the catheter and then into the vein.  How will I care for my incision site? Do not get the incision site wet. Bathe or shower as directed by your health care provider. How is my port accessed? Special steps must be taken to access the port:  Before the port is accessed, a numbing cream can be placed on the skin. This helps numb the skin over the port site.  Your health care provider uses a sterile technique to access the port. ? Your health care provider must put on a mask and sterile gloves. ? The skin over your port is cleaned carefully with an antiseptic and allowed to dry. ? The port is gently pinched between sterile gloves, and a needle is inserted into the port.  Only "non-coring" port needles should be used to access the port. Once the port is accessed, a blood return should be checked. This helps ensure that the port  is in the vein and is not clogged.  If your port needs to remain accessed for a constant infusion, a clear (transparent) bandage will be placed over the needle site. The bandage and needle will need to be changed every week, or as directed by your health care provider.  Keep the bandage covering the needle clean and dry. Do not get it wet. Follow your health care provider's instructions on how to take a shower or bath while the port is accessed.  If your port does not need to stay accessed, no bandage is needed over the port.  What is flushing? Flushing helps keep the port from getting clogged. Follow your health care provider's instructions on how and when to flush the port. Ports are usually flushed with saline solution or a medicine called heparin. The need for flushing will depend on how the port is used.  If the port is used for intermittent medicines or blood draws, the port will need to be flushed: ? After medicines have been given. ? After blood has been drawn. ? As part of routine maintenance.  If a constant infusion is running, the port may not need to be flushed.  How long will my port stay implanted? The port can stay in for as long as your health care provider thinks it is needed. When it is time for the port to come out, surgery will be   done to remove it. The procedure is similar to the one performed when the port was put in. When should I seek immediate medical care? When you have an implanted port, you should seek immediate medical care if:  You notice a bad smell coming from the incision site.  You have swelling, redness, or drainage at the incision site.  You have more swelling or pain at the port site or the surrounding area.  You have a fever that is not controlled with medicine.  This information is not intended to replace advice given to you by your health care provider. Make sure you discuss any questions you have with your health care provider. Document  Released: 09/01/2005 Document Revised: 02/07/2016 Document Reviewed: 05/09/2013 Elsevier Interactive Patient Education  2017 [employer]  

## 2016-12-25 NOTE — Patient Instructions (Signed)
[facility] Discharge Instructions for Patients Receiving Chemotherapy  Today you received the following chemotherapy agents 5 FU/Leucovorin/Irinotecan To help prevent nausea and vomiting after your treatment, we encourage you to take your nausea medication as prescribed.   If you develop nausea and vomiting that is not controlled by your nausea medication, call the clinic.   BELOW ARE SYMPTOMS THAT SHOULD BE REPORTED IMMEDIATELY:  *FEVER GREATER THAN 100.5 F  *CHILLS WITH OR WITHOUT FEVER  NAUSEA AND VOMITING THAT IS NOT CONTROLLED WITH YOUR NAUSEA MEDICATION  *UNUSUAL SHORTNESS OF BREATH  *UNUSUAL BRUISING OR BLEEDING  TENDERNESS IN MOUTH AND THROAT WITH OR WITHOUT PRESENCE OF ULCERS  *URINARY PROBLEMS  *BOWEL PROBLEMS  UNUSUAL RASH Items with * indicate a potential emergency and should be followed up as soon as possible.  Feel free to call the clinic you have any questions or concerns. The clinic phone number is [phone].  Please show the [facility] at check-in to the Emergency Department and triage nurse.

## 2016-12-27 ENCOUNTER — Ambulatory Visit (HOSPITAL_BASED_OUTPATIENT_CLINIC_OR_DEPARTMENT_OTHER): Payer: Self-pay

## 2016-12-27 VITALS — BP 134/84 | HR 63 | Temp 99.2°F | Resp 18

## 2016-12-27 DIAGNOSIS — C187 Malignant neoplasm of sigmoid colon: Secondary | ICD-10-CM

## 2016-12-27 MED ORDER — SODIUM CHLORIDE 0.9% FLUSH
10.0000 mL | INTRAVENOUS | Status: DC | PRN
  Administered 2016-12-27: 10 mL
  Filled 2016-12-27: qty 10

## 2016-12-27 MED ORDER — HEPARIN SOD (PORK) LOCK FLUSH 100 UNIT/ML IV SOLN
500.0000 [IU] | Freq: Once | INTRAVENOUS | Status: AC | PRN
  Administered 2016-12-27: 500 [IU]
  Filled 2016-12-27: qty 5

## 2017-01-05 ENCOUNTER — Ambulatory Visit (HOSPITAL_COMMUNITY)
Admission: RE | Admit: 2017-01-05 | Discharge: 2017-01-05 | Disposition: A | Discharge status: Home / Self Care | Payer: BLUE CROSS/BLUE SHIELD | Source: Ambulatory Visit | Attending: Hematology | Admitting: Hematology

## 2017-01-05 ENCOUNTER — Encounter (HOSPITAL_COMMUNITY): Payer: Self-pay

## 2017-01-05 DIAGNOSIS — Z170 Estrogen receptor positive status [ER+]: Secondary | ICD-10-CM | POA: Insufficient documentation

## 2017-01-05 DIAGNOSIS — R911 Solitary pulmonary nodule: Secondary | ICD-10-CM | POA: Diagnosis not present

## 2017-01-05 DIAGNOSIS — C787 Secondary malignant neoplasm of liver and intrahepatic bile duct: Secondary | ICD-10-CM | POA: Insufficient documentation

## 2017-01-05 DIAGNOSIS — E278 Other specified disorders of adrenal gland: Secondary | ICD-10-CM | POA: Diagnosis not present

## 2017-01-05 DIAGNOSIS — I2510 Atherosclerotic heart disease of native coronary artery without angina pectoris: Secondary | ICD-10-CM | POA: Diagnosis not present

## 2017-01-05 DIAGNOSIS — C50511 Malignant neoplasm of lower-outer quadrant of right female breast: Secondary | ICD-10-CM | POA: Diagnosis not present

## 2017-01-05 DIAGNOSIS — I700 Atherosclerosis of aorta: Secondary | ICD-10-CM | POA: Diagnosis not present

## 2017-01-05 MED ORDER — IOPAMIDOL (ISOVUE-300) INJECTION 61%
100.0000 mL | Freq: Once | INTRAVENOUS | Status: AC | PRN
  Administered 2017-01-05: 100 mL via INTRAVENOUS

## 2017-01-05 MED ORDER — IOPAMIDOL (ISOVUE-300) INJECTION 61%
INTRAVENOUS | Status: AC
  Filled 2017-01-05: qty 100

## 2017-01-07 NOTE — Progress Notes (Signed)
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, estrogen receptor positive ([place])  Cancer of sigmoid colon ([place]) - Plan: DISCONTINUED: 0.9 %  sodium chloride infusion, DISCONTINUED: sodium chloride flush (NS) 0.9 % injection 10 mL, DISCONTINUED: atropine injection 0.5 mg, DISCONTINUED: dexamethasone (DECADRON) injection 10 mg, DISCONTINUED: palonosetron (ALOXI) injection 0.25 mg, DISCONTINUED: bevacizumab (AVASTIN) 650 mg in sodium chloride 0.9 % 100 mL chemo infusion, DISCONTINUED: irinotecan (CAMPTOSAR) 400 mg in dextrose 5 % 500 mL chemo infusion, DISCONTINUED: leucovorin 976 mg in dextrose 5 % 250 mL infusion, DISCONTINUED: fluorouracil (ADRUCIL) 5,850 mg in sodium chloride 0.9 % 133 mL chemo infusion  Essential hypertension, benign  Obesity, Class III, BMI 40-49.9 (morbid obesity) ([name])  Personal history of venous thrombosis and embolism  PROBLEM LIST 1. Left breast DCIS diagnosed in 2006 and 2010, status post lumpectomy. 2. pT2N0M0 stage I colon cancer, s/p partial colectomy on 03/09/2014  3. PE in 12/2013 when she was diagnosed with colon cancer. 4. 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 [facility])   Staging form: Breast, AJCC 7th Edition     Clinical stage from 02/01/2015: Stage IA (T1a, N0, M0) - Signed by [facility] Reyes, Veronica on 06/04/2015     Pathologic stage from 11/10/2015: Stage IA (T1b, N0, M0) - Signed by [facility] Reyes, Veronica on 12/08/2015 Cancer of sigmoid colon [facility])   Staging form: Colon and Rectum, AJCC 7th Edition     Clinical: Stage I (T2, N0, M0) - Unsigned     Breast cancer of lower-outer quadrant of right female breast ([place])   2006 Cancer Diagnosis    Left breast DCIS diagnosed in 2006 and  2010, status post lumpectomy.      01/07/2014 Initial Diagnosis    Breast cancer      01/18/2015 Mammogram    75m mass in lower outer quadrant of right breast       02/01/2015 Initial Biopsy    (R) breast mass biopsy showed invasive ductal carcinoma & DCIS, grade 1-2.       02/01/2015 Receptors her2    ER 90%+, PR 10%+, Ki67 10%, HER2 negative (ratio 1.09)       11/07/2015 Surgery    (R) breast lumpectomy and sentinel lymph node biopsy ([name]      11/07/2015 Pathology Results    (R) breast lumpectomy showed invasive ductal carcinoma, grade 2, 1 cm, low-grade DCIS, negative margins, 3 sentinel lymph nodes were negative. LVI (-). HER2 repeated and remains negative (ratio 1.29).        11/07/2015 Pathologic Stage    pT1b,pN0: Stage IA       11/07/2015 Oncotype testing    RS 22, which predicts a year risk of distant recurrence with tamoxifen alone 14% (intermediate-risk). Adjuvant chemo was not offered      02/21/2016 - 04/10/2016 Radiation Therapy    Adjuvant breast radiation. ([name]). Right breast: 50.4 Gy in 28 fractions.  Right breast boost: 12 Gy in 6 fractions.      05/2016 -  Anti-estrogen oral therapy    Femara 2.5 mg daily.       10/17/2016 Mammogram    MM DIAG BREAST TOMO BILATERAL 10/17/2016 FINDINGS: There are bilateral lumpectomy changes in the upper central left breast  and upper central right breast. No mass, nonsurgical distortion, or suspicious microcalcification is identified in either breast to suggest malignancy. Mammographic images were processed with CAD. IMPRESSION: No evidence of malignancy in either breast. Lumpectomy changes bilaterally. RECOMMENDATION: Diagnostic mammogram is suggested in 1 year. (Code:[id number]-B-01Y)       Cancer of sigmoid colon ([place])   03/04/2014 Initial Diagnosis    Cancer of sigmoid colon      03/09/2014 Pathology Results    G1 invasive adenocarcinoma, no lymph-Vascular invasion or Peri-neural invasion: Absent. MMR normal, MSI stable.        03/09/2014 Surgery    partial colectomy, negative margins.       11/10/2014 Relapse/Recurrence    biopsy confirmed oligo liver recurrence       11/10/2014 Imaging    PET showed two small ares of hypermetabolism in the right lobe of the liver, no other distant metastasis.       11/30/2014 Imaging    abdomen MRI showed a new enhancing lesion which corresponds to an area of abnormal hyper metabolism on recent PET-CT. Stable enlarged portal caval lymph node.       01/02/2015 Pathology Results    Liver biopsy showed metastatic adenocarcinoma, IHC (+) for CK20, CDX-2, (-) for TTF-1 and ER      01/02/2015 Miscellaneous    liver biopsy KRAS mutation (+)       02/14/2015 Imaging    CT showed:  the small metastatic right hepatic lobe liver lesion is not identified for certain on this examination. No new lesions. Stable small cysts in segment 4 a.       03/05/2015 - 04/16/2015 Chemotherapy    CAPEOX (capecitabine 2500 mg twice daily Day 1-14, oxaliplatin 1 30 mg/m on day 1, every 21 days, S/P 3 cycles       08/24/2015 Procedure    CT-guided oligo liver metastasis microwave ablation by Dr. [name]      06/17/2016 Imaging    CT of the chest/abd/pelvis with contrast 1. Several new small pulmonary nodules worrisome for metastatic disease. 2. New large area of probable infiltrating recurrent tumor in the right hepatic lobe surrounding the prior ablation site. 3. Small supraclavicular lymph nodes.  Attention on future studies. 4. Stable surgical changes involving both breasts. No breast masses are identified. 5. Stable bilateral adrenal gland nodules. 6. Stable upper abdominal and right-sided retroperitoneal lymph nodes.      07/08/2016 PET scan    1. Large mass in the right lobe of the liver demonstrates diffuse hypermetabolism, compatible with recurrent metastatic disease in the liver 2. Multiple small pulmonary nodules are very similar to the recent Chest CT 06/17/16.  3. No other  findings of metastatic disease elsewhere in the neck, chest, abdomen, or pelvis. 4. There is some low-level hypermetabolic activity adjacent to the surgical clips in the lateral aspect of the right breast at the site of prior lumpectomy.       07/29/2016 Pathology Results    Liver biopsy confirmed metastatic colon cancer       07/31/2016 -  Chemotherapy    FOLFIRI every 2 weeks, Avastin added from cycle 2       10/23/2016 Imaging    CT ABDOMEN PELVIS W CONTRAST 10/24/16 IMPRESSION: 1. The numerous tiny bilateral pulmonary nodules seen on the previous imaging exams are no longer evident, suggesting response of therapy. 2. Large ill-defined irregular right hepatic lesion measures smaller on today's study. 3. Slight increase in size of hepatoduodenal ligament lymphadenopathy. 4. Abdominal  Aortic Atherosclerois (ICD10-170.0)      01/05/2017 Imaging    CT CAP IMPRESSION: 1. Left lower lobe pulmonary nodule and hepatic metastatic disease are stable. 2. Hepatoduodenal ligament lymph node is stable. 3. Aortic atherosclerosis (ICD10-170.0). Coronary artery calcification. 4. Enlarged pulmonary arteries, indicative of pulmonary arterial hypertension. 5. Small bilateral adrenal nodules are unchanged.        CURRENT TREATMENT:   1. Letrozole 2.5 mg once daily, started in September 2017 2. Chemotherapy FOLFIRI and Avastin (from cycle 2) every 2 weeks, started on 07/31/2016, multiple delay per pt's request   INTERIM HISTORY: ZAYLYNN RICKETT 61 y.o. female with a history of recurrent left breast cancer, colon cancer s/p laparoscopic-assisted partial colectomy (03/09/2014), PE (01/06/2014), and right breast cancer (01/2015),  presents to clinic for follow up.   She has been tolerating chemo moderately well, Her main complaint is fatigue, anorexia and taste change. She did not have much diarrhea from her last cycle chemotherapy. She has recovered well. She still has moderate pain in the right  flank area intermittently, she takes hydrocodone twice daily. No other new complaints.   MEDICAL HISTORY: Past Medical History:  Diagnosis Date  . Anxiety   . Breast CA ([place])    radiation and surgery-lt.  . Cancer ([place])    left breast cancer x2  . Colon cancer ([place])    Dr. Burr Medico seeing monthly  . Hypertension   . Liver lesion   . Pulmonary embolism ([place])    "blood clot in lungs" -dx. 4'15 with CT Chest. On Xarelto  . Radiation 01/22/10-03/12/10   left whole breast 4500 cGy, upper aspect boosted to 6120 cGy  . Radiation 02/21/16 - 04/10/16   right breast 50.4 Gy, right breast boost 12 Gy  . Shortness of breath    sob with exertion. dx. with Pulmonary emboli 4'15 ,tx. Xarelto,Lovenox used for [employer].    ALLERGIES:  is allergic to tramadol.  MEDICATIONS:  Allergies as of 01/08/2017      Reactions   Tramadol Other (See Comments)   Dizziness      Medication List       Accurate as of 01/08/17 11:59 PM. Always use your most recent med list.          acetaminophen-codeine 300-60 MG tablet Commonly known as:  TYLENOL #4 Take 1 tablet by mouth every 8 (eight) hours as needed. for pain   amLODipine 2.5 MG tablet Commonly known as:  NORVASC Take 2.5 mg by mouth daily.   dexamethasone 4 MG tablet Commonly known as:  DECADRON Take 1 tablet (4 mg total) by mouth daily.   ferrous sulfate 325 (65 FE) MG tablet Take 325 mg by mouth daily with breakfast. Pt takes  4 times per week.   hydrochlorothiazide 25 MG tablet Commonly known as:  HYDRODIURIL Take 1 tablet (25 mg total) by mouth every morning.   HYDROcodone-acetaminophen 5-325 MG tablet Commonly known as:  NORCO Take 1 tablet by mouth every 6 (six) hours as needed for moderate pain.   letrozole 2.5 MG tablet Commonly known as:  FEMARA Take 1 tablet (2.5 mg total) by mouth daily.   lidocaine-prilocaine cream Commonly known as:  EMLA Apply 1 application topically as needed.   magic mouthwash w/lidocaine Soln Take 5  mLs by mouth 4 (four) times daily as needed for mouth pain.   metoprolol 50 MG tablet Commonly known as:  LOPRESSOR Take 1 tablet (50 mg total) by mouth 2 (two) times daily.   ondansetron  8 MG tablet Commonly known as:  ZOFRAN Take 1 tablet (8 mg total) by mouth 2 (two) times daily as needed for refractory nausea / vomiting. Start on day 3 after chemotherapy.   potassium chloride SA 20 MEQ tablet Commonly known as:  K-DUR,KLOR-CON Take 1 tablet (20 mEq total) by mouth daily.   prochlorperazine 10 MG tablet Commonly known as:  COMPAZINE Take 1 tablet (10 mg total) by mouth every 6 (six) hours as needed (NAUSEA).   XARELTO 20 MG Tabs tablet Generic drug:  rivaroxaban Take 1 tablet by mouth daily with supper   zolpidem 5 MG tablet Commonly known as:  AMBIEN Take 1 tablet (5 mg total) by mouth at bedtime as needed for sleep.       SURGICAL HISTORY:  Past Surgical History:  Procedure Laterality Date  . BREAST LUMPECTOMY WITH RADIOACTIVE SEED AND SENTINEL LYMPH NODE BIOPSY Right 11/07/2015   Procedure: BREAST LUMPECTOMY WITH RADIOACTIVE SEED AND SENTINEL LYMPH NODE BIOPSY;  Surgeon: Stark Klein, Veronica;  Location: [facility];  Service: General;  Laterality: Right;  . BREAST SURGERY     '11- Left breast lumpectomy  . CESAREAN SECTION    . CHOLECYSTECTOMY     Laparoscopic 20 yrs ago  . COLON SURGERY  03-09-14   partial colectomy for cancer  . IR GENERIC HISTORICAL  07/29/2016   IR FLUORO GUIDE PORT INSERTION RIGHT 07/29/2016 Sandi Mariscal, [facility]  . IR GENERIC HISTORICAL  07/29/2016   IR US GUIDE VASC ACCESS RIGHT 07/29/2016 Sandi Mariscal, [facility]  . IR GENERIC HISTORICAL  07/29/2016   IR US GUIDE BX ASP/DRAIN 07/29/2016 [facility]  . PARTIAL COLECTOMY N/A 03/09/2014   Procedure: LAPAROSCOPIC ASSISTED PARTIAL COLECTOMY, splenic flexure mobilization;  Surgeon: Leighton Ruff, [facility]onica;  Location: [facility];  Service: General;  Laterality: N/A;  . RADIOFREQUENCY  ABLATION Right 08/24/2015   Procedure: MICROWAVE ABLATION RIGHT LIVER LOBE ;  Surgeon: Corrie Mckusick, DO;  Location: [facility];  Service: Anesthesiology;  Laterality: Right;    REVIEW OF SYSTEMS:  Constitutional: Denies fevers, chills or abnormal weight loss (+) fatigue, sweat, decreased appetite Eyes: Denies blurriness of vision Ears, nose, mouth, throat, and face: Denies mucositis or sore throat Respiratory: Denies cough, dyspnea or wheezes Cardiovascular: Denies palpitation, chest discomfort or lower extremity swelling Gastrointestinal:  Denies heartburn or change in bowel habits. (+) Nausea Skin: Denies abnormal skin rashes Musculoskeletal: (+) intermittent right-sided flank pain. Lymphatics: Denies new lymphadenopathy or easy bruising Neurological:Denies numbness, tingling or new weaknesses Musculoskeletal: (+) back and chestpain Behavioral/Psych: Mood is stable, no new changes  All other systems were reviewed with the patient and are negative.    PHYSICAL EXAMINATION:  ECOG PERFORMANCE STATUS: 2  Blood pressure 140/76, pulse 64, temperature 98.9 F (37.2 C), temperature source Oral, resp. rate 18, height '5\' 6"'  (1.676 m), weight 268 lb 9.6 oz (121.8 kg), last menstrual period 01/23/2014, SpO2 100 %.   GENERAL:alert, no distress and comfortable; well developed and obese. Easily mobile to exam table.  SKIN: skin color, texture, turgor are normal, no rashes or significant lesions EYES: normal, Conjunctiva are pink and non-injected, sclera clear OROPHARYNX:no exudate, no erythema and lips, buccal mucosa, and tongue normal  NECK: supple, thyroid normal size, non-tender, without nodularity LYMPH:  no palpable lymphadenopathy in the cervical, axillary or supraclavicular LUNGS: clear to auscultation with normal breathing effort, no wheezes or rhonchi HEART: regular rate & rhythm and no murmurs and no lower extremity edema ABDOMEN:abdomen soft, non-tender  and normal bowel sounds; incision  healing with signs of infection.  Mild tenderness at the  Right flank area, no abdominal tenderness. No local megaly Musculoskeletal:no cyanosis of digits and no clubbing NEURO: alert & oriented x 3 with fluent speech, no focal motor/sensory deficits   Labs:  CBC Latest Ref Rng & Units 01/08/2017 12/25/2016 12/18/2016  WBC 3.9 - 10.3 10e3/uL 6.5 8.6 6.7  Hemoglobin 11.6 - 15.9 g/dL 12.7 13.3 12.8  Hematocrit 34.8 - 46.6 % 37.6 40.5 38.7  Platelets 145 - 400 10e3/uL 253 272 308    CMP Latest Ref Rng & Units 01/08/2017 12/25/2016 12/18/2016  Glucose 70 - 140 mg/dl 107 104 103  BUN 7.0 - 26.0 mg/dL 10.2 10.5 7.2  Creatinine 0.6 - 1.1 mg/dL 0.8 0.7 0.8  Sodium 136 - 145 mEq/L 142 139 140  Potassium 3.5 - 5.1 mEq/L 3.0(LL) 3.8 3.4(L)  Chloride 101 - 111 mmol/L - - -  CO2 22 - 29 mEq/L '28 24 26  ' Calcium 8.4 - 10.4 mg/dL 9.7 10.1 10.0  Total Protein 6.4 - 8.3 g/dL 7.5 8.1 7.7  Total Bilirubin 0.20 - 1.20 mg/dL 0.64 0.57 0.41  Alkaline Phos 40 - 150 U/L 38(L) 44 43  AST 5 - 34 U/L 30 49(H) 52(H)  ALT 0 - 55 U/L '24 27 30   ' CEA  01/18/2014: 0.8 03/01/2015: 1.3 01/31/2016: 4.8 05/28/2016: 48 07/31/2016: 173.7 08/28/16: 139 09/25/2016: 43 11/04/16: 9.94 12/18/2016: 6.99   PATHOLOGY RESULT Diagnosis 01/02/2015 Liver, needle/core biopsy, right - POSITIVE FOR METASTATIC ADENOCARCINOMA. - SEE COMMENT.   Diagnosis 11/07/2015 1. Breast, lumpectomy, Right - INVASIVE DUCTAL CARCINOMA, GRADE 2, SPANNING 1 CM. - DUCTAL CARCINOMA IN SITU, LOW GRADE. - RESECTION MARGINS ARE NEGATIVE FOR INVASIVE CARCINOMA. - DUCTAL CARCINOMA IN SITU COMES TO WITHIN 0.3 CM OF THE POSTERIOR MARGIN. - BIOPSY SITE. - SEE ONCOLOGY TABLE. 2. Lymph node, sentinel, biopsy, right axillary #1 - ONE OF ONE LYMPH NODES NEGATIVE FOR CARCINOMA (0/1). 3. Lymph node, sentinel, biopsy, right axillary #2 - ONE OF ONE LYMPH NODES NEGATIVE FOR CARCINOMA (0/1). 4. Lymph node, sentinel, biopsy, right axillary #3 - ONE OF ONE LYMPH NODES  NEGATIVE FOR CARCINOMA (0/1).  ONCOTYPE DX: RS 56, which predicts a year risk of distant recurrence with tamoxifen alone 14%   Diagnosis 07/29/2016 Liver, needle/core biopsy, posterior of right lobe METASTATIC ADENOCARCINOMA, CONSISTENT WITH COLONIC PRIMARY.  RADIOLOGY STUDIES   PET 07/08/2016 IMPRESSION: 1. Large mass in the right lobe of the liver demonstrates diffuse hypermetabolism, compatible with recurrent metastatic disease in the liver. 2. Multiple small pulmonary nodules are very similar to the recent chest CT 06/17/2016. These do not demonstrate hypermetabolism on the PET images, however, these lesions are well below the level of PET imaging. Given that these nodules are new compared to prior chest CT 12/18/2015, they are concerning for potential metastatic disease to the lungs, and continued attention on followup studies is recommended. 3. No other findings of metastatic disease elsewhere in the neck, chest, abdomen or pelvis. 4. There is some low-level hypermetabolic activity adjacent to the surgical clips in the lateral aspect of the right breast at the site of prior lumpectomy. This area has undergone interval involution over the several prior examinations, and this low-level activity is favored to be related to normal healing. Attention on follow-up imaging is recommended to ensure the stability or resolution of this finding. 5. Aortic atherosclerosis, in addition to 2 vessel coronary artery disease. Please note that although the presence of coronary  artery calcium documents the presence of coronary artery disease, the severity of this disease and any potential stenosis cannot be assessed on this non-gated CT examination. Assessment for potential risk factor modification, dietary therapy or pharmacologic therapy may be warranted, if clinically indicated. 6. Additional incidental findings, as above.  MM DIAG BREAST TOMO BILATERAL 10/17/2016 FINDINGS: There are  bilateral lumpectomy changes in the upper central left breast and upper central right breast. No mass, nonsurgical distortion, or suspicious microcalcification is identified in either breast to suggest malignancy. Mammographic images were processed with CAD. IMPRESSION: No evidence of malignancy in either breast. Lumpectomy changes bilaterally. RECOMMENDATION: Diagnostic mammogram is suggested in 1 year. (Code:DM-B-01Y)  CT CHEST, ABDOMEN PELVIS W CONTRAST 10/24/16 IMPRESSION: 1. The numerous tiny bilateral pulmonary nodules seen on the previous imaging exams are no longer evident, suggesting response of therapy. 2. Large ill-defined irregular right hepatic lesion measures smaller on today's study. 3. Slight increase in size of hepatoduodenal ligament lymphadenopathy. 4. Abdominal Aortic Atherosclerois (ICD10-170.0)  CT CAP 01/05/17 IMPRESSION: 1. Left lower lobe pulmonary nodule and hepatic metastatic disease are stable. 2. Hepatoduodenal ligament lymph node is stable. 3. Aortic atherosclerosis (ICD10-170.0). Coronary artery calcification. 4. Enlarged pulmonary arteries, indicative of pulmonary arterial hypertension. 5. Small bilateral adrenal nodules are unchanged.   ASSESSMENT: Sharolyn Douglas 61 y.o. female   1. Colon Cancer, Stage I (pT2N0), MMR normal, oligo recurrent disease in liver in 12/2014, KRAS mutation (+), liver and lung recurrence in 06/2016 -I previously reviewed the nature history of metastatic colon cancer, and the potential curative approach with chemotherapy followed by liver lesion ablation or resection. However, more likely, this is not curable disease. -She initially received 3 cycles of Capeox, does not want to proceed with fourth cycle due to the moderate side effects from previous chemotherapy treatment. She declined liver mass resection, and underwent liver ablation by interventional radiologist Dr. Earleen Newport in 08/2015. - I previously reviewed her restaging CT  scan from 06/17/2016, which unfortunately showed new area of infiltrative disease as the previous ablated liver lesion, likely recurrence. She also developed several new pulmonary nodules, small, concerning for metastatic disease. 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 --She has started chemotherapy FOLFIRI and avastin, has been tolerating moderately well, we'll continue every 2 weeks. -Her CEA has significantly decreased since she started chemotherapy, likely responding to her good response to chemotherapy. -Repeat CT scan on 10/23/16 showed resolution of numerous bilateral pulmonary nodules, the right hepatic lesion is smaller, and slight decrease in size of hepatoduodenal ligament lymphadenopathy. No other new lesions  -I reviewed her restaging CT scan from 01/05/2017, which showed stable pulmonary and liver metastasis, no other new lesions. --I encouraged her to continue chemotherapy for at least 3 more months, then we will repeat a PET scan. She agrees, but really wants to come off chemo if possible and she is interested in liver targeted therapy if it's possible down the road. -Lab review, adequate for treatment, we'll proceed chemotherapy today and continue every 2 weeks.  2. Right breast cancer, pT1bN0M0, stage Ia, ER+/PR+/HER2-, History of left Breast Cancer, diagnosed on 02/01/2015 --Left CIS in situ, s/p lumpectomy in 2006 and 2011 by Dr. Margot Chimes, and radiation in 2011 by Dr. Sondra Come.  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 Surgical margins were negative. -I previously reviewed her Oncotype DX result. Her recurrence score is 22, which predicts  14% 10 year risk of distant recurrence with tamoxifen alone. This is intermediate risk, based on her relatively low number, I do not recommend adjuvant chemotherapy. -She has completed adjuvant  breast radiation -her [place] level supports she is postmenopausal -She has started adjuvant letrozole, tolerating well, we'll continue. -We previously discussed breast cancer surveillance, I strongly encouraged her to continue annual screening mammogram, self exam, and follow-up with [place] routinely. -her recent mammogram was normal 10/2016  3. History of PE (01/06/2014) --She has a diagnosis of pulmonary embolism, likely secondary to malignancy in the setting of family history for stroke (and possible stroke history in patient as well). Lower extremity dopplers negative.  -continue Xarelto. Due to her metastatic colon cancer, I previously recommended her to continue indefinitely.  4.  Uterine Fibroids  --Negative endometrial biopsy. Following with Gynecology.  5. Kidney lesion (Right). -No hypermetabolic right kidney lesion on the pet scan -Repeat abdominal MRI 05/29/2015 showed unchanged right renal lesion, favored to represent a complex/septated cyst.  6. Right flank pain - possible related to her liver metastasis -improved with chemo, seems to be worse lately  -She'll continue hydrocodone as needed, I encouraged her to take as needed to better control her pain.   7.  Morbid obesity - I have previously encouraged her to eat healthy and exercise more.  8. HTN  -The patient's blood pressure is slightly elevated today. Could be related to the Avastin. -She does not have a monitor at home. I have advised her to monitor at home. She will continue following up with her PCP  9. Nose bleeds - I have previously  discussed the patient her nosebleeds could be related to Avastin  -she knows to avoid blowing noses   Plan  -Scan reviewed, SD.  -Lab reviewed, adequate for treatment, we'll continue chemotherapy today and every 2 weeks  -I refilled Norco today  -I'll schedule 3 more treatments -I'll see her back in 4 weeks.   All questions were answered. The patient knows to call the clinic with  any problems, questions or concerns. We can certainly see the patient much sooner if necessary.  I spent 20 minutes counseling the patient face to face. The total time spent in the appointment was 25 minutes.  This document serves as a record of services personally performed by Veronica Reyes, Veronica. It was created on her behalf by Maryla Morrow, a trained medical scribe. The creation of this record is based on the scribe's personal observations and the provider's statements to them. This document has been checked and approved by the attending provider.  I have reviewed the above documentation for accuracy and completeness, and I agree with the above information.      Veronica Reyes  01/09/2017

## 2017-01-08 ENCOUNTER — Ambulatory Visit: Payer: BLUE CROSS/BLUE SHIELD

## 2017-01-08 ENCOUNTER — Ambulatory Visit (HOSPITAL_BASED_OUTPATIENT_CLINIC_OR_DEPARTMENT_OTHER): Payer: BLUE CROSS/BLUE SHIELD

## 2017-01-08 ENCOUNTER — Other Ambulatory Visit (HOSPITAL_BASED_OUTPATIENT_CLINIC_OR_DEPARTMENT_OTHER): Payer: BLUE CROSS/BLUE SHIELD

## 2017-01-08 ENCOUNTER — Ambulatory Visit (HOSPITAL_BASED_OUTPATIENT_CLINIC_OR_DEPARTMENT_OTHER): Payer: BLUE CROSS/BLUE SHIELD | Admitting: Hematology

## 2017-01-08 ENCOUNTER — Other Ambulatory Visit: Payer: BLUE CROSS/BLUE SHIELD

## 2017-01-08 VITALS — BP 124/78 | HR 56

## 2017-01-08 VITALS — BP 140/76 | HR 64 | Temp 98.9°F | Resp 18 | Ht 66.0 in | Wt 268.6 lb

## 2017-01-08 DIAGNOSIS — C187 Malignant neoplasm of sigmoid colon: Secondary | ICD-10-CM

## 2017-01-08 DIAGNOSIS — Z86718 Personal history of other venous thrombosis and embolism: Secondary | ICD-10-CM | POA: Diagnosis not present

## 2017-01-08 DIAGNOSIS — C50511 Malignant neoplasm of lower-outer quadrant of right female breast: Secondary | ICD-10-CM

## 2017-01-08 DIAGNOSIS — Z170 Estrogen receptor positive status [ER+]: Secondary | ICD-10-CM | POA: Diagnosis not present

## 2017-01-08 DIAGNOSIS — E66813 Obesity, class 3: Secondary | ICD-10-CM

## 2017-01-08 DIAGNOSIS — Z5112 Encounter for antineoplastic immunotherapy: Secondary | ICD-10-CM | POA: Diagnosis not present

## 2017-01-08 DIAGNOSIS — Z5111 Encounter for antineoplastic chemotherapy: Secondary | ICD-10-CM

## 2017-01-08 DIAGNOSIS — I10 Essential (primary) hypertension: Secondary | ICD-10-CM | POA: Diagnosis not present

## 2017-01-08 DIAGNOSIS — C787 Secondary malignant neoplasm of liver and intrahepatic bile duct: Secondary | ICD-10-CM

## 2017-01-08 DIAGNOSIS — Z95828 Presence of other vascular implants and grafts: Secondary | ICD-10-CM

## 2017-01-08 LAB — CBC WITH DIFFERENTIAL/PLATELET
BASO%: 0.8 % (ref 0.0–2.0)
BASOS ABS: 0.1 10*3/uL (ref 0.0–0.1)
EOS%: 1 % (ref 0.0–7.0)
Eosinophils Absolute: 0.1 10*3/uL (ref 0.0–0.5)
HCT: 37.6 % (ref 34.8–46.6)
HEMOGLOBIN: 12.7 g/dL (ref 11.6–15.9)
LYMPH%: 43.5 % (ref 14.0–49.7)
MCH: 31.9 pg (ref 25.1–34.0)
MCHC: 33.6 g/dL (ref 31.5–36.0)
MCV: 94.7 fL (ref 79.5–101.0)
MONO#: 0.4 10*3/uL (ref 0.1–0.9)
MONO%: 5.8 % (ref 0.0–14.0)
NEUT#: 3.2 10*3/uL (ref 1.5–6.5)
NEUT%: 48.9 % (ref 38.4–76.8)
Platelets: 253 10*3/uL (ref 145–400)
RBC: 3.97 10*6/uL (ref 3.70–5.45)
RDW: 15.6 % — AB (ref 11.2–14.5)
WBC: 6.5 10*3/uL (ref 3.9–10.3)
lymph#: 2.9 10*3/uL (ref 0.9–3.3)

## 2017-01-08 LAB — COMPREHENSIVE METABOLIC PANEL
ALT: 24 U/L (ref 0–55)
AST: 30 U/L (ref 5–34)
Albumin: 3.4 g/dL — ABNORMAL LOW (ref 3.5–5.0)
Alkaline Phosphatase: 38 U/L — ABNORMAL LOW (ref 40–150)
Anion Gap: 10 mEq/L (ref 3–11)
BUN: 10.2 mg/dL (ref 7.0–26.0)
CO2: 28 meq/L (ref 22–29)
Calcium: 9.7 mg/dL (ref 8.4–10.4)
Chloride: 105 mEq/L (ref 98–109)
Creatinine: 0.8 mg/dL (ref 0.6–1.1)
EGFR: >90 mL/min/{1.73_m2} (ref >90)
Glucose: 107 mg/dl (ref 70–140)
POTASSIUM: 3 meq/L — AB (ref 3.5–5.1)
Sodium: 142 mEq/L (ref 136–145)
Total Bilirubin: 0.64 mg/dL (ref 0.20–1.20)
Total Protein: 7.5 g/dL (ref 6.4–8.3)

## 2017-01-08 MED ORDER — SODIUM CHLORIDE 0.9% FLUSH
10.0000 mL | INTRAVENOUS | Status: DC | PRN
  Filled 2017-01-08: qty 10

## 2017-01-08 MED ORDER — SODIUM CHLORIDE 0.9 % IV SOLN
Freq: Once | INTRAVENOUS | Status: AC
  Administered 2017-01-08: 13:00:00 via INTRAVENOUS

## 2017-01-08 MED ORDER — SODIUM CHLORIDE 0.9% FLUSH
10.0000 mL | INTRAVENOUS | Status: DC | PRN
  Administered 2017-01-08: 10 mL via INTRAVENOUS
  Filled 2017-01-08: qty 10

## 2017-01-08 MED ORDER — HYDROCODONE-ACETAMINOPHEN 5-325 MG PO TABS: 1.0000 | ORAL_TABLET | Freq: Four times a day (QID) | ORAL | 0 refills | Status: DC | PRN

## 2017-01-08 MED ORDER — DEXAMETHASONE 4 MG PO TABS: 4.0000 mg | ORAL_TABLET | Freq: Every day | ORAL | 1 refills | Status: DC

## 2017-01-08 MED ORDER — ATROPINE SULFATE 1 MG/ML IJ SOLN
0.5000 mg | Freq: Once | INTRAMUSCULAR | Status: AC | PRN
  Administered 2017-01-08: 0.5 mg via INTRAVENOUS

## 2017-01-08 MED ORDER — DEXAMETHASONE SODIUM PHOSPHATE 10 MG/ML IJ SOLN
10.0000 mg | Freq: Once | INTRAMUSCULAR | Status: AC
  Administered 2017-01-08: 10 mg via INTRAVENOUS

## 2017-01-08 MED ORDER — DEXAMETHASONE SODIUM PHOSPHATE 10 MG/ML IJ SOLN
INTRAMUSCULAR | Status: AC
  Filled 2017-01-08: qty 1

## 2017-01-08 MED ORDER — IRINOTECAN HCL CHEMO INJECTION 100 MG/5ML
160.0000 mg/m2 | Freq: Once | INTRAVENOUS | Status: AC
  Administered 2017-01-08: 400 mg via INTRAVENOUS
  Filled 2017-01-08: qty 5

## 2017-01-08 MED ORDER — LEUCOVORIN CALCIUM INJECTION 350 MG
400.0000 mg/m2 | Freq: Once | INTRAVENOUS | Status: AC
  Administered 2017-01-08: 976 mg via INTRAVENOUS
  Filled 2017-01-08: qty 48.8

## 2017-01-08 MED ORDER — SODIUM CHLORIDE 0.9 % IV SOLN
2400.0000 mg/m2 | INTRAVENOUS | Status: DC
  Administered 2017-01-08: 5850 mg via INTRAVENOUS
  Filled 2017-01-08: qty 117

## 2017-01-08 MED ORDER — SODIUM CHLORIDE 0.9 % IV SOLN
5.0000 mg/kg | Freq: Once | INTRAVENOUS | Status: AC
  Administered 2017-01-08: 650 mg via INTRAVENOUS
  Filled 2017-01-08: qty 16

## 2017-01-08 MED ORDER — PALONOSETRON HCL INJECTION 0.25 MG/5ML
INTRAVENOUS | Status: AC
  Filled 2017-01-08: qty 5

## 2017-01-08 MED ORDER — ATROPINE SULFATE 1 MG/ML IJ SOLN
INTRAMUSCULAR | Status: AC
  Filled 2017-01-08: qty 1

## 2017-01-08 MED ORDER — PALONOSETRON HCL INJECTION 0.25 MG/5ML
0.2500 mg | Freq: Once | INTRAVENOUS | Status: AC
  Administered 2017-01-08: 0.25 mg via INTRAVENOUS

## 2017-01-08 NOTE — Patient Instructions (Signed)
Implanted Port Home Guide An implanted port is a type of central line that is placed under the skin. Central lines are used to provide IV access when treatment or nutrition needs to be given through a person's veins. Implanted ports are used for long-term IV access. An implanted port may be placed because:  You need IV medicine that would be irritating to the small veins in your hands or arms.  You need long-term IV medicines, such as antibiotics.  You need IV nutrition for a long period.  You need frequent blood draws for lab tests.  You need dialysis.  Implanted ports are usually placed in the chest area, but they can also be placed in the upper arm, the abdomen, or the leg. An implanted port has two main parts:  Reservoir. The reservoir is round and will appear as a small, raised area under your skin. The reservoir is the part where a needle is inserted to give medicines or draw blood.  Catheter. The catheter is a thin, flexible tube that extends from the reservoir. The catheter is placed into a large vein. Medicine that is inserted into the reservoir goes into the catheter and then into the vein.  How will I care for my incision site? Do not get the incision site wet. Bathe or shower as directed by your health care provider. How is my port accessed? Special steps must be taken to access the port:  Before the port is accessed, a numbing cream can be placed on the skin. This helps numb the skin over the port site.  Your health care provider uses a sterile technique to access the port. ? Your health care provider must put on a mask and sterile gloves. ? The skin over your port is cleaned carefully with an antiseptic and allowed to dry. ? The port is gently pinched between sterile gloves, and a needle is inserted into the port.  Only "non-coring" port needles should be used to access the port. Once the port is accessed, a blood return should be checked. This helps ensure that the port  is in the vein and is not clogged.  If your port needs to remain accessed for a constant infusion, a clear (transparent) bandage will be placed over the needle site. The bandage and needle will need to be changed every week, or as directed by your health care provider.  Keep the bandage covering the needle clean and dry. Do not get it wet. Follow your health care provider's instructions on how to take a shower or bath while the port is accessed.  If your port does not need to stay accessed, no bandage is needed over the port.  What is flushing? Flushing helps keep the port from getting clogged. Follow your health care provider's instructions on how and when to flush the port. Ports are usually flushed with saline solution or a medicine called heparin. The need for flushing will depend on how the port is used.  If the port is used for intermittent medicines or blood draws, the port will need to be flushed: ? After medicines have been given. ? After blood has been drawn. ? As part of routine maintenance.  If a constant infusion is running, the port may not need to be flushed.  How long will my port stay implanted? The port can stay in for as long as your health care provider thinks it is needed. When it is time for the port to come out, surgery will be   done to remove it. The procedure is similar to the one performed when the port was put in. When should I seek immediate medical care? When you have an implanted port, you should seek immediate medical care if:  You notice a bad smell coming from the incision site.  You have swelling, redness, or drainage at the incision site.  You have more swelling or pain at the port site or the surrounding area.  You have a fever that is not controlled with medicine.  This information is not intended to replace advice given to you by your health care provider. Make sure you discuss any questions you have with your health care provider. Document  Released: 09/01/2005 Document Revised: 02/07/2016 Document Reviewed: 05/09/2013 Elsevier Interactive Patient Education  2017 [employer]  

## 2017-01-08 NOTE — Progress Notes (Signed)
Per Dr. Burr Medico okay to treat with potassium of 3.0.   Patient had episode of vomiting post treatment and pump connection. Patient stated she "felt much better" after episode and refused any nausea medication at this time. Dr. Burr Medico notified. Per Dr. Burr Medico patient to receive compazine next treatment in the middle of chemotherapy infusion. Patient stable upon discharge.

## 2017-01-08 NOTE — Patient Instructions (Signed)
[facility] Discharge Instructions for Patients Receiving Chemotherapy  Today you received the following chemotherapy agents: Avastin, Irinotecan, Leucovorin and Adrucil   To help prevent nausea and vomiting after your treatment, we encourage you to take your nausea medication as directed.    If you develop nausea and vomiting that is not controlled by your nausea medication, call the clinic.   BELOW ARE SYMPTOMS THAT SHOULD BE REPORTED IMMEDIATELY:  *FEVER GREATER THAN 100.5 F  *CHILLS WITH OR WITHOUT FEVER  NAUSEA AND VOMITING THAT IS NOT CONTROLLED WITH YOUR NAUSEA MEDICATION  *UNUSUAL SHORTNESS OF BREATH  *UNUSUAL BRUISING OR BLEEDING  TENDERNESS IN MOUTH AND THROAT WITH OR WITHOUT PRESENCE OF ULCERS  *URINARY PROBLEMS  *BOWEL PROBLEMS  UNUSUAL RASH Items with * indicate a potential emergency and should be followed up as soon as possible.  Feel free to call the clinic you have any questions or concerns. The clinic phone number is [phone].  Please show the CHEMO ALERT CARD at check-in to the Emergency Department and triage nurse.   

## 2017-01-09 ENCOUNTER — Encounter: Payer: Self-pay | Admitting: Hematology

## 2017-01-10 ENCOUNTER — Ambulatory Visit (HOSPITAL_BASED_OUTPATIENT_CLINIC_OR_DEPARTMENT_OTHER): Payer: BLUE CROSS/BLUE SHIELD

## 2017-01-10 VITALS — HR 66 | Temp 98.0°F | Resp 20

## 2017-01-10 DIAGNOSIS — C187 Malignant neoplasm of sigmoid colon: Secondary | ICD-10-CM | POA: Diagnosis not present

## 2017-01-10 MED ORDER — SODIUM CHLORIDE 0.9% FLUSH
10.0000 mL | INTRAVENOUS | Status: DC | PRN
  Administered 2017-01-10: 10 mL
  Filled 2017-01-10: qty 10

## 2017-01-10 MED ORDER — HEPARIN SOD (PORK) LOCK FLUSH 100 UNIT/ML IV SOLN
500.0000 [IU] | Freq: Once | INTRAVENOUS | Status: AC | PRN
  Administered 2017-01-10: 500 [IU]
  Filled 2017-01-10: qty 5

## 2017-01-10 NOTE — Progress Notes (Signed)
5.2 mL 28fu left on pump - bolused.

## 2017-01-13 ENCOUNTER — Telehealth: Payer: Self-pay | Admitting: Hematology

## 2017-01-13 NOTE — Telephone Encounter (Signed)
Patient bypassed scheduling area on 01/08/17. Message sent to [facility] for patient to be added for Chemo on 01/22/17. Appointments pending approval.

## 2017-01-14 ENCOUNTER — Telehealth: Payer: Self-pay | Admitting: Hematology

## 2017-01-14 NOTE — Telephone Encounter (Signed)
Patient bypassed scheduling area on 01/08/17.  Called patient to schedule and confirm labs, follow up and Chemo appointments. Patient was not available and had no voice mail activated on either phones listed. Unable to leave voicemail message. Appointment letter and schedule was mailed to patient, per 01/08/17 los.

## 2017-01-20 ENCOUNTER — Telehealth: Payer: Self-pay | Admitting: *Deleted

## 2017-01-20 NOTE — Telephone Encounter (Signed)
Pt called requesting refill of Tylenol # 4 . Pt's   Phone    [phone].

## 2017-01-20 NOTE — Telephone Encounter (Signed)
  Ok to refill, thanks   [name] MD

## 2017-01-21 ENCOUNTER — Other Ambulatory Visit: Payer: Self-pay | Admitting: *Deleted

## 2017-01-21 DIAGNOSIS — C187 Malignant neoplasm of sigmoid colon: Secondary | ICD-10-CM

## 2017-01-21 MED ORDER — ACETAMINOPHEN-CODEINE #4 300-60 MG PO TABS: 1.0000 | ORAL_TABLET | Freq: Three times a day (TID) | ORAL | 0 refills | Status: DC | PRN

## 2017-01-21 NOTE — Telephone Encounter (Signed)
Attempted to call pt to inform her of script ready for pick up.  Unable to leave message due to no voice mail available. Script left in prescription book.

## 2017-01-29 ENCOUNTER — Ambulatory Visit (HOSPITAL_BASED_OUTPATIENT_CLINIC_OR_DEPARTMENT_OTHER): Payer: BLUE CROSS/BLUE SHIELD

## 2017-01-29 ENCOUNTER — Other Ambulatory Visit: Payer: Self-pay

## 2017-01-29 ENCOUNTER — Other Ambulatory Visit (HOSPITAL_BASED_OUTPATIENT_CLINIC_OR_DEPARTMENT_OTHER): Payer: BLUE CROSS/BLUE SHIELD

## 2017-01-29 ENCOUNTER — Ambulatory Visit: Payer: BLUE CROSS/BLUE SHIELD

## 2017-01-29 VITALS — BP 126/84 | HR 96 | Temp 98.3°F | Resp 18

## 2017-01-29 DIAGNOSIS — C187 Malignant neoplasm of sigmoid colon: Secondary | ICD-10-CM | POA: Diagnosis not present

## 2017-01-29 DIAGNOSIS — Z170 Estrogen receptor positive status [ER+]: Secondary | ICD-10-CM

## 2017-01-29 DIAGNOSIS — C787 Secondary malignant neoplasm of liver and intrahepatic bile duct: Secondary | ICD-10-CM

## 2017-01-29 DIAGNOSIS — C50511 Malignant neoplasm of lower-outer quadrant of right female breast: Secondary | ICD-10-CM

## 2017-01-29 DIAGNOSIS — Z5112 Encounter for antineoplastic immunotherapy: Secondary | ICD-10-CM

## 2017-01-29 DIAGNOSIS — Z5111 Encounter for antineoplastic chemotherapy: Secondary | ICD-10-CM | POA: Diagnosis not present

## 2017-01-29 DIAGNOSIS — Z95828 Presence of other vascular implants and grafts: Secondary | ICD-10-CM

## 2017-01-29 LAB — COMPREHENSIVE METABOLIC PANEL
ALBUMIN: 3.3 g/dL — AB (ref 3.5–5.0)
ALK PHOS: 43 U/L (ref 40–150)
ALT: 25 U/L (ref 0–55)
ANION GAP: 10 meq/L (ref 3–11)
AST: 36 U/L — ABNORMAL HIGH (ref 5–34)
BILIRUBIN TOTAL: 0.5 mg/dL (ref 0.20–1.20)
BUN: 11.9 mg/dL (ref 7.0–26.0)
CO2: 24 mEq/L (ref 22–29)
Calcium: 10 mg/dL (ref 8.4–10.4)
Chloride: 107 mEq/L (ref 98–109)
Creatinine: 0.8 mg/dL (ref 0.6–1.1)
GLUCOSE: 105 mg/dL (ref 70–140)
POTASSIUM: 3.7 meq/L (ref 3.5–5.1)
SODIUM: 141 meq/L (ref 136–145)
TOTAL PROTEIN: 7.8 g/dL (ref 6.4–8.3)

## 2017-01-29 LAB — CBC WITH DIFFERENTIAL/PLATELET
BASO%: 0.7 % (ref 0.0–2.0)
BASOS ABS: 0 10*3/uL (ref 0.0–0.1)
EOS ABS: 0.1 10*3/uL (ref 0.0–0.5)
EOS%: 0.9 % (ref 0.0–7.0)
HCT: 40 % (ref 34.8–46.6)
HGB: 13.3 g/dL (ref 11.6–15.9)
LYMPH%: 36.4 % (ref 14.0–49.7)
MCH: 31.8 pg (ref 25.1–34.0)
MCHC: 33.3 g/dL (ref 31.5–36.0)
MCV: 95.6 fL (ref 79.5–101.0)
MONO#: 0.7 10*3/uL (ref 0.1–0.9)
MONO%: 11.3 % (ref 0.0–14.0)
NEUT#: 3.3 10*3/uL (ref 1.5–6.5)
NEUT%: 50.7 % (ref 38.4–76.8)
Platelets: 285 10*3/uL (ref 145–400)
RBC: 4.18 10*6/uL (ref 3.70–5.45)
RDW: 17.1 % — AB (ref 11.2–14.5)
WBC: 6.6 10*3/uL (ref 3.9–10.3)
lymph#: 2.4 10*3/uL (ref 0.9–3.3)

## 2017-01-29 LAB — CEA (IN HOUSE-CHCC): CEA (CHCC-IN HOUSE): 4.58 ng/mL (ref 0.00–5.00)

## 2017-01-29 MED ORDER — POTASSIUM CHLORIDE CRYS ER 20 MEQ PO TBCR: 20.0000 meq | EXTENDED_RELEASE_TABLET | Freq: Two times a day (BID) | ORAL | 1 refills | Status: DC

## 2017-01-29 MED ORDER — ATROPINE SULFATE 1 MG/ML IJ SOLN
0.5000 mg | Freq: Once | INTRAMUSCULAR | Status: AC | PRN
  Administered 2017-01-29: 0.5 mg via INTRAVENOUS

## 2017-01-29 MED ORDER — LIDOCAINE-PRILOCAINE 2.5-2.5 % EX CREA: 1.0000 "application " | TOPICAL_CREAM | CUTANEOUS | 2 refills | Status: DC | PRN

## 2017-01-29 MED ORDER — LEUCOVORIN CALCIUM INJECTION 350 MG
400.0000 mg/m2 | Freq: Once | INTRAVENOUS | Status: AC
  Administered 2017-01-29: 976 mg via INTRAVENOUS
  Filled 2017-01-29: qty 48.8

## 2017-01-29 MED ORDER — SODIUM CHLORIDE 0.9 % IV SOLN
4.7000 mg/kg | Freq: Once | INTRAVENOUS | Status: AC
  Administered 2017-01-29: 600 mg via INTRAVENOUS
  Filled 2017-01-29: qty 16

## 2017-01-29 MED ORDER — ATROPINE SULFATE 1 MG/ML IJ SOLN
INTRAMUSCULAR | Status: AC
  Filled 2017-01-29: qty 1

## 2017-01-29 MED ORDER — SODIUM CHLORIDE 0.9 % IV SOLN
Freq: Once | INTRAVENOUS | Status: AC
  Administered 2017-01-29: 09:00:00 via INTRAVENOUS

## 2017-01-29 MED ORDER — DEXAMETHASONE SODIUM PHOSPHATE 10 MG/ML IJ SOLN
10.0000 mg | Freq: Once | INTRAMUSCULAR | Status: AC
  Administered 2017-01-29: 10 mg via INTRAVENOUS

## 2017-01-29 MED ORDER — SODIUM CHLORIDE 0.9% FLUSH
10.0000 mL | INTRAVENOUS | Status: DC | PRN
  Administered 2017-01-29: 10 mL via INTRAVENOUS
  Filled 2017-01-29: qty 10

## 2017-01-29 MED ORDER — DEXAMETHASONE SODIUM PHOSPHATE 10 MG/ML IJ SOLN
INTRAMUSCULAR | Status: AC
  Filled 2017-01-29: qty 1

## 2017-01-29 MED ORDER — IRINOTECAN HCL CHEMO INJECTION 100 MG/5ML
160.0000 mg/m2 | Freq: Once | INTRAVENOUS | Status: AC
  Administered 2017-01-29: 400 mg via INTRAVENOUS
  Filled 2017-01-29: qty 15

## 2017-01-29 MED ORDER — PROCHLORPERAZINE MALEATE 10 MG PO TABS
ORAL_TABLET | ORAL | Status: AC
  Filled 2017-01-29: qty 1

## 2017-01-29 MED ORDER — PROCHLORPERAZINE MALEATE 10 MG PO TABS
10.0000 mg | ORAL_TABLET | Freq: Once | ORAL | Status: AC
  Administered 2017-01-29: 10 mg via ORAL

## 2017-01-29 MED ORDER — SODIUM CHLORIDE 0.9 % IV SOLN
2400.0000 mg/m2 | INTRAVENOUS | Status: DC
  Administered 2017-01-29: 5850 mg via INTRAVENOUS
  Filled 2017-01-29: qty 117

## 2017-01-29 MED ORDER — PALONOSETRON HCL INJECTION 0.25 MG/5ML
INTRAVENOUS | Status: AC
  Filled 2017-01-29: qty 5

## 2017-01-29 MED ORDER — PALONOSETRON HCL INJECTION 0.25 MG/5ML
0.2500 mg | Freq: Once | INTRAVENOUS | Status: AC
  Administered 2017-01-29: 0.25 mg via INTRAVENOUS

## 2017-01-29 NOTE — Patient Instructions (Signed)
Implanted Port Home Guide An implanted port is a type of central line that is placed under the skin. Central lines are used to provide IV access when treatment or nutrition needs to be given through a person's veins. Implanted ports are used for long-term IV access. An implanted port may be placed because:  You need IV medicine that would be irritating to the small veins in your hands or arms.  You need long-term IV medicines, such as antibiotics.  You need IV nutrition for a long period.  You need frequent blood draws for lab tests.  You need dialysis.  Implanted ports are usually placed in the chest area, but they can also be placed in the upper arm, the abdomen, or the leg. An implanted port has two main parts:  Reservoir. The reservoir is round and will appear as a small, raised area under your skin. The reservoir is the part where a needle is inserted to give medicines or draw blood.  Catheter. The catheter is a thin, flexible tube that extends from the reservoir. The catheter is placed into a large vein. Medicine that is inserted into the reservoir goes into the catheter and then into the vein.  How will I care for my incision site? Do not get the incision site wet. Bathe or shower as directed by your health care provider. How is my port accessed? Special steps must be taken to access the port:  Before the port is accessed, a numbing cream can be placed on the skin. This helps numb the skin over the port site.  Your health care provider uses a sterile technique to access the port. ? Your health care provider must put on a mask and sterile gloves. ? The skin over your port is cleaned carefully with an antiseptic and allowed to dry. ? The port is gently pinched between sterile gloves, and a needle is inserted into the port.  Only "non-coring" port needles should be used to access the port. Once the port is accessed, a blood return should be checked. This helps ensure that the port  is in the vein and is not clogged.  If your port needs to remain accessed for a constant infusion, a clear (transparent) bandage will be placed over the needle site. The bandage and needle will need to be changed every week, or as directed by your health care provider.  Keep the bandage covering the needle clean and dry. Do not get it wet. Follow your health care provider's instructions on how to take a shower or bath while the port is accessed.  If your port does not need to stay accessed, no bandage is needed over the port.  What is flushing? Flushing helps keep the port from getting clogged. Follow your health care provider's instructions on how and when to flush the port. Ports are usually flushed with saline solution or a medicine called heparin. The need for flushing will depend on how the port is used.  If the port is used for intermittent medicines or blood draws, the port will need to be flushed: ? After medicines have been given. ? After blood has been drawn. ? As part of routine maintenance.  If a constant infusion is running, the port may not need to be flushed.  How long will my port stay implanted? The port can stay in for as long as your health care provider thinks it is needed. When it is time for the port to come out, surgery will be   done to remove it. The procedure is similar to the one performed when the port was put in. When should I seek immediate medical care? When you have an implanted port, you should seek immediate medical care if:  You notice a bad smell coming from the incision site.  You have swelling, redness, or drainage at the incision site.  You have more swelling or pain at the port site or the surrounding area.  You have a fever that is not controlled with medicine.  This information is not intended to replace advice given to you by your health care provider. Make sure you discuss any questions you have with your health care provider. Document  Released: 09/01/2005 Document Revised: 02/07/2016 Document Reviewed: 05/09/2013 Elsevier Interactive Patient Education  2017 [employer]  

## 2017-01-29 NOTE — Patient Instructions (Signed)
[facility] Discharge Instructions for Patients Receiving Chemotherapy  Today you received the following chemotherapy agents: Avastin, Irinotecan, Leucovorin and Adrucil   To help prevent nausea and vomiting after your treatment, we encourage you to take your nausea medication as directed.    If you develop nausea and vomiting that is not controlled by your nausea medication, call the clinic.   BELOW ARE SYMPTOMS THAT SHOULD BE REPORTED IMMEDIATELY:  *FEVER GREATER THAN 100.5 F  *CHILLS WITH OR WITHOUT FEVER  NAUSEA AND VOMITING THAT IS NOT CONTROLLED WITH YOUR NAUSEA MEDICATION  *UNUSUAL SHORTNESS OF BREATH  *UNUSUAL BRUISING OR BLEEDING  TENDERNESS IN MOUTH AND THROAT WITH OR WITHOUT PRESENCE OF ULCERS  *URINARY PROBLEMS  *BOWEL PROBLEMS  UNUSUAL RASH Items with * indicate a potential emergency and should be followed up as soon as possible.  Feel free to call the clinic you have any questions or concerns. The clinic phone number is [phone].  Please show the CHEMO ALERT CARD at check-in to the Emergency Department and triage nurse.   

## 2017-01-31 ENCOUNTER — Ambulatory Visit (HOSPITAL_BASED_OUTPATIENT_CLINIC_OR_DEPARTMENT_OTHER): Payer: Self-pay

## 2017-01-31 VITALS — BP 146/89 | HR 75 | Temp 98.9°F | Resp 17

## 2017-01-31 DIAGNOSIS — C187 Malignant neoplasm of sigmoid colon: Secondary | ICD-10-CM

## 2017-01-31 MED ORDER — HEPARIN SOD (PORK) LOCK FLUSH 100 UNIT/ML IV SOLN
500.0000 [IU] | Freq: Once | INTRAVENOUS | Status: AC | PRN
  Administered 2017-01-31: 500 [IU]
  Filled 2017-01-31: qty 5

## 2017-01-31 MED ORDER — SODIUM CHLORIDE 0.9% FLUSH
10.0000 mL | INTRAVENOUS | Status: DC | PRN
  Administered 2017-01-31: 10 mL
  Filled 2017-01-31: qty 10

## 2017-01-31 NOTE — Patient Instructions (Signed)

## 2017-02-05 NOTE — Progress Notes (Signed)
[facility] PROGRESS NOTE DATE OF VISIT: 02/12/2017   Veronica Docker, MD 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 Left breast DCIS diagnosed in 2006 and 2010, status post lumpectomy. 2. pT2N0M0 stage I colon cancer, s/p partial colectomy on 03/09/2014  3. PE in 12/2013 when she was diagnosed with colon cancer. 4. 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 [facility])   Staging form: Breast, AJCC 7th Edition     Clinical stage from 02/01/2015: Stage IA (T1a, N0, M0) - Signed by Veronica Merle, MD on 06/04/2015     Pathologic stage from 11/10/2015: Stage IA (T1b, N0, M0) - Signed by Veronica Merle, MD on 12/08/2015 Cancer of sigmoid colon [facility])   Staging form: Colon and Rectum, AJCC 7th Edition     Clinical: Stage I (T2, N0, M0) - Unsigned     Breast cancer of lower-outer quadrant of right female breast ([facility]yes)   2006 Cancer Diagnosis    Left breast DCIS diagnosed in 2006 and 2010, status post lumpectomy.      01/07/2014 Initial Diagnosis    Breast cancer      01/18/2015 Mammogram    67m mass in lower outer quadrant of right breast       02/01/2015 Initial Biopsy    (R) breast mass biopsy showed invasive ductal carcinoma & DCIS, grade 1-2.       02/01/2015 Receptors her2    ER 90%+, PR  10%+, Ki67 10%, HER2 negative (ratio 1.09)       11/07/2015 Surgery    (R) breast lumpectomy and sentinel lymph node biopsy ([facility]      11/07/2015 Pathology Results    (R) breast lumpectomy showed invasive ductal carcinoma, grade 2, 1 cm, low-grade DCIS, negative margins, 3 sentinel lymph nodes were negative. LVI (-). HER2 repeated and remains negative (ratio 1.29).        11/07/2015 Pathologic Stage    pT1b,pN0: Stage IA       11/07/2015 Oncotype testing    RS 22, which predicts a year risk of distant recurrence with tamoxifen alone 14% (intermediate-risk). Adjuvant chemo was not offered      02/21/2016 - 04/10/2016 Radiation Therapy    Adjuvant breast radiation. ([facility]onica Reyes). Right breast: 50.4 Gy in 28 fractions.  Right breast boost: 12 Gy in 6 fractions.      05/2016 -  Anti-estrogen oral therapy    Femara (Letrozole) 2.5 mg daily.       10/17/2016 Mammogram    MM DIAG BREAST TOMO BILATERAL 10/17/2016 FINDINGS: There are bilateral lumpectomy changes in the upper central left breast and upper central right breast. No mass, nonsurgical distortion, or suspicious microcalcification is identified in either breast to suggest malignancy. Mammographic images were processed with CAD. IMPRESSION: No evidence of malignancy in either breast. Lumpectomy changes bilaterally. RECOMMENDATION: Diagnostic mammogram is suggested in 1 year. (Code:[id number]-01Y)  Cancer of sigmoid colon ([facility])   03/04/2014 Initial Diagnosis    Cancer of sigmoid colon      03/09/2014 Pathology Results    G1 invasive adenocarcinoma, no lymph-Vascular invasion or Peri-neural invasion: Absent. MMR normal, MSI stable.       03/09/2014 Surgery    partial colectomy, negative margins.       11/10/2014 Relapse/Recurrence    biopsy confirmed oligo liver recurrence       11/10/2014 Imaging    PET showed two small ares of hypermetabolism in the right lobe of the liver, no other distant metastasis.       11/30/2014 Imaging     abdomen MRI showed a new enhancing lesion which corresponds to an area of abnormal hyper metabolism on recent PET-CT. Stable enlarged portal caval lymph node.       01/02/2015 Pathology Results    Liver biopsy showed metastatic adenocarcinoma, IHC (+) for CK20, CDX-2, (-) for TTF-1 and ER      01/02/2015 Miscellaneous    liver biopsy KRAS mutation (+)       02/14/2015 Imaging    CT showed:  the small metastatic right hepatic lobe liver lesion is not identified for certain on this examination. No new lesions. Stable small cysts in segment 4 a.       03/05/2015 - 04/16/2015 Chemotherapy    CAPEOX (capecitabine 2500 mg twice daily Day 1-14, oxaliplatin 1 30 mg/m on day 1, every 21 days, S/P 3 cycles       08/24/2015 Procedure    CT-guided oligo liver metastasis microwave ablation by Dr. Bobetta Reyes      06/17/2016 Imaging    CT of the chest/abd/pelvis with contrast 1. Several new small pulmonary nodules worrisome for metastatic disease. 2. New large area of probable infiltrating recurrent tumor in the right hepatic lobe surrounding the prior ablation site. 3. Small supraclavicular lymph nodes.  Attention on future studies. 4. Stable surgical changes involving both breasts. No breast masses are identified. 5. Stable bilateral adrenal gland nodules. 6. Stable upper abdominal and right-sided retroperitoneal lymph nodes.      07/08/2016 PET scan    1. Large mass in the right lobe of the liver demonstrates diffuse hypermetabolism, compatible with recurrent metastatic disease in the liver 2. Multiple small pulmonary nodules are very similar to the recent Chest CT 06/17/16.  3. No other findings of metastatic disease elsewhere in the neck, chest, abdomen, or pelvis. 4. There is some low-level hypermetabolic activity adjacent to the surgical clips in the lateral aspect of the right breast at the site of prior lumpectomy.       07/29/2016 Pathology Results    Liver biopsy confirmed metastatic  colon cancer       07/31/2016 -  Chemotherapy    FOLFIRI every 2 weeks, Avastin added from cycle 2       10/23/2016 Imaging    CT ABDOMEN PELVIS W CONTRAST 10/24/16 IMPRESSION: 1. The numerous tiny bilateral pulmonary nodules seen on the previous imaging exams are no longer evident, suggesting response of therapy. 2. Large ill-defined irregular right hepatic lesion measures smaller on today's study. 3. Slight increase in size of hepatoduodenal ligament lymphadenopathy. 4. Abdominal Aortic Atherosclerois (ICD10-170.0)      01/05/2017 Imaging    CT CAP IMPRESSION: 1. Left lower lobe pulmonary nodule and hepatic metastatic disease are stable. 2. Hepatoduodenal ligament lymph node is stable. 3. Aortic atherosclerosis (ICD10-170.0). Coronary artery calcification. 4. Enlarged pulmonary arteries, indicative of pulmonary arterial hypertension. 5.  Small bilateral adrenal nodules are unchanged.        CURRENT TREATMENT:   1. Letrozole 2.5 mg once daily, started in September 2017 2. Chemotherapy FOLFIRI and Avastin (from cycle 2) every 2 weeks, started on 07/31/2016, multiple delay per pt's request   INTERIM HISTORY:  Veronica Reyes 61 y.o. female with a history of recurrent left breast cancer, colon cancer s/p laparoscopic-assisted partial colectomy (03/09/2014), PE (01/06/2014), and right breast cancer (01/2015),  presents to clinic for follow up.   She present to the clinic today wanting to know about her blood work. She reports to feeling good today. She wants to know if she can skip her treatment today. She is about to graduate and her birthday is coming up. She wants to know how many treatments start. Her plans start tomorrow through to the 14/16th. Her last treatment she vomited from Thursday to Thursday, but since she recovered for over a week she now feels really good. She would like to start back in 3 weeks. Xeloda did not help her stop vomiting, but compazine helped her. She  hopes to come back after the 21st because of her plans. She had already did a cycle without irinotecan. She take hydrocortisone once in the evening and no more than 2-4 a day. Her pain goes to a 5/10.     MEDICAL HISTORY: Past Medical History:  Diagnosis Date  . Anxiety   . Breast CA ([employer])    radiation and surgery-lt.  . Cancer ([place])    left breast cancer x2  . Hypertension   . Liver lesion   . Pulmonary embolism ([place])    "blood clot in lungs" -dx. 4'15 with CT Chest. On Xarelto  . Radiation 01/22/10-03/12/10   left whole breast 4500 cGy, upper aspect boosted to 6120 cGy  . Radiation 02/21/16 - 04/10/16   right breast 50.4 Gy, right breast boost 12 Gy  . Shortness of breath    sob with exertion. dx. with Pulmonary emboli 4'15 ,tx. Xarelto,Lovenox used for [employer].    ALLERGIES:  is allergic to tramadol.  MEDICATIONS:  Allergies as of 02/12/2017      Reactions   Tramadol Other (See Comments)   Dizziness      Medication List       Accurate as of 02/12/17  8:53 AM. Always use your most recent med list.          acetaminophen-codeine 300-60 MG tablet Commonly known as:  TYLENOL #4 Take 1 tablet by mouth every 8 (eight) hours as needed. for pain   amLODipine 2.5 MG tablet Commonly known as:  NORVASC Take 2.5 mg by mouth daily.   dexamethasone 4 MG tablet Commonly known as:  DECADRON Take 1 tablet (4 mg total) by mouth daily.   ferrous sulfate 325 (65 FE) MG tablet Take 325 mg by mouth daily with breakfast. Pt takes  4 times per week.   hydrochlorothiazide 25 MG tablet Commonly known as:  HYDRODIURIL Take 1 tablet (25 mg total) by mouth every morning.   HYDROcodone-acetaminophen 5-325 MG tablet Commonly known as:  NORCO Take 1 tablet by mouth every 8 (eight) hours as needed for moderate pain.   letrozole 2.5 MG tablet Commonly known as:  FEMARA Take 1 tablet (2.5 mg total) by mouth daily.   lidocaine-prilocaine cream Commonly known as:  EMLA Apply 1 application  topically as needed.   magic mouthwash w/lidocaine Soln Take 5 mLs by mouth 4 (four) times daily as needed for  mouth pain.   metoprolol tartrate 50 MG tablet Commonly known as:  LOPRESSOR Take 1 tablet (50 mg total) by mouth 2 (two) times daily.   ondansetron 8 MG tablet Commonly known as:  ZOFRAN Take 1 tablet (8 mg total) by mouth 2 (two) times daily as needed for refractory nausea / vomiting. Start on day 3 after chemotherapy.   potassium chloride SA 20 MEQ tablet Commonly known as:  K-DUR,KLOR-CON Take 1 tablet (20 mEq total) by mouth 2 (two) times daily.   prochlorperazine 10 MG tablet Commonly known as:  COMPAZINE Take 1 tablet (10 mg total) by mouth every 6 (six) hours as needed (NAUSEA).   XARELTO 20 MG Tabs tablet Generic drug:  rivaroxaban Take 1 tablet by mouth daily with supper   zolpidem 5 MG tablet Commonly known as:  AMBIEN Take 1 tablet (5 mg total) by mouth at bedtime as needed for sleep.       SURGICAL HISTORY:  Past Surgical History:  Procedure Laterality Date  . BREAST LUMPECTOMY WITH RADIOACTIVE SEED AND SENTINEL LYMPH NODE BIOPSY Right 11/07/2015   Procedure: BREAST LUMPECTOMY WITH RADIOACTIVE SEED AND SENTINEL LYMPH NODE BIOPSY;  Surgeon: Stark Klein, MD;  Location: [facility];  Service: General;  Laterality: Right;  . BREAST SURGERY     '11- Left breast lumpectomy  . CESAREAN SECTION    . CHOLECYSTECTOMY     Laparoscopic 20 yrs ago  . COLON SURGERY  03-09-14   partial colectomy for cancer  . IR GENERIC HISTORICAL  07/29/2016   IR FLUORO GUIDE PORT INSERTION RIGHT 07/29/2016 Sandi Mariscal, MD [facility]  . IR GENERIC HISTORICAL  07/29/2016   IR US GUIDE VASC ACCESS RIGHT 07/29/2016 Sandi Mariscal, MD [facility]  . IR GENERIC HISTORICAL  07/29/2016   IR US GUIDE BX ASP/DRAIN 07/29/2016 [facility]  . PARTIAL COLECTOMY N/A 03/09/2014   Procedure: LAPAROSCOPIC ASSISTED PARTIAL COLECTOMY, splenic flexure mobilization;  Surgeon:  Leighton Ruff, MD;  Location: [facility];  Service: General;  Laterality: N/A;  . RADIOFREQUENCY ABLATION Right 08/24/2015   Procedure: MICROWAVE ABLATION RIGHT LIVER LOBE ;  Surgeon: Corrie Mckusick, DO;  Location: [facility];  Service: Anesthesiology;  Laterality: Right;    REVIEW OF SYSTEMS:  Constitutional: Denies fevers, chills or abnormal weight loss (+) fatigue, sweat, decreased appetite Eyes: Denies blurriness of vision Ears, nose, mouth, throat, and face: Denies mucositis or sore throat Respiratory: Denies cough, dyspnea or wheezes Cardiovascular: Denies palpitation, chest discomfort or lower extremity swelling Gastrointestinal:  Denies heartburn or change in bowel habits. (+) Nausea (+) vomiting  Skin: Denies abnormal skin rashes Musculoskeletal: (+) intermittent right-sided flank pain. Lymphatics: Denies new lymphadenopathy or easy bruising Neurological:Denies numbness, tingling or new weaknesses Musculoskeletal: (+) back and chestpain Behavioral/Psych: Mood is stable, no new changes  All other systems were reviewed with the patient and are negative.    PHYSICAL EXAMINATION:  ECOG PERFORMANCE STATUS: 2  Blood pressure (!) 141/92, pulse 81, temperature 98.5 F (36.9 C), temperature source Oral, resp. rate 18, height '5\' 6"'  (1.676 m), weight 266 lb 4.8 oz (120.8 kg), last menstrual period 01/23/2014, SpO2 100 %.  Vitals:   02/12/17 0826  BP: (!) 141/92  Pulse: 81  Resp: 18  Temp: 98.5 F (36.9 C)  TempSrc: Oral  SpO2: 100%  Weight: 266 lb 4.8 oz (120.8 kg)  Height: '5\' 6"'  (1.676 m)     GENERAL:alert, no distress and comfortable; well developed and obese. Easily mobile to exam table.  SKIN: skin color, texture, turgor are normal, no rashes or significant lesions EYES: normal, Conjunctiva are pink and non-injected, sclera clear OROPHARYNX:no exudate, no erythema and lips, buccal mucosa, and tongue normal  NECK: supple, thyroid normal size, non-tender, without nodularity LYMPH:   no palpable lymphadenopathy in the cervical, axillary or supraclavicular LUNGS: clear to auscultation with normal breathing effort, no wheezes or rhonchi HEART: regular rate & rhythm and no murmurs and no lower extremity edema ABDOMEN:abdomen soft, non-tender and normal bowel sounds; incision healing with signs of infection.  Mild tenderness at the  Right flank area, no abdominal tenderness. No local megaly Musculoskeletal:no cyanosis of digits and no clubbing NEURO: alert & oriented x 3 with fluent speech, no focal motor/sensory deficits   Labs:  CBC Latest Ref Rng & Units 02/12/2017 01/29/2017 01/08/2017  WBC 3.9 - 10.3 10e3/uL 7.0 6.6 6.5  Hemoglobin 11.6 - 15.9 g/dL 12.4 13.3 12.7  Hematocrit 34.8 - 46.6 % 37.9 40.0 37.6  Platelets 145 - 400 10e3/uL 238 285 253    CMP Latest Ref Rng & Units 02/12/2017 01/29/2017 01/08/2017  Glucose 70 - 140 mg/dl 138 105 107  BUN 7.0 - 26.0 mg/dL 10.4 11.9 10.2  Creatinine 0.6 - 1.1 mg/dL 0.8 0.8 0.8  Sodium 136 - 145 mEq/L 142 141 142  Potassium 3.5 - 5.1 mEq/L 3.1(L) 3.7 3.0(LL)  Chloride 101 - 111 mmol/L - - -  CO2 22 - 29 mEq/L '24 24 28  ' Calcium 8.4 - 10.4 mg/dL 9.3 10.0 9.7  Total Protein 6.4 - 8.3 g/dL 6.7 7.8 7.5  Total Bilirubin 0.20 - 1.20 mg/dL 0.39 0.50 0.64  Alkaline Phos 40 - 150 U/L 42 43 38(L)  AST 5 - 34 U/L 27 36(H) 30  ALT 0 - 55 U/L '24 25 24   ' CEA  01/18/2014: 0.8 03/01/2015: 1.3 01/31/2016: 4.8 05/28/2016: 48 07/31/2016: 173.7 08/28/16: 139 09/25/2016: 43 11/04/16: 9.94 12/18/2016: 6.99 01/29/17: 4.58   PATHOLOGY RESULT Diagnosis 01/02/2015 Liver, needle/core biopsy, right - POSITIVE FOR METASTATIC ADENOCARCINOMA. - SEE COMMENT.   Diagnosis 11/07/2015 1. Breast, lumpectomy, Right - INVASIVE DUCTAL CARCINOMA, GRADE 2, SPANNING 1 CM. - DUCTAL CARCINOMA IN SITU, LOW GRADE. - RESECTION MARGINS ARE NEGATIVE FOR INVASIVE CARCINOMA. - DUCTAL CARCINOMA IN SITU COMES TO WITHIN 0.3 CM OF THE POSTERIOR MARGIN. - BIOPSY SITE. - SEE  ONCOLOGY TABLE. 2. Lymph node, sentinel, biopsy, right axillary #1 - ONE OF ONE LYMPH NODES NEGATIVE FOR CARCINOMA (0/1). 3. Lymph node, sentinel, biopsy, right axillary #2 - ONE OF ONE LYMPH NODES NEGATIVE FOR CARCINOMA (0/1). 4. Lymph node, sentinel, biopsy, right axillary #3 - ONE OF ONE LYMPH NODES NEGATIVE FOR CARCINOMA (0/1).  ONCOTYPE DX: RS 23, which predicts a year risk of distant recurrence with tamoxifen alone 14%   Diagnosis 07/29/2016 Liver, needle/core biopsy, posterior of right lobe METASTATIC ADENOCARCINOMA, CONSISTENT WITH COLONIC PRIMARY.  RADIOLOGY STUDIES   PET 07/08/2016 IMPRESSION: 1. Large mass in the right lobe of the liver demonstrates diffuse hypermetabolism, compatible with recurrent metastatic disease in the liver. 2. Multiple small pulmonary nodules are very similar to the recent chest CT 06/17/2016. These do not demonstrate hypermetabolism on the PET images, however, these lesions are well below the level of PET imaging. Given that these nodules are new compared to prior chest CT 12/18/2015, they are concerning for potential metastatic disease to the lungs, and continued attention on followup studies is recommended. 3. No other findings of metastatic disease elsewhere in the neck, chest, abdomen or pelvis. 4.  There is some low-level hypermetabolic activity adjacent to the surgical clips in the lateral aspect of the right breast at the site of prior lumpectomy. This area has undergone interval involution over the several prior examinations, and this low-level activity is favored to be related to normal healing. Attention on follow-up imaging is recommended to ensure the stability or resolution of this finding. 5. Aortic atherosclerosis, in addition to 2 vessel coronary artery disease. Please note that although the presence of coronary artery calcium documents the presence of coronary artery disease, the severity of this disease and any potential  stenosis cannot be assessed on this non-gated CT examination. Assessment for potential risk factor modification, dietary therapy or pharmacologic therapy may be warranted, if clinically indicated. 6. Additional incidental findings, as above.  MM DIAG BREAST TOMO BILATERAL 10/17/2016 FINDINGS: There are bilateral lumpectomy changes in the upper central left breast and upper central right breast. No mass, nonsurgical distortion, or suspicious microcalcification is identified in either breast to suggest malignancy. Mammographic images were processed with CAD. IMPRESSION: No evidence of malignancy in either breast. Lumpectomy changes bilaterally. RECOMMENDATION: Diagnostic mammogram is suggested in 1 year. (Code:DM-B-01Y)  CT CHEST, ABDOMEN PELVIS W CONTRAST 10/24/16 IMPRESSION: 1. The numerous tiny bilateral pulmonary nodules seen on the previous imaging exams are no longer evident, suggesting response of therapy. 2. Large ill-defined irregular right hepatic lesion measures smaller on today's study. 3. Slight increase in size of hepatoduodenal ligament lymphadenopathy. 4. Abdominal Aortic Atherosclerois (ICD10-170.0)  CT CAP 01/05/17 IMPRESSION: 1. Left lower lobe pulmonary nodule and hepatic metastatic disease are stable. 2. Hepatoduodenal ligament lymph node is stable. 3. Aortic atherosclerosis (ICD10-170.0). Coronary artery calcification. 4. Enlarged pulmonary arteries, indicative of pulmonary arterial hypertension. 5. Small bilateral adrenal nodules are unchanged.   ASSESSMENT: Veronica Reyes 61 y.o. female   1. Colon Cancer, Stage I (pT2N0), MMR normal, oligo recurrent disease in liver in 12/2014, KRAS mutation (+), liver and lung recurrence in 06/2016 -I previously reviewed the nature history of metastatic colon cancer, and the potential curative approach with chemotherapy followed by liver lesion ablation or resection. However, more likely, this is not curable disease. -She  initially received 3 cycles of Capeox, does not want to proceed with fourth cycle due to the moderate side effects from previous chemotherapy treatment. She declined liver mass resection, and underwent liver ablation by interventional radiologist Dr. Earleen Newport in 08/2015. - I previously reviewed her restaging CT scan from 06/17/2016, which unfortunately showed new area of infiltrative disease as the previous ablated liver lesion, likely recurrence. She also developed several new pulmonary nodules, small, concerning for metastatic disease. 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 --She has started chemotherapy FOLFIRI and avastin, has been tolerating moderately well, we'll continue every 2 weeks. -Her CEA has significantly decreased since she started chemotherapy, likely responding to her good response to chemotherapy. -Repeat CT scan on 10/23/16 showed resolution of numerous bilateral pulmonary nodules, the right hepatic lesion is smaller, and slight decrease in size of hepatoduodenal ligament lymphadenopathy. No other new lesions  -I previously reviewed her restaging CT scan from 01/05/2017, which showed stable pulmonary and liver metastasis, no other new lesions. --I previously encouraged her to continue chemotherapy for at least 3 more months, then we will repeat a PET scan. She agrees, but really wants to come off chemo if possible and she is interested in liver targeted therapy if it's possible down the road. -she has been tolerating chemo poorly overall,  will decrease her chemo dose from next cycle -pt requests chemotherapy break 4 months, due to her birthday and multiple graduation ceremonies -Hold chemotherapy and restart on June 21st, per patient request  -Labs reviewed and her tumor marker is in the normal range and adequate to continue with treatment  -Plan to repeat scan in late July  2. Right breast cancer, pT1bN0M0, stage Ia,  ER+/PR+/HER2-, History of left Breast Cancer, diagnosed on 02/01/2015 --Left CIS in situ, s/p lumpectomy in 2006 and 2011 by Dr. Margot Chimes, and radiation in 2011 by Dr. Sondra Come.  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 Surgical margins were negative. -I previously reviewed her Oncotype DX result. Her recurrence score is 22, which predicts 14% 10 year risk of distant recurrence with tamoxifen alone. This is intermediate risk, based on her relatively low number, I do not recommend adjuvant chemotherapy. -She has completed adjuvant breast radiation -her [place] level supports she is postmenopausal -She has started adjuvant letrozole, tolerating well, we'll continue. -We previously discussed breast cancer surveillance, I strongly encouraged her to continue annual screening mammogram, self exam, and follow-up with [place] routinely. -her recent mammogram was normal 10/2016  3. History of PE (01/06/2014) --She has a diagnosis of pulmonary embolism, likely secondary to malignancy in the setting of family history for stroke (and possible stroke history in patient as well). Lower extremity dopplers negative.  -continue Xarelto. Due to her metastatic colon cancer, I previously recommended her to continue indefinitely.  4.  Uterine Fibroids  --Negative endometrial biopsy. Following with Gynecology.  5. Kidney lesion (Right). -No hypermetabolic right kidney lesion on the pet scan -Repeat abdominal MRI 05/29/2015 showed unchanged right renal lesion, favored to represent a complex/septated cyst.  6. Right flank pain - possible related to her liver metastasis -improved with chemo, seems to be worse lately  -She'll continue hydrocodone as needed, I encouraged her to take as needed to better control her pain. -She uses Hydrocortisone and would like a refill today   7.  Morbid obesity - I have previously encouraged her to eat  healthy and exercise more.  8. HTN  -The patient's blood pressure is slightly elevated today. Could be related to the Avastin. -She does not have a monitor at home. I have advised her to monitor at home. She will continue following up with her PCP  9. Nose bleeds - I have previously  discussed the patient her nosebleeds could be related to Avastin  -she knows to avoid blowing noses   Plan  -No Chemo today, she wants to take a chemotherapy break, restarted on June 21 -Lab, flush, f/u and chemo FOLFIRI and Avastin on 6/21 and 7/5 -We'll decrease irinotecan to 166m/m2, 5-fu 20038mm2 from next cycle chemo, due to poor tolerance  -Refill Hydrocortisone today      All questions were answered. The patient knows to call the clinic with any problems, questions or concerns. We can certainly see the patient much sooner if necessary.  I spent 20 minutes counseling the patient face to face. The total time spent in the appointment was 25 minutes.  This document serves as a record of services personally performed by YaTruitt MerleMD. It was created on her behalf by AmJoslyn Devona trained medical scribe. The creation of this record is based on the scribe's personal observations and the provider's statements to them. This document has been checked and approved by the attending provider.  I have reviewed the above documentation for accuracy and completeness, and I agree with the above information.      Veronica Reyes  02/12/2017

## 2017-02-12 ENCOUNTER — Telehealth: Payer: Self-pay | Admitting: Hematology

## 2017-02-12 ENCOUNTER — Ambulatory Visit: Payer: BLUE CROSS/BLUE SHIELD

## 2017-02-12 ENCOUNTER — Other Ambulatory Visit (HOSPITAL_BASED_OUTPATIENT_CLINIC_OR_DEPARTMENT_OTHER): Payer: BLUE CROSS/BLUE SHIELD

## 2017-02-12 ENCOUNTER — Ambulatory Visit (HOSPITAL_BASED_OUTPATIENT_CLINIC_OR_DEPARTMENT_OTHER): Payer: BLUE CROSS/BLUE SHIELD | Admitting: Hematology

## 2017-02-12 ENCOUNTER — Encounter: Payer: Self-pay | Admitting: Hematology

## 2017-02-12 VITALS — BP 141/92 | HR 81 | Temp 98.5°F | Resp 18 | Ht 66.0 in | Wt 266.3 lb

## 2017-02-12 DIAGNOSIS — Z170 Estrogen receptor positive status [ER+]: Secondary | ICD-10-CM | POA: Diagnosis not present

## 2017-02-12 DIAGNOSIS — C187 Malignant neoplasm of sigmoid colon: Secondary | ICD-10-CM | POA: Diagnosis not present

## 2017-02-12 DIAGNOSIS — C50511 Malignant neoplasm of lower-outer quadrant of right female breast: Secondary | ICD-10-CM | POA: Diagnosis not present

## 2017-02-12 DIAGNOSIS — I10 Essential (primary) hypertension: Secondary | ICD-10-CM | POA: Diagnosis not present

## 2017-02-12 DIAGNOSIS — C787 Secondary malignant neoplasm of liver and intrahepatic bile duct: Secondary | ICD-10-CM

## 2017-02-12 DIAGNOSIS — Z95828 Presence of other vascular implants and grafts: Secondary | ICD-10-CM

## 2017-02-12 LAB — COMPREHENSIVE METABOLIC PANEL
ALBUMIN: 2.9 g/dL — AB (ref 3.5–5.0)
ALK PHOS: 42 U/L (ref 40–150)
ALT: 24 U/L (ref 0–55)
ANION GAP: 13 meq/L — AB (ref 3–11)
AST: 27 U/L (ref 5–34)
BILIRUBIN TOTAL: 0.39 mg/dL (ref 0.20–1.20)
BUN: 10.4 mg/dL (ref 7.0–26.0)
CALCIUM: 9.3 mg/dL (ref 8.4–10.4)
CO2: 24 mEq/L (ref 22–29)
CREATININE: 0.8 mg/dL (ref 0.6–1.1)
Chloride: 105 mEq/L (ref 98–109)
Glucose: 138 mg/dl (ref 70–140)
Potassium: 3.1 mEq/L — ABNORMAL LOW (ref 3.5–5.1)
Sodium: 142 mEq/L (ref 136–145)
TOTAL PROTEIN: 6.7 g/dL (ref 6.4–8.3)

## 2017-02-12 LAB — CBC WITH DIFFERENTIAL/PLATELET
BASO%: 0.3 % (ref 0.0–2.0)
BASOS ABS: 0 10*3/uL (ref 0.0–0.1)
EOS ABS: 0.1 10*3/uL (ref 0.0–0.5)
EOS%: 1.3 % (ref 0.0–7.0)
HEMATOCRIT: 37.9 % (ref 34.8–46.6)
HEMOGLOBIN: 12.4 g/dL (ref 11.6–15.9)
LYMPH#: 2.6 10*3/uL (ref 0.9–3.3)
LYMPH%: 37.4 % (ref 14.0–49.7)
MCH: 31.8 pg (ref 25.1–34.0)
MCHC: 32.7 g/dL (ref 31.5–36.0)
MCV: 97.2 fL (ref 79.5–101.0)
MONO#: 0.6 10*3/uL (ref 0.1–0.9)
MONO%: 7.9 % (ref 0.0–14.0)
NEUT%: 53.1 % (ref 38.4–76.8)
NEUTROS ABS: 3.7 10*3/uL (ref 1.5–6.5)
Platelets: 238 10*3/uL (ref 145–400)
RBC: 3.9 10*6/uL (ref 3.70–5.45)
RDW: 16.2 % — AB (ref 11.2–14.5)
WBC: 7 10*3/uL (ref 3.9–10.3)

## 2017-02-12 MED ORDER — SODIUM CHLORIDE 0.9 % IJ SOLN
10.0000 mL | Freq: Once | INTRAMUSCULAR | Status: AC
  Administered 2017-02-12: 10 mL
  Filled 2017-02-12: qty 10

## 2017-02-12 MED ORDER — SODIUM CHLORIDE 0.9% FLUSH
10.0000 mL | INTRAVENOUS | Status: DC | PRN
  Administered 2017-02-12: 10 mL via INTRAVENOUS
  Filled 2017-02-12: qty 10

## 2017-02-12 MED ORDER — HYDROCODONE-ACETAMINOPHEN 5-325 MG PO TABS: 1.0000 | ORAL_TABLET | Freq: Three times a day (TID) | ORAL | 0 refills | Status: DC | PRN

## 2017-02-12 MED ORDER — HEPARIN SOD (PORK) LOCK FLUSH 100 UNIT/ML IV SOLN
500.0000 [IU] | Freq: Once | INTRAVENOUS | Status: AC
  Administered 2017-02-12: 500 [IU]
  Filled 2017-02-12: qty 5

## 2017-02-12 NOTE — Telephone Encounter (Signed)
Patient did not want AVS or calender - my chart active . Scheduled appt per 5/31 LOS - per YF okay to cancel all previous chemo appts .

## 2017-02-26 ENCOUNTER — Ambulatory Visit: Payer: BLUE CROSS/BLUE SHIELD

## 2017-02-26 ENCOUNTER — Other Ambulatory Visit: Payer: BLUE CROSS/BLUE SHIELD

## 2017-02-27 ENCOUNTER — Other Ambulatory Visit: Payer: Self-pay | Admitting: Hematology

## 2017-02-27 DIAGNOSIS — Z170 Estrogen receptor positive status [ER+]: Principal | ICD-10-CM

## 2017-02-27 DIAGNOSIS — C50511 Malignant neoplasm of lower-outer quadrant of right female breast: Secondary | ICD-10-CM

## 2017-03-04 ENCOUNTER — Other Ambulatory Visit (HOSPITAL_BASED_OUTPATIENT_CLINIC_OR_DEPARTMENT_OTHER): Payer: BLUE CROSS/BLUE SHIELD

## 2017-03-04 ENCOUNTER — Ambulatory Visit (HOSPITAL_BASED_OUTPATIENT_CLINIC_OR_DEPARTMENT_OTHER): Payer: BLUE CROSS/BLUE SHIELD | Admitting: Nurse Practitioner

## 2017-03-04 ENCOUNTER — Ambulatory Visit (HOSPITAL_BASED_OUTPATIENT_CLINIC_OR_DEPARTMENT_OTHER): Payer: BLUE CROSS/BLUE SHIELD

## 2017-03-04 DIAGNOSIS — C187 Malignant neoplasm of sigmoid colon: Secondary | ICD-10-CM | POA: Diagnosis not present

## 2017-03-04 DIAGNOSIS — C787 Secondary malignant neoplasm of liver and intrahepatic bile duct: Secondary | ICD-10-CM

## 2017-03-04 DIAGNOSIS — Z86711 Personal history of pulmonary embolism: Secondary | ICD-10-CM

## 2017-03-04 DIAGNOSIS — C50511 Malignant neoplasm of lower-outer quadrant of right female breast: Secondary | ICD-10-CM

## 2017-03-04 DIAGNOSIS — Z95828 Presence of other vascular implants and grafts: Secondary | ICD-10-CM

## 2017-03-04 DIAGNOSIS — Z79899 Other long term (current) drug therapy: Secondary | ICD-10-CM

## 2017-03-04 DIAGNOSIS — Z170 Estrogen receptor positive status [ER+]: Principal | ICD-10-CM

## 2017-03-04 LAB — CBC WITH DIFFERENTIAL/PLATELET
BASO%: 0.5 % (ref 0.0–2.0)
Basophils Absolute: 0 10*3/uL (ref 0.0–0.1)
EOS%: 0.7 % (ref 0.0–7.0)
Eosinophils Absolute: 0.1 10*3/uL (ref 0.0–0.5)
HEMATOCRIT: 39.4 % (ref 34.8–46.6)
HGB: 13.1 g/dL (ref 11.6–15.9)
LYMPH#: 3 10*3/uL (ref 0.9–3.3)
LYMPH%: 33.3 % (ref 14.0–49.7)
MCH: 31.5 pg (ref 25.1–34.0)
MCHC: 33.2 g/dL (ref 31.5–36.0)
MCV: 94.9 fL (ref 79.5–101.0)
MONO#: 0.7 10*3/uL (ref 0.1–0.9)
MONO%: 8.3 % (ref 0.0–14.0)
NEUT%: 57.2 % (ref 38.4–76.8)
NEUTROS ABS: 5.1 10*3/uL (ref 1.5–6.5)
PLATELETS: 303 10*3/uL (ref 145–400)
RBC: 4.15 10*6/uL (ref 3.70–5.45)
RDW: 17.3 % — ABNORMAL HIGH (ref 11.2–14.5)
WBC: 9 10*3/uL (ref 3.9–10.3)

## 2017-03-04 LAB — COMPREHENSIVE METABOLIC PANEL
ALT: 34 U/L (ref 0–55)
ANION GAP: 10 meq/L (ref 3–11)
AST: 56 U/L — AB (ref 5–34)
Albumin: 3.1 g/dL — ABNORMAL LOW (ref 3.5–5.0)
Alkaline Phosphatase: 47 U/L (ref 40–150)
BILIRUBIN TOTAL: 0.47 mg/dL (ref 0.20–1.20)
BUN: 8.7 mg/dL (ref 7.0–26.0)
CHLORIDE: 105 meq/L (ref 98–109)
CO2: 26 meq/L (ref 22–29)
Calcium: 10 mg/dL (ref 8.4–10.4)
Creatinine: 0.8 mg/dL (ref 0.6–1.1)
EGFR: >90 mL/min/{1.73_m2} (ref >90)
Glucose: 98 mg/dl (ref 70–140)
Potassium: 4 mEq/L (ref 3.5–5.1)
Sodium: 140 mEq/L (ref 136–145)
TOTAL PROTEIN: 7.7 g/dL (ref 6.4–8.3)

## 2017-03-04 LAB — CEA (IN HOUSE-CHCC): CEA (CHCC-In House): 6.09 ng/mL — ABNORMAL HIGH (ref 0.00–5.00)

## 2017-03-04 LAB — UA PROTEIN, DIPSTICK - CHCC: PROTEIN: NEGATIVE mg/dL

## 2017-03-04 MED ORDER — HEPARIN SOD (PORK) LOCK FLUSH 100 UNIT/ML IV SOLN
500.0000 [IU] | Freq: Once | INTRAVENOUS | Status: AC | PRN
  Administered 2017-03-04: 500 [IU] via INTRAVENOUS
  Filled 2017-03-04: qty 5

## 2017-03-04 MED ORDER — SODIUM CHLORIDE 0.9% FLUSH
10.0000 mL | INTRAVENOUS | Status: DC | PRN
  Administered 2017-03-04: 10 mL via INTRAVENOUS
  Filled 2017-03-04: qty 10

## 2017-03-04 MED ORDER — ACETAMINOPHEN-CODEINE #4 300-60 MG PO TABS: 1.0000 | ORAL_TABLET | Freq: Three times a day (TID) | ORAL | 0 refills | Status: DC | PRN

## 2017-03-04 NOTE — Patient Instructions (Signed)
Implanted Port Home Guide An implanted port is a type of central line that is placed under the skin. Central lines are used to provide IV access when treatment or nutrition needs to be given through a person's veins. Implanted ports are used for long-term IV access. An implanted port may be placed because:  You need IV medicine that would be irritating to the small veins in your hands or arms.  You need long-term IV medicines, such as antibiotics.  You need IV nutrition for a long period.  You need frequent blood draws for lab tests.  You need dialysis.  Implanted ports are usually placed in the chest area, but they can also be placed in the upper arm, the abdomen, or the leg. An implanted port has two main parts:  Reservoir. The reservoir is round and will appear as a small, raised area under your skin. The reservoir is the part where a needle is inserted to give medicines or draw blood.  Catheter. The catheter is a thin, flexible tube that extends from the reservoir. The catheter is placed into a large vein. Medicine that is inserted into the reservoir goes into the catheter and then into the vein.  How will I care for my incision site? Do not get the incision site wet. Bathe or shower as directed by your health care provider. How is my port accessed? Special steps must be taken to access the port:  Before the port is accessed, a numbing cream can be placed on the skin. This helps numb the skin over the port site.  Your health care provider uses a sterile technique to access the port. ? Your health care provider must put on a mask and sterile gloves. ? The skin over your port is cleaned carefully with an antiseptic and allowed to dry. ? The port is gently pinched between sterile gloves, and a needle is inserted into the port.  Only "non-coring" port needles should be used to access the port. Once the port is accessed, a blood return should be checked. This helps ensure that the port  is in the vein and is not clogged.  If your port needs to remain accessed for a constant infusion, a clear (transparent) bandage will be placed over the needle site. The bandage and needle will need to be changed every week, or as directed by your health care provider.  Keep the bandage covering the needle clean and dry. Do not get it wet. Follow your health care provider's instructions on how to take a shower or bath while the port is accessed.  If your port does not need to stay accessed, no bandage is needed over the port.  What is flushing? Flushing helps keep the port from getting clogged. Follow your health care provider's instructions on how and when to flush the port. Ports are usually flushed with saline solution or a medicine called heparin. The need for flushing will depend on how the port is used.  If the port is used for intermittent medicines or blood draws, the port will need to be flushed: ? After medicines have been given. ? After blood has been drawn. ? As part of routine maintenance.  If a constant infusion is running, the port may not need to be flushed.  How long will my port stay implanted? The port can stay in for as long as your health care provider thinks it is needed. When it is time for the port to come out, surgery will be   done to remove it. The procedure is similar to the one performed when the port was put in. When should I seek immediate medical care? When you have an implanted port, you should seek immediate medical care if:  You notice a bad smell coming from the incision site.  You have swelling, redness, or drainage at the incision site.  You have more swelling or pain at the port site or the surrounding area.  You have a fever that is not controlled with medicine.  This information is not intended to replace advice given to you by your health care provider. Make sure you discuss any questions you have with your health care provider. Document  Released: 09/01/2005 Document Revised: 02/07/2016 Document Reviewed: 05/09/2013 Elsevier Interactive Patient Education  2017 [employer]  

## 2017-03-04 NOTE — Progress Notes (Signed)
  [facility] OFFICE PROGRESS NOTE   Diagnosis:  Colon cancer  INTERVAL HISTORY:   Ms. Veronica Reyes returns as scheduled. She was last treated with FOLFIRI/Avastin 01/29/2017. At that time she requested a treatment break until 03/05/2017. She is seen today prior to resuming treatment. She feels well. She denies nausea. No mouth sores. No diarrhea. She has a good appetite. She has gained some weight. Energy level is better. She has intermittent right back pain. She takes one Tylenol No. 4 a day.  Objective:  Vital signs in last 24 hours:  Blood pressure (!) 141/96, pulse 69, temperature 98.4 F (36.9 C), temperature source Oral, resp. rate 18, height '5\' 6"'$  (1.676 m), weight 273 lb 6.4 oz (124 kg), last menstrual period 01/23/2014, SpO2 100 %.    HEENT: No thrush or ulcers. Resp: Lungs clear bilaterally. Cardio: Regular rate and rhythm. GI: Abdomen soft and nontender. No hepatomegaly. Vascular: No leg edema. Neuro: Alert and oriented.  Porta cath without erythema.  Lab Results:  Lab Results  Component Value Date   WBC 9.0 03/04/2017   HGB 13.1 03/04/2017   HCT 39.4 03/04/2017   MCV 94.9 03/04/2017   PLT 303 03/04/2017   NEUTROABS 5.1 03/04/2017    Imaging:  No results found.  Medications: I have reviewed the patient's current medications.  Assessment/Plan: 1. Colon Cancer, Stage I (pT2N0), MMR normal, oligo recurrent disease in liver in 12/2014, KRAS mutation (+), liver and lung recurrence in 06/2016; resume treatment with FOLFIRI/Avastin 03/05/2017 following a treatment break 2. Right breast cancer, pT1bN0M0, stage Ia, ER+/PR+/HER2-, History of left Breast Cancer, diagnosed on 02/01/2015 3. History of PE (01/06/2014)   Disposition: Ms. Veronica Reyes appears stable. She is agreeable to resuming treatment as planned 03/05/2017. She will return for a follow-up visit and the next cycle of FOLFIRI/Avastin on 03/19/2017. She will contact the office in the interim with any  problems.  She continues to have intermittent right flank pain. She was provided with a new prescription for Tylenol/codeine at today's visit.    Ned Card ANP/GNP-BC   03/04/2017  3:01 PM

## 2017-03-05 ENCOUNTER — Ambulatory Visit (HOSPITAL_BASED_OUTPATIENT_CLINIC_OR_DEPARTMENT_OTHER): Payer: BLUE CROSS/BLUE SHIELD

## 2017-03-05 VITALS — BP 130/74 | HR 76 | Temp 98.5°F | Resp 18

## 2017-03-05 DIAGNOSIS — Z5111 Encounter for antineoplastic chemotherapy: Secondary | ICD-10-CM | POA: Diagnosis not present

## 2017-03-05 DIAGNOSIS — C787 Secondary malignant neoplasm of liver and intrahepatic bile duct: Secondary | ICD-10-CM | POA: Diagnosis not present

## 2017-03-05 DIAGNOSIS — C187 Malignant neoplasm of sigmoid colon: Secondary | ICD-10-CM

## 2017-03-05 DIAGNOSIS — Z5112 Encounter for antineoplastic immunotherapy: Secondary | ICD-10-CM | POA: Diagnosis not present

## 2017-03-05 MED ORDER — SODIUM CHLORIDE 0.9% FLUSH
10.0000 mL | INTRAVENOUS | Status: DC | PRN
  Filled 2017-03-05: qty 10

## 2017-03-05 MED ORDER — PROCHLORPERAZINE MALEATE 10 MG PO TABS
ORAL_TABLET | ORAL | Status: AC
  Filled 2017-03-05: qty 1

## 2017-03-05 MED ORDER — DEXAMETHASONE SODIUM PHOSPHATE 10 MG/ML IJ SOLN
INTRAMUSCULAR | Status: AC
  Filled 2017-03-05: qty 1

## 2017-03-05 MED ORDER — IRINOTECAN HCL CHEMO INJECTION 100 MG/5ML
140.0000 mg/m2 | Freq: Once | INTRAVENOUS | Status: AC
  Administered 2017-03-05: 340 mg via INTRAVENOUS
  Filled 2017-03-05: qty 2

## 2017-03-05 MED ORDER — SODIUM CHLORIDE 0.9 % IV SOLN
4.7000 mg/kg | Freq: Once | INTRAVENOUS | Status: AC
  Administered 2017-03-05: 600 mg via INTRAVENOUS
  Filled 2017-03-05: qty 16

## 2017-03-05 MED ORDER — PROCHLORPERAZINE MALEATE 10 MG PO TABS
10.0000 mg | ORAL_TABLET | Freq: Once | ORAL | Status: AC
  Administered 2017-03-05: 10 mg via ORAL

## 2017-03-05 MED ORDER — SODIUM CHLORIDE 0.9 % IV SOLN
2000.0000 mg/m2 | INTRAVENOUS | Status: DC
  Administered 2017-03-05: 4900 mg via INTRAVENOUS
  Filled 2017-03-05: qty 98

## 2017-03-05 MED ORDER — PALONOSETRON HCL INJECTION 0.25 MG/5ML
0.2500 mg | Freq: Once | INTRAVENOUS | Status: AC
  Administered 2017-03-05: 0.25 mg via INTRAVENOUS

## 2017-03-05 MED ORDER — ATROPINE SULFATE 1 MG/ML IJ SOLN
INTRAMUSCULAR | Status: AC
  Filled 2017-03-05: qty 1

## 2017-03-05 MED ORDER — PALONOSETRON HCL INJECTION 0.25 MG/5ML
INTRAVENOUS | Status: AC
  Filled 2017-03-05: qty 5

## 2017-03-05 MED ORDER — DEXTROSE 5 % IV SOLN
400.0000 mg/m2 | Freq: Once | INTRAVENOUS | Status: AC
  Administered 2017-03-05: 976 mg via INTRAVENOUS
  Filled 2017-03-05: qty 48.8

## 2017-03-05 MED ORDER — ATROPINE SULFATE 1 MG/ML IJ SOLN
0.5000 mg | Freq: Once | INTRAMUSCULAR | Status: AC | PRN
  Administered 2017-03-05: 0.5 mg via INTRAVENOUS

## 2017-03-05 MED ORDER — DEXAMETHASONE SODIUM PHOSPHATE 10 MG/ML IJ SOLN
10.0000 mg | Freq: Once | INTRAMUSCULAR | Status: AC
  Administered 2017-03-05: 10 mg via INTRAVENOUS

## 2017-03-05 MED ORDER — SODIUM CHLORIDE 0.9 % IV SOLN
Freq: Once | INTRAVENOUS | Status: AC
  Administered 2017-03-05: 12:00:00 via INTRAVENOUS

## 2017-03-05 NOTE — Patient Instructions (Signed)
[facility] Discharge Instructions for Patients Receiving Chemotherapy  Today you received the following chemotherapy agents: Avastin, Irinotecan, Leucovorin and Adrucil   To help prevent nausea and vomiting after your treatment, we encourage you to take your nausea medication as directed.    If you develop nausea and vomiting that is not controlled by your nausea medication, call the clinic.   BELOW ARE SYMPTOMS THAT SHOULD BE REPORTED IMMEDIATELY:  *FEVER GREATER THAN 100.5 F  *CHILLS WITH OR WITHOUT FEVER  NAUSEA AND VOMITING THAT IS NOT CONTROLLED WITH YOUR NAUSEA MEDICATION  *UNUSUAL SHORTNESS OF BREATH  *UNUSUAL BRUISING OR BLEEDING  TENDERNESS IN MOUTH AND THROAT WITH OR WITHOUT PRESENCE OF ULCERS  *URINARY PROBLEMS  *BOWEL PROBLEMS  UNUSUAL RASH Items with * indicate a potential emergency and should be followed up as soon as possible.  Feel free to call the clinic you have any questions or concerns. The clinic phone number is [phone].  Please show the CHEMO ALERT CARD at check-in to the Emergency Department and triage nurse.   

## 2017-03-07 ENCOUNTER — Ambulatory Visit (HOSPITAL_BASED_OUTPATIENT_CLINIC_OR_DEPARTMENT_OTHER): Payer: Self-pay

## 2017-03-07 VITALS — BP 124/78 | HR 75 | Temp 98.5°F | Resp 18

## 2017-03-07 DIAGNOSIS — C187 Malignant neoplasm of sigmoid colon: Secondary | ICD-10-CM

## 2017-03-07 MED ORDER — HEPARIN SOD (PORK) LOCK FLUSH 100 UNIT/ML IV SOLN
500.0000 [IU] | Freq: Once | INTRAVENOUS | Status: AC | PRN
  Administered 2017-03-07: 500 [IU]
  Filled 2017-03-07: qty 5

## 2017-03-07 MED ORDER — SODIUM CHLORIDE 0.9% FLUSH
10.0000 mL | INTRAVENOUS | Status: DC | PRN
  Administered 2017-03-07: 10 mL
  Filled 2017-03-07: qty 10

## 2017-03-10 ENCOUNTER — Telehealth: Payer: Self-pay

## 2017-03-10 DIAGNOSIS — C187 Malignant neoplasm of sigmoid colon: Secondary | ICD-10-CM

## 2017-03-10 MED ORDER — POTASSIUM CHLORIDE CRYS ER 20 MEQ PO TBCR: 20.0000 meq | EXTENDED_RELEASE_TABLET | Freq: Two times a day (BID) | ORAL | 1 refills | Status: DC

## 2017-03-10 MED ORDER — RIVAROXABAN 20 MG PO TABS: 20.0000 mg | ORAL_TABLET | Freq: Every day | ORAL | 3 refills | Status: DC

## 2017-03-10 NOTE — Telephone Encounter (Signed)
Pt called for potassium and xarelto refills. Done.

## 2017-03-19 ENCOUNTER — Ambulatory Visit: Payer: BLUE CROSS/BLUE SHIELD

## 2017-03-19 ENCOUNTER — Telehealth: Payer: Self-pay | Admitting: Hematology

## 2017-03-19 ENCOUNTER — Ambulatory Visit (HOSPITAL_BASED_OUTPATIENT_CLINIC_OR_DEPARTMENT_OTHER): Payer: BLUE CROSS/BLUE SHIELD | Admitting: Nurse Practitioner

## 2017-03-19 ENCOUNTER — Other Ambulatory Visit (HOSPITAL_BASED_OUTPATIENT_CLINIC_OR_DEPARTMENT_OTHER): Payer: BLUE CROSS/BLUE SHIELD

## 2017-03-19 VITALS — BP 148/92 | HR 72 | Temp 98.4°F | Resp 18 | Ht 66.0 in | Wt 271.3 lb

## 2017-03-19 DIAGNOSIS — C187 Malignant neoplasm of sigmoid colon: Secondary | ICD-10-CM

## 2017-03-19 DIAGNOSIS — C50511 Malignant neoplasm of lower-outer quadrant of right female breast: Secondary | ICD-10-CM | POA: Diagnosis not present

## 2017-03-19 DIAGNOSIS — Z170 Estrogen receptor positive status [ER+]: Secondary | ICD-10-CM | POA: Diagnosis not present

## 2017-03-19 DIAGNOSIS — I10 Essential (primary) hypertension: Secondary | ICD-10-CM | POA: Diagnosis not present

## 2017-03-19 DIAGNOSIS — Z95828 Presence of other vascular implants and grafts: Secondary | ICD-10-CM

## 2017-03-19 LAB — CBC WITH DIFFERENTIAL/PLATELET
BASO%: 0.4 % (ref 0.0–2.0)
BASOS ABS: 0 10*3/uL (ref 0.0–0.1)
EOS%: 1.4 % (ref 0.0–7.0)
Eosinophils Absolute: 0.1 10*3/uL (ref 0.0–0.5)
HEMATOCRIT: 39.9 % (ref 34.8–46.6)
HGB: 13.1 g/dL (ref 11.6–15.9)
LYMPH%: 31.4 % (ref 14.0–49.7)
MCH: 31.5 pg (ref 25.1–34.0)
MCHC: 32.8 g/dL (ref 31.5–36.0)
MCV: 95.9 fL (ref 79.5–101.0)
MONO#: 0.4 10*3/uL (ref 0.1–0.9)
MONO%: 5.7 % (ref 0.0–14.0)
NEUT#: 4.5 10*3/uL (ref 1.5–6.5)
NEUT%: 61.1 % (ref 38.4–76.8)
NRBC: 0 % (ref 0–0)
PLATELETS: 237 10*3/uL (ref 145–400)
RBC: 4.16 10*6/uL (ref 3.70–5.45)
RDW: 16.3 % — AB (ref 11.2–14.5)
WBC: 7.4 10*3/uL (ref 3.9–10.3)
lymph#: 2.3 10*3/uL (ref 0.9–3.3)

## 2017-03-19 LAB — COMPREHENSIVE METABOLIC PANEL
ALK PHOS: 46 U/L (ref 40–150)
ALT: 40 U/L (ref 0–55)
ANION GAP: 10 meq/L (ref 3–11)
AST: 55 U/L — ABNORMAL HIGH (ref 5–34)
Albumin: 3.2 g/dL — ABNORMAL LOW (ref 3.5–5.0)
BUN: 8.8 mg/dL (ref 7.0–26.0)
CALCIUM: 9.7 mg/dL (ref 8.4–10.4)
CO2: 27 mEq/L (ref 22–29)
CREATININE: 0.8 mg/dL (ref 0.6–1.1)
Chloride: 105 mEq/L (ref 98–109)
Glucose: 107 mg/dl (ref 70–140)
Potassium: 3.8 mEq/L (ref 3.5–5.1)
Sodium: 142 mEq/L (ref 136–145)
TOTAL PROTEIN: 7.5 g/dL (ref 6.4–8.3)
Total Bilirubin: 0.58 mg/dL (ref 0.20–1.20)

## 2017-03-19 MED ORDER — SODIUM CHLORIDE 0.9% FLUSH
10.0000 mL | INTRAVENOUS | Status: DC | PRN
  Administered 2017-03-19: 10 mL via INTRAVENOUS
  Filled 2017-03-19: qty 10

## 2017-03-19 MED ORDER — HYDROCODONE-ACETAMINOPHEN 5-325 MG PO TABS: 1.0000 | ORAL_TABLET | Freq: Three times a day (TID) | ORAL | 0 refills | Status: DC | PRN

## 2017-03-19 MED ORDER — HEPARIN SOD (PORK) LOCK FLUSH 100 UNIT/ML IV SOLN
500.0000 [IU] | Freq: Once | INTRAVENOUS | Status: AC | PRN
  Administered 2017-03-19: 500 [IU] via INTRAVENOUS
  Filled 2017-03-19: qty 5

## 2017-03-19 NOTE — Progress Notes (Signed)
  [facility] OFFICE PROGRESS NOTE   Diagnosis:  Colon cancer  INTERVAL HISTORY:   Ms. Veronica Reyes returns as scheduled. She resumed treatment with FOLFIRI/Avastin 03/05/2017. She reports intermittent nausea/vomiting since chemotherapy. Compazine helps. She thinks she is beginning to feel better and has had no nausea/vomiting thus far today. She also had diarrhea but was able to control with an antidiarrheal medication. She notes that her mouth "hurts". She has not noticed any sores. She is tolerating fluids without difficulty. She continues to note blood with nose blowing. No other bleeding. She denies shortness of breath and chest pain. She does not want to take chemotherapy today. She continues to have back pain. She takes hydrocodone as needed.  Objective:  Vital signs in last 24 hours:  Blood pressure (!) 148/92, pulse 72, temperature 98.4 F (36.9 C), temperature source Oral, resp. rate 18, height _0  (1.676 m), weight 271 lb 4.8 oz (123.1 kg), last menstrual period 01/23/2014, SpO2 100 %.    HEENT: No thrush or ulcers. Mucous membranes appear moist. Resp: Lungs clear bilaterally. Cardio: Regular rate and rhythm. GI: No hepatomegaly. Vascular: No leg edema.  Porta cath without erythema.  Lab Results:  Lab Results  Component Value Date   WBC 7.4 03/19/2017   HGB 13.1 03/19/2017   HCT 39.9 03/19/2017   MCV 95.9 03/19/2017   PLT 237 03/19/2017   NEUTROABS 4.5 03/19/2017    Imaging:  No results found.  Medications: I have reviewed the patient's current medications.  Assessment/Plan: 1. Colon Cancer, Stage I (pT2N0), MMR normal, oligo recurrent disease in liver in 12/2014, KRAS mutation (+), liver and lung recurrence in 06/2016; Treatment resumed with FOLFIRI/Avastin 03/05/2017 following a treatment break 2. Right breast cancer, pT1bN0M0, stage Ia, ER+/PR+/HER2-, History of left Breast Cancer, diagnosed on 02/01/2015 3. History of PE  (01/06/2014)   Disposition: Ms. Veronica Reyes appears stable. She resumed treatment with FOLFIRI/Avastin 03/05/2017. Despite the dose reductions she continues to tolerate the chemotherapy poorly with nausea/vomiting, diarrhea and mouth soreness. She declines to take chemotherapy today. We discussed holding the irinotecan and proceeding with 5-FU and Avastin. She declines all treatment today. She has a family reunion in 2 weeks. She is agreeable to return in 3 weeks for a follow-up visit and possible treatment. She will contact the office in the interim with further problems.  Plan discussed with Dr. Burr Medico.  Ned Card ANP/GNP-BC   03/19/2017  11:13 AM

## 2017-03-19 NOTE — Telephone Encounter (Signed)
Appointments scheduled per 03/19/17 los. Patient refused copy of AVS report and appointment schedule.

## 2017-04-03 ENCOUNTER — Other Ambulatory Visit: Payer: Self-pay | Admitting: Hematology

## 2017-04-03 DIAGNOSIS — C187 Malignant neoplasm of sigmoid colon: Secondary | ICD-10-CM

## 2017-04-03 NOTE — Progress Notes (Signed)
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 Left breast DCIS diagnosed in 2006 and 2010, status post lumpectomy. 2. pT2N0M0 stage I colon cancer, s/p partial colectomy on 03/09/2014  3. PE in 12/2013 when she was diagnosed with colon cancer. 4. 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 [facility])   Staging form: Breast, AJCC 7th Edition     Clinical stage from 02/01/2015: Stage IA (T1a, N0, M0) - Signed by [name], MD on 06/04/2015     Pathologic stage from 11/10/2015: Stage IA (T1b, N0, M0) - Signed by [name], MD on 12/08/2015 Cancer of sigmoid colon [facility])   Staging form: Colon and Rectum, AJCC 7th Edition     Clinical: Stage I (T2, N0, M0) - Unsigned     Breast cancer of lower-outer quadrant of right female breast ([place])   2006 Cancer Diagnosis    Left breast DCIS diagnosed in 2006 and 2010, status post lumpectomy.      01/07/2014 Initial Diagnosis    Breast cancer      01/18/2015 Mammogram    87m mass in lower outer quadrant of right breast       02/01/2015 Initial Biopsy    (R) breast mass biopsy showed invasive ductal carcinoma & DCIS, grade 1-2.       02/01/2015 Receptors her2    ER 90%+, PR 10%+, Ki67 10%, HER2 negative (ratio 1.09)       11/07/2015 Surgery    (R) breast lumpectomy and sentinel lymph node biopsy ([name]      11/07/2015 Pathology Results    (R) breast lumpectomy showed invasive  ductal carcinoma, grade 2, 1 cm, low-grade DCIS, negative margins, 3 sentinel lymph nodes were negative. LVI (-). HER2 repeated and remains negative (ratio 1.29).        11/07/2015 Pathologic Stage    pT1b,pN0: Stage IA       11/07/2015 Oncotype testing    RS 22, which predicts a year risk of distant recurrence with tamoxifen alone 14% (intermediate-risk). Adjuvant chemo was not offered      02/21/2016 - 04/10/2016 Radiation Therapy    Adjuvant breast radiation. ([name]). Right breast: 50.4 Gy in 28 fractions.  Right breast boost: 12 Gy in 6 fractions.      05/2016 -  Anti-estrogen oral therapy    Femara (Letrozole) 2.5 mg daily.       10/17/2016 Mammogram    MM DIAG BREAST TOMO BILATERAL 10/17/2016 FINDINGS: There are bilateral lumpectomy changes in the upper central left breast and upper central right breast. No mass, nonsurgical distortion, or suspicious microcalcification is identified in either breast to suggest malignancy. Mammographic images were processed with CAD. IMPRESSION: No evidence of malignancy in either breast. Lumpectomy changes bilaterally. RECOMMENDATION: Diagnostic mammogram is suggested in 1 year. (Code:[id number]-B-01Y)       Cancer of sigmoid colon ([place]   03/04/2014 Initial Diagnosis    Cancer of sigmoid colon      03/09/2014 Pathology Results  G1 invasive adenocarcinoma, no lymph-Vascular invasion or Peri-neural invasion: Absent. MMR normal, MSI stable.       03/09/2014 Surgery    partial colectomy, negative margins.       11/10/2014 Relapse/Recurrence    biopsy confirmed oligo liver recurrence       11/10/2014 Imaging    PET showed two small ares of hypermetabolism in the right lobe of the liver, no other distant metastasis.       11/30/2014 Imaging    abdomen MRI showed a new enhancing lesion which corresponds to an area of abnormal hyper metabolism on recent PET-CT. Stable enlarged portal caval lymph node.       01/02/2015 Pathology Results    Liver  biopsy showed metastatic adenocarcinoma, IHC (+) for CK20, CDX-2, (-) for TTF-1 and ER      01/02/2015 Miscellaneous    liver biopsy KRAS mutation (+)       02/14/2015 Imaging    CT showed:  the small metastatic right hepatic lobe liver lesion is not identified for certain on this examination. No new lesions. Stable small cysts in segment 4 a.       03/05/2015 - 04/16/2015 Chemotherapy    CAPEOX (capecitabine 2500 mg twice daily Day 1-14, oxaliplatin 1 30 mg/m on day 1, every 21 days, S/P 3 cycles       08/24/2015 Procedure    CT-guided oligo liver metastasis microwave ablation by Dr. [name]      06/17/2016 Imaging    CT of the chest/abd/pelvis with contrast 1. Several new small pulmonary nodules worrisome for metastatic disease. 2. New large area of probable infiltrating recurrent tumor in the right hepatic lobe surrounding the prior ablation site. 3. Small supraclavicular lymph nodes.  Attention on future studies. 4. Stable surgical changes involving both breasts. No breast masses are identified. 5. Stable bilateral adrenal gland nodules. 6. Stable upper abdominal and right-sided retroperitoneal lymph nodes.      07/08/2016 PET scan    1. Large mass in the right lobe of the liver demonstrates diffuse hypermetabolism, compatible with recurrent metastatic disease in the liver 2. Multiple small pulmonary nodules are very similar to the recent Chest CT 06/17/16.  3. No other findings of metastatic disease elsewhere in the neck, chest, abdomen, or pelvis. 4. There is some low-level hypermetabolic activity adjacent to the surgical clips in the lateral aspect of the right breast at the site of prior lumpectomy.       07/29/2016 Pathology Results    Liver biopsy confirmed metastatic colon cancer       07/31/2016 -  Chemotherapy    FOLFIRI every 2 weeks, Avastin added from cycle 2       10/23/2016 Imaging    CT ABDOMEN PELVIS W CONTRAST 10/24/16 IMPRESSION: 1. The numerous tiny  bilateral pulmonary nodules seen on the previous imaging exams are no longer evident, suggesting response of therapy. 2. Large ill-defined irregular right hepatic lesion measures smaller on today's study. 3. Slight increase in size of hepatoduodenal ligament lymphadenopathy. 4. Abdominal Aortic Atherosclerois (ICD10-170.0)      01/05/2017 Imaging    CT CAP IMPRESSION: 1. Left lower lobe pulmonary nodule and hepatic metastatic disease are stable. 2. Hepatoduodenal ligament lymph node is stable. 3. Aortic atherosclerosis (ICD10-170.0). Coronary artery calcification. 4. Enlarged pulmonary arteries, indicative of pulmonary arterial hypertension. 5. Small bilateral adrenal nodules are unchanged.        CURRENT TREATMENT:   1. Letrozole 2.5 mg once daily, started in September 2017 2.  Chemotherapy FOLFIRI and Avastin (from cycle 2) every 2 weeks, started on 07/31/2016, multiple delay per pt's request; chemo break from 5/30-6/21/18; decreased irinotecan to 182m/m2, 5-fu 20048mm2 from 6/21, due to poor tolerance.   INTERIM HISTORY:  Veronica RENEGAR72.o. female with a history of recurrent left breast cancer, colon cancer s/p laparoscopic-assisted partial colectomy (03/09/2014), PE (01/06/2014), and right breast cancer (01/2015),  presents to clinic for follow up.   She present to the clinic today reporting she had a lot of trips and events and they are now done. She enjoyed herself. She says she feels good, but her nose is still running and her mouth stills. She would like to get tylenol #4 for her back pain. She says the pain goes to a 5/10 every day now. She takes it twice a day.  She has not taken her Amlodipine today so she will now. She is changing her PCP.   She needs to get her teeth pulled. She is not abel to wear her partials.   She has left shoulder pain and makes it harder to move arm. It resolves with pain medication. She does not have orthopedic surgeon.     MEDICAL  HISTORY: Past Medical History:  Diagnosis Date  . Anxiety   . Breast CA ([facility]   radiation and surgery-lt.  . Cancer ([place]   left breast cancer x2  . Hypertension   . Liver lesion   . Pulmonary embolism ([facility]   "blood clot in lungs" -dx. 4'15 with CT Chest. On Xarelto  . Radiation 01/22/10-03/12/10   left whole breast 4500 cGy, upper aspect boosted to 6120 cGy  . Radiation 02/21/16 - 04/10/16   right breast 50.4 Gy, right breast boost 12 Gy  . Shortness of breath    sob with exertion. dx. with Pulmonary emboli 4'15 ,tx. Xarelto,Lovenox used for [employer]   ALLERGIES:  is allergic to tramadol.  MEDICATIONS:  Allergies as of 04/09/2017      Reactions   Tramadol Other (See Comments)   Dizziness      Medication List       Accurate as of 04/09/17 10:28 PM. Always use your most recent med list.          acetaminophen-codeine 300-60 MG tablet Commonly known as:  TYLENOL #4 Take 1 tablet by mouth every 8 (eight) hours as needed. for pain   amLODipine 5 MG tablet Commonly known as:  NORVASC Take 1 tablet (5 mg total) by mouth daily.   dexamethasone 4 MG tablet Commonly known as:  DECADRON Take 1 tablet (4 mg total) by mouth daily.   ferrous sulfate 325 (65 FE) MG tablet Take 325 mg by mouth daily with breakfast. Pt takes  4 times per week.   hydrochlorothiazide 25 MG tablet Commonly known as:  HYDRODIURIL Take 1 tablet (25 mg total) by mouth every morning.   HYDROcodone-acetaminophen 5-325 MG tablet Commonly known as:  NORCO Take 1 tablet by mouth every 8 (eight) hours as needed for moderate pain.   letrozole 2.5 MG tablet Commonly known as:  FEMARA TAKE 1 TABLET BY MOUTH EVERY DAY   lidocaine-prilocaine cream Commonly known as:  EMLA Apply 1 application topically as needed.   magic mouthwash w/lidocaine Soln Take 5 mLs by mouth 4 (four) times daily as needed for mouth pain.   metoprolol tartrate 50 MG tablet Commonly known as:  LOPRESSOR Take 1 tablet (50 mg  total) by mouth 2 (two) times daily.  ondansetron 8 MG tablet Commonly known as:  ZOFRAN Take 1 tablet (8 mg total) by mouth 2 (two) times daily as needed for refractory nausea / vomiting. Start on day 3 after chemotherapy.   potassium chloride SA 20 MEQ tablet Commonly known as:  K-DUR,KLOR-CON TAKE 1 TABLET BY MOUTH 2 TIMES DAILY   prochlorperazine 10 MG tablet Commonly known as:  COMPAZINE Take 1 tablet (10 mg total) by mouth every 6 (six) hours as needed (NAUSEA).   rivaroxaban 20 MG Tabs tablet Commonly known as:  XARELTO Take 1 tablet (20 mg total) by mouth daily.   zolpidem 5 MG tablet Commonly known as:  AMBIEN Take 1 tablet (5 mg total) by mouth at bedtime as needed for sleep.       SURGICAL HISTORY:  Past Surgical History:  Procedure Laterality Date  . BREAST LUMPECTOMY WITH RADIOACTIVE SEED AND SENTINEL LYMPH NODE BIOPSY Right 11/07/2015   Procedure: BREAST LUMPECTOMY WITH RADIOACTIVE SEED AND SENTINEL LYMPH NODE BIOPSY;  Surgeon: Stark Klein, MD;  Location: [facility];  Service: General;  Laterality: Right;  . BREAST SURGERY     '11- Left breast lumpectomy  . CESAREAN SECTION    . CHOLECYSTECTOMY     Laparoscopic 20 yrs ago  . COLON SURGERY  03-09-14   partial colectomy for cancer  . IR GENERIC HISTORICAL  07/29/2016   IR FLUORO GUIDE PORT INSERTION RIGHT 07/29/2016 Sandi Mariscal, MD [facility]  . IR GENERIC HISTORICAL  07/29/2016   IR US GUIDE VASC ACCESS RIGHT 07/29/2016 Sandi Mariscal, MD [facility]  . IR GENERIC HISTORICAL  07/29/2016   IR US GUIDE BX ASP/DRAIN 07/29/2016 [facility]  . PARTIAL COLECTOMY N/A 03/09/2014   Procedure: LAPAROSCOPIC ASSISTED PARTIAL COLECTOMY, splenic flexure mobilization;  Surgeon: Leighton Ruff, MD;  Location[facility];  Service: General;  Laterality: N/A;  . RADIOFREQUENCY ABLATION Right 08/24/2015   Procedure: MICROWAVE ABLATION RIGHT LIVER LOBE ;  Surgeon: Corrie Mckusick, DO;  Location: [facility];  Service:  Anesthesiology;  Laterality: Right;    REVIEW OF SYSTEMS:  Constitutional: Denies fevers, chills or abnormal weight loss (+) fatigue, sweat, decreased appetite Eyes: Denies blurriness of vision Ears, nose, mouth, throat, and face: Denies mucositis or sore throat Respiratory: Denies cough, dyspnea or wheezes Cardiovascular: Denies palpitation, chest discomfort or lower extremity swelling Gastrointestinal:  Denies heartburn or change in bowel habits. (+) Nausea (+) vomiting  Skin: Denies abnormal skin rashes Musculoskeletal: (+) intermittent right-sided flank pain worsened. Lymphatics: Denies new lymphadenopathy or easy bruising Neurological:Denies numbness, tingling or new weaknesses Musculoskeletal: (+) back and chest pain (+) left shoulder pain.  Behavioral/Psych: Mood is stable, no new changes  All other systems were reviewed with the patient and are negative.    PHYSICAL EXAMINATION:  ECOG PERFORMANCE STATUS: 2  Blood pressure (!) 159/102, pulse 73, temperature 99.2 F (37.3 C), temperature source Oral, resp. rate 18, height '5\' 6"'  (1.676 m), weight 272 lb (123.4 kg), last menstrual period 01/23/2014, SpO2 100 %.  Vitals:   04/09/17 1118  BP: (!) 159/102  Pulse: 73  Resp: 18  Temp: 99.2 F (37.3 C)  TempSrc: Oral  SpO2: 100%  Weight: 272 lb (123.4 kg)  Height: '5\' 6"'  (1.676 m)     GENERAL:alert, no distress and comfortable; well developed and obese. Easily mobile to exam table.  SKIN: skin color, texture, turgor are normal, no rashes or significant lesions EYES: normal, Conjunctiva are pink and non-injected, sclera clear OROPHARYNX:no exudate, no erythema and lips,  buccal mucosa, and tongue normal  NECK: supple, thyroid normal size, non-tender, without nodularity LYMPH:  no palpable lymphadenopathy in the cervical, axillary or supraclavicular LUNGS: clear to auscultation with normal breathing effort, no wheezes or rhonchi HEART: regular rate & rhythm and no murmurs and  no lower extremity edema ABDOMEN:abdomen soft, non-tender and normal bowel sounds; incision healing with signs of infection.  Mild tenderness at the  Right flank area, no abdominal tenderness. No local megaly Musculoskeletal:no cyanosis of digits and no clubbing, limited ROM of left shoulder  NEURO: alert & oriented x 3 with fluent speech, no focal motor/sensory deficits   Labs:  CBC Latest Ref Rng & Units 04/09/2017 03/19/2017 03/04/2017  WBC 3.9 - 10.3 10e3/uL 8.7 7.4 9.0  Hemoglobin 11.6 - 15.9 g/dL 13.6 13.1 13.1  Hematocrit 34.8 - 46.6 % 41.4 39.9 39.4  Platelets 145 - 400 10e3/uL 233 237 303    CMP Latest Ref Rng & Units 04/09/2017 03/19/2017 03/04/2017  Glucose 70 - 140 mg/dl 96 107 98  BUN 7.0 - 26.0 mg/dL 9.4 8.8 8.7  Creatinine 0.6 - 1.1 mg/dL 0.8 0.8 0.8  Sodium 136 - 145 mEq/L 140 142 140  Potassium 3.5 - 5.1 mEq/L 3.8 3.8 4.0  Chloride 101 - 111 mmol/L - - -  CO2 22 - 29 mEq/L '25 27 26  ' Calcium 8.4 - 10.4 mg/dL 10.0 9.7 10.0  Total Protein 6.4 - 8.3 g/dL 8.0 7.5 7.7  Total Bilirubin 0.20 - 1.20 mg/dL 0.65 0.58 0.47  Alkaline Phos 40 - 150 U/L 47 46 47  AST 5 - 34 U/L 50(H) 55(H) 56(H)  ALT 0 - 55 U/L 36 40 34    CEA  01/18/2014: 0.8 03/01/2015: 1.3 01/31/2016: 4.8 05/28/2016: 48 07/31/2016: 173.7 08/28/16: 139 09/25/2016: 43 11/04/16: 9.94 12/18/2016: 6.99 01/29/17: 4.58 03/04/17: 6.09 04/09/17: PENDING    PATHOLOGY RESULT Diagnosis 01/02/2015 Liver, needle/core biopsy, right - POSITIVE FOR METASTATIC ADENOCARCINOMA. - SEE COMMENT.   Diagnosis 11/07/2015 1. Breast, lumpectomy, Right - INVASIVE DUCTAL CARCINOMA, GRADE 2, SPANNING 1 CM. - DUCTAL CARCINOMA IN SITU, LOW GRADE. - RESECTION MARGINS ARE NEGATIVE FOR INVASIVE CARCINOMA. - DUCTAL CARCINOMA IN SITU COMES TO WITHIN 0.3 CM OF THE POSTERIOR MARGIN. - BIOPSY SITE. - SEE ONCOLOGY TABLE. 2. Lymph node, sentinel, biopsy, right axillary #1 - ONE OF ONE LYMPH NODES NEGATIVE FOR CARCINOMA (0/1). 3. Lymph node,  sentinel, biopsy, right axillary #2 - ONE OF ONE LYMPH NODES NEGATIVE FOR CARCINOMA (0/1). 4. Lymph node, sentinel, biopsy, right axillary #3 - ONE OF ONE LYMPH NODES NEGATIVE FOR CARCINOMA (0/1).  ONCOTYPE DX: RS 14, which predicts a year risk of distant recurrence with tamoxifen alone 14%   Diagnosis 07/29/2016 Liver, needle/core biopsy, posterior of right lobe METASTATIC ADENOCARCINOMA, CONSISTENT WITH COLONIC PRIMARY.  RADIOLOGY STUDIES   PET 07/08/2016 IMPRESSION: 1. Large mass in the right lobe of the liver demonstrates diffuse hypermetabolism, compatible with recurrent metastatic disease in the liver. 2. Multiple small pulmonary nodules are very similar to the recent chest CT 06/17/2016. These do not demonstrate hypermetabolism on the PET images, however, these lesions are well below the level of PET imaging. Given that these nodules are new compared to prior chest CT 12/18/2015, they are concerning for potential metastatic disease to the lungs, and continued attention on followup studies is recommended. 3. No other findings of metastatic disease elsewhere in the neck, chest, abdomen or pelvis. 4. There is some low-level hypermetabolic activity adjacent to the surgical clips in the lateral aspect  of the right breast at the site of prior lumpectomy. This area has undergone interval involution over the several prior examinations, and this low-level activity is favored to be related to normal healing. Attention on follow-up imaging is recommended to ensure the stability or resolution of this finding. 5. Aortic atherosclerosis, in addition to 2 vessel coronary artery disease. Please note that although the presence of coronary artery calcium documents the presence of coronary artery disease, the severity of this disease and any potential stenosis cannot be assessed on this non-gated CT examination. Assessment for potential risk factor modification, dietary therapy or pharmacologic  therapy may be warranted, if clinically indicated. 6. Additional incidental findings, as above.  MM DIAG BREAST TOMO BILATERAL 10/17/2016 FINDINGS: There are bilateral lumpectomy changes in the upper central left breast and upper central right breast. No mass, nonsurgical distortion, or suspicious microcalcification is identified in either breast to suggest malignancy. Mammographic images were processed with CAD. IMPRESSION: No evidence of malignancy in either breast. Lumpectomy changes bilaterally. RECOMMENDATION: Diagnostic mammogram is suggested in 1 year. (Code:DM-B-01Y)  CT CHEST, ABDOMEN PELVIS W CONTRAST 10/24/16 IMPRESSION: 1. The numerous tiny bilateral pulmonary nodules seen on the previous imaging exams are no longer evident, suggesting response of therapy. 2. Large ill-defined irregular right hepatic lesion measures smaller on today's study. 3. Slight increase in size of hepatoduodenal ligament lymphadenopathy. 4. Abdominal Aortic Atherosclerois (ICD10-170.0)  CT CAP 01/05/17 IMPRESSION: 1. Left lower lobe pulmonary nodule and hepatic metastatic disease are stable. 2. Hepatoduodenal ligament lymph node is stable. 3. Aortic atherosclerosis (ICD10-170.0). Coronary artery calcification. 4. Enlarged pulmonary arteries, indicative of pulmonary arterial hypertension. 5. Small bilateral adrenal nodules are unchanged.   ASSESSMENT: Sharolyn Douglas 61 y.o. female   1. Colon Cancer, Stage I (pT2N0), MMR normal, oligo recurrent disease in liver in 12/2014, KRAS mutation (+), liver and lung recurrence in 06/2016 -I previously reviewed the nature history of metastatic colon cancer, and the potential curative approach with chemotherapy followed by liver lesion ablation or resection. However, more likely, this is not curable disease. -She initially received 3 cycles of Capeox, does not want to proceed with fourth cycle due to the moderate side effects from previous chemotherapy  treatment. She declined liver mass resection, and underwent liver ablation by interventional radiologist Dr. Earleen Newport in 08/2015. - I previously reviewed her restaging CT scan from 06/17/2016, which unfortunately showed new area of infiltrative disease as the previous ablated liver lesion, likely recurrence. She also developed several new pulmonary nodules, small, concerning for metastatic disease. 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 --She has started chemotherapy FOLFIRI and avastin, has been tolerating moderately well, we'll continue every 2 weeks. -Her CEA has significantly decreased since she started chemotherapy, likely responding to her good response to chemotherapy. -Repeat CT scan on 10/23/16 showed resolution of numerous bilateral pulmonary nodules, the right hepatic lesion is smaller, and slight decrease in size of hepatoduodenal ligament lymphadenopathy. No other new lesions  -I previously reviewed her restaging CT scan from 01/05/2017, which showed stable pulmonary and liver metastasis, no other new lesions. --I previously encouraged her to continue chemotherapy for at least 3 more months, then we will repeat a PET scan. She agrees, but really wants to come off chemo if possible and she is interested in liver targeted therapy if it's possible down the road. -she has been tolerating chemo poorly overall, will decrease her chemo dose from this cycle  -pt has had frequent chemotherapy break  lately, due to her birthday and multiple graduation ceremonies.  -Labs reviewed and her blood counts are overall normal.  -We will continue treatment for a couple of cycles before repeat staging scan.  -Proceed with cycle 13 today   2. Right breast cancer, pT1bN0M0, stage Ia, ER+/PR+/HER2-, History of left Breast Cancer, diagnosed on 02/01/2015 --Left CIS in situ, s/p lumpectomy in 2006 and 2011 by Dr. Margot Chimes, and radiation in 2011 by Dr.  Sondra Come.  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 Surgical margins were negative. -I previously reviewed her Oncotype DX result. Her recurrence score is 22, which predicts 14% 10 year risk of distant recurrence with tamoxifen alone. This is intermediate risk, based on her relatively low number, I do not recommend adjuvant chemotherapy. -She has completed adjuvant breast radiation -her Mason level supports she is postmenopausal -She has started adjuvant letrozole, tolerating well, we'll continue. -We previously discussed breast cancer surveillance, I strongly encouraged her to continue annual screening mammogram, self exam, and follow-up with [place] routinely. -her recent mammogram was normal 10/2016  3. History of PE (01/06/2014) --She has a diagnosis of pulmonary embolism, likely secondary to malignancy in the setting of family history for stroke (and possible stroke history in patient as well). Lower extremity dopplers negative.  -continue Xarelto. Due to her metastatic colon cancer, I previously recommended her to continue indefinitely.  4.  Uterine Fibroids  --Negative endometrial biopsy. Following with Gynecology.  5. Kidney lesion (Right). -No hypermetabolic right kidney lesion on the pet scan -Repeat abdominal MRI 05/29/2015 showed unchanged right renal lesion, favored to represent a complex/septated cyst.  6. Right flank pain - possible related to her liver metastasis -improved with chemo, seems to be worse lately  -She'll continue hydrocodone as needed, I encouraged her to take as needed to better control her pain. -She uses Hydrocortisone and would like a refill today -Will order Tylenol #4 today   7.  Morbid obesity - I have previously encouraged her to eat healthy and exercise more.  8. HTN  -The patient's blood pressure is slightly elevated today. Could be related to the Avastin. -She does  not have a monitor at home. I have advised her to monitor at home. She will continue following up with her PCP -Her HTN has been higher lately so I suggest she increase her dose. I will refill today while she changes her PCP. -Will repeat her BP in the infusion room  9. Nose bleeds - I have previously  discussed the patient her nosebleeds could be related to Avastin  -she knows to avoid blowing nose  10. Oral surgery -She wants to get her teeth pulled but wants to know when she can.  -She will follow up with dentist first.    Plan  -lab reviewed, will proceed cycle 13 FOLFIRI and Avastin today with decreased 5-fu and irinotecan dose  -Repeat BP in infusion room -Prescribe Tylenol #4 and refill amlodipine -Lab, flush, f/u and chemo FOLFIRI and Avastin in 2, 4 and 6 weeks     All questions were answered. The patient knows to call the clinic with any problems, questions or concerns. We can certainly see the patient much sooner if necessary.  I spent 20 minutes counseling the patient face to face. The total time spent in the appointment was 25 minutes.  This document serves as a record of services personally performed by [name], MD. It was created  on her behalf by Joslyn Devon, a trained medical scribe. The creation of this record is based on the scribe's personal observations and the provider's statements to them. This document has been checked and approved by the attending provider.   I have reviewed the above documentation for accuracy and completeness, and I agree with the above information.      [name]  04/09/2017

## 2017-04-09 ENCOUNTER — Ambulatory Visit: Payer: BLUE CROSS/BLUE SHIELD

## 2017-04-09 ENCOUNTER — Ambulatory Visit (HOSPITAL_BASED_OUTPATIENT_CLINIC_OR_DEPARTMENT_OTHER): Payer: BLUE CROSS/BLUE SHIELD

## 2017-04-09 ENCOUNTER — Ambulatory Visit (HOSPITAL_BASED_OUTPATIENT_CLINIC_OR_DEPARTMENT_OTHER): Payer: BLUE CROSS/BLUE SHIELD | Admitting: Hematology

## 2017-04-09 ENCOUNTER — Other Ambulatory Visit: Payer: BLUE CROSS/BLUE SHIELD

## 2017-04-09 ENCOUNTER — Encounter: Payer: Self-pay | Admitting: Hematology

## 2017-04-09 ENCOUNTER — Other Ambulatory Visit (HOSPITAL_BASED_OUTPATIENT_CLINIC_OR_DEPARTMENT_OTHER): Payer: BLUE CROSS/BLUE SHIELD

## 2017-04-09 VITALS — BP 159/102 | HR 73 | Temp 99.2°F | Resp 18 | Ht 66.0 in | Wt 272.0 lb

## 2017-04-09 VITALS — BP 152/90

## 2017-04-09 DIAGNOSIS — Z5112 Encounter for antineoplastic immunotherapy: Secondary | ICD-10-CM | POA: Diagnosis not present

## 2017-04-09 DIAGNOSIS — Z170 Estrogen receptor positive status [ER+]: Secondary | ICD-10-CM

## 2017-04-09 DIAGNOSIS — Z86718 Personal history of other venous thrombosis and embolism: Secondary | ICD-10-CM

## 2017-04-09 DIAGNOSIS — C50511 Malignant neoplasm of lower-outer quadrant of right female breast: Secondary | ICD-10-CM

## 2017-04-09 DIAGNOSIS — C187 Malignant neoplasm of sigmoid colon: Secondary | ICD-10-CM | POA: Diagnosis not present

## 2017-04-09 DIAGNOSIS — I10 Essential (primary) hypertension: Secondary | ICD-10-CM | POA: Diagnosis not present

## 2017-04-09 DIAGNOSIS — C787 Secondary malignant neoplasm of liver and intrahepatic bile duct: Secondary | ICD-10-CM | POA: Diagnosis not present

## 2017-04-09 DIAGNOSIS — Z5111 Encounter for antineoplastic chemotherapy: Secondary | ICD-10-CM | POA: Diagnosis not present

## 2017-04-09 LAB — COMPREHENSIVE METABOLIC PANEL
ALK PHOS: 47 U/L (ref 40–150)
ALT: 36 U/L (ref 0–55)
ANION GAP: 10 meq/L (ref 3–11)
AST: 50 U/L — ABNORMAL HIGH (ref 5–34)
Albumin: 3.4 g/dL — ABNORMAL LOW (ref 3.5–5.0)
BILIRUBIN TOTAL: 0.65 mg/dL (ref 0.20–1.20)
BUN: 9.4 mg/dL (ref 7.0–26.0)
CALCIUM: 10 mg/dL (ref 8.4–10.4)
CO2: 25 mEq/L (ref 22–29)
CREATININE: 0.8 mg/dL (ref 0.6–1.1)
Chloride: 106 mEq/L (ref 98–109)
EGFR: >90 mL/min/{1.73_m2} (ref >90)
Glucose: 96 mg/dl (ref 70–140)
Potassium: 3.8 mEq/L (ref 3.5–5.1)
Sodium: 140 mEq/L (ref 136–145)
Total Protein: 8 g/dL (ref 6.4–8.3)

## 2017-04-09 LAB — CBC WITH DIFFERENTIAL/PLATELET
BASO%: 0.3 % (ref 0.0–2.0)
Basophils Absolute: 0 10*3/uL (ref 0.0–0.1)
EOS ABS: 0.1 10*3/uL (ref 0.0–0.5)
EOS%: 0.6 % (ref 0.0–7.0)
HEMATOCRIT: 41.4 % (ref 34.8–46.6)
HGB: 13.6 g/dL (ref 11.6–15.9)
LYMPH%: 31.8 % (ref 14.0–49.7)
MCH: 31.2 pg (ref 25.1–34.0)
MCHC: 32.9 g/dL (ref 31.5–36.0)
MCV: 95 fL (ref 79.5–101.0)
MONO#: 0.8 10*3/uL (ref 0.1–0.9)
MONO%: 9 % (ref 0.0–14.0)
NEUT#: 5.1 10*3/uL (ref 1.5–6.5)
NEUT%: 58.3 % (ref 38.4–76.8)
PLATELETS: 233 10*3/uL (ref 145–400)
RBC: 4.36 10*6/uL (ref 3.70–5.45)
RDW: 15.2 % — ABNORMAL HIGH (ref 11.2–14.5)
WBC: 8.7 10*3/uL (ref 3.9–10.3)
lymph#: 2.8 10*3/uL (ref 0.9–3.3)
nRBC: 0 % (ref 0–0)

## 2017-04-09 LAB — UA PROTEIN, DIPSTICK - CHCC: Protein, ur: NEGATIVE mg/dL

## 2017-04-09 LAB — CEA (IN HOUSE-CHCC): CEA (CHCC-IN HOUSE): 4.22 ng/mL (ref 0.00–5.00)

## 2017-04-09 MED ORDER — PROCHLORPERAZINE MALEATE 10 MG PO TABS
ORAL_TABLET | ORAL | Status: AC
  Filled 2017-04-09: qty 1

## 2017-04-09 MED ORDER — DEXAMETHASONE SODIUM PHOSPHATE 10 MG/ML IJ SOLN
INTRAMUSCULAR | Status: AC
  Filled 2017-04-09: qty 1

## 2017-04-09 MED ORDER — BEVACIZUMAB CHEMO INJECTION 400 MG/16ML
600.0000 mg | Freq: Once | INTRAVENOUS | Status: AC
  Administered 2017-04-09: 600 mg via INTRAVENOUS
  Filled 2017-04-09: qty 16

## 2017-04-09 MED ORDER — METOPROLOL TARTRATE 50 MG PO TABS: 50.0000 mg | ORAL_TABLET | Freq: Two times a day (BID) | ORAL | 0 refills | Status: DC

## 2017-04-09 MED ORDER — IRINOTECAN HCL CHEMO INJECTION 100 MG/5ML
140.0000 mg/m2 | Freq: Once | INTRAVENOUS | Status: AC
  Administered 2017-04-09: 340 mg via INTRAVENOUS
  Filled 2017-04-09: qty 15

## 2017-04-09 MED ORDER — PALONOSETRON HCL INJECTION 0.25 MG/5ML
INTRAVENOUS | Status: AC
  Filled 2017-04-09: qty 5

## 2017-04-09 MED ORDER — ACETAMINOPHEN-CODEINE #4 300-60 MG PO TABS: 1.0000 | ORAL_TABLET | Freq: Three times a day (TID) | ORAL | 0 refills | Status: DC | PRN

## 2017-04-09 MED ORDER — ATROPINE SULFATE 1 MG/ML IJ SOLN
0.5000 mg | Freq: Once | INTRAMUSCULAR | Status: AC | PRN
  Administered 2017-04-09: 0.5 mg via INTRAVENOUS

## 2017-04-09 MED ORDER — PALONOSETRON HCL INJECTION 0.25 MG/5ML
0.2500 mg | Freq: Once | INTRAVENOUS | Status: AC
  Administered 2017-04-09: 0.25 mg via INTRAVENOUS

## 2017-04-09 MED ORDER — LEUCOVORIN CALCIUM INJECTION 350 MG
400.0000 mg/m2 | Freq: Once | INTRAVENOUS | Status: AC
  Administered 2017-04-09: 976 mg via INTRAVENOUS
  Filled 2017-04-09: qty 48.8

## 2017-04-09 MED ORDER — DEXAMETHASONE SODIUM PHOSPHATE 10 MG/ML IJ SOLN
10.0000 mg | Freq: Once | INTRAMUSCULAR | Status: AC
  Administered 2017-04-09: 10 mg via INTRAVENOUS

## 2017-04-09 MED ORDER — AMLODIPINE BESYLATE 5 MG PO TABS: 5.0000 mg | ORAL_TABLET | Freq: Every day | ORAL | 0 refills | Status: DC

## 2017-04-09 MED ORDER — HYDROCHLOROTHIAZIDE 25 MG PO TABS: 25.0000 mg | ORAL_TABLET | ORAL | 0 refills | Status: DC

## 2017-04-09 MED ORDER — PROCHLORPERAZINE MALEATE 10 MG PO TABS
10.0000 mg | ORAL_TABLET | Freq: Once | ORAL | Status: AC
  Administered 2017-04-09: 10 mg via ORAL

## 2017-04-09 MED ORDER — ATROPINE SULFATE 1 MG/ML IJ SOLN
INTRAMUSCULAR | Status: AC
  Filled 2017-04-09: qty 1

## 2017-04-09 MED ORDER — SODIUM CHLORIDE 0.9 % IV SOLN
Freq: Once | INTRAVENOUS | Status: AC
  Administered 2017-04-09: 12:00:00 via INTRAVENOUS

## 2017-04-09 MED ORDER — SODIUM CHLORIDE 0.9 % IV SOLN
2000.0000 mg/m2 | INTRAVENOUS | Status: DC
  Administered 2017-04-09: 4900 mg via INTRAVENOUS
  Filled 2017-04-09: qty 98

## 2017-04-09 NOTE — Patient Instructions (Signed)
Implanted Port Home Guide An implanted port is a type of central line that is placed under the skin. Central lines are used to provide IV access when treatment or nutrition needs to be given through a person's veins. Implanted ports are used for long-term IV access. An implanted port may be placed because:  You need IV medicine that would be irritating to the small veins in your hands or arms.  You need long-term IV medicines, such as antibiotics.  You need IV nutrition for a long period.  You need frequent blood draws for lab tests.  You need dialysis.  Implanted ports are usually placed in the chest area, but they can also be placed in the upper arm, the abdomen, or the leg. An implanted port has two main parts:  Reservoir. The reservoir is round and will appear as a small, raised area under your skin. The reservoir is the part where a needle is inserted to give medicines or draw blood.  Catheter. The catheter is a thin, flexible tube that extends from the reservoir. The catheter is placed into a large vein. Medicine that is inserted into the reservoir goes into the catheter and then into the vein.  How will I care for my incision site? Do not get the incision site wet. Bathe or shower as directed by your health care provider. How is my port accessed? Special steps must be taken to access the port:  Before the port is accessed, a numbing cream can be placed on the skin. This helps numb the skin over the port site.  Your health care provider uses a sterile technique to access the port. ? Your health care provider must put on a mask and sterile gloves. ? The skin over your port is cleaned carefully with an antiseptic and allowed to dry. ? The port is gently pinched between sterile gloves, and a needle is inserted into the port.  Only "non-coring" port needles should be used to access the port. Once the port is accessed, a blood return should be checked. This helps ensure that the port  is in the vein and is not clogged.  If your port needs to remain accessed for a constant infusion, a clear (transparent) bandage will be placed over the needle site. The bandage and needle will need to be changed every week, or as directed by your health care provider.  Keep the bandage covering the needle clean and dry. Do not get it wet. Follow your health care provider's instructions on how to take a shower or bath while the port is accessed.  If your port does not need to stay accessed, no bandage is needed over the port.  What is flushing? Flushing helps keep the port from getting clogged. Follow your health care provider's instructions on how and when to flush the port. Ports are usually flushed with saline solution or a medicine called heparin. The need for flushing will depend on how the port is used.  If the port is used for intermittent medicines or blood draws, the port will need to be flushed: ? After medicines have been given. ? After blood has been drawn. ? As part of routine maintenance.  If a constant infusion is running, the port may not need to be flushed.  How long will my port stay implanted? The port can stay in for as long as your health care provider thinks it is needed. When it is time for the port to come out, surgery will be   done to remove it. The procedure is similar to the one performed when the port was put in. When should I seek immediate medical care? When you have an implanted port, you should seek immediate medical care if:  You notice a bad smell coming from the incision site.  You have swelling, redness, or drainage at the incision site.  You have more swelling or pain at the port site or the surrounding area.  You have a fever that is not controlled with medicine.  This information is not intended to replace advice given to you by your health care provider. Make sure you discuss any questions you have with your health care provider. Document  Released: 09/01/2005 Document Revised: 02/07/2016 Document Reviewed: 05/09/2013 Elsevier Interactive Patient Education  2017 [employer]  

## 2017-04-09 NOTE — Progress Notes (Signed)
Per Thu, desk RN, Ok to treat with elevated BP. Patient did not take BP meds until office visit today. Will recheck after pre-meds are administered.  Wylene Simmer, BSN, RN 04/09/2017 12:26 PM

## 2017-04-09 NOTE — Patient Instructions (Signed)
[facility] Discharge Instructions for Patients Receiving Chemotherapy  Today you received the following chemotherapy agents: Avastin, Irinotecan, Leucovorin and Adrucil   To help prevent nausea and vomiting after your treatment, we encourage you to take your nausea medication as directed.    If you develop nausea and vomiting that is not controlled by your nausea medication, call the clinic.   BELOW ARE SYMPTOMS THAT SHOULD BE REPORTED IMMEDIATELY:  *FEVER GREATER THAN 100.5 F  *CHILLS WITH OR WITHOUT FEVER  NAUSEA AND VOMITING THAT IS NOT CONTROLLED WITH YOUR NAUSEA MEDICATION  *UNUSUAL SHORTNESS OF BREATH  *UNUSUAL BRUISING OR BLEEDING  TENDERNESS IN MOUTH AND THROAT WITH OR WITHOUT PRESENCE OF ULCERS  *URINARY PROBLEMS  *BOWEL PROBLEMS  UNUSUAL RASH Items with * indicate a potential emergency and should be followed up as soon as possible.  Feel free to call the clinic you have any questions or concerns. The clinic phone number is [phone].  Please show the CHEMO ALERT CARD at check-in to the Emergency Department and triage nurse.   

## 2017-04-11 ENCOUNTER — Ambulatory Visit (HOSPITAL_BASED_OUTPATIENT_CLINIC_OR_DEPARTMENT_OTHER): Payer: Self-pay

## 2017-04-11 VITALS — BP 164/93 | HR 79 | Temp 99.6°F | Resp 18

## 2017-04-11 DIAGNOSIS — C187 Malignant neoplasm of sigmoid colon: Secondary | ICD-10-CM

## 2017-04-11 MED ORDER — HEPARIN SOD (PORK) LOCK FLUSH 100 UNIT/ML IV SOLN
500.0000 [IU] | Freq: Once | INTRAVENOUS | Status: AC | PRN
  Administered 2017-04-11: 500 [IU]
  Filled 2017-04-11: qty 5

## 2017-04-11 MED ORDER — SODIUM CHLORIDE 0.9% FLUSH
10.0000 mL | INTRAVENOUS | Status: DC | PRN
  Administered 2017-04-11: 10 mL
  Filled 2017-04-11: qty 10

## 2017-04-20 NOTE — Progress Notes (Signed)
[facility] PROGRESS NOTE DATE OF VISIT: 04/23/2017   [name], MD 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 Left breast DCIS diagnosed in 2006 and 2010, status post lumpectomy. 2. pT2N0M0 stage I colon cancer, s/p partial colectomy on 03/09/2014  3. PE in 12/2013 when she was diagnosed with colon cancer. 4. 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 [facility])   Staging form: Breast, AJCC 7th Edition     Clinical stage from 02/01/2015: Stage IA (T1a, N0, M0) - Signed by [name], MD on 06/04/2015     Pathologic stage from 11/10/2015: Stage IA (T1b, N0, M0) - Signed by [name], MD on 12/08/2015 Cancer of sigmoid colon [facility])   Staging form: Colon and Rectum, AJCC 7th Edition     Clinical: Stage I (T2, N0, M0) - Unsigned     Breast cancer of lower-outer quadrant of right female breast ([place])   2006 Cancer Diagnosis    Left breast DCIS diagnosed in 2006 and 2010, status post lumpectomy.      01/07/2014 Initial Diagnosis    Breast cancer      01/18/2015 Mammogram    110m mass in lower outer quadrant of right breast       02/01/2015 Initial Biopsy    (R) breast mass biopsy showed invasive ductal carcinoma & DCIS, grade 1-2.       02/01/2015 Receptors her2    ER 90%+, PR 10%+, Ki67 10%, HER2 negative (ratio 1.09)       11/07/2015 Surgery    (R) breast lumpectomy and sentinel lymph node biopsy ([name]      11/07/2015 Pathology Results    (R) breast lumpectomy showed invasive ductal  carcinoma, grade 2, 1 cm, low-grade DCIS, negative margins, 3 sentinel lymph nodes were negative. LVI (-). HER2 repeated and remains negative (ratio 1.29).        11/07/2015 Pathologic Stage    pT1b,pN0: Stage IA       11/07/2015 Oncotype testing    RS 22, which predicts a year risk of distant recurrence with tamoxifen alone 14% (intermediate-risk). Adjuvant chemo was not offered      02/21/2016 - 04/10/2016 Radiation Therapy    Adjuvant breast radiation. ([name]). Right breast: 50.4 Gy in 28 fractions.  Right breast boost: 12 Gy in 6 fractions.      05/2016 -  Anti-estrogen oral therapy    Femara (Letrozole) 2.5 mg daily.       10/17/2016 Mammogram    MM DIAG BREAST TOMO BILATERAL 10/17/2016 FINDINGS: There are bilateral lumpectomy changes in the upper central left breast and upper central right breast. No mass, nonsurgical distortion, or suspicious microcalcification is identified in either breast to suggest malignancy. Mammographic images were processed with CAD. IMPRESSION: No evidence of malignancy in either breast. Lumpectomy changes bilaterally. RECOMMENDATION: Diagnostic mammogram is suggested in 1 year. (Code:DM-B-01Y)       Cancer of sigmoid colon ([place]   03/04/2014 Initial Diagnosis    Cancer of sigmoid colon      03/09/2014 Pathology  Results    G1 invasive adenocarcinoma, no lymph-Vascular invasion or Peri-neural invasion: Absent. MMR normal, MSI stable.       03/09/2014 Surgery    partial colectomy, negative margins.       11/10/2014 Relapse/Recurrence    biopsy confirmed oligo liver recurrence       11/10/2014 Imaging    PET showed two small ares of hypermetabolism in the right lobe of the liver, no other distant metastasis.       11/30/2014 Imaging    abdomen MRI showed a new enhancing lesion which corresponds to an area of abnormal hyper metabolism on recent PET-CT. Stable enlarged portal caval lymph node.       01/02/2015 Pathology Results    Liver biopsy  showed metastatic adenocarcinoma, IHC (+) for CK20, CDX-2, (-) for TTF-1 and ER      01/02/2015 Miscellaneous    liver biopsy KRAS mutation (+)       02/14/2015 Imaging    CT showed:  the small metastatic right hepatic lobe liver lesion is not identified for certain on this examination. No new lesions. Stable small cysts in segment 4 a.       03/05/2015 - 04/16/2015 Chemotherapy    CAPEOX (capecitabine 2500 mg twice daily Day 1-14, oxaliplatin 1 30 mg/m on day 1, every 21 days, S/P 3 cycles       08/24/2015 Procedure    CT-guided oligo liver metastasis microwave ablation by Dr. [name]      06/17/2016 Imaging    CT of the chest/abd/pelvis with contrast 1. Several new small pulmonary nodules worrisome for metastatic disease. 2. New large area of probable infiltrating recurrent tumor in the right hepatic lobe surrounding the prior ablation site. 3. Small supraclavicular lymph nodes.  Attention on future studies. 4. Stable surgical changes involving both breasts. No breast masses are identified. 5. Stable bilateral adrenal gland nodules. 6. Stable upper abdominal and right-sided retroperitoneal lymph nodes.      07/08/2016 PET scan    1. Large mass in the right lobe of the liver demonstrates diffuse hypermetabolism, compatible with recurrent metastatic disease in the liver 2. Multiple small pulmonary nodules are very similar to the recent Chest CT 06/17/16.  3. No other findings of metastatic disease elsewhere in the neck, chest, abdomen, or pelvis. 4. There is some low-level hypermetabolic activity adjacent to the surgical clips in the lateral aspect of the right breast at the site of prior lumpectomy.       07/29/2016 Pathology Results    Liver biopsy confirmed metastatic colon cancer       07/31/2016 -  Chemotherapy    FOLFIRI every 2 weeks, Avastin added from cycle 2       10/23/2016 Imaging    CT ABDOMEN PELVIS W CONTRAST 10/24/16 IMPRESSION: 1. The numerous tiny bilateral  pulmonary nodules seen on the previous imaging exams are no longer evident, suggesting response of therapy. 2. Large ill-defined irregular right hepatic lesion measures smaller on today's study. 3. Slight increase in size of hepatoduodenal ligament lymphadenopathy. 4. Abdominal Aortic Atherosclerois (ICD10-170.0)      01/05/2017 Imaging    CT CAP IMPRESSION: 1. Left lower lobe pulmonary nodule and hepatic metastatic disease are stable. 2. Hepatoduodenal ligament lymph node is stable. 3. Aortic atherosclerosis (ICD10-170.0). Coronary artery calcification. 4. Enlarged pulmonary arteries, indicative of pulmonary arterial hypertension. 5. Small bilateral adrenal nodules are unchanged.        CURRENT TREATMENT:   1. Letrozole 2.5 mg once daily, started  in September 2017 2. Chemotherapy FOLFIRI and Avastin (from cycle 2) every 2 weeks, started on 07/31/2016, multiple delay per pt's request; chemo break from 5/30-6/21/18; decreased irinotecan to 173m/m2, 5-fu 20047mm2 from 6/21, due to poor tolerance.   INTERIM HISTORY:  Veronica [age]OBBS69.o. female with a history of recurrent left breast cancer, colon cancer s/p laparoscopic-assisted partial colectomy (03/09/2014), PE (01/06/2014), and right breast cancer (01/2015),  presents to clinic for follow up.   She had nausea and vomiting during her chemotherapy last time, and she was given Compazine. She also had multiple episodes of nausea and vomiting at home after chemotherapy. She has been having sensory mouth since then, no mouth sore. Appetite is slightly low, able to function at home. She just came back from a trip this morning. She feels moderately fatigued. She has mildpain when she blows her nose. No fever or chills no other complaints.  MEDICAL HISTORY: Past Medical History:  Diagnosis Date  . Anxiety   . Breast CA ([place]   radiation and surgery-lt.  . Cancer ([place]   left breast cancer x2  . Hypertension   . Liver lesion    . [place] ([place]   "blood clot in lungs" -dx. 4'15 with CT Chest. On Xarelto  . Radiation 01/22/10-03/12/10   left whole breast 4500 cGy, upper aspect boosted to 6120 cGy  . Radiation 02/21/16 - 04/10/16   right breast 50.4 Gy, right breast boost 12 Gy  . Shortness of breath    sob with exertion. dx. with Pulmonary emboli 4'15 ,tx. Xarelto,Lovenox used for [employer]   ALLERGIES:  is allergic to tramadol.  MEDICATIONS:  Allergies as of 04/23/2017      Reactions   Tramadol Other (See Comments)   Dizziness      Medication List       Accurate as of 04/23/17  2:02 PM. Always use your most recent med list.          acetaminophen-codeine 300-60 MG tablet Commonly known as:  TYLENOL #4 Take 1 tablet by mouth every 8 (eight) hours as needed. for pain   amLODipine 5 MG tablet Commonly known as:  NORVASC Take 1 tablet (5 mg total) by mouth daily.   dexamethasone 4 MG tablet Commonly known as:  DECADRON Take 1 tablet (4 mg total) by mouth daily.   ferrous sulfate 325 (65 FE) MG tablet Take 325 mg by mouth daily with breakfast. Pt takes  4 times per week.   hydrochlorothiazide 25 MG tablet Commonly known as:  HYDRODIURIL Take 1 tablet (25 mg total) by mouth every morning.   HYDROcodone-acetaminophen 5-325 MG tablet Commonly known as:  NORCO Take 1 tablet by mouth every 8 (eight) hours as needed for moderate pain.   letrozole 2.5 MG tablet Commonly known as:  FEMARA TAKE 1 TABLET BY MOUTH EVERY DAY   lidocaine-prilocaine cream Commonly known as:  EMLA Apply 1 application topically as needed.   magic mouthwash w/lidocaine Soln Take 5 mLs by mouth 4 (four) times daily as needed for mouth pain.   metoprolol tartrate 50 MG tablet Commonly known as:  LOPRESSOR Take 1 tablet (50 mg total) by mouth 2 (two) times daily.   ondansetron 8 MG tablet Commonly known as:  ZOFRAN Take 1 tablet (8 mg total) by mouth 2 (two) times daily as needed for refractory nausea /  vomiting. Start on day 3 after chemotherapy.   potassium chloride SA 20 MEQ tablet Commonly known as:  K-DUR,KLOR-CON TAKE 1 TABLET BY MOUTH 2 TIMES DAILY   prochlorperazine 10 MG tablet Commonly known as:  COMPAZINE Take 1 tablet (10 mg total) by mouth every 6 (six) hours as needed (NAUSEA).   promethazine 25 MG tablet Commonly known as:  PHENERGAN Take 1 tablet (25 mg total) by mouth every 6 (six) hours as needed for nausea or vomiting.   rivaroxaban 20 MG Tabs tablet Commonly known as:  XARELTO Take 1 tablet (20 mg total) by mouth daily.   zolpidem 5 MG tablet Commonly known as:  AMBIEN Take 1 tablet (5 mg total) by mouth at bedtime as needed for sleep.       SURGICAL HISTORY:  Past Surgical History:  Procedure Laterality Date  . BREAST LUMPECTOMY WITH RADIOACTIVE SEED AND SENTINEL LYMPH NODE BIOPSY Right 11/07/2015   Procedure: BREAST LUMPECTOMY WITH RADIOACTIVE SEED AND SENTINEL LYMPH NODE BIOPSY;  Surgeon: Stark Klein, MD;  Location: [facility];  Service: General;  Laterality: Right;  . BREAST SURGERY     '11- Left breast lumpectomy  . CESAREAN SECTION    . CHOLECYSTECTOMY     Laparoscopic 20 yrs ago  . COLON SURGERY  03-09-14   partial colectomy for cancer  . IR GENERIC HISTORICAL  07/29/2016   IR FLUORO GUIDE PORT INSERTION RIGHT 07/29/2016 Sandi Mariscal, MD [facility]  . IR GENERIC HISTORICAL  07/29/2016   IR US GUIDE VASC ACCESS RIGHT 07/29/2016 Sandi Mariscal, MD [facility]  . IR GENERIC HISTORICAL  07/29/2016   IR US GUIDE BX ASP/DRAIN 07/29/2016 [facility]  . PARTIAL COLECTOMY N/A 03/09/2014   Procedure: LAPAROSCOPIC ASSISTED PARTIAL COLECTOMY, splenic flexure mobilization;  Surgeon: Leighton Ruff, MD;  Location[facility];  Service: General;  Laterality: N/A;  . RADIOFREQUENCY ABLATION Right 08/24/2015   Procedure: MICROWAVE ABLATION RIGHT LIVER LOBE ;  Surgeon: Corrie Mckusick, DO;  Location: [facility];  Service: Anesthesiology;  Laterality: Right;     REVIEW OF SYSTEMS:  Constitutional: Denies fevers, chills or abnormal weight loss (+) fatigue, sweat, decreased appetite Eyes: Denies blurriness of vision Ears, nose, mouth, throat, and face: Denies mucositis or sore throat Respiratory: Denies cough, dyspnea or wheezes Cardiovascular: Denies palpitation, chest discomfort or lower extremity swelling Gastrointestinal:  Denies heartburn or change in bowel habits. (+) Nausea (+) vomiting  Skin: Denies abnormal skin rashes Musculoskeletal: (+) intermittent right-sided flank pain worsened. Lymphatics: Denies new lymphadenopathy or easy bruising Neurological:Denies numbness, tingling or new weaknesses Musculoskeletal: (+) back and chest pain (+) left shoulder pain.  Behavioral/Psych: Mood is stable, no new changes  All other systems were reviewed with the patient and are negative.    PHYSICAL EXAMINATION:  ECOG PERFORMANCE STATUS: 2  Last menstrual period 01/23/2014.  There were no vitals filed for this visit.   GENERAL:alert, no distress and comfortable; well developed and obese. Easily mobile to exam table.  SKIN: skin color, texture, turgor are normal, no rashes or significant lesions EYES: normal, Conjunctiva are pink and non-injected, sclera clear OROPHARYNX:no exudate, no erythema and lips, buccal mucosa, and tongue normal  NECK: supple, thyroid normal size, non-tender, without nodularity LYMPH:  no palpable lymphadenopathy in the cervical, axillary or supraclavicular LUNGS: clear to auscultation with normal breathing effort, no wheezes or rhonchi HEART: regular rate & rhythm and no murmurs and no lower extremity edema ABDOMEN:abdomen soft, non-tender and normal bowel sounds; incision healing with signs of infection.  Mild tenderness at the  Right flank area, no abdominal tenderness. No local megaly Musculoskeletal:no cyanosis  of digits and no clubbing, limited ROM of left shoulder  NEURO: alert & oriented x 3 with fluent  speech, no focal motor/sensory deficits   Labs:  CBC Latest Ref Rng & Units 04/23/2017 04/09/2017 03/19/2017  WBC 3.9 - 10.3 10e3/uL 7.1 8.7 7.4  Hemoglobin 11.6 - 15.9 g/dL 12.5 13.6 13.1  Hematocrit 34.8 - 46.6 % 37.7 41.4 39.9  Platelets 145 - 400 10e3/uL 248 233 237    CMP Latest Ref Rng & Units 04/23/2017 04/09/2017 03/19/2017  Glucose 70 - 140 mg/dl 94 96 107  BUN 7.0 - 26.0 mg/dL 7.4 9.4 8.8  Creatinine 0.6 - 1.1 mg/dL 0.8 0.8 0.8  Sodium 136 - 145 mEq/L 141 140 142  Potassium 3.5 - 5.1 mEq/L 3.5 3.8 3.8  Chloride 101 - 111 mmol/L - - -  CO2 22 - 29 mEq/L '28 25 27  ' Calcium 8.4 - 10.4 mg/dL 9.6 10.0 9.7  Total Protein 6.4 - 8.3 g/dL 7.0 8.0 7.5  Total Bilirubin 0.20 - 1.20 mg/dL 0.59 0.65 0.58  Alkaline Phos 40 - 150 U/L 44 47 46  AST 5 - 34 U/L 39(H) 50(H) 55(H)  ALT 0 - 55 U/L 31 36 40    CEA  01/18/2014: 0.8 03/01/2015: 1.3 01/31/2016: 4.8 05/28/2016: 48 07/31/2016: 173.7 08/28/16: 139 09/25/2016: 43 11/04/16: 9.94 12/18/2016: 6.99 01/29/17: 4.58 03/04/17: 6.09 04/09/17: 4.22   PATHOLOGY RESULT Diagnosis 01/02/2015 Liver, needle/core biopsy, right - POSITIVE FOR METASTATIC ADENOCARCINOMA. - SEE COMMENT.   Diagnosis 11/07/2015 1. Breast, lumpectomy, Right - INVASIVE DUCTAL CARCINOMA, GRADE 2, SPANNING 1 CM. - DUCTAL CARCINOMA IN SITU, LOW GRADE. - RESECTION MARGINS ARE NEGATIVE FOR INVASIVE CARCINOMA. - DUCTAL CARCINOMA IN SITU COMES TO WITHIN 0.3 CM OF THE POSTERIOR MARGIN. - BIOPSY SITE. - SEE ONCOLOGY TABLE. 2. Lymph node, sentinel, biopsy, right axillary #1 - ONE OF ONE LYMPH NODES NEGATIVE FOR CARCINOMA (0/1). 3. Lymph node, sentinel, biopsy, right axillary #2 - ONE OF ONE LYMPH NODES NEGATIVE FOR CARCINOMA (0/1). 4. Lymph node, sentinel, biopsy, right axillary #3 - ONE OF ONE LYMPH NODES NEGATIVE FOR CARCINOMA (0/1).  ONCOTYPE DX: RS 63, which predicts a year risk of distant recurrence with tamoxifen alone 14%   Diagnosis 07/29/2016 Liver, needle/core biopsy,  posterior of right lobe METASTATIC ADENOCARCINOMA, CONSISTENT WITH COLONIC PRIMARY.  RADIOLOGY STUDIES   PET 07/08/2016 IMPRESSION: 1. Large mass in the right lobe of the liver demonstrates diffuse hypermetabolism, compatible with recurrent metastatic disease in the liver. 2. Multiple small pulmonary nodules are very similar to the recent chest CT 06/17/2016. These do not demonstrate hypermetabolism on the PET images, however, these lesions are well below the level of PET imaging. Given that these nodules are new compared to prior chest CT 12/18/2015, they are concerning for potential metastatic disease to the lungs, and continued attention on followup studies is recommended. 3. No other findings of metastatic disease elsewhere in the neck, chest, abdomen or pelvis. 4. There is some low-level hypermetabolic activity adjacent to the surgical clips in the lateral aspect of the right breast at the site of prior lumpectomy. This area has undergone interval involution over the several prior examinations, and this low-level activity is favored to be related to normal healing. Attention on follow-up imaging is recommended to ensure the stability or resolution of this finding. 5. Aortic atherosclerosis, in addition to 2 vessel coronary artery disease. Please note that although the presence of coronary artery calcium documents the presence of coronary artery disease, the severity of this disease  and any potential stenosis cannot be assessed on this non-gated CT examination. Assessment for potential risk factor modification, dietary therapy or pharmacologic therapy may be warranted, if clinically indicated. 6. Additional incidental findings, as above.  MM DIAG BREAST TOMO BILATERAL 10/17/2016 FINDINGS: There are bilateral lumpectomy changes in the upper central left breast and upper central right breast. No mass, nonsurgical distortion, or suspicious microcalcification is identified in either  breast to suggest malignancy. Mammographic images were processed with CAD. IMPRESSION: No evidence of malignancy in either breast. Lumpectomy changes bilaterally. RECOMMENDATION: Diagnostic mammogram is suggested in 1 year. (Code:DM-B-01Y)  CT CHEST, ABDOMEN PELVIS W CONTRAST 10/24/16 IMPRESSION: 1. The numerous tiny bilateral pulmonary nodules seen on the previous imaging exams are no longer evident, suggesting response of therapy. 2. Large ill-defined irregular right hepatic lesion measures smaller on today's study. 3. Slight increase in size of hepatoduodenal ligament lymphadenopathy. 4. Abdominal Aortic Atherosclerois (ICD10-170.0)  CT CAP 01/05/17 IMPRESSION: 1. Left lower lobe pulmonary nodule and hepatic metastatic disease are stable. 2. Hepatoduodenal ligament lymph node is stable. 3. Aortic atherosclerosis (ICD10-170.0). Coronary artery calcification. 4. Enlarged pulmonary arteries, indicative of pulmonary arterial hypertension. 5. Small bilateral adrenal nodules are unchanged.   ASSESSMENT: Sharolyn Douglas 60 y.o. female   1. Colon Cancer, Stage I (pT2N0), MMR normal, oligo recurrent disease in liver in 12/2014, KRAS mutation (+), liver and lung recurrence in 06/2016 -I previously reviewed the nature history of metastatic colon cancer, and the potential curative approach with chemotherapy followed by liver lesion ablation or resection. However, more likely, this is not curable disease. -She initially received 3 cycles of [place], does not want to proceed with fourth cycle due to the moderate side effects from previous chemotherapy treatment. She declined liver mass resection, and underwent liver ablation by interventional radiologist Dr. Earleen Newport in 08/2015. - I previously reviewed her restaging CT scan from 06/17/2016, which unfortunately showed new area of infiltrative disease as the previous ablated liver lesion, likely recurrence. She also developed several new pulmonary  nodules, small, concerning for metastatic disease. 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 --She has started chemotherapy FOLFIRI and avastin, has been tolerating moderately well, we'll continue every 2 weeks. -Her CEA has significantly decreased since she started chemotherapy, likely responding to her good response to chemotherapy. -Repeat CT scan on 10/23/16 showed resolution of numerous bilateral pulmonary nodules, the right hepatic lesion is smaller, and slight decrease in size of hepatoduodenal ligament lymphadenopathy. No other new lesions  -I previously reviewed her restaging CT scan from 01/05/2017, which showed stable pulmonary and liver metastasis, no other new lesions. -she has been tolerating chemo poorly overall, I have decreased her chemo dose a few times  -Labs reviewed and her blood counts are overall normal, we'll continue chemotherapy today and every 2 weeks -Due to her worsening nausea, I'll add IV Emend before chemotherapy today, and I called in Phenergan 25 mg as needed for her nausea at home. She uses Compazine also. She doesn't respond well to Zofran. -Plan to risk repeat staging CT scan before next visit.  2. Right breast cancer, pT1bN0M0, stage Ia, ER+/PR+/HER2-, History of left Breast Cancer, diagnosed on 02/01/2015 --Left CIS in situ, s/p lumpectomy in 2006 and 2011 by Dr. Margot Chimes, and radiation in 2011 by Dr. Sondra Come.  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 Surgical margins were negative. -I previously reviewed her Oncotype  DX result. Her recurrence score is 22, which predicts 14% 10 year risk of distant recurrence with tamoxifen alone. This is intermediate risk, based on her relatively low number, I do not recommend adjuvant chemotherapy. -She has completed adjuvant breast radiation -her [place] level supports she is  postmenopausal -She has started adjuvant letrozole, tolerating well, we'll continue. -We previously discussed breast cancer surveillance, I strongly encouraged her to continue annual screening mammogram, self exam, and follow-up with [place] routinely. -her recent mammogram was normal 10/2016  3. History of PE (01/06/2014) --She has a diagnosis of pulmonary embolism, likely secondary to malignancy in the setting of family history for stroke (and possible stroke history in patient as well). Lower extremity dopplers negative.  -continue Xarelto. Due to her metastatic colon cancer, I previously recommended her to continue indefinitely.  4.  Uterine Fibroids  --Negative endometrial biopsy. Following with Gynecology.  5. Kidney lesion (Right). -No hypermetabolic right kidney lesion on the pet scan -Repeat abdominal MRI 05/29/2015 showed unchanged right renal lesion, favored to represent a complex/septated cyst.  6. Right flank pain - possible related to her liver metastasis -improved with chemo, seems to be worse lately  -She'll continue hydrocodone as needed, I encouraged her to take as needed to better control her pain. -She uses Hydrocortisone and would like a refill today -Will order Tylenol #4 today   7.  Morbid obesity - I have previously encouraged her to eat healthy and exercise more.  8. HTN  -The patient's blood pressure is slightly elevated today. Could be related to the Avastin. -She does not have a monitor at home. I have advised her to monitor at home. She will continue following up with her PCP -Her HTN has been higher lately so I suggest she increase her dose. I will refill today while she changes her PCP. -Will repeat her BP in the infusion room  9. Nose bleeds - I have previously  discussed the patient her nosebleeds could be related to Avastin  -she knows to avoid blowing nose  10. Oral surgery -She wants to get her teeth pulled but wants to know when she can.  -She will  follow up with dentist first.    Plan  -lab reviewed, will proceed cycle 14 FOLFIRI and Avastin today and continue every 2 weeks. -We'll add on IV Emend before chemotherapy, I also called in Phenergan 25 mg for her to use at home as needed for nausea -Refill her hydrocodone -Repeat a CT scan in 1 week -Follow up in 2 weeks with lab and chemotherapy.   All questions were answered. The patient knows to call the clinic with any problems, questions or concerns. We can certainly see the patient much sooner if necessary.  I spent 20 minutes counseling the patient face to face. The total time spent in the appointment was 25 minutes.  This document serves as a record of services personally performed by [name], MD. It was created on her behalf by Brandt Loosen, a trained medical scribe. The creation of this record is based on the scribe's personal observations and the provider's statements to them. This document has been checked and approved by the attending provider.   I have reviewed the above documentation for accuracy and completeness, and I agree with the above information.      [name]  04/23/2017

## 2017-04-21 ENCOUNTER — Telehealth: Payer: Self-pay | Admitting: *Deleted

## 2017-04-21 ENCOUNTER — Telehealth: Payer: Self-pay | Admitting: Hematology

## 2017-04-21 NOTE — Telephone Encounter (Signed)
Spoke with pt and was informed that pt is currently out of town for family funeral.  Stated she will not be back to [place] until  10 am on Thursday  04/23/17.   Pt would like to change her appts to later time. Message sent to scheduler, and Dr. Burr Medico. Pt's   Phone     [phone].

## 2017-04-21 NOTE — Telephone Encounter (Signed)
Please make sure to reschedule all her future appointments, thanks.   [name] MD

## 2017-04-21 NOTE — Telephone Encounter (Signed)
sw pt husband to confirm 8/9 appt at 1115 per sch msg

## 2017-04-23 ENCOUNTER — Encounter: Payer: Self-pay | Admitting: Hematology

## 2017-04-23 ENCOUNTER — Ambulatory Visit (HOSPITAL_BASED_OUTPATIENT_CLINIC_OR_DEPARTMENT_OTHER): Payer: BLUE CROSS/BLUE SHIELD | Admitting: Hematology

## 2017-04-23 ENCOUNTER — Ambulatory Visit (HOSPITAL_BASED_OUTPATIENT_CLINIC_OR_DEPARTMENT_OTHER): Payer: BLUE CROSS/BLUE SHIELD

## 2017-04-23 ENCOUNTER — Other Ambulatory Visit: Payer: BLUE CROSS/BLUE SHIELD

## 2017-04-23 VITALS — BP 129/91 | HR 60 | Temp 98.0°F | Resp 17

## 2017-04-23 DIAGNOSIS — C787 Secondary malignant neoplasm of liver and intrahepatic bile duct: Secondary | ICD-10-CM

## 2017-04-23 DIAGNOSIS — Z5112 Encounter for antineoplastic immunotherapy: Secondary | ICD-10-CM

## 2017-04-23 DIAGNOSIS — Z170 Estrogen receptor positive status [ER+]: Secondary | ICD-10-CM | POA: Diagnosis not present

## 2017-04-23 DIAGNOSIS — Z5111 Encounter for antineoplastic chemotherapy: Secondary | ICD-10-CM | POA: Diagnosis not present

## 2017-04-23 DIAGNOSIS — C187 Malignant neoplasm of sigmoid colon: Secondary | ICD-10-CM

## 2017-04-23 DIAGNOSIS — I10 Essential (primary) hypertension: Secondary | ICD-10-CM | POA: Diagnosis not present

## 2017-04-23 DIAGNOSIS — Z86718 Personal history of other venous thrombosis and embolism: Secondary | ICD-10-CM

## 2017-04-23 DIAGNOSIS — C50511 Malignant neoplasm of lower-outer quadrant of right female breast: Secondary | ICD-10-CM | POA: Diagnosis not present

## 2017-04-23 DIAGNOSIS — R110 Nausea: Secondary | ICD-10-CM

## 2017-04-23 LAB — COMPREHENSIVE METABOLIC PANEL
ALK PHOS: 44 U/L (ref 40–150)
ALT: 31 U/L (ref 0–55)
AST: 39 U/L — ABNORMAL HIGH (ref 5–34)
Albumin: 2.9 g/dL — ABNORMAL LOW (ref 3.5–5.0)
Anion Gap: 8 mEq/L (ref 3–11)
BILIRUBIN TOTAL: 0.59 mg/dL (ref 0.20–1.20)
BUN: 7.4 mg/dL (ref 7.0–26.0)
CALCIUM: 9.6 mg/dL (ref 8.4–10.4)
CO2: 28 mEq/L (ref 22–29)
CREATININE: 0.8 mg/dL (ref 0.6–1.1)
Chloride: 105 mEq/L (ref 98–109)
EGFR: >90 mL/min/{1.73_m2} (ref >90)
Glucose: 94 mg/dl (ref 70–140)
Potassium: 3.5 mEq/L (ref 3.5–5.1)
Sodium: 141 mEq/L (ref 136–145)
Total Protein: 7 g/dL (ref 6.4–8.3)

## 2017-04-23 LAB — CBC WITH DIFFERENTIAL/PLATELET
BASO%: 0.4 % (ref 0.0–2.0)
Basophils Absolute: 0 10*3/uL (ref 0.0–0.1)
EOS%: 1.1 % (ref 0.0–7.0)
Eosinophils Absolute: 0.1 10*3/uL (ref 0.0–0.5)
HEMATOCRIT: 37.7 % (ref 34.8–46.6)
HGB: 12.5 g/dL (ref 11.6–15.9)
LYMPH%: 43.9 % (ref 14.0–49.7)
MCH: 31.2 pg (ref 25.1–34.0)
MCHC: 33.2 g/dL (ref 31.5–36.0)
MCV: 94 fL (ref 79.5–101.0)
MONO#: 0.5 10*3/uL (ref 0.1–0.9)
MONO%: 7.1 % (ref 0.0–14.0)
NEUT#: 3.4 10*3/uL (ref 1.5–6.5)
NEUT%: 47.5 % (ref 38.4–76.8)
PLATELETS: 248 10*3/uL (ref 145–400)
RBC: 4.01 10*6/uL (ref 3.70–5.45)
RDW: 15.2 % — ABNORMAL HIGH (ref 11.2–14.5)
WBC: 7.1 10*3/uL (ref 3.9–10.3)
lymph#: 3.1 10*3/uL (ref 0.9–3.3)
nRBC: 0 % (ref 0–0)

## 2017-04-23 MED ORDER — DEXAMETHASONE SODIUM PHOSPHATE 10 MG/ML IJ SOLN
INTRAMUSCULAR | Status: AC
  Filled 2017-04-23: qty 1

## 2017-04-23 MED ORDER — IRINOTECAN HCL CHEMO INJECTION 100 MG/5ML
140.0000 mg/m2 | Freq: Once | INTRAVENOUS | Status: AC
  Administered 2017-04-23: 340 mg via INTRAVENOUS
  Filled 2017-04-23: qty 15

## 2017-04-23 MED ORDER — HYDROCODONE-ACETAMINOPHEN 5-325 MG PO TABS: 1.0000 | ORAL_TABLET | Freq: Three times a day (TID) | ORAL | 0 refills | Status: DC | PRN

## 2017-04-23 MED ORDER — LORAZEPAM 1 MG PO TABS
ORAL_TABLET | ORAL | Status: AC
  Filled 2017-04-23: qty 1

## 2017-04-23 MED ORDER — SODIUM CHLORIDE 0.9 % IV SOLN
4.7000 mg/kg | Freq: Once | INTRAVENOUS | Status: AC
  Administered 2017-04-23: 600 mg via INTRAVENOUS
  Filled 2017-04-23: qty 16

## 2017-04-23 MED ORDER — PALONOSETRON HCL INJECTION 0.25 MG/5ML
0.2500 mg | Freq: Once | INTRAVENOUS | Status: AC
  Administered 2017-04-23: 0.25 mg via INTRAVENOUS

## 2017-04-23 MED ORDER — PROMETHAZINE HCL 25 MG PO TABS: 25.0000 mg | ORAL_TABLET | Freq: Four times a day (QID) | ORAL | 2 refills | Status: AC | PRN

## 2017-04-23 MED ORDER — LEUCOVORIN CALCIUM INJECTION 350 MG
400.0000 mg/m2 | Freq: Once | INTRAVENOUS | Status: AC
  Administered 2017-04-23: 976 mg via INTRAVENOUS
  Filled 2017-04-23: qty 48.8

## 2017-04-23 MED ORDER — SODIUM CHLORIDE 0.9 % IV SOLN
Freq: Once | INTRAVENOUS | Status: AC
  Administered 2017-04-23: 14:00:00 via INTRAVENOUS
  Filled 2017-04-23: qty 5

## 2017-04-23 MED ORDER — LORAZEPAM 0.5 MG PO TABS: 0.5000 mg | ORAL_TABLET | Freq: Three times a day (TID) | ORAL | 0 refills | Status: AC

## 2017-04-23 MED ORDER — ATROPINE SULFATE 1 MG/ML IJ SOLN
INTRAMUSCULAR | Status: AC
  Filled 2017-04-23: qty 1

## 2017-04-23 MED ORDER — LORAZEPAM 1 MG PO TABS
0.5000 mg | ORAL_TABLET | Freq: Once | ORAL | Status: AC
  Administered 2017-04-23: 0.5 mg via ORAL

## 2017-04-23 MED ORDER — PROCHLORPERAZINE MALEATE 10 MG PO TABS
10.0000 mg | ORAL_TABLET | Freq: Once | ORAL | Status: AC | PRN
  Administered 2017-04-23: 10 mg via ORAL

## 2017-04-23 MED ORDER — PALONOSETRON HCL INJECTION 0.25 MG/5ML
INTRAVENOUS | Status: AC
  Filled 2017-04-23: qty 5

## 2017-04-23 MED ORDER — ATROPINE SULFATE 1 MG/ML IJ SOLN
0.5000 mg | Freq: Once | INTRAMUSCULAR | Status: AC | PRN
  Administered 2017-04-23: 0.5 mg via INTRAVENOUS

## 2017-04-23 MED ORDER — PROCHLORPERAZINE MALEATE 10 MG PO TABS
ORAL_TABLET | ORAL | Status: AC
  Filled 2017-04-23: qty 1

## 2017-04-23 MED ORDER — SODIUM CHLORIDE 0.9 % IV SOLN
Freq: Once | INTRAVENOUS | Status: AC
  Administered 2017-04-23: 13:00:00 via INTRAVENOUS

## 2017-04-23 MED ORDER — DEXAMETHASONE SODIUM PHOSPHATE 10 MG/ML IJ SOLN: 10.0000 mg | Freq: Once | INTRAMUSCULAR | Status: DC

## 2017-04-23 MED ORDER — SODIUM CHLORIDE 0.9 % IV SOLN
2000.0000 mg/m2 | INTRAVENOUS | Status: DC
  Administered 2017-04-23: 4900 mg via INTRAVENOUS
  Filled 2017-04-23: qty 98

## 2017-04-23 NOTE — Patient Instructions (Addendum)
[facility] Discharge Instructions for Patients Receiving Chemotherapy  Today you received the following chemotherapy agents Avastin, Irrinotecan, Leucovorin, 71fu.   To help prevent nausea and vomiting after your treatment, we encourage you to take your nausea medication as prescribed.    If you develop nausea and vomiting that is not controlled by your nausea medication, call the clinic.   BELOW ARE SYMPTOMS THAT SHOULD BE REPORTED IMMEDIATELY:  *FEVER GREATER THAN 100.5 F  *CHILLS WITH OR WITHOUT FEVER  NAUSEA AND VOMITING THAT IS NOT CONTROLLED WITH YOUR NAUSEA MEDICATION  *UNUSUAL SHORTNESS OF BREATH  *UNUSUAL BRUISING OR BLEEDING  TENDERNESS IN MOUTH AND THROAT WITH OR WITHOUT PRESENCE OF ULCERS  *URINARY PROBLEMS  *BOWEL PROBLEMS  UNUSUAL RASH Items with * indicate a potential emergency and should be followed up as soon as possible.  Feel free to call the clinic you have any questions or concerns. The clinic phone number is [phone].  Please show the [facility] at check-in to the Emergency Department and triage nurse.   Fosaprepitant injection What is this medicine? FOSAPREPITANT (fos ap RE pi tant) is used together with other medicines to prevent nausea and vomiting caused by cancer treatment (chemotherapy). This medicine may be used for other purposes; ask your health care provider or pharmacist if you have questions. COMMON BRAND NAME(S): Emend What should I tell my health care provider before I take this medicine? They need to know if you have any of these conditions: -liver disease -an unusual or allergic reaction to fosaprepitant, aprepitant, medicines, foods, dyes, or preservatives -pregnant or trying to get pregnant -breast-feeding How should I use this medicine? This medicine is for injection into a vein. It is given by a health care professional in a hospital or clinic setting. Talk to your pediatrician regarding the use of  this medicine in children. Special care may be needed. Overdosage: If you think you have taken too much of this medicine contact a poison control center or emergency room at once. NOTE: This medicine is only for you. Do not share this medicine with others. What if I miss a dose? This does not apply. What may interact with this medicine? Do not take this medicine with any of these medicines: -cisapride -flibanserin -lomitapide -pimozide This medicine may also interact with the following medications: -diltiazem -female hormones, like estrogens or progestins and birth control pills -medicines for fungal infections like ketoconazole and itraconazole -medicines for HIV -medicines for seizures or to control epilepsy like carbamazepine or phenytoin -medicines used for sleep or anxiety disorders like alprazolam, diazepam, or midazolam -nefazodone -paroxetine -ranolazine -rifampin -some chemotherapy medications like etoposide, ifosfamide, vinblastine, vincristine -some antibiotics like clarithromycin, erythromycin, troleandomycin -steroid medicines like dexamethasone or methylprednisolone -tolbutamide -warfarin This list may not describe all possible interactions. Give your health care provider a list of all the medicines, herbs, non-prescription drugs, or dietary supplements you use. Also tell them if you smoke, drink alcohol, or use illegal drugs. Some items may interact with your medicine. What should I watch for while using this medicine? Do not take this medicine if you already have nausea and vomiting. Ask your health care provider what to do if you already have nausea. Birth control pills and other methods of hormonal contraception (for example, IUD or patch) may not work properly while you are taking this medicine. Use an extra method of birth control during treatment and for 1 month after your last dose of fosaprepitant. This medicine should not be used  continuously for a long  time. Visit your doctor or health care professional for regular check-ups. This medicine may change your liver function blood test results. What side effects may I notice from receiving this medicine? Side effects that you should report to your doctor or health care professional as soon as possible: -allergic reactions like skin rash, itching or hives, swelling of the face, lips, or tongue -breathing problems -changes in heart rhythm -high or low blood pressure -pain, redness, or irritation at site where injected -rectal bleeding -serious dizziness or disorientation, confusion -sharp or severe stomach pain -sharp pain in your leg Side effects that usually do not require medical attention (report to your doctor or health care professional if they continue or are bothersome): -constipation or diarrhea -hair loss -headache -hiccups -loss of appetite -nausea -upset stomach -tiredness This list may not describe all possible side effects. Call your doctor for medical advice about side effects. You may report side effects to FDA at 1-800-FDA-1088. Where should I keep my medicine? This drug is given in a hospital or clinic and will not be stored at home. NOTE: This sheet is a summary. It may not cover all possible information. If you have questions about this medicine, talk to your doctor, pharmacist, or health care provider.  2018 [employer] Standard (2014-10-18 10:45:34)

## 2017-04-23 NOTE — Progress Notes (Signed)
Ok to infuse 5Fu over 44hours at a rate of 3.13ml/hr.  Wylene Simmer, BSN, RN 04/23/2017 5:26 PM

## 2017-04-25 ENCOUNTER — Ambulatory Visit: Payer: BLUE CROSS/BLUE SHIELD

## 2017-04-25 VITALS — BP 154/84 | HR 70 | Temp 98.5°F | Resp 18

## 2017-04-25 DIAGNOSIS — C187 Malignant neoplasm of sigmoid colon: Secondary | ICD-10-CM

## 2017-04-25 MED ORDER — HEPARIN SOD (PORK) LOCK FLUSH 100 UNIT/ML IV SOLN
500.0000 [IU] | Freq: Once | INTRAVENOUS | Status: AC | PRN
  Administered 2017-04-25: 500 [IU]
  Filled 2017-04-25: qty 5

## 2017-04-25 MED ORDER — SODIUM CHLORIDE 0.9% FLUSH
10.0000 mL | INTRAVENOUS | Status: DC | PRN
  Administered 2017-04-25: 10 mL
  Filled 2017-04-25: qty 10

## 2017-05-04 ENCOUNTER — Telehealth: Payer: Self-pay

## 2017-05-04 NOTE — Telephone Encounter (Signed)
Pt called stating she needed to cancel Thursday treatment. She has to move from her abode on 9/3 and the treatment washes her out.  Also her CT CAP has not gotten PA yet.  Request for PA sent to [facility] ([facility]) via outlook

## 2017-05-05 NOTE — Telephone Encounter (Signed)
8/21 0916 PA gotten, called central scheduling to call pt.

## 2017-05-07 ENCOUNTER — Ambulatory Visit: Payer: BLUE CROSS/BLUE SHIELD

## 2017-05-07 ENCOUNTER — Ambulatory Visit (HOSPITAL_BASED_OUTPATIENT_CLINIC_OR_DEPARTMENT_OTHER): Payer: BLUE CROSS/BLUE SHIELD | Admitting: Hematology

## 2017-05-07 ENCOUNTER — Encounter: Payer: Self-pay | Admitting: Hematology

## 2017-05-07 ENCOUNTER — Other Ambulatory Visit (HOSPITAL_BASED_OUTPATIENT_CLINIC_OR_DEPARTMENT_OTHER): Payer: BLUE CROSS/BLUE SHIELD

## 2017-05-07 VITALS — BP 129/82 | HR 63 | Temp 98.7°F | Resp 20 | Ht 66.0 in | Wt 269.0 lb

## 2017-05-07 DIAGNOSIS — I10 Essential (primary) hypertension: Secondary | ICD-10-CM | POA: Diagnosis not present

## 2017-05-07 DIAGNOSIS — C787 Secondary malignant neoplasm of liver and intrahepatic bile duct: Secondary | ICD-10-CM | POA: Diagnosis not present

## 2017-05-07 DIAGNOSIS — Z79899 Other long term (current) drug therapy: Secondary | ICD-10-CM

## 2017-05-07 DIAGNOSIS — C187 Malignant neoplasm of sigmoid colon: Secondary | ICD-10-CM

## 2017-05-07 DIAGNOSIS — Z170 Estrogen receptor positive status [ER+]: Secondary | ICD-10-CM

## 2017-05-07 DIAGNOSIS — C50511 Malignant neoplasm of lower-outer quadrant of right female breast: Secondary | ICD-10-CM

## 2017-05-07 DIAGNOSIS — Z95828 Presence of other vascular implants and grafts: Secondary | ICD-10-CM

## 2017-05-07 DIAGNOSIS — C7800 Secondary malignant neoplasm of unspecified lung: Secondary | ICD-10-CM

## 2017-05-07 DIAGNOSIS — Z86718 Personal history of other venous thrombosis and embolism: Secondary | ICD-10-CM | POA: Diagnosis not present

## 2017-05-07 LAB — COMPREHENSIVE METABOLIC PANEL
ALBUMIN: 3.1 g/dL — AB (ref 3.5–5.0)
ALK PHOS: 48 U/L (ref 40–150)
ALT: 25 U/L (ref 0–55)
AST: 41 U/L — AB (ref 5–34)
Anion Gap: 8 mEq/L (ref 3–11)
BILIRUBIN TOTAL: 0.42 mg/dL (ref 0.20–1.20)
BUN: 6 mg/dL — AB (ref 7.0–26.0)
CO2: 26 mEq/L (ref 22–29)
CREATININE: 0.8 mg/dL (ref 0.6–1.1)
Calcium: 10 mg/dL (ref 8.4–10.4)
Chloride: 106 mEq/L (ref 98–109)
EGFR: >90 mL/min/{1.73_m2} (ref >90)
GLUCOSE: 109 mg/dL (ref 70–140)
Potassium: 3.9 mEq/L (ref 3.5–5.1)
SODIUM: 140 meq/L (ref 136–145)
TOTAL PROTEIN: 7.8 g/dL (ref 6.4–8.3)

## 2017-05-07 LAB — CBC WITH DIFFERENTIAL/PLATELET
BASO%: 0.6 % (ref 0.0–2.0)
Basophils Absolute: 0 10*3/uL (ref 0.0–0.1)
EOS%: 0.5 % (ref 0.0–7.0)
Eosinophils Absolute: 0 10*3/uL (ref 0.0–0.5)
HCT: 39.8 % (ref 34.8–46.6)
HEMOGLOBIN: 13.4 g/dL (ref 11.6–15.9)
LYMPH#: 2.9 10*3/uL (ref 0.9–3.3)
LYMPH%: 42.5 % (ref 14.0–49.7)
MCH: 31.6 pg (ref 25.1–34.0)
MCHC: 33.8 g/dL (ref 31.5–36.0)
MCV: 93.7 fL (ref 79.5–101.0)
MONO#: 0.6 10*3/uL (ref 0.1–0.9)
MONO%: 9.2 % (ref 0.0–14.0)
NEUT%: 47.2 % (ref 38.4–76.8)
NEUTROS ABS: 3.2 10*3/uL (ref 1.5–6.5)
Platelets: 301 10*3/uL (ref 145–400)
RBC: 4.25 10*6/uL (ref 3.70–5.45)
RDW: 16.7 % — ABNORMAL HIGH (ref 11.2–14.5)
WBC: 6.7 10*3/uL (ref 3.9–10.3)

## 2017-05-07 LAB — UA PROTEIN, DIPSTICK - CHCC: Protein, ur: NEGATIVE mg/dL

## 2017-05-07 LAB — CEA (IN HOUSE-CHCC): CEA (CHCC-In House): 5.29 ng/mL — ABNORMAL HIGH (ref 0.00–5.00)

## 2017-05-07 MED ORDER — ACETAMINOPHEN-CODEINE #4 300-60 MG PO TABS: 1.0000 | ORAL_TABLET | Freq: Three times a day (TID) | ORAL | 0 refills | Status: DC | PRN

## 2017-05-07 MED ORDER — PALONOSETRON HCL INJECTION 0.25 MG/5ML
INTRAVENOUS | Status: AC
  Filled 2017-05-07: qty 5

## 2017-05-07 MED ORDER — HEPARIN SOD (PORK) LOCK FLUSH 100 UNIT/ML IV SOLN
500.0000 [IU] | Freq: Once | INTRAVENOUS | Status: AC | PRN
Start: 2017-05-07 — End: 2017-05-07
  Administered 2017-05-07: 500 [IU] via INTRAVENOUS
  Filled 2017-05-07: qty 5

## 2017-05-07 MED ORDER — SODIUM CHLORIDE 0.9% FLUSH
10.0000 mL | INTRAVENOUS | Status: DC | PRN
  Administered 2017-05-07: 10 mL via INTRAVENOUS
  Filled 2017-05-07: qty 10

## 2017-05-07 MED ORDER — ALTEPLASE 2 MG IJ SOLR
2.0000 mg | Freq: Once | INTRAMUSCULAR | Status: DC | PRN
  Filled 2017-05-07: qty 2

## 2017-05-07 MED ORDER — PROCHLORPERAZINE MALEATE 10 MG PO TABS
ORAL_TABLET | ORAL | Status: AC
  Filled 2017-05-07: qty 1

## 2017-05-07 NOTE — Progress Notes (Signed)
[facility] PROGRESS NOTE DATE OF VISIT: 05/07/2017   [name], MD [address] [place] [address]  DIAGNOSIS: recurrent Cancer of sigmoid colon ([place]) - Plan: acetaminophen-codeine (TYLENOL #4) 300-60 MG tablet  Malignant neoplasm of lower-outer quadrant of right breast of female, estrogen receptor positive ([place])  Personal history of venous thrombosis and embolism  Obesity, Class III, BMI 40-49.9 (morbid obesity) ([place])  Essential hypertension, benign  Port catheter in place - Plan: alteplase (CATHFLO ACTIVASE) injection 2 mg, heparin lock flush 100 unit/mL, sodium chloride flush (NS) 0.9 % injection 10 mL  PROBLEM LIST 1. Left breast DCIS diagnosed in 2006 and 2010, status post lumpectomy. 2. pT2N0M0 stage I colon cancer, s/p partial colectomy on 03/09/2014  3. PE in 12/2013 when she was diagnosed with colon cancer. 4. 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 [facility])   Staging form: Breast, AJCC 7th Edition     Clinical stage from 02/01/2015: Stage IA (T1a, N0, M0) - Signed by [name], MD on 06/04/2015     Pathologic stage from 11/10/2015: Stage IA (T1b, N0, M0) - Signed by [name], MD on 12/08/2015 Cancer of sigmoid colon [facility])   Staging form: Colon and Rectum, AJCC 7th Edition     Clinical: Stage I (T2, N0, M0) - Unsigned     Breast cancer of lower-outer quadrant of right female breast ([place])   2006 Cancer Diagnosis    Left breast DCIS diagnosed in 2006 and 2010, status post lumpectomy.      01/07/2014 Initial Diagnosis    Breast cancer      01/18/2015 Mammogram    71m mass in lower outer quadrant of right breast       02/01/2015 Initial Biopsy    (R) breast mass biopsy showed invasive ductal carcinoma & DCIS, grade 1-2.       02/01/2015 Receptors her2    ER 90%+, PR 10%+, Ki67 10%, HER2 negative (ratio 1.09)        11/07/2015 Surgery    (R) breast lumpectomy and sentinel lymph node biopsy ([name]      11/07/2015 Pathology Results    (R) breast lumpectomy showed invasive ductal carcinoma, grade 2, 1 cm, low-grade DCIS, negative margins, 3 sentinel lymph nodes were negative. LVI (-). HER2 repeated and remains negative (ratio 1.29).        11/07/2015 Pathologic Stage    pT1b,pN0: Stage IA       11/07/2015 Oncotype testing    RS 22, which predicts a year risk of distant recurrence with tamoxifen alone 14% (intermediate-risk). Adjuvant chemo was not offered      02/21/2016 - 04/10/2016 Radiation Therapy    Adjuvant breast radiation. ([name]). Right breast: 50.4 Gy in 28 fractions.  Right breast boost: 12 Gy in 6 fractions.      05/2016 -  Anti-estrogen oral therapy    Femara (Letrozole) 2.5 mg daily.       10/17/2016 Mammogram    MM DIAG BREAST TOMO BILATERAL 10/17/2016 FINDINGS: There are bilateral lumpectomy changes in the upper central left breast and upper central right breast. No mass, nonsurgical distortion, or suspicious microcalcification is identified in either breast to suggest malignancy. Mammographic images were processed with CAD. IMPRESSION: No evidence of malignancy in either breast. Lumpectomy changes bilaterally. RECOMMENDATION: Diagnostic mammogram is suggested in 1 year. (Code:DM-B-01Y)  Cancer of sigmoid colon ([place])   03/04/2014 Initial Diagnosis    Cancer of sigmoid colon      03/09/2014 Pathology Results    G1 invasive adenocarcinoma, no lymph-Vascular invasion or Peri-neural invasion: Absent. MMR normal, MSI stable.       03/09/2014 Surgery    partial colectomy, negative margins.       11/10/2014 Relapse/Recurrence    biopsy confirmed oligo liver recurrence       11/10/2014 Imaging    PET showed two small ares of hypermetabolism in the right lobe of the liver, no other distant metastasis.       11/30/2014 Imaging    abdomen MRI showed a new enhancing lesion which  corresponds to an area of abnormal hyper metabolism on recent PET-CT. Stable enlarged portal caval lymph node.       01/02/2015 Pathology Results    Liver biopsy showed metastatic adenocarcinoma, IHC (+) for CK20, CDX-2, (-) for TTF-1 and ER      01/02/2015 Miscellaneous    liver biopsy KRAS mutation (+)       02/14/2015 Imaging    CT showed:  the small metastatic right hepatic lobe liver lesion is not identified for certain on this examination. No new lesions. Stable small cysts in segment 4 a.       03/05/2015 - 04/16/2015 Chemotherapy    CAPEOX (capecitabine 2500 mg twice daily Day 1-14, oxaliplatin 1 30 mg/m on day 1, every 21 days, S/P 3 cycles       08/24/2015 Procedure    CT-guided oligo liver metastasis microwave ablation by Dr. [name]      06/17/2016 Imaging    CT of the chest/abd/pelvis with contrast 1. Several new small pulmonary nodules worrisome for metastatic disease. 2. New large area of probable infiltrating recurrent tumor in the right hepatic lobe surrounding the prior ablation site. 3. Small supraclavicular lymph nodes.  Attention on future studies. 4. Stable surgical changes involving both breasts. No breast masses are identified. 5. Stable bilateral adrenal gland nodules. 6. Stable upper abdominal and right-sided retroperitoneal lymph nodes.      07/08/2016 PET scan    1. Large mass in the right lobe of the liver demonstrates diffuse hypermetabolism, compatible with recurrent metastatic disease in the liver 2. Multiple small pulmonary nodules are very similar to the recent Chest CT 06/17/16.  3. No other findings of metastatic disease elsewhere in the neck, chest, abdomen, or pelvis. 4. There is some low-level hypermetabolic activity adjacent to the surgical clips in the lateral aspect of the right breast at the site of prior lumpectomy.       07/29/2016 Pathology Results    Liver biopsy confirmed metastatic colon cancer       07/31/2016 -  Chemotherapy     FOLFIRI every 2 weeks, Avastin added from cycle 2       10/23/2016 Imaging    CT ABDOMEN PELVIS W CONTRAST 10/24/16 IMPRESSION: 1. The numerous tiny bilateral pulmonary nodules seen on the previous imaging exams are no longer evident, suggesting response of therapy. 2. Large ill-defined irregular right hepatic lesion measures smaller on today's study. 3. Slight increase in size of hepatoduodenal ligament lymphadenopathy. 4. Abdominal Aortic Atherosclerois (ICD10-170.0)      01/05/2017 Imaging    CT CAP IMPRESSION: 1. Left lower lobe pulmonary nodule and hepatic metastatic disease are stable. 2. Hepatoduodenal ligament lymph node is stable. 3. Aortic atherosclerosis (ICD10-170.0). Coronary artery calcification. 4. Enlarged pulmonary arteries, indicative of pulmonary arterial hypertension. 5.  Small bilateral adrenal nodules are unchanged.        CURRENT TREATMENT:   1. Letrozole 2.5 mg once daily, started in September 2017 2. Chemotherapy FOLFIRI and Avastin (from cycle 2) every 2 weeks, started on 07/31/2016, multiple delay per pt's request; chemo break from 5/30-6/21/18; decreased irinotecan to 174m/m2, 5-fu 20070mm2 from 03/05/17, due to poor tolerance.   INTERIM HISTORY:  Veronica RISDON33.o. female with a history of recurrent left breast cancer, colon cancer s/p laparoscopic-assisted partial colectomy (03/09/2014), PE (01/06/2014), and right breast cancer (01/2015),  presents to clinic for follow up.     She has been doing well. She does not want to undergo treatment today due to having to pack to move. She would like to move her appointment to the week after on 9/13. She has been moderately fatigue due to stress of moving. She still has some pain in her right flank, on scale, a 5/10. She also admits to having some numbness in her right foot with prohibits her from driving. No fever, chills or other concerns.   MEDICAL HISTORY: Past Medical History:  Diagnosis Date  .  Anxiety   . Breast CA ([facility]   radiation and surgery-lt.  . [place]   left breast cancer x2  . Hypertension   . Liver lesion   . Pulmonary embolism ([facility]   "blood clot in lungs" -dx. 4'15 with CT Chest. On Xarelto  . Radiation 01/22/10-03/12/10   left whole breast 4500 cGy, upper aspect boosted to 6120 cGy  . Radiation 02/21/16 - 04/10/16   right breast 50.4 Gy, right breast boost 12 Gy  . Shortness of breath    sob with exertion. dx. with Pulmonary emboli 4'15 ,tx. Xarelto,Lovenox used for [employer]   ALLERGIES:  is allergic to tramadol.  MEDICATIONS:  Allergies as of 05/07/2017      Reactions   Tramadol Other (See Comments)   Dizziness      Medication List       Accurate as of 05/07/17 11:40 PM. Always use your most recent med list.          acetaminophen-codeine 300-60 MG tablet Commonly known as:  TYLENOL #4 Take 1 tablet by mouth every 8 (eight) hours as needed. for pain   amLODipine 5 MG tablet Commonly known as:  NORVASC Take 1 tablet (5 mg total) by mouth daily.   dexamethasone 4 MG tablet Commonly known as:  DECADRON Take 1 tablet (4 mg total) by mouth daily.   ferrous sulfate 325 (65 FE) MG tablet Take 325 mg by mouth daily with breakfast. Pt takes  4 times per week.   hydrochlorothiazide 25 MG tablet Commonly known as:  HYDRODIURIL Take 1 tablet (25 mg total) by mouth every morning.   HYDROcodone-acetaminophen 5-325 MG tablet Commonly known as:  NORCO Take 1 tablet by mouth every 8 (eight) hours as needed for moderate pain.   letrozole 2.5 MG tablet Commonly known as:  FEMARA TAKE 1 TABLET BY MOUTH EVERY DAY   lidocaine-prilocaine cream Commonly known as:  EMLA Apply 1 application topically as needed.   LORazepam 0.5 MG tablet Commonly known as:  ATIVAN Take 1 tablet (0.5 mg total) by mouth every 8 (eight) hours.   magic mouthwash w/lidocaine Soln Take 5 mLs by mouth 4 (four) times daily as needed for mouth pain.   metoprolol tartrate  50 MG tablet Commonly known as:  LOPRESSOR Take 1 tablet (50 mg total) by mouth  2 (two) times daily.   ondansetron 8 MG tablet Commonly known as:  ZOFRAN Take 1 tablet (8 mg total) by mouth 2 (two) times daily as needed for refractory nausea / vomiting. Start on day 3 after chemotherapy.   potassium chloride SA 20 MEQ tablet Commonly known as:  K-DUR,KLOR-CON TAKE 1 TABLET BY MOUTH 2 TIMES DAILY   prochlorperazine 10 MG tablet Commonly known as:  COMPAZINE Take 1 tablet (10 mg total) by mouth every 6 (six) hours as needed (NAUSEA).   promethazine 25 MG tablet Commonly known as:  PHENERGAN Take 1 tablet (25 mg total) by mouth every 6 (six) hours as needed for nausea or vomiting.   rivaroxaban 20 MG Tabs tablet Commonly known as:  XARELTO Take 1 tablet (20 mg total) by mouth daily.   zolpidem 5 MG tablet Commonly known as:  AMBIEN Take 1 tablet (5 mg total) by mouth at bedtime as needed for sleep.            Discharge Care Instructions        Start     Ordered   05/07/17 0000  acetaminophen-codeine (TYLENOL #4) 300-60 MG tablet  Every 8 hours PRN     05/07/17 1033      SURGICAL HISTORY:  Past Surgical History:  Procedure Laterality Date  . BREAST LUMPECTOMY WITH RADIOACTIVE SEED AND SENTINEL LYMPH NODE BIOPSY Right 11/07/2015   Procedure: BREAST LUMPECTOMY WITH RADIOACTIVE SEED AND SENTINEL LYMPH NODE BIOPSY;  Surgeon: Stark Klein, MD;  Location: [facility];  Service: General;  Laterality: Right;  . BREAST SURGERY     '11- Left breast lumpectomy  . CESAREAN SECTION    . CHOLECYSTECTOMY     Laparoscopic 20 yrs ago  . COLON SURGERY  03-09-14   partial colectomy for cancer  . IR GENERIC HISTORICAL  07/29/2016   IR FLUORO GUIDE PORT INSERTION RIGHT 07/29/2016 Sandi Mariscal, MD [facility]  . IR GENERIC HISTORICAL  07/29/2016   IR US GUIDE VASC ACCESS RIGHT 07/29/2016 Sandi Mariscal, MD [facility]  . IR GENERIC HISTORICAL  07/29/2016   IR US GUIDE BX  ASP/DRAIN 07/29/2016 [facility]  . PARTIAL COLECTOMY N/A 03/09/2014   Procedure: LAPAROSCOPIC ASSISTED PARTIAL COLECTOMY, splenic flexure mobilization;  Surgeon: Leighton Ruff, MD;  Location: [facility];  Service: General;  Laterality: N/A;  . RADIOFREQUENCY ABLATION Right 08/24/2015   Procedure: MICROWAVE ABLATION RIGHT LIVER LOBE ;  Surgeon: Corrie Mckusick, DO;  Location: [facility];  Service: Anesthesiology;  Laterality: Right;    REVIEW OF SYSTEMS:  Constitutional: Denies fevers, chills or abnormal weight loss (+) moderately fatigued Eyes: Denies blurriness of vision Ears, nose, mouth, throat, and face: Denies mucositis or sore throat Respiratory: Denies cough, dyspnea or wheezes Cardiovascular: Denies palpitation, chest discomfort or lower extremity swelling Gastrointestinal:  Denies heartburn or change in bowel habits.  Skin: Denies abnormal skin rashes Musculoskeletal: (+) intermittent right-sided flank pain  Lymphatics: Denies new lymphadenopathy or easy bruising Neurological:Denies numbness, tingling or new weaknesses (+)numbness in right foot Musculoskeletal: normal Behavioral/Psych: Mood is stable, no new changes  All other systems were reviewed with the patient and are negative.    PHYSICAL EXAMINATION:  ECOG PERFORMANCE STATUS: 2  Blood pressure 129/82, pulse 63, temperature 98.7 F (37.1 C), temperature source Oral, resp. rate 20, height '5\' 6"'  (1.676 m), weight 269 lb (122 kg), last menstrual period 01/23/2014, SpO2 100 %.  Vitals:   05/07/17 1029  BP: 129/82  Pulse: 63  Resp: 20  Temp: 98.7 F (37.1 C)  TempSrc: Oral  SpO2: 100%  Weight: 269 lb (122 kg)  Height: '5\' 6"'  (1.676 m)     GENERAL:alert, no distress and comfortable; well developed and obese. Easily mobile to exam table.  SKIN: skin color, texture, turgor are normal, no rashes or significant lesions EYES: normal, Conjunctiva are pink and non-injected, sclera clear OROPHARYNX:no exudate, no erythema and lips,  buccal mucosa, and tongue normal  NECK: supple, thyroid normal size, non-tender, without nodularity LYMPH:  no palpable lymphadenopathy in the cervical, axillary or supraclavicular LUNGS: clear to auscultation with normal breathing effort, no wheezes or rhonchi HEART: regular rate & rhythm and no murmurs and no lower extremity edema ABDOMEN:abdomen soft, non-tender and normal bowel sounds; incision healing with signs of infection.  Mild tenderness at the  Right flank area, no abdominal tenderness. No local megaly Musculoskeletal:no cyanosis of digits and no clubbing, limited ROM of left shoulder  NEURO: alert & oriented x 3 with fluent speech, no focal motor/sensory deficits   Labs:  CBC Latest Ref Rng & Units 05/07/2017 04/23/2017 04/09/2017  WBC 3.9 - 10.3 10e3/uL 6.7 7.1 8.7  Hemoglobin 11.6 - 15.9 g/dL 13.4 12.5 13.6  Hematocrit 34.8 - 46.6 % 39.8 37.7 41.4  Platelets 145 - 400 10e3/uL 301 248 233    CMP Latest Ref Rng & Units 05/07/2017 04/23/2017 04/09/2017  Glucose 70 - 140 mg/dl 109 94 96  BUN 7.0 - 26.0 mg/dL 6.0(L) 7.4 9.4  Creatinine 0.6 - 1.1 mg/dL 0.8 0.8 0.8  Sodium 136 - 145 mEq/L 140 141 140  Potassium 3.5 - 5.1 mEq/L 3.9 3.5 3.8  Chloride 101 - 111 mmol/L - - -  CO2 22 - 29 mEq/L '26 28 25  ' Calcium 8.4 - 10.4 mg/dL 10.0 9.6 10.0  Total Protein 6.4 - 8.3 g/dL 7.8 7.0 8.0  Total Bilirubin 0.20 - 1.20 mg/dL 0.42 0.59 0.65  Alkaline Phos 40 - 150 U/L 48 44 47  AST 5 - 34 U/L 41(H) 39(H) 50(H)  ALT 0 - 55 U/L 25 31 36    CEA  01/18/2014: 0.8 03/01/2015: 1.3 01/31/2016: 4.8 05/28/2016: 48 07/31/2016: 173.7 08/28/16: 139 09/25/2016: 43 11/04/16: 9.94 12/18/2016: 6.99 01/29/17: 4.58 03/04/17: 6.09 04/09/17: 4.22   PATHOLOGY RESULT Diagnosis 01/02/2015 Liver, needle/core biopsy, right - POSITIVE FOR METASTATIC ADENOCARCINOMA. - SEE COMMENT.   Diagnosis 11/07/2015 1. Breast, lumpectomy, Right - INVASIVE DUCTAL CARCINOMA, GRADE 2, SPANNING 1 CM. - DUCTAL CARCINOMA IN SITU,  LOW GRADE. - RESECTION MARGINS ARE NEGATIVE FOR INVASIVE CARCINOMA. - DUCTAL CARCINOMA IN SITU COMES TO WITHIN 0.3 CM OF THE POSTERIOR MARGIN. - BIOPSY SITE. - SEE ONCOLOGY TABLE. 2. Lymph node, sentinel, biopsy, right axillary #1 - ONE OF ONE LYMPH NODES NEGATIVE FOR CARCINOMA (0/1). 3. Lymph node, sentinel, biopsy, right axillary #2 - ONE OF ONE LYMPH NODES NEGATIVE FOR CARCINOMA (0/1). 4. Lymph node, sentinel, biopsy, right axillary #3 - ONE OF ONE LYMPH NODES NEGATIVE FOR CARCINOMA (0/1).  ONCOTYPE DX: RS 23, which predicts a year risk of distant recurrence with tamoxifen alone 14%   Diagnosis 07/29/2016 Liver, needle/core biopsy, posterior of right lobe METASTATIC ADENOCARCINOMA, CONSISTENT WITH COLONIC PRIMARY.  RADIOLOGY STUDIES   PET 07/08/2016 IMPRESSION: 1. Large mass in the right lobe of the liver demonstrates diffuse hypermetabolism, compatible with recurrent metastatic disease in the liver. 2. Multiple small pulmonary nodules are very similar to the recent chest CT 06/17/2016. These do not demonstrate hypermetabolism on the PET images, however, these lesions are  well below the level of PET imaging. Given that these nodules are new compared to prior chest CT 12/18/2015, they are concerning for potential metastatic disease to the lungs, and continued attention on followup studies is recommended. 3. No other findings of metastatic disease elsewhere in the neck, chest, abdomen or pelvis. 4. There is some low-level hypermetabolic activity adjacent to the surgical clips in the lateral aspect of the right breast at the site of prior lumpectomy. This area has undergone interval involution over the several prior examinations, and this low-level activity is favored to be related to normal healing. Attention on follow-up imaging is recommended to ensure the stability or resolution of this finding. 5. Aortic atherosclerosis, in addition to 2 vessel coronary artery disease.  Please note that although the presence of coronary artery calcium documents the presence of coronary artery disease, the severity of this disease and any potential stenosis cannot be assessed on this non-gated CT examination. Assessment for potential risk factor modification, dietary therapy or pharmacologic therapy may be warranted, if clinically indicated. 6. Additional incidental findings, as above.  MM DIAG BREAST TOMO BILATERAL 10/17/2016 FINDINGS: There are bilateral lumpectomy changes in the upper central left breast and upper central right breast. No mass, nonsurgical distortion, or suspicious microcalcification is identified in either breast to suggest malignancy. Mammographic images were processed with CAD. IMPRESSION: No evidence of malignancy in either breast. Lumpectomy changes bilaterally. RECOMMENDATION: Diagnostic mammogram is suggested in 1 year. (Code:DM-B-01Y)  CT CHEST, ABDOMEN PELVIS W CONTRAST 10/24/16 IMPRESSION: 1. The numerous tiny bilateral pulmonary nodules seen on the previous imaging exams are no longer evident, suggesting response of therapy. 2. Large ill-defined irregular right hepatic lesion measures smaller on today's study. 3. Slight increase in size of hepatoduodenal ligament lymphadenopathy. 4. Abdominal Aortic Atherosclerois (ICD10-170.0)  CT CAP 01/05/17 IMPRESSION: 1. Left lower lobe pulmonary nodule and hepatic metastatic disease are stable. 2. Hepatoduodenal ligament lymph node is stable. 3. Aortic atherosclerosis (ICD10-170.0). Coronary artery calcification. 4. Enlarged pulmonary arteries, indicative of pulmonary arterial hypertension. 5. Small bilateral adrenal nodules are unchanged.   ASSESSMENT: Veronica Reyes 61 y.o. female   1. Colon Cancer, Stage I (pT2N0), MMR normal, oligo recurrent disease in liver in 12/2014, KRAS mutation (+), liver and lung recurrence in 06/2016 -I previously reviewed the nature history of metastatic colon  cancer, and the potential curative approach with chemotherapy followed by liver lesion ablation or resection. However, more likely, this is not curable disease. -She initially received 3 cycles of Capeox, does not want to proceed with fourth cycle due to the moderate side effects from previous chemotherapy treatment. She declined liver mass resection, and underwent liver ablation by interventional radiologist Dr. Earleen Newport in 08/2015. - I previously reviewed her restaging CT scan from 06/17/2016, which unfortunately showed new area of infiltrative disease as the previous ablated liver lesion, likely recurrence. She also developed several new pulmonary nodules, small, concerning for metastatic disease. 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 --She has started chemotherapy FOLFIRI and avastin, has been tolerating moderately well, we'll continue every 2 weeks. -Her CEA has significantly decreased since she started chemotherapy, likely responding to her good response to chemotherapy. -Repeat CT scan on 10/23/16 showed resolution of numerous bilateral pulmonary nodules, the right hepatic lesion is smaller, and slight decrease in size of hepatoduodenal ligament lymphadenopathy. No other new lesions  -I previously reviewed her restaging CT scan from 01/05/2017, which showed stable pulmonary and liver metastasis, no other new  lesions. -she has been tolerating chemo poorly overall, I have decreased her chemo dose a few times. She has also frequently requested chemotherapy break due to multiple reasons. -Due to her worsening nausea, I'll add IV Emend before chemotherapy today, and I previously called in Phenergan 25 mg as needed for her nausea at home. She uses Compazine also. She doesn't respond well to Zofran. - CT CAP is scheduled for  tomorrow - Labs reviewed, blood counts overall normal. Patient requested to hold chemotherapy today and postpone to  a few weeks due to her coming trip.   2. Right breast cancer, pT1bN0M0, stage Ia, ER+/PR+/HER2-, History of left Breast Cancer, diagnosed on 02/01/2015 --Left CIS in situ, s/p lumpectomy in 2006 and 2011 by Dr. Margot Chimes, and radiation in 2011 by Dr. Sondra Come.  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 Surgical margins were negative. -I previously reviewed her Oncotype DX result. Her recurrence score is 22, which predicts 14% 10 year risk of distant recurrence with tamoxifen alone. This is intermediate risk, based on her relatively low number, I do not recommend adjuvant chemotherapy. -She has completed adjuvant breast radiation -her Bland level supports she is postmenopausal -She has started adjuvant letrozole, tolerating well, we'll continue. -We previously discussed breast cancer surveillance, I strongly encouraged her to continue annual screening mammogram, self exam, and follow-up with [place] routinely. -her recent mammogram was normal 10/2016  3. History of PE (01/06/2014) --She has a diagnosis of pulmonary embolism, likely secondary to malignancy in the setting of family history for stroke (and possible stroke history in patient as well). Lower extremity dopplers negative.  -continue Xarelto. Due to her metastatic colon cancer, I previously recommended her to continue indefinitely.  4.  Uterine Fibroids  --Negative endometrial biopsy. Following with Gynecology.  5. Kidney lesion (Right). -No hypermetabolic right kidney lesion on the pet scan -Repeat abdominal MRI 05/29/2015 showed unchanged right renal lesion, favored to represent a complex/septated cyst.  6. Right flank pain - possible related to her liver metastasis -improved with chemo, seems to be worse lately  -She'll continue hydrocodone as needed, I encouraged her to take as needed to better control her pain. -She uses Hydrocortisone and would like a  refill today -Will order Tylenol #4 today   7.  Morbid obesity - I have previously encouraged her to eat healthy and exercise more.  8. HTN  -The patient's blood pressure is slightly elevated today. Could be related to the Avastin. -She does not have a monitor at home. I have advised her to monitor at home. She will continue following up with her PCP -Her HTN has been higher lately so I suggest she increase her dose. I will refill today while she changes her PCP. -Will repeat her BP in the infusion room  9. Nose bleeds - I have previously  discussed the patient her nosebleeds could be related to Avastin  -she knows to avoid blowing nose  10. Oral surgery -She wants to get her teeth pulled but wants to know when she can. I suggested her to do it at least two weeks after chemo, and I will likely give her extra week chemo break for her recovery  -She will follow up with dentist first.    Plan  - hold treatment today per patient's request - refill Tylenol #4 -  CT CAP w contrast tomorrow -Patient request her next chemotherapy to be postponed to September 13,  will schedule   All questions were answered. The patient knows to call the clinic with any problems, questions or concerns. We can certainly see the patient much sooner if necessary.  I spent 20 minutes counseling the patient face to face. The total time spent in the appointment was 25 minutes.  This document serves as a record of services personally performed by [name], MD. It was created on her behalf by Brandt Loosen, a trained medical scribe. The creation of this record is based on the scribe's personal observations and the provider's statements to them. This document has been checked and approved by the attending provider.   I have reviewed the above documentation for accuracy and completeness, and I agree with the above information.      [name]  05/07/2017

## 2017-05-08 ENCOUNTER — Encounter: Payer: Self-pay | Admitting: Hematology

## 2017-05-08 ENCOUNTER — Ambulatory Visit (HOSPITAL_COMMUNITY)
Admission: RE | Admit: 2017-05-08 | Discharge: 2017-05-08 | Disposition: A | Discharge status: Home / Self Care | Payer: BLUE CROSS/BLUE SHIELD | Source: Ambulatory Visit | Attending: Hematology | Admitting: Hematology

## 2017-05-08 ENCOUNTER — Other Ambulatory Visit: Payer: Self-pay

## 2017-05-08 DIAGNOSIS — C50511 Malignant neoplasm of lower-outer quadrant of right female breast: Secondary | ICD-10-CM | POA: Insufficient documentation

## 2017-05-08 DIAGNOSIS — R160 Hepatomegaly, not elsewhere classified: Secondary | ICD-10-CM | POA: Diagnosis not present

## 2017-05-08 DIAGNOSIS — Z170 Estrogen receptor positive status [ER+]: Secondary | ICD-10-CM | POA: Diagnosis not present

## 2017-05-08 DIAGNOSIS — R918 Other nonspecific abnormal finding of lung field: Secondary | ICD-10-CM | POA: Diagnosis not present

## 2017-05-08 DIAGNOSIS — I700 Atherosclerosis of aorta: Secondary | ICD-10-CM | POA: Diagnosis not present

## 2017-05-08 MED ORDER — IOPAMIDOL (ISOVUE-300) INJECTION 61%
100.0000 mL | Freq: Once | INTRAVENOUS | Status: AC | PRN
  Administered 2017-05-08: 100 mL via INTRAVENOUS

## 2017-05-08 MED ORDER — IOPAMIDOL (ISOVUE-300) INJECTION 61%
INTRAVENOUS | Status: AC
  Filled 2017-05-08: qty 100

## 2017-05-11 ENCOUNTER — Other Ambulatory Visit: Payer: Self-pay | Admitting: Hematology

## 2017-05-12 ENCOUNTER — Telehealth: Payer: Self-pay

## 2017-05-12 MED ORDER — METOPROLOL TARTRATE 50 MG PO TABS: 50.0000 mg | ORAL_TABLET | Freq: Two times a day (BID) | ORAL | 0 refills | Status: DC

## 2017-05-12 MED ORDER — HYDROCHLOROTHIAZIDE 25 MG PO TABS
25.0000 mg | ORAL_TABLET | ORAL | 0 refills | Status: DC
Start: 2017-05-12 — End: 2017-06-04

## 2017-05-12 MED ORDER — AMLODIPINE BESYLATE 5 MG PO TABS: 5.0000 mg | ORAL_TABLET | Freq: Every day | ORAL | 0 refills | Status: DC

## 2017-05-12 NOTE — Telephone Encounter (Signed)
Pt called for refill on her blood pressure med b/c her PCP cannot see her until 9/8. Per OV note 8/23 Dr Burr Medico is refilling while pt changes her PCP. Done for 1 month supply of metoprolol, HCTZ and amlodipine.

## 2017-05-20 ENCOUNTER — Telehealth: Payer: Self-pay | Admitting: Hematology

## 2017-05-20 NOTE — Telephone Encounter (Signed)
UNABLE TO REACH PATIENT OR HUSBAND ON BOTH PHONE NUMBERS TO INFORM OF CXLED 9/6 APPT AND R/S TO 9/13 AT 1115 AM PER [place] MS[facility]

## 2017-05-20 NOTE — Telephone Encounter (Signed)
This nurse tried to call [phone] number provided by patient receiving automatic message "This mail box is full".  This number was confirmed through [facility] call identification option when message received.  Voicemail: "You all have scheduled me before the 13 th and should not have because I'm in the process of moving.  I have no where to stay so I'm staying with my niece until I move into my apartment.  Call me [phone]."

## 2017-05-21 ENCOUNTER — Other Ambulatory Visit: Payer: BLUE CROSS/BLUE SHIELD

## 2017-05-21 ENCOUNTER — Ambulatory Visit: Payer: BLUE CROSS/BLUE SHIELD

## 2017-05-21 ENCOUNTER — Ambulatory Visit: Payer: BLUE CROSS/BLUE SHIELD | Admitting: Hematology

## 2017-05-25 NOTE — Progress Notes (Signed)
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, estrogen receptor positive ([place]) - Plan: sodium chloride 0.9 % injection 10 mL, heparin lock flush 100 unit/mL  Cancer of sigmoid colon ([name]) - Plan: sodium chloride 0.9 % injection 10 mL, heparin lock flush 100 unit/mL  Essential hypertension, benign - Plan: sodium chloride 0.9 % injection 10 mL, heparin lock flush 100 unit/mL  Obesity, Class III, BMI 40-49.9 (morbid obesity) ([place]) - Plan: sodium chloride 0.9 % injection 10 mL, heparin lock flush 100 unit/mL  Personal history of venous thrombosis and embolism - Plan: sodium chloride 0.9 % injection 10 mL, heparin lock flush 100 unit/mL  PROBLEM LIST 1. Left breast DCIS diagnosed in 2006 and 2010, status post lumpectomy. 2. pT2N0M0 stage I colon cancer, s/p partial colectomy on 03/09/2014  3. PE in 12/2013 when she was diagnosed with colon cancer. 4. 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 [facility])   Staging form: Breast, AJCC 7th Edition     Clinical stage from 02/01/2015: Stage IA (T1a, N0, M0) - Signed by [name], MD on 06/04/2015     Pathologic stage from 11/10/2015: Stage IA (T1b, N0, M0) - Signed by [name], MD on 12/08/2015 Cancer of sigmoid colon [facility])   Staging form: Colon and Rectum, AJCC 7th Edition     Clinical: Stage I (T2, N0, M0) - Unsigned     Breast cancer of lower-outer quadrant of right female breast ([place])   2006 Cancer Diagnosis    Left breast DCIS diagnosed in 2006 and 2010, status post lumpectomy.      01/07/2014 Initial Diagnosis    Breast cancer      01/18/2015 Mammogram    41m mass in lower outer quadrant of right breast       02/01/2015 Initial Biopsy    (R) breast  mass biopsy showed invasive ductal carcinoma & DCIS, grade 1-2.       02/01/2015 Receptors her2    ER 90%+, PR 10%+, Ki67 10%, HER2 negative (ratio 1.09)       11/07/2015 Surgery    (R) breast lumpectomy and sentinel lymph node biopsy ([name]      11/07/2015 Pathology Results    (R) breast lumpectomy showed invasive ductal carcinoma, grade 2, 1 cm, low-grade DCIS, negative margins, 3 sentinel lymph nodes were negative. LVI (-). HER2 repeated and remains negative (ratio 1.29).        11/07/2015 Pathologic Stage    pT1b,pN0: Stage IA       11/07/2015 Oncotype testing    RS 22, which predicts a year risk of distant recurrence with tamoxifen alone 14% (intermediate-risk). Adjuvant chemo was not offered      02/21/2016 - 04/10/2016 Radiation Therapy    Adjuvant breast radiation. ([name]). Right breast: 50.4 Gy in 28 fractions.  Right breast boost: 12 Gy in 6 fractions.      05/2016 -  Anti-estrogen oral therapy    Femara (Letrozole) 2.5 mg daily.       10/17/2016 Mammogram    MM DIAG BREAST TOMO BILATERAL 10/17/2016 FINDINGS: There are bilateral lumpectomy changes in the upper central left breast and upper central right breast. No mass, nonsurgical distortion, or suspicious microcalcification is identified in either breast  to suggest malignancy. Mammographic images were processed with CAD. IMPRESSION: No evidence of malignancy in either breast. Lumpectomy changes bilaterally. RECOMMENDATION: Diagnostic mammogram is suggested in 1 year. (Code:DM-B-01Y)       Cancer of sigmoid colon ([place])   03/04/2014 Initial Diagnosis    Cancer of sigmoid colon      03/09/2014 Pathology Results    G1 invasive adenocarcinoma, no lymph-Vascular invasion or Peri-neural invasion: Absent. MMR normal, MSI stable.       03/09/2014 Surgery    partial colectomy, negative margins.       11/10/2014 Relapse/Recurrence    biopsy confirmed oligo liver recurrence       11/10/2014 Imaging    PET showed two  small ares of hypermetabolism in the right lobe of the liver, no other distant metastasis.       11/30/2014 Imaging    abdomen MRI showed a new enhancing lesion which corresponds to an area of abnormal hyper metabolism on recent PET-CT. Stable enlarged portal caval lymph node.       01/02/2015 Pathology Results    Liver biopsy showed metastatic adenocarcinoma, IHC (+) for CK20, CDX-2, (-) for TTF-1 and ER      01/02/2015 Miscellaneous    liver biopsy KRAS mutation (+)       02/14/2015 Imaging    CT showed:  the small metastatic right hepatic lobe liver lesion is not identified for certain on this examination. No new lesions. Stable small cysts in segment 4 a.       03/05/2015 - 04/16/2015 Chemotherapy    CAPEOX (capecitabine 2500 mg twice daily Day 1-14, oxaliplatin 1 30 mg/m on day 1, every 21 days, S/P 3 cycles       08/24/2015 Procedure    CT-guided oligo liver metastasis microwave ablation by Dr. [name]      06/17/2016 Imaging    CT of the chest/abd/pelvis with contrast 1. Several new small pulmonary nodules worrisome for metastatic disease. 2. New large area of probable infiltrating recurrent tumor in the right hepatic lobe surrounding the prior ablation site. 3. Small supraclavicular lymph nodes.  Attention on future studies. 4. Stable surgical changes involving both breasts. No breast masses are identified. 5. Stable bilateral adrenal gland nodules. 6. Stable upper abdominal and right-sided retroperitoneal lymph nodes.      07/08/2016 PET scan    1. Large mass in the right lobe of the liver demonstrates diffuse hypermetabolism, compatible with recurrent metastatic disease in the liver 2. Multiple small pulmonary nodules are very similar to the recent Chest CT 06/17/16.  3. No other findings of metastatic disease elsewhere in the neck, chest, abdomen, or pelvis. 4. There is some low-level hypermetabolic activity adjacent to the surgical clips in the lateral aspect of the  right breast at the site of prior lumpectomy.       07/29/2016 Pathology Results    Liver biopsy confirmed metastatic colon cancer       07/31/2016 -  Chemotherapy    FOLFIRI every 2 weeks, Avastin added from cycle 2       10/23/2016 Imaging    CT ABDOMEN PELVIS W CONTRAST 10/24/16 IMPRESSION: 1. The numerous tiny bilateral pulmonary nodules seen on the previous imaging exams are no longer evident, suggesting response of therapy. 2. Large ill-defined irregular right hepatic lesion measures smaller on today's study. 3. Slight increase in size of hepatoduodenal ligament lymphadenopathy. 4. Abdominal Aortic Atherosclerois (ICD10-170.0)      01/05/2017 Imaging    CT CAP IMPRESSION: 1.  Left lower lobe pulmonary nodule and hepatic metastatic disease are stable. 2. Hepatoduodenal ligament lymph node is stable. 3. Aortic atherosclerosis (ICD10-170.0). Coronary artery calcification. 4. Enlarged pulmonary arteries, indicative of pulmonary arterial hypertension. 5. Small bilateral adrenal nodules are unchanged.      05/09/2017 Imaging    CT CAP IMPRESSION: 1. Stable appearance left lower lobe pulmonary nodule with an apparent new tiny posterior right upper lobe pulmonary nodule. 2. Dominant ill-defined right hepatic mass measures smaller on today's study. 3. Slight decrease in the hepatoduodenal ligament lymph node. 4. Aortic Atherosclerois (ICD10-170.0)        CURRENT TREATMENT:   1. Letrozole 2.5 mg once daily, started in September 2017 2. Chemotherapy FOLFIRI and Avastin (from cycle 2) every 2 weeks, started on 07/31/2016, multiple delay per pt's request; chemo break from 5/30-6/21/18; decreased irinotecan to 127m/m2, 5-fu 20034mm2 from 03/05/17, due to poor tolerance.   INTERIM HISTORY:  [age]TRENACE COU[age]IN56.o. female with a history of recurrent left breast cancer, colon cancer s/p laparoscopic-assisted partial colectomy (03/09/2014), PE (01/06/2014), and right breast cancer  (01/2015),  presents to clinic for follow up.   She's been doing well lately. Her last treatment was about 5 weeks ago, chemotherapy was postponed due to her travel arrangement after last treatment. Her right upper quadrant abdominal pain has improved, she is taking Tylenol No. 4 less than before. Her appetite and energy level are decent. She came in today for chemotherapy, however due to the [place] had to [place], and [place] and she wants to hold on chemotherapy today. She has no other complaints.   MEDICAL HISTORY: Past Medical History:  Diagnosis Date  . Anxiety   . Breast CA ([place]   radiation and surgery-lt.  . Cancer ([place]   left breast cancer x2  . Hypertension   . Liver lesion   . Pulmonary embolism ([place]   "blood clot in lungs" -dx. 4'15 with CT Chest. On Xarelto  . Radiation 01/22/10-03/12/10   left whole breast 4500 cGy, upper aspect boosted to 6120 cGy  . Radiation 02/21/16 - 04/10/16   right breast 50.4 Gy, right breast boost 12 Gy  . Shortness of breath    sob with exertion. dx. with Pulmonary emboli 4'15 ,tx. Xarelto,Lovenox used for [employer]   ALLERGIES:  is allergic to tramadol.  MEDICATIONS:  Allergies as of 05/28/2017      Reactions   Tramadol Other (See Comments)   Dizziness      Medication List       Accurate as of 05/28/17  1:10 PM. Always use your most recent med list.          acetaminophen-codeine 300-60 MG tablet Commonly known as:  TYLENOL #4 Take 1 tablet by mouth every 8 (eight) hours as needed. for pain   amLODipine 5 MG tablet Commonly known as:  NORVASC Take 1 tablet (5 mg total) by mouth daily.   dexamethasone 4 MG tablet Commonly known as:  DECADRON Take 1 tablet (4 mg total) by mouth daily.   ferrous sulfate 325 (65 FE) MG tablet Take 325 mg by mouth daily with breakfast. Pt takes  4 times per week.   hydrochlorothiazide 25 MG tablet Commonly known as:   HYDRODIURIL Take 1 tablet (25 mg total) by mouth every morning.   HYDROcodone-acetaminophen 5-325 MG tablet Commonly known as:  NORCO Take 1 tablet by mouth every 8 (eight)  hours as needed for moderate pain.   letrozole 2.5 MG tablet Commonly known as:  FEMARA TAKE 1 TABLET BY MOUTH EVERY DAY   lidocaine-prilocaine cream Commonly known as:  EMLA Apply 1 application topically as needed.   LORazepam 0.5 MG tablet Commonly known as:  ATIVAN Take 1 tablet (0.5 mg total) by mouth every 8 (eight) hours.   magic mouthwash w/lidocaine Soln Take 5 mLs by mouth 4 (four) times daily as needed for mouth pain.   metoprolol tartrate 50 MG tablet Commonly known as:  LOPRESSOR Take 1 tablet (50 mg total) by mouth 2 (two) times daily.   ondansetron 8 MG tablet Commonly known as:  ZOFRAN Take 1 tablet (8 mg total) by mouth 2 (two) times daily as needed for refractory nausea / vomiting. Start on day 3 after chemotherapy.   potassium chloride SA 20 MEQ tablet Commonly known as:  K-DUR,KLOR-CON TAKE 1 TABLET BY MOUTH 2 TIMES DAILY   prochlorperazine 10 MG tablet Commonly known as:  COMPAZINE Take 1 tablet (10 mg total) by mouth every 6 (six) hours as needed (NAUSEA).   promethazine 25 MG tablet Commonly known as:  PHENERGAN Take 1 tablet (25 mg total) by mouth every 6 (six) hours as needed for nausea or vomiting.   rivaroxaban 20 MG Tabs tablet Commonly known as:  XARELTO Take 1 tablet (20 mg total) by mouth daily.   zolpidem 5 MG tablet Commonly known as:  AMBIEN Take 1 tablet (5 mg total) by mouth at bedtime as needed for sleep.       SURGICAL HISTORY:  Past Surgical History:  Procedure Laterality Date  . BREAST LUMPECTOMY WITH RADIOACTIVE SEED AND SENTINEL LYMPH NODE BIOPSY Right 11/07/2015   Procedure: BREAST LUMPECTOMY WITH RADIOACTIVE SEED AND SENTINEL LYMPH NODE BIOPSY;  Surgeon: Stark Klein, MD;  Location: [facility];  Service: General;  Laterality: Right;   . BREAST SURGERY     '11- Left breast lumpectomy  . CESAREAN SECTION    . CHOLECYSTECTOMY     Laparoscopic 20 yrs ago  . COLON SURGERY  03-09-14   partial colectomy for cancer  . IR GENERIC HISTORICAL  07/29/2016   IR FLUORO GUIDE PORT INSERTION RIGHT 07/29/2016 Sandi Mariscal, MD [facility]  . IR GENERIC HISTORICAL  07/29/2016   IR US GUIDE VASC ACCESS RIGHT 07/29/2016 Sandi Mariscal, MD [facility]  . IR GENERIC HISTORICAL  07/29/2016   IR US GUIDE BX ASP/DRAIN 07/29/2016 [facility]  . PARTIAL COLECTOMY N/A 03/09/2014   Procedure: LAPAROSCOPIC ASSISTED PARTIAL COLECTOMY, splenic flexure mobilization;  Surgeon: Leighton Ruff, MD;  Location[facility];  Service: General;  Laterality: N/A;  . RADIOFREQUENCY ABLATION Right 08/24/2015   Procedure: MICROWAVE ABLATION RIGHT LIVER LOBE ;  Surgeon: Corrie Mckusick, DO;  Location: [facility];  Service: Anesthesiology;  Laterality: Right;    REVIEW OF SYSTEMS:  Constitutional: Denies fevers, chills or abnormal weight loss (+) moderately fatigued Eyes: Denies blurriness of vision Ears, nose, mouth, throat, and face: Denies mucositis or sore throat Respiratory: Denies cough, dyspnea or wheezes Cardiovascular: Denies palpitation, chest discomfort or lower extremity swelling Gastrointestinal:  Denies heartburn or change in bowel habits.  Skin: Denies abnormal skin rashes Musculoskeletal: (+) intermittent right-sided flank pain  Lymphatics: Denies new lymphadenopathy or easy bruising Neurological:Denies numbness, tingling or new weaknesses (+)numbness in right foot Musculoskeletal: normal Behavioral/Psych: Mood is stable, no new changes  All other systems were reviewed with the patient and are negative.    PHYSICAL EXAMINATION:  ECOG PERFORMANCE STATUS: 1-2  Blood pressure 136/85, pulse (!) 56, temperature 99.1 F (37.3 C), temperature source Oral, resp. rate 18, height '5\' 6"'  (1.676 m), weight 272 lb 9.6 oz (123.7 kg), last menstrual period 01/23/2014,  SpO2 100 %.  Vitals:   05/28/17 1215  BP: 136/85  Pulse: (!) 56  Resp: 18  Temp: 99.1 F (37.3 C)  TempSrc: Oral  SpO2: 100%  Weight: 272 lb 9.6 oz (123.7 kg)  Height: '5\' 6"'  (1.676 m)    GENERAL:alert, no distress and comfortable; well developed and obese. Easily mobile to exam table.  SKIN: skin color, texture, turgor are normal, no rashes or significant lesions EYES: normal, Conjunctiva are pink and non-injected, sclera clear OROPHARYNX:no exudate, no erythema and lips, buccal mucosa, and tongue normal  NECK: supple, thyroid normal size, non-tender, without nodularity LYMPH:  no palpable lymphadenopathy in the cervical, axillary or supraclavicular LUNGS: clear to auscultation with normal breathing effort, no wheezes or rhonchi HEART: regular rate & rhythm and no murmurs and no lower extremity edema ABDOMEN:abdomen soft, non-tender and normal bowel sounds; incision healing with signs of infection.  Mild tenderness at the  Right flank area, no abdominal tenderness. No local megaly Musculoskeletal:no cyanosis of digits and no clubbing, limited ROM of left shoulder  NEURO: alert & oriented x 3 with fluent speech, no focal motor/sensory deficits  Labs:  CBC Latest Ref Rng & Units 05/28/2017 05/07/2017 04/23/2017  WBC 3.9 - 10.3 10e3/uL 8.9 6.7 7.1  Hemoglobin 11.6 - 15.9 g/dL 13.1 13.4 12.5  Hematocrit 34.8 - 46.6 % 39.0 39.8 37.7  Platelets 145 - 400 10e3/uL 230 301 248    CMP Latest Ref Rng & Units 05/28/2017 05/07/2017 04/23/2017  Glucose 70 - 140 mg/dl 93 109 94  BUN 7.0 - 26.0 mg/dL 10.7 6.0(L) 7.4  Creatinine 0.6 - 1.1 mg/dL 0.8 0.8 0.8  Sodium 136 - 145 mEq/L 141 140 141  Potassium 3.5 - 5.1 mEq/L 3.7 3.9 3.5  Chloride 101 - 111 mmol/L - - -  CO2 22 - 29 mEq/L '25 26 28  ' Calcium 8.4 - 10.4 mg/dL 9.8 10.0 9.6  Total Protein 6.4 - 8.3 g/dL 7.4 7.8 7.0  Total Bilirubin 0.20 - 1.20 mg/dL 0.59 0.42 0.59  Alkaline Phos 40 - 150 U/L 43 48 44  AST 5 - 34 U/L 29 41(H) 39(H)  ALT 0 -  55 U/L '19 25 31    ' CEA  01/18/2014: 0.8 03/01/2015: 1.3 01/31/2016: 4.8 05/28/2016: 48 07/31/2016: 173.7 08/28/16: 139 09/25/2016: 43 11/04/16: 9.94 12/18/2016: 6.99 01/29/17: 4.58 03/04/17: 6.09 04/09/17: 4.22   PATHOLOGY RESULT Diagnosis 01/02/2015 Liver, needle/core biopsy, right - POSITIVE FOR METASTATIC ADENOCARCINOMA. - SEE COMMENT.   Diagnosis 11/07/2015 1. Breast, lumpectomy, Right - INVASIVE DUCTAL CARCINOMA, GRADE 2, SPANNING 1 CM. - DUCTAL CARCINOMA IN SITU, LOW GRADE. - RESECTION MARGINS ARE NEGATIVE FOR INVASIVE CARCINOMA. - DUCTAL CARCINOMA IN SITU COMES TO WITHIN 0.3 CM OF THE POSTERIOR MARGIN. - BIOPSY SITE. - SEE ONCOLOGY TABLE. 2. Lymph node, sentinel, biopsy, right axillary #1 - ONE OF ONE LYMPH NODES NEGATIVE FOR CARCINOMA (0/1). 3. Lymph node, sentinel, biopsy, right axillary #2 - ONE OF ONE LYMPH NODES NEGATIVE FOR CARCINOMA (0/1). 4. Lymph node, sentinel, biopsy, right axillary #3 - ONE OF ONE LYMPH NODES NEGATIVE FOR CARCINOMA (0/1).  ONCOTYPE DX: RS 85, which predicts a year risk of distant recurrence with tamoxifen alone 14%   Diagnosis 07/29/2016 Liver, needle/core biopsy, posterior of right lobe METASTATIC ADENOCARCINOMA, CONSISTENT WITH  COLONIC PRIMARY.  RADIOLOGY STUDIES   PET 07/08/2016 IMPRESSION: 1. Large mass in the right lobe of the liver demonstrates diffuse hypermetabolism, compatible with recurrent metastatic disease in the liver. 2. Multiple small pulmonary nodules are very similar to the recent chest CT 06/17/2016. These do not demonstrate hypermetabolism on the PET images, however, these lesions are well below the level of PET imaging. Given that these nodules are new compared to prior chest CT 12/18/2015, they are concerning for potential metastatic disease to the lungs, and continued attention on followup studies is recommended. 3. No other findings of metastatic disease elsewhere in the neck, chest, abdomen or pelvis. 4. There is  some low-level hypermetabolic activity adjacent to the surgical clips in the lateral aspect of the right breast at the site of prior lumpectomy. This area has undergone interval involution over the several prior examinations, and this low-level activity is favored to be related to normal healing. Attention on follow-up imaging is recommended to ensure the stability or resolution of this finding. 5. Aortic atherosclerosis, in addition to 2 vessel coronary artery disease. Please note that although the presence of coronary artery calcium documents the presence of coronary artery disease, the severity of this disease and any potential stenosis cannot be assessed on this non-gated CT examination. Assessment for potential risk factor modification, dietary therapy or pharmacologic therapy may be warranted, if clinically indicated. 6. Additional incidental findings, as above.  MM DIAG BREAST TOMO BILATERAL 10/17/2016 FINDINGS: There are bilateral lumpectomy changes in the upper central left breast and upper central right breast. No mass, nonsurgical distortion, or suspicious microcalcification is identified in either breast to suggest malignancy. Mammographic images were processed with CAD. IMPRESSION: No evidence of malignancy in either breast. Lumpectomy changes bilaterally. RECOMMENDATION: Diagnostic mammogram is suggested in 1 year. (Code:DM-B-01Y)  CT CHEST, ABDOMEN PELVIS W CONTRAST 10/24/16 IMPRESSION: 1. The numerous tiny bilateral pulmonary nodules seen on the previous imaging exams are no longer evident, suggesting response of therapy. 2. Large ill-defined irregular right hepatic lesion measures smaller on today's study. 3. Slight increase in size of hepatoduodenal ligament lymphadenopathy. 4. Abdominal Aortic Atherosclerois (ICD10-170.0)  CT CAP 01/05/17 IMPRESSION: 1. Left lower lobe pulmonary nodule and hepatic metastatic disease are stable. 2. Hepatoduodenal ligament lymph node is  stable. 3. Aortic atherosclerosis (ICD10-170.0). Coronary artery calcification. 4. Enlarged pulmonary arteries, indicative of pulmonary arterial hypertension. 5. Small bilateral adrenal nodules are unchanged.  CT CAP 05/09/17 IMPRESSION: 1. Stable appearance left lower lobe pulmonary nodule with an apparent new tiny posterior right upper lobe pulmonary nodule. Continued attention on follow-up recommended. 2. Dominant ill-defined right hepatic mass measures smaller on today's study. 3. Slight decrease in the hepatoduodenal ligament lymph node. 4.  Aortic Atherosclerois (ICD10-170.0)  ASSESSMENT: Veronica Reyes 62 y.o. female   1. Colon Cancer, Stage I (pT2N0), MMR normal, oligo recurrent disease in liver in 12/2014, KRAS mutation (+), liver and lung recurrence in 06/2016 -I previously reviewed the nature history of metastatic colon cancer, and the potential curative approach with chemotherapy followed by liver lesion ablation or resection. However, more likely, this is not curable disease. -She initially received 3 cycles of [place], does not want to proceed with fourth cycle due to the moderate side effects from previous chemotherapy treatment. She declined liver mass resection, and underwent liver ablation by interventional radiologist Dr. Earleen Newport in 08/2015. - I previously reviewed her restaging CT scan from 06/17/2016, which unfortunately showed new area of infiltrative disease as the previous ablated liver lesion, likely recurrence. She  also developed several new pulmonary nodules, small, concerning for metastatic disease. 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 --She has started chemotherapy FOLFIRI and avastin, has been tolerating moderately well, we'll continue every 2 weeks. -Her CEA has significantly decreased since she started chemotherapy, likely responding to her good response to chemotherapy. -Repeat CT scan on  10/23/16 showed resolution of numerous bilateral pulmonary nodules, the right hepatic lesion is smaller, and slight decrease in size of hepatoduodenal ligament lymphadenopathy. No other new lesions  -I previously reviewed her restaging CT scan from 01/05/2017, which showed stable pulmonary and liver metastasis, no other new lesions. -she has been tolerating chemo poorly overall, I have decreased her chemo dose a few times. She has also frequently requested chemotherapy break due to multiple reasons. -Due to her worsening nausea, I'll add IV Emend before chemotherapy today, and I previously called in Phenergan 25 mg as needed for her nausea at home. She uses Compazine also. She doesn't respond well to Zofran. -We reviewed her restaging CT scan from 05/09/2017, which showed continuous response to treatment, her liver metastasis has slightly decreased in size, no other new liver lesions. She still has a few small pulmonary nodules, we'll continue monitoring. -Per patient's request, I'll postpone her chemotherapy from today to next week. Patient is willing to stay on every two-week schedule from now on. -Lab reviewed, CBC and CMP are unremarkable, CA still pending.  2. Right breast cancer, pT1bN0M0, stage Ia, ER+/PR+/HER2-, History of left Breast Cancer, diagnosed on 02/01/2015 --Left CIS in situ, s/p lumpectomy in 2006 and 2011 by Dr. Margot Chimes, and radiation in 2011 by Dr. Sondra Come.  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 Surgical margins were negative. -I previously reviewed her Oncotype DX result. Her recurrence score is 22, which predicts 14% 10 year risk of distant recurrence with tamoxifen alone. This is intermediate risk, based on her relatively low number, I do not recommend adjuvant chemotherapy. -She has completed adjuvant breast radiation -her [place] level supports she is postmenopausal -She has started adjuvant  letrozole, tolerating well, we'll continue. -We previously discussed breast cancer surveillance, I strongly encouraged her to continue annual screening mammogram, self exam, and follow-up with [place] routinely. -her recent mammogram was normal 10/2016  3. History of PE (01/06/2014) --She has a diagnosis of pulmonary embolism, likely secondary to malignancy in the setting of family history for stroke (and possible stroke history in patient as well). Lower extremity dopplers negative.  -continue Xarelto. Due to her metastatic colon cancer, I previously recommended her to continue indefinitely.  4.  Uterine Fibroids  --Negative endometrial biopsy. Following with Gynecology.  5. Kidney lesion (Right). -No hypermetabolic right kidney lesion on the pet scan -Repeat abdominal MRI 05/29/2015 showed unchanged right renal lesion, favored to represent a complex/septated cyst.  6. Right flank pain - possible related to her liver metastasis -improved with chemo, seems to be worse lately  -She'll continue hydrocodone as needed, I encouraged her to take as needed to better control her pain. -She uses Hydrocortisone and would like a refill today -Continue Tylenol No. 4 as needed for pain. Her abdominal pain overall has improved lately.  7.  Morbid obesity - I have previously encouraged her to eat healthy and exercise more.  8. HTN  -She will continue medication and following up with her PCP -We discussed that Avastin can increase her blood pressure, I  recommend her to continue monitoring at home   9. Nose bleeds - I have previously  discussed the patient her nosebleeds could be related to Avastin  -she knows to avoid blowing nose    Plan  - lab and scan reviewed -hold treatment today per patient's request, and rescheduled to next week. -Lab, port flush, follow-up and chemotherapy FOLFIRI and Avastin in 3 weeks   All questions were answered. The patient knows to call the clinic with any problems,  questions or concerns. We can certainly see the patient much sooner if necessary.  I spent 20 minutes counseling the patient face to face. The total time spent in the appointment was 25 minutes.  This document serves as a record of services personally performed by [name], MD. It was created on her behalf by Arlyce Harman, a trained medical scribe. The creation of this record is based on the scribe's personal observations and the provider's statements to them. This document has been checked and approved by the attending provider.  I have reviewed the above documentation for accuracy and completeness, and I agree with the above information.      [name]  05/28/2017

## 2017-05-28 ENCOUNTER — Other Ambulatory Visit (HOSPITAL_BASED_OUTPATIENT_CLINIC_OR_DEPARTMENT_OTHER): Payer: BLUE CROSS/BLUE SHIELD

## 2017-05-28 ENCOUNTER — Ambulatory Visit: Payer: BLUE CROSS/BLUE SHIELD

## 2017-05-28 ENCOUNTER — Encounter: Payer: Self-pay | Admitting: Hematology

## 2017-05-28 ENCOUNTER — Ambulatory Visit (HOSPITAL_BASED_OUTPATIENT_CLINIC_OR_DEPARTMENT_OTHER): Payer: BLUE CROSS/BLUE SHIELD | Admitting: Hematology

## 2017-05-28 ENCOUNTER — Telehealth: Payer: Self-pay | Admitting: Hematology

## 2017-05-28 VITALS — BP 136/85 | HR 56 | Temp 99.1°F | Resp 18 | Ht 66.0 in | Wt 272.6 lb

## 2017-05-28 DIAGNOSIS — Z170 Estrogen receptor positive status [ER+]: Secondary | ICD-10-CM

## 2017-05-28 DIAGNOSIS — C787 Secondary malignant neoplasm of liver and intrahepatic bile duct: Secondary | ICD-10-CM

## 2017-05-28 DIAGNOSIS — I10 Essential (primary) hypertension: Secondary | ICD-10-CM

## 2017-05-28 DIAGNOSIS — C187 Malignant neoplasm of sigmoid colon: Secondary | ICD-10-CM | POA: Diagnosis not present

## 2017-05-28 DIAGNOSIS — Z86718 Personal history of other venous thrombosis and embolism: Secondary | ICD-10-CM

## 2017-05-28 DIAGNOSIS — C50511 Malignant neoplasm of lower-outer quadrant of right female breast: Secondary | ICD-10-CM | POA: Diagnosis not present

## 2017-05-28 DIAGNOSIS — E66813 Obesity, class 3: Secondary | ICD-10-CM

## 2017-05-28 DIAGNOSIS — Z95828 Presence of other vascular implants and grafts: Secondary | ICD-10-CM

## 2017-05-28 LAB — CBC WITH DIFFERENTIAL/PLATELET
BASO%: 0.8 % (ref 0.0–2.0)
BASOS ABS: 0.1 10*3/uL (ref 0.0–0.1)
EOS ABS: 0.1 10*3/uL (ref 0.0–0.5)
EOS%: 0.8 % (ref 0.0–7.0)
HCT: 39 % (ref 34.8–46.6)
HEMOGLOBIN: 13.1 g/dL (ref 11.6–15.9)
LYMPH#: 2.9 10*3/uL (ref 0.9–3.3)
LYMPH%: 32.2 % (ref 14.0–49.7)
MCH: 31.3 pg (ref 25.1–34.0)
MCHC: 33.5 g/dL (ref 31.5–36.0)
MCV: 93.4 fL (ref 79.5–101.0)
MONO#: 0.7 10*3/uL (ref 0.1–0.9)
MONO%: 7.7 % (ref 0.0–14.0)
NEUT%: 58.5 % (ref 38.4–76.8)
NEUTROS ABS: 5.2 10*3/uL (ref 1.5–6.5)
PLATELETS: 230 10*3/uL (ref 145–400)
RBC: 4.18 10*6/uL (ref 3.70–5.45)
RDW: 16.5 % — AB (ref 11.2–14.5)
WBC: 8.9 10*3/uL (ref 3.9–10.3)

## 2017-05-28 LAB — COMPREHENSIVE METABOLIC PANEL
ALT: 19 U/L (ref 0–55)
ANION GAP: 9 meq/L (ref 3–11)
AST: 29 U/L (ref 5–34)
Albumin: 3.1 g/dL — ABNORMAL LOW (ref 3.5–5.0)
Alkaline Phosphatase: 43 U/L (ref 40–150)
BUN: 10.7 mg/dL (ref 7.0–26.0)
CHLORIDE: 108 meq/L (ref 98–109)
CO2: 25 meq/L (ref 22–29)
Calcium: 9.8 mg/dL (ref 8.4–10.4)
Creatinine: 0.8 mg/dL (ref 0.6–1.1)
EGFR: >90 mL/min/{1.73_m2} (ref >90)
Glucose: 93 mg/dl (ref 70–140)
Potassium: 3.7 mEq/L (ref 3.5–5.1)
Sodium: 141 mEq/L (ref 136–145)
Total Bilirubin: 0.59 mg/dL (ref 0.20–1.20)
Total Protein: 7.4 g/dL (ref 6.4–8.3)

## 2017-05-28 MED ORDER — SODIUM CHLORIDE 0.9 % IJ SOLN
10.0000 mL | Freq: Once | INTRAMUSCULAR | Status: AC
  Administered 2017-05-28: 10 mL
  Filled 2017-05-28: qty 10

## 2017-05-28 MED ORDER — SODIUM CHLORIDE 0.9% FLUSH
10.0000 mL | INTRAVENOUS | Status: DC | PRN
  Administered 2017-05-28: 10 mL via INTRAVENOUS
  Filled 2017-05-28: qty 10

## 2017-05-28 MED ORDER — HEPARIN SOD (PORK) LOCK FLUSH 100 UNIT/ML IV SOLN
500.0000 [IU] | Freq: Once | INTRAVENOUS | Status: AC
  Administered 2017-05-28: 500 [IU]
  Filled 2017-05-28: qty 5

## 2017-05-28 NOTE — Telephone Encounter (Signed)
Scheduled appt per 9/13 los - per pt  No avs or calender needed - patient is my chart active.

## 2017-06-03 ENCOUNTER — Other Ambulatory Visit: Payer: Self-pay | Admitting: Hematology

## 2017-06-03 DIAGNOSIS — Z170 Estrogen receptor positive status [ER+]: Principal | ICD-10-CM

## 2017-06-03 DIAGNOSIS — C50511 Malignant neoplasm of lower-outer quadrant of right female breast: Secondary | ICD-10-CM

## 2017-06-04 ENCOUNTER — Other Ambulatory Visit (HOSPITAL_BASED_OUTPATIENT_CLINIC_OR_DEPARTMENT_OTHER): Payer: BLUE CROSS/BLUE SHIELD

## 2017-06-04 ENCOUNTER — Ambulatory Visit (HOSPITAL_BASED_OUTPATIENT_CLINIC_OR_DEPARTMENT_OTHER): Payer: BLUE CROSS/BLUE SHIELD

## 2017-06-04 ENCOUNTER — Other Ambulatory Visit: Payer: Self-pay | Admitting: *Deleted

## 2017-06-04 VITALS — BP 143/84 | HR 58 | Temp 98.3°F | Resp 18

## 2017-06-04 DIAGNOSIS — Z170 Estrogen receptor positive status [ER+]: Principal | ICD-10-CM

## 2017-06-04 DIAGNOSIS — I10 Essential (primary) hypertension: Secondary | ICD-10-CM

## 2017-06-04 DIAGNOSIS — C787 Secondary malignant neoplasm of liver and intrahepatic bile duct: Secondary | ICD-10-CM

## 2017-06-04 DIAGNOSIS — C187 Malignant neoplasm of sigmoid colon: Secondary | ICD-10-CM

## 2017-06-04 DIAGNOSIS — C50511 Malignant neoplasm of lower-outer quadrant of right female breast: Secondary | ICD-10-CM | POA: Diagnosis not present

## 2017-06-04 DIAGNOSIS — Z5111 Encounter for antineoplastic chemotherapy: Secondary | ICD-10-CM | POA: Diagnosis not present

## 2017-06-04 DIAGNOSIS — Z5112 Encounter for antineoplastic immunotherapy: Secondary | ICD-10-CM | POA: Diagnosis not present

## 2017-06-04 DIAGNOSIS — Z79899 Other long term (current) drug therapy: Secondary | ICD-10-CM

## 2017-06-04 LAB — CBC WITH DIFFERENTIAL/PLATELET
BASO%: 0.2 % (ref 0.0–2.0)
Basophils Absolute: 0 10*3/uL (ref 0.0–0.1)
EOS%: 1.1 % (ref 0.0–7.0)
Eosinophils Absolute: 0.1 10*3/uL (ref 0.0–0.5)
HCT: 40 % (ref 34.8–46.6)
HGB: 13.1 g/dL (ref 11.6–15.9)
LYMPH%: 31.3 % (ref 14.0–49.7)
MCH: 31.1 pg (ref 25.1–34.0)
MCHC: 32.8 g/dL (ref 31.5–36.0)
MCV: 95 fL (ref 79.5–101.0)
MONO#: 0.7 10*3/uL (ref 0.1–0.9)
MONO%: 7.3 % (ref 0.0–14.0)
NEUT%: 60.1 % (ref 38.4–76.8)
NEUTROS ABS: 5.4 10*3/uL (ref 1.5–6.5)
Platelets: 229 10*3/uL (ref 145–400)
RBC: 4.21 10*6/uL (ref 3.70–5.45)
RDW: 15.8 % — ABNORMAL HIGH (ref 11.2–14.5)
WBC: 8.9 10*3/uL (ref 3.9–10.3)
lymph#: 2.8 10*3/uL (ref 0.9–3.3)

## 2017-06-04 LAB — COMPREHENSIVE METABOLIC PANEL
ALK PHOS: 46 U/L (ref 40–150)
ALT: 23 U/L (ref 0–55)
ANION GAP: 10 meq/L (ref 3–11)
AST: 44 U/L — ABNORMAL HIGH (ref 5–34)
Albumin: 3.3 g/dL — ABNORMAL LOW (ref 3.5–5.0)
BILIRUBIN TOTAL: 0.63 mg/dL (ref 0.20–1.20)
BUN: 11.5 mg/dL (ref 7.0–26.0)
CO2: 25 meq/L (ref 22–29)
Calcium: 10 mg/dL (ref 8.4–10.4)
Chloride: 108 mEq/L (ref 98–109)
Creatinine: 0.8 mg/dL (ref 0.6–1.1)
Glucose: 108 mg/dl (ref 70–140)
Potassium: 3.8 mEq/L (ref 3.5–5.1)
Sodium: 142 mEq/L (ref 136–145)
TOTAL PROTEIN: 8 g/dL (ref 6.4–8.3)

## 2017-06-04 LAB — CEA (IN HOUSE-CHCC): CEA (CHCC-IN HOUSE): 4.12 ng/mL (ref 0.00–5.00)

## 2017-06-04 LAB — UA PROTEIN, DIPSTICK - CHCC: Protein, ur: NEGATIVE mg/dL

## 2017-06-04 MED ORDER — PALONOSETRON HCL INJECTION 0.25 MG/5ML
0.2500 mg | Freq: Once | INTRAVENOUS | Status: AC
  Administered 2017-06-04: 0.25 mg via INTRAVENOUS

## 2017-06-04 MED ORDER — SODIUM CHLORIDE 0.9% FLUSH
10.0000 mL | INTRAVENOUS | Status: DC | PRN
  Filled 2017-06-04: qty 10

## 2017-06-04 MED ORDER — ATROPINE SULFATE 1 MG/ML IJ SOLN
0.5000 mg | Freq: Once | INTRAMUSCULAR | Status: AC | PRN
  Administered 2017-06-04: 0.5 mg via INTRAVENOUS

## 2017-06-04 MED ORDER — ATROPINE SULFATE 1 MG/ML IJ SOLN
INTRAMUSCULAR | Status: AC
  Filled 2017-06-04: qty 1

## 2017-06-04 MED ORDER — SODIUM CHLORIDE 0.9 % IV SOLN
4.7000 mg/kg | Freq: Once | INTRAVENOUS | Status: AC
  Administered 2017-06-04: 600 mg via INTRAVENOUS
  Filled 2017-06-04: qty 16

## 2017-06-04 MED ORDER — LEUCOVORIN CALCIUM INJECTION 350 MG
400.0000 mg/m2 | Freq: Once | INTRAVENOUS | Status: AC
  Administered 2017-06-04: 976 mg via INTRAVENOUS
  Filled 2017-06-04: qty 48.8

## 2017-06-04 MED ORDER — HYDROCHLOROTHIAZIDE 25 MG PO TABS: 25.0000 mg | ORAL_TABLET | ORAL | 0 refills | Status: DC

## 2017-06-04 MED ORDER — PROCHLORPERAZINE MALEATE 10 MG PO TABS
10.0000 mg | ORAL_TABLET | Freq: Once | ORAL | Status: AC | PRN
  Administered 2017-06-04: 10 mg via ORAL

## 2017-06-04 MED ORDER — SODIUM CHLORIDE 0.9 % IV SOLN
2000.0000 mg/m2 | INTRAVENOUS | Status: DC
  Administered 2017-06-04: 4900 mg via INTRAVENOUS
  Filled 2017-06-04: qty 98

## 2017-06-04 MED ORDER — HYDROCODONE-ACETAMINOPHEN 5-325 MG PO TABS: 1.0000 | ORAL_TABLET | Freq: Three times a day (TID) | ORAL | 0 refills | Status: DC | PRN

## 2017-06-04 MED ORDER — FOSAPREPITANT DIMEGLUMINE INJECTION 150 MG
Freq: Once | INTRAVENOUS | Status: AC
  Administered 2017-06-04: 14:00:00 via INTRAVENOUS
  Filled 2017-06-04: qty 5

## 2017-06-04 MED ORDER — PROCHLORPERAZINE MALEATE 10 MG PO TABS
ORAL_TABLET | ORAL | Status: AC
  Filled 2017-06-04: qty 1

## 2017-06-04 MED ORDER — PALONOSETRON HCL INJECTION 0.25 MG/5ML
INTRAVENOUS | Status: AC
  Filled 2017-06-04: qty 5

## 2017-06-04 MED ORDER — SODIUM CHLORIDE 0.9 % IV SOLN
Freq: Once | INTRAVENOUS | Status: AC
  Administered 2017-06-04: 12:00:00 via INTRAVENOUS

## 2017-06-04 MED ORDER — AMLODIPINE BESYLATE 5 MG PO TABS: 5.0000 mg | ORAL_TABLET | Freq: Every day | ORAL | 0 refills | Status: DC

## 2017-06-04 MED ORDER — DEXTROSE 5 % IV SOLN
140.0000 mg/m2 | Freq: Once | INTRAVENOUS | Status: AC
  Administered 2017-06-04: 340 mg via INTRAVENOUS
  Filled 2017-06-04: qty 15

## 2017-06-04 MED ORDER — METOPROLOL TARTRATE 50 MG PO TABS: 50.0000 mg | ORAL_TABLET | Freq: Two times a day (BID) | ORAL | 0 refills | Status: DC

## 2017-06-04 NOTE — Patient Instructions (Signed)
[facility] Discharge Instructions for Patients Receiving Chemotherapy  Today you received the following chemotherapy agents: Avastin, Irinotecan and Leucovorin   To help prevent nausea and vomiting after your treatment, we encourage you to take your nausea medication as directed.    If you develop nausea and vomiting that is not controlled by your nausea medication, call the clinic.   BELOW ARE SYMPTOMS THAT SHOULD BE REPORTED IMMEDIATELY:  *FEVER GREATER THAN 100.5 F  *CHILLS WITH OR WITHOUT FEVER  NAUSEA AND VOMITING THAT IS NOT CONTROLLED WITH YOUR NAUSEA MEDICATION  *UNUSUAL SHORTNESS OF BREATH  *UNUSUAL BRUISING OR BLEEDING  TENDERNESS IN MOUTH AND THROAT WITH OR WITHOUT PRESENCE OF ULCERS  *URINARY PROBLEMS  *BOWEL PROBLEMS  UNUSUAL RASH Items with * indicate a potential emergency and should be followed up as soon as possible.  Feel free to call the clinic you have any questions or concerns. The clinic phone number is [phone].  Please show the Box at check-in to the Emergency Department and triage nurse.

## 2017-06-04 NOTE — Progress Notes (Signed)
1217: Pt reports intermittent pain in right shoulder with head movement. Pt states she has been moving and sleeping on floor. [name] RN aware and pt educated to use tylenol and heat as needed and to call clinic if symptoms persist or worsen. Pt verbalizes understanding.   OK to increase home pump infusion rate to 3.4 ml/hr and disconnect pump on 06/06/17 at 1230 per Dr. Burr Medico.

## 2017-06-06 ENCOUNTER — Ambulatory Visit (HOSPITAL_BASED_OUTPATIENT_CLINIC_OR_DEPARTMENT_OTHER): Payer: BLUE CROSS/BLUE SHIELD

## 2017-06-06 VITALS — BP 144/87 | HR 77 | Temp 99.1°F | Resp 20

## 2017-06-06 DIAGNOSIS — C187 Malignant neoplasm of sigmoid colon: Secondary | ICD-10-CM

## 2017-06-06 MED ORDER — SODIUM CHLORIDE 0.9% FLUSH
10.0000 mL | INTRAVENOUS | Status: DC | PRN
  Administered 2017-06-06: 10 mL
  Filled 2017-06-06: qty 10

## 2017-06-06 MED ORDER — HEPARIN SOD (PORK) LOCK FLUSH 100 UNIT/ML IV SOLN
500.0000 [IU] | Freq: Once | INTRAVENOUS | Status: AC | PRN
  Administered 2017-06-06: 500 [IU]
  Filled 2017-06-06: qty 5

## 2017-06-06 NOTE — Patient Instructions (Signed)

## 2017-06-18 ENCOUNTER — Other Ambulatory Visit (HOSPITAL_BASED_OUTPATIENT_CLINIC_OR_DEPARTMENT_OTHER): Payer: BLUE CROSS/BLUE SHIELD

## 2017-06-18 ENCOUNTER — Encounter: Payer: Self-pay | Admitting: Nurse Practitioner

## 2017-06-18 ENCOUNTER — Telehealth: Payer: Self-pay | Admitting: Hematology

## 2017-06-18 ENCOUNTER — Ambulatory Visit (HOSPITAL_BASED_OUTPATIENT_CLINIC_OR_DEPARTMENT_OTHER): Payer: BLUE CROSS/BLUE SHIELD

## 2017-06-18 ENCOUNTER — Ambulatory Visit (HOSPITAL_BASED_OUTPATIENT_CLINIC_OR_DEPARTMENT_OTHER): Payer: BLUE CROSS/BLUE SHIELD | Admitting: Nurse Practitioner

## 2017-06-18 ENCOUNTER — Ambulatory Visit: Payer: BLUE CROSS/BLUE SHIELD

## 2017-06-18 VITALS — BP 125/77 | HR 64

## 2017-06-18 DIAGNOSIS — Z5112 Encounter for antineoplastic immunotherapy: Secondary | ICD-10-CM | POA: Diagnosis not present

## 2017-06-18 DIAGNOSIS — C787 Secondary malignant neoplasm of liver and intrahepatic bile duct: Secondary | ICD-10-CM

## 2017-06-18 DIAGNOSIS — Z5111 Encounter for antineoplastic chemotherapy: Secondary | ICD-10-CM | POA: Diagnosis not present

## 2017-06-18 DIAGNOSIS — C187 Malignant neoplasm of sigmoid colon: Secondary | ICD-10-CM | POA: Diagnosis not present

## 2017-06-18 DIAGNOSIS — Z86711 Personal history of pulmonary embolism: Secondary | ICD-10-CM | POA: Diagnosis not present

## 2017-06-18 DIAGNOSIS — Z95828 Presence of other vascular implants and grafts: Secondary | ICD-10-CM

## 2017-06-18 LAB — CBC WITH DIFFERENTIAL/PLATELET
BASO%: 0.3 % (ref 0.0–2.0)
BASOS ABS: 0 10*3/uL (ref 0.0–0.1)
EOS ABS: 0.1 10*3/uL (ref 0.0–0.5)
EOS%: 1 % (ref 0.0–7.0)
HCT: 38.2 % (ref 34.8–46.6)
HEMOGLOBIN: 12.6 g/dL (ref 11.6–15.9)
LYMPH%: 29.5 % (ref 14.0–49.7)
MCH: 31.1 pg (ref 25.1–34.0)
MCHC: 33 g/dL (ref 31.5–36.0)
MCV: 94.3 fL (ref 79.5–101.0)
MONO#: 0.4 10*3/uL (ref 0.1–0.9)
MONO%: 5.9 % (ref 0.0–14.0)
NEUT#: 4.5 10*3/uL (ref 1.5–6.5)
NEUT%: 63.3 % (ref 38.4–76.8)
Platelets: 247 10*3/uL (ref 145–400)
RBC: 4.05 10*6/uL (ref 3.70–5.45)
RDW: 15.5 % — AB (ref 11.2–14.5)
WBC: 7.1 10*3/uL (ref 3.9–10.3)
lymph#: 2.1 10*3/uL (ref 0.9–3.3)

## 2017-06-18 LAB — COMPREHENSIVE METABOLIC PANEL
ALBUMIN: 3 g/dL — AB (ref 3.5–5.0)
ALK PHOS: 45 U/L (ref 40–150)
ALT: 21 U/L (ref 0–55)
AST: 27 U/L (ref 5–34)
Anion Gap: 8 mEq/L (ref 3–11)
BUN: 7.9 mg/dL (ref 7.0–26.0)
CO2: 26 mEq/L (ref 22–29)
Calcium: 9.6 mg/dL (ref 8.4–10.4)
Chloride: 105 mEq/L (ref 98–109)
Creatinine: 0.8 mg/dL (ref 0.6–1.1)
GLUCOSE: 105 mg/dL (ref 70–140)
POTASSIUM: 3.6 meq/L (ref 3.5–5.1)
SODIUM: 139 meq/L (ref 136–145)
Total Bilirubin: 0.45 mg/dL (ref 0.20–1.20)
Total Protein: 7.3 g/dL (ref 6.4–8.3)

## 2017-06-18 MED ORDER — SODIUM CHLORIDE 0.9% FLUSH
10.0000 mL | INTRAVENOUS | Status: DC | PRN
  Administered 2017-06-18: 10 mL via INTRAVENOUS
  Filled 2017-06-18: qty 10

## 2017-06-18 MED ORDER — PALONOSETRON HCL INJECTION 0.25 MG/5ML
0.2500 mg | Freq: Once | INTRAVENOUS | Status: AC
  Administered 2017-06-18: 0.25 mg via INTRAVENOUS

## 2017-06-18 MED ORDER — SODIUM CHLORIDE 0.9 % IV SOLN
Freq: Once | INTRAVENOUS | Status: AC
  Administered 2017-06-18: 14:00:00 via INTRAVENOUS
  Filled 2017-06-18: qty 5

## 2017-06-18 MED ORDER — PROCHLORPERAZINE MALEATE 10 MG PO TABS
ORAL_TABLET | ORAL | Status: AC
  Filled 2017-06-18: qty 1

## 2017-06-18 MED ORDER — PALONOSETRON HCL INJECTION 0.25 MG/5ML
INTRAVENOUS | Status: AC
  Filled 2017-06-18: qty 5

## 2017-06-18 MED ORDER — IRINOTECAN HCL CHEMO INJECTION 100 MG/5ML
140.0000 mg/m2 | Freq: Once | INTRAVENOUS | Status: AC
  Administered 2017-06-18: 340 mg via INTRAVENOUS
  Filled 2017-06-18: qty 15

## 2017-06-18 MED ORDER — HEPARIN SOD (PORK) LOCK FLUSH 100 UNIT/ML IV SOLN
500.0000 [IU] | Freq: Once | INTRAVENOUS | Status: AC | PRN
  Administered 2017-06-18: 500 [IU]
  Filled 2017-06-18: qty 5

## 2017-06-18 MED ORDER — SODIUM CHLORIDE 0.9% FLUSH
10.0000 mL | INTRAVENOUS | Status: DC | PRN
  Administered 2017-06-18: 10 mL
  Filled 2017-06-18: qty 10

## 2017-06-18 MED ORDER — PROCHLORPERAZINE MALEATE 10 MG PO TABS
10.0000 mg | ORAL_TABLET | Freq: Once | ORAL | Status: AC | PRN
  Administered 2017-06-18: 10 mg via ORAL

## 2017-06-18 MED ORDER — ALTEPLASE 2 MG IJ SOLR
2.0000 mg | Freq: Once | INTRAMUSCULAR | Status: DC | PRN
  Filled 2017-06-18: qty 2

## 2017-06-18 MED ORDER — ATROPINE SULFATE 1 MG/ML IJ SOLN
0.5000 mg | Freq: Once | INTRAMUSCULAR | Status: AC | PRN
  Administered 2017-06-18: 0.5 mg via INTRAVENOUS

## 2017-06-18 MED ORDER — ACETAMINOPHEN-CODEINE #4 300-60 MG PO TABS
1.0000 | ORAL_TABLET | Freq: Three times a day (TID) | ORAL | 0 refills | Status: DC | PRN
Start: 2017-06-18 — End: 2017-07-02

## 2017-06-18 MED ORDER — ATROPINE SULFATE 1 MG/ML IJ SOLN
INTRAMUSCULAR | Status: AC
  Filled 2017-06-18: qty 1

## 2017-06-18 MED ORDER — SODIUM CHLORIDE 0.9 % IV SOLN
Freq: Once | INTRAVENOUS | Status: AC
  Administered 2017-06-18: 13:00:00 via INTRAVENOUS

## 2017-06-18 MED ORDER — SODIUM CHLORIDE 0.9 % IV SOLN
600.0000 mg | Freq: Once | INTRAVENOUS | Status: AC
  Administered 2017-06-18: 600 mg via INTRAVENOUS
  Filled 2017-06-18: qty 16

## 2017-06-18 NOTE — Progress Notes (Signed)
[place]  [phone] Fax:([phone]) [phone]  Clinic Follow up Note   Patient Care Team: [name], MD as PCP - General (Internal Medicine) Leighton Ruff, MD as Consulting Physician (General Surgery) Carol Ada, MD as Consulting Physician (Gastroenterology) Concha Norway, MD (Inactive) as Consulting Physician (Internal Medicine) Emily Filbert, MD as Consulting Physician (Obstetrics and Gynecology) Tania Ade, RN as Registered Nurse (Medical Oncology) Stark Klein, MD as Consulting Physician (General Surgery) [name], MD as Consulting Physician (Hematology) 06/18/2017  CHIEF COMPLAINT:  Follow-up colon cancer  PROBLEM LIST 1. Left breast DCIS diagnosed in 2006 and 2010, status post lumpectomy. 2. pT2N0M0 stage I colon cancer, s/p partial colectomy on 03/09/2014  3. PE in 12/2013 when she was diagnosed with colon cancer. 4. Right breast stage I breast cancer diagnosed in 01/2015  SUMMARY OF ONCOLOGIC HISTORY: Oncology History   Breast cancer of lower-outer quadrant of right female breast [facility])   Staging form: Breast, AJCC 7th Edition     Clinical stage from 02/01/2015: Stage IA (T1a, N0, M0) - Signed by [name], MD on 06/04/2015     Pathologic stage from 11/10/2015: Stage IA (T1b, N0, M0) - Signed by [name], MD on 12/08/2015 Cancer of sigmoid colon [facility])   Staging form: Colon and Rectum, AJCC 7th Edition     Clinical: Stage I (T2, N0, M0) - Unsigned     Breast cancer of lower-outer quadrant of right female breast ([place])   2006 Cancer Diagnosis    Left breast DCIS diagnosed in 2006 and 2010, status post lumpectomy.      01/07/2014 Initial Diagnosis    Breast cancer      01/18/2015 Mammogram    57m mass in lower outer quadrant of right breast       02/01/2015 Initial Biopsy    (R) breast mass biopsy showed invasive ductal carcinoma & DCIS, grade 1-2.       02/01/2015 Receptors her2    ER 90%+, PR 10%+, Ki67 10%, HER2 negative  (ratio 1.09)       11/07/2015 Surgery    (R) breast lumpectomy and sentinel lymph node biopsy ([name]      11/07/2015 Pathology Results    (R) breast lumpectomy showed invasive ductal carcinoma, grade 2, 1 cm, low-grade DCIS, negative margins, 3 sentinel lymph nodes were negative. LVI (-). HER2 repeated and remains negative (ratio 1.29).        11/07/2015 Pathologic Stage    pT1b,pN0: Stage IA       11/07/2015 Oncotype testing    RS 22, which predicts a year risk of distant recurrence with tamoxifen alone 14% (intermediate-risk). Adjuvant chemo was not offered      02/21/2016 - 04/10/2016 Radiation Therapy    Adjuvant breast radiation. ([name]). Right breast: 50.4 Gy in 28 fractions.  Right breast boost: 12 Gy in 6 fractions.      05/2016 -  Anti-estrogen oral therapy    Femara (Letrozole) 2.5 mg daily.       10/17/2016 Mammogram    MM DIAG BREAST TOMO BILATERAL 10/17/2016 FINDINGS: There are bilateral lumpectomy changes in the upper central left breast and upper central right breast. No mass, nonsurgical distortion, or suspicious microcalcification is identified in either breast to suggest malignancy. Mammographic images were processed with CAD. IMPRESSION: No evidence of malignancy in either breast. Lumpectomy changes bilaterally. RECOMMENDATION: Diagnostic mammogram is suggested in 1 year. (Code:DM-B-01Y)       Cancer of sigmoid colon ([place]  03/04/2014 Initial Diagnosis    Cancer of sigmoid colon      03/09/2014 Pathology Results    G1 invasive adenocarcinoma, no lymph-Vascular invasion or Peri-neural invasion: Absent. MMR normal, MSI stable.       03/09/2014 Surgery    partial colectomy, negative margins.       11/10/2014 Relapse/Recurrence    biopsy confirmed oligo liver recurrence       11/10/2014 Imaging    PET showed two small ares of hypermetabolism in the right lobe of the liver, no other distant metastasis.       11/30/2014 Imaging    abdomen MRI showed a new  enhancing lesion which corresponds to an area of abnormal hyper metabolism on recent PET-CT. Stable enlarged portal caval lymph node.       01/02/2015 Pathology Results    Liver biopsy showed metastatic adenocarcinoma, IHC (+) for CK20, CDX-2, (-) for TTF-1 and ER      01/02/2015 Miscellaneous    liver biopsy KRAS mutation (+)       02/14/2015 Imaging    CT showed:  the small metastatic right hepatic lobe liver lesion is not identified for certain on this examination. No new lesions. Stable small cysts in segment 4 a.       03/05/2015 - 04/16/2015 Chemotherapy    CAPEOX (capecitabine 2500 mg twice daily Day 1-14, oxaliplatin 1 30 mg/m on day 1, every 21 days, S/P 3 cycles       08/24/2015 Procedure    CT-guided oligo liver metastasis microwave ablation by Dr. [name]      06/17/2016 Imaging    CT of the chest/abd/pelvis with contrast 1. Several new small pulmonary nodules worrisome for metastatic disease. 2. New large area of probable infiltrating recurrent tumor in the right hepatic lobe surrounding the prior ablation site. 3. Small supraclavicular lymph nodes.  Attention on future studies. 4. Stable surgical changes involving both breasts. No breast masses are identified. 5. Stable bilateral adrenal gland nodules. 6. Stable upper abdominal and right-sided retroperitoneal lymph nodes.      07/08/2016 PET scan    1. Large mass in the right lobe of the liver demonstrates diffuse hypermetabolism, compatible with recurrent metastatic disease in the liver 2. Multiple small pulmonary nodules are very similar to the recent Chest CT 06/17/16.  3. No other findings of metastatic disease elsewhere in the neck, chest, abdomen, or pelvis. 4. There is some low-level hypermetabolic activity adjacent to the surgical clips in the lateral aspect of the right breast at the site of prior lumpectomy.       07/29/2016 Pathology Results    Liver biopsy confirmed metastatic colon cancer        07/31/2016 -  Chemotherapy    FOLFIRI every 2 weeks, Avastin added from cycle 2       10/23/2016 Imaging    CT ABDOMEN PELVIS W CONTRAST 10/24/16 IMPRESSION: 1. The numerous tiny bilateral pulmonary nodules seen on the previous imaging exams are no longer evident, suggesting response of therapy. 2. Large ill-defined irregular right hepatic lesion measures smaller on today's study. 3. Slight increase in size of hepatoduodenal ligament lymphadenopathy. 4. Abdominal Aortic Atherosclerois (ICD10-170.0)      01/05/2017 Imaging    CT CAP IMPRESSION: 1. Left lower lobe pulmonary nodule and hepatic metastatic disease are stable. 2. Hepatoduodenal ligament lymph node is stable. 3. Aortic atherosclerosis (ICD10-170.0). Coronary artery calcification. 4. Enlarged pulmonary arteries, indicative of pulmonary arterial hypertension. 5. Small bilateral adrenal nodules are unchanged.  05/09/2017 Imaging    CT CAP IMPRESSION: 1. Stable appearance left lower lobe pulmonary nodule with an apparent new tiny posterior right upper lobe pulmonary nodule. 2. Dominant ill-defined right hepatic mass measures smaller on today's study. 3. Slight decrease in the hepatoduodenal ligament lymph node. 4. Aortic Atherosclerois (ICD10-170.0)       INTERVAL HISTORY: Veronica Reyes 61 y.o. female with a history of recurrent left breast cancer, colon cancer s/p laparoscopic-assisted partial colectomy (03/09/2014), PE (01/06/2014), and right breast cancer (01/2015),  presents to clinic for follow up as scheduled.   Veronica Reyes received her last cycle of FOLFIRI and Avastin from 06/04/2017 to 06/06/2017. She experienced moderate fatigue for 4 days after chemotherapy that is now improved, she denies decreased appetite or weight loss. She has occasional mild hot flashes at night, she sleeps with a fan, denies fever or chills. She has intermittent left shoulder and right abdominal aches, controlled with PRN Tylenol No.  4. Hydrocodone does not work well for her, she did not fill previous Norco prescription written by Dr. Burr Medico at last visit She has loose stools for 3 days after chemotherapy that is now resolved. She required no oral antibiotics after her last chemotherapy, as nausea was well managed with the addition of IV and Emend with her last cycle.  REVIEW OF SYSTEMS:   Constitutional: Denies fevers, chills or abnormal weight loss (+) fatigue, improved (+) intermittent hot flashes, worse at night  Eyes: Denies blurriness of vision Ears, nose, mouth, throat, and face: Denies mucositis or sore throat Respiratory: Denies cough, dyspnea or wheezes Cardiovascular: Denies palpitation, chest discomfort or upper or  lower extremity swelling Gastrointestinal:  Denies nausea,vomiting, constipation, heartburn or change in bowel habits. Denies blood in stool (+) loose stools 3 days after chemotherapy, resolved Skin: Denies abnormal skin rashes Lymphatics: Denies new lymphadenopathy or easy bruising Neurological:Denies numbness, tingling or new weaknesses Behavioral/Psych: Mood is stable, no new changes  All other systems were reviewed with the patient and are negative.  MEDICAL HISTORY:  Past Medical History:  Diagnosis Date  . Anxiety   . Breast CA ([place])    radiation and surgery-lt.  . Cancer ([place])    left breast cancer x2  . Hypertension   . Liver lesion   . Pulmonary embolism ([place])    "blood clot in lungs" -dx. 4'15 with CT Chest. On Xarelto  . Radiation 01/22/10-03/12/10   left whole breast 4500 cGy, upper aspect boosted to 6120 cGy  . Radiation 02/21/16 - 04/10/16   right breast 50.4 Gy, right breast boost 12 Gy  . Shortness of breath    sob with exertion. dx. with Pulmonary emboli 4'15 ,tx. Xarelto,Lovenox used for [employer].    SURGICAL HISTORY: Past Surgical History:  Procedure Laterality Date  . BREAST LUMPECTOMY WITH RADIOACTIVE SEED AND SENTINEL LYMPH NODE BIOPSY Right 11/07/2015   Procedure:  BREAST LUMPECTOMY WITH RADIOACTIVE SEED AND SENTINEL LYMPH NODE BIOPSY;  Surgeon: Stark Klein, MD;  Location: [place];  Service: General;  Laterality: Right;  . BREAST SURGERY     '11- Left breast lumpectomy  . CESAREAN SECTION    . CHOLECYSTECTOMY     Laparoscopic 20 yrs ago  . COLON SURGERY  03-09-14   partial colectomy for cancer  . IR GENERIC HISTORICAL  07/29/2016   IR FLUORO GUIDE PORT INSERTION RIGHT 07/29/2016 Sandi Mariscal, MD [facility]  . IR GENERIC HISTORICAL  07/29/2016   IR US GUIDE VASC ACCESS RIGHT 07/29/2016 Sandi Mariscal, MD  [facility]  . IR GENERIC HISTORICAL  07/29/2016   IR US GUIDE BX ASP/DRAIN 07/29/2016 [facility]  . PARTIAL COLECTOMY N/A 03/09/2014   Procedure: LAPAROSCOPIC ASSISTED PARTIAL COLECTOMY, splenic flexure mobilization;  Surgeon: Leighton Ruff, MD;  Location: [facility];  Service: General;  Laterality: N/A;  . RADIOFREQUENCY ABLATION Right 08/24/2015   Procedure: MICROWAVE ABLATION RIGHT LIVER LOBE ;  Surgeon: Corrie Mckusick, DO;  Location: [facility];  Service: Anesthesiology;  Laterality: Right;    I have reviewed the social history and family history with the patient and they are unchanged from previous note.  ALLERGIES:  is allergic to tramadol.  MEDICATIONS:  Current Outpatient Prescriptions  Medication Sig Dispense Refill  . acetaminophen-codeine (TYLENOL #4) 300-60 MG tablet Take 1 tablet by mouth every 8 (eight) hours as needed. for pain 30 tablet 0  . amLODipine (NORVASC) 5 MG tablet Take 1 tablet (5 mg total) by mouth daily. 30 tablet 0  . ferrous sulfate 325 (65 FE) MG tablet Take 325 mg by mouth daily with breakfast. Pt takes  4 times per week.    . hydrochlorothiazide (HYDRODIURIL) 25 MG tablet Take 1 tablet (25 mg total) by mouth every morning. 30 tablet 0  . letrozole (FEMARA) 2.5 MG tablet TAKE 1 TABLET BY MOUTH EVERY DAY 30 tablet 3  . lidocaine-prilocaine (EMLA) cream Apply 1 application topically as needed. 30 g 2  .  magic mouthwash w/lidocaine SOLN Take 5 mLs by mouth 4 (four) times daily as needed for mouth pain. 240 mL 1  . metoprolol tartrate (LOPRESSOR) 50 MG tablet Take 1 tablet (50 mg total) by mouth 2 (two) times daily. 60 tablet 0  . potassium chloride SA (K-DUR,KLOR-CON) 20 MEQ tablet TAKE 1 TABLET BY MOUTH 2 TIMES DAILY 60 tablet 2  . prochlorperazine (COMPAZINE) 10 MG tablet Take 1 tablet (10 mg total) by mouth every 6 (six) hours as needed (NAUSEA). 30 tablet 2  . promethazine (PHENERGAN) 25 MG tablet Take 1 tablet (25 mg total) by mouth every 6 (six) hours as needed for nausea or vomiting. 30 tablet 2  . rivaroxaban (XARELTO) 20 MG TABS tablet Take 1 tablet (20 mg total) by mouth daily. 30 tablet 3  . dexamethasone (DECADRON) 4 MG tablet Take 1 tablet (4 mg total) by mouth daily. 20 tablet 1  . HYDROcodone-acetaminophen (NORCO) 5-325 MG tablet Take 1 tablet by mouth every 8 (eight) hours as needed for moderate pain. 60 tablet 0  . LORazepam (ATIVAN) 0.5 MG tablet Take 1 tablet (0.5 mg total) by mouth every 8 (eight) hours. 30 tablet 0  . ondansetron (ZOFRAN) 8 MG tablet Take 1 tablet (8 mg total) by mouth 2 (two) times daily as needed for refractory nausea / vomiting. Start on day 3 after chemotherapy. 30 tablet 1  . zolpidem (AMBIEN) 5 MG tablet Take 1 tablet (5 mg total) by mouth at bedtime as needed for sleep. 20 tablet 0   No current facility-administered medications for this visit.    Facility-Administered Medications Ordered in Other Visits  Medication Dose Route Frequency Provider Last Rate Last Dose  . 0.9 %  sodium chloride infusion   Intravenous Once [name], MD      . atropine injection 0.5 mg  0.5 mg Intravenous Once PRN [name], MD      . bevacizumab (AVASTIN) 600 mg in sodium chloride 0.9 % 100 mL chemo infusion  600 mg Intravenous Once [name], MD      . fosaprepitant (  EMEND) 150 mg, dexamethasone (DECADRON) 10 mg in sodium chloride 0.9 % 145 mL IVPB   Intravenous Once [name], MD      . heparin lock flush 100 unit/mL  500 Units Intracatheter Once PRN [name], MD      . irinotecan (CAMPTOSAR) 340 mg in dextrose 5 % 500 mL chemo infusion  140 mg/m2 (Treatment Plan Recorded) Intravenous Once [name], MD      . palonosetron Leona Carry) injection 0.25 mg  0.25 mg Intravenous Once [name], MD      . prochlorperazine (COMPAZINE) tablet 10 mg  10 mg Oral Once PRN [name], MD      . sodium chloride flush (NS) 0.9 % injection 10 mL  10 mL Intracatheter PRN [name], MD        PHYSICAL EXAMINATION: ECOG PERFORMANCE STATUS: 2 - Symptomatic, <50% confined to bed  Vitals:   06/18/17 1138  BP: (!) 131/96  Pulse: 66  Resp: 17  Temp: 98.9 F (37.2 C)  SpO2: 99%   Filed Weights   06/18/17 1138  Weight: 274 lb 1.6 oz (124.3 kg)    GENERAL:alert, no distress and comfortable SKIN: skin color, texture, turgor are normal, no rashes or significant lesions EYES: normal, Conjunctiva are pink and non-injected, sclera clear OROPHARYNX:no exudate, no erythema and lips, buccal mucosa, and tongue normal  NECK: supple, thyroid normal size, non-tender, without nodularity LYMPH:  no palpable Cervical or supraclavicular lymphadenopathy  LUNGS: clear to auscultation  bilaterally with normal breathing effort HEART: regular rate & rhythm, S1 and S2 present, no murmurs and no lower extremity edema ABDOMEN:abdomen soft, round, non-tender and normal bowel sounds. No palpable hepatomegaly or masses Musculoskeletal:no cyanosis of digits and no clubbing  NEURO: alert & oriented x 3 with fluent speech, no focal motor/sensory deficits  LABORATORY DATA:  I have reviewed the data as listed CBC Latest Ref Rng & Units 06/18/2017 06/04/2017 05/28/2017  WBC 3.9 - 10.3 10e3/uL 7.1 8.9 8.9  Hemoglobin 11.6 - 15.9 g/dL 12.6 13.1 13.1  Hematocrit 34.8 - 46.6 % 38.2 40.0 39.0  Platelets 145 - 400 10e3/uL 247 229 230    CMP Latest Ref Rng & Units 06/18/2017 06/04/2017 05/28/2017  Glucose 70 -  140 mg/dl 105 108 93  BUN 7.0 - 26.0 mg/dL 7.9 11.5 10.7  Creatinine 0.6 - 1.1 mg/dL 0.8 0.8 0.8  Sodium 136 - 145 mEq/L 139 142 141  Potassium 3.5 - 5.1 mEq/L 3.6 3.8 3.7  Chloride 101 - 111 mmol/L - - -  CO2 22 - 29 mEq/L '26 25 25  ' Calcium 8.4 - 10.4 mg/dL 9.6 10.0 9.8  Total Protein 6.4 - 8.3 g/dL 7.3 8.0 7.4  Total Bilirubin 0.20 - 1.20 mg/dL 0.45 0.63 0.59  Alkaline Phos 40 - 150 U/L 45 46 43  AST 5 - 34 U/L 27 44(H) 29  ALT 0 - 55 U/L '21 23 19   ' CEA  01/18/2014: 0.8 03/01/2015: 1.3 01/31/2016: 4.8 05/28/2016: 48 07/31/2016: 173.7 08/28/16: 139 09/25/2016: 43 11/04/16: 9.94 12/18/2016: 6.99 01/29/17: 4.58 03/04/17: 6.09 04/09/17: 4.22 05/07/17: 5.29 06/04/17: 4.12  PATHOLOGY RESULT Diagnosis 01/02/2015 Liver, needle/core biopsy, right - POSITIVE FOR METASTATIC ADENOCARCINOMA. - SEE COMMENT.   Diagnosis 11/07/2015 1. Breast, lumpectomy, Right - INVASIVE DUCTAL CARCINOMA, GRADE 2, SPANNING 1 CM. - DUCTAL CARCINOMA IN SITU, LOW GRADE. - RESECTION MARGINS ARE NEGATIVE FOR INVASIVE CARCINOMA. - DUCTAL CARCINOMA IN SITU COMES TO WITHIN 0.3 CM OF THE POSTERIOR MARGIN. - BIOPSY SITE. -  SEE ONCOLOGY TABLE. 2. Lymph node, sentinel, biopsy, right axillary #1 - ONE OF ONE LYMPH NODES NEGATIVE FOR CARCINOMA (0/1). 3. Lymph node, sentinel, biopsy, right axillary #2 - ONE OF ONE LYMPH NODES NEGATIVE FOR CARCINOMA (0/1). 4. Lymph node, sentinel, biopsy, right axillary #3 - ONE OF ONE LYMPH NODES NEGATIVE FOR CARCINOMA (0/1).  ONCOTYPE DX: RS 33, which predicts a year risk of distant recurrence with tamoxifen alone 14%   Diagnosis 07/29/2016 Liver, needle/core biopsy, posterior of right lobe METASTATIC ADENOCARCINOMA, CONSISTENT WITH COLONIC PRIMARY.  RADIOLOGY STUDIES   PET 07/08/2016 IMPRESSION: 1. Large mass in the right lobe of the liver demonstrates diffuse hypermetabolism, compatible with recurrent metastatic disease in the liver. 2. Multiple small pulmonary nodules  are very similar to the recent chest CT 06/17/2016. These do not demonstrate hypermetabolism on the PET images, however, these lesions are well below the level of PET imaging. Given that these nodules are new compared to prior chest CT 12/18/2015, they are concerning for potential metastatic disease to the lungs, and continued attention on followup studies is recommended. 3. No other findings of metastatic disease elsewhere in the neck, chest, abdomen or pelvis. 4. There is some low-level hypermetabolic activity adjacent to the surgical clips in the lateral aspect of the right breast at the site of prior lumpectomy. This area has undergone interval involution over the several prior examinations, and this low-level activity is favored to be related to normal healing. Attention on follow-up imaging is recommended to ensure the stability or resolution of this finding. 5. Aortic atherosclerosis, in addition to 2 vessel coronary artery disease. Please note that although the presence of coronary artery calcium documents the presence of coronary artery disease, the severity of this disease and any potential stenosis cannot be assessed on this non-gated CT examination. Assessment for potential risk factor modification, dietary therapy or pharmacologic therapy may be warranted, if clinically indicated. 6. Additional incidental findings, as above.  MM DIAG BREAST TOMO BILATERAL 10/17/2016 FINDINGS: There are bilateral lumpectomy changes in the upper central left breast and upper central right breast. No mass, nonsurgical distortion, or suspicious microcalcification is identified in either breast to suggest malignancy. Mammographic images were processed with CAD. IMPRESSION: No evidence of malignancy in either breast. Lumpectomy changes bilaterally. RECOMMENDATION: Diagnostic mammogram is suggested in 1 year. (Code:DM-B-01Y)  CT CHEST, ABDOMEN PELVIS W CONTRAST 10/24/16 IMPRESSION: 1. The numerous  tiny bilateral pulmonary nodules seen on the previous imaging exams are no longer evident, suggesting response of therapy. 2. Large ill-defined irregular right hepatic lesion measures smaller on today's study. 3. Slight increase in size of hepatoduodenal ligament lymphadenopathy. 4. Abdominal Aortic Atherosclerois (ICD10-170.0)  CT CAP 01/05/17 IMPRESSION: 1. Left lower lobe pulmonary nodule and hepatic metastatic disease are stable. 2. Hepatoduodenal ligament lymph node is stable. 3. Aortic atherosclerosis (ICD10-170.0). Coronary artery calcification. 4. Enlarged pulmonary arteries, indicative of pulmonary arterial hypertension. 5. Small bilateral adrenal nodules are unchanged.  CT CAP 05/09/17 IMPRESSION: 1. Stable appearance left lower lobe pulmonary nodule with an apparent new tiny posterior right upper lobe pulmonary nodule. Continued attention on follow-up recommended. 2. Dominant ill-defined right hepatic mass measures smaller on today's study. 3. Slight decrease in the hepatoduodenal ligament lymph node. 4. Aortic Atherosclerois (ICD10-170.0)  RADIOGRAPHIC STUDIES: I have personally reviewed the radiological images as listed and agreed with the findings in the report. No results found.   ASSESSMENT & PLAN: Veronica Reyes is a 62 year old female with recurrent breast cancer and metastatic colon cancer.   1. Colon  Cancer, Stage I (pT2N0), MMR normal, oligo recurrent disease in liver in 12/2014, KRAS mutation (+), liver and lung recurrence in 06/2016 -She initially received 3 cycles of [place], does not want to proceed with fourth cycle due to the moderate side effects from previous chemotherapy treatment. She declined liver mass resection, and underwent liver ablation by interventional radiologist Dr. Earleen Newport in 08/2015. -Restaging CT on 06/17/2016 showed new area of infiltrative disease at the previous liver ablation site, likely recurrence, as well as several new pulmonary  nodules, small, concerning for metastatic disease  -Liver biopsy on 07/29/2016 confirmed metastatic adenocarcinoma of the colon  -She began FOLFIRI chemotherapy on 07/31/2016, Avastin was added with cycle 2 -Her CEA has significantly decreased since starting chemotherapy, likely indicative of a good response to chemotherapy -Repeat CT scan on 10/23/16 showed resolution of numerous bilateral pulmonary nodules, the right hepatic lesion is smaller, and slight decrease in size of hepatoduodenal ligament lymphadenopathy. No other new lesions  -Restaging CT scan from 01/05/2017 showed stable pulmonary and liver metastasis, no other new lesions. -restaging CT scan from 05/09/2017, which showed continuous response to treatment, her liver metastasis has slightly decreased in size, no other new liver lesions. She still has a few small pulmonary nodules, we'll continue monitoring. -Nausea has been much better controlled with addition of IV Emend with cycle 14, she continues to use Compazine and Phenergan when necessary -The patient previously agreed to adhere to the every 2 week chemotherapy regimen, however she has to go to a funeral in [place] on Saturday morning. She is asking to defer 5-FU pump with this cycle. I discussed we could reprogrammed the pump to have it finished at 8 AM, but she is not in control of when she leaves because her sister will drive her. We will hold 5-FU today, she will receive irinotecan and Avastin only today -Labs reviewed, CBC and CMP are unremarkable and adequate for treatment, CA 19-9 not obtained today. physical exam is unremarkable. She'll proceed with next cycle of chemotherapy today.  -We discussed that her disease still appears to be sensitive to chemotherapy, I plan to scan again at the end of November 2018, at that time we can consider maintenance therapy with Xeloda and Avastin -She will return for lab, flush, chemotherapy FOLFIRI/Avastin in 2 and 4 weeks -Follow-up in 4  weeks  2. Right breast cancer, pT1bN0M0, stage Ia, ER+/PR+/HER2-, History of left Breast Cancer, diagnosed on 02/01/2015 -Left CIS in situ, s/p lumpectomy in 2006 and 2011 by Dr. Margot Chimes, and radiation in 2011 by Dr. Sondra Come.  -she is aware of surgical pathology results from 11/10/2015, which showed a 1 cm grade 2 invasive ductal carcinoma and DCIS. Surgical margins were negative. -Oncotype DX result. Her recurrence score is 22, which predicts 14% 10 year risk of distant recurrence with tamoxifen alone. This is intermediate risk, based on her relatively low number, adjuvant chemotherapy was not recommended  -She has completed adjuvant breast radiation -her [place] level supports she is postmenopausal -She has started adjuvant letrozole, tolerating well, we'll continue. -We previously discussed breast cancer surveillance, I strongly encouraged her to continue annual screening mammogram, self exam, and follow-up with [place] routinely. -her mammogram was normal 10/2016, repeat in 10/2017  3. History of PE (01/06/2014) -She has a diagnosis of pulmonary embolism, likely secondary to malignancy in the setting of family history for stroke (and possible stroke history in patient as well). Lower extremity dopplers negative.  -continue Xarelto. Due to her metastatic colon cancer, Dr. Burr Medico  previously recommended her to continue indefinitely. -She is going on a 3 hour car trip this weekend, I encouraged her to get up and move every hour, she will wear her compression stockings  4.  Uterine Fibroids  --Negative endometrial biopsy. Following with Gynecology.  5. Kidney lesion (Right). -No hypermetabolic right kidney lesion on the pet scan -Repeat abdominal MRI 05/29/2015 showed unchanged right renal lesion, favored to represent a complex/septated cyst.  6. Right flank pain -Possibly related to her liver metastasis -improved with chemo, well controlled at this time -She prefers Tylenol No. 4 to hydrocodone, she uses  this once a day or less PRN, I refilled her prescription today -Continue Tylenol No. 4 as needed for pain. Her abdominal pain overall has improved lately.  7.  Morbid obesity - I have previously encouraged her to eat healthy and exercise more. -I encouraged her to advance her activity as tolerated -She wants to begin taking a daily multivitamin, she can start this at any time  8. HTN  -She will continue medication and following up with her PCP -We discussed that Avastin can increase her blood pressure, I recommend her to continue monitoring at home -BP within normal range on recheck in infusion room today, 119/77  9. Nose bleeds - I have previously  discussed the patient her nosebleeds could be related to Avastin  -she knows to avoid blowing nose -She denies any bleeding at this time  Plan -Labs reviewed, adequate for treatment, she will proceed with next cycle today, Irinotecan and Avastin only -Lab, flush, chemotherapy FOLFIRI/Avastin in 2 and 4 weeks -Follow-up in 4 weeks -Okay to begin daily multivitamin -Continue Tylenol No. 4 when necessary for pain,  -Continue letrozole daily -Advance exercise as tolerated -Up to move every hour during prolonged car travel   All questions were answered. The patient knows to call the clinic with any problems, questions or concerns. No barriers to learning was detected. I spent 20 counseling the patient face to face. The total time spent in the appointment was 25 and more than 50% was on counseling and review of test results   Alla Feeling, NP 06/18/17

## 2017-06-18 NOTE — Telephone Encounter (Signed)
Scheduled appt per 10/4 - unable to scheduled 2 week appt due to availability - message sent to [facility]. For help . Patient is aware - all other appts already scheduled

## 2017-06-18 NOTE — Patient Instructions (Signed)
[facility] Discharge Instructions for Patients Receiving Chemotherapy  Today you received the following chemotherapy agents: Avastin & Irinotecan  To help prevent nausea and vomiting after your treatment, we encourage you to take your nausea medication as directed   If you develop nausea and vomiting that is not controlled by your nausea medication, call the clinic.   BELOW ARE SYMPTOMS THAT SHOULD BE REPORTED IMMEDIATELY:  *FEVER GREATER THAN 100.5 F  *CHILLS WITH OR WITHOUT FEVER  NAUSEA AND VOMITING THAT IS NOT CONTROLLED WITH YOUR NAUSEA MEDICATION  *UNUSUAL SHORTNESS OF BREATH  *UNUSUAL BRUISING OR BLEEDING  TENDERNESS IN MOUTH AND THROAT WITH OR WITHOUT PRESENCE OF ULCERS  *URINARY PROBLEMS  *BOWEL PROBLEMS  UNUSUAL RASH Items with * indicate a potential emergency and should be followed up as soon as possible.  Feel free to call the clinic should you have any questions or concerns. The clinic phone number is [phone].  Please show the CHEMO ALERT CARD at check-in to the Emergency Department and triage nurse.   

## 2017-06-22 ENCOUNTER — Telehealth: Payer: Self-pay | Admitting: Hematology

## 2017-06-22 NOTE — Telephone Encounter (Signed)
Scheduled appt per 10/4 for 10/18 - patient is aware of apt time and date.

## 2017-06-30 ENCOUNTER — Telehealth: Payer: Self-pay | Admitting: Hematology

## 2017-06-30 NOTE — Telephone Encounter (Signed)
Spoke with husband regarding appts scheduled per 10/16 sch msg.

## 2017-07-01 ENCOUNTER — Other Ambulatory Visit: Payer: Self-pay | Admitting: Hematology

## 2017-07-01 DIAGNOSIS — C50511 Malignant neoplasm of lower-outer quadrant of right female breast: Secondary | ICD-10-CM

## 2017-07-01 DIAGNOSIS — Z170 Estrogen receptor positive status [ER+]: Principal | ICD-10-CM

## 2017-07-02 ENCOUNTER — Other Ambulatory Visit: Payer: Self-pay | Admitting: Emergency Medicine

## 2017-07-02 ENCOUNTER — Other Ambulatory Visit (HOSPITAL_BASED_OUTPATIENT_CLINIC_OR_DEPARTMENT_OTHER): Payer: BLUE CROSS/BLUE SHIELD

## 2017-07-02 ENCOUNTER — Ambulatory Visit (HOSPITAL_BASED_OUTPATIENT_CLINIC_OR_DEPARTMENT_OTHER): Payer: BLUE CROSS/BLUE SHIELD

## 2017-07-02 VITALS — BP 111/83 | HR 60 | Temp 98.6°F | Resp 18

## 2017-07-02 DIAGNOSIS — C187 Malignant neoplasm of sigmoid colon: Secondary | ICD-10-CM

## 2017-07-02 DIAGNOSIS — C50511 Malignant neoplasm of lower-outer quadrant of right female breast: Secondary | ICD-10-CM

## 2017-07-02 DIAGNOSIS — C787 Secondary malignant neoplasm of liver and intrahepatic bile duct: Secondary | ICD-10-CM | POA: Diagnosis not present

## 2017-07-02 DIAGNOSIS — Z5111 Encounter for antineoplastic chemotherapy: Secondary | ICD-10-CM

## 2017-07-02 DIAGNOSIS — Z5112 Encounter for antineoplastic immunotherapy: Secondary | ICD-10-CM | POA: Diagnosis not present

## 2017-07-02 DIAGNOSIS — Z170 Estrogen receptor positive status [ER+]: Secondary | ICD-10-CM

## 2017-07-02 LAB — COMPREHENSIVE METABOLIC PANEL
ALT: 20 U/L (ref 0–55)
AST: 26 U/L (ref 5–34)
Albumin: 3 g/dL — ABNORMAL LOW (ref 3.5–5.0)
Alkaline Phosphatase: 43 U/L (ref 40–150)
Anion Gap: 8 mEq/L (ref 3–11)
BUN: 10 mg/dL (ref 7.0–26.0)
CHLORIDE: 106 meq/L (ref 98–109)
CO2: 26 meq/L (ref 22–29)
Calcium: 9.3 mg/dL (ref 8.4–10.4)
Creatinine: 0.7 mg/dL (ref 0.6–1.1)
GLUCOSE: 96 mg/dL (ref 70–140)
POTASSIUM: 3.8 meq/L (ref 3.5–5.1)
SODIUM: 140 meq/L (ref 136–145)
Total Bilirubin: 0.4 mg/dL (ref 0.20–1.20)
Total Protein: 7.2 g/dL (ref 6.4–8.3)

## 2017-07-02 LAB — CBC WITH DIFFERENTIAL/PLATELET
BASO%: 0.4 % (ref 0.0–2.0)
Basophils Absolute: 0 10*3/uL (ref 0.0–0.1)
EOS%: 0.8 % (ref 0.0–7.0)
Eosinophils Absolute: 0.1 10*3/uL (ref 0.0–0.5)
HCT: 38.9 % (ref 34.8–46.6)
HEMOGLOBIN: 12.7 g/dL (ref 11.6–15.9)
LYMPH#: 2.9 10*3/uL (ref 0.9–3.3)
LYMPH%: 39.2 % (ref 14.0–49.7)
MCH: 30.8 pg (ref 25.1–34.0)
MCHC: 32.6 g/dL (ref 31.5–36.0)
MCV: 94.4 fL (ref 79.5–101.0)
MONO#: 0.6 10*3/uL (ref 0.1–0.9)
MONO%: 8.2 % (ref 0.0–14.0)
NEUT#: 3.8 10*3/uL (ref 1.5–6.5)
NEUT%: 51.4 % (ref 38.4–76.8)
Platelets: 263 10*3/uL (ref 145–400)
RBC: 4.12 10*6/uL (ref 3.70–5.45)
RDW: 16 % — ABNORMAL HIGH (ref 11.2–14.5)
WBC: 7.3 10*3/uL (ref 3.9–10.3)
nRBC: 0 % (ref 0–0)

## 2017-07-02 LAB — UA PROTEIN, DIPSTICK - CHCC: Protein, ur: NEGATIVE mg/dL

## 2017-07-02 LAB — CEA (IN HOUSE-CHCC): CEA (CHCC-In House): 6.78 ng/mL — ABNORMAL HIGH (ref 0.00–5.00)

## 2017-07-02 MED ORDER — DEXTROSE 5 % IV SOLN
140.0000 mg/m2 | Freq: Once | INTRAVENOUS | Status: AC
  Administered 2017-07-02: 340 mg via INTRAVENOUS
  Filled 2017-07-02: qty 17

## 2017-07-02 MED ORDER — ATROPINE SULFATE 1 MG/ML IJ SOLN
0.5000 mg | Freq: Once | INTRAMUSCULAR | Status: AC | PRN
  Administered 2017-07-02: 0.5 mg via INTRAVENOUS

## 2017-07-02 MED ORDER — SODIUM CHLORIDE 0.9 % IV SOLN
2000.0000 mg/m2 | INTRAVENOUS | Status: DC
  Administered 2017-07-02: 4900 mg via INTRAVENOUS
  Filled 2017-07-02: qty 98

## 2017-07-02 MED ORDER — PALONOSETRON HCL INJECTION 0.25 MG/5ML
0.2500 mg | Freq: Once | INTRAVENOUS | Status: AC
  Administered 2017-07-02: 0.25 mg via INTRAVENOUS

## 2017-07-02 MED ORDER — SODIUM CHLORIDE 0.9 % IV SOLN
Freq: Once | INTRAVENOUS | Status: AC
  Administered 2017-07-02: 11:00:00 via INTRAVENOUS

## 2017-07-02 MED ORDER — PALONOSETRON HCL INJECTION 0.25 MG/5ML
INTRAVENOUS | Status: AC
  Filled 2017-07-02: qty 5

## 2017-07-02 MED ORDER — BEVACIZUMAB CHEMO INJECTION 400 MG/16ML
4.7000 mg/kg | Freq: Once | INTRAVENOUS | Status: AC
  Administered 2017-07-02: 600 mg via INTRAVENOUS
  Filled 2017-07-02: qty 16

## 2017-07-02 MED ORDER — PROCHLORPERAZINE MALEATE 10 MG PO TABS
ORAL_TABLET | ORAL | Status: AC
  Filled 2017-07-02: qty 1

## 2017-07-02 MED ORDER — FOSAPREPITANT DIMEGLUMINE INJECTION 150 MG
Freq: Once | INTRAVENOUS | Status: AC
  Administered 2017-07-02: 12:00:00 via INTRAVENOUS
  Filled 2017-07-02: qty 5

## 2017-07-02 MED ORDER — PROCHLORPERAZINE MALEATE 10 MG PO TABS
10.0000 mg | ORAL_TABLET | Freq: Once | ORAL | Status: AC | PRN
  Administered 2017-07-02: 10 mg via ORAL

## 2017-07-02 MED ORDER — LEUCOVORIN CALCIUM INJECTION 350 MG
400.0000 mg/m2 | Freq: Once | INTRAVENOUS | Status: AC
  Administered 2017-07-02: 976 mg via INTRAVENOUS
  Filled 2017-07-02: qty 48.8

## 2017-07-02 MED ORDER — ATROPINE SULFATE 1 MG/ML IJ SOLN
INTRAMUSCULAR | Status: AC
  Filled 2017-07-02: qty 1

## 2017-07-02 MED ORDER — ACETAMINOPHEN-CODEINE #4 300-60 MG PO TABS: 1.0000 | ORAL_TABLET | Freq: Three times a day (TID) | ORAL | 0 refills | Status: DC | PRN

## 2017-07-02 NOTE — Patient Instructions (Signed)
[facility] Discharge Instructions for Patients Receiving Chemotherapy  Today you received the following chemotherapy agents: Avastin, Irinotecan and Leucovorin   To help prevent nausea and vomiting after your treatment, we encourage you to take your nausea medication as directed.    If you develop nausea and vomiting that is not controlled by your nausea medication, call the clinic.   BELOW ARE SYMPTOMS THAT SHOULD BE REPORTED IMMEDIATELY:  *FEVER GREATER THAN 100.5 F  *CHILLS WITH OR WITHOUT FEVER  NAUSEA AND VOMITING THAT IS NOT CONTROLLED WITH YOUR NAUSEA MEDICATION  *UNUSUAL SHORTNESS OF BREATH  *UNUSUAL BRUISING OR BLEEDING  TENDERNESS IN MOUTH AND THROAT WITH OR WITHOUT PRESENCE OF ULCERS  *URINARY PROBLEMS  *BOWEL PROBLEMS  UNUSUAL RASH Items with * indicate a potential emergency and should be followed up as soon as possible.  Feel free to call the clinic you have any questions or concerns. The clinic phone number is [phone].  Please show the [facility] at check-in to the Emergency Department and triage nurse.

## 2017-07-04 ENCOUNTER — Ambulatory Visit (HOSPITAL_BASED_OUTPATIENT_CLINIC_OR_DEPARTMENT_OTHER): Payer: BLUE CROSS/BLUE SHIELD

## 2017-07-04 VITALS — BP 152/93 | HR 62 | Temp 99.1°F | Resp 18

## 2017-07-04 DIAGNOSIS — C187 Malignant neoplasm of sigmoid colon: Secondary | ICD-10-CM | POA: Diagnosis not present

## 2017-07-04 MED ORDER — HEPARIN SOD (PORK) LOCK FLUSH 100 UNIT/ML IV SOLN
500.0000 [IU] | Freq: Once | INTRAVENOUS | Status: AC | PRN
  Administered 2017-07-04: 500 [IU]
  Filled 2017-07-04: qty 5

## 2017-07-04 MED ORDER — SODIUM CHLORIDE 0.9% FLUSH
10.0000 mL | INTRAVENOUS | Status: DC | PRN
  Administered 2017-07-04: 10 mL
  Filled 2017-07-04: qty 10

## 2017-07-04 NOTE — Progress Notes (Signed)
Pt came in today (07/04/17) with 5FU pump already stopped, and the appearance that it was not ever started. Communicated with Dr. Irene Limbo who was on-call this weekend. Pt refused to keep pump on today, and would be unable to come in Monday for pump d/c. Plan to remove pump today and re-evaluate on Monday if there should be a change in the next treatment. In-basket sent to Dr. Burr Medico, APP, and nurses assisting with pt care.

## 2017-07-04 NOTE — Patient Instructions (Signed)
Implanted Port Home Guide An implanted port is a type of central line that is placed under the skin. Central lines are used to provide IV access when treatment or nutrition needs to be given through a person's veins. Implanted ports are used for long-term IV access. An implanted port may be placed because:  You need IV medicine that would be irritating to the small veins in your hands or arms.  You need long-term IV medicines, such as antibiotics.  You need IV nutrition for a long period.  You need frequent blood draws for lab tests.  You need dialysis.  Implanted ports are usually placed in the chest area, but they can also be placed in the upper arm, the abdomen, or the leg. An implanted port has two main parts:  Reservoir. The reservoir is round and will appear as a small, raised area under your skin. The reservoir is the part where a needle is inserted to give medicines or draw blood.  Catheter. The catheter is a thin, flexible tube that extends from the reservoir. The catheter is placed into a large vein. Medicine that is inserted into the reservoir goes into the catheter and then into the vein.  How will I care for my incision site? Do not get the incision site wet. Bathe or shower as directed by your health care provider. How is my port accessed? Special steps must be taken to access the port:  Before the port is accessed, a numbing cream can be placed on the skin. This helps numb the skin over the port site.  Your health care provider uses a sterile technique to access the port. ? Your health care provider must put on a mask and sterile gloves. ? The skin over your port is cleaned carefully with an antiseptic and allowed to dry. ? The port is gently pinched between sterile gloves, and a needle is inserted into the port.  Only "non-coring" port needles should be used to access the port. Once the port is accessed, a blood return should be checked. This helps ensure that the port  is in the vein and is not clogged.  If your port needs to remain accessed for a constant infusion, a clear (transparent) bandage will be placed over the needle site. The bandage and needle will need to be changed every week, or as directed by your health care provider.  Keep the bandage covering the needle clean and dry. Do not get it wet. Follow your health care provider's instructions on how to take a shower or bath while the port is accessed.  If your port does not need to stay accessed, no bandage is needed over the port.  What is flushing? Flushing helps keep the port from getting clogged. Follow your health care provider's instructions on how and when to flush the port. Ports are usually flushed with saline solution or a medicine called heparin. The need for flushing will depend on how the port is used.  If the port is used for intermittent medicines or blood draws, the port will need to be flushed: ? After medicines have been given. ? After blood has been drawn. ? As part of routine maintenance.  If a constant infusion is running, the port may not need to be flushed.  How long will my port stay implanted? The port can stay in for as long as your health care provider thinks it is needed. When it is time for the port to come out, surgery will be   done to remove it. The procedure is similar to the one performed when the port was put in. When should I seek immediate medical care? When you have an implanted port, you should seek immediate medical care if:  You notice a bad smell coming from the incision site.  You have swelling, redness, or drainage at the incision site.  You have more swelling or pain at the port site or the surrounding area.  You have a fever that is not controlled with medicine.  This information is not intended to replace advice given to you by your health care provider. Make sure you discuss any questions you have with your health care provider. Document  Released: 09/01/2005 Document Revised: 02/07/2016 Document Reviewed: 05/09/2013 Elsevier Interactive Patient Education  2017 [employer]  

## 2017-07-06 NOTE — Progress Notes (Addendum)
The 5FU home infusion pump started on 10/18 was administered by Karen Chafe, RN with verification by Glade Nurse, RN.  While it was mistakenly scanned under Kathrynn Humble, RN it was not administered or verified by her.  RN Karen Chafe aware.

## 2017-07-08 ENCOUNTER — Telehealth: Payer: Self-pay | Admitting: Hematology

## 2017-07-08 NOTE — Telephone Encounter (Signed)
Spoke with patient and she was not able to come in today for her 5FU pump - she said she would have to wait until next Thursday because she is going out of town.

## 2017-07-10 ENCOUNTER — Other Ambulatory Visit: Payer: Self-pay | Admitting: Hematology

## 2017-07-10 ENCOUNTER — Other Ambulatory Visit: Payer: Self-pay | Admitting: Nurse Practitioner

## 2017-07-10 MED ORDER — AMLODIPINE BESYLATE 5 MG PO TABS: 5.0000 mg | ORAL_TABLET | Freq: Every day | ORAL | 0 refills | Status: DC

## 2017-07-15 NOTE — Progress Notes (Signed)
[place] ONCOLOGY OFFICE PROGRESS NOTE DATE OF VISIT: 07/16/2017   [name], [name], MD [address] [place] [address]  DIAGNOSIS: recurrent Cancer of sigmoid colon [facility]) - Plan: NM PET Image Restag (PS) Skull Base To Thigh, potassium chloride SA (K-DUR,KLOR-CON) 20 MEQ tablet  Malignant neoplasm of lower-outer quadrant of right breast of female, estrogen receptor positive ([place])  Needs flu shot - Plan: Influenza vac split quadrivalent PF (FLUARIX) injection 0.5 mL  PROBLEM LIST 1. Left breast DCIS diagnosed in 2006 and 2010, status post lumpectomy. 2. pT2N0M0 stage I colon cancer, s/p partial colectomy on 03/09/2014  3. PE in 12/2013 when she was diagnosed with colon cancer. 4. 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 [facility])   Staging form: Breast, AJCC 7th Edition     Clinical stage from 02/01/2015: Stage IA (T1a, N0, M0) - Signed by [name], MD on 06/04/2015     Pathologic stage from 11/10/2015: Stage IA (T1b, N0, M0) - Signed by [name], MD on 12/08/2015 Cancer of sigmoid colon [facility])   Staging form: Colon and Rectum, AJCC 7th Edition     Clinical: Stage I (T2, N0, M0) - Unsigned     Breast cancer of lower-outer quadrant of right female breast ([place])   2006 Cancer Diagnosis    Left breast DCIS diagnosed in 2006 and 2010, status post lumpectomy.      01/07/2014 Initial Diagnosis    Breast cancer      01/18/2015 Mammogram    38m mass in lower outer quadrant of right breast       02/01/2015 Initial Biopsy    (R) breast mass biopsy showed invasive ductal carcinoma & DCIS, grade 1-2.       02/01/2015 Receptors her2    ER 90%+, PR 10%+, Ki67 10%, HER2 negative (ratio 1.09)       11/07/2015 Surgery    (R) breast lumpectomy and sentinel lymph node biopsy ([name]      11/07/2015 Pathology Results    (R) breast lumpectomy showed invasive ductal  carcinoma, grade 2, 1 cm, low-grade DCIS, negative margins, 3 sentinel lymph nodes were negative. LVI (-). HER2 repeated and remains negative (ratio 1.29).        11/07/2015 Pathologic Stage    pT1b,pN0: Stage IA       11/07/2015 Oncotype testing    RS 22, which predicts a year risk of distant recurrence with tamoxifen alone 14% (intermediate-risk). Adjuvant chemo was not offered      02/21/2016 - 04/10/2016 Radiation Therapy    Adjuvant breast radiation. ([name]). Right breast: 50.4 Gy in 28 fractions.  Right breast boost: 12 Gy in 6 fractions.      05/2016 -  Anti-estrogen oral therapy    Femara (Letrozole) 2.5 mg daily.       10/17/2016 Mammogram    MM DIAG BREAST TOMO BILATERAL 10/17/2016 FINDINGS: There are bilateral lumpectomy changes in the upper central left breast and upper central right breast. No mass, nonsurgical distortion, or suspicious microcalcification is identified in either breast to suggest malignancy. Mammographic images were processed with CAD. IMPRESSION: No evidence of malignancy in either breast. Lumpectomy changes bilaterally. RECOMMENDATION: Diagnostic mammogram is suggested in 1 year. (Code:DM-B-01Y)       [place]   03/04/2014 Initial Diagnosis    Cancer of sigmoid colon      03/09/2014  Pathology Results    G1 invasive adenocarcinoma, no lymph-Vascular invasion or Peri-neural invasion: Absent. MMR normal, MSI stable.       03/09/2014 Surgery    partial colectomy, negative margins.       11/10/2014 Relapse/Recurrence    biopsy confirmed oligo liver recurrence       11/10/2014 Imaging    PET showed two small ares of hypermetabolism in the right lobe of the liver, no other distant metastasis.       11/30/2014 Imaging    abdomen MRI showed a new enhancing lesion which corresponds to an area of abnormal hyper metabolism on recent PET-CT. Stable enlarged portal caval lymph node.       01/02/2015 Pathology Results    Liver biopsy  showed metastatic adenocarcinoma, IHC (+) for CK20, CDX-2, (-) for TTF-1 and ER      01/02/2015 Miscellaneous    liver biopsy KRAS mutation (+)       02/14/2015 Imaging    CT showed:  the small metastatic right hepatic lobe liver lesion is not identified for certain on this examination. No new lesions. Stable small cysts in segment 4 a.       03/05/2015 - 04/16/2015 Chemotherapy    CAPEOX (capecitabine 2500 mg twice daily Day 1-14, oxaliplatin 1 30 mg/m on day 1, every 21 days, S/P 3 cycles       08/24/2015 Procedure    CT-guided oligo liver metastasis microwave ablation by Dr. [name]      06/17/2016 Imaging    CT of the chest/abd/pelvis with contrast 1. Several new small pulmonary nodules worrisome for metastatic disease. 2. New large area of probable infiltrating recurrent tumor in the right hepatic lobe surrounding the prior ablation site. 3. Small supraclavicular lymph nodes.  Attention on future studies. 4. Stable surgical changes involving both breasts. No breast masses are identified. 5. Stable bilateral adrenal gland nodules. 6. Stable upper abdominal and right-sided retroperitoneal lymph nodes.      07/08/2016 PET scan    1. Large mass in the right lobe of the liver demonstrates diffuse hypermetabolism, compatible with recurrent metastatic disease in the liver 2. Multiple small pulmonary nodules are very similar to the recent Chest CT 06/17/16.  3. No other findings of metastatic disease elsewhere in the neck, chest, abdomen, or pelvis. 4. There is some low-level hypermetabolic activity adjacent to the surgical clips in the lateral aspect of the right breast at the site of prior lumpectomy.       07/29/2016 Pathology Results    Liver biopsy confirmed metastatic colon cancer       07/31/2016 -  Chemotherapy    FOLFIRI every 2 weeks, Avastin added from cycle 2       10/23/2016 Imaging    CT ABDOMEN PELVIS W CONTRAST 10/24/16 IMPRESSION: 1. The numerous tiny bilateral  pulmonary nodules seen on the previous imaging exams are no longer evident, suggesting response of therapy. 2. Large ill-defined irregular right hepatic lesion measures smaller on today's study. 3. Slight increase in size of hepatoduodenal ligament lymphadenopathy. 4. Abdominal Aortic Atherosclerois (ICD10-170.0)      01/05/2017 Imaging    CT CAP IMPRESSION: 1. Left lower lobe pulmonary nodule and hepatic metastatic disease are stable. 2. Hepatoduodenal ligament lymph node is stable. 3. Aortic atherosclerosis (ICD10-170.0). Coronary artery calcification. 4. Enlarged pulmonary arteries, indicative of pulmonary arterial hypertension. 5. Small bilateral adrenal nodules are unchanged.      05/09/2017 Imaging    CT CAP IMPRESSION: 1. Stable appearance left  lower lobe pulmonary nodule with an apparent new tiny posterior right upper lobe pulmonary nodule. 2. Dominant ill-defined right hepatic mass measures smaller on today's study. 3. Slight decrease in the hepatoduodenal ligament lymph node. 4. Aortic Atherosclerois (ICD10-170.0)      CURRENT TREATMENT:   1. Letrozole 2.5 mg once daily, started in September 2017 2. Chemotherapy FOLFIRI and Avastin (from cycle 2) every 2 weeks, started on 07/31/2016, multiple delay per pt's request; chemo break from 5/30-6/21/18; decreased irinotecan to 161m/m2, 5-fu 20048mm2 from 03/05/17, due to poor tolerance.  INTERIM HISTORY:  Veronica TIETJE27.o. female with a history of recurrent left breast cancer, colon cancer s/p laparoscopic-assisted partial colectomy (03/09/2014), PE (01/06/2014), and right breast cancer (01/2015), presents to clinic for follow up. Her last treatment was about two weeks ago. She reports that following her treatments that she typically feels improved. She states that her pain has completely resolved over the past two weeks. Pt is requesting a CT scan today to evaluate her disease to see if she can forgo treatment today,  stating that she currently has a sore throat and she woke up with epistaxis this morning and has she had intermittent rhinorrhea. She is currently in a temporary apartment as her landlord recently sold her house. She is currently unhappy with this and looking for a house to stay in.   MEDICAL HISTORY: Past Medical History:  Diagnosis Date  . Anxiety   . Breast CA ([facility]   radiation and surgery-lt.  . Cancer ([place]   left breast cancer x2  . Hypertension   . Liver lesion   . [place]   "blood clot in lungs" -dx. 4'15 with CT Chest. On Xarelto  . Radiation 01/22/10-03/12/10   left whole breast 4500 cGy, upper aspect boosted to 6120 cGy  . Radiation 02/21/16 - 04/10/16   right breast 50.4 Gy, right breast boost 12 Gy  . Shortness of breath    sob with exertion. dx. with Pulmonary emboli 4'15 ,tx. Xarelto,Lovenox used for [employer]   ALLERGIES:  is allergic to tramadol.  MEDICATIONS:  Allergies as of 07/16/2017      Reactions   Tramadol Other (See Comments)   Dizziness      Medication List       Accurate as of 07/16/17  1:24 PM. Always use your most recent med list.          acetaminophen-codeine 300-60 MG tablet Commonly known as:  TYLENOL #4 Take 1 tablet by mouth every 8 (eight) hours as needed. for pain   amLODipine 5 MG tablet Commonly known as:  NORVASC Take 1 tablet (5 mg total) by mouth daily.   dexamethasone 4 MG tablet Commonly known as:  DECADRON Take 1 tablet (4 mg total) by mouth daily.   ferrous sulfate 325 (65 FE) MG tablet Take 325 mg by mouth daily with breakfast. Pt takes  4 times per week.   hydrochlorothiazide 25 MG tablet Commonly known as:  HYDRODIURIL Take 1 tablet (25 mg total) by mouth every morning.   HYDROcodone-acetaminophen 5-325 MG tablet Commonly known as:  NORCO Take 1 tablet by mouth every 8 (eight) hours as needed for moderate pain.   letrozole 2.5 MG tablet Commonly known as:  FEMARA TAKE 1 TABLET BY MOUTH EVERY  DAY   lidocaine-prilocaine cream Commonly known as:  EMLA Apply 1 application topically as needed.   LORazepam 0.5 MG tablet Commonly known as:  ATIVAN Take 1 tablet (  0.5 mg total) by mouth every 8 (eight) hours.   magic mouthwash w/lidocaine Soln Take 5 mLs by mouth 4 (four) times daily as needed for mouth pain.   metoprolol tartrate 50 MG tablet Commonly known as:  LOPRESSOR Take 1 tablet (50 mg total) by mouth 2 (two) times daily.   ondansetron 8 MG tablet Commonly known as:  ZOFRAN Take 1 tablet (8 mg total) by mouth 2 (two) times daily as needed for refractory nausea / vomiting. Start on day 3 after chemotherapy.   potassium chloride SA 20 MEQ tablet Commonly known as:  K-DUR,KLOR-CON Take 1 tablet (20 mEq total) by mouth 2 (two) times daily.   prochlorperazine 10 MG tablet Commonly known as:  COMPAZINE Take 1 tablet (10 mg total) by mouth every 6 (six) hours as needed (NAUSEA).   promethazine 25 MG tablet Commonly known as:  PHENERGAN Take 1 tablet (25 mg total) by mouth every 6 (six) hours as needed for nausea or vomiting.   XARELTO 20 MG Tabs tablet Generic drug:  rivaroxaban TAKE 1 TABLET BY MOUTH EVERY DAY   zolpidem 5 MG tablet Commonly known as:  AMBIEN Take 1 tablet (5 mg total) by mouth at bedtime as needed for sleep.       SURGICAL HISTORY:  Past Surgical History:  Procedure Laterality Date  . BREAST LUMPECTOMY WITH RADIOACTIVE SEED AND SENTINEL LYMPH NODE BIOPSY Right 11/07/2015   Procedure: BREAST LUMPECTOMY WITH RADIOACTIVE SEED AND SENTINEL LYMPH NODE BIOPSY;  Surgeon: Stark Klein, MD;  Location: [place];  Service: General;  Laterality: Right;  . BREAST SURGERY     '11- Left breast lumpectomy  . CESAREAN SECTION    . CHOLECYSTECTOMY     Laparoscopic 20 yrs ago  . COLON SURGERY  03-09-14   partial colectomy for cancer  . IR GENERIC HISTORICAL  07/29/2016   IR FLUORO GUIDE PORT INSERTION RIGHT 07/29/2016 Sandi Mariscal, MD  [facility]  . IR GENERIC HISTORICAL  07/29/2016   IR US GUIDE VASC ACCESS RIGHT 07/29/2016 Sandi Mariscal, MD [facility]  . IR GENERIC HISTORICAL  07/29/2016   IR US GUIDE BX ASP/DRAIN 07/29/2016 [facility]  . PARTIAL COLECTOMY N/A 03/09/2014   Procedure: LAPAROSCOPIC ASSISTED PARTIAL COLECTOMY, splenic flexure mobilization;  Surgeon: Leighton Ruff, MD;  Location[facility];  Service: General;  Laterality: N/A;  . RADIOFREQUENCY ABLATION Right 08/24/2015   Procedure: MICROWAVE ABLATION RIGHT LIVER LOBE ;  Surgeon: Corrie Mckusick, DO;  Location: [facility];  Service: Anesthesiology;  Laterality: Right;    REVIEW OF SYSTEMS:  Constitutional: Denies fevers, chills or abnormal weight loss (+) moderately fatigued Eyes: Denies blurriness of vision Ears, nose, mouth, throat, and face: Denies mucositis. (+) sore throat, rhinorrhea, and intermittent epistaxis  Respiratory: Denies cough, dyspnea or wheezes Cardiovascular: Denies palpitation, chest discomfort or lower extremity swelling Gastrointestinal:  Denies heartburn or change in bowel habits.  Skin: Denies abnormal skin rashes Musculoskeletal: Denies concerning joint pains.  Lymphatics: Denies new lymphadenopathy or easy bruising Neurological:Denies numbness, tingling or new weaknesses (+)numbness in right foot Musculoskeletal: normal Behavioral/Psych: Mood is stable, no new changes  All other systems were reviewed with the patient and are negative.    PHYSICAL EXAMINATION:  ECOG PERFORMANCE STATUS: 1-2  Blood pressure (!) 146/89, pulse 82, temperature 98.4 F (36.9 C), temperature source Oral, resp. rate 20, height '5\' 6"'  (1.676 m), weight 272 lb 14.4 oz (123.8 kg), last menstrual period 01/23/2014, SpO2 100 %.  Vitals:   07/16/17 1123  BP: (!) 146/89  Pulse: 82  Resp: 20  Temp: 98.4 F (36.9 C)  TempSrc: Oral  SpO2: 100%  Weight: 272 lb 14.4 oz (123.8 kg)  Height: '5\' 6"'  (1.676 m)    GENERAL:alert, no distress and comfortable; well  developed and obese. Easily mobile to exam table.  SKIN: skin color, texture, turgor are normal, no rashes or significant lesions EYES: normal, Conjunctiva are pink and non-injected, sclera clear OROPHARYNX:no exudate, no erythema and lips, buccal mucosa, and tongue normal  NECK: supple, thyroid normal size, non-tender, without nodularity LYMPH:  no palpable lymphadenopathy in the cervical, axillary or supraclavicular LUNGS: clear to auscultation with normal breathing effort, no wheezes or rhonchi HEART: regular rate & rhythm and no murmurs and no lower extremity edema ABDOMEN:abdomen soft, non-tender and normal bowel sounds; incision healing with signs of infection.  Mild tenderness at the  Right flank area, no abdominal tenderness. No local megaly Musculoskeletal:no cyanosis of digits and no clubbing, limited ROM of left shoulder  NEURO: alert & oriented x 3 with fluent speech, no focal motor/sensory deficits  Labs:  CBC Latest Ref Rng & Units 07/02/2017 06/18/2017 06/04/2017  WBC 3.9 - 10.3 10e3/uL 7.3 7.1 8.9  Hemoglobin 11.6 - 15.9 g/dL 12.7 12.6 13.1  Hematocrit 34.8 - 46.6 % 38.9 38.2 40.0  Platelets 145 - 400 10e3/uL 263 247 229    CMP Latest Ref Rng & Units 07/02/2017 06/18/2017 06/04/2017  Glucose 70 - 140 mg/dl 96 105 108  BUN 7.0 - 26.0 mg/dL 10.0 7.9 11.5  Creatinine 0.6 - 1.1 mg/dL 0.7 0.8 0.8  Sodium 136 - 145 mEq/L 140 139 142  Potassium 3.5 - 5.1 mEq/L 3.8 3.6 3.8  Chloride 101 - 111 mmol/L - - -  CO2 22 - 29 mEq/L '26 26 25  ' Calcium 8.4 - 10.4 mg/dL 9.3 9.6 10.0  Total Protein 6.4 - 8.3 g/dL 7.2 7.3 8.0  Total Bilirubin 0.20 - 1.20 mg/dL 0.40 0.45 0.63  Alkaline Phos 40 - 150 U/L 43 45 46  AST 5 - 34 U/L 26 27 44(H)  ALT 0 - 55 U/L '20 21 23    ' CEA  01/18/2014: 0.8 03/01/2015: 1.3 01/31/2016: 4.8 05/28/2016: 48 07/31/2016: 173.7 08/28/16: 139 09/25/2016: 43 11/04/16: 9.94 12/18/2016: 6.99 01/29/17: 4.58 03/04/17: 6.09 04/09/17: 4.22 05/07/17: 5.29 06/04/17:  4.12 07/02/17: 6.78  PATHOLOGY RESULT Diagnosis 01/02/2015 Liver, needle/core biopsy, right - POSITIVE FOR METASTATIC ADENOCARCINOMA. - SEE COMMENT.   Diagnosis 11/07/2015 1. Breast, lumpectomy, Right - INVASIVE DUCTAL CARCINOMA, GRADE 2, SPANNING 1 CM. - DUCTAL CARCINOMA IN SITU, LOW GRADE. - RESECTION MARGINS ARE NEGATIVE FOR INVASIVE CARCINOMA. - DUCTAL CARCINOMA IN SITU COMES TO WITHIN 0.3 CM OF THE POSTERIOR MARGIN. - BIOPSY SITE. - SEE ONCOLOGY TABLE. 2. Lymph node, sentinel, biopsy, right axillary #1 - ONE OF ONE LYMPH NODES NEGATIVE FOR CARCINOMA (0/1). 3. Lymph node, sentinel, biopsy, right axillary #2 - ONE OF ONE LYMPH NODES NEGATIVE FOR CARCINOMA (0/1). 4. Lymph node, sentinel, biopsy, right axillary #3 - ONE OF ONE LYMPH NODES NEGATIVE FOR CARCINOMA (0/1).  ONCOTYPE DX: RS 57, which predicts a year risk of distant recurrence with tamoxifen alone 14%   Diagnosis 07/29/2016 Liver, needle/core biopsy, posterior of right lobe METASTATIC ADENOCARCINOMA, CONSISTENT WITH COLONIC PRIMARY.  RADIOLOGY STUDIES   PET 07/08/2016 IMPRESSION: 1. Large mass in the right lobe of the liver demonstrates diffuse hypermetabolism, compatible with recurrent metastatic disease in the liver. 2. Multiple small pulmonary nodules are very similar to the recent chest  CT 06/17/2016. These do not demonstrate hypermetabolism on the PET images, however, these lesions are well below the level of PET imaging. Given that these nodules are new compared to prior chest CT 12/18/2015, they are concerning for potential metastatic disease to the lungs, and continued attention on followup studies is recommended. 3. No other findings of metastatic disease elsewhere in the neck, chest, abdomen or pelvis. 4. There is some low-level hypermetabolic activity adjacent to the surgical clips in the lateral aspect of the right breast at the site of prior lumpectomy. This area has undergone interval  involution over the several prior examinations, and this low-level activity is favored to be related to normal healing. Attention on follow-up imaging is recommended to ensure the stability or resolution of this finding. 5. Aortic atherosclerosis, in addition to 2 vessel coronary artery disease. Please note that although the presence of coronary artery calcium documents the presence of coronary artery disease, the severity of this disease and any potential stenosis cannot be assessed on this non-gated CT examination. Assessment for potential risk factor modification, dietary therapy or pharmacologic therapy may be warranted, if clinically indicated. 6. Additional incidental findings, as above.  MM DIAG BREAST TOMO BILATERAL 10/17/2016 FINDINGS: There are bilateral lumpectomy changes in the upper central left breast and upper central right breast. No mass, nonsurgical distortion, or suspicious microcalcification is identified in either breast to suggest malignancy. Mammographic images were processed with CAD. IMPRESSION: No evidence of malignancy in either breast. Lumpectomy changes bilaterally. RECOMMENDATION: Diagnostic mammogram is suggested in 1 year. (Code:DM-B-01Y)  CT CHEST, ABDOMEN PELVIS W CONTRAST 10/24/16 IMPRESSION: 1. The numerous tiny bilateral pulmonary nodules seen on the previous imaging exams are no longer evident, suggesting response of therapy. 2. Large ill-defined irregular right hepatic lesion measures smaller on today's study. 3. Slight increase in size of hepatoduodenal ligament lymphadenopathy. 4. Abdominal Aortic Atherosclerois (ICD10-170.0)  CT CAP 01/05/17 IMPRESSION: 1. Left lower lobe pulmonary nodule and hepatic metastatic disease are stable. 2. Hepatoduodenal ligament lymph node is stable. 3. Aortic atherosclerosis (ICD10-170.0). Coronary artery calcification. 4. Enlarged pulmonary arteries, indicative of pulmonary arterial hypertension. 5. Small  bilateral adrenal nodules are unchanged.  CT CAP 05/09/17 IMPRESSION: 1. Stable appearance left lower lobe pulmonary nodule with an apparent new tiny posterior right upper lobe pulmonary nodule. Continued attention on follow-up recommended. 2. Dominant ill-defined right hepatic mass measures smaller on today's study. 3. Slight decrease in the hepatoduodenal ligament lymph node. 4.  Aortic Atherosclerois (ICD10-170.0)  ASSESSMENT: Veronica Reyes 61 y.o. female   1. Colon Cancer, Stage I (pT2N0), MMR normal, oligo recurrent disease in liver in 12/2014, KRAS mutation (+), liver and lung recurrence in 06/2016 -I previously reviewed the nature history of metastatic colon cancer, and the potential curative approach with chemotherapy followed by liver lesion ablation or resection. However, more likely, this is not curable disease. -She initially received 3 cycles of [place], does not want to proceed with fourth cycle due to the moderate side effects from previous chemotherapy treatment. She declined liver mass resection, and underwent liver ablation by interventional radiologist Dr. Earleen Newport in 08/2015. - I previously reviewed her restaging CT scan from 06/17/2016, which unfortunately showed new area of infiltrative disease as the previous ablated liver lesion, likely recurrence. She also developed several new pulmonary nodules, small, concerning for metastatic disease. 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 --She has started chemotherapy FOLFIRI and avastin, has been tolerating moderately well, we'll continue every 2 weeks. -  Her CEA has significantly decreased since she started chemotherapy, likely responding to her good response to chemotherapy. -Repeat CT scan on 10/23/16 showed resolution of numerous bilateral pulmonary nodules, the right hepatic lesion is smaller, and slight decrease in size of hepatoduodenal ligament  lymphadenopathy. No other new lesions  -I previously reviewed her restaging CT scan from 01/05/2017, which showed stable pulmonary and liver metastasis, no other new lesions. -she has been tolerating chemo poorly overall, I have decreased her chemo dose a few times. She has also frequently requested chemotherapy break due to multiple reasons. -Due to her worsening nausea, I'll add IV Emend before chemotherapy today, and I previously called in Phenergan 25 mg as needed for her nausea at home. She uses Compazine also. She doesn't respond well to Zofran. -We reviewed her restaging CT scan from 05/09/2017, which showed continuous response to treatment, her liver metastasis has slightly decreased in size, no other new liver lesions. She still has a few small pulmonary nodules, we'll continue monitoring. -Per patient's request, I'll postpone her chemotherapy from today to next week. Patient is willing to stay on every two-week schedule from now on. -Lab reviewed, CBC and CMP are unremarkable, trending down again. She would like to skip her treatment today and restart again next week. I am okay with this but I strongly encouraged her to stay on scheduled next week to ensure continuity of treatment.  -will repeat restaging scan PET in 4 weeks   2. Right breast cancer, pT1bN0M0, stage Ia, ER+/PR+/HER2-, History of left Breast Cancer, diagnosed on 02/01/2015 --Left CIS in situ, s/p lumpectomy in 2006 and 2011 by Dr. Margot Chimes, and radiation in 2011 by Dr. Sondra Come.  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 Surgical margins were negative. -I previously reviewed her Oncotype DX result. Her recurrence score is 22, which predicts 14% 10 year risk of distant recurrence with tamoxifen alone. This is intermediate risk, based on her relatively low number, I do not recommend adjuvant chemotherapy. -She has completed adjuvant breast  radiation -her [place] level supports she is postmenopausal -She has started adjuvant letrozole, tolerating well, we'll continue. -We previously discussed breast cancer surveillance, I strongly encouraged her to continue annual screening mammogram, self exam, and follow-up with [place] routinely. -her recent mammogram was normal 10/2016, will continue once a year   3. History of PE (01/06/2014) --She has a diagnosis of pulmonary embolism, likely secondary to malignancy in the setting of family history for stroke (and possible stroke history in patient as well). Lower extremity dopplers negative.  -continue Xarelto. Due to her metastatic colon cancer, I previously recommended her to continue indefinitely.  4.  Uterine Fibroids  --Negative endometrial biopsy. Following with Gynecology.  5. Kidney lesion (Right). -No hypermetabolic right kidney lesion on the pet scan -Repeat abdominal MRI 05/29/2015 showed unchanged right renal lesion, favored to represent a complex/septated cyst.  6. Right flank pain - possible related to her liver metastasis -improved with chemo -Continue Tylenol No. 4 as needed for pain. Her abdominal pain overall has improved lately.  7.  Morbid obesity - I have previously encouraged her to eat healthy and exercise more.  8. HTN  -She will continue medication and following up with her PCP -We discussed that Avastin can increase her blood pressure, I recommend her to continue monitoring at home -She is mildly hypertensive today, we will increased her dose of Amlodipine to 78m if needed in the  future.   9. Nose bleeds - I have previously discussed the patient her nosebleeds could be related to Avastin and xarelto  -she knows to avoid blowing nose or excessive digital trauma  Plan  -cancel chemo today per pt's request -lab, flush, and chemo FOLFIRI and Avastatin in 1 and 4 weeks (postpone one week for Thanksgiving)  -F/u in 4 weeks with PET a few days before -Flu vaccine  today- -refill potassium today, she is taking twice daily, her potassium is normal today.  All questions were answered. The patient knows to call the clinic with any problems, questions or concerns. We can certainly see the patient much sooner if necessary.  I spent 20 minutes counseling the patient face to face. The total time spent in the appointment was 25 minutes.  This document serves as a record of services personally performed by [name], MD. It was created on her behalf by Reola Mosher, a trained medical scribe. The creation of this record is based on the scribe's personal observations and the provider's statements to them. This document has been checked and approved by the attending provider.  I have reviewed the above documentation for accuracy and completeness, and I agree with the above information.      [name]  07/16/2017

## 2017-07-16 ENCOUNTER — Encounter: Payer: Self-pay | Admitting: Hematology

## 2017-07-16 ENCOUNTER — Telehealth: Payer: Self-pay | Admitting: Hematology

## 2017-07-16 ENCOUNTER — Ambulatory Visit: Payer: BLUE CROSS/BLUE SHIELD

## 2017-07-16 ENCOUNTER — Ambulatory Visit (HOSPITAL_BASED_OUTPATIENT_CLINIC_OR_DEPARTMENT_OTHER): Payer: BLUE CROSS/BLUE SHIELD | Admitting: Hematology

## 2017-07-16 ENCOUNTER — Other Ambulatory Visit: Payer: Self-pay

## 2017-07-16 ENCOUNTER — Other Ambulatory Visit: Payer: BLUE CROSS/BLUE SHIELD

## 2017-07-16 VITALS — BP 146/89 | HR 82 | Temp 98.4°F | Resp 20 | Ht 66.0 in | Wt 272.9 lb

## 2017-07-16 DIAGNOSIS — C50511 Malignant neoplasm of lower-outer quadrant of right female breast: Secondary | ICD-10-CM | POA: Diagnosis not present

## 2017-07-16 DIAGNOSIS — Z170 Estrogen receptor positive status [ER+]: Secondary | ICD-10-CM | POA: Diagnosis not present

## 2017-07-16 DIAGNOSIS — Z23 Encounter for immunization: Secondary | ICD-10-CM | POA: Diagnosis not present

## 2017-07-16 DIAGNOSIS — C187 Malignant neoplasm of sigmoid colon: Secondary | ICD-10-CM

## 2017-07-16 MED ORDER — INFLUENZA VAC SPLIT QUAD 0.5 ML IM SUSY
0.5000 mL | PREFILLED_SYRINGE | Freq: Once | INTRAMUSCULAR | Status: AC
  Administered 2017-07-16: 0.5 mL via INTRAMUSCULAR
  Filled 2017-07-16: qty 0.5

## 2017-07-16 MED ORDER — POTASSIUM CHLORIDE CRYS ER 20 MEQ PO TBCR: 20.0000 meq | EXTENDED_RELEASE_TABLET | Freq: Two times a day (BID) | ORAL | 2 refills | Status: DC

## 2017-07-16 MED ORDER — LIDOCAINE-PRILOCAINE 2.5-2.5 % EX CREA: 1.0000 "application " | TOPICAL_CREAM | CUTANEOUS | 2 refills | Status: DC | PRN

## 2017-07-16 MED ORDER — MAGIC MOUTHWASH W/LIDOCAINE: 5.0000 mL | Freq: Four times a day (QID) | ORAL | 1 refills | Status: DC | PRN

## 2017-07-16 NOTE — Telephone Encounter (Signed)
Scheduled appt per 11/1 los - unable to schedule one week treatment due to capped day - already scheduled 4 week appt - message sent to [facility] for approval for next week .  Patient aware - did not give patient AVS or calender per patient request.

## 2017-07-17 ENCOUNTER — Telehealth: Payer: Self-pay | Admitting: Hematology

## 2017-07-17 NOTE — Telephone Encounter (Signed)
Voicemail not set up for messages - scheduled appt per 11/1 los - per YF okay to schedule nest treatment on 11/15 due to cap on 11/08 and patient only able to do Thursdays - sent reminder letter in the mail.

## 2017-07-21 ENCOUNTER — Encounter (HOSPITAL_COMMUNITY): Payer: Self-pay

## 2017-07-29 NOTE — Progress Notes (Signed)
[facility] PROGRESS NOTE DATE OF VISIT: 07/30/2017   Veronica Docker, MD 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, estrogen receptor positive ([place])  Cancer of sigmoid colon ([place]) - Plan: acetaminophen-codeine (TYLENOL #4) 300-60 MG tablet, DISCONTINUED: 0.9 %  sodium chloride infusion, DISCONTINUED: sodium chloride flush (NS) 0.9 % injection 10 mL, DISCONTINUED: heparin lock flush 100 unit/mL, DISCONTINUED: atropine injection 0.5 mg, DISCONTINUED: fosaprepitant (EMEND) 150 mg, dexamethasone (DECADRON) 10 mg in sodium chloride 0.9 % 145 mL IVPB, DISCONTINUED: prochlorperazine (COMPAZINE) tablet 10 mg, DISCONTINUED: palonosetron (ALOXI) injection 0.25 mg, DISCONTINUED: bevacizumab (AVASTIN) 600 mg in sodium chloride 0.9 % 100 mL chemo infusion, DISCONTINUED: irinotecan (CAMPTOSAR) 340 mg in dextrose 5 % 500 mL chemo infusion  Essential hypertension, benign  Obesity, Class III, BMI 40-49.9 (morbid obesity) ([name])  Personal history of venous thrombosis and embolism  PROBLEM LIST 1. Left breast DCIS diagnosed in 2006 and 2010, status post lumpectomy. 2. pT2N0M0 stage I colon cancer, s/p partial colectomy on 03/09/2014  3. PE in 12/2013 when she was diagnosed with colon cancer. 4. 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 [facility])   Staging form: Breast, AJCC 7th Edition     Clinical stage from 02/01/2015: Stage IA (T1a, N0, M0) - Signed by [name], MD on 06/04/2015     Pathologic stage from 11/10/2015: Stage IA (T1b, N0, M0) - Signed by [name], MD on 12/08/2015 Cancer of sigmoid colon [facility])   Staging form: Colon and Rectum, AJCC 7th Edition     Clinical: Stage I (T2, N0, M0) - Unsigned     Breast cancer of lower-outer quadrant of right female breast ([facility])   2006 Cancer  Diagnosis    Left breast DCIS diagnosed in 2006 and 2010, status post lumpectomy.      01/07/2014 Initial Diagnosis    Breast cancer      01/18/2015 Mammogram    35m mass in lower outer quadrant of right breast       02/01/2015 Initial Biopsy    (R) breast mass biopsy showed invasive ductal carcinoma & DCIS, grade 1-2.       02/01/2015 Receptors her2    ER 90%+, PR 10%+, Ki67 10%, HER2 negative (ratio 1.09)       11/07/2015 Surgery    (R) breast lumpectomy and sentinel lymph node biopsy ([name]      11/07/2015 Pathology Results    (R) breast lumpectomy showed invasive ductal carcinoma, grade 2, 1 cm, low-grade DCIS, negative margins, 3 sentinel lymph nodes were negative. LVI (-). HER2 repeated and remains negative (ratio 1.29).        11/07/2015 Pathologic Stage    pT1b,pN0: Stage IA       11/07/2015 Oncotype testing    RS 22, which predicts a year risk of distant recurrence with tamoxifen alone 14% (intermediate-risk). Adjuvant chemo was not offered      02/21/2016 - 04/10/2016 Radiation Therapy    Adjuvant breast radiation. ([name]). Right breast: 50.4 Gy in 28 fractions.  Right breast boost: 12 Gy in 6 fractions.      05/2016 -  Anti-estrogen oral therapy    Femara (Letrozole) 2.5 mg daily.       10/17/2016 Mammogram    MM DIAG BREAST TOMO BILATERAL 10/17/2016 FINDINGS: There are bilateral lumpectomy changes in  the upper central left breast and upper central right breast. No mass, nonsurgical distortion, or suspicious microcalcification is identified in either breast to suggest malignancy. Mammographic images were processed with CAD. IMPRESSION: No evidence of malignancy in either breast. Lumpectomy changes bilaterally. RECOMMENDATION: Diagnostic mammogram is suggested in 1 year. (Code:DM-B-01Y)       Cancer of sigmoid colon ([place])   03/04/2014 Initial Diagnosis    Cancer of sigmoid colon      03/09/2014 Pathology Results    G1 invasive adenocarcinoma, no lymph-Vascular  invasion or Peri-neural invasion: Absent. MMR normal, MSI stable.       03/09/2014 Surgery    partial colectomy, negative margins.       11/10/2014 Relapse/Recurrence    biopsy confirmed oligo liver recurrence       11/10/2014 Imaging    PET showed two small ares of hypermetabolism in the right lobe of the liver, no other distant metastasis.       11/30/2014 Imaging    abdomen MRI showed a new enhancing lesion which corresponds to an area of abnormal hyper metabolism on recent PET-CT. Stable enlarged portal caval lymph node.       01/02/2015 Pathology Results    Liver biopsy showed metastatic adenocarcinoma, IHC (+) for CK20, CDX-2, (-) for TTF-1 and ER      01/02/2015 Miscellaneous    liver biopsy KRAS mutation (+)       02/14/2015 Imaging    CT showed:  the small metastatic right hepatic lobe liver lesion is not identified for certain on this examination. No new lesions. Stable small cysts in segment 4 a.       03/05/2015 - 04/16/2015 Chemotherapy    CAPEOX (capecitabine 2500 mg twice daily Day 1-14, oxaliplatin 1 30 mg/m on day 1, every 21 days, S/P 3 cycles       08/24/2015 Procedure    CT-guided oligo liver metastasis microwave ablation by Dr. [name]      06/17/2016 Imaging    CT of the chest/abd/pelvis with contrast 1. Several new small pulmonary nodules worrisome for metastatic disease. 2. New large area of probable infiltrating recurrent tumor in the right hepatic lobe surrounding the prior ablation site. 3. Small supraclavicular lymph nodes.  Attention on future studies. 4. Stable surgical changes involving both breasts. No breast masses are identified. 5. Stable bilateral adrenal gland nodules. 6. Stable upper abdominal and right-sided retroperitoneal lymph nodes.      07/08/2016 PET scan    1. Large mass in the right lobe of the liver demonstrates diffuse hypermetabolism, compatible with recurrent metastatic disease in the liver 2. Multiple small pulmonary  nodules are very similar to the recent Chest CT 06/17/16.  3. No other findings of metastatic disease elsewhere in the neck, chest, abdomen, or pelvis. 4. There is some low-level hypermetabolic activity adjacent to the surgical clips in the lateral aspect of the right breast at the site of prior lumpectomy.       07/29/2016 Pathology Results    Liver biopsy confirmed metastatic colon cancer       07/31/2016 -  Chemotherapy    FOLFIRI every 2 weeks, Avastin added from cycle 2       10/23/2016 Imaging    CT ABDOMEN PELVIS W CONTRAST 10/24/16 IMPRESSION: 1. The numerous tiny bilateral pulmonary nodules seen on the previous imaging exams are no longer evident, suggesting response of therapy. 2. Large ill-defined irregular right hepatic lesion measures smaller on today's study. 3. Slight increase in size of  hepatoduodenal ligament lymphadenopathy. 4. Abdominal Aortic Atherosclerois (ICD10-170.0)      01/05/2017 Imaging    CT CAP IMPRESSION: 1. Left lower lobe pulmonary nodule and hepatic metastatic disease are stable. 2. Hepatoduodenal ligament lymph node is stable. 3. Aortic atherosclerosis (ICD10-170.0). Coronary artery calcification. 4. Enlarged pulmonary arteries, indicative of pulmonary arterial hypertension. 5. Small bilateral adrenal nodules are unchanged.      05/09/2017 Imaging    CT CAP IMPRESSION: 1. Stable appearance left lower lobe pulmonary nodule with an apparent new tiny posterior right upper lobe pulmonary nodule. 2. Dominant ill-defined right hepatic mass measures smaller on today's study. 3. Slight decrease in the hepatoduodenal ligament lymph node. 4. Aortic Atherosclerois (ICD10-170.0)      CURRENT TREATMENT:   1. Letrozole 2.5 mg once daily, started in September 2017 2. Chemotherapy FOLFIRI and Avastin (from cycle 2) every 2 weeks, started on 07/31/2016, multiple delay per pt's request; chemo break from 5/30-6/21/18; decreased irinotecan to 148m/m2, 5-fu  20074mm2 from 03/05/17, due to poor tolerance.  INTERIM HISTORY:   Veronica KAESTNER58.o. female with a history of recurrent left breast cancer, colon cancer s/p laparoscopic-assisted partial colectomy (03/09/2014), PE (01/06/2014), and right breast cancer (01/2015), presents to clinic for follow up.  She notes she is doing well but she feels there is a drag. She overall feels better than next time. She would like a refill for Tylenol #4, she takes 1 and occasionally 2 a day. She has been taking her Blood Pressure medication and her BP has improved. She notes to gaining weight from eating candy.  She notes when she stands her left knee with pop back in place. She walks and it will get better. This is worse in the morning after sleeping.  [place]. She is willing to do infusion but not the pump.    MEDICAL HISTORY: Past Medical History:  Diagnosis Date  . Anxiety   . [place]   radiation and surgery-lt.  . [place]   left breast cancer x2  . Hypertension   . Liver lesion   . [place]   "blood clot in lungs" -dx. 4'15 with CT Chest. On Xarelto  . Radiation 01/22/10-03/12/10   left whole breast 4500 cGy, upper aspect boosted to 6120 cGy  . Radiation 02/21/16 - 04/10/16   right breast 50.4 Gy, right breast boost 12 Gy  . Shortness of breath    sob with exertion. dx. with Pulmonary emboli 4'15 ,tx. Xarelto,Lovenox used for [employer]   ALLERGIES:  is allergic to tramadol.  MEDICATIONS:  Allergies as of 07/30/2017      Reactions   Tramadol Other (See Comments)   Dizziness      Medication List        Accurate as of 07/30/17 11:39 PM. Always use your most recent med list.          acetaminophen-codeine 300-60 MG tablet Commonly known as:  TYLENOL #4 Take 1 tablet every 8 (eight) hours as needed by mouth. for pain   amLODipine 5 MG tablet Commonly known as:  NORVASC Take 1 tablet (5  mg total) by mouth daily.   dexamethasone 4 MG tablet Commonly known as:  DECADRON Take 1 tablet (4 mg total) by mouth daily.   ferrous sulfate 325 (65 FE) MG tablet Take 325 mg by mouth daily with breakfast. Pt takes  4 times per week.  hydrochlorothiazide 25 MG tablet Commonly known as:  HYDRODIURIL Take 1 tablet (25 mg total) by mouth every morning.   HYDROcodone-acetaminophen 5-325 MG tablet Commonly known as:  NORCO Take 1 tablet by mouth every 8 (eight) hours as needed for moderate pain.   letrozole 2.5 MG tablet Commonly known as:  FEMARA TAKE 1 TABLET BY MOUTH EVERY DAY   lidocaine-prilocaine cream Commonly known as:  EMLA Apply 1 application topically as needed.   LORazepam 0.5 MG tablet Commonly known as:  ATIVAN Take 1 tablet (0.5 mg total) by mouth every 8 (eight) hours.   magic mouthwash w/lidocaine Soln Take 5 mLs by mouth 4 (four) times daily as needed for mouth pain.   metoprolol tartrate 50 MG tablet Commonly known as:  LOPRESSOR Take 1 tablet (50 mg total) by mouth 2 (two) times daily.   ondansetron 8 MG tablet Commonly known as:  ZOFRAN Take 1 tablet (8 mg total) by mouth 2 (two) times daily as needed for refractory nausea / vomiting. Start on day 3 after chemotherapy.   potassium chloride SA 20 MEQ tablet Commonly known as:  K-DUR,KLOR-CON Take 1 tablet (20 mEq total) by mouth 2 (two) times daily.   prochlorperazine 10 MG tablet Commonly known as:  COMPAZINE Take 1 tablet (10 mg total) by mouth every 6 (six) hours as needed (NAUSEA).   promethazine 25 MG tablet Commonly known as:  PHENERGAN Take 1 tablet (25 mg total) by mouth every 6 (six) hours as needed for nausea or vomiting.   XARELTO 20 MG Tabs tablet Generic drug:  rivaroxaban TAKE 1 TABLET BY MOUTH EVERY DAY   zolpidem 5 MG tablet Commonly known as:  AMBIEN Take 1 tablet (5 mg total) by mouth at bedtime as needed for sleep.       SURGICAL HISTORY:  Past Surgical History:    Procedure Laterality Date  . BREAST LUMPECTOMY WITH RADIOACTIVE SEED AND SENTINEL LYMPH NODE BIOPSY Right 11/07/2015   Procedure: BREAST LUMPECTOMY WITH RADIOACTIVE SEED AND SENTINEL LYMPH NODE BIOPSY;  Surgeon: Stark Klein, MD;  Location: [facility];  Service: General;  Laterality: Right;  . BREAST SURGERY     '11- Left breast lumpectomy  . CESAREAN SECTION    . CHOLECYSTECTOMY     Laparoscopic 20 yrs ago  . COLON SURGERY  03-09-14   partial colectomy for cancer  . IR GENERIC HISTORICAL  07/29/2016   IR FLUORO GUIDE PORT INSERTION RIGHT 07/29/2016 Sandi Mariscal, MD [facility]  . IR GENERIC HISTORICAL  07/29/2016   IR US GUIDE VASC ACCESS RIGHT 07/29/2016 Sandi Mariscal, MD [facility]  . IR GENERIC HISTORICAL  07/29/2016   IR US GUIDE BX ASP/DRAIN 07/29/2016 [facility]  . PARTIAL COLECTOMY N/A 03/09/2014   Procedure: LAPAROSCOPIC ASSISTED PARTIAL COLECTOMY, splenic flexure mobilization;  Surgeon: Leighton Ruff, MD;  Location: [facility];  Service: General;  Laterality: N/A;  . RADIOFREQUENCY ABLATION Right 08/24/2015   Procedure: MICROWAVE ABLATION RIGHT LIVER LOBE ;  Surgeon: Corrie Mckusick, DO;  Location: [facility];  Service: Anesthesiology;  Laterality: Right;    REVIEW OF SYSTEMS: Constitutional: Denies fevers, chills or abnormal weight loss (+) mild fatigued (+) weight gain Eyes: Denies blurriness of vision Ears, nose, mouth, throat, and face: Denies mucositis.  Respiratory: Denies cough, dyspnea or wheezes Cardiovascular: Denies palpitation, chest discomfort or lower extremity swelling Gastrointestinal:  Denies heartburn or change in bowel habits.  Skin: Denies abnormal skin rashes Musculoskeletal: Denies concerning joint pains.  Lymphatics: Denies new lymphadenopathy  or easy bruising Neurological:Denies numbness, tingling or new weaknesses Musculoskeletal: (+) left knee discomfort  Behavioral/Psych: Mood is stable, no new changes  All other systems were reviewed with  the patient and are negative.    PHYSICAL EXAMINATION:  ECOG PERFORMANCE STATUS: 1-2  Blood pressure (!) 150/89, pulse 77, temperature 98.7 F (37.1 C), temperature source Oral, resp. rate 18, height _0  (1.676 m), weight 277 lb (125.6 kg), last menstrual period 01/23/2014, SpO2 100 %.  Vitals:   07/30/17 1152  BP: (!) 150/89  Pulse: 77  Resp: 18  Temp: 98.7 F (37.1 C)  TempSrc: Oral  SpO2: 100%  Weight: 277 lb (125.6 kg)  Height: _1  (1.676 m)    GENERAL:alert, no distress and comfortable; well developed and obese. Easily mobile to exam table.  SKIN: skin color, texture, turgor are normal, no rashes or significant lesions EYES: normal, Conjunctiva are pink and non-injected, sclera clear OROPHARYNX:no exudate, no erythema and lips, buccal mucosa, and tongue normal  NECK: supple, thyroid normal size, non-tender, without nodularity LYMPH:  no palpable lymphadenopathy in the cervical, axillary or supraclavicular LUNGS: clear to auscultation with normal breathing effort, no wheezes or rhonchi HEART: regular rate & rhythm and no murmurs and no lower extremity edema ABDOMEN:abdomen soft, non-tender and normal bowel sounds; incision healing with signs of infection.  Mild tenderness at the  Right flank area, no abdominal tenderness. No local megaly Musculoskeletal:no cyanosis of digits and no clubbing, limited ROM of left shoulder  NEURO: alert & oriented x 3 with fluent speech, no focal motor/sensory deficits  Labs:  CBC Latest Ref Rng & Units 07/30/2017 07/02/2017 06/18/2017  WBC 3.9 - 10.3 10e3/uL 9.8 7.3 7.1  Hemoglobin 11.6 - 15.9 g/dL 13.2 12.7 12.6  Hematocrit 34.8 - 46.6 % 40.2 38.9 38.2  Platelets 145 - 400 10e3/uL 259 263 247    CMP Latest Ref Rng & Units 07/30/2017 07/02/2017 06/18/2017  Glucose 70 - 140 mg/dl 114 96 105  BUN 7.0 - 26.0 mg/dL 7.0 10.0 7.9  Creatinine 0.6 - 1.1 mg/dL 0.8 0.7 0.8  Sodium 136 - 145 mEq/L 140 140 139  Potassium 3.5 - 5.1 mEq/L 3.7 3.8  3.6  Chloride 101 - 111 mmol/L - - -  CO2 22 - 29 mEq/L _2 Calcium 8.4 - 10.4 mg/dL 9.8 9.3 9.6  Total Protein 6.4 - 8.3 g/dL 7.9 7.2 7.3  Total Bilirubin 0.20 - 1.20 mg/dL 0.41 0.40 0.45  Alkaline Phos 40 - 150 U/L 45 43 45  AST 5 - 34 U/L 35(H) 26 27  ALT 0 - 55 U/L _3 CEA  01/18/2014: 0.8 03/01/2015: 1.3 01/31/2016: 4.8 05/28/2016: 48 07/31/2016: 173.7 08/28/16: 139 09/25/2016: 43 11/04/16: 9.94 12/18/2016: 6.99 01/29/17: 4.58 03/04/17: 6.09 04/09/17: 4.22 05/07/17: 5.29 06/04/17: 4.12 07/02/17: 6.78 07/30/17 : PENDING   PATHOLOGY RESULT Diagnosis 01/02/2015 Liver, needle/core biopsy, right - POSITIVE FOR METASTATIC ADENOCARCINOMA. - SEE COMMENT.   Diagnosis 11/07/2015 1. Breast, lumpectomy, Right - INVASIVE DUCTAL CARCINOMA, GRADE 2, SPANNING 1 CM. - DUCTAL CARCINOMA IN SITU, LOW GRADE. - RESECTION MARGINS ARE NEGATIVE FOR INVASIVE CARCINOMA. - DUCTAL CARCINOMA IN SITU COMES TO WITHIN 0.3 CM OF THE POSTERIOR MARGIN. - BIOPSY SITE. - SEE ONCOLOGY TABLE. 2. Lymph node, sentinel, biopsy, right axillary #1 - ONE OF ONE LYMPH NODES NEGATIVE FOR CARCINOMA (0/1). 3. Lymph node, sentinel, biopsy, right axillary #2 - ONE OF ONE LYMPH NODES NEGATIVE FOR CARCINOMA (0/1). 4. Lymph node, sentinel, biopsy,  right axillary #3 - ONE OF ONE LYMPH NODES NEGATIVE FOR CARCINOMA (0/1).  ONCOTYPE DX: RS 43, which predicts a year risk of distant recurrence with tamoxifen alone 14%   Diagnosis 07/29/2016 Liver, needle/core biopsy, posterior of right lobe METASTATIC ADENOCARCINOMA, CONSISTENT WITH COLONIC PRIMARY.  RADIOLOGY STUDIES   PET 07/08/2016 IMPRESSION: 1. Large mass in the right lobe of the liver demonstrates diffuse hypermetabolism, compatible with recurrent metastatic disease in the liver. 2. Multiple small pulmonary nodules are very similar to the recent chest CT 06/17/2016. These do not demonstrate hypermetabolism on the PET images, however, these lesions  are well below the level of PET imaging. Given that these nodules are new compared to prior chest CT 12/18/2015, they are concerning for potential metastatic disease to the lungs, and continued attention on followup studies is recommended. 3. No other findings of metastatic disease elsewhere in the neck, chest, abdomen or pelvis. 4. There is some low-level hypermetabolic activity adjacent to the surgical clips in the lateral aspect of the right breast at the site of prior lumpectomy. This area has undergone interval involution over the several prior examinations, and this low-level activity is favored to be related to normal healing. Attention on follow-up imaging is recommended to ensure the stability or resolution of this finding. 5. Aortic atherosclerosis, in addition to 2 vessel coronary artery disease. Please note that although the presence of coronary artery calcium documents the presence of coronary artery disease, the severity of this disease and any potential stenosis cannot be assessed on this non-gated CT examination. Assessment for potential risk factor modification, dietary therapy or pharmacologic therapy may be warranted, if clinically indicated. 6. Additional incidental findings, as above.  MM DIAG BREAST TOMO BILATERAL 10/17/2016 FINDINGS: There are bilateral lumpectomy changes in the upper central left breast and upper central right breast. No mass, nonsurgical distortion, or suspicious microcalcification is identified in either breast to suggest malignancy. Mammographic images were processed with CAD. IMPRESSION: No evidence of malignancy in either breast. Lumpectomy changes bilaterally. RECOMMENDATION: Diagnostic mammogram is suggested in 1 year. (Code:DM-B-01Y)  CT CHEST, ABDOMEN PELVIS W CONTRAST 10/24/16 IMPRESSION: 1. The numerous tiny bilateral pulmonary nodules seen on the previous imaging exams are no longer evident, suggesting response of therapy. 2. Large  ill-defined irregular right hepatic lesion measures smaller on today's study. 3. Slight increase in size of hepatoduodenal ligament lymphadenopathy. 4. Abdominal Aortic Atherosclerois (ICD10-170.0)  CT CAP 01/05/17 IMPRESSION: 1. Left lower lobe pulmonary nodule and hepatic metastatic disease are stable. 2. Hepatoduodenal ligament lymph node is stable. 3. Aortic atherosclerosis (ICD10-170.0). Coronary artery calcification. 4. Enlarged pulmonary arteries, indicative of pulmonary arterial hypertension. 5. Small bilateral adrenal nodules are unchanged.  CT CAP 05/09/17 IMPRESSION: 1. Stable appearance left lower lobe pulmonary nodule with an apparent new tiny posterior right upper lobe pulmonary nodule. Continued attention on follow-up recommended. 2. Dominant ill-defined right hepatic mass measures smaller on today's study. 3. Slight decrease in the hepatoduodenal ligament lymph node. 4.  Aortic Atherosclerois (ICD10-170.0)  ASSESSMENT: Veronica Reyes 61 y.o. female   1. Colon Cancer, Stage I (pT2N0), MMR normal, oligo recurrent disease in liver in 12/2014, KRAS mutation (+), liver and lung recurrence in 06/2016 -I previously reviewed the nature history of metastatic colon cancer, and the potential curative approach with chemotherapy followed by liver lesion ablation or resection. However, more likely, this is not curable disease. -She initially received 3 cycles of Capox, does not want to proceed with fourth cycle due to the moderate side effects from previous  chemotherapy treatment. She declined liver mass resection, and underwent liver ablation by interventional radiologist Dr. Earleen Newport in 08/2015. - I previously reviewed her restaging CT scan from 06/17/2016, which unfortunately showed new area of infiltrative disease as the previous ablated liver lesion, likely recurrence. She also developed several new pulmonary nodules, small, concerning for metastatic disease. -We previously  discussed her liver biopsy from 07/29/16: It revealed metastatic adenocarcinoma of the colon. --She has started chemotherapy FOLFIRI and avastin, has been tolerating moderately well, we'll continue every 2 weeks. -Her CEA has significantly decreased since she started chemotherapy, likely responding to her good response to chemotherapy. -Repeat CT scan on 10/23/16 showed resolution of numerous bilateral pulmonary nodules, the right hepatic lesion is smaller, and slight decrease in size of hepatoduodenal ligament lymphadenopathy. No other new lesions  -I previously reviewed her restaging CT scan from 01/05/2017, which showed stable pulmonary and liver metastasis, no other new lesions. -she has been tolerating chemo poorly overall, I have decreased her chemo dose a few times. She has also frequently requested chemotherapy break due to multiple reasons. -Due to her worsening nausea, I'll add IV Emend before chemotherapy today, and I previously called in Phenergan 25 mg as needed for her nausea at home. She uses Compazine also. She doesn't respond well to Zofran. -We reviewed her restaging CT scan from 05/09/2017, which showed continuous response to treatment, her liver metastasis has slightly decreased in size, no other new liver lesions. She still has a few small pulmonary nodules, we'll continue monitoring. -Due to her trip tomorrow she request to proceed with Irinotecan and Avastin today without the 5-fu pump. -Patient would like to switch to maintenance therapy if possible, will decide after her next restaging scan. Will consider Xeloda in place of 5-Fu after next scan.  -Labs reviewed and adequate to proceed with treatment -F/u in 2 weeks with PET scan   2. Right breast cancer, pT1bN0M0, stage Ia, ER+/PR+/HER2-, History of left Breast Cancer, diagnosed on 02/01/2015 --Left CIS in situ, s/p lumpectomy in 2006 and 2011 by Dr. Margot Chimes, and radiation in 2011 by Dr. Sondra Come.  -I previously reviewed her  surgical pathology results from 11/10/2015, which showed a 1 cm grade 2 invasive ductal carcinoma and DCIS. Surgical margins were negative. -I previously reviewed her Oncotype DX result. Her recurrence score is 22, which predicts 14% 10 year risk of distant recurrence with tamoxifen alone. This is intermediate risk, based on her relatively low number, I do not recommend adjuvant chemotherapy. -She has completed adjuvant breast radiation -her Bennett level supports she is postmenopausal -She has started adjuvant letrozole in 05/2016, tolerating well, we'll continue. -We previously discussed breast cancer surveillance, I strongly encouraged her to continue annual screening mammogram, self exam, and follow-up with [place] routinely. -her 10/2016 mammogram was normal, currently there is no clinical concern for recurrence.  -Repeat mammogram in 10/2017   3. History of PE (01/06/2014) --She has a diagnosis of pulmonary embolism, likely secondary to malignancy in the setting of family history for stroke (and possible stroke history in patient as well). Lower extremity dopplers negative.  -continue Xarelto. Due to her metastatic colon cancer, I previously recommended her to continue indefinitely.  4.  Uterine Fibroids  --Negative endometrial biopsy. Following with Gynecology.  5. Kidney lesion (Right). -No hypermetabolic right kidney lesion on the pet scan -Repeat abdominal MRI 05/29/2015 showed unchanged right renal lesion, favored to represent a complex/septate cyst.  6. 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 4 as needed for pain. Her abdominal pain overall has improved lately. -Controled with tylenol #4, refilled today (07/30/17) -I suggest she see a orthopedist for her knee issues.   7.  Morbid obesity - I have previously encouraged her to eat healthy and exercise more.  8. HTN  -She will continue medication and following up  with her PCP -We discussed that Avastin can increase her blood pressure, I recommend her to continue monitoring at home -She was mildly hypertensive on 07/16/17, we will  increase her dose of Amlodipine to 10m if needed in the future.   9. Nose bleeds - I have previously discussed the patient her nosebleeds could be related to Avastin and Xarelto  -she knows to avoid blowing nose or excessive digital trauma -Not mentioned today, likely improved  Plan  -Refill Tylenol #4 today  -Labs adequate to proceed with Irinotecan and avastin, no 5-fu pump or leucovorin today per pt's request due to her trip tomorrow  -PET on 08/11/17 -Lab, flush, f/u and FOLFIRI and avastin in 2 weeks    All questions were answered. The patient knows to call the clinic with any problems, questions or concerns. We can certainly see the patient much sooner if necessary.  I spent 20 minutes counseling the patient face to face. The total time spent in the appointment was 25 minutes.  This document serves as a record of services personally performed by YTruitt Merle MD. It was created on her behalf by AJoslyn Devon a trained medical scribe. The creation of this record is based on the scribe's personal observations and the provider's statements to them.    I have reviewed the above documentation for accuracy and completeness, and I agree with the above.     FTruitt Merle 07/30/2017

## 2017-07-30 ENCOUNTER — Other Ambulatory Visit (HOSPITAL_BASED_OUTPATIENT_CLINIC_OR_DEPARTMENT_OTHER): Payer: BLUE CROSS/BLUE SHIELD

## 2017-07-30 ENCOUNTER — Ambulatory Visit (HOSPITAL_BASED_OUTPATIENT_CLINIC_OR_DEPARTMENT_OTHER): Payer: BLUE CROSS/BLUE SHIELD

## 2017-07-30 ENCOUNTER — Ambulatory Visit (HOSPITAL_BASED_OUTPATIENT_CLINIC_OR_DEPARTMENT_OTHER): Payer: BLUE CROSS/BLUE SHIELD | Admitting: Hematology

## 2017-07-30 ENCOUNTER — Ambulatory Visit: Payer: BLUE CROSS/BLUE SHIELD

## 2017-07-30 VITALS — BP 150/89 | HR 77 | Temp 98.7°F | Resp 18 | Ht 66.0 in | Wt 277.0 lb

## 2017-07-30 DIAGNOSIS — Z170 Estrogen receptor positive status [ER+]: Secondary | ICD-10-CM

## 2017-07-30 DIAGNOSIS — Z79899 Other long term (current) drug therapy: Secondary | ICD-10-CM | POA: Diagnosis not present

## 2017-07-30 DIAGNOSIS — C50511 Malignant neoplasm of lower-outer quadrant of right female breast: Secondary | ICD-10-CM

## 2017-07-30 DIAGNOSIS — C187 Malignant neoplasm of sigmoid colon: Secondary | ICD-10-CM

## 2017-07-30 DIAGNOSIS — Z86718 Personal history of other venous thrombosis and embolism: Secondary | ICD-10-CM | POA: Diagnosis not present

## 2017-07-30 DIAGNOSIS — C787 Secondary malignant neoplasm of liver and intrahepatic bile duct: Secondary | ICD-10-CM

## 2017-07-30 DIAGNOSIS — Z5111 Encounter for antineoplastic chemotherapy: Secondary | ICD-10-CM

## 2017-07-30 DIAGNOSIS — Z5112 Encounter for antineoplastic immunotherapy: Secondary | ICD-10-CM

## 2017-07-30 DIAGNOSIS — I10 Essential (primary) hypertension: Secondary | ICD-10-CM

## 2017-07-30 LAB — COMPREHENSIVE METABOLIC PANEL
ALT: 26 U/L (ref 0–55)
ANION GAP: 11 meq/L (ref 3–11)
AST: 35 U/L — ABNORMAL HIGH (ref 5–34)
Albumin: 3.2 g/dL — ABNORMAL LOW (ref 3.5–5.0)
Alkaline Phosphatase: 45 U/L (ref 40–150)
BUN: 7 mg/dL (ref 7.0–26.0)
CALCIUM: 9.8 mg/dL (ref 8.4–10.4)
CHLORIDE: 106 meq/L (ref 98–109)
CO2: 24 meq/L (ref 22–29)
Creatinine: 0.8 mg/dL (ref 0.6–1.1)
EGFR: >60 mL/min/{1.73_m2} (ref >60)
Glucose: 114 mg/dl (ref 70–140)
Potassium: 3.7 mEq/L (ref 3.5–5.1)
Sodium: 140 mEq/L (ref 136–145)
Total Bilirubin: 0.41 mg/dL (ref 0.20–1.20)
Total Protein: 7.9 g/dL (ref 6.4–8.3)

## 2017-07-30 LAB — CBC WITH DIFFERENTIAL/PLATELET
BASO%: 0.8 % (ref 0.0–2.0)
BASOS ABS: 0.1 10*3/uL (ref 0.0–0.1)
EOS ABS: 0 10*3/uL (ref 0.0–0.5)
EOS%: 0.5 % (ref 0.0–7.0)
HEMATOCRIT: 40.2 % (ref 34.8–46.6)
HGB: 13.2 g/dL (ref 11.6–15.9)
LYMPH#: 2.7 10*3/uL (ref 0.9–3.3)
LYMPH%: 27.2 % (ref 14.0–49.7)
MCH: 30.8 pg (ref 25.1–34.0)
MCHC: 32.8 g/dL (ref 31.5–36.0)
MCV: 93.9 fL (ref 79.5–101.0)
MONO#: 0.7 10*3/uL (ref 0.1–0.9)
MONO%: 6.8 % (ref 0.0–14.0)
NEUT#: 6.3 10*3/uL (ref 1.5–6.5)
NEUT%: 64.7 % (ref 38.4–76.8)
PLATELETS: 259 10*3/uL (ref 145–400)
RBC: 4.28 10*6/uL (ref 3.70–5.45)
RDW: 17.6 % — ABNORMAL HIGH (ref 11.2–14.5)
WBC: 9.8 10*3/uL (ref 3.9–10.3)

## 2017-07-30 LAB — CEA (IN HOUSE-CHCC): CEA (CHCC-IN HOUSE): 5.35 ng/mL — AB (ref 0.00–5.00)

## 2017-07-30 LAB — UA PROTEIN, DIPSTICK - CHCC: PROTEIN: NEGATIVE mg/dL

## 2017-07-30 MED ORDER — ACETAMINOPHEN-CODEINE #4 300-60 MG PO TABS: 1.0000 | ORAL_TABLET | Freq: Three times a day (TID) | ORAL | 0 refills | Status: DC | PRN

## 2017-07-30 MED ORDER — SODIUM CHLORIDE 0.9 % IV SOLN
Freq: Once | INTRAVENOUS | Status: AC
  Administered 2017-07-30: 13:00:00 via INTRAVENOUS
  Filled 2017-07-30: qty 5

## 2017-07-30 MED ORDER — SODIUM CHLORIDE 0.9 % IV SOLN
Freq: Once | INTRAVENOUS | Status: AC
  Administered 2017-07-30: 13:00:00 via INTRAVENOUS

## 2017-07-30 MED ORDER — SODIUM CHLORIDE 0.9 % IV SOLN
4.7000 mg/kg | Freq: Once | INTRAVENOUS | Status: AC
  Administered 2017-07-30: 600 mg via INTRAVENOUS
  Filled 2017-07-30: qty 16

## 2017-07-30 MED ORDER — PALONOSETRON HCL INJECTION 0.25 MG/5ML
0.2500 mg | Freq: Once | INTRAVENOUS | Status: AC
  Administered 2017-07-30: 0.25 mg via INTRAVENOUS

## 2017-07-30 MED ORDER — PALONOSETRON HCL INJECTION 0.25 MG/5ML
INTRAVENOUS | Status: AC
  Filled 2017-07-30: qty 5

## 2017-07-30 MED ORDER — ATROPINE SULFATE 1 MG/ML IJ SOLN
INTRAMUSCULAR | Status: AC
  Filled 2017-07-30: qty 1

## 2017-07-30 MED ORDER — HEPARIN SOD (PORK) LOCK FLUSH 100 UNIT/ML IV SOLN
500.0000 [IU] | Freq: Once | INTRAVENOUS | Status: AC | PRN
  Administered 2017-07-30: 500 [IU]
  Filled 2017-07-30: qty 5

## 2017-07-30 MED ORDER — ATROPINE SULFATE 1 MG/ML IJ SOLN
0.5000 mg | Freq: Once | INTRAMUSCULAR | Status: AC | PRN
  Administered 2017-07-30: 0.5 mg via INTRAVENOUS

## 2017-07-30 MED ORDER — SODIUM CHLORIDE 0.9% FLUSH
10.0000 mL | INTRAVENOUS | Status: DC | PRN
  Administered 2017-07-30: 10 mL
  Filled 2017-07-30: qty 10

## 2017-07-30 MED ORDER — PROCHLORPERAZINE MALEATE 10 MG PO TABS
10.0000 mg | ORAL_TABLET | Freq: Once | ORAL | Status: AC | PRN
  Administered 2017-07-30: 10 mg via ORAL

## 2017-07-30 MED ORDER — IRINOTECAN HCL CHEMO INJECTION 100 MG/5ML
140.0000 mg/m2 | Freq: Once | INTRAVENOUS | Status: AC
  Administered 2017-07-30: 340 mg via INTRAVENOUS
  Filled 2017-07-30: qty 17

## 2017-07-30 MED ORDER — PROCHLORPERAZINE MALEATE 10 MG PO TABS
ORAL_TABLET | ORAL | Status: AC
  Filled 2017-07-30: qty 1

## 2017-07-30 NOTE — Patient Instructions (Signed)
[facility] Discharge Instructions for Patients Receiving Chemotherapy  Today you received the following chemotherapy agents :  Avastin,  Camptosar.  To help prevent nausea and vomiting after your treatment, we encourage you to take your nausea medication as prescribed.   If you develop nausea and vomiting that is not controlled by your nausea medication, call the clinic.   BELOW ARE SYMPTOMS THAT SHOULD BE REPORTED IMMEDIATELY:  *FEVER GREATER THAN 100.5 F  *CHILLS WITH OR WITHOUT FEVER  NAUSEA AND VOMITING THAT IS NOT CONTROLLED WITH YOUR NAUSEA MEDICATION  *UNUSUAL SHORTNESS OF BREATH  *UNUSUAL BRUISING OR BLEEDING  TENDERNESS IN MOUTH AND THROAT WITH OR WITHOUT PRESENCE OF ULCERS  *URINARY PROBLEMS  *BOWEL PROBLEMS  UNUSUAL RASH Items with * indicate a potential emergency and should be followed up as soon as possible.  Feel free to call the clinic should you have any questions or concerns. The clinic phone number is [phone].  Please show the [facility] at check-in to the Emergency Department and triage nurse.

## 2017-07-30 NOTE — Patient Instructions (Signed)

## 2017-07-30 NOTE — Progress Notes (Signed)
Pt saw Dr. Burr Medico today prior to chemo.  OK to treat with AST  35 as per Dr. Burr Medico.

## 2017-07-31 ENCOUNTER — Encounter: Payer: Self-pay | Admitting: Hematology

## 2017-08-04 ENCOUNTER — Other Ambulatory Visit: Payer: Self-pay | Admitting: Hematology

## 2017-08-04 ENCOUNTER — Telehealth: Payer: Self-pay | Admitting: *Deleted

## 2017-08-04 DIAGNOSIS — C187 Malignant neoplasm of sigmoid colon: Secondary | ICD-10-CM

## 2017-08-04 NOTE — Telephone Encounter (Signed)
Spoke with pt and informed her re: PET scan was denied by her insurance.  Dr. Burr Medico has ordered CT scans, and awaiting insurance approval.  Pt understood that radiology scheduler will contact pt for appt after insurance approval.

## 2017-08-04 NOTE — Telephone Encounter (Signed)
-----   Message from [name], MD sent at 08/04/2017  4:27 PM EST ----- Regarding: RE: Peer/Peer Review requested  I will change to CT scans, please obtain pre-cert for CTs, thanks much  Thu, please let pt know, thanks   Krista Blue   ----- Message ----- From: Carolan Clines Sent: 08/04/2017   2:32 PM To: [name], MD Subject: Peer/Peer Review requested                     Hello Dr. Burr Medico,  PET scan denied. If you would like to initiate a  peer to peer, please see information below.   Phone: [facility] [phone] option #1, then #2 KITIARA, HINTZE [[id number]] DOB 1956-02-19 Member ID # [id number]  Please let me know how you want to proceed.   Thank you,  Tia

## 2017-08-11 ENCOUNTER — Ambulatory Visit (HOSPITAL_COMMUNITY)
Admission: RE | Admit: 2017-08-11 | Discharge: 2017-08-11 | Disposition: A | Discharge status: Home / Self Care | Payer: BLUE CROSS/BLUE SHIELD | Source: Ambulatory Visit | Attending: Hematology | Admitting: Hematology

## 2017-08-11 ENCOUNTER — Encounter (HOSPITAL_COMMUNITY): Payer: Self-pay

## 2017-08-11 ENCOUNTER — Ambulatory Visit (HOSPITAL_COMMUNITY): Payer: BLUE CROSS/BLUE SHIELD

## 2017-08-11 DIAGNOSIS — M47816 Spondylosis without myelopathy or radiculopathy, lumbar region: Secondary | ICD-10-CM | POA: Diagnosis not present

## 2017-08-11 DIAGNOSIS — R160 Hepatomegaly, not elsewhere classified: Secondary | ICD-10-CM | POA: Insufficient documentation

## 2017-08-11 DIAGNOSIS — D259 Leiomyoma of uterus, unspecified: Secondary | ICD-10-CM | POA: Insufficient documentation

## 2017-08-11 DIAGNOSIS — I517 Cardiomegaly: Secondary | ICD-10-CM | POA: Diagnosis not present

## 2017-08-11 DIAGNOSIS — R918 Other nonspecific abnormal finding of lung field: Secondary | ICD-10-CM | POA: Insufficient documentation

## 2017-08-11 DIAGNOSIS — I2510 Atherosclerotic heart disease of native coronary artery without angina pectoris: Secondary | ICD-10-CM | POA: Insufficient documentation

## 2017-08-11 DIAGNOSIS — N281 Cyst of kidney, acquired: Secondary | ICD-10-CM | POA: Diagnosis not present

## 2017-08-11 DIAGNOSIS — C187 Malignant neoplasm of sigmoid colon: Secondary | ICD-10-CM | POA: Diagnosis present

## 2017-08-11 DIAGNOSIS — I059 Rheumatic mitral valve disease, unspecified: Secondary | ICD-10-CM | POA: Diagnosis not present

## 2017-08-11 DIAGNOSIS — R590 Localized enlarged lymph nodes: Secondary | ICD-10-CM | POA: Insufficient documentation

## 2017-08-11 DIAGNOSIS — M5136 Other intervertebral disc degeneration, lumbar region: Secondary | ICD-10-CM | POA: Diagnosis not present

## 2017-08-11 MED ORDER — IOPAMIDOL (ISOVUE-300) INJECTION 61%
INTRAVENOUS | Status: AC
  Filled 2017-08-11: qty 100

## 2017-08-11 MED ORDER — IOPAMIDOL (ISOVUE-300) INJECTION 61%
100.0000 mL | Freq: Once | INTRAVENOUS | Status: AC | PRN
  Administered 2017-08-11: 100 mL via INTRAVENOUS

## 2017-08-12 NOTE — Progress Notes (Signed)
[facility] PROGRESS NOTE DATE OF VISIT: 08/13/2017   [name], MD 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 Left breast DCIS diagnosed in 2006 and 2010, status post lumpectomy. 2. pT2N0M0 stage I colon cancer, s/p partial colectomy on 03/09/2014  3. PE in 12/2013 when she was diagnosed with colon cancer. 4. 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 [facility])   Staging form: Breast, AJCC 7th Edition     Clinical stage from 02/01/2015: Stage IA (T1a, N0, M0) - Signed by [name], MD on 06/04/2015     Pathologic stage from 11/10/2015: Stage IA (T1b, N0, M0) - Signed by [name], MD on 12/08/2015 Cancer of sigmoid colon [facility])   Staging form: Colon and Rectum, AJCC 7th Edition     Clinical: Stage I (T2, N0, M0) - Unsigned     Breast cancer of lower-outer quadrant of right female breast ([place])   2006 Cancer Diagnosis    Left breast DCIS diagnosed in 2006 and 2010, status post lumpectomy.      01/07/2014 Initial Diagnosis    Breast cancer      01/18/2015 Mammogram    53m mass in lower outer quadrant of right breast       02/01/2015 Initial Biopsy    (R) breast mass biopsy showed invasive ductal carcinoma & DCIS, grade 1-2.       02/01/2015 Receptors her2    ER 90%+, PR 10%+, Ki67 10%, HER2 negative (ratio 1.09)       11/07/2015 Surgery    (R) breast lumpectomy and sentinel lymph node biopsy ([name]      11/07/2015 Pathology Results    (R) breast lumpectomy showed invasive ductal carcinoma, grade 2, 1 cm, low-grade DCIS, negative margins, 3 sentinel lymph nodes were negative. LVI (-). HER2 repeated and remains negative  (ratio 1.29).        11/07/2015 Pathologic Stage    pT1b,pN0: Stage IA       11/07/2015 Oncotype testing    RS 22, which predicts a year risk of distant recurrence with tamoxifen alone 14% (intermediate-risk). Adjuvant chemo was not offered      02/21/2016 - 04/10/2016 Radiation Therapy    Adjuvant breast radiation. ([name]). Right breast: 50.4 Gy in 28 fractions.  Right breast boost: 12 Gy in 6 fractions.      05/2016 -  Anti-estrogen oral therapy    Femara (Letrozole) 2.5 mg daily.       10/17/2016 Mammogram    MM DIAG BREAST TOMO BILATERAL 10/17/2016 FINDINGS: There are bilateral lumpectomy changes in the upper central left breast and upper central right breast. No mass, nonsurgical distortion, or suspicious microcalcification is identified in either breast to suggest malignancy. Mammographic images were processed with CAD. IMPRESSION: No evidence of malignancy in either breast. Lumpectomy changes bilaterally. RECOMMENDATION: Diagnostic mammogram is suggested in 1 year. (Code:DM-B-01Y)       [place]   03/04/2014 Initial Diagnosis    Cancer of sigmoid colon      03/09/2014 Pathology Results    G1 invasive adenocarcinoma, no lymph-Vascular invasion or Peri-neural invasion: Absent. MMR normal, MSI stable.  03/09/2014 Surgery    partial colectomy, negative margins.       11/10/2014 Relapse/Recurrence    biopsy confirmed oligo liver recurrence       11/10/2014 Imaging    PET showed two small ares of hypermetabolism in the right lobe of the liver, no other distant metastasis.       11/30/2014 Imaging    abdomen MRI showed a new enhancing lesion which corresponds to an area of abnormal hyper metabolism on recent PET-CT. Stable enlarged portal caval lymph node.       01/02/2015 Pathology Results    Liver biopsy showed metastatic adenocarcinoma, IHC (+) for CK20, CDX-2, (-) for TTF-1 and ER      01/02/2015 Miscellaneous    liver biopsy KRAS mutation (+)        02/14/2015 Imaging    CT showed:  the small metastatic right hepatic lobe liver lesion is not identified for certain on this examination. No new lesions. Stable small cysts in segment 4 a.       03/05/2015 - 04/16/2015 Chemotherapy    CAPEOX (capecitabine 2500 mg twice daily Day 1-14, oxaliplatin 1 30 mg/m on day 1, every 21 days, S/P 3 cycles       08/24/2015 Procedure    CT-guided oligo liver metastasis microwave ablation by Dr. [name]      06/17/2016 Imaging    CT of the chest/abd/pelvis with contrast 1. Several new small pulmonary nodules worrisome for metastatic disease. 2. New large area of probable infiltrating recurrent tumor in the right hepatic lobe surrounding the prior ablation site. 3. Small supraclavicular lymph nodes.  Attention on future studies. 4. Stable surgical changes involving both breasts. No breast masses are identified. 5. Stable bilateral adrenal gland nodules. 6. Stable upper abdominal and right-sided retroperitoneal lymph nodes.      07/08/2016 PET scan    1. Large mass in the right lobe of the liver demonstrates diffuse hypermetabolism, compatible with recurrent metastatic disease in the liver 2. Multiple small pulmonary nodules are very similar to the recent Chest CT 06/17/16.  3. No other findings of metastatic disease elsewhere in the neck, chest, abdomen, or pelvis. 4. There is some low-level hypermetabolic activity adjacent to the surgical clips in the lateral aspect of the right breast at the site of prior lumpectomy.       07/29/2016 Pathology Results    Liver biopsy confirmed metastatic colon cancer       07/31/2016 -  Chemotherapy    Chemotherapy FOLFIRI and Avastin (from cycle 2) and Leucovorin (added from cycle 8) every 2 weeks, started on 07/31/2016, multiple delay per pt's request; chemo break from 5/30-6/21/18; decreased leucovorin to 2000 mg/m2, irinotecan to 165m/m2, 5-fu 20017mm2 from 03/05/17, due to poor tolerance. Started  chemo break since 07/31/17.       10/23/2016 Imaging    CT ABDOMEN PELVIS W CONTRAST 10/24/16 IMPRESSION: 1. The numerous tiny bilateral pulmonary nodules seen on the previous imaging exams are no longer evident, suggesting response of therapy. 2. Large ill-defined irregular right hepatic lesion measures smaller on today's study. 3. Slight increase in size of hepatoduodenal ligament lymphadenopathy. 4. Abdominal Aortic Atherosclerois (ICD10-170.0)      01/05/2017 Imaging    CT CAP IMPRESSION: 1. Left lower lobe pulmonary nodule and hepatic metastatic disease are stable. 2. Hepatoduodenal ligament lymph node is stable. 3. Aortic atherosclerosis (ICD10-170.0). Coronary artery calcification. 4. Enlarged pulmonary arteries, indicative of pulmonary arterial hypertension. 5. Small bilateral adrenal nodules are unchanged.  05/09/2017 Imaging    CT CAP IMPRESSION: 1. Stable appearance left lower lobe pulmonary nodule with an apparent new tiny posterior right upper lobe pulmonary nodule. 2. Dominant ill-defined right hepatic mass measures smaller on today's study. 3. Slight decrease in the hepatoduodenal ligament lymph node. 4. Aortic Atherosclerois (ICD10-170.0)      05/09/2017 Imaging    CT CAP 05/09/17 IMPRESSION: 1. Stable appearance left lower lobe pulmonary nodule with an apparent new tiny posterior right upper lobe pulmonary nodule. Continued attention on follow-up recommended. 2. Dominant ill-defined right hepatic mass measures smaller on today's study. 3. Slight decrease in the hepatoduodenal ligament lymph node. 4.  Aortic Atherosclerois (ICD10-170.0)       08/11/2017 Imaging    CT CAP W Contrast 08/11/17 IMPRESSION: 1. Essentially stable appearance of small pulmonary nodules, hypodense mass laterally in the right hepatic lobe, and mild portacaval adenopathy. No new or progressive lesions. 2. Other imaging findings of potential clinical significance:  Aortic Atherosclerosis (ICD10-I70.0). Lower lumbar impingement due to spondylosis and degenerative disc disease. Uterine fibroids. Bilateral renal cysts. Coronary atherosclerosis. Mild mitral valve calcification. Mild cardiomegaly.        CURRENT TREATMENT:   1. Letrozole 2.5 mg once daily, started in September 2017 2. Chemotherapy FOLFIRI and Avastin (from cycle 2) and Leucovorin (added from cycle 8) every 2 weeks, started on 07/31/2016, multiple delay per pt's request; chemo break from 5/30-6/21/18; decreased leucovorin to 2000 mg/m2, irinotecan to 160m/m2, 5-fu 20081mm2 from 03/05/17, due to poor tolerance. Started chemo break since 07/31/17.   INTERIM HISTORY:   JeDESMOND TUFANO84.o. female with a history of recurrent left breast cancer, colon cancer s/p laparoscopic-assisted partial colectomy (03/09/2014), PE (01/06/2014), and right breast cancer (01/2015), presents to clinic for follow up.  She notes she does not want to take anymore treatments until the new year. She notes the last infusion made her tired. She will move when she has to and tries to eat well and exercise. She has gained well, now 280lbs. She wants to start Xeloda. She would like a refill of Tylenol #4. She no longer takes hydrocodone. She has been taking amlodipine and would like a refill. She is no longer taking antiemetics. She takes potassium BID and denies any diarrhea.    MEDICAL HISTORY: Past Medical History:  Diagnosis Date  . Anxiety   . Breast CA ([facility]   radiation and surgery-lt.  . Cancer ([place]   left breast cancer x2  . Hypertension   . Liver lesion   . [place]monary [place] ([place]   "blood clot in lungs" -dx. 4'15 with CT Chest. On Xarelto  . Radiation 01/22/10-03/12/10   left whole breast 4500 cGy, upper aspect boosted to 6120 cGy  . Radiation 02/21/16 - 04/10/16   right breast 50.4 Gy, right breast boost 12 Gy  . Shortness of breath    sob with exertion. dx. with Pulmonary emboli 4'15  ,tx. Xarelto,Lovenox used for [employer]   ALLERGIES:  is allergic to tramadol.  MEDICATIONS:  Allergies as of 08/13/2017      Reactions   Tramadol Other (See Comments)   Dizziness      Medication List        Accurate as of 08/13/17 11:17 PM. Always use your most recent med list.          acetaminophen-codeine 300-60 MG tablet Commonly known as:  TYLENOL #4 Take 1 tablet by mouth every 8 (eight) hours as needed. for pain  amLODipine 5 MG tablet Commonly known as:  NORVASC Take 1 tablet (5 mg total) by mouth daily.   dexamethasone 4 MG tablet Commonly known as:  DECADRON Take 1 tablet (4 mg total) by mouth daily.   ferrous sulfate 325 (65 FE) MG tablet Take 325 mg by mouth daily with breakfast. Pt takes  4 times per week.   hydrochlorothiazide 25 MG tablet Commonly known as:  HYDRODIURIL Take 1 tablet (25 mg total) by mouth every morning.   letrozole 2.5 MG tablet Commonly known as:  FEMARA TAKE 1 TABLET BY MOUTH EVERY DAY   lidocaine-prilocaine cream Commonly known as:  EMLA Apply 1 application topically as needed.   LORazepam 0.5 MG tablet Commonly known as:  ATIVAN Take 1 tablet (0.5 mg total) by mouth every 8 (eight) hours.   magic mouthwash w/lidocaine Soln Take 5 mLs by mouth 4 (four) times daily as needed for mouth pain.   metoprolol tartrate 50 MG tablet Commonly known as:  LOPRESSOR Take 1 tablet (50 mg total) by mouth 2 (two) times daily.   ondansetron 8 MG tablet Commonly known as:  ZOFRAN Take 1 tablet (8 mg total) by mouth 2 (two) times daily as needed for refractory nausea / vomiting. Start on day 3 after chemotherapy.   potassium chloride SA 20 MEQ tablet Commonly known as:  K-DUR,KLOR-CON Take 1 tablet (20 mEq total) by mouth 2 (two) times daily.   prochlorperazine 10 MG tablet Commonly known as:  COMPAZINE Take 1 tablet (10 mg total) by mouth every 6 (six) hours as needed (NAUSEA).   promethazine 25 MG tablet Commonly known as:   PHENERGAN Take 1 tablet (25 mg total) by mouth every 6 (six) hours as needed for nausea or vomiting.   XARELTO 20 MG Tabs tablet Generic drug:  rivaroxaban TAKE 1 TABLET BY MOUTH EVERY DAY   zolpidem 5 MG tablet Commonly known as:  AMBIEN Take 1 tablet (5 mg total) by mouth at bedtime as needed for sleep.       SURGICAL HISTORY:  Past Surgical History:  Procedure Laterality Date  . BREAST LUMPECTOMY WITH RADIOACTIVE SEED AND SENTINEL LYMPH NODE BIOPSY Right 11/07/2015   Procedure: BREAST LUMPECTOMY WITH RADIOACTIVE SEED AND SENTINEL LYMPH NODE BIOPSY;  Surgeon: Stark Klein, MD;  Location: [place];  Service: General;  Laterality: Right;  . BREAST SURGERY     '11- Left breast lumpectomy  . CESAREAN SECTION    . CHOLECYSTECTOMY     Laparoscopic 20 yrs ago  . COLON SURGERY  03-09-14   partial colectomy for cancer  . IR GENERIC HISTORICAL  07/29/2016   IR FLUORO GUIDE PORT INSERTION RIGHT 07/29/2016 Sandi Mariscal, MD [facility]  . IR GENERIC HISTORICAL  07/29/2016   IR US GUIDE VASC ACCESS RIGHT 07/29/2016 Sandi Mariscal, MD [facility]  . IR GENERIC HISTORICAL  07/29/2016   IR US GUIDE BX ASP/DRAIN 07/29/2016 [facility]  . PARTIAL COLECTOMY N/A 03/09/2014   Procedure: LAPAROSCOPIC ASSISTED PARTIAL COLECTOMY, splenic flexure mobilization;  Surgeon: Leighton Ruff, MD;  Location: [facility];  Service: General;  Laterality: N/A;  . RADIOFREQUENCY ABLATION Right 08/24/2015   Procedure: MICROWAVE ABLATION RIGHT LIVER LOBE ;  Surgeon: Corrie Mckusick, DO;  Location: [facility];  Service: Anesthesiology;  Laterality: Right;    REVIEW OF SYSTEMS:  Constitutional: Denies fevers, chills or abnormal weight loss (+) mild fatigued (+) weight gain Eyes: Denies blurriness of vision Ears, nose, mouth, throat, and face: Denies mucositis.  Respiratory:  Denies cough, dyspnea or wheezes Cardiovascular: Denies palpitation, chest discomfort or lower extremity swelling Gastrointestinal:  Denies  heartburn or change in bowel habits.  Skin: Denies abnormal skin rashes Musculoskeletal: Denies concerning joint pains.  Lymphatics: Denies new lymphadenopathy or easy bruising Neurological:Denies numbness, tingling or new weaknesses Musculoskeletal: (+) left knee discomfort  Behavioral/Psych: Mood is stable, no new changes  All other systems were reviewed with the patient and are negative.    PHYSICAL EXAMINATION:  ECOG PERFORMANCE STATUS: 1-2  Blood pressure (!) 153/82, pulse 77, temperature 98.7 F (37.1 C), temperature source Oral, resp. rate 18, height '5\' 6"'  (1.676 m), weight 280 lb 1.6 oz (127.1 kg), last menstrual period 01/23/2014, SpO2 98 %.  Vitals:   08/13/17 1142  BP: (!) 153/82  Pulse: 77  Resp: 18  Temp: 98.7 F (37.1 C)  TempSrc: Oral  SpO2: 98%  Weight: 280 lb 1.6 oz (127.1 kg)  Height: '5\' 6"'  (1.676 m)    GENERAL:alert, no distress and comfortable; well developed and obese. Easily mobile to exam table.  SKIN: skin color, texture, turgor are normal, no rashes or significant lesions EYES: normal, Conjunctiva are pink and non-injected, sclera clear OROPHARYNX:no exudate, no erythema and lips, buccal mucosa, and tongue normal  NECK: supple, thyroid normal size, non-tender, without nodularity LYMPH:  no palpable lymphadenopathy in the cervical, axillary or supraclavicular LUNGS: clear to auscultation with normal breathing effort, no wheezes or rhonchi HEART: regular rate & rhythm and no murmurs and no lower extremity edema ABDOMEN:abdomen soft, non-tender and normal bowel sounds; incision healing with signs of infection.  Mild tenderness at the  Right flank area, no abdominal tenderness. No local megaly Musculoskeletal:no cyanosis of digits and no clubbing, limited ROM of left shoulder  NEURO: alert & oriented x 3 with fluent speech, no focal motor/sensory deficits  Labs:  CBC Latest Ref Rng & Units 08/13/2017 07/30/2017 07/02/2017  WBC 3.9 - 10.3 10e3/uL 7.8 9.8  7.3  Hemoglobin 11.6 - 15.9 g/dL 12.5 13.2 12.7  Hematocrit 34.8 - 46.6 % 38.0 40.2 38.9  Platelets 145 - 400 10e3/uL 263 259 263    CMP Latest Ref Rng & Units 08/13/2017 07/30/2017 07/02/2017  Glucose 70 - 140 mg/dl 103 114 96  BUN 7.0 - 26.0 mg/dL 7.8 7.0 10.0  Creatinine 0.6 - 1.1 mg/dL 0.8 0.8 0.7  Sodium 136 - 145 mEq/L 141 140 140  Potassium 3.5 - 5.1 mEq/L 3.7 3.7 3.8  Chloride 101 - 111 mmol/L - - -  CO2 22 - 29 mEq/L '24 24 26  ' Calcium 8.4 - 10.4 mg/dL 9.4 9.8 9.3  Total Protein 6.4 - 8.3 g/dL 7.3 7.9 7.2  Total Bilirubin 0.20 - 1.20 mg/dL 0.32 0.41 0.40  Alkaline Phos 40 - 150 U/L 43 45 43  AST 5 - 34 U/L 35(H) 35(H) 26  ALT 0 - 55 U/L 34 26 20    CEA  01/18/2014: 0.8 03/01/2015: 1.3 01/31/2016: 4.8 05/28/2016: 48 07/31/2016: 173.7 08/28/16: 139 09/25/2016: 43 11/04/16: 9.94 12/18/2016: 6.99 01/29/17: 4.58 03/04/17: 6.09 04/09/17: 4.22 05/07/17: 5.29 06/04/17: 4.12 07/02/17: 6.78 07/30/17 : 5.35   PATHOLOGY RESULT Diagnosis 01/02/2015 Liver, needle/core biopsy, right - POSITIVE FOR METASTATIC ADENOCARCINOMA. - SEE COMMENT.   Diagnosis 11/07/2015 1. Breast, lumpectomy, Right - INVASIVE DUCTAL CARCINOMA, GRADE 2, SPANNING 1 CM. - DUCTAL CARCINOMA IN SITU, LOW GRADE. - RESECTION MARGINS ARE NEGATIVE FOR INVASIVE CARCINOMA. - DUCTAL CARCINOMA IN SITU COMES TO WITHIN 0.3 CM OF THE POSTERIOR MARGIN. - BIOPSY SITE. - SEE  ONCOLOGY TABLE. 2. Lymph node, sentinel, biopsy, right axillary #1 - ONE OF ONE LYMPH NODES NEGATIVE FOR CARCINOMA (0/1). 3. Lymph node, sentinel, biopsy, right axillary #2 - ONE OF ONE LYMPH NODES NEGATIVE FOR CARCINOMA (0/1). 4. Lymph node, sentinel, biopsy, right axillary #3 - ONE OF ONE LYMPH NODES NEGATIVE FOR CARCINOMA (0/1).  ONCOTYPE DX: RS 38, which predicts a year risk of distant recurrence with tamoxifen alone 14%   Diagnosis 07/29/2016 Liver, needle/core biopsy, posterior of right lobe METASTATIC ADENOCARCINOMA, CONSISTENT WITH  COLONIC PRIMARY.  RADIOLOGY STUDIES   PET 07/08/2016 IMPRESSION: 1. Large mass in the right lobe of the liver demonstrates diffuse hypermetabolism, compatible with recurrent metastatic disease in the liver. 2. Multiple small pulmonary nodules are very similar to the recent chest CT 06/17/2016. These do not demonstrate hypermetabolism on the PET images, however, these lesions are well below the level of PET imaging. Given that these nodules are new compared to prior chest CT 12/18/2015, they are concerning for potential metastatic disease to the lungs, and continued attention on followup studies is recommended. 3. No other findings of metastatic disease elsewhere in the neck, chest, abdomen or pelvis. 4. There is some low-level hypermetabolic activity adjacent to the surgical clips in the lateral aspect of the right breast at the site of prior lumpectomy. This area has undergone interval involution over the several prior examinations, and this low-level activity is favored to be related to normal healing. Attention on follow-up imaging is recommended to ensure the stability or resolution of this finding. 5. Aortic atherosclerosis, in addition to 2 vessel coronary artery disease. Please note that although the presence of coronary artery calcium documents the presence of coronary artery disease, the severity of this disease and any potential stenosis cannot be assessed on this non-gated CT examination. Assessment for potential risk factor modification, dietary therapy or pharmacologic therapy may be warranted, if clinically indicated. 6. Additional incidental findings, as above.  MM DIAG BREAST TOMO BILATERAL 10/17/2016 FINDINGS: There are bilateral lumpectomy changes in the upper central left breast and upper central right breast. No mass, nonsurgical distortion, or suspicious microcalcification is identified in either breast to suggest malignancy. Mammographic images were processed with  CAD. IMPRESSION: No evidence of malignancy in either breast. Lumpectomy changes bilaterally. RECOMMENDATION: Diagnostic mammogram is suggested in 1 year. (Code:DM-B-01Y)  CT CHEST, ABDOMEN PELVIS W CONTRAST 10/24/16 IMPRESSION: 1. The numerous tiny bilateral pulmonary nodules seen on the previous imaging exams are no longer evident, suggesting response of therapy. 2. Large ill-defined irregular right hepatic lesion measures smaller on today's study. 3. Slight increase in size of hepatoduodenal ligament lymphadenopathy. 4. Abdominal Aortic Atherosclerois (ICD10-170.0)  CT CAP 01/05/17 IMPRESSION: 1. Left lower lobe pulmonary nodule and hepatic metastatic disease are stable. 2. Hepatoduodenal ligament lymph node is stable. 3. Aortic atherosclerosis (ICD10-170.0). Coronary artery calcification. 4. Enlarged pulmonary arteries, indicative of pulmonary arterial hypertension. 5. Small bilateral adrenal nodules are unchanged.  CT CAP 05/09/17 IMPRESSION: 1. Stable appearance left lower lobe pulmonary nodule with an apparent new tiny posterior right upper lobe pulmonary nodule. Continued attention on follow-up recommended. 2. Dominant ill-defined right hepatic mass measures smaller on today's study. 3. Slight decrease in the hepatoduodenal ligament lymph node. 4.  Aortic Atherosclerois (ICD10-170.0)   CT CAP W Contrast 08/11/17 IMPRESSION: 1. Essentially stable appearance of small pulmonary nodules, hypodense mass laterally in the right hepatic lobe, and mild portacaval adenopathy. No new or progressive lesions. 2. Other imaging findings of potential clinical significance: Aortic Atherosclerosis (ICD10-I70.0). Lower lumbar  impingement due to spondylosis and degenerative disc disease. Uterine fibroids. Bilateral renal cysts. Coronary atherosclerosis. Mild mitral valve calcification. Mild cardiomegaly.    ASSESSMENT: Veronica Reyes 61 y.o. female   1. Colon Cancer, Stage  I (pT2N0), MMR normal, oligo recurrent disease in liver in 12/2014, KRAS mutation (+), liver and lung recurrence in 06/2016 -I previously reviewed the nature history of metastatic colon cancer, and the potential curative approach with chemotherapy followed by liver lesion ablation or resection. However, more likely, this is not curable disease. -She initially received 3 cycles of Capox, does not want to proceed with fourth cycle due to the moderate side effects from previous chemotherapy treatment. She declined liver mass resection, and underwent liver ablation by interventional radiologist Dr. Earleen Newport in 08/2015. - I previously reviewed her restaging CT scan from 06/17/2016, which unfortunately showed new area of infiltrative disease as the previous ablated liver lesion, likely recurrence. She also developed several new pulmonary nodules, small, concerning for metastatic disease. 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 --She has started chemotherapy FOLFIRI and Avastin, has been tolerating moderately well, we'll continue every 2 weeks. -Her CEA has significantly decreased since she started chemotherapy, likely responding to her good response to chemotherapy. -Repeat CT scan on 10/23/16 showed resolution of numerous bilateral pulmonary nodules, the right hepatic lesion is smaller, and slight decrease in size of hepatoduodenal ligament lymphadenopathy. No other new lesions  -I previously reviewed her restaging CT scan from 01/05/2017, which showed stable pulmonary and liver metastasis, no other new lesions. -she has been tolerating chemo poorly overall, I have decreased her chemo dose a few times. She has also frequently requested chemotherapy break due to multiple reasons. -Due to her worsening nausea, I'll add IV Emend before chemotherapy today, and I previously called in Phenergan 25 mg as needed for her nausea at home. She uses Compazine  also. She doesn't respond well to Zofran. -We reviewed her restaging CT scan from 05/09/2017, which showed continuous response to treatment, her liver metastasis has slightly decreased in size, no other new liver lesions. She still has a few small pulmonary nodules, we'll continue monitoring. -Patient would like to switch to maintenance therapy if possible, will decide after her next restaging scan. Will consider Xeloda in place of 5-Fu after next scan.  -We discussed her CT CAP from 08/11/17 which shows stable controlled disease.  -Will continue chemo break that started after 07/31/17 until the New year, per pt's request  -Due to the side effects from chemo, patient wants to switch to low intensity maintenance chemotherapy.  She is now very compliant with treatment, had multiple treatment interruption in the past year.  -I discussed her starting maintenance Xeloda along with Avastin every 3 weeks. I reviewed side effects with her. She agrees to proceed. Plan to start 09/17/16 after  Avastin infusion. -I will consult IR to discuss possible liver targeted therapy.  She had liver ablation in 2016.  Given her probable other metastasis in lungs and portacaval node, she may not be a candidate for further liver targeted therapy. -Labs reviewed, and overall normal. -F/u 09/17/16 with Avastin    2. Right breast cancer, pT1bN0M0, stage Ia, ER+/PR+/HER2-, History of left Breast Cancer, diagnosed on 02/01/2015 --Left CIS in situ, s/p lumpectomy in 2006 and 2011 by Dr. Margot Chimes, and radiation in 2011 by Dr. Sondra Come.  -I previously reviewed her surgical pathology results from 11/10/2015, which showed a 1 cm grade 2 invasive ductal carcinoma  and DCIS. Surgical margins were negative. -I previously reviewed her Oncotype DX result. Her recurrence score is 22, which predicts 14% 10 year risk of distant recurrence with tamoxifen alone. This is intermediate risk, based on her relatively low number, I do not recommend adjuvant  chemotherapy. -She has completed adjuvant breast radiation -her [place] level supports she is postmenopausal -She has started adjuvant letrozole in 05/2016, tolerating well, we'll continue. -We previously discussed breast cancer surveillance, I strongly encouraged her to continue annual screening mammogram, self exam, and follow-up with [place] routinely. -her 10/2016 mammogram was normal, currently there is no clinical concern for recurrence.  -Repeat mammogram in 10/2017   3. History of PE (01/06/2014) --She has a diagnosis of pulmonary embolism, likely secondary to malignancy in the setting of family history for stroke (and possible stroke history in patient as well). Lower extremity dopplers negative.  -continue Xarelto. Due to her metastatic colon cancer, I previously recommended her to continue indefinitely.  4.  Uterine Fibroids  --Negative endometrial biopsy. Following with Gynecology.  5. Kidney lesion (Right). -No hypermetabolic right kidney lesion on the pet scan -Repeat abdominal MRI 05/29/2015 showed unchanged right renal lesion, favored to represent a complex/septate cyst.  6. Right flank pain, Knee discomfort - possible related to her liver metastasis -improved with chemo -Continue Tylenol No. 4 as needed for pain. Her abdominal pain overall has improved lately. -Controled with tylenol #4, refilled today (07/30/17) -I suggest she see a orthopedist for her knee issues.   7.  Morbid obesity - I have previously encouraged her to eat healthy and exercise more.  8. HTN  -She will continue medication and following up with her PCP -We discussed that Avastin can increase her blood pressure, I recommend her to continue monitoring at home -She was mildly hypertensive on 07/16/17, we will  increase her dose of Amlodipine to 17m if needed in the future.    Plan  -Refill Tylenol #4 and amlodipine 54m -Continue chemo break until the New year -Prescribe Xeloda to start in January, in  addition to Avastin    All questions were answered. The patient knows to call the clinic with any problems, questions or concerns. We can certainly see the patient much sooner if necessary.  I spent 20 minutes counseling the patient face to face. The total time spent in the appointment was 25 minutes.   This document serves as a record of services personally performed by YaTruitt MerleMD. It was created on her behalf by AmJoslyn Devona trained medical scribe. The creation of this record is based on the scribe's personal observations and the provider's statements to them.    I have reviewed the above documentation for accuracy and completeness, and I agree with the above.   FeTruitt Merle11/29/2018

## 2017-08-13 ENCOUNTER — Ambulatory Visit: Payer: BLUE CROSS/BLUE SHIELD

## 2017-08-13 ENCOUNTER — Ambulatory Visit (HOSPITAL_BASED_OUTPATIENT_CLINIC_OR_DEPARTMENT_OTHER): Payer: BLUE CROSS/BLUE SHIELD | Admitting: Hematology

## 2017-08-13 ENCOUNTER — Other Ambulatory Visit (HOSPITAL_BASED_OUTPATIENT_CLINIC_OR_DEPARTMENT_OTHER): Payer: BLUE CROSS/BLUE SHIELD

## 2017-08-13 VITALS — BP 153/82 | HR 77 | Temp 98.7°F | Resp 18 | Ht 66.0 in | Wt 280.1 lb

## 2017-08-13 DIAGNOSIS — C50511 Malignant neoplasm of lower-outer quadrant of right female breast: Secondary | ICD-10-CM

## 2017-08-13 DIAGNOSIS — C187 Malignant neoplasm of sigmoid colon: Secondary | ICD-10-CM | POA: Diagnosis not present

## 2017-08-13 DIAGNOSIS — Z95828 Presence of other vascular implants and grafts: Secondary | ICD-10-CM

## 2017-08-13 DIAGNOSIS — C787 Secondary malignant neoplasm of liver and intrahepatic bile duct: Secondary | ICD-10-CM

## 2017-08-13 DIAGNOSIS — M25569 Pain in unspecified knee: Secondary | ICD-10-CM | POA: Diagnosis not present

## 2017-08-13 DIAGNOSIS — I10 Essential (primary) hypertension: Secondary | ICD-10-CM | POA: Diagnosis not present

## 2017-08-13 DIAGNOSIS — Z86718 Personal history of other venous thrombosis and embolism: Secondary | ICD-10-CM

## 2017-08-13 DIAGNOSIS — Z853 Personal history of malignant neoplasm of breast: Secondary | ICD-10-CM

## 2017-08-13 DIAGNOSIS — Z170 Estrogen receptor positive status [ER+]: Secondary | ICD-10-CM

## 2017-08-13 LAB — CBC WITH DIFFERENTIAL/PLATELET
BASO%: 1 % (ref 0.0–2.0)
BASOS ABS: 0.1 10*3/uL (ref 0.0–0.1)
EOS ABS: 0.1 10*3/uL (ref 0.0–0.5)
EOS%: 0.7 % (ref 0.0–7.0)
HEMATOCRIT: 38 % (ref 34.8–46.6)
HGB: 12.5 g/dL (ref 11.6–15.9)
LYMPH#: 2 10*3/uL (ref 0.9–3.3)
LYMPH%: 26.1 % (ref 14.0–49.7)
MCH: 30.8 pg (ref 25.1–34.0)
MCHC: 32.9 g/dL (ref 31.5–36.0)
MCV: 93.6 fL (ref 79.5–101.0)
MONO#: 0.4 10*3/uL (ref 0.1–0.9)
MONO%: 5.3 % (ref 0.0–14.0)
NEUT#: 5.2 10*3/uL (ref 1.5–6.5)
NEUT%: 66.9 % (ref 38.4–76.8)
PLATELETS: 263 10*3/uL (ref 145–400)
RBC: 4.07 10*6/uL (ref 3.70–5.45)
RDW: 17.6 % — ABNORMAL HIGH (ref 11.2–14.5)
WBC: 7.8 10*3/uL (ref 3.9–10.3)

## 2017-08-13 LAB — COMPREHENSIVE METABOLIC PANEL
ALT: 34 U/L (ref 0–55)
AST: 35 U/L — AB (ref 5–34)
Albumin: 3.1 g/dL — ABNORMAL LOW (ref 3.5–5.0)
Alkaline Phosphatase: 43 U/L (ref 40–150)
Anion Gap: 9 mEq/L (ref 3–11)
BUN: 7.8 mg/dL (ref 7.0–26.0)
CHLORIDE: 108 meq/L (ref 98–109)
CO2: 24 meq/L (ref 22–29)
Calcium: 9.4 mg/dL (ref 8.4–10.4)
Creatinine: 0.8 mg/dL (ref 0.6–1.1)
GLUCOSE: 103 mg/dL (ref 70–140)
POTASSIUM: 3.7 meq/L (ref 3.5–5.1)
SODIUM: 141 meq/L (ref 136–145)
Total Bilirubin: 0.32 mg/dL (ref 0.20–1.20)
Total Protein: 7.3 g/dL (ref 6.4–8.3)

## 2017-08-13 MED ORDER — SODIUM CHLORIDE 0.9% FLUSH
10.0000 mL | INTRAVENOUS | Status: DC | PRN
  Administered 2017-08-13: 10 mL via INTRAVENOUS
  Filled 2017-08-13: qty 10

## 2017-08-13 MED ORDER — AMLODIPINE BESYLATE 5 MG PO TABS: 5.0000 mg | ORAL_TABLET | Freq: Every day | ORAL | 3 refills | Status: DC

## 2017-08-13 MED ORDER — ACETAMINOPHEN-CODEINE #4 300-60 MG PO TABS: 1.0000 | ORAL_TABLET | Freq: Three times a day (TID) | ORAL | 0 refills | Status: DC | PRN

## 2017-08-15 ENCOUNTER — Telehealth: Payer: Self-pay | Admitting: Hematology

## 2017-08-15 NOTE — Telephone Encounter (Signed)
Scheduled appt per 11/29 los - sent reminder letter in the mail - lab/ MD/ avastin 09/17/2017.

## 2017-08-16 ENCOUNTER — Encounter: Payer: Self-pay | Admitting: Hematology

## 2017-08-16 MED ORDER — CAPECITABINE 500 MG PO TABS: 1000.0000 mg/m2 | ORAL_TABLET | Freq: Two times a day (BID) | ORAL | 0 refills | Status: DC

## 2017-08-17 ENCOUNTER — Telehealth: Payer: Self-pay | Admitting: Pharmacist

## 2017-08-17 DIAGNOSIS — C187 Malignant neoplasm of sigmoid colon: Secondary | ICD-10-CM

## 2017-08-17 MED ORDER — CAPECITABINE 500 MG PO TABS: 1000.0000 mg/m2 | ORAL_TABLET | Freq: Two times a day (BID) | ORAL | 0 refills | Status: DC

## 2017-08-17 NOTE — Telephone Encounter (Signed)
Oral Oncology Pharmacist Encounter  Received new prescription for Xeloda (capecitabine) for the treatment of metastatic, Kras mutation positive colon cancer in conjunction with Avastin, planned duration until disease progression or unacceptable toxicity. Noted patient previously on Xeloda (same dose of 2567m BID days 1-14 every 21 days) with oxaliplatin in late 2016, tolerated fairly well. Weight is stable 2016.  [facility]  Current medication list in Epic reviewed, no DDIs with Xeloda identified.  Prescription has been [place]-scribed to [facility]ph: [phone] for benefits analysis and approval as [facility] not in patient's prescription network.  Oral Oncology Clinic will continue to follow for insurance authorization, copayment issues, initial counseling and start date.  JJohny Drilling PharmD, BCPS, BCOP 08/17/2017 2:25 PM Oral Oncology Clinic [phone]

## 2017-08-19 NOTE — Telephone Encounter (Signed)
Oral Chemotherapy Pharmacist Encounter  Late entry  I spoke with patient on 08/17/17 for overview of: Xeloda (capecitabine).   Pt is doing well. Counseled patient on administration, dosing, side effects, monitoring, drug-food interactions, safe handling, storage, and disposal.  Patient will take Xeloda 500mg  tablets, 5 tablets (2500mg ) by mouth in AM and 5 tabs (2500mg ) by mouth in PM, within 30 minutes of finishing meals, on days 1-14 of each 21 day cycle.  Patient understands this is the same dosing scheme she was on in 2016 and her weight is the same as well. Avastin will be infused on day 1 of each 21 day cycle. Xeloda planned start date: 09/17/17  Side effects of Xeloda include but not limited to: fatigue, decreased blood counts, GI upset, diarrhea, and hand-foot syndrome. Patient has loperamide at home and will call the office if diarrhea develops.   Patient does not recall how she tolerated her Xeloda previously, however, patient will not be taking oxaliplatin with Xeloda this time, increased toleration is expected.  Reviewed with patient importance of keeping a medication schedule and plan for any missed doses.  Veronica Reyes voiced understanding and appreciation.   All questions answered. Medication reconciliation performed and medication/allergy list updated.  Patient updated that prescription has been sent to [facility]. Patient provided phone number to dispensing pharmacy and Oral oncology Clinic. She requested the pharmacy not contact her to schedule shipment of her Xeloda until 08/31/17. This was communicated with [facility].   Patient knows to call the office with questions or concerns. Oral Oncology Clinic will continue to follow.  Thank you,  Veronica Reyes, PharmD, BCPS, BCOP 08/19/2017   8:56 AM Oral Oncology Clinic [phone]

## 2017-08-21 ENCOUNTER — Telehealth: Payer: Self-pay | Admitting: Pharmacy Technician

## 2017-08-21 NOTE — Telephone Encounter (Signed)
Oral Oncology Patient Advocate Encounter  Received notification from [facility] that prior authorization for Xeloda is required.  PA submitted on CoverMyMeds Key [id number] Status is pending  Oral Oncology Clinic will continue to follow.  Veronica Reyes. [name], [place] Patient [place] [phone] 08/21/2017 10:46 AM

## 2017-08-25 NOTE — Telephone Encounter (Signed)
Oral Oncology Patient Advocate Encounter  Prior Authorization for Xeloda has been approved.    PA# [id number] Effective dates: 08/21/2017 through 08/20/2018  I have informed [facility] of the approval and sent them a copy of the approval letter.  They will continue to process the patient's prescription.   Oral Oncology Clinic will continue to follow.   [name]. [name], [place] Patient [place] [phone] 08/25/2017 10:23 AM

## 2017-09-03 NOTE — Telephone Encounter (Signed)
Oral Oncology Patient Advocate Encounter  Received confirmation from [name] that the patient's capecitabine will ship to the patient's home with expected delivery on 09/09/2017.   Veronica Reyes. [name], [name] Patient [name] [phone] 09/03/2017 9:56 AM

## 2017-09-10 ENCOUNTER — Other Ambulatory Visit: Payer: Self-pay | Admitting: Hematology

## 2017-09-10 DIAGNOSIS — C187 Malignant neoplasm of sigmoid colon: Secondary | ICD-10-CM

## 2017-09-17 ENCOUNTER — Other Ambulatory Visit: Payer: BLUE CROSS/BLUE SHIELD

## 2017-09-17 ENCOUNTER — Ambulatory Visit: Payer: BLUE CROSS/BLUE SHIELD | Admitting: Hematology

## 2017-09-17 ENCOUNTER — Other Ambulatory Visit: Payer: Self-pay | Admitting: *Deleted

## 2017-09-17 ENCOUNTER — Ambulatory Visit: Payer: BLUE CROSS/BLUE SHIELD

## 2017-09-17 ENCOUNTER — Telehealth: Payer: Self-pay | Admitting: Hematology

## 2017-09-17 DIAGNOSIS — C187 Malignant neoplasm of sigmoid colon: Secondary | ICD-10-CM

## 2017-09-17 MED ORDER — POTASSIUM CHLORIDE CRYS ER 20 MEQ PO TBCR: 20.0000 meq | EXTENDED_RELEASE_TABLET | Freq: Two times a day (BID) | ORAL | 2 refills | Status: DC

## 2017-09-17 NOTE — Telephone Encounter (Signed)
R/s appt per Patient request - Patient aware of the new appt time and date and okay per YF

## 2017-09-24 ENCOUNTER — Inpatient Hospital Stay (HOSPITAL_BASED_OUTPATIENT_CLINIC_OR_DEPARTMENT_OTHER): Payer: BLUE CROSS/BLUE SHIELD | Admitting: Oncology

## 2017-09-24 ENCOUNTER — Inpatient Hospital Stay: Payer: BLUE CROSS/BLUE SHIELD | Attending: Hematology

## 2017-09-24 ENCOUNTER — Inpatient Hospital Stay: Payer: BLUE CROSS/BLUE SHIELD

## 2017-09-24 VITALS — BP 152/96 | HR 78 | Temp 98.6°F | Resp 18 | Ht 66.0 in | Wt 280.0 lb

## 2017-09-24 VITALS — BP 159/81 | HR 67 | Resp 18

## 2017-09-24 DIAGNOSIS — Z170 Estrogen receptor positive status [ER+]: Secondary | ICD-10-CM | POA: Insufficient documentation

## 2017-09-24 DIAGNOSIS — C189 Malignant neoplasm of colon, unspecified: Secondary | ICD-10-CM | POA: Diagnosis not present

## 2017-09-24 DIAGNOSIS — Z86711 Personal history of pulmonary embolism: Secondary | ICD-10-CM | POA: Diagnosis not present

## 2017-09-24 DIAGNOSIS — I10 Essential (primary) hypertension: Secondary | ICD-10-CM | POA: Insufficient documentation

## 2017-09-24 DIAGNOSIS — C187 Malignant neoplasm of sigmoid colon: Secondary | ICD-10-CM

## 2017-09-24 DIAGNOSIS — C50511 Malignant neoplasm of lower-outer quadrant of right female breast: Secondary | ICD-10-CM | POA: Insufficient documentation

## 2017-09-24 DIAGNOSIS — C7800 Secondary malignant neoplasm of unspecified lung: Secondary | ICD-10-CM

## 2017-09-24 DIAGNOSIS — Z5112 Encounter for antineoplastic immunotherapy: Secondary | ICD-10-CM | POA: Diagnosis present

## 2017-09-24 DIAGNOSIS — C787 Secondary malignant neoplasm of liver and intrahepatic bile duct: Secondary | ICD-10-CM | POA: Insufficient documentation

## 2017-09-24 DIAGNOSIS — R5383 Other fatigue: Secondary | ICD-10-CM | POA: Insufficient documentation

## 2017-09-24 DIAGNOSIS — Z853 Personal history of malignant neoplasm of breast: Secondary | ICD-10-CM | POA: Insufficient documentation

## 2017-09-24 DIAGNOSIS — R109 Unspecified abdominal pain: Secondary | ICD-10-CM | POA: Diagnosis not present

## 2017-09-24 DIAGNOSIS — C50911 Malignant neoplasm of unspecified site of right female breast: Secondary | ICD-10-CM

## 2017-09-24 DIAGNOSIS — Z95828 Presence of other vascular implants and grafts: Secondary | ICD-10-CM

## 2017-09-24 LAB — CBC WITH DIFFERENTIAL/PLATELET
BASOS PCT: 2 %
Basophils Absolute: 0.2 10*3/uL — ABNORMAL HIGH (ref 0.0–0.1)
EOS PCT: 2 %
Eosinophils Absolute: 0.1 10*3/uL (ref 0.0–0.5)
HEMATOCRIT: 40.7 % (ref 34.8–46.6)
Hemoglobin: 13.5 g/dL (ref 11.6–15.9)
Lymphocytes Relative: 30 %
Lymphs Abs: 2.5 10*3/uL (ref 0.9–3.3)
MCH: 30.5 pg (ref 25.1–34.0)
MCHC: 33.1 g/dL (ref 31.5–36.0)
MCV: 92.1 fL (ref 79.5–101.0)
MONO ABS: 0.6 10*3/uL (ref 0.1–0.9)
MONOS PCT: 8 %
NEUTROS ABS: 4.9 10*3/uL (ref 1.5–6.5)
Neutrophils Relative %: 58 %
PLATELETS: 246 10*3/uL (ref 145–400)
RBC: 4.42 MIL/uL (ref 3.70–5.45)
RDW: 16.5 % — ABNORMAL HIGH (ref 11.2–16.1)
WBC: 8.4 10*3/uL (ref 3.9–10.3)

## 2017-09-24 LAB — COMPREHENSIVE METABOLIC PANEL
ALBUMIN: 3.1 g/dL — AB (ref 3.5–5.0)
ALK PHOS: 87 U/L (ref 40–150)
ALT: 47 U/L (ref 0–55)
ANION GAP: 10 (ref 3–11)
AST: 45 U/L — AB (ref 5–34)
BILIRUBIN TOTAL: 0.4 mg/dL (ref 0.2–1.2)
BUN: 11 mg/dL (ref 7–26)
CALCIUM: 9.9 mg/dL (ref 8.4–10.4)
CO2: 23 mmol/L (ref 22–29)
Chloride: 108 mmol/L (ref 98–109)
Creatinine, Ser: 0.84 mg/dL (ref 0.60–1.10)
GFR calc Af Amer: >60 mL/min (ref >60)
GFR calc non Af Amer: >60 mL/min (ref >60)
GLUCOSE: 99 mg/dL (ref 70–140)
Potassium: 3.6 mmol/L (ref 3.3–4.7)
SODIUM: 141 mmol/L (ref 136–145)
Total Protein: 8 g/dL (ref 6.4–8.3)

## 2017-09-24 LAB — UA PROTEIN, DIPSTICK - CHCC: Protein, ur: NEGATIVE mg/dL

## 2017-09-24 LAB — CEA (IN HOUSE-CHCC): CEA (CHCC-In House): 5.41 ng/mL — ABNORMAL HIGH (ref 0.00–5.00)

## 2017-09-24 MED ORDER — HEPARIN SOD (PORK) LOCK FLUSH 100 UNIT/ML IV SOLN
500.0000 [IU] | Freq: Once | INTRAVENOUS | Status: DC | PRN
  Filled 2017-09-24: qty 5

## 2017-09-24 MED ORDER — ALTEPLASE 2 MG IJ SOLR
2.0000 mg | Freq: Once | INTRAMUSCULAR | Status: DC | PRN
  Filled 2017-09-24: qty 2

## 2017-09-24 MED ORDER — SODIUM CHLORIDE 0.9% FLUSH
10.0000 mL | INTRAVENOUS | Status: DC | PRN
  Filled 2017-09-24: qty 10

## 2017-09-24 MED ORDER — SODIUM CHLORIDE 0.9% FLUSH
10.0000 mL | INTRAVENOUS | Status: DC | PRN
  Administered 2017-09-24: 10 mL
  Filled 2017-09-24: qty 10

## 2017-09-24 MED ORDER — HEPARIN SOD (PORK) LOCK FLUSH 100 UNIT/ML IV SOLN
500.0000 [IU] | Freq: Once | INTRAVENOUS | Status: AC | PRN
  Administered 2017-09-24: 500 [IU]
  Filled 2017-09-24: qty 5

## 2017-09-24 MED ORDER — SODIUM CHLORIDE 0.9 % IV SOLN
Freq: Once | INTRAVENOUS | Status: AC
  Administered 2017-09-24: 10:00:00 via INTRAVENOUS

## 2017-09-24 MED ORDER — SODIUM CHLORIDE 0.9 % IV SOLN
7.7000 mg/kg | Freq: Once | INTRAVENOUS | Status: AC
  Administered 2017-09-24: 1000 mg via INTRAVENOUS
  Filled 2017-09-24: qty 32

## 2017-09-24 MED ORDER — ACETAMINOPHEN-CODEINE #4 300-60 MG PO TABS: 1.0000 | ORAL_TABLET | Freq: Three times a day (TID) | ORAL | 0 refills | Status: DC | PRN

## 2017-09-24 NOTE — Progress Notes (Signed)
[facility] OFFICE PROGRESS NOTE  [name], [name], MD [address] [place] [address]  DIAGNOSIS:  1) recurrent Malignant neoplasm of lower-outer quadrant of right breast of female, estrogen receptor positive ([place]) 2) Colon Cancer, Stage I (pT2N0), MMR normal, oligo recurrent disease in liver in 12/2014, KRAS mutation (+), liver and lung recurrence in 06/2016  PRIOR THERAPY: 1) status post lumpectomy in 2016 and 2011; radiation in 2011   2) CAPEOX 03/05/2015 through 04/16/2015 3) Chemotherapy FOLFIRI and Avastin (from cycle 2) and Leucovorin (added from cycle 8) every 2 weeks, started on 07/31/2016, multiple delay per pt's request; chemo break from 5/30-6/21/18; decreased leucovorin to 2000 mg/m2, irinotecan to 130m/m2, 5-fu 20037mm2 from 03/05/17, due to poor tolerance. Started chemo break since 07/31/17.  CURRENT THERAPY: 1) Letrozole 2.5 mg once daily, started in September 2017 2) Xeloda 2500 mg twice daily days 1 through 14 of each cycle of treatment and Avastin given every 3 weeks.  First dose 09/24/2017.  INTERVAL HISTORY: Veronica DROZDOWSKI64.o. female returns for routine follow-up visit by herself.  The patient has been off chemotherapy since November 2018.  She was able to enjoy her holidays and other than mild fatigue, she has no complaints today.  The patient denies fevers and chills.  Denies chest pain, shortness of breath, cough, hemoptysis.  Denies nausea, vomiting, constipation, diarrhea.  The patient uses Tylenol No. 4 about twice a day for her back pain.  She requests a refill of this today.  She is on several medications for her blood pressure, but has not yet taken them this morning.  The patient reports that she does not yet have her Xeloda in hand and that it was delivered to a friend's house.  She will not see this friend until this Sunday in order to get the medication.  The patient is here for evaluation and repeat blood work and  to start treatment with Xeloda and Avastin.  MEDICAL HISTORY: Past Medical History:  Diagnosis Date  . Anxiety   . Breast CA ([facility]   radiation and surgery-lt.  . Cancer ([facility]   left breast cancer x2  . Hypertension   . Liver lesion   . [place]ism ([facility]   "blood clot in lungs" -dx. 4'15 with CT Chest. On Xarelto  . Radiation 01/22/10-03/12/10   left whole breast 4500 cGy, upper aspect boosted to 6120 cGy  . Radiation 02/21/16 - 04/10/16   right breast 50.4 Gy, right breast boost 12 Gy  . Shortness of breath    sob with exertion. dx. with Pulmonary emboli 4'15 ,tx. Xarelto,Lovenox used for [employer]   ALLERGIES:  is allergic to tramadol.  MEDICATIONS:  Current Outpatient Medications  Medication Sig Dispense Refill  . acetaminophen-codeine (TYLENOL #4) 300-60 MG tablet Take 1 tablet by mouth every 8 (eight) hours as needed. for pain 70 tablet 0  . amLODipine (NORVASC) 5 MG tablet Take 1 tablet (5 mg total) by mouth daily. 30 tablet 3  . capecitabine (XELODA) 500 MG tablet Take 5 tablets (2,500 mg total) by mouth 2 (two) times daily after a meal. Take for 14 days on, 7 days off, every 21 days. 140 tablet 0  . dexamethasone (DECADRON) 4 MG tablet Take 1 tablet (4 mg total) by mouth daily. 20 tablet 1  . ferrous sulfate 325 (65 FE) MG tablet Take 325 mg by mouth daily with breakfast. Pt takes  4 times per week.    .Marland Kitchen  hydrochlorothiazide (HYDRODIURIL) 25 MG tablet Take 1 tablet (25 mg total) by mouth every morning. 30 tablet 0  . letrozole (FEMARA) 2.5 MG tablet TAKE 1 TABLET BY MOUTH EVERY DAY 30 tablet 3  . lidocaine-prilocaine (EMLA) cream Apply 1 application topically as needed. 30 g 2  . LORazepam (ATIVAN) 0.5 MG tablet Take 1 tablet (0.5 mg total) by mouth every 8 (eight) hours. 30 tablet 0  . magic mouthwash w/lidocaine SOLN Take 5 mLs by mouth 4 (four) times daily as needed for mouth pain. 240 mL 1  . metoprolol tartrate (LOPRESSOR) 50 MG tablet Take 1 tablet (50 mg total)  by mouth 2 (two) times daily. 60 tablet 0  . ondansetron (ZOFRAN) 8 MG tablet Take 1 tablet (8 mg total) by mouth 2 (two) times daily as needed for refractory nausea / vomiting. Start on day 3 after chemotherapy. 30 tablet 1  . potassium chloride SA (K-DUR,KLOR-CON) 20 MEQ tablet Take 1 tablet (20 mEq total) by mouth 2 (two) times daily. 60 tablet 2  . prochlorperazine (COMPAZINE) 10 MG tablet Take 1 tablet (10 mg total) by mouth every 6 (six) hours as needed (NAUSEA). 30 tablet 2  . promethazine (PHENERGAN) 25 MG tablet Take 1 tablet (25 mg total) by mouth every 6 (six) hours as needed for nausea or vomiting. 30 tablet 2  . XARELTO 20 MG TABS tablet TAKE 1 TABLET BY MOUTH EVERY DAY 30 tablet 3  . zolpidem (AMBIEN) 5 MG tablet Take 1 tablet (5 mg total) by mouth at bedtime as needed for sleep. 20 tablet 0   No current facility-administered medications for this visit.    Facility-Administered Medications Ordered in Other Visits  Medication Dose Route Frequency Provider Last Rate Last Dose  . 0.9 %  sodium chloride infusion   Intravenous Once [name], MD      . sodium chloride flush (NS) 0.9 % injection 10 mL  10 mL Intracatheter PRN [name], MD   10 mL at 09/24/17 1134    SURGICAL HISTORY:  Past Surgical History:  Procedure Laterality Date  . BREAST LUMPECTOMY WITH RADIOACTIVE SEED AND SENTINEL LYMPH NODE BIOPSY Right 11/07/2015   Procedure: BREAST LUMPECTOMY WITH RADIOACTIVE SEED AND SENTINEL LYMPH NODE BIOPSY;  Surgeon: Stark Klein, MD;  Location: [facility];  Service: General;  Laterality: Right;  . BREAST SURGERY     '11- Left breast lumpectomy  . CESAREAN SECTION    . CHOLECYSTECTOMY     Laparoscopic 20 yrs ago  . COLON SURGERY  03-09-14   partial colectomy for cancer  . IR GENERIC HISTORICAL  07/29/2016   IR FLUORO GUIDE PORT INSERTION RIGHT 07/29/2016 Sandi Mariscal, MD [facility]  . IR GENERIC HISTORICAL  07/29/2016   IR US GUIDE VASC ACCESS RIGHT 07/29/2016  Sandi Mariscal, MD [facility]  . IR GENERIC HISTORICAL  07/29/2016   IR US GUIDE BX ASP/DRAIN 07/29/2016 [facility]  . PARTIAL COLECTOMY N/A 03/09/2014   Procedure: LAPAROSCOPIC ASSISTED PARTIAL COLECTOMY, splenic flexure mobilization;  Surgeon: Leighton Ruff, MD;  Location[facility];  Service: General;  Laterality: N/A;  . RADIOFREQUENCY ABLATION Right 08/24/2015   Procedure: MICROWAVE ABLATION RIGHT LIVER LOBE ;  Surgeon: Corrie Mckusick, DO;  Location: [facility];  Service: Anesthesiology;  Laterality: Right;    REVIEW OF SYSTEMS:   Review of Systems  Constitutional: Negative for appetite change, chills, fever and unexpected weight change. Positive for mild fatigue. HENT:   Negative for mouth sores, nosebleeds, sore throat  and trouble swallowing.   Eyes: Negative for eye problems and icterus.  Respiratory: Negative for cough, hemoptysis, shortness of breath and wheezing.   Cardiovascular: Negative for chest pain and leg swelling.  Gastrointestinal: Negative for abdominal pain, constipation, diarrhea, nausea and vomiting.  Genitourinary: Negative for bladder incontinence, difficulty urinating, dysuria, frequency and hematuria.   Musculoskeletal: Negative for back pain, gait problem, neck pain and neck stiffness.  Skin: Negative for itching and rash.  Neurological: Negative for dizziness, extremity weakness, gait problem, headaches, light-headedness and seizures.  Hematological: Negative for adenopathy. Does not bruise/bleed easily.  Psychiatric/Behavioral: Negative for confusion, depression and sleep disturbance. The patient is not nervous/anxious.     PHYSICAL EXAMINATION:  Blood pressure (!) 152/96, pulse 78, temperature 98.6 F (37 C), temperature source Oral, resp. rate 18, height '5\' 6"'  (1.676 m), weight 280 lb (127 kg), last menstrual period 01/23/2014, SpO2 100 %.  ECOG PERFORMANCE STATUS: 1 - Symptomatic but completely ambulatory  Physical Exam  Constitutional: Oriented to person,  place, and time and well-developed, well-nourished, and in no distress. No distress.  HENT:  Head: Normocephalic and atraumatic.  Mouth/Throat: Oropharynx is clear and moist. No oropharyngeal exudate.  Eyes: Conjunctivae are normal. Right eye exhibits no discharge. Left eye exhibits no discharge. No scleral icterus.  Neck: Normal range of motion. Neck supple.  Cardiovascular: Normal rate, regular rhythm, normal heart sounds and intact distal pulses.   Pulmonary/Chest: Effort normal and breath sounds normal. No respiratory distress. No wheezes. No rales.  Abdominal: Soft. Bowel sounds are normal. Exhibits no distension and no mass. There is no tenderness.  Musculoskeletal: Normal range of motion. Exhibits no edema.  Lymphadenopathy:    No cervical adenopathy.  Neurological: Alert and oriented to person, place, and time. Exhibits normal muscle tone. Gait normal. Coordination normal.  Skin: Skin is warm and dry. No rash noted. Not diaphoretic. No erythema. No pallor.  Psychiatric: Mood, memory and judgment normal.  Vitals reviewed.  LABORATORY DATA: Lab Results  Component Value Date   WBC 8.4 09/24/2017   HGB 13.5 09/24/2017   HCT 40.7 09/24/2017   MCV 92.1 09/24/2017   PLT 246 09/24/2017      Chemistry      Component Value Date/Time   NA 141 09/24/2017 0837   NA 141 08/13/2017 1025   K 3.6 09/24/2017 0837   K 3.7 08/13/2017 1025   CL 108 09/24/2017 0837   CO2 23 09/24/2017 0837   CO2 24 08/13/2017 1025   BUN 11 09/24/2017 0837   BUN 7.8 08/13/2017 1025   CREATININE 0.84 09/24/2017 0837   CREATININE 0.8 08/13/2017 1025      Component Value Date/Time   CALCIUM 9.9 09/24/2017 0837   CALCIUM 9.4 08/13/2017 1025   ALKPHOS 87 09/24/2017 0837   ALKPHOS 43 08/13/2017 1025   AST 45 (H) 09/24/2017 0837   AST 35 (H) 08/13/2017 1025   ALT 47 09/24/2017 0837   ALT 34 08/13/2017 1025   BILITOT 0.4 09/24/2017 0837   BILITOT 0.32 08/13/2017 1025       RADIOGRAPHIC  STUDIES:  No results found.   ASSESSMENT/PLAN:  Oda Kilts 63 y.o. female   1. Colon Cancer, Stage I (pT2N0), MMR normal, oligo recurrent disease in liver in 12/2014, KRAS mutation (+), liver and lung recurrence in 06/2016 Status post several lines of therapy including CAPEOX and full ferry Avastin.  She has been on a treatment break since before the holidays and is here to resume treatment with Xeloda  and Avastin every 3 weeks.  The Xeloda is 500 mg twice daily days 1 through 14 of each cycle and Avastin is 7.5 mg/kg every 3 weeks.  Labs reviewed and are adequate for treatment today.  Blood pressure is elevated and she took her blood pressure medications here in our office.  Blood pressure will be rechecked prior to her infusion.  She will proceed with cycle 1 of her chemotherapy today as scheduled.  She was instructed to begin the Xeloda this weekend when she receives it.  2. Right breast cancer, pT1bN0M0, stage Ia, ER+/PR+/HER2-, History of left Breast Cancer, diagnosed on 02/01/2015 Mammogram is due in February 2019.  This will be ordered at her next visit.  Continue letrozole.  3. History of PE (01/06/2014) She has a diagnosis of pulmonary embolism, likely secondary to malignancy in the setting of family history for stroke (and possible stroke history in patient as well). Lower extremity dopplers negative.  Continue Xarelto. Due to her metastatic colon cancer, I previously recommended her to continue indefinitely.  4.  Uterine Fibroids  Negative endometrial biopsy. Following with Gynecology.  5. Kidney lesion (Right). No hypermetabolic right kidney lesion on the pet scan Repeat abdominal MRI 05/29/2015 showed unchanged right renal lesion, favored to represent a complex/septate cyst.  6. Right flank pain, Knee discomfort possible related to her liver metastasis improved with chemo Continue Tylenol No. 4 as needed for pain. Her abdominal pain overall has improved  lately.  Tylenol No. 4 was refilled today.  7.  Morbid obesity Encouraged her to eat healthy and exercise more.  8. HTN  She will continue medication and following up with her PCP We discussed that Avastin can increase her blood pressure, I recommend her to continue monitoring at home She was hypertensive today and had not yet taken her blood pressure medications.  She took them here in our office and blood pressure will be rechecked in the infusion area.   The patient will have a return visit in 3 weeks for evaluation prior to cycle 2 of her treatment.   All questions were answered. The patient knows to call the clinic with any problems, questions or concerns. We can certainly see the patient much sooner if necessary.  Orders Placed This Encounter  Procedures  . CBC with Differential (Cancer Center Only)    Standing Status:   Future    Standing Expiration Date:   09/24/2018  . CMP ([facility] only)    Standing Status:   Future    Standing Expiration Date:   09/24/2018  . UA Protein, Dipstick - [facility]    Standing Status:   Future    Standing Expiration Date:   09/24/2018  . CEA (IN HOUSE-[facility])    Standing Status:   Future    Standing Expiration Date:   09/24/2018    Mikey Bussing, DNP, AGPCNP-BC, AOCNP 09/24/17

## 2017-09-24 NOTE — Patient Instructions (Signed)
[facility] Discharge Instructions for Patients Receiving Chemotherapy  Today you received the following chemotherapy agents Avastin.   To help prevent nausea and vomiting after your treatment, we encourage you to take your nausea medication as prescribed.    If you develop nausea and vomiting that is not controlled by your nausea medication, call the clinic.   BELOW ARE SYMPTOMS THAT SHOULD BE REPORTED IMMEDIATELY:  *FEVER GREATER THAN 100.5 F  *CHILLS WITH OR WITHOUT FEVER  NAUSEA AND VOMITING THAT IS NOT CONTROLLED WITH YOUR NAUSEA MEDICATION  *UNUSUAL SHORTNESS OF BREATH  *UNUSUAL BRUISING OR BLEEDING  TENDERNESS IN MOUTH AND THROAT WITH OR WITHOUT PRESENCE OF ULCERS  *URINARY PROBLEMS  *BOWEL PROBLEMS  UNUSUAL RASH Items with * indicate a potential emergency and should be followed up as soon as possible.  Feel free to call the clinic should you have any questions or concerns. The clinic phone number is [phone].  Please show the CHEMO ALERT CARD at check-in to the Emergency Department and triage nurse.   

## 2017-09-25 ENCOUNTER — Other Ambulatory Visit: Payer: Self-pay | Admitting: Hematology

## 2017-09-25 DIAGNOSIS — C187 Malignant neoplasm of sigmoid colon: Secondary | ICD-10-CM

## 2017-09-29 ENCOUNTER — Other Ambulatory Visit: Payer: Self-pay | Admitting: Hematology

## 2017-09-29 DIAGNOSIS — Z170 Estrogen receptor positive status [ER+]: Principal | ICD-10-CM

## 2017-09-29 DIAGNOSIS — C50511 Malignant neoplasm of lower-outer quadrant of right female breast: Secondary | ICD-10-CM

## 2017-10-05 ENCOUNTER — Other Ambulatory Visit: Payer: Self-pay | Admitting: Hematology

## 2017-10-05 DIAGNOSIS — C187 Malignant neoplasm of sigmoid colon: Secondary | ICD-10-CM

## 2017-10-07 ENCOUNTER — Other Ambulatory Visit: Payer: Self-pay | Admitting: *Deleted

## 2017-10-07 ENCOUNTER — Telehealth: Payer: Self-pay | Admitting: *Deleted

## 2017-10-07 DIAGNOSIS — C50511 Malignant neoplasm of lower-outer quadrant of right female breast: Secondary | ICD-10-CM

## 2017-10-07 DIAGNOSIS — Z170 Estrogen receptor positive status [ER+]: Principal | ICD-10-CM

## 2017-10-07 MED ORDER — LETROZOLE 2.5 MG PO TABS
2.5000 mg | ORAL_TABLET | Freq: Every day | ORAL | 3 refills | Status: DC
Start: 2017-10-07 — End: 2018-01-21

## 2017-10-07 MED ORDER — METOPROLOL TARTRATE 50 MG PO TABS: 50.0000 mg | ORAL_TABLET | Freq: Two times a day (BID) | ORAL | 0 refills | Status: DC

## 2017-10-07 MED ORDER — AMLODIPINE BESYLATE 5 MG PO TABS: 5.0000 mg | ORAL_TABLET | Freq: Every day | ORAL | 3 refills | Status: DC

## 2017-10-07 MED ORDER — RIVAROXABAN 20 MG PO TABS: 20.0000 mg | ORAL_TABLET | Freq: Every day | ORAL | 3 refills | Status: DC

## 2017-10-07 MED ORDER — HYDROCHLOROTHIAZIDE 25 MG PO TABS: 25.0000 mg | ORAL_TABLET | ORAL | 0 refills | Status: DC

## 2017-10-07 NOTE — Telephone Encounter (Signed)
TCT patient to discuss refills for her blood pressure meds.  Spoke with [employer] and she states she is between PCPs right now and needs to make an appt with new PCP. She has not done this yet-states she has challenges with transportation d/t her husband's job.   Discussed with Dr. Burr Medico. Dr. Burr Medico is ok to refill her BP meds while pt is working on getting in with PCP.  Refills done for Amlodipine, Metoproplol and Hydrodiuril. Pt has refills left on her potassium.

## 2017-10-15 ENCOUNTER — Other Ambulatory Visit: Payer: BLUE CROSS/BLUE SHIELD

## 2017-10-15 ENCOUNTER — Ambulatory Visit: Payer: BLUE CROSS/BLUE SHIELD | Admitting: Hematology

## 2017-10-15 ENCOUNTER — Ambulatory Visit: Payer: BLUE CROSS/BLUE SHIELD

## 2017-10-15 ENCOUNTER — Telehealth: Payer: Self-pay | Admitting: *Deleted

## 2017-10-15 ENCOUNTER — Telehealth: Payer: Self-pay | Admitting: Pharmacist

## 2017-10-15 NOTE — Telephone Encounter (Signed)
This  RN received VM from pt stating " I haven't received my chemo pills yet - the ones I have to take 5 pills twice a day "  Return call number given as [phone].  Per chart review pt under care with Dr Burr Medico. Noted prescription for xeloda with documentation by [facility] chemo pharmacy regarding this medication.  This note will be sent to her as well as Dr Burr Medico for communication with pt and further follow up.

## 2017-10-15 NOTE — Telephone Encounter (Signed)
Followed up with [facility] about the status of the patient's Xeloda.    They spoke with her on 10/13/2017 about the refill of the prescription.  Her copay for this fill is ~$350.  [facility] is currently working to try to find copayment assistance for the patient.  They will be back in touch with her shortly about the status of any available copayment assistance.    I have discussed this with the patient.  If this remains unaffordable for the patient, she will reach out to the office.   [name]. [name], [place] Patient [place] [phone] 10/15/2017 12:02 PM

## 2017-10-15 NOTE — Telephone Encounter (Signed)
Oral Oncology Pharmacist Encounter  Received call from patient with questions about whether to continue Avastin therapy if she is unable to afford her Xeloda.  Xeloda copayment $350 as patient has likely not met deductible for calendar year yet.   Patient counseled to continue Avastin.  [place] has spoken with patient and their assistance team is looking into copayment support. If [facility] is unable to provide copayment relief, patient will not be able to afford her Xeloda. Patient instructed to reach out to Dr. Burr Medico about treatment options in this case.  Patient stated that she did not want to go back on 5-FU pump. Patient informed this may be the best treatment option, further treatment discussions deferred to Dr. Burr Medico.  Patient rescheduled Avastin appointment for today (10/15/17).  Patient verbalized appreciation and understanding.  Oral Oncology Clinic will continue to follow.  Johny Drilling, PharmD, BCPS, BCOP 10/15/2017 10:29 AM Oral Oncology Clinic [phone]

## 2017-10-17 ENCOUNTER — Other Ambulatory Visit: Payer: Self-pay | Admitting: Hematology

## 2017-10-19 ENCOUNTER — Telehealth: Payer: Self-pay | Admitting: *Deleted

## 2017-10-19 NOTE — Telephone Encounter (Signed)
Pt called and left message requesting refill of Tylenol # 4.  Stated she has only 4 tablets left, and will not have enough until next office visit on 10/23/17. Pt's   Phone      [phone].

## 2017-10-20 ENCOUNTER — Other Ambulatory Visit: Payer: Self-pay | Admitting: Emergency Medicine

## 2017-10-20 DIAGNOSIS — C187 Malignant neoplasm of sigmoid colon: Secondary | ICD-10-CM

## 2017-10-20 MED ORDER — ACETAMINOPHEN-CODEINE #4 300-60 MG PO TABS: 1.0000 | ORAL_TABLET | Freq: Three times a day (TID) | ORAL | 0 refills | Status: DC | PRN

## 2017-10-23 ENCOUNTER — Inpatient Hospital Stay: Payer: BLUE CROSS/BLUE SHIELD

## 2017-10-23 ENCOUNTER — Inpatient Hospital Stay: Payer: BLUE CROSS/BLUE SHIELD | Attending: Hematology | Admitting: Nurse Practitioner

## 2017-10-23 ENCOUNTER — Encounter: Payer: Self-pay | Admitting: Nurse Practitioner

## 2017-10-23 ENCOUNTER — Other Ambulatory Visit: Payer: BLUE CROSS/BLUE SHIELD

## 2017-10-23 ENCOUNTER — Telehealth: Payer: Self-pay | Admitting: Pharmacy Technician

## 2017-10-23 VITALS — BP 152/87 | HR 63 | Temp 98.3°F | Resp 20 | Ht 66.0 in | Wt 280.4 lb

## 2017-10-23 VITALS — BP 169/95

## 2017-10-23 DIAGNOSIS — Z86711 Personal history of pulmonary embolism: Secondary | ICD-10-CM | POA: Diagnosis not present

## 2017-10-23 DIAGNOSIS — Z853 Personal history of malignant neoplasm of breast: Secondary | ICD-10-CM | POA: Insufficient documentation

## 2017-10-23 DIAGNOSIS — N289 Disorder of kidney and ureter, unspecified: Secondary | ICD-10-CM | POA: Diagnosis not present

## 2017-10-23 DIAGNOSIS — D259 Leiomyoma of uterus, unspecified: Secondary | ICD-10-CM | POA: Insufficient documentation

## 2017-10-23 DIAGNOSIS — Z5112 Encounter for antineoplastic immunotherapy: Secondary | ICD-10-CM | POA: Insufficient documentation

## 2017-10-23 DIAGNOSIS — E876 Hypokalemia: Secondary | ICD-10-CM | POA: Diagnosis not present

## 2017-10-23 DIAGNOSIS — Z95828 Presence of other vascular implants and grafts: Secondary | ICD-10-CM

## 2017-10-23 DIAGNOSIS — R5383 Other fatigue: Secondary | ICD-10-CM | POA: Diagnosis not present

## 2017-10-23 DIAGNOSIS — C787 Secondary malignant neoplasm of liver and intrahepatic bile duct: Secondary | ICD-10-CM | POA: Diagnosis not present

## 2017-10-23 DIAGNOSIS — C187 Malignant neoplasm of sigmoid colon: Secondary | ICD-10-CM

## 2017-10-23 DIAGNOSIS — R109 Unspecified abdominal pain: Secondary | ICD-10-CM

## 2017-10-23 DIAGNOSIS — I10 Essential (primary) hypertension: Secondary | ICD-10-CM | POA: Diagnosis not present

## 2017-10-23 DIAGNOSIS — C50511 Malignant neoplasm of lower-outer quadrant of right female breast: Secondary | ICD-10-CM

## 2017-10-23 DIAGNOSIS — C7800 Secondary malignant neoplasm of unspecified lung: Secondary | ICD-10-CM | POA: Diagnosis not present

## 2017-10-23 DIAGNOSIS — Z170 Estrogen receptor positive status [ER+]: Secondary | ICD-10-CM

## 2017-10-23 DIAGNOSIS — Z79811 Long term (current) use of aromatase inhibitors: Secondary | ICD-10-CM

## 2017-10-23 DIAGNOSIS — C50911 Malignant neoplasm of unspecified site of right female breast: Secondary | ICD-10-CM | POA: Diagnosis not present

## 2017-10-23 LAB — CBC WITH DIFFERENTIAL/PLATELET
BASOS ABS: 0 10*3/uL (ref 0.0–0.1)
Basophils Relative: 0 %
Eosinophils Absolute: 0.1 10*3/uL (ref 0.0–0.5)
Eosinophils Relative: 1 %
HCT: 40 % (ref 34.8–46.6)
HEMOGLOBIN: 13.1 g/dL (ref 11.6–15.9)
LYMPHS ABS: 2.6 10*3/uL (ref 0.9–3.3)
LYMPHS PCT: 31 %
MCH: 31.3 pg (ref 25.1–34.0)
MCHC: 32.8 g/dL (ref 31.5–36.0)
MCV: 95.7 fL (ref 79.5–101.0)
Monocytes Absolute: 0.6 10*3/uL (ref 0.1–0.9)
Monocytes Relative: 7 %
NEUTROS ABS: 5 10*3/uL (ref 1.5–6.5)
NEUTROS PCT: 61 %
Platelets: 216 10*3/uL (ref 145–400)
RBC: 4.18 MIL/uL (ref 3.70–5.45)
RDW: 18 % — ABNORMAL HIGH (ref 11.2–14.5)
WBC: 8.4 10*3/uL (ref 3.9–10.3)

## 2017-10-23 LAB — COMPREHENSIVE METABOLIC PANEL
ALK PHOS: 109 U/L (ref 40–150)
ALT: 50 U/L (ref 0–55)
AST: 54 U/L — AB (ref 5–34)
Albumin: 3.2 g/dL — ABNORMAL LOW (ref 3.5–5.0)
Anion gap: 10 (ref 3–11)
BUN: 10 mg/dL (ref 7–26)
CALCIUM: 9.6 mg/dL (ref 8.4–10.4)
CO2: 27 mmol/L (ref 22–29)
CREATININE: 0.79 mg/dL (ref 0.60–1.10)
Chloride: 103 mmol/L (ref 98–109)
Glucose, Bld: 99 mg/dL (ref 70–140)
Potassium: 3.2 mmol/L — ABNORMAL LOW (ref 3.5–5.1)
Sodium: 140 mmol/L (ref 136–145)
Total Bilirubin: 0.7 mg/dL (ref 0.2–1.2)
Total Protein: 8.1 g/dL (ref 6.4–8.3)

## 2017-10-23 LAB — UA PROTEIN, DIPSTICK - CHCC: Protein, ur: NEGATIVE mg/dL

## 2017-10-23 LAB — CEA (IN HOUSE-CHCC): CEA (CHCC-IN HOUSE): 8.03 ng/mL — AB (ref 0.00–5.00)

## 2017-10-23 MED ORDER — SODIUM CHLORIDE 0.9 % IV SOLN
7.7000 mg/kg | Freq: Once | INTRAVENOUS | Status: AC
  Administered 2017-10-23: 1000 mg via INTRAVENOUS
  Filled 2017-10-23: qty 32

## 2017-10-23 MED ORDER — SODIUM CHLORIDE 0.9% FLUSH
10.0000 mL | INTRAVENOUS | Status: DC | PRN
  Administered 2017-10-23: 10 mL
  Filled 2017-10-23: qty 10

## 2017-10-23 MED ORDER — CAPECITABINE 500 MG PO TABS: ORAL_TABLET | ORAL | 0 refills | Status: DC

## 2017-10-23 MED ORDER — SODIUM CHLORIDE 0.9% FLUSH
10.0000 mL | INTRAVENOUS | Status: DC | PRN
Start: 2017-10-23 — End: 2017-10-23
  Administered 2017-10-23: 10 mL via INTRAVENOUS
  Filled 2017-10-23: qty 10

## 2017-10-23 MED ORDER — SODIUM CHLORIDE 0.9 % IV SOLN
Freq: Once | INTRAVENOUS | Status: AC
  Administered 2017-10-23: 14:00:00 via INTRAVENOUS

## 2017-10-23 MED ORDER — POTASSIUM CHLORIDE CRYS ER 20 MEQ PO TBCR: 20.0000 meq | EXTENDED_RELEASE_TABLET | Freq: Two times a day (BID) | ORAL | 2 refills | Status: DC

## 2017-10-23 MED ORDER — LIDOCAINE-PRILOCAINE 2.5-2.5 % EX CREA: 1.0000 "application " | TOPICAL_CREAM | CUTANEOUS | 2 refills | Status: DC | PRN

## 2017-10-23 MED ORDER — HEPARIN SOD (PORK) LOCK FLUSH 100 UNIT/ML IV SOLN
500.0000 [IU] | Freq: Once | INTRAVENOUS | Status: AC | PRN
  Administered 2017-10-23: 500 [IU]
  Filled 2017-10-23: qty 5

## 2017-10-23 NOTE — Telephone Encounter (Signed)
Thank you for your assistance! I refilled it today after I saw her in clinic. Let me know if there's any trouble. Thanks, Regan Rakers NP

## 2017-10-23 NOTE — Patient Instructions (Signed)
[facility] Discharge Instructions for Patients Receiving Chemotherapy  Today you received the following chemotherapy agents Avastin.   To help prevent nausea and vomiting after your treatment, we encourage you to take your nausea medication as prescribed.    If you develop nausea and vomiting that is not controlled by your nausea medication, call the clinic.   BELOW ARE SYMPTOMS THAT SHOULD BE REPORTED IMMEDIATELY:  *FEVER GREATER THAN 100.5 F  *CHILLS WITH OR WITHOUT FEVER  NAUSEA AND VOMITING THAT IS NOT CONTROLLED WITH YOUR NAUSEA MEDICATION  *UNUSUAL SHORTNESS OF BREATH  *UNUSUAL BRUISING OR BLEEDING  TENDERNESS IN MOUTH AND THROAT WITH OR WITHOUT PRESENCE OF ULCERS  *URINARY PROBLEMS  *BOWEL PROBLEMS  UNUSUAL RASH Items with * indicate a potential emergency and should be followed up as soon as possible.  Feel free to call the clinic should you have any questions or concerns. The clinic phone number is [phone].  Please show the CHEMO ALERT CARD at check-in to the Emergency Department and triage nurse.   

## 2017-10-23 NOTE — Progress Notes (Signed)
[facility]  Telephone:(336) [phone] Fax:([phone]) [phone]  Clinic Follow up Note   Patient Care Team: [name], MD as PCP - General (Internal Medicine) Leighton Ruff, MD as Consulting Physician (General Surgery) Carol Ada, MD as Consulting Physician (Gastroenterology) Concha Norway, MD (Inactive) as Consulting Physician (Internal Medicine) Emily Filbert, MD as Consulting Physician (Obstetrics and Gynecology) Tania Ade, RN as Registered Nurse (Medical Oncology) Stark Klein, MD as Consulting Physician (General Surgery) [name], MD as Consulting Physician (Hematology) 10/23/2017  DIAGNOSIS: 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 Left breast DCIS diagnosed in 2006 and 2010, status post lumpectomy. 2. pT2N0M0 stage I colon cancer, s/p partial colectomy on 03/09/2014  3. PE in 12/2013 when she was diagnosed with colon cancer. 4. Right breast stage I breast cancer diagnosed in 01/2015  SUMMARY OF ONCOLOGIC HISTORY: Oncology History   Breast cancer of lower-outer quadrant of right female breast [facility])   Staging form: Breast, AJCC 7th Edition     Clinical stage from 02/01/2015: Stage IA (T1a, N0, M0) - Signed by [name], MD on 06/04/2015     Pathologic stage from 11/10/2015: Stage IA (T1b, N0, M0) - Signed by [name], MD on 12/08/2015 Cancer of sigmoid colon [facility])   Staging form: Colon and Rectum, AJCC 7th Edition     Clinical: Stage I (T2, N0, M0) - Unsigned     Breast cancer of lower-outer quadrant of right female breast ([name])   2006 Cancer Diagnosis    Left breast DCIS diagnosed in 2006 and 2010, status post lumpectomy.      01/07/2014 Initial Diagnosis    Breast cancer      01/18/2015 Mammogram    31m mass in lower outer quadrant of right breast       02/01/2015 Initial Biopsy    (R) breast mass biopsy showed invasive  ductal carcinoma & DCIS, grade 1-2.       02/01/2015 Receptors her2    ER 90%+, PR 10%+, Ki67 10%, HER2 negative (ratio 1.09)       11/07/2015 Surgery    (R) breast lumpectomy and sentinel lymph node biopsy ([name]      11/07/2015 Pathology Results    (R) breast lumpectomy showed invasive ductal carcinoma, grade 2, 1 cm, low-grade DCIS, negative margins, 3 sentinel lymph nodes were negative. LVI (-). HER2 repeated and remains negative (ratio 1.29).        11/07/2015 Pathologic Stage    pT1b,pN0: Stage IA       11/07/2015 Oncotype testing    RS 22, which predicts a year risk of distant recurrence with tamoxifen alone 14% (intermediate-risk). Adjuvant chemo was not offered      02/21/2016 - 04/10/2016 Radiation Therapy    Adjuvant breast radiation. ([name]). Right breast: 50.4 Gy in 28 fractions.  Right breast boost: 12 Gy in 6 fractions.      05/2016 -  Anti-estrogen oral therapy    Femara (Letrozole) 2.5 mg daily.       10/17/2016 Mammogram    MM DIAG BREAST TOMO BILATERAL 10/17/2016 FINDINGS: There are bilateral lumpectomy changes in the upper central left breast and upper central right breast. No mass, nonsurgical distortion, or suspicious microcalcification is identified in either breast to suggest malignancy. Mammographic images were processed with CAD. IMPRESSION: No evidence of malignancy in either breast. Lumpectomy changes bilaterally. RECOMMENDATION: Diagnostic mammogram is suggested in  1 year. (Code:[id number])       Cancer of sigmoid colon ([name])   03/04/2014 Initial Diagnosis    Cancer of sigmoid colon      03/09/2014 Pathology Results    G1 invasive adenocarcinoma, no lymph-Vascular invasion or Peri-neural invasion: Absent. MMR normal, MSI stable.       03/09/2014 Surgery    partial colectomy, negative margins.       11/10/2014 Relapse/Recurrence    biopsy confirmed oligo liver recurrence       11/10/2014 Imaging    PET showed two small ares of hypermetabolism in  the right lobe of the liver, no other distant metastasis.       11/30/2014 Imaging    abdomen MRI showed a new enhancing lesion which corresponds to an area of abnormal hyper metabolism on recent PET-CT. Stable enlarged portal caval lymph node.       01/02/2015 Pathology Results    Liver biopsy showed metastatic adenocarcinoma, IHC (+) for CK20, CDX-2, (-) for TTF-1 and ER      01/02/2015 Miscellaneous    liver biopsy KRAS mutation (+)       02/14/2015 Imaging    CT showed:  the small metastatic right hepatic lobe liver lesion is not identified for certain on this examination. No new lesions. Stable small cysts in segment 4 a.       03/05/2015 - 04/16/2015 Chemotherapy    CAPEOX (capecitabine 2500 mg twice daily Day 1-14, oxaliplatin 1 30 mg/m on day 1, every 21 days, S/P 3 cycles       08/24/2015 Procedure    CT-guided oligo liver metastasis microwave ablation by Dr. [name]      06/17/2016 Imaging    CT of the chest/abd/pelvis with contrast 1. Several new small pulmonary nodules worrisome for metastatic disease. 2. New large area of probable infiltrating recurrent tumor in the right hepatic lobe surrounding the prior ablation site. 3. Small supraclavicular lymph nodes.  Attention on future studies. 4. Stable surgical changes involving both breasts. No breast masses are identified. 5. Stable bilateral adrenal gland nodules. 6. Stable upper abdominal and right-sided retroperitoneal lymph nodes.      07/08/2016 PET scan    1. Large mass in the right lobe of the liver demonstrates diffuse hypermetabolism, compatible with recurrent metastatic disease in the liver 2. Multiple small pulmonary nodules are very similar to the recent Chest CT 06/17/16.  3. No other findings of metastatic disease elsewhere in the neck, chest, abdomen, or pelvis. 4. There is some low-level hypermetabolic activity adjacent to the surgical clips in the lateral aspect of the right breast at the site of prior  lumpectomy.       07/29/2016 Pathology Results    Liver biopsy confirmed metastatic colon cancer       07/31/2016 - 07/31/2017 Chemotherapy    Chemotherapy FOLFIRI and Avastin (from cycle 2) and Leucovorin (added from cycle 8) every 2 weeks, started on 07/31/2016, multiple delay per pt's request; chemo break from 5/30-6/21/18; decreased leucovorin to 2000 mg/m2, irinotecan to 126m/m2, 5-fu 20060mm2 from 03/05/17, due to poor tolerance. Started chemo break since 07/31/17.       10/23/2016 Imaging    CT ABDOMEN PELVIS W CONTRAST 10/24/16 IMPRESSION: 1. The numerous tiny bilateral pulmonary nodules seen on the previous imaging exams are no longer evident, suggesting response of therapy. 2. Large ill-defined irregular right hepatic lesion measures smaller on today's study. 3. Slight increase in size of hepatoduodenal ligament lymphadenopathy. 4. Abdominal Aortic Atherosclerois (ICD10-170.0)  01/05/2017 Imaging    CT CAP IMPRESSION: 1. Left lower lobe pulmonary nodule and hepatic metastatic disease are stable. 2. Hepatoduodenal ligament lymph node is stable. 3. Aortic atherosclerosis (ICD10-170.0). Coronary artery calcification. 4. Enlarged pulmonary arteries, indicative of pulmonary arterial hypertension. 5. Small bilateral adrenal nodules are unchanged.      05/09/2017 Imaging    CT CAP IMPRESSION: 1. Stable appearance left lower lobe pulmonary nodule with an apparent new tiny posterior right upper lobe pulmonary nodule. 2. Dominant ill-defined right hepatic mass measures smaller on today's study. 3. Slight decrease in the hepatoduodenal ligament lymph node. 4. Aortic Atherosclerois (ICD10-170.0)      05/09/2017 Imaging    CT CAP 05/09/17 IMPRESSION: 1. Stable appearance left lower lobe pulmonary nodule with an apparent new tiny posterior right upper lobe pulmonary nodule. Continued attention on follow-up recommended. 2. Dominant ill-defined right hepatic mass measures  smaller on today's study. 3. Slight decrease in the hepatoduodenal ligament lymph node. 4.  Aortic Atherosclerois (ICD10-170.0)       08/11/2017 Imaging    CT CAP W Contrast 08/11/17 IMPRESSION: 1. Essentially stable appearance of small pulmonary nodules, hypodense mass laterally in the right hepatic lobe, and mild portacaval adenopathy. No new or progressive lesions. 2. Other imaging findings of potential clinical significance: Aortic Atherosclerosis (ICD10-I70.0). Lower lumbar impingement due to spondylosis and degenerative disc disease. Uterine fibroids. Bilateral renal cysts. Coronary atherosclerosis. Mild mitral valve calcification. Mild cardiomegaly.        09/24/2017 -  Chemotherapy    Maintenance Xeloda 2500 mg twice daily days 1 through 14 of each cycle of treatment and Avastin given every 3 weeks starting on 09/24/2017.      CURRENT TREATMENT:   1. Letrozole 2.5 mg once daily, started in September 2017 2. Chemotherapy FOLFIRI and Avastin (from cycle 2) and Leucovorin (added from cycle 8) every 2 weeks, started on 07/31/2016, multiple delay per pt's request; chemo break from 5/30-6/21/18; decreased leucovorin to 2000 mg/m2, irinotecan to 166m/m2, 5-fu 20029mm2 from 03/05/17, due to poor tolerance. Started chemo break since 07/31/17.  INTERVAL HISTORY: Ms. SaThayereturns for follow up as scheduled. Last avastin/xeloda cycle began 1/10. She began new cycle of xeloda 2500 mg BID this morning; she has 4 more days left but copay is $300. Right side/flank pain occasionally fluctuates to 6/10, she takes 1-3 tabs per day Tylenol #4. No nausea, vomiting, constipation, diarrhea, or mucositis. Eating and drinking well. No pain or sensitivity to hands or feet, fingers are slightly dark. She has increased fatigue, no fever, chills, cough, or dyspnea. Has had night sweats last 2 nights, but it is warm in her home, a/c is not on and she can't open windows. Denies bone/joint pain. She  is searching for PCP.   REVIEW OF SYSTEMS:   Constitutional: Denies fevers, chills or abnormal weight loss (+) fatigue (+) night sweats x2 nights Eyes: Denies blurriness of vision Ears, nose, mouth, throat, and face: Denies mucositis or sore throat Respiratory: Denies cough, dyspnea or wheezes Cardiovascular: Denies palpitation, chest discomfort or lower extremity swelling Gastrointestinal:  Denies nausea, vomiting, constipation, diarrhea, heartburn or change in bowel habits (+) fluctuant right side/flank pain GU: Denies abnormal vaginal bleeding  Skin: Denies abnormal skin rashes (+) mild redness to hands, no sensitivity  Lymphatics: Denies new lymphadenopathy or easy bruising Neurological:Denies numbness, tingling or new weaknesses Behavioral/Psych: Mood is stable, no new changes  All other systems were reviewed with the patient and are negative.  MEDICAL HISTORY:  Past Medical History:  Diagnosis Date  . Anxiety   . Breast CA ([place])    radiation and surgery-lt.  . Cancer ([place])    left breast cancer x2  . Hypertension   . Liver lesion   . Pulmonary embolism ([place])    "blood clot in lungs" -dx. 4'15 with CT Chest. On Xarelto  . Radiation 01/22/10-03/12/10   left whole breast 4500 cGy, upper aspect boosted to 6120 cGy  . Radiation 02/21/16 - 04/10/16   right breast 50.4 Gy, right breast boost 12 Gy  . Shortness of breath    sob with exertion. dx. with Pulmonary emboli 4'15 ,tx. Xarelto,Lovenox used for [employer].    SURGICAL HISTORY: Past Surgical History:  Procedure Laterality Date  . BREAST LUMPECTOMY WITH RADIOACTIVE SEED AND SENTINEL LYMPH NODE BIOPSY Right 11/07/2015   Procedure: BREAST LUMPECTOMY WITH RADIOACTIVE SEED AND SENTINEL LYMPH NODE BIOPSY;  Surgeon: Stark Klein, MD;  Location: [place];  Service: General;  Laterality: Right;  . BREAST SURGERY     '11- Left breast lumpectomy  . CESAREAN SECTION    . CHOLECYSTECTOMY     Laparoscopic 20 yrs ago  .  COLON SURGERY  03-09-14   partial colectomy for cancer  . IR GENERIC HISTORICAL  07/29/2016   IR FLUORO GUIDE PORT INSERTION RIGHT 07/29/2016 Sandi Mariscal, MD [facility]  . IR GENERIC HISTORICAL  07/29/2016   IR US GUIDE VASC ACCESS RIGHT 07/29/2016 Sandi Mariscal, MD [facility]  . IR GENERIC HISTORICAL  07/29/2016   IR US GUIDE BX ASP/DRAIN 07/29/2016 [facility]  . PARTIAL COLECTOMY N/A 03/09/2014   Procedure: LAPAROSCOPIC ASSISTED PARTIAL COLECTOMY, splenic flexure mobilization;  Surgeon: Leighton Ruff, MD;  Location: [facility];  Service: General;  Laterality: N/A;  . RADIOFREQUENCY ABLATION Right 08/24/2015   Procedure: MICROWAVE ABLATION RIGHT LIVER LOBE ;  Surgeon: Corrie Mckusick, DO;  Location: [facility];  Service: Anesthesiology;  Laterality: Right;    I have reviewed the social history and family history with the patient and they are unchanged from previous note.  ALLERGIES:  is allergic to tramadol.  MEDICATIONS:  Current Outpatient Medications  Medication Sig Dispense Refill  . acetaminophen-codeine (TYLENOL #4) 300-60 MG tablet Take 1 tablet by mouth every 8 (eight) hours as needed. for pain 70 tablet 0  . amLODipine (NORVASC) 5 MG tablet Take 1 tablet (5 mg total) by mouth daily. 30 tablet 3  . capecitabine (XELODA) 500 MG tablet Take 5 tablets by mouth twice daily after a meal. Take for 14 days on, 7 days off. 140 tablet 0  . hydrochlorothiazide (HYDRODIURIL) 25 MG tablet Take 1 tablet (25 mg total) by mouth every morning. 30 tablet 0  . letrozole (FEMARA) 2.5 MG tablet Take 1 tablet (2.5 mg total) by mouth daily. 30 tablet 3  . lidocaine-prilocaine (EMLA) cream Apply 1 application topically as needed. 30 g 2  . metoprolol tartrate (LOPRESSOR) 50 MG tablet Take 1 tablet (50 mg total) by mouth 2 (two) times daily. 60 tablet 0  . potassium chloride SA (K-DUR,KLOR-CON) 20 MEQ tablet Take 1 tablet (20 mEq total) by mouth 2 (two) times daily. 60 tablet 2  . rivaroxaban (XARELTO) 20 MG  TABS tablet Take 1 tablet (20 mg total) by mouth daily. 30 tablet 3  . dexamethasone (DECADRON) 4 MG tablet Take 1 tablet (4 mg total) by mouth daily. (Patient not taking: Reported on 10/23/2017) 20 tablet 1  . ferrous sulfate 325 (65 FE) MG tablet Take 325 mg by mouth  daily with breakfast. Pt takes  4 times per week.    Marland Kitchen LORazepam (ATIVAN) 0.5 MG tablet Take 1 tablet (0.5 mg total) by mouth every 8 (eight) hours. (Patient not taking: Reported on 10/23/2017) 30 tablet 0  . magic mouthwash w/lidocaine SOLN Take 5 mLs by mouth 4 (four) times daily as needed for mouth pain. (Patient not taking: Reported on 10/23/2017) 240 mL 1  . ondansetron (ZOFRAN) 8 MG tablet Take 1 tablet (8 mg total) by mouth 2 (two) times daily as needed for refractory nausea / vomiting. Start on day 3 after chemotherapy. (Patient not taking: Reported on 10/23/2017) 30 tablet 1  . prochlorperazine (COMPAZINE) 10 MG tablet Take 1 tablet (10 mg total) by mouth every 6 (six) hours as needed (NAUSEA). (Patient not taking: Reported on 10/23/2017) 30 tablet 2  . promethazine (PHENERGAN) 25 MG tablet Take 1 tablet (25 mg total) by mouth every 6 (six) hours as needed for nausea or vomiting. (Patient not taking: Reported on 10/23/2017) 30 tablet 2  . zolpidem (AMBIEN) 5 MG tablet Take 1 tablet (5 mg total) by mouth at bedtime as needed for sleep. (Patient not taking: Reported on 10/23/2017) 20 tablet 0   No current facility-administered medications for this visit.     PHYSICAL EXAMINATION: ECOG PERFORMANCE STATUS: 2 - Symptomatic, <50% confined to bed  Vitals:   10/23/17 1236  BP: (!) 152/87  Pulse: 63  Resp: 20  Temp: 98.3 F (36.8 C)  SpO2: 98%   Filed Weights   10/23/17 1236  Weight: 280 lb 6.4 oz (127.2 kg)    GENERAL:alert, no distress and comfortable SKIN: skin color, texture, turgor are normal, no rashes or significant lesions (+) mild palmar erythema with digital hyperpigmentation  EYES: normal, Conjunctiva are pink and  non-injected, sclera clear OROPHARYNX:no exudate, no erythema and lips, buccal mucosa, and tongue normal  NECK: supple, thyroid normal size, non-tender, without nodularity LYMPH:  no palpable cervical, supraclavicular, or axillary lymphadenopathy  LUNGS: clear to auscultation bilaterally with normal breathing effort HEART: regular rate & rhythm and no murmurs and no lower extremity edema ABDOMEN:abdomen soft, round, non-tender and normal bowel sounds Musculoskeletal:no cyanosis of digits and no clubbing  NEURO: alert & oriented x 3 with fluent speech, no focal motor/sensory deficits BREASTS: inspection shows symmetrical pendulous breasts without nipple discharge. Lumpectomy incisions are well healed. No palpable mass in right breast or axilla that I could appreciate. (+) left breast s/p lumpectomy, incision has healed well, pea-sized nodularity just below left edge of surgical incision. No palpable abnormality in left breast or axilla that I could appreciate.  PAC without erythema   LABORATORY DATA:  I have reviewed the data as listed CBC Latest Ref Rng & Units 10/23/2017 09/24/2017 08/13/2017  WBC 3.9 - 10.3 K/uL 8.4 8.4 7.8  Hemoglobin 11.6 - 15.9 g/dL 13.1 13.5 12.5  Hematocrit 34.8 - 46.6 % 40.0 40.7 38.0  Platelets 145 - 400 K/uL 216 246 263     CMP Latest Ref Rng & Units 10/23/2017 09/24/2017 08/13/2017  Glucose 70 - 140 mg/dL 99 99 103  BUN 7 - 26 mg/dL 10 11 7.8  Creatinine 0.60 - 1.10 mg/dL 0.79 0.84 0.8  Sodium 136 - 145 mmol/L 140 141 141  Potassium 3.5 - 5.1 mmol/L 3.2(L) 3.6 3.7  Chloride 98 - 109 mmol/L 103 108 -  CO2 22 - 29 mmol/L '27 23 24  ' Calcium 8.4 - 10.4 mg/dL 9.6 9.9 9.4  Total Protein 6.4 - 8.3 g/dL 8.1  8.0 7.3  Total Bilirubin 0.2 - 1.2 mg/dL 0.7 0.4 0.32  Alkaline Phos 40 - 150 U/L 109 87 43  AST 5 - 34 U/L 54(H) 45(H) 35(H)  ALT 0 - 55 U/L 50 47 34   CEA  01/18/2014: 0.8 03/01/2015: 1.3 01/31/2016: 4.8 05/28/2016: 48 07/31/2016: 173.7 08/28/16:  139 09/25/2016: 43 11/04/16: 9.94 12/18/2016: 6.99 01/29/17: 4.58 03/04/17: 6.09 04/09/17: 4.22 05/07/17: 5.29 06/04/17: 4.12 07/02/17: 6.78 07/30/17 : 5.35  09/24/17 5.41  PATHOLOGY RESULT Diagnosis 01/02/2015 Liver, needle/core biopsy, right - POSITIVE FOR METASTATIC ADENOCARCINOMA. - SEE COMMENT.   Diagnosis 11/07/2015 1. Breast, lumpectomy, Right - INVASIVE DUCTAL CARCINOMA, GRADE 2, SPANNING 1 CM. - DUCTAL CARCINOMA IN SITU, LOW GRADE. - RESECTION MARGINS ARE NEGATIVE FOR INVASIVE CARCINOMA. - DUCTAL CARCINOMA IN SITU COMES TO WITHIN 0.3 CM OF THE POSTERIOR MARGIN. - BIOPSY SITE. - SEE ONCOLOGY TABLE. 2. Lymph node, sentinel, biopsy, right axillary #1 - ONE OF ONE LYMPH NODES NEGATIVE FOR CARCINOMA (0/1). 3. Lymph node, sentinel, biopsy, right axillary #2 - ONE OF ONE LYMPH NODES NEGATIVE FOR CARCINOMA (0/1). 4. Lymph node, sentinel, biopsy, right axillary #3 - ONE OF ONE LYMPH NODES NEGATIVE FOR CARCINOMA (0/1).  ONCOTYPE DX: RS 77, which predicts a year risk of distant recurrence with tamoxifen alone 14%   Diagnosis 07/29/2016 Liver, needle/core biopsy, posterior of right lobe METASTATIC ADENOCARCINOMA, CONSISTENT WITH COLONIC PRIMARY.    RADIOGRAPHIC STUDIES: I have personally reviewed the radiological images as listed and agreed with the findings in the report. No results found.   ASSESSMENT & PLAN: Veronica Reyes 62 y.o. female   1. Colon Cancer, Stage I (pT2N0), MMR normal, oligo recurrent disease in liver in 12/2014, KRAS mutation (+), liver and lung recurrence in 06/2016 -Veronica Reyes appears stable today. She tolerates Xeloda/avastin well overall with mild fatigue. She began another cycle on Xeloda 2500 mg BID this morning. She has mild palmar-erythema. I suggest she purchase OTC hydrocortisone cream and use TID PRN. I wrote this down for her. CBC and CMP stable, adequate to proceed with next cycle of Avastin/Xeloda, 2500 mg BID x2 weeks  on and 1 week off q3 weeks -Emily from specialty pharmacy visited the patient during today's exam.  She assisted the patient with information and approval for co-pay assistance grant.  Refilled medication for her today. -Return for lab and follow-up in 3 weeks with next Avastin/Xeloda cycle  2. Right breast cancer, pT1bN0M0, stage Ia, ER+/PR+/HER2-, History of left Breast Cancer, diagnosed on 02/01/2015 -She continues letrozole daily, has mild night sweats but otherwise tolerates well. -Her breast exam revealed a small area of nodularity at the healed left lumpectomy incision site but otherwise was unremarkable.  She is due for annual mammogram this month, I ordered today.   3. History of PE (01/06/14) -Continues Xarelto daily  4. Uterine fibroids -Recent CT CAP reveals fibroid changes in the uterus, no adnexal mass. -She denies pelvic pain, no abnormal vaginal bleeding. -We will monitor  5. Kidney lesion, right -CT CAP on 08/11/2017 notes bilateral renal cysts -Not hypermetabolic on 4132 PET  6. Right flank pain, knee discomfort -Her pain fluctuates, well managed with tylenol #4, refilled on 10/20/17  7. Morbid obesity -Weight is stable -I encouraged her to eat well and be active, I recommended she increase activity to improve her fatigue  8. HTN -Continues amlodipine, metoprolol, hydrochlorothiazide; [facility] -BP elevated today, will recheck prior to avastin  9. Hypokalemia  -  She ran out of potassium lately, K 3.2 today; I refilled for her and she will restart 1 tab BID   PLAN -Labs reviewed, continue Avastin/Xeloda 2500 mg BID x 2 weeks on/1 week off cycle q3 weeks -Xeloda refilled today to [facility] topical hydrocortisone cream to hands and feet as needed 3 times daily -Refilled EMLA cream and oral K, restart taking 1 tablet BID -Mammogram due 10/2017, ordered today -Return in 3 weeks for f/u and next cycle  Xeloda/Avastin   Orders Placed This Encounter  Procedures  . MM DIAG BREAST TOMO BILATERAL    Standing Status:   Future    Standing Expiration Date:   10/23/2018    Order Specific Question:   Reason for Exam (SYMPTOM  OR DIAGNOSIS REQUIRED)    Answer:   h/o right breast cancer    Order Specific Question:   Is the patient pregnant?    Answer:   No    Order Specific Question:   Preferred imaging location?    Answer:   [facility]   All questions were answered. The patient knows to call the clinic with any problems, questions or concerns. No barriers to learning was detected. I spent 20 minutes counseling the patient face to face. The total time spent in the appointment was 25 minutes and more than 50% was on counseling and review of test results     Alla Feeling, NP 10/23/17

## 2017-10-23 NOTE — Telephone Encounter (Signed)
Oral Oncology Patient Advocate Encounter  Was successful in securing patient a $ 4000 grant from [facility] [place] (PAF) to provide copayment coverage for her capecitabine.  This will keep the out of pocket expense at $0.    I have spoken with the patient.    The billing information is as follows and has been shared with [place].   I have also requested that they expedite the processing of her prescription.  They will reach out to the patient ASAP.    Member ID: [id number] Group ID: [id number] RxBin: [id number] Dates of Eligibility: 04/26/2017 through 10/24/2018  [name]. [name], [place] Patient [place] [phone] 10/23/2017 1:32 PM

## 2017-10-23 NOTE — Progress Notes (Signed)
Per Lacie NP ok to tx with today's BP

## 2017-10-27 NOTE — Telephone Encounter (Signed)
Oral Oncology Patient Advocate Encounter  Followed up on the status of the patient's prescription for Xeloda.   As of this morning they had left 2 messages for the patient but had not received a return call.  I contacted Veronica Reyes and asked her to contact the pharmacy to arrange shipment.   Shipment has been arranged with [place] and will arrive at her home on 2/14 for a $0 copayment.    [name]. [name], [place] Patient [place] [phone] 10/27/2017 12:04 PM

## 2017-10-29 ENCOUNTER — Other Ambulatory Visit: Payer: Self-pay | Admitting: Hematology

## 2017-11-05 ENCOUNTER — Other Ambulatory Visit: Payer: BLUE CROSS/BLUE SHIELD

## 2017-11-05 ENCOUNTER — Ambulatory Visit: Payer: BLUE CROSS/BLUE SHIELD

## 2017-11-05 ENCOUNTER — Telehealth: Payer: Self-pay | Admitting: Pharmacy Technician

## 2017-11-05 NOTE — Telephone Encounter (Signed)
Oral Oncology Patient Advocate Encounter  Was successful in securing patient an $ 4000 grant from [facility] [place] (PAF) to provide copayment coverage for her Xeloda.  This will keep the out of pocket expense at $0.    I have spoken with the patient.    The billing information is as follows and has been shared with [place].   Member ID: [id number] Group ID: [id number] RxBin: [id number] Dates of Eligibility: 04/26/2017 through 10/24/2018  [name]. [name], [place] Patient [place] [phone] 11/05/2017 4:05 PM

## 2017-11-07 ENCOUNTER — Other Ambulatory Visit: Payer: Self-pay | Admitting: Hematology

## 2017-11-09 ENCOUNTER — Other Ambulatory Visit: Payer: Self-pay | Admitting: *Deleted

## 2017-11-09 ENCOUNTER — Encounter: Payer: Self-pay | Admitting: Hematology

## 2017-11-09 MED ORDER — METOPROLOL TARTRATE 50 MG PO TABS: 50.0000 mg | ORAL_TABLET | Freq: Two times a day (BID) | ORAL | 0 refills | Status: DC

## 2017-11-09 MED ORDER — HYDROCHLOROTHIAZIDE 25 MG PO TABS: 25.0000 mg | ORAL_TABLET | ORAL | 0 refills | Status: DC

## 2017-11-09 NOTE — Progress Notes (Signed)
Called patient to provide numbers to [facility] for possible assistance with transportation as she had discussed concern before. Patient has used all of her grant funds.

## 2017-11-09 NOTE — Telephone Encounter (Signed)
Received vm call from [facility] asking for refill on pt's metoprolol & HCTZ.

## 2017-11-11 ENCOUNTER — Other Ambulatory Visit: Payer: Self-pay | Admitting: Hematology

## 2017-11-11 DIAGNOSIS — C187 Malignant neoplasm of sigmoid colon: Secondary | ICD-10-CM

## 2017-11-11 MED ORDER — CAPECITABINE 500 MG PO TABS: ORAL_TABLET | ORAL | 2 refills | Status: DC

## 2017-11-13 ENCOUNTER — Inpatient Hospital Stay: Payer: BLUE CROSS/BLUE SHIELD | Attending: Hematology

## 2017-11-13 ENCOUNTER — Inpatient Hospital Stay (HOSPITAL_BASED_OUTPATIENT_CLINIC_OR_DEPARTMENT_OTHER): Payer: BLUE CROSS/BLUE SHIELD | Admitting: Nurse Practitioner

## 2017-11-13 ENCOUNTER — Inpatient Hospital Stay: Payer: BLUE CROSS/BLUE SHIELD

## 2017-11-13 ENCOUNTER — Encounter: Payer: Self-pay | Admitting: Nurse Practitioner

## 2017-11-13 VITALS — BP 131/73

## 2017-11-13 VITALS — BP 157/95 | HR 73 | Temp 99.2°F | Resp 20 | Ht 66.0 in | Wt 278.2 lb

## 2017-11-13 DIAGNOSIS — D490 Neoplasm of unspecified behavior of digestive system: Secondary | ICD-10-CM

## 2017-11-13 DIAGNOSIS — C187 Malignant neoplasm of sigmoid colon: Secondary | ICD-10-CM

## 2017-11-13 DIAGNOSIS — R232 Flushing: Secondary | ICD-10-CM

## 2017-11-13 DIAGNOSIS — Z86711 Personal history of pulmonary embolism: Secondary | ICD-10-CM | POA: Diagnosis not present

## 2017-11-13 DIAGNOSIS — C50911 Malignant neoplasm of unspecified site of right female breast: Secondary | ICD-10-CM | POA: Diagnosis not present

## 2017-11-13 DIAGNOSIS — M542 Cervicalgia: Secondary | ICD-10-CM | POA: Diagnosis not present

## 2017-11-13 DIAGNOSIS — C787 Secondary malignant neoplasm of liver and intrahepatic bile duct: Secondary | ICD-10-CM | POA: Diagnosis not present

## 2017-11-13 DIAGNOSIS — C50511 Malignant neoplasm of lower-outer quadrant of right female breast: Secondary | ICD-10-CM | POA: Insufficient documentation

## 2017-11-13 DIAGNOSIS — R197 Diarrhea, unspecified: Secondary | ICD-10-CM

## 2017-11-13 DIAGNOSIS — Z95828 Presence of other vascular implants and grafts: Secondary | ICD-10-CM

## 2017-11-13 DIAGNOSIS — E876 Hypokalemia: Secondary | ICD-10-CM | POA: Diagnosis not present

## 2017-11-13 DIAGNOSIS — Z853 Personal history of malignant neoplasm of breast: Secondary | ICD-10-CM | POA: Diagnosis not present

## 2017-11-13 DIAGNOSIS — C7800 Secondary malignant neoplasm of unspecified lung: Secondary | ICD-10-CM | POA: Diagnosis not present

## 2017-11-13 DIAGNOSIS — Z5112 Encounter for antineoplastic immunotherapy: Secondary | ICD-10-CM | POA: Diagnosis not present

## 2017-11-13 DIAGNOSIS — I10 Essential (primary) hypertension: Secondary | ICD-10-CM | POA: Diagnosis not present

## 2017-11-13 DIAGNOSIS — E669 Obesity, unspecified: Secondary | ICD-10-CM | POA: Diagnosis not present

## 2017-11-13 LAB — CMP (CANCER CENTER ONLY)
ALK PHOS: 127 U/L (ref 40–150)
ALT: 58 U/L — AB (ref 0–55)
AST: 74 U/L — AB (ref 5–34)
Albumin: 3.3 g/dL — ABNORMAL LOW (ref 3.5–5.0)
Anion gap: 9 (ref 3–11)
BUN: 10 mg/dL (ref 7–26)
CALCIUM: 10.4 mg/dL (ref 8.4–10.4)
CHLORIDE: 103 mmol/L (ref 98–109)
CO2: 28 mmol/L (ref 22–29)
CREATININE: 0.9 mg/dL (ref 0.60–1.10)
Glucose, Bld: 96 mg/dL (ref 70–140)
Potassium: 3.8 mmol/L (ref 3.5–5.1)
SODIUM: 140 mmol/L (ref 136–145)
Total Bilirubin: 0.7 mg/dL (ref 0.2–1.2)
Total Protein: 8.5 g/dL — ABNORMAL HIGH (ref 6.4–8.3)

## 2017-11-13 LAB — CBC WITH DIFFERENTIAL (CANCER CENTER ONLY)
BASOS ABS: 0 10*3/uL (ref 0.0–0.1)
Basophils Relative: 0 %
Eosinophils Absolute: 0.1 10*3/uL (ref 0.0–0.5)
Eosinophils Relative: 1 %
HCT: 41.3 % (ref 34.8–46.6)
HEMOGLOBIN: 13.7 g/dL (ref 11.6–15.9)
LYMPHS ABS: 2.8 10*3/uL (ref 0.9–3.3)
LYMPHS PCT: 32 %
MCH: 32.3 pg (ref 25.1–34.0)
MCHC: 33.2 g/dL (ref 31.5–36.0)
MCV: 97.4 fL (ref 79.5–101.0)
Monocytes Absolute: 0.9 10*3/uL (ref 0.1–0.9)
Monocytes Relative: 10 %
NEUTROS PCT: 57 %
Neutro Abs: 4.9 10*3/uL (ref 1.5–6.5)
PLATELETS: 275 10*3/uL (ref 145–400)
RBC: 4.24 MIL/uL (ref 3.70–5.45)
RDW: 19.9 % — ABNORMAL HIGH (ref 11.2–14.5)
WBC: 8.8 10*3/uL (ref 3.9–10.3)

## 2017-11-13 LAB — CEA (IN HOUSE-CHCC): CEA (CHCC-In House): 12.32 ng/mL — ABNORMAL HIGH (ref 0.00–5.00)

## 2017-11-13 LAB — TOTAL PROTEIN, URINE DIPSTICK: PROTEIN: NEGATIVE mg/dL

## 2017-11-13 MED ORDER — SODIUM CHLORIDE 0.9 % IV SOLN: Freq: Once | INTRAVENOUS | Status: DC

## 2017-11-13 MED ORDER — SODIUM CHLORIDE 0.9 % IV SOLN
7.7000 mg/kg | Freq: Once | INTRAVENOUS | Status: AC
  Administered 2017-11-13: 1000 mg via INTRAVENOUS
  Filled 2017-11-13: qty 32

## 2017-11-13 MED ORDER — SODIUM CHLORIDE 0.9% FLUSH
10.0000 mL | INTRAVENOUS | Status: DC | PRN
  Filled 2017-11-13: qty 10

## 2017-11-13 MED ORDER — ACETAMINOPHEN-CODEINE #4 300-60 MG PO TABS: 1.0000 | ORAL_TABLET | Freq: Three times a day (TID) | ORAL | 0 refills | Status: DC | PRN

## 2017-11-13 NOTE — Patient Instructions (Signed)
[facility] Discharge Instructions for Patients Receiving Chemotherapy  Today you received the following chemotherapy agents Avastin  To help prevent nausea and vomiting after your treatment, we encourage you to take your nausea medication as directed   If you develop nausea and vomiting that is not controlled by your nausea medication, call the clinic.   BELOW ARE SYMPTOMS THAT SHOULD BE REPORTED IMMEDIATELY:  *FEVER GREATER THAN 100.5 F  *CHILLS WITH OR WITHOUT FEVER  NAUSEA AND VOMITING THAT IS NOT CONTROLLED WITH YOUR NAUSEA MEDICATION  *UNUSUAL SHORTNESS OF BREATH  *UNUSUAL BRUISING OR BLEEDING  TENDERNESS IN MOUTH AND THROAT WITH OR WITHOUT PRESENCE OF ULCERS  *URINARY PROBLEMS  *BOWEL PROBLEMS  UNUSUAL RASH Items with * indicate a potential emergency and should be followed up as soon as possible.  Feel free to call the clinic should you have any questions or concerns. The clinic phone number is [phone].  Please show the CHEMO ALERT CARD at check-in to the Emergency Department and triage nurse.   

## 2017-11-13 NOTE — Progress Notes (Addendum)
[place]  [phone] [phone]  Clinic Follow up Note   Patient Care Team: [name], MD as PCP - General (Internal Medicine) Leighton Ruff, MD as Consulting Physician (General Surgery) Carol Ada, MD as Consulting Physician (Gastroenterology) Concha Norway, MD (Inactive) as Consulting Physician (Internal Medicine) Emily Filbert, MD as Consulting Physician (Obstetrics and Gynecology) Tania Ade, RN as Registered Nurse (Medical Oncology) Stark Klein, MD as Consulting Physician (General Surgery) [name], MD as Consulting Physician (Hematology) 11/13/2017  SUMMARY OF ONCOLOGIC HISTORY: Oncology History   Breast cancer of lower-outer quadrant of right female breast [facility])   Staging form: Breast, AJCC 7th Edition     Clinical stage from 02/01/2015: Stage IA (T1a, N0, M0) - Signed by [name], MD on 06/04/2015     Pathologic stage from 11/10/2015: Stage IA (T1b, N0, M0) - Signed by [name], MD on 12/08/2015 Cancer of sigmoid colon [facility])   Staging form: Colon and Rectum, AJCC 7th Edition     Clinical: Stage I (T2, N0, M0) - Unsigned     Breast cancer of lower-outer quadrant of right female breast ([place])   2006 Cancer Diagnosis    Left breast DCIS diagnosed in 2006 and 2010, status post lumpectomy.      01/07/2014 Initial Diagnosis    Breast cancer      01/18/2015 Mammogram    35m mass in lower outer quadrant of right breast       02/01/2015 Initial Biopsy    (R) breast mass biopsy showed invasive ductal carcinoma & DCIS, grade 1-2.       02/01/2015 Receptors her2    ER 90%+, PR 10%+, Ki67 10%, HER2 negative (ratio 1.09)       11/07/2015 Surgery    (R) breast lumpectomy and sentinel lymph node biopsy ([name]      11/07/2015 Pathology Results    (R) breast lumpectomy showed invasive ductal carcinoma, grade 2, 1 cm, low-grade DCIS, negative margins, 3 sentinel lymph nodes were negative. LVI (-). HER2 repeated and remains  negative (ratio 1.29).        11/07/2015 Pathologic Stage    pT1b,pN0: Stage IA       11/07/2015 Oncotype testing    RS 22, which predicts a year risk of distant recurrence with tamoxifen alone 14% (intermediate-risk). Adjuvant chemo was not offered      02/21/2016 - 04/10/2016 Radiation Therapy    Adjuvant breast radiation. ([name]). Right breast: 50.4 Gy in 28 fractions.  Right breast boost: 12 Gy in 6 fractions.      05/2016 -  Anti-estrogen oral therapy    Femara (Letrozole) 2.5 mg daily.       10/17/2016 Mammogram    MM DIAG BREAST TOMO BILATERAL 10/17/2016 FINDINGS: There are bilateral lumpectomy changes in the upper central left breast and upper central right breast. No mass, nonsurgical distortion, or suspicious microcalcification is identified in either breast to suggest malignancy. Mammographic images were processed with CAD. IMPRESSION: No evidence of malignancy in either breast. Lumpectomy changes bilaterally. RECOMMENDATION: Diagnostic mammogram is suggested in 1 year. (Code:[id number])       [place]   03/04/2014 Initial Diagnosis    Cancer of sigmoid colon      03/09/2014 Pathology Results    G1 invasive adenocarcinoma, no lymph-Vascular invasion or Peri-neural invasion: Absent. MMR normal, MSI stable.       03/09/2014 Surgery    partial colectomy, negative margins.  11/10/2014 Relapse/Recurrence    biopsy confirmed oligo liver recurrence       11/10/2014 Imaging    PET showed two small ares of hypermetabolism in the right lobe of the liver, no other distant metastasis.       11/30/2014 Imaging    abdomen MRI showed a new enhancing lesion which corresponds to an area of abnormal hyper metabolism on recent PET-CT. Stable enlarged portal caval lymph node.       01/02/2015 Pathology Results    Liver biopsy showed metastatic adenocarcinoma, IHC (+) for CK20, CDX-2, (-) for TTF-1 and ER      01/02/2015 Miscellaneous    liver biopsy KRAS  mutation (+)       02/14/2015 Imaging    CT showed:  the small metastatic right hepatic lobe liver lesion is not identified for certain on this examination. No new lesions. Stable small cysts in segment 4 a.       03/05/2015 - 04/16/2015 Chemotherapy    CAPEOX (capecitabine 2500 mg twice daily Day 1-14, oxaliplatin 1 30 mg/m on day 1, every 21 days, S/P 3 cycles       08/24/2015 Procedure    CT-guided oligo liver metastasis microwave ablation by Dr. [name]      06/17/2016 Imaging    CT of the chest/abd/pelvis with contrast 1. Several new small pulmonary nodules worrisome for metastatic disease. 2. New large area of probable infiltrating recurrent tumor in the right hepatic lobe surrounding the prior ablation site. 3. Small supraclavicular lymph nodes.  Attention on future studies. 4. Stable surgical changes involving both breasts. No breast masses are identified. 5. Stable bilateral adrenal gland nodules. 6. Stable upper abdominal and right-sided retroperitoneal lymph nodes.      07/08/2016 PET scan    1. Large mass in the right lobe of the liver demonstrates diffuse hypermetabolism, compatible with recurrent metastatic disease in the liver 2. Multiple small pulmonary nodules are very similar to the recent Chest CT 06/17/16.  3. No other findings of metastatic disease elsewhere in the neck, chest, abdomen, or pelvis. 4. There is some low-level hypermetabolic activity adjacent to the surgical clips in the lateral aspect of the right breast at the site of prior lumpectomy.       07/29/2016 Pathology Results    Liver biopsy confirmed metastatic colon cancer       07/31/2016 - 07/31/2017 Chemotherapy    Chemotherapy FOLFIRI and Avastin (from cycle 2) and Leucovorin (added from cycle 8) every 2 weeks, started on 07/31/2016, multiple delay per pt's request; chemo break from 5/30-6/21/18; decreased leucovorin to 2000 mg/m2, irinotecan to 185m/m2, 5-fu 20060mm2 from 03/05/17, due to poor  tolerance. Started chemo break since 07/31/17.       10/23/2016 Imaging    CT ABDOMEN PELVIS W CONTRAST 10/24/16 IMPRESSION: 1. The numerous tiny bilateral pulmonary nodules seen on the previous imaging exams are no longer evident, suggesting response of therapy. 2. Large ill-defined irregular right hepatic lesion measures smaller on today's study. 3. Slight increase in size of hepatoduodenal ligament lymphadenopathy. 4. Abdominal Aortic Atherosclerois (ICD10-170.0)      01/05/2017 Imaging    CT CAP IMPRESSION: 1. Left lower lobe pulmonary nodule and hepatic metastatic disease are stable. 2. Hepatoduodenal ligament lymph node is stable. 3. Aortic atherosclerosis (ICD10-170.0). Coronary artery calcification. 4. Enlarged pulmonary arteries, indicative of pulmonary arterial hypertension. 5. Small bilateral adrenal nodules are unchanged.      05/09/2017 Imaging    CT CAP IMPRESSION: 1. Stable appearance left  lower lobe pulmonary nodule with an apparent new tiny posterior right upper lobe pulmonary nodule. 2. Dominant ill-defined right hepatic mass measures smaller on today's study. 3. Slight decrease in the hepatoduodenal ligament lymph node. 4. Aortic Atherosclerois (ICD10-170.0)      05/09/2017 Imaging    CT CAP 05/09/17 IMPRESSION: 1. Stable appearance left lower lobe pulmonary nodule with an apparent new tiny posterior right upper lobe pulmonary nodule. Continued attention on follow-up recommended. 2. Dominant ill-defined right hepatic mass measures smaller on today's study. 3. Slight decrease in the hepatoduodenal ligament lymph node. 4.  Aortic Atherosclerois (ICD10-170.0)       08/11/2017 Imaging    CT CAP W Contrast 08/11/17 IMPRESSION: 1. Essentially stable appearance of small pulmonary nodules, hypodense mass laterally in the right hepatic lobe, and mild portacaval adenopathy. No new or progressive lesions. 2. Other imaging findings of potential clinical  significance: Aortic Atherosclerosis (ICD10-I70.0). Lower lumbar impingement due to spondylosis and degenerative disc disease. Uterine fibroids. Bilateral renal cysts. Coronary atherosclerosis. Mild mitral valve calcification. Mild cardiomegaly.        09/24/2017 -  Chemotherapy    Maintenance Xeloda 2500 mg twice daily days 1 through 14 of each cycle of treatment and Avastin given every 3 weeks starting on 09/24/2017.      CURRENT TREATMENT:  1. Letrozole 2.5 mg once daily, started in September 2017 2. Chemotherapy FOLFIRI and Avastin (from cycle 2)andLeucovorin(added from cycle 8) every 2 weeks, started on 07/31/2016, multiple delay per pt's request; chemo break from 5/30-6/21/18; decreasedleucovorin to 2000 mg/m2,irinotecan to 120m/m2, 5-fu 20019mm2 from 03/05/17, due to poor tolerance.Started chemo break since 07/31/17.  INTERVAL HISTORY: Ms. SaHerminio Headseturns for follow-up as scheduled prior to next cycle Xeloda/Avastin.  Took 5 tablets this morning and is waiting on new shipment of medication.  He has moderate fatigue while on chemo that proves on her week off.  She is able to run errands and complete activities. Eating and drinking well. Denies fever or chills. Night sweats are increasing in frequency, occurring every night, but overall tolerable. Denies cough, chest pain, dyspnea, or palpitations. Had 1 episode of diarrhea since last visit, did not require imodium. No nausea, vomiting, constipation, or blood in stool. No mucositis. Hands are mildly dark but not sensitive or painful, no skin breakdown. Occasional right hand tingling that was present prior to chemo. Mild right flank pain radiates to abdomen is stable. She is having progressive right neck pain near port site with discomfort with head turning. She occasionally takes tylenol #4 which relieves her pain. PAC was last used 10/23/17 with avastin without difficulty.   REVIEW OF SYSTEMS:   Constitutional: Denies fevers,  chills or abnormal weight loss (+) fatigue (+) daily night sweats, tolerable Eyes: Denies blurriness of vision Ears, nose, mouth, throat, and face: Denies mucositis or sore throat Respiratory: Denies cough, dyspnea or wheezes Cardiovascular: Denies palpitation, chest discomfort or lower extremity swelling Gastrointestinal:  Denies nausea, vomiting, constipation, diarrhea, hematochezia, heartburn or change in bowel habits (+) right flank to abdomen pain, stable  GU: Denies dysuria or hematuria  Skin: Denies abnormal skin rashes (+) darkening to hands  Lymphatics: Denies new lymphadenopathy or easy bruising Neurological:Denies numbness, tingling or new weaknesses (+) periodic right hand tingling, present prior to chemo Behavioral/Psych: Mood is stable, no new changes  CHEST: (+) right neck pain near PAC site, discomfort with turning head  All other systems were reviewed with the patient and are negative.  MEDICAL HISTORY:  Past Medical History:  Diagnosis Date  . Anxiety   . Breast CA ([place])    radiation and surgery-lt.  . Cancer ([place])    left breast cancer x2  . Hypertension   . Liver lesion   . Pulmonary embolism ([place])    "blood clot in lungs" -dx. 4'15 with CT Chest. On Xarelto  . Radiation 01/22/10-03/12/10   left whole breast 4500 cGy, upper aspect boosted to 6120 cGy  . Radiation 02/21/16 - 04/10/16   right breast 50.4 Gy, right breast boost 12 Gy  . Shortness of breath    sob with exertion. dx. with Pulmonary emboli 4'15 ,tx. Xarelto,Lovenox used for [employer].    SURGICAL HISTORY: Past Surgical History:  Procedure Laterality Date  . BREAST LUMPECTOMY WITH RADIOACTIVE SEED AND SENTINEL LYMPH NODE BIOPSY Right 11/07/2015   Procedure: BREAST LUMPECTOMY WITH RADIOACTIVE SEED AND SENTINEL LYMPH NODE BIOPSY;  Surgeon: Stark Klein, MD;  Location: [place];  Service: General;  Laterality: Right;  . BREAST SURGERY     '11- Left breast lumpectomy  . CESAREAN SECTION    .  CHOLECYSTECTOMY     Laparoscopic 20 yrs ago  . COLON SURGERY  03-09-14   partial colectomy for cancer  . IR GENERIC HISTORICAL  07/29/2016   IR FLUORO GUIDE PORT INSERTION RIGHT 07/29/2016 Sandi Mariscal, MD [facility]  . IR GENERIC HISTORICAL  07/29/2016   IR US GUIDE VASC ACCESS RIGHT 07/29/2016 Sandi Mariscal, MD [facility]  . IR GENERIC HISTORICAL  07/29/2016   IR US GUIDE BX ASP/DRAIN 07/29/2016 [facility]  . PARTIAL COLECTOMY N/A 03/09/2014   Procedure: LAPAROSCOPIC ASSISTED PARTIAL COLECTOMY, splenic flexure mobilization;  Surgeon: Leighton Ruff, MD;  Location: [facility];  Service: General;  Laterality: N/A;  . RADIOFREQUENCY ABLATION Right 08/24/2015   Procedure: MICROWAVE ABLATION RIGHT LIVER LOBE ;  Surgeon: Corrie Mckusick, DO;  Location: [facility];  Service: Anesthesiology;  Laterality: Right;    I have reviewed the social history and family history with the patient and they are unchanged from previous note.  ALLERGIES:  is allergic to tramadol.  MEDICATIONS:  Current Outpatient Medications  Medication Sig Dispense Refill  . acetaminophen-codeine (TYLENOL #4) 300-60 MG tablet Take 1 tablet by mouth every 8 (eight) hours as needed. for pain 70 tablet 0  . amLODipine (NORVASC) 5 MG tablet Take 1 tablet (5 mg total) by mouth daily. 30 tablet 3  . capecitabine (XELODA) 500 MG tablet Take 5 tablets by mouth twice daily after a meal. Take for 14 days on, 7 days off. 140 tablet 2  . hydrochlorothiazide (HYDRODIURIL) 25 MG tablet Take 1 tablet (25 mg total) by mouth every morning. 30 tablet 0  . letrozole (FEMARA) 2.5 MG tablet Take 1 tablet (2.5 mg total) by mouth daily. 30 tablet 3  . metoprolol tartrate (LOPRESSOR) 50 MG tablet Take 1 tablet (50 mg total) by mouth 2 (two) times daily. 60 tablet 0  . potassium chloride SA (K-DUR,KLOR-CON) 20 MEQ tablet Take 1 tablet (20 mEq total) by mouth 2 (two) times daily. 60 tablet 2  . rivaroxaban (XARELTO) 20 MG TABS tablet Take 1 tablet (20 mg total)  by mouth daily. 30 tablet 3  . dexamethasone (DECADRON) 4 MG tablet Take 1 tablet (4 mg total) by mouth daily. (Patient not taking: Reported on 10/23/2017) 20 tablet 1  . ferrous sulfate 325 (65 FE) MG tablet Take 325 mg by mouth daily with breakfast. Pt takes  4 times per week.    Marland Kitchen  lidocaine-prilocaine (EMLA) cream Apply 1 application topically as needed. 30 g 2  . LORazepam (ATIVAN) 0.5 MG tablet Take 1 tablet (0.5 mg total) by mouth every 8 (eight) hours. (Patient not taking: Reported on 10/23/2017) 30 tablet 0  . magic mouthwash w/lidocaine SOLN Take 5 mLs by mouth 4 (four) times daily as needed for mouth pain. (Patient not taking: Reported on 10/23/2017) 240 mL 1  . ondansetron (ZOFRAN) 8 MG tablet Take 1 tablet (8 mg total) by mouth 2 (two) times daily as needed for refractory nausea / vomiting. Start on day 3 after chemotherapy. (Patient not taking: Reported on 10/23/2017) 30 tablet 1  . prochlorperazine (COMPAZINE) 10 MG tablet Take 1 tablet (10 mg total) by mouth every 6 (six) hours as needed (NAUSEA). (Patient not taking: Reported on 10/23/2017) 30 tablet 2  . promethazine (PHENERGAN) 25 MG tablet Take 1 tablet (25 mg total) by mouth every 6 (six) hours as needed for nausea or vomiting. (Patient not taking: Reported on 10/23/2017) 30 tablet 2  . zolpidem (AMBIEN) 5 MG tablet Take 1 tablet (5 mg total) by mouth at bedtime as needed for sleep. (Patient not taking: Reported on 10/23/2017) 20 tablet 0   No current facility-administered medications for this visit.    Facility-Administered Medications Ordered in Other Visits  Medication Dose Route Frequency Provider Last Rate Last Dose  . 0.9 %  sodium chloride infusion   Intravenous Once [name], MD        PHYSICAL EXAMINATION: ECOG PERFORMANCE STATUS: 2 - Symptomatic, <50% confined to bed  Vitals:   11/13/17 1012  BP: (!) 157/95  Pulse: 73  Resp: 20  Temp: 99.2 F (37.3 C)  SpO2: 97%   Filed Weights   11/13/17 1012  Weight: 278 lb 3.2 oz  (126.2 kg)    GENERAL:alert, no distress and comfortable SKIN: skin color, texture, turgor are normal, no rashes or significant lesions (+) mild palmar erythema with hyperpigmentation EYES: normal, Conjunctiva are pink and non-injected, sclera clear OROPHARYNX:no exudate, no erythema and lips, buccal mucosa, and tongue normal  NECK: supple, mild tenderness to palpation of right lower neck at clavicle, no edema  LYMPH:  no palpable cervical, supraclavicular lymphadenopathy LUNGS: clear to auscultation bilaterally with normal breathing effort HEART: regular rate & rhythm and no murmurs and no lower extremity edema ABDOMEN:abdomen soft, non-tender and normal bowel sounds Musculoskeletal:no cyanosis of digits and no clubbing  NEURO: alert & oriented x 3 with fluent speech, no focal motor/sensory deficits BREAST: exam deferred  PAC without edema or erythema  LABORATORY DATA:  I have reviewed the data as listed CBC Latest Ref Rng & Units 11/13/2017 10/23/2017 09/24/2017  WBC 3.9 - 10.3 K/uL 8.8 8.4 8.4  Hemoglobin 11.6 - 15.9 g/dL - 13.1 13.5  Hematocrit 34.8 - 46.6 % 41.3 40.0 40.7  Platelets 145 - 400 K/uL 275 216 246     CMP Latest Ref Rng & Units 11/13/2017 10/23/2017 09/24/2017  Glucose 70 - 140 mg/dL 96 99 99  BUN 7 - 26 mg/dL '10 10 11  ' Creatinine 0.60 - 1.10 mg/dL 0.90 0.79 0.84  Sodium 136 - 145 mmol/L 140 140 141  Potassium 3.5 - 5.1 mmol/L 3.8 3.2(L) 3.6  Chloride 98 - 109 mmol/L 103 103 108  CO2 22 - 29 mmol/L '28 27 23  ' Calcium 8.4 - 10.4 mg/dL 10.4 9.6 9.9  Total Protein 6.4 - 8.3 g/dL 8.5(H) 8.1 8.0  Total Bilirubin 0.2 - 1.2 mg/dL 0.7 0.7 0.4  Alkaline  Phos 40 - 150 U/L 127 109 87  AST 5 - 34 U/L 74(H) 54(H) 45(H)  ALT 0 - 55 U/L 58(H) 50 47   CEA  01/18/2014: 0.8 03/01/2015: 1.3 01/31/2016: 4.8 05/28/2016: 48 07/31/2016: 173.7 08/28/16: 139 09/25/2016: 43 11/04/16: 9.94 12/18/2016: 6.99 01/29/17: 4.58 03/04/17: 6.09 04/09/17: 4.22 05/07/17: 5.29 06/04/17: 4.12 07/02/17:  6.78 07/30/17 :5.35 09/24/17 5.41 10/23/17: 8.03 11/13/17: 12.32  PATHOLOGY RESULT Diagnosis 01/02/2015 Liver, needle/core biopsy, right - POSITIVE FOR METASTATIC ADENOCARCINOMA. - SEE COMMENT.   Diagnosis 11/07/2015 1. Breast, lumpectomy, Right - INVASIVE DUCTAL CARCINOMA, GRADE 2, SPANNING 1 CM. - DUCTAL CARCINOMA IN SITU, LOW GRADE. - RESECTION MARGINS ARE NEGATIVE FOR INVASIVE CARCINOMA. - DUCTAL CARCINOMA IN SITU COMES TO WITHIN 0.3 CM OF THE POSTERIOR MARGIN. - BIOPSY SITE. - SEE ONCOLOGY TABLE. 2. Lymph node, sentinel, biopsy, right axillary #1 - ONE OF ONE LYMPH NODES NEGATIVE FOR CARCINOMA (0/1). 3. Lymph node, sentinel, biopsy, right axillary #2 - ONE OF ONE LYMPH NODES NEGATIVE FOR CARCINOMA (0/1). 4. Lymph node, sentinel, biopsy, right axillary #3 - ONE OF ONE LYMPH NODES NEGATIVE FOR CARCINOMA (0/1).  ONCOTYPE DX: RS 52, which predicts a year risk of distant recurrence with tamoxifen alone 14%   Diagnosis 07/29/2016 Liver, needle/core biopsy, posterior of right lobe METASTATIC ADENOCARCINOMA, CONSISTENT WITH COLONIC PRIMARY.     RADIOGRAPHIC STUDIES: I have personally reviewed the radiological images as listed and agreed with the findings in the report. No results found.   ASSESSMENT & PLAN: Veronica Reyes y.o.female  1. Colon Cancer, Stage I (pT2N0), MMR normal, oligo recurrent disease in liver in 12/2014, KRAS mutation (+), liver and lung recurrence in 06/2016 -Veronica Reyes appears stable today. She completed 2 cycles Xeloda 2500 mg BID x14 days on and 7 days off and avastin since her chemo break from 07/30/17 to 09/24/17. She is tolerating very well overall. I encouraged her to continue hydrocortisone cream to her hands for mild xeloda-induced palmar erythema. Labs reviewed, CBC and CMP stable, adequate for treatment. CEA trending up, now 12.32. Last imaging was 08/11/2017. Will obtain restaging CT after this cycle 3, which she  began today. F/u in 3 weeks with cycle 4. -RDW trending up. She has taken iron intermittently. Last iron studies low in 2015. Will check at next visit.   2. Right breast cancer, pT1bN0M0, stage Ia, ER+/PR+/HER2-, History of left Breast Cancer, diagnosed on 02/01/2015; on letrozole -She tolerates letrozole well overall. Hot flashes are manageable but increased in frequency. We discussed medication such as SSRI or gabapentin, but she declines at this time. She is overdue for mammogram, I ordered at last visit, I encouraged her to call today to schedule; she agrees.   3. PAC/right neck pain -She reports progressive PAC site/neck pain since January. PAC was used 3 weeks ago for treatment without difficulty. Exam is unremarkable. Will monitor. If she develops swelling or increased pain, will refer back to IR for eval.   4. H/o PE  -Continue Xarelto daily  5. Uterine fibroids  6. Kidney lesion, right  -CT CAP in 2018 with b/l renal cysts that are not hypermetabolic on PET  7. Right flank pain, knee discomfort -Stable; physical exam is unremarkable. I refilled tylenol #4 today  8. Morbid obesity -I encouraged her to eat well and be active.   9. HTN -Elevated initially today, down to 131/73 on recheck in infusion area; continue medication regimen and monitor closely  10. Hypokalemia - she ran out of oral K recently,  K down to 3.2 on 2/8; she restarted 1 tablet BID. K 3.8 today; given intermittent diarrhea she will continue oral K supplement for now.   PLAN: -Labs reviewed, begin cycle 3 Xeldoa 2500 mg BID x14 days on and 2 weeks off plus avastin today -Restaging CT after cycle 3, ordered today -Refilled Tylenol #4 -check Iron studies at next visit    Orders Placed This Encounter  Procedures  . CT Abdomen Pelvis W Contrast    Standing Status:   Future    Standing Expiration Date:   11/13/2018    Order Specific Question:   If indicated for the ordered procedure, I authorize the  administration of contrast media per Radiology protocol    Answer:   Yes    Order Specific Question:   Is patient pregnant?    Answer:   No    Order Specific Question:   Preferred imaging location?    Answer:   [facility]    Order Specific Question:   Radiology Contrast Protocol - do NOT remove file path    Answer:   [email or web address]    Order Specific Question:   Reason for Exam additional comments    Answer:   colon cancer and breast cancer, on maintenance xeloda, CEA trending up; restaging  . CT Chest W Contrast    Standing Status:   Future    Standing Expiration Date:   11/13/2018    Order Specific Question:   If indicated for the ordered procedure, I authorize the administration of contrast media per Radiology protocol    Answer:   Yes    Order Specific Question:   Is patient pregnant?    Answer:   No    Order Specific Question:   Preferred imaging location?    Answer:   [facility]    Order Specific Question:   Radiology Contrast Protocol - do NOT remove file path    Answer:   [email or web address]    Order Specific Question:   Reason for Exam additional comments    Answer:   colon cancer and breast cancer, on maintenance xeloda, CEA trending up; restaging  . Iron and TIBC    Standing Status:   Future    Standing Expiration Date:   11/13/2018  . Ferritin    Standing Status:   Future    Standing Expiration Date:   11/14/2018   All questions were answered. The patient knows to call the clinic with any problems, questions or concerns. No barriers to learning was detected. I spent 20 minutes counseling the patient face to face. The total time spent in the appointment was 25 minutes and more than 50% was on counseling and review of test results     Alla Feeling, NP 11/13/17   Addendum  I have seen the patient, examined her. I agree with the assessment and and plan and have edited the notes.   Veronica Reyes is  clinically stable. She is tolerating treatment Xeloda and Avastin well overall. Will continue. Plan to repeat staging scan after this cycle.  [name]  11/13/2017

## 2017-11-16 ENCOUNTER — Other Ambulatory Visit: Payer: Self-pay | Admitting: Hematology

## 2017-11-16 ENCOUNTER — Telehealth: Payer: Self-pay | Admitting: Hematology

## 2017-11-16 DIAGNOSIS — C187 Malignant neoplasm of sigmoid colon: Secondary | ICD-10-CM

## 2017-11-16 NOTE — Telephone Encounter (Signed)
Appointment rescheduled per 3/1 sch msg/ Letter/ Calendar mailed to patient

## 2017-11-26 ENCOUNTER — Ambulatory Visit: Payer: BLUE CROSS/BLUE SHIELD

## 2017-11-26 ENCOUNTER — Other Ambulatory Visit: Payer: BLUE CROSS/BLUE SHIELD

## 2017-11-26 ENCOUNTER — Other Ambulatory Visit: Payer: Self-pay | Admitting: Hematology

## 2017-11-30 ENCOUNTER — Telehealth: Payer: Self-pay | Admitting: Hematology

## 2017-11-30 NOTE — Telephone Encounter (Signed)
Patients spouse came by to pick up contrast material for her CT on 3/20

## 2017-12-01 ENCOUNTER — Ambulatory Visit (HOSPITAL_COMMUNITY): Payer: BLUE CROSS/BLUE SHIELD

## 2017-12-02 ENCOUNTER — Encounter (HOSPITAL_COMMUNITY): Payer: Self-pay | Admitting: Radiology

## 2017-12-02 ENCOUNTER — Ambulatory Visit (HOSPITAL_COMMUNITY)
Admission: RE | Admit: 2017-12-02 | Discharge: 2017-12-02 | Disposition: A | Discharge status: Home / Self Care | Payer: BLUE CROSS/BLUE SHIELD | Source: Ambulatory Visit | Attending: Nurse Practitioner | Admitting: Nurse Practitioner

## 2017-12-02 DIAGNOSIS — D490 Neoplasm of unspecified behavior of digestive system: Secondary | ICD-10-CM | POA: Insufficient documentation

## 2017-12-02 MED ORDER — IOPAMIDOL (ISOVUE-300) INJECTION 61%
INTRAVENOUS | Status: AC
  Administered 2017-12-02: 100 mL
  Filled 2017-12-02: qty 100

## 2017-12-02 NOTE — Progress Notes (Signed)
[facility]  [phone] [phone]  Clinic Follow Up Note   DATE OF VISIT: 12/04/2017   Veronica Docker, MD [address] [place] [address]  DIAGNOSIS: recurrent Cancer of sigmoid colon [facility]) - Plan: CBC with Differential ([place] Only), CMP ([place] only), acetaminophen-codeine (TYLENOL #4) 300-60 MG tablet, sodium chloride 0.9 % injection 10 mL  Malignant neoplasm of lower-outer quadrant of right breast of female, estrogen receptor positive ([place]) - Plan: sodium chloride 0.9 % injection 10 mL  Essential hypertension, benign  Obesity, Class III, BMI 40-49.9 (morbid obesity) ([place])  Personal history of venous thrombosis and embolism  PROBLEM LIST 1. Left breast DCIS diagnosed in 2006 and 2010, status post lumpectomy. 2. pT2N0M0 stage I colon cancer, s/p partial colectomy on 03/09/2014  3. PE in 12/2013 when she was diagnosed with colon cancer. 4. 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 [facility])   Staging form: Breast, AJCC 7th Edition     Clinical stage from 02/01/2015: Stage IA (T1a, N0, M0) - Signed by [name], MD on 06/04/2015     Pathologic stage from 11/10/2015: Stage IA (T1b, N0, M0) - Signed by [name], MD on 12/08/2015 Cancer of sigmoid colon [facility])   Staging form: Colon and Rectum, AJCC 7th Edition     Clinical: Stage I (T2, N0, M0) - Unsigned     Breast cancer of lower-outer quadrant of right female breast ([place])   2006 Cancer Diagnosis    Left breast DCIS diagnosed in 2006 and 2010, status post lumpectomy.      01/07/2014 Initial Diagnosis    Breast cancer      01/18/2015 Mammogram    69m mass in lower outer quadrant of right breast       02/01/2015 Initial Biopsy    (R) breast mass biopsy showed invasive ductal carcinoma & DCIS, grade 1-2.       02/01/2015 Receptors her2    ER 90%+, PR 10%+, Ki67 10%,  HER2 negative (ratio 1.09)       11/07/2015 Surgery    (R) breast lumpectomy and sentinel lymph node biopsy ([name]      11/07/2015 Pathology Results    (R) breast lumpectomy showed invasive ductal carcinoma, grade 2, 1 cm, low-grade DCIS, negative margins, 3 sentinel lymph nodes were negative. LVI (-). HER2 repeated and remains negative (ratio 1.29).        11/07/2015 Pathologic Stage    pT1b,pN0: Stage IA       11/07/2015 Oncotype testing    RS 22, which predicts a year risk of distant recurrence with tamoxifen alone 14% (intermediate-risk). Adjuvant chemo was not offered      02/21/2016 - 04/10/2016 Radiation Therapy    Adjuvant breast radiation. ([name]). Right breast: 50.4 Gy in 28 fractions.  Right breast boost: 12 Gy in 6 fractions.      05/2016 -  Anti-estrogen oral therapy    Femara (Letrozole) 2.5 mg daily.       10/17/2016 Mammogram    MM DIAG BREAST TOMO BILATERAL 10/17/2016 FINDINGS: There are bilateral lumpectomy changes in the upper central left breast and upper central right breast. No mass, nonsurgical distortion, or suspicious microcalcification is identified in either breast to suggest malignancy. Mammographic images were processed with CAD. IMPRESSION: No evidence of malignancy in either breast. Lumpectomy changes bilaterally. RECOMMENDATION: Diagnostic mammogram is suggested in 1 year. (  Code:DM-B-01Y)       Cancer of sigmoid colon ([place])   03/04/2014 Initial Diagnosis    Cancer of sigmoid colon      03/09/2014 Pathology Results    G1 invasive adenocarcinoma, no lymph-Vascular invasion or Peri-neural invasion: Absent. MMR normal, MSI stable.       03/09/2014 Surgery    partial colectomy, negative margins.       11/10/2014 Relapse/Recurrence    biopsy confirmed oligo liver recurrence       11/10/2014 Imaging    PET showed two small ares of hypermetabolism in the right lobe of the liver, no other distant metastasis.       11/30/2014 Imaging    abdomen MRI  showed a new enhancing lesion which corresponds to an area of abnormal hyper metabolism on recent PET-CT. Stable enlarged portal caval lymph node.       01/02/2015 Pathology Results    Liver biopsy showed metastatic adenocarcinoma, IHC (+) for CK20, CDX-2, (-) for TTF-1 and ER      01/02/2015 Miscellaneous    liver biopsy KRAS mutation (+)       02/14/2015 Imaging    CT showed:  the small metastatic right hepatic lobe liver lesion is not identified for certain on this examination. No new lesions. Stable small cysts in segment 4 a.       03/05/2015 - 04/16/2015 Chemotherapy    CAPEOX (capecitabine 2500 mg twice daily Day 1-14, oxaliplatin 1 30 mg/m on day 1, every 21 days, S/P 3 cycles       08/24/2015 Procedure    CT-guided oligo liver metastasis microwave ablation by Dr. [name]      06/17/2016 Imaging    CT of the chest/abd/pelvis with contrast 1. Several new small pulmonary nodules worrisome for metastatic disease. 2. New large area of probable infiltrating recurrent tumor in the right hepatic lobe surrounding the prior ablation site. 3. Small supraclavicular lymph nodes.  Attention on future studies. 4. Stable surgical changes involving both breasts. No breast masses are identified. 5. Stable bilateral adrenal gland nodules. 6. Stable upper abdominal and right-sided retroperitoneal lymph nodes.      07/08/2016 PET scan    1. Large mass in the right lobe of the liver demonstrates diffuse hypermetabolism, compatible with recurrent metastatic disease in the liver 2. Multiple small pulmonary nodules are very similar to the recent Chest CT 06/17/16.  3. No other findings of metastatic disease elsewhere in the neck, chest, abdomen, or pelvis. 4. There is some low-level hypermetabolic activity adjacent to the surgical clips in the lateral aspect of the right breast at the site of prior lumpectomy.       07/29/2016 Pathology Results    Liver biopsy confirmed metastatic colon cancer        07/31/2016 - 07/31/2017 Chemotherapy    Chemotherapy FOLFIRI and Avastin (from cycle 2) and Leucovorin (added from cycle 8) every 2 weeks, started on 07/31/2016, multiple delay per pt's request; chemo break from 5/30-6/21/18; decreased leucovorin to 2000 mg/m2, irinotecan to 128m/m2, 5-fu 20023mm2 from 03/05/17, due to poor tolerance. Started chemo break since 07/31/17.       10/23/2016 Imaging    CT ABDOMEN PELVIS W CONTRAST 10/24/16 IMPRESSION: 1. The numerous tiny bilateral pulmonary nodules seen on the previous imaging exams are no longer evident, suggesting response of therapy. 2. Large ill-defined irregular right hepatic lesion measures smaller on today's study. 3. Slight increase in size of hepatoduodenal ligament lymphadenopathy. 4. Abdominal Aortic Atherosclerois (ICD10-170.0)  01/05/2017 Imaging    CT CAP IMPRESSION: 1. Left lower lobe pulmonary nodule and hepatic metastatic disease are stable. 2. Hepatoduodenal ligament lymph node is stable. 3. Aortic atherosclerosis (ICD10-170.0). Coronary artery calcification. 4. Enlarged pulmonary arteries, indicative of pulmonary arterial hypertension. 5. Small bilateral adrenal nodules are unchanged.      05/09/2017 Imaging    CT CAP IMPRESSION: 1. Stable appearance left lower lobe pulmonary nodule with an apparent new tiny posterior right upper lobe pulmonary nodule. 2. Dominant ill-defined right hepatic mass measures smaller on today's study. 3. Slight decrease in the hepatoduodenal ligament lymph node. 4. Aortic Atherosclerois (ICD10-170.0)      05/09/2017 Imaging    CT CAP 05/09/17 IMPRESSION: 1. Stable appearance left lower lobe pulmonary nodule with an apparent new tiny posterior right upper lobe pulmonary nodule. Continued attention on follow-up recommended. 2. Dominant ill-defined right hepatic mass measures smaller on today's study. 3. Slight decrease in the hepatoduodenal ligament lymph node. 4.  Aortic  Atherosclerois (ICD10-170.0)       08/11/2017 Imaging    CT CAP W Contrast 08/11/17 IMPRESSION: 1. Essentially stable appearance of small pulmonary nodules, hypodense mass laterally in the right hepatic lobe, and mild portacaval adenopathy. No new or progressive lesions. 2. Other imaging findings of potential clinical significance: Aortic Atherosclerosis (ICD10-I70.0). Lower lumbar impingement due to spondylosis and degenerative disc disease. Uterine fibroids. Bilateral renal cysts. Coronary atherosclerosis. Mild mitral valve calcification. Mild cardiomegaly.        09/24/2017 -  Chemotherapy    Maintenance Xeloda 2500 mg twice daily days 1 through 14 of each cycle of treatment and Avastin given every 3 weeks starting on 09/24/2017.       12/02/2017 Imaging    CT CAP W Contrast 12/02/17 IMPRESSION: Chest Impression: 1. Interval increase in size of bilateral small pulmonary nodules. 2. No mediastinal adenopathy  Abdomen / Pelvis Impression: 1. Dominant lesion in the RIGHT hepatic lobe is similar; however there are 2 adjacent low-density lesions in the RIGHT hepatic lobe which are not appreciated on comparison exam and concerning recurrent hepatic metastasis. 2. Interval increase in size periaortic retroperitoneal lymph nodes consistent with recurrent metastatic adenopathy.      CURRENT TREATMENT:   1. Letrozole 2.5 mg once daily, started in September 2017 2. Maintenance Xeloda2500 mg twice daily days 1 through 14 of each cycle of treatmentand Avastin given every 3 weeks starting on 09/24/2017. Plan to stop on 12/18/17. PENDING Irinotecan plus Avastin every 2 weeks started on 12/25/17  INTERIM HISTORY:   Veronica Reyes 62 y.o. female with a history of recurrent left breast cancer, colon cancer s/p laparoscopic-assisted partial colectomy (03/09/2014), PE (01/06/2014), and right breast cancer (01/2015), presents to clinic for follow up. She presents to the clinic today by  herself.   She reports she is tolerating Xeloda with complaints off fatigue and blackening on her fingers. She endorses good appetite and regular bowel movements. She has abdominal pain that is cancer related that she takes Tylenol #4 for.   On review of systems, pt denies any other complaints at this time. Pertinent positives are listed and detailed within the above HPI.   MEDICAL HISTORY: Past Medical History:  Diagnosis Date  . Anxiety   . Breast CA ([place])    radiation and surgery-lt.  . Cancer ([place])    left breast cancer x2  . Hypertension   . Liver lesion   . Pulmonary embolism ([place])    "blood clot in lungs" -dx. 4'15 with CT Chest.  On Xarelto  . Radiation 01/22/10-03/12/10   left whole breast 4500 cGy, upper aspect boosted to 6120 cGy  . Radiation 02/21/16 - 04/10/16   right breast 50.4 Gy, right breast boost 12 Gy  . Shortness of breath    sob with exertion. dx. with Pulmonary emboli 4'15 ,tx. Xarelto,Lovenox used for [employer].    ALLERGIES:  is allergic to tramadol.  MEDICATIONS:  Allergies as of 12/04/2017      Reactions   Tramadol Other (See Comments)   Dizziness      Medication List        Accurate as of 12/04/17 10:22 AM. Always use your most recent med list.          acetaminophen-codeine 300-60 MG tablet Commonly known as:  TYLENOL #4 Take 1 tablet by mouth every 8 (eight) hours as needed. for pain   amLODipine 5 MG tablet Commonly known as:  NORVASC Take 1 tablet (5 mg total) by mouth daily.   capecitabine 500 MG tablet Commonly known as:  XELODA Take 5 tablets by mouth twice daily after a meal. Take for 14 days on, 7 days off.   dexamethasone 4 MG tablet Commonly known as:  DECADRON Take 1 tablet (4 mg total) by mouth daily.   ferrous sulfate 325 (65 FE) MG tablet Take 325 mg by mouth daily with breakfast. Pt takes  4 times per week.   hydrochlorothiazide 25 MG tablet Commonly known as:  HYDRODIURIL Take 1 tablet (25 mg total) by mouth every  morning.   letrozole 2.5 MG tablet Commonly known as:  FEMARA Take 1 tablet (2.5 mg total) by mouth daily.   lidocaine-prilocaine cream Commonly known as:  EMLA Apply 1 application topically as needed.   LORazepam 0.5 MG tablet Commonly known as:  ATIVAN Take 1 tablet (0.5 mg total) by mouth every 8 (eight) hours.   magic mouthwash w/lidocaine Soln Take 5 mLs by mouth 4 (four) times daily as needed for mouth pain.   metoprolol tartrate 50 MG tablet Commonly known as:  LOPRESSOR Take 1 tablet (50 mg total) by mouth 2 (two) times daily.   ondansetron 8 MG tablet Commonly known as:  ZOFRAN Take 1 tablet (8 mg total) by mouth 2 (two) times daily as needed for refractory nausea / vomiting. Start on day 3 after chemotherapy.   potassium chloride SA 20 MEQ tablet Commonly known as:  K-DUR,KLOR-CON Take 1 tablet (20 mEq total) by mouth 2 (two) times daily.   prochlorperazine 10 MG tablet Commonly known as:  COMPAZINE Take 1 tablet (10 mg total) by mouth every 6 (six) hours as needed (NAUSEA).   promethazine 25 MG tablet Commonly known as:  PHENERGAN Take 1 tablet (25 mg total) by mouth every 6 (six) hours as needed for nausea or vomiting.   rivaroxaban 20 MG Tabs tablet Commonly known as:  XARELTO Take 1 tablet (20 mg total) by mouth daily.   zolpidem 5 MG tablet Commonly known as:  AMBIEN Take 1 tablet (5 mg total) by mouth at bedtime as needed for sleep.       SURGICAL HISTORY:  Past Surgical History:  Procedure Laterality Date  . BREAST LUMPECTOMY WITH RADIOACTIVE SEED AND SENTINEL LYMPH NODE BIOPSY Right 11/07/2015   Procedure: BREAST LUMPECTOMY WITH RADIOACTIVE SEED AND SENTINEL LYMPH NODE BIOPSY;  Surgeon: Stark Klein, MD;  Location: [facility];  Service: General;  Laterality: Right;  . BREAST SURGERY     '11- Left breast lumpectomy  .  CESAREAN SECTION    . CHOLECYSTECTOMY     Laparoscopic 20 yrs ago  . COLON SURGERY  03-09-14   partial colectomy  for cancer  . IR GENERIC HISTORICAL  07/29/2016   IR FLUORO GUIDE PORT INSERTION RIGHT 07/29/2016 Sandi Mariscal, MD [facility]  . IR GENERIC HISTORICAL  07/29/2016   IR US GUIDE VASC ACCESS RIGHT 07/29/2016 Sandi Mariscal, MD [facility]  . IR GENERIC HISTORICAL  07/29/2016   IR US GUIDE BX ASP/DRAIN 07/29/2016 [facility]  . PARTIAL COLECTOMY N/A 03/09/2014   Procedure: LAPAROSCOPIC ASSISTED PARTIAL COLECTOMY, splenic flexure mobilization;  Surgeon: Leighton Ruff, MD;  Location: [facility];  Service: General;  Laterality: N/A;  . RADIOFREQUENCY ABLATION Right 08/24/2015   Procedure: MICROWAVE ABLATION RIGHT LIVER LOBE ;  Surgeon: Corrie Mckusick, DO;  Location: [facility];  Service: Anesthesiology;  Laterality: Right;    REVIEW OF SYSTEMS: Constitutional: Denies fevers, chills or abnormal weight loss (+) mild fatigued (+) weight gain Eyes: Denies blurriness of vision Ears, nose, mouth, throat, and face: Denies mucositis.  Respiratory: Denies cough, dyspnea or wheezes Cardiovascular: Denies palpitation, chest discomfort or lower extremity swelling Gastrointestinal:  Denies heartburn or change in bowel habits.  Skin: Denies abnormal skin rashes Musculoskeletal: Denies concerning joint pains.  Lymphatics: Denies new lymphadenopathy or easy bruising Neurological:Denies numbness, tingling or new weaknesses Musculoskeletal: (+) left knee discomfort  Behavioral/Psych: Mood is stable, no new changes  All other systems were reviewed with the patient and are negative.    PHYSICAL EXAMINATION:  ECOG PERFORMANCE STATUS: 1-2  Blood pressure (!) 156/94, pulse 91, temperature 98.8 F (37.1 C), temperature source Oral, resp. rate 17, height '5\' 6"'  (1.676 m), weight 276 lb 11.2 oz (125.5 kg), last menstrual period 01/23/2014, SpO2 96 %.  Vitals:   12/04/17 0945  BP: (!) 156/94  Pulse: 91  Resp: 17  Temp: 98.8 F (37.1 C)  TempSrc: Oral  SpO2: 96%  Weight: 276 lb 11.2 oz (125.5 kg)  Height: '5\' 6"'  (1.676  m)    GENERAL:alert, no distress and comfortable; well developed and obese. Easily mobile to exam table.  SKIN: skin color, texture, turgor are normal, no rashes or significant lesions EYES: normal, Conjunctiva are pink and non-injected, sclera clear OROPHARYNX:no exudate, no erythema and lips, buccal mucosa, and tongue normal  NECK: supple, thyroid normal size, non-tender, without nodularity LYMPH:  no palpable lymphadenopathy in the cervical, axillary or supraclavicular LUNGS: clear to auscultation with normal breathing effort, no wheezes or rhonchi HEART: regular rate & rhythm and no murmurs and no lower extremity edema ABDOMEN:abdomen soft, non-tender and normal bowel sounds; incision healing with signs of infection.  Mild hepatomegaly with tenderness at the RUQ, no abdominal tenderness. No local megaly Musculoskeletal:no cyanosis of digits and no clubbing, limited ROM of left shoulder  NEURO: alert & oriented x 3 with fluent speech, no focal motor/sensory deficits  Labs:  CBC Latest Ref Rng & Units 11/13/2017 10/23/2017 09/24/2017  WBC 3.9 - 10.3 K/uL 8.8 8.4 8.4  Hemoglobin 11.6 - 15.9 g/dL - 13.1 13.5  Hematocrit 34.8 - 46.6 % 41.3 40.0 40.7  Platelets 145 - 400 K/uL 275 216 246    CMP Latest Ref Rng & Units 11/13/2017 10/23/2017 09/24/2017  Glucose 70 - 140 mg/dL 96 99 99  BUN 7 - 26 mg/dL '10 10 11  ' Creatinine 0.60 - 1.10 mg/dL 0.90 0.79 0.84  Sodium 136 - 145 mmol/L 140 140 141  Potassium 3.5 - 5.1 mmol/L 3.8 3.2(L) 3.6  Chloride 98 - 109 mmol/L 103 103 108  CO2 22 - 29 mmol/L '28 27 23  ' Calcium 8.4 - 10.4 mg/dL 10.4 9.6 9.9  Total Protein 6.4 - 8.3 g/dL 8.5(H) 8.1 8.0  Total Bilirubin 0.2 - 1.2 mg/dL 0.7 0.7 0.4  Alkaline Phos 40 - 150 U/L 127 109 87  AST 5 - 34 U/L 74(H) 54(H) 45(H)  ALT 0 - 55 U/L 58(H) 50 47    CEA  01/18/2014: 0.8 03/01/2015: 1.3 01/31/2016: 4.8 05/28/2016: 48 07/31/2016: 173.7 08/28/16: 139 09/25/2016: 43 11/04/16: 9.94 12/18/2016: 6.99 01/29/17:  4.58 03/04/17: 6.09 04/09/17: 4.22 05/07/17: 5.29 06/04/17: 4.12 07/02/17: 6.78 07/30/17 : 5.35  09/24/17: 5.41 10/23/17: 8.03 11/13/17: 12.32  PATHOLOGY RESULT Diagnosis 01/02/2015 Liver, needle/core biopsy, right - POSITIVE FOR METASTATIC ADENOCARCINOMA. - SEE COMMENT.   Diagnosis 11/07/2015 1. Breast, lumpectomy, Right - INVASIVE DUCTAL CARCINOMA, GRADE 2, SPANNING 1 CM. - DUCTAL CARCINOMA IN SITU, LOW GRADE. - RESECTION MARGINS ARE NEGATIVE FOR INVASIVE CARCINOMA. - DUCTAL CARCINOMA IN SITU COMES TO WITHIN 0.3 CM OF THE POSTERIOR MARGIN. - BIOPSY SITE. - SEE ONCOLOGY TABLE. 2. Lymph node, sentinel, biopsy, right axillary #1 - ONE OF ONE LYMPH NODES NEGATIVE FOR CARCINOMA (0/1). 3. Lymph node, sentinel, biopsy, right axillary #2 - ONE OF ONE LYMPH NODES NEGATIVE FOR CARCINOMA (0/1). 4. Lymph node, sentinel, biopsy, right axillary #3 - ONE OF ONE LYMPH NODES NEGATIVE FOR CARCINOMA (0/1).  ONCOTYPE DX: RS 79, which predicts a year risk of distant recurrence with tamoxifen alone 14%   Diagnosis 07/29/2016 Liver, needle/core biopsy, posterior of right lobe METASTATIC ADENOCARCINOMA, CONSISTENT WITH COLONIC PRIMARY.  RADIOLOGY STUDIES   PET 07/08/2016 IMPRESSION: 1. Large mass in the right lobe of the liver demonstrates diffuse hypermetabolism, compatible with recurrent metastatic disease in the liver. 2. Multiple small pulmonary nodules are very similar to the recent chest CT 06/17/2016. These do not demonstrate hypermetabolism on the PET images, however, these lesions are well below the level of PET imaging. Given that these nodules are new compared to prior chest CT 12/18/2015, they are concerning for potential metastatic disease to the lungs, and continued attention on followup studies is recommended. 3. No other findings of metastatic disease elsewhere in the neck, chest, abdomen or pelvis. 4. There is some low-level hypermetabolic activity adjacent to the surgical clips  in the lateral aspect of the right breast at the site of prior lumpectomy. This area has undergone interval involution over the several prior examinations, and this low-level activity is favored to be related to normal healing. Attention on follow-up imaging is recommended to ensure the stability or resolution of this finding. 5. Aortic atherosclerosis, in addition to 2 vessel coronary artery disease. Please note that although the presence of coronary artery calcium documents the presence of coronary artery disease, the severity of this disease and any potential stenosis cannot be assessed on this non-gated CT examination. Assessment for potential risk factor modification, dietary therapy or pharmacologic therapy may be warranted, if clinically indicated. 6. Additional incidental findings, as above.  MM DIAG BREAST TOMO BILATERAL 10/17/2016 FINDINGS: There are bilateral lumpectomy changes in the upper central left breast and upper central right breast. No mass, nonsurgical distortion, or suspicious microcalcification is identified in either breast to suggest malignancy. Mammographic images were processed with CAD. IMPRESSION: No evidence of malignancy in either breast. Lumpectomy changes bilaterally. RECOMMENDATION: Diagnostic mammogram is suggested in 1 year. (Code:DM-B-01Y)  CT CHEST, ABDOMEN PELVIS W CONTRAST 10/24/16 IMPRESSION: 1. The numerous tiny bilateral pulmonary nodules  seen on the previous imaging exams are no longer evident, suggesting response of therapy. 2. Large ill-defined irregular right hepatic lesion measures smaller on today's study. 3. Slight increase in size of hepatoduodenal ligament lymphadenopathy. 4. Abdominal Aortic Atherosclerois (ICD10-170.0)  CT CAP 01/05/17 IMPRESSION: 1. Left lower lobe pulmonary nodule and hepatic metastatic disease are stable. 2. Hepatoduodenal ligament lymph node is stable. 3. Aortic atherosclerosis (ICD10-170.0). Coronary  artery calcification. 4. Enlarged pulmonary arteries, indicative of pulmonary arterial hypertension. 5. Small bilateral adrenal nodules are unchanged.  CT CAP 05/09/17 IMPRESSION: 1. Stable appearance left lower lobe pulmonary nodule with an apparent new tiny posterior right upper lobe pulmonary nodule. Continued attention on follow-up recommended. 2. Dominant ill-defined right hepatic mass measures smaller on today's study. 3. Slight decrease in the hepatoduodenal ligament lymph node. 4.  Aortic Atherosclerois (ICD10-170.0)   CT CAP W Contrast 08/11/17 IMPRESSION: 1. Essentially stable appearance of small pulmonary nodules, hypodense mass laterally in the right hepatic lobe, and mild portacaval adenopathy. No new or progressive lesions. 2. Other imaging findings of potential clinical significance: Aortic Atherosclerosis (ICD10-I70.0). Lower lumbar impingement due to spondylosis and degenerative disc disease. Uterine fibroids. Bilateral renal cysts. Coronary atherosclerosis. Mild mitral valve calcification. Mild cardiomegaly.   CT CAP W Contrast 12/02/17 IMPRESSION: Chest Impression: 1. Interval increase in size of bilateral small pulmonary nodules. 2. No mediastinal adenopathy  Abdomen / Pelvis Impression: 1. Dominant lesion in the RIGHT hepatic lobe is similar; however there are 2 adjacent low-density lesions in the RIGHT hepatic lobe which are not appreciated on comparison exam and concerning recurrent hepatic metastasis. 2. Interval increase in size periaortic retroperitoneal lymph nodes consistent with recurrent metastatic adenopathy.  ASSESSMENT: Veronica Reyes 62 y.o. female   1. Colon Cancer, Stage I (pT2N0), MMR normal, oligo recurrent disease in liver in 12/2014, KRAS mutation (+), liver and lung recurrence in 06/2016 -I previously reviewed the nature history of metastatic colon cancer, and the potential curative approach with chemotherapy followed by  liver lesion ablation or resection. However, more likely, this is not curable disease. -She initially received 3 cycles of Capox, did not want to proceed with fourth cycle due to the moderate side effects from previous chemotherapy treatment. She declined liver mass resection, and underwent liver ablation by interventional radiologist Dr. Earleen Newport in 08/2015. - I previously reviewed her restaging CT scan from 06/17/2016, which unfortunately showed new area of infiltrative disease as the previous ablated liver lesion, likely recurrence. She also developed several new pulmonary nodules, small, concerning for metastatic disease. 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 -She started chemotherapy FOLFIRI and Avastin on 08/13/16. Inially she tolerated well but after many cycles she did develop side effects. I previously had to decrease her chemo dose a few times or pt requested a break several times for multiple reasons. Last FOLFIRI chemo was on 07/30/17 (cycle 18). -She did have a good response to chemo, her CEA significantly decreased. CT scan from 05/09/2017 showed continuous response to treatment, her liver metastasis slightly decreased in size. After CT CAP from 08/11/17 which showed stable controlled disease, I was able to switch her to maintenance therapy. She started Xeloda with Avastin every 3 weeks on 09/24/17.  -CT CAP W Contrast from 12/02/17 reveals interval increase in size of bilateral small pulmonary nodules and two new lesions in the right hepatic lobe, although her previous liver metastasis in the right lobe has decreased in size. I discussed these results with the pt  and reviewed his image with patient in person. Given the two new lesions and interval increase in her known disease, I do not think her current regimen is controlling her disease. I recommend her to restart FOLFIRI but she does not want to use 5-fu pump. I suggested  irinotecan alone with Avastin every 2 weeks, she is agreeable. Plan to switch in 3 weeks.  -proceed with last cycle of Avastin and Xeloda today -F/u in 3 weeks    2. Right breast cancer, [id number], stage Ia, ER+/PR+/HER2-, History of left Breast Cancer, diagnosed on 02/01/2015 --Left CIS in situ, s/p lumpectomy in 2006 and 2011 by Dr. Margot Chimes, and radiation in 2011 by Dr. Sondra Come.  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 Surgical margins were negative. -I previously reviewed her Oncotype DX result. Her recurrence score is 22, which predicts 14% 10 year risk of distant recurrence with tamoxifen alone. This is intermediate risk, based on her relatively low number, I do not recommend adjuvant chemotherapy. -She has completed adjuvant breast radiation with Dr. Sondra Come in July 2017.  -her [facility] level supported she is postmenopausal -She started adjuvant letrozole in 05/2016, tolerating well, we'll continue. -We previously discussed breast cancer surveillance, I strongly encouraged her to continue annual screening mammogram, self exam, and follow-up with [place] routinely. - Mammogram from 10/2016 showed no evidence for malignancy. There is no clinical concern for breast cancer recurrence. -Next mammogram is scheduled for 12/10/17  3. History of PE (01/06/2014) --She has a diagnosis of pulmonary embolism, likely secondary to malignancy in the setting of family history for stroke (and possible stroke history in patient as well). Lower extremity dopplers negative.  -continue Xarelto. Due to her metastatic colon cancer, I previously recommended her to continue indefinitely.  4.  Uterine Fibroids  --Negative endometrial biopsy. Following with Gynecology.  5. Kidney lesion (Right). -No hypermetabolic right kidney lesion on the pet scan -Repeat abdominal MRI 05/29/2015 showed unchanged right renal lesion, favored to represent a  complex/septate cyst.  6. Right flank pain, Knee discomfort - possible related to her liver metastasis -improved with chemo -Continue Tylenol No. 4 as needed for pain. Her abdominal pain overall has improved lately. -Controled with tylenol #4, refilled today (07/30/17) -I suggest she see a orthopedist for her knee issues.  -Pt requested 40 tablets of Tylenol #4 instead of 70 today, I refilled for her  7.  Morbid obesity - I have previously encouraged her to eat healthy and exercise more.  8. HTN  -She will continue medication and following up with her PCP -We discussed that Avastin can increase her blood pressure, I recommend her to continue monitoring at home -She was mildly hypertensive on 07/16/17, we will  increase her dose of Amlodipine to 29m if needed in the future.  -filled Amlodipine and HCTZ today, she is looking for new PCP  9. Hypokalemia  - she ran out of oral K recently, K down to 3.2 on 2/8; she restarted 1 tablet BID. K 3.8 on 3/1; given intermittent diarrhea she will continue oral K supplement for now.   Plan  - continue with last cycle Xeldoa 2500 mg BID plus avastin today -Switch chemo regimen to Avastin and irinotecan on 4/12 every 2 weeks, due to disease progression  -Refilled 40 tablets Tylenol #4, amplodipine and HCTZ -continue Letrozole  -f/u in 3 weeks   All questions were answered. The patient knows to call the clinic  with any problems, questions or concerns. We can certainly see the patient much sooner if necessary.  I spent 20 minutes counseling the patient face to face. The total time spent in the appointment was 25 minutes.  This document serves as a record of services personally performed by [name], MD. It was created on her behalf by Theresia Bough, a trained medical scribe. The creation of this record is based on the scribe's personal observations and the provider's statements to them.   I have reviewed the above documentation for accuracy and  completeness, and I agree with the above.   [name]  12/04/2017 10:22 AM

## 2017-12-04 ENCOUNTER — Inpatient Hospital Stay (HOSPITAL_BASED_OUTPATIENT_CLINIC_OR_DEPARTMENT_OTHER): Payer: BLUE CROSS/BLUE SHIELD | Admitting: Hematology

## 2017-12-04 ENCOUNTER — Inpatient Hospital Stay: Payer: BLUE CROSS/BLUE SHIELD

## 2017-12-04 ENCOUNTER — Encounter: Payer: Self-pay | Admitting: Hematology

## 2017-12-04 ENCOUNTER — Other Ambulatory Visit: Payer: BLUE CROSS/BLUE SHIELD

## 2017-12-04 VITALS — BP 156/94 | HR 91 | Temp 98.8°F | Resp 17 | Ht 66.0 in | Wt 276.7 lb

## 2017-12-04 DIAGNOSIS — C787 Secondary malignant neoplasm of liver and intrahepatic bile duct: Secondary | ICD-10-CM | POA: Diagnosis not present

## 2017-12-04 DIAGNOSIS — Z86711 Personal history of pulmonary embolism: Secondary | ICD-10-CM

## 2017-12-04 DIAGNOSIS — E876 Hypokalemia: Secondary | ICD-10-CM | POA: Diagnosis not present

## 2017-12-04 DIAGNOSIS — C187 Malignant neoplasm of sigmoid colon: Secondary | ICD-10-CM | POA: Diagnosis not present

## 2017-12-04 DIAGNOSIS — C7800 Secondary malignant neoplasm of unspecified lung: Secondary | ICD-10-CM | POA: Diagnosis not present

## 2017-12-04 DIAGNOSIS — Z170 Estrogen receptor positive status [ER+]: Secondary | ICD-10-CM

## 2017-12-04 DIAGNOSIS — I10 Essential (primary) hypertension: Secondary | ICD-10-CM

## 2017-12-04 DIAGNOSIS — G893 Neoplasm related pain (acute) (chronic): Secondary | ICD-10-CM

## 2017-12-04 DIAGNOSIS — Z6841 Body Mass Index (BMI) 40.0 and over, adult: Secondary | ICD-10-CM | POA: Diagnosis not present

## 2017-12-04 DIAGNOSIS — Z86000 Personal history of in-situ neoplasm of breast: Secondary | ICD-10-CM

## 2017-12-04 DIAGNOSIS — Z86718 Personal history of other venous thrombosis and embolism: Secondary | ICD-10-CM

## 2017-12-04 DIAGNOSIS — Z5112 Encounter for antineoplastic immunotherapy: Secondary | ICD-10-CM | POA: Diagnosis not present

## 2017-12-04 DIAGNOSIS — C50511 Malignant neoplasm of lower-outer quadrant of right female breast: Secondary | ICD-10-CM

## 2017-12-04 LAB — CBC WITH DIFFERENTIAL (CANCER CENTER ONLY)
BASOS ABS: 0 10*3/uL (ref 0.0–0.1)
BASOS PCT: 1 %
Eosinophils Absolute: 0.1 10*3/uL (ref 0.0–0.5)
Eosinophils Relative: 2 %
HEMATOCRIT: 39.5 % (ref 34.8–46.6)
HEMOGLOBIN: 13.2 g/dL (ref 11.6–15.9)
Lymphocytes Relative: 28 %
Lymphs Abs: 2.2 10*3/uL (ref 0.9–3.3)
MCH: 33.4 pg (ref 25.1–34.0)
MCHC: 33.5 g/dL (ref 31.5–36.0)
MCV: 99.6 fL (ref 79.5–101.0)
Monocytes Absolute: 0.8 10*3/uL (ref 0.1–0.9)
Monocytes Relative: 11 %
NEUTROS ABS: 4.6 10*3/uL (ref 1.5–6.5)
Neutrophils Relative %: 58 %
Platelet Count: 270 10*3/uL (ref 145–400)
RBC: 3.96 MIL/uL (ref 3.70–5.45)
RDW: 25.3 % — ABNORMAL HIGH (ref 11.2–14.5)
WBC Count: 7.7 10*3/uL (ref 3.9–10.3)

## 2017-12-04 LAB — CMP (CANCER CENTER ONLY)
ALBUMIN: 3.2 g/dL — AB (ref 3.5–5.0)
ALK PHOS: 123 U/L (ref 40–150)
ALT: 48 U/L (ref 0–55)
AST: 62 U/L — AB (ref 5–34)
Anion gap: 10 (ref 3–11)
BILIRUBIN TOTAL: 0.9 mg/dL (ref 0.2–1.2)
BUN: 10 mg/dL (ref 7–26)
CALCIUM: 10 mg/dL (ref 8.4–10.4)
CO2: 25 mmol/L (ref 22–29)
Chloride: 103 mmol/L (ref 98–109)
Creatinine: 0.82 mg/dL (ref 0.60–1.10)
GFR, Est AFR Am: >60 mL/min (ref >60)
GFR, Estimated: >60 mL/min (ref >60)
GLUCOSE: 98 mg/dL (ref 70–140)
Potassium: 3.3 mmol/L — ABNORMAL LOW (ref 3.5–5.1)
Sodium: 138 mmol/L (ref 136–145)
TOTAL PROTEIN: 8.2 g/dL (ref 6.4–8.3)

## 2017-12-04 LAB — FERRITIN: Ferritin: 254 ng/mL (ref 9–269)

## 2017-12-04 LAB — IRON AND TIBC
Iron: 69 ug/dL (ref 41–142)
Saturation Ratios: 21 % (ref 21–57)
TIBC: 326 ug/dL (ref 236–444)
UIBC: 257 ug/dL

## 2017-12-04 MED ORDER — SODIUM CHLORIDE 0.9 % IJ SOLN
10.0000 mL | Freq: Once | INTRAMUSCULAR | Status: AC
  Administered 2017-12-04: 10 mL
  Filled 2017-12-04: qty 10

## 2017-12-04 MED ORDER — SODIUM CHLORIDE 0.9 % IV SOLN
7.7000 mg/kg | Freq: Once | INTRAVENOUS | Status: AC
  Administered 2017-12-04: 1000 mg via INTRAVENOUS
  Filled 2017-12-04: qty 32

## 2017-12-04 MED ORDER — HEPARIN SOD (PORK) LOCK FLUSH 100 UNIT/ML IV SOLN
500.0000 [IU] | Freq: Once | INTRAVENOUS | Status: AC | PRN
  Administered 2017-12-04: 500 [IU]
  Filled 2017-12-04: qty 5

## 2017-12-04 MED ORDER — SODIUM CHLORIDE 0.9 % IV SOLN
Freq: Once | INTRAVENOUS | Status: AC
  Administered 2017-12-04: 11:00:00 via INTRAVENOUS

## 2017-12-04 MED ORDER — HYDROCHLOROTHIAZIDE 25 MG PO TABS: 25.0000 mg | ORAL_TABLET | ORAL | 1 refills | Status: DC

## 2017-12-04 MED ORDER — ACETAMINOPHEN-CODEINE #4 300-60 MG PO TABS: 1.0000 | ORAL_TABLET | Freq: Three times a day (TID) | ORAL | 0 refills | Status: DC | PRN

## 2017-12-04 MED ORDER — METOPROLOL TARTRATE 50 MG PO TABS: 50.0000 mg | ORAL_TABLET | Freq: Two times a day (BID) | ORAL | 2 refills | Status: DC

## 2017-12-04 MED ORDER — SODIUM CHLORIDE 0.9% FLUSH
10.0000 mL | INTRAVENOUS | Status: DC | PRN
  Administered 2017-12-04: 10 mL
  Filled 2017-12-04: qty 10

## 2017-12-04 NOTE — Patient Instructions (Signed)
[facility] Discharge Instructions for Patients Receiving Chemotherapy  Today you received the following chemotherapy agents Avastin  To help prevent nausea and vomiting after your treatment, we encourage you to take your nausea medication as directed   If you develop nausea and vomiting that is not controlled by your nausea medication, call the clinic.   BELOW ARE SYMPTOMS THAT SHOULD BE REPORTED IMMEDIATELY:  *FEVER GREATER THAN 100.5 F  *CHILLS WITH OR WITHOUT FEVER  NAUSEA AND VOMITING THAT IS NOT CONTROLLED WITH YOUR NAUSEA MEDICATION  *UNUSUAL SHORTNESS OF BREATH  *UNUSUAL BRUISING OR BLEEDING  TENDERNESS IN MOUTH AND THROAT WITH OR WITHOUT PRESENCE OF ULCERS  *URINARY PROBLEMS  *BOWEL PROBLEMS  UNUSUAL RASH Items with * indicate a potential emergency and should be followed up as soon as possible.  Feel free to call the clinic should you have any questions or concerns. The clinic phone number is [phone].  Please show the CHEMO ALERT CARD at check-in to the Emergency Department and triage nurse.   

## 2017-12-04 NOTE — Progress Notes (Signed)
Per Dr. Burr Medico ok to treat with Urine Protein from November 13, 2017.  Potassium at 3.3 ok to treat per Dr. Burr Medico.  Patient advised to increase potassium supplement at home to TID for 1 week per Dr. Burr Medico.

## 2017-12-07 ENCOUNTER — Telehealth: Payer: Self-pay | Admitting: Hematology

## 2017-12-07 NOTE — Telephone Encounter (Signed)
Appointment scheduled per 3/22 los

## 2017-12-10 ENCOUNTER — Other Ambulatory Visit: Payer: Self-pay | Admitting: *Deleted

## 2017-12-10 DIAGNOSIS — C187 Malignant neoplasm of sigmoid colon: Secondary | ICD-10-CM

## 2017-12-10 MED ORDER — CAPECITABINE 500 MG PO TABS: ORAL_TABLET | ORAL | 0 refills | Status: DC

## 2017-12-16 ENCOUNTER — Ambulatory Visit
Admission: RE | Admit: 2017-12-16 | Discharge: 2017-12-16 | Disposition: A | Discharge status: Home / Self Care | Payer: BLUE CROSS/BLUE SHIELD | Source: Ambulatory Visit | Attending: Nurse Practitioner | Admitting: Nurse Practitioner

## 2017-12-16 DIAGNOSIS — C50911 Malignant neoplasm of unspecified site of right female breast: Secondary | ICD-10-CM

## 2017-12-16 DIAGNOSIS — Z79811 Long term (current) use of aromatase inhibitors: Secondary | ICD-10-CM

## 2017-12-16 HISTORY — DX: Personal history of irradiation: Z92.3

## 2017-12-16 HISTORY — DX: Personal history of antineoplastic chemotherapy: Z92.21

## 2017-12-23 ENCOUNTER — Other Ambulatory Visit: Payer: Self-pay | Admitting: Hematology

## 2017-12-23 ENCOUNTER — Telehealth: Payer: Self-pay | Admitting: *Deleted

## 2017-12-23 NOTE — Telephone Encounter (Signed)
Pt called and left message re:  Pt has a chance to go to [facility], and pt is going.  Requested all appts for  Friday 12/25/17 to be rescheduled to next Friday  01/01/18.   Schedule message sent.  Regan Rakers, NP notified.

## 2017-12-24 ENCOUNTER — Telehealth: Payer: Self-pay | Admitting: Hematology

## 2017-12-24 NOTE — Telephone Encounter (Signed)
Scheduled appt per 4/11 sch msg - spoke with patient regarding new appts.

## 2017-12-25 ENCOUNTER — Other Ambulatory Visit: Payer: Self-pay | Admitting: Hematology

## 2017-12-25 ENCOUNTER — Other Ambulatory Visit: Payer: Self-pay | Admitting: Nurse Practitioner

## 2017-12-25 ENCOUNTER — Ambulatory Visit: Payer: BLUE CROSS/BLUE SHIELD | Admitting: Nurse Practitioner

## 2017-12-25 ENCOUNTER — Ambulatory Visit: Payer: BLUE CROSS/BLUE SHIELD

## 2017-12-25 ENCOUNTER — Other Ambulatory Visit: Payer: BLUE CROSS/BLUE SHIELD

## 2017-12-25 DIAGNOSIS — C187 Malignant neoplasm of sigmoid colon: Secondary | ICD-10-CM

## 2017-12-29 ENCOUNTER — Other Ambulatory Visit: Payer: Self-pay | Admitting: Nurse Practitioner

## 2017-12-29 DIAGNOSIS — I10 Essential (primary) hypertension: Secondary | ICD-10-CM

## 2017-12-29 DIAGNOSIS — C187 Malignant neoplasm of sigmoid colon: Secondary | ICD-10-CM

## 2017-12-29 MED ORDER — AMLODIPINE BESYLATE 5 MG PO TABS: 5.0000 mg | ORAL_TABLET | Freq: Every day | ORAL | 0 refills | Status: DC

## 2017-12-29 MED ORDER — ACETAMINOPHEN-CODEINE #4 300-60 MG PO TABS: 1.0000 | ORAL_TABLET | Freq: Three times a day (TID) | ORAL | 0 refills | Status: DC | PRN

## 2017-12-31 ENCOUNTER — Other Ambulatory Visit: Payer: Self-pay | Admitting: Hematology

## 2018-01-01 ENCOUNTER — Telehealth: Payer: Self-pay | Admitting: Hematology

## 2018-01-01 ENCOUNTER — Telehealth: Payer: Self-pay | Admitting: Nutrition

## 2018-01-01 ENCOUNTER — Inpatient Hospital Stay: Payer: BLUE CROSS/BLUE SHIELD

## 2018-01-01 ENCOUNTER — Encounter: Payer: Self-pay | Admitting: Nurse Practitioner

## 2018-01-01 ENCOUNTER — Inpatient Hospital Stay (HOSPITAL_BASED_OUTPATIENT_CLINIC_OR_DEPARTMENT_OTHER): Payer: BLUE CROSS/BLUE SHIELD | Admitting: Nurse Practitioner

## 2018-01-01 ENCOUNTER — Inpatient Hospital Stay: Payer: BLUE CROSS/BLUE SHIELD | Attending: Hematology

## 2018-01-01 VITALS — BP 146/91 | HR 64 | Temp 98.4°F | Resp 20 | Ht 66.0 in | Wt 275.1 lb

## 2018-01-01 DIAGNOSIS — R5383 Other fatigue: Secondary | ICD-10-CM | POA: Insufficient documentation

## 2018-01-01 DIAGNOSIS — E876 Hypokalemia: Secondary | ICD-10-CM

## 2018-01-01 DIAGNOSIS — C50511 Malignant neoplasm of lower-outer quadrant of right female breast: Secondary | ICD-10-CM

## 2018-01-01 DIAGNOSIS — Z95828 Presence of other vascular implants and grafts: Secondary | ICD-10-CM

## 2018-01-01 DIAGNOSIS — C787 Secondary malignant neoplasm of liver and intrahepatic bile duct: Secondary | ICD-10-CM

## 2018-01-01 DIAGNOSIS — C7800 Secondary malignant neoplasm of unspecified lung: Secondary | ICD-10-CM | POA: Diagnosis not present

## 2018-01-01 DIAGNOSIS — I10 Essential (primary) hypertension: Secondary | ICD-10-CM | POA: Diagnosis not present

## 2018-01-01 DIAGNOSIS — Z86711 Personal history of pulmonary embolism: Secondary | ICD-10-CM | POA: Insufficient documentation

## 2018-01-01 DIAGNOSIS — C187 Malignant neoplasm of sigmoid colon: Secondary | ICD-10-CM | POA: Diagnosis present

## 2018-01-01 DIAGNOSIS — R109 Unspecified abdominal pain: Secondary | ICD-10-CM | POA: Diagnosis not present

## 2018-01-01 DIAGNOSIS — R61 Generalized hyperhidrosis: Secondary | ICD-10-CM | POA: Diagnosis not present

## 2018-01-01 DIAGNOSIS — Z170 Estrogen receptor positive status [ER+]: Secondary | ICD-10-CM

## 2018-01-01 DIAGNOSIS — Z5111 Encounter for antineoplastic chemotherapy: Secondary | ICD-10-CM | POA: Diagnosis not present

## 2018-01-01 DIAGNOSIS — N951 Menopausal and female climacteric states: Secondary | ICD-10-CM

## 2018-01-01 LAB — CMP (CANCER CENTER ONLY)
ALBUMIN: 3.1 g/dL — AB (ref 3.5–5.0)
ALT: 101 U/L — AB (ref 0–55)
AST: 121 U/L — AB (ref 5–34)
Alkaline Phosphatase: 193 U/L — ABNORMAL HIGH (ref 40–150)
Anion gap: 7 (ref 3–11)
BUN: 11 mg/dL (ref 7–26)
CHLORIDE: 104 mmol/L (ref 98–109)
CO2: 26 mmol/L (ref 22–29)
Calcium: 10.3 mg/dL (ref 8.4–10.4)
Creatinine: 0.86 mg/dL (ref 0.60–1.10)
GFR, Est AFR Am: >60 mL/min (ref >60)
GFR, Estimated: >60 mL/min (ref >60)
Glucose, Bld: 98 mg/dL (ref 70–140)
POTASSIUM: 3.7 mmol/L (ref 3.5–5.1)
SODIUM: 137 mmol/L (ref 136–145)
Total Bilirubin: 0.9 mg/dL (ref 0.2–1.2)
Total Protein: 8.7 g/dL — ABNORMAL HIGH (ref 6.4–8.3)

## 2018-01-01 LAB — CEA (IN HOUSE-CHCC): CEA (CHCC-In House): 27.7 ng/mL — ABNORMAL HIGH (ref 0.00–5.00)

## 2018-01-01 LAB — CBC WITH DIFFERENTIAL (CANCER CENTER ONLY)
Basophils Absolute: 0.1 10*3/uL (ref 0.0–0.1)
Basophils Relative: 1 %
EOS PCT: 3 %
Eosinophils Absolute: 0.3 10*3/uL (ref 0.0–0.5)
HCT: 41.5 % (ref 34.8–46.6)
HEMOGLOBIN: 13.5 g/dL (ref 11.6–15.9)
LYMPHS ABS: 2.5 10*3/uL (ref 0.9–3.3)
LYMPHS PCT: 28 %
MCH: 33.4 pg (ref 25.1–34.0)
MCHC: 32.5 g/dL (ref 31.5–36.0)
MCV: 102.7 fL — AB (ref 79.5–101.0)
MONOS PCT: 10 %
Monocytes Absolute: 0.9 10*3/uL (ref 0.1–0.9)
Neutro Abs: 5.3 10*3/uL (ref 1.5–6.5)
Neutrophils Relative %: 58 %
PLATELETS: 299 10*3/uL (ref 145–400)
RBC: 4.04 MIL/uL (ref 3.70–5.45)
RDW: 16.8 % — ABNORMAL HIGH (ref 11.2–14.5)
WBC: 8.9 10*3/uL (ref 3.9–10.3)

## 2018-01-01 MED ORDER — SODIUM CHLORIDE 0.9% FLUSH
10.0000 mL | INTRAVENOUS | Status: DC | PRN
  Administered 2018-01-01: 10 mL via INTRAVENOUS
  Filled 2018-01-01: qty 10

## 2018-01-01 MED ORDER — ZOLPIDEM TARTRATE 5 MG PO TABS: 5.0000 mg | ORAL_TABLET | Freq: Every evening | ORAL | 0 refills | Status: DC | PRN

## 2018-01-01 MED ORDER — DEXAMETHASONE 4 MG PO TABS
4.0000 mg | ORAL_TABLET | Freq: Every day | ORAL | 0 refills | Status: DC
Start: 2018-01-01 — End: 2018-01-08

## 2018-01-01 MED ORDER — HEPARIN SOD (PORK) LOCK FLUSH 100 UNIT/ML IV SOLN
500.0000 [IU] | Freq: Once | INTRAVENOUS | Status: AC | PRN
  Administered 2018-01-01: 500 [IU] via INTRAVENOUS
  Filled 2018-01-01: qty 5

## 2018-01-01 NOTE — Progress Notes (Signed)
[place]  [phone] [phone]  Clinic Follow up Note   Patient Care Team: [name], MD as PCP - General (Internal Medicine) Leighton Ruff, MD as Consulting Physician (General Surgery) Carol Ada, MD as Consulting Physician (Gastroenterology) Concha Norway, MD (Inactive) as Consulting Physician (Internal Medicine) Emily Filbert, MD as Consulting Physician (Obstetrics and Gynecology) Tania Ade, RN as Registered Nurse (Medical Oncology) Stark Klein, MD as Consulting Physician (General Surgery) [name], MD as Consulting Physician (Hematology) 01/01/2018  SUMMARY OF ONCOLOGIC HISTORY: Oncology History   Breast cancer of lower-outer quadrant of right female breast [facility])   Staging form: Breast, AJCC 7th Edition     Clinical stage from 02/01/2015: Stage IA (T1a, N0, M0) - Signed by [name], MD on 06/04/2015     Pathologic stage from 11/10/2015: Stage IA (T1b, N0, M0) - Signed by [name], MD on 12/08/2015 Cancer of sigmoid colon [facility])   Staging form: Colon and Rectum, AJCC 7th Edition     Clinical: Stage I (T2, N0, M0) - Unsigned     Breast cancer of lower-outer quadrant of right female breast ([place])   2006 Cancer Diagnosis    Left breast DCIS diagnosed in 2006 and 2010, status post lumpectomy.      01/07/2014 Initial Diagnosis    Breast cancer      01/18/2015 Mammogram    21m mass in lower outer quadrant of right breast       02/01/2015 Initial Biopsy    (R) breast mass biopsy showed invasive ductal carcinoma & DCIS, grade 1-2.       02/01/2015 Receptors her2    ER 90%+, PR 10%+, Ki67 10%, HER2 negative (ratio 1.09)       11/07/2015 Surgery    (R) breast lumpectomy and sentinel lymph node biopsy ([name]      11/07/2015 Pathology Results    (R) breast lumpectomy showed invasive ductal carcinoma, grade 2, 1 cm, low-grade DCIS, negative margins, 3 sentinel lymph nodes were negative. LVI (-). HER2 repeated and remains  negative (ratio 1.29).        11/07/2015 Pathologic Stage    pT1b,pN0: Stage IA       11/07/2015 Oncotype testing    RS 22, which predicts a year risk of distant recurrence with tamoxifen alone 14% (intermediate-risk). Adjuvant chemo was not offered      02/21/2016 - 04/10/2016 Radiation Therapy    Adjuvant breast radiation. ([name]). Right breast: 50.4 Gy in 28 fractions.  Right breast boost: 12 Gy in 6 fractions.      05/2016 -  Anti-estrogen oral therapy    Femara (Letrozole) 2.5 mg daily.       10/17/2016 Mammogram    MM DIAG BREAST TOMO BILATERAL 10/17/2016 FINDINGS: There are bilateral lumpectomy changes in the upper central left breast and upper central right breast. No mass, nonsurgical distortion, or suspicious microcalcification is identified in either breast to suggest malignancy. Mammographic images were processed with CAD. IMPRESSION: No evidence of malignancy in either breast. Lumpectomy changes bilaterally. RECOMMENDATION: Diagnostic mammogram is suggested in 1 year. (Code:[id number])      12/16/2017 Mammogram    IMPRESSION: No mammographic evidence of malignancy in either breast, status post bilateral mastectomies. RECOMMENDATION: Diagnostic mammogram is suggested in 1 year.        Can[place] of sigmoid colon [place]   03/04/2014 Initial Diagnosis    Cancer of sigmoid colon      03/09/2014 Pathology Results    G1 invasive  adenocarcinoma, no lymph-Vascular invasion or Peri-neural invasion: Absent. MMR normal, MSI stable.       03/09/2014 Surgery    partial colectomy, negative margins.       11/10/2014 Relapse/Recurrence    biopsy confirmed oligo liver recurrence       11/10/2014 Imaging    PET showed two small ares of hypermetabolism in the right lobe of the liver, no other distant metastasis.       11/30/2014 Imaging    abdomen MRI showed a new enhancing lesion which corresponds to an area of abnormal hyper metabolism on recent PET-CT. Stable enlarged portal caval  lymph node.       01/02/2015 Pathology Results    Liver biopsy showed metastatic adenocarcinoma, IHC (+) for CK20, CDX-2, (-) for TTF-1 and ER      01/02/2015 Miscellaneous    liver biopsy KRAS mutation (+)       02/14/2015 Imaging    CT showed:  the small metastatic right hepatic lobe liver lesion is not identified for certain on this examination. No new lesions. Stable small cysts in segment 4 a.       03/05/2015 - 04/16/2015 Chemotherapy    CAPEOX (capecitabine 2500 mg twice daily Day 1-14, oxaliplatin 1 30 mg/m on day 1, every 21 days, S/P 3 cycles       08/24/2015 Procedure    CT-guided oligo liver metastasis microwave ablation by Dr. [name]      06/17/2016 Imaging    CT of the chest/abd/pelvis with contrast 1. Several new small pulmonary nodules worrisome for metastatic disease. 2. New large area of probable infiltrating recurrent tumor in the right hepatic lobe surrounding the prior ablation site. 3. Small supraclavicular lymph nodes.  Attention on future studies. 4. Stable surgical changes involving both breasts. No breast masses are identified. 5. Stable bilateral adrenal gland nodules. 6. Stable upper abdominal and right-sided retroperitoneal lymph nodes.      07/08/2016 PET scan    1. Large mass in the right lobe of the liver demonstrates diffuse hypermetabolism, compatible with recurrent metastatic disease in the liver 2. Multiple small pulmonary nodules are very similar to the recent Chest CT 06/17/16.  3. No other findings of metastatic disease elsewhere in the neck, chest, abdomen, or pelvis. 4. There is some low-level hypermetabolic activity adjacent to the surgical clips in the lateral aspect of the right breast at the site of prior lumpectomy.       07/29/2016 Pathology Results    Liver biopsy confirmed metastatic colon cancer       07/31/2016 - 07/31/2017 Chemotherapy    Chemotherapy FOLFIRI and Avastin (from cycle 2) and Leucovorin (added from cycle 8)  every 2 weeks, started on 07/31/2016, multiple delay per pt's request; chemo break from 5/30-6/21/18; decreased leucovorin to 2000 mg/m2, irinotecan to 150m/m2, 5-fu 20032mm2 from 03/05/17, due to poor tolerance. Started chemo break since 07/31/17.       10/23/2016 Imaging    CT ABDOMEN PELVIS W CONTRAST 10/24/16 IMPRESSION: 1. The numerous tiny bilateral pulmonary nodules seen on the previous imaging exams are no longer evident, suggesting response of therapy. 2. Large ill-defined irregular right hepatic lesion measures smaller on today's study. 3. Slight increase in size of hepatoduodenal ligament lymphadenopathy. 4. Abdominal Aortic Atherosclerois (ICD10-170.0)      01/05/2017 Imaging    CT CAP IMPRESSION: 1. Left lower lobe pulmonary nodule and hepatic metastatic disease are stable. 2. Hepatoduodenal ligament lymph node is stable. 3. Aortic atherosclerosis (ICD10-170.0). Coronary artery calcification.  4. Enlarged pulmonary arteries, indicative of pulmonary arterial hypertension. 5. Small bilateral adrenal nodules are unchanged.      05/09/2017 Imaging    CT CAP IMPRESSION: 1. Stable appearance left lower lobe pulmonary nodule with an apparent new tiny posterior right upper lobe pulmonary nodule. 2. Dominant ill-defined right hepatic mass measures smaller on today's study. 3. Slight decrease in the hepatoduodenal ligament lymph node. 4. Aortic Atherosclerois (ICD10-170.0)      05/09/2017 Imaging    CT CAP 05/09/17 IMPRESSION: 1. Stable appearance left lower lobe pulmonary nodule with an apparent new tiny posterior right upper lobe pulmonary nodule. Continued attention on follow-up recommended. 2. Dominant ill-defined right hepatic mass measures smaller on today's study. 3. Slight decrease in the hepatoduodenal ligament lymph node. 4.  Aortic Atherosclerois (ICD10-170.0)       08/11/2017 Imaging    CT CAP W Contrast 08/11/17 IMPRESSION: 1. Essentially stable appearance of  small pulmonary nodules, hypodense mass laterally in the right hepatic lobe, and mild portacaval adenopathy. No new or progressive lesions. 2. Other imaging findings of potential clinical significance: Aortic Atherosclerosis (ICD10-I70.0). Lower lumbar impingement due to spondylosis and degenerative disc disease. Uterine fibroids. Bilateral renal cysts. Coronary atherosclerosis. Mild mitral valve calcification. Mild cardiomegaly.        09/24/2017 -  Chemotherapy    Maintenance Xeloda 2500 mg twice daily days 1 through 14 of each cycle of treatment and Avastin given every 3 weeks starting on 09/24/2017.       12/02/2017 Imaging    CT CAP W Contrast 12/02/17 IMPRESSION: Chest Impression: 1. Interval increase in size of bilateral small pulmonary nodules. 2. No mediastinal adenopathy  Abdomen / Pelvis Impression: 1. Dominant lesion in the RIGHT hepatic lobe is similar; however there are 2 adjacent low-density lesions in the RIGHT hepatic lobe which are not appreciated on comparison exam and concerning recurrent hepatic metastasis. 2. Interval increase in size periaortic retroperitoneal lymph nodes consistent with recurrent metastatic adenopathy.     CURRENT TREATMENT:   1. Letrozole 2.5 mg once daily, started in September 2017 2. Maintenance Xeloda2500 mg twice daily days 1 through 14 of each cycle of treatmentand Avastin given every 3 weeks starting on 09/24/2017. Plan to stop on 12/18/17.  3. PENDING Irinotecan plus Avastin every 2 weeks started on 01/08/18   INTERVAL HISTORY: Ms. Ardila returns for follow-up as scheduled prior to beginning Irinotecan/Avastin.  She developed moderate fatigue while on Xeloda which she completed on 12/18/2017.  She delayed starting new treatment last week so she could go to the [facility] with her husband, then developed severe fatigue while on the trip.  Had to drive back instead of fly so she could sleep the whole way home.  Fatigue has not  improved; she gets up in the morning, walks to the mailbox and requires several hours rest.  She has difficulty caring for her grandchildren which makes her very upset and she is tearful today, asking to delay chemo 1 week. Otherwise she is doing well, eating and drinking without difficulty. No mucositis, BM regular. No fever, chills, cough, dyspnea, or neuropathy. Right flank pain is stable on tylenol #4, she ran out for 2 days and pain increased to 8/10, otherwise controlled around 3/10. Hot flashes are frequent but stable.   REVIEW OF SYSTEMS:   Constitutional: Denies fevers, chills or abnormal weight loss (+) hot flash/night sweats, stable (+) good appetite (+) severe fatigue with activity intolerance  Eyes: Denies blurriness of vision Ears, nose, mouth, throat, and  face: Denies mucositis or sore throat Respiratory: Denies cough, dyspnea or wheezes Cardiovascular: Denies palpitation, chest discomfort or lower extremity swelling Gastrointestinal:  Denies nausea, constipation, diarrhea, heartburn or change in bowel habits (+) emesis x1 in 2 weeks diet-related (+) right flank pain, chronic; stable  Skin: Denies abnormal skin rashes (+) mild redness to hands, no sensitivity  Lymphatics: Denies new lymphadenopathy or easy bruising Neurological:Denies numbness, tingling or new weaknesses Behavioral/Psych: Mood is stable, no new changes (+) tearful  All other systems were reviewed with the patient and are negative.  MEDICAL HISTORY:  Past Medical History:  Diagnosis Date  . Anxiety   . Breast CA ([place])    radiation and surgery-lt.  . Cancer ([place])    left breast cancer x2  . Hypertension   . Liver lesion   . Personal history of chemotherapy   . Personal history of radiation therapy 2017  . Pulmonary embolism ([place])    "blood clot in lungs" -dx. 4'15 with CT Chest. On Xarelto  . Radiation 01/22/10-03/12/10   left whole breast 4500 cGy, upper aspect boosted to 6120 cGy  . Radiation 02/21/16 -  04/10/16   right breast 50.4 Gy, right breast boost 12 Gy  . Shortness of breath    sob with exertion. dx. with Pulmonary emboli 4'15 ,tx. Xarelto,Lovenox used for [employer].    SURGICAL HISTORY: Past Surgical History:  Procedure Laterality Date  . BREAST BIOPSY Left 2010   malignant  . BREAST BIOPSY Right 01/2015   malignant  . BREAST LUMPECTOMY Left 2006  . BREAST LUMPECTOMY Left 2010  . BREAST LUMPECTOMY Right 11/07/2015  . BREAST LUMPECTOMY WITH RADIOACTIVE SEED AND SENTINEL LYMPH NODE BIOPSY Right 11/07/2015   Procedure: BREAST LUMPECTOMY WITH RADIOACTIVE SEED AND SENTINEL LYMPH NODE BIOPSY;  Surgeon: Stark Klein, MD;  Location: [facility];  Service: General;  Laterality: Right;  . BREAST SURGERY     '11- Left breast lumpectomy  . CESAREAN SECTION    . CHOLECYSTECTOMY     Laparoscopic 20 yrs ago  . COLON SURGERY  03-09-14   partial colectomy for cancer  . IR GENERIC HISTORICAL  07/29/2016   IR FLUORO GUIDE PORT INSERTION RIGHT 07/29/2016 Sandi Mariscal, MD [facility]  . IR GENERIC HISTORICAL  07/29/2016   IR US GUIDE VASC ACCESS RIGHT 07/29/2016 Sandi Mariscal, MD [facility]  . IR GENERIC HISTORICAL  07/29/2016   IR US GUIDE BX ASP/DRAIN 07/29/2016 [facility]  . PARTIAL COLECTOMY N/A 03/09/2014   Procedure: LAPAROSCOPIC ASSISTED PARTIAL COLECTOMY, splenic flexure mobilization;  Surgeon: Leighton Ruff, MD;  Location: [facility];  Service: General;  Laterality: N/A;  . RADIOFREQUENCY ABLATION Right 08/24/2015   Procedure: MICROWAVE ABLATION RIGHT LIVER LOBE ;  Surgeon: Corrie Mckusick, DO;  Location: [facility];  Service: Anesthesiology;  Laterality: Right;    I have reviewed the social history and family history with the patient and they are unchanged from previous note.  ALLERGIES:  is allergic to tramadol.  MEDICATIONS:  Current Outpatient Medications  Medication Sig Dispense Refill  . acetaminophen-codeine (TYLENOL #4) 300-60 MG tablet Take 1 tablet by mouth every 8  (eight) hours as needed. for pain 40 tablet 0  . amLODipine (NORVASC) 5 MG tablet Take 1 tablet (5 mg total) by mouth daily. 30 tablet 0  . dexamethasone (DECADRON) 4 MG tablet Take 1 tablet (4 mg total) by mouth daily. 5 tablet 0  . hydrochlorothiazide (HYDRODIURIL) 25 MG tablet Take 1 tablet (25 mg total) by mouth  every morning. 30 tablet 1  . letrozole (FEMARA) 2.5 MG tablet Take 1 tablet (2.5 mg total) by mouth daily. 30 tablet 3  . lidocaine-prilocaine (EMLA) cream Apply 1 application topically as needed. 30 g 2  . metoprolol tartrate (LOPRESSOR) 50 MG tablet Take 1 tablet (50 mg total) by mouth 2 (two) times daily. 60 tablet 2  . ondansetron (ZOFRAN) 8 MG tablet Take 1 tablet (8 mg total) by mouth 2 (two) times daily as needed for refractory nausea / vomiting. Start on day 3 after chemotherapy. 30 tablet 1  . potassium chloride SA (K-DUR,KLOR-CON) 20 MEQ tablet TAKE 1 TABLET BY MOUTH 2 TIMES DAILY 60 tablet 2  . prochlorperazine (COMPAZINE) 10 MG tablet Take 1 tablet (10 mg total) by mouth every 6 (six) hours as needed (NAUSEA). 30 tablet 2  . promethazine (PHENERGAN) 25 MG tablet Take 1 tablet (25 mg total) by mouth every 6 (six) hours as needed for nausea or vomiting. 30 tablet 2  . rivaroxaban (XARELTO) 20 MG TABS tablet Take 1 tablet (20 mg total) by mouth daily. 30 tablet 3  . zolpidem (AMBIEN) 5 MG tablet Take 1 tablet (5 mg total) by mouth at bedtime as needed for sleep. 20 tablet 0  . ferrous sulfate 325 (65 FE) MG tablet Take 325 mg by mouth daily with breakfast. Pt takes  4 times per week.    Marland Kitchen LORazepam (ATIVAN) 0.5 MG tablet Take 1 tablet (0.5 mg total) by mouth every 8 (eight) hours. 30 tablet 0  . magic mouthwash w/lidocaine SOLN Take 5 mLs by mouth 4 (four) times daily as needed for mouth pain. 240 mL 1   Current Facility-Administered Medications  Medication Dose Route Frequency Provider Last Rate Last Dose  . sodium chloride flush (NS) 0.9 % injection 10 mL  10 mL Intravenous  PRN [name], MD   10 mL at 01/01/18 1037    PHYSICAL EXAMINATION: ECOG PERFORMANCE STATUS: 3 - Symptomatic, >50% confined to bed  Vitals:   01/01/18 0941  BP: (!) 146/91  Pulse: 64  Resp: 20  Temp: 98.4 F (36.9 C)  SpO2: 100%   Filed Weights   01/01/18 0941  Weight: 275 lb 1.6 oz (124.8 kg)    GENERAL:alert, no distress and comfortable SKIN: skin color, texture, turgor are normal, no rashes or significant lesions (+) mild palmar erythema  EYES: normal, Conjunctiva are pink and non-injected, sclera clear OROPHARYNX:no exudate, no erythema and lips, buccal mucosa, and tongue normal  LYMPH:  no palpable cervical, supraclavicular, or axillary lymphadenopathy LUNGS: clear to auscultation with normal breathing effort HEART: regular rate & rhythm and no murmurs and no lower extremity edema ABDOMEN:abdomen soft, non-tender and normal bowel sounds Musculoskeletal:no cyanosis of digits and no clubbing  NEURO: alert & oriented x 3 with fluent speech, no focal motor/sensory deficits PAC without erythema   LABORATORY DATA:  I have reviewed the data as listed CBC Latest Ref Rng & Units 01/01/2018 12/04/2017 11/13/2017  WBC 3.9 - 10.3 K/uL 8.9 7.7 8.8  Hemoglobin 11.6 - 15.9 g/dL - - -  Hematocrit 34.8 - 46.6 % 41.5 39.5 41.3  Platelets 145 - 400 K/uL 299 270 275     CMP Latest Ref Rng & Units 01/01/2018 12/04/2017 11/13/2017  Glucose 70 - 140 mg/dL 98 98 96  BUN 7 - 26 mg/dL _0 Creatinine 0.60 - 1.10 mg/dL 0.86 0.82 0.90  Sodium 136 - 145 mmol/L 137 138 140  Potassium 3.5 - 5.1  mmol/L 3.7 3.3(L) 3.8  Chloride 98 - 109 mmol/L 104 103 103  CO2 22 - 29 mmol/L _0 Calcium 8.4 - 10.4 mg/dL 10.3 10.0 10.4  Total Protein 6.4 - 8.3 g/dL 8.7(H) 8.2 8.5(H)  Total Bilirubin 0.2 - 1.2 mg/dL 0.9 0.9 0.7  Alkaline Phos 40 - 150 U/L 193(H) 123 127  AST 5 - 34 U/L 121(H) 62(H) 74(H)  ALT 0 - 55 U/L 101(H) 48 58(H)   CEA  01/18/2014: 0.8 03/01/2015: 1.3 01/31/2016: 4.8 05/28/2016:  48 07/31/2016: 173.7 08/28/16: 139 09/25/2016: 43 11/04/16: 9.94 12/18/2016: 6.99 01/29/17: 4.58 03/04/17: 6.09 04/09/17: 4.22 05/07/17: 5.29 06/04/17: 4.12 07/02/17: 6.78 07/30/17 : 5.35  09/24/17: 5.41 10/23/17: 8.03 11/13/17: 12.32 01/01/18: PENDING   PATHOLOGY RESULT Diagnosis 01/02/2015 Liver, needle/core biopsy, right - POSITIVE FOR METASTATIC ADENOCARCINOMA. - SEE COMMENT.   Diagnosis 11/07/2015 1. Breast, lumpectomy, Right - INVASIVE DUCTAL CARCINOMA, GRADE 2, SPANNING 1 CM. - DUCTAL CARCINOMA IN SITU, LOW GRADE. - RESECTION MARGINS ARE NEGATIVE FOR INVASIVE CARCINOMA. - DUCTAL CARCINOMA IN SITU COMES TO WITHIN 0.3 CM OF THE POSTERIOR MARGIN. - BIOPSY SITE. - SEE ONCOLOGY TABLE. 2. Lymph node, sentinel, biopsy, right axillary #1 - ONE OF ONE LYMPH NODES NEGATIVE FOR CARCINOMA (0/1). 3. Lymph node, sentinel, biopsy, right axillary #2 - ONE OF ONE LYMPH NODES NEGATIVE FOR CARCINOMA (0/1). 4. Lymph node, sentinel, biopsy, right axillary #3 - ONE OF ONE LYMPH NODES NEGATIVE FOR CARCINOMA (0/1).  ONCOTYPE DX: RS 12, which predicts a year risk of distant recurrence with tamoxifen alone 14%   Diagnosis 07/29/2016 Liver, needle/core biopsy, posterior of right lobe METASTATIC ADENOCARCINOMA, CONSISTENT WITH COLONIC PRIMARY.   RADIOGRAPHIC STUDIES: I have personally reviewed the radiological images as listed and agreed with the findings in the report. No results found.   ASSESSMENT & PLAN: Veronica Reyes 62 y.o. female   1. Colon Cancer, Stage I (pT2N0), MMR normal, oligo recurrent disease in liver in 12/2014, KRAS mutation (+), liver and lung recurrence in 06/2016 -Veronica Reyes appears stable. She completed final cycle Xeldoa on 12/18/17, she experienced severe fatigue with the last cycle, but overall tolerated course well. She has not recovered and is requesting further treatment delay. I recommend short course of steroids to improve her energy level,  prescribed decadron 4 mg daily x3-5 days. Will return in 1 week for f/u with NP and begin irinotecan/avastin. In the mean time I encouraged her to increase activity, eat well, and hydrate adequately.  -Her fatigue has made her depressed and tearful, she keeps a good attitude thinking about the joy her grandchildren bring her. Declines SW or anti-depressant today. -Labs reviewed, CBC unremarkable; CMP with moderate transaminitis, possibly related to recent [facility] and hepatic metastases. Bili is normal.  Will monitor closely.   2. Right breast cancer, pT1bN0M0, stage Ia, ER+/PR+/HER2-, History of left Breast Cancer, diagnosed on 02/01/2015 -No mammographic evidence of malignancy in either breast on 12/16/17, repeat in 1 year -Continue daily letrozole -She has frequent but stable hot flashes/night sweats, I dicussed potential SSRI for vasomotor symptoms and reviewed possible side effects, she declined today.    3. H/o PE (01/06/14) -Compliant with Xarelto  4. Uterine fibroids  5. Kidney lesion (right)  6. Right flank pain, knee discomfort -Controlled on Tylenol #4. She ran out lately and took increased amounts of acetaminophen, I encouraged her to limit ES tylenol and use prescription only as prescribed.   7. Morbid obesity -Patient requested referral to dietician to discuss weight  management and healthy food choices, I referred her today.  8. HTN -On amlodipine, metoprolol, and HCTZ, BP remains elevated. She has appointment with new PCP Dr. Ola Spurr on 02/03/18.   9. Hypokalemia -K 3.7, normal on 1 tab BID; will continue  PLAN -Labs reviewed, monitoring transaminitis  -Hold chemo for significant fatigue, per patient's request  -Prescription: Decadron 4 mg daily x3-5 days for fatigue -Lab, f/u in 1 week to monitor LFTs and status, begin irinotecan/avastin -Referral to dietician  -Refilled ambien   Orders Placed This Encounter  Procedures  . Ambulatory referral to Nutrition and  Diabetic E    Referral Priority:   Routine    Referral Type:   Consultation    Referral Reason:   Specialty Services Required    Number of Visits Requested:   1   All questions were answered. The patient knows to call the clinic with any problems, questions or concerns. No barriers to learning was detected. I spent 20 minutes counseling the patient face to face. The total time spent in the appointment was 25 minutes and more than 50% was on counseling and review of test results     Alla Feeling, NP 01/01/18

## 2018-01-01 NOTE — Telephone Encounter (Signed)
Left message on voicemail for patient regarding appointments

## 2018-01-01 NOTE — Telephone Encounter (Signed)
Appointments scheduled per 4/19 los

## 2018-01-04 ENCOUNTER — Other Ambulatory Visit: Payer: Self-pay | Admitting: Hematology

## 2018-01-08 ENCOUNTER — Other Ambulatory Visit: Payer: BLUE CROSS/BLUE SHIELD

## 2018-01-08 ENCOUNTER — Inpatient Hospital Stay: Payer: BLUE CROSS/BLUE SHIELD

## 2018-01-08 ENCOUNTER — Encounter: Payer: Self-pay | Admitting: Oncology

## 2018-01-08 ENCOUNTER — Inpatient Hospital Stay (HOSPITAL_BASED_OUTPATIENT_CLINIC_OR_DEPARTMENT_OTHER): Payer: BLUE CROSS/BLUE SHIELD | Admitting: Oncology

## 2018-01-08 DIAGNOSIS — C787 Secondary malignant neoplasm of liver and intrahepatic bile duct: Secondary | ICD-10-CM

## 2018-01-08 DIAGNOSIS — R109 Unspecified abdominal pain: Secondary | ICD-10-CM | POA: Diagnosis not present

## 2018-01-08 DIAGNOSIS — I10 Essential (primary) hypertension: Secondary | ICD-10-CM | POA: Diagnosis not present

## 2018-01-08 DIAGNOSIS — C7800 Secondary malignant neoplasm of unspecified lung: Secondary | ICD-10-CM

## 2018-01-08 DIAGNOSIS — C50511 Malignant neoplasm of lower-outer quadrant of right female breast: Secondary | ICD-10-CM

## 2018-01-08 DIAGNOSIS — Z5111 Encounter for antineoplastic chemotherapy: Secondary | ICD-10-CM | POA: Insufficient documentation

## 2018-01-08 DIAGNOSIS — C187 Malignant neoplasm of sigmoid colon: Secondary | ICD-10-CM

## 2018-01-08 DIAGNOSIS — R5383 Other fatigue: Secondary | ICD-10-CM

## 2018-01-08 DIAGNOSIS — E876 Hypokalemia: Secondary | ICD-10-CM | POA: Insufficient documentation

## 2018-01-08 DIAGNOSIS — Z86711 Personal history of pulmonary embolism: Secondary | ICD-10-CM | POA: Diagnosis not present

## 2018-01-08 LAB — CMP (CANCER CENTER ONLY)
ALBUMIN: 3 g/dL — AB (ref 3.5–5.0)
ALK PHOS: 196 U/L — AB (ref 40–150)
ALT: 108 U/L — ABNORMAL HIGH (ref 0–55)
AST: 106 U/L — AB (ref 5–34)
Anion gap: 10 (ref 3–11)
BILIRUBIN TOTAL: 0.7 mg/dL (ref 0.2–1.2)
BUN: 18 mg/dL (ref 7–26)
CALCIUM: 9.8 mg/dL (ref 8.4–10.4)
CO2: 23 mmol/L (ref 22–29)
CREATININE: 0.79 mg/dL (ref 0.60–1.10)
Chloride: 103 mmol/L (ref 98–109)
GFR, Est AFR Am: >60 mL/min (ref >60)
GLUCOSE: 127 mg/dL (ref 70–140)
Potassium: 3.2 mmol/L — ABNORMAL LOW (ref 3.5–5.1)
Sodium: 136 mmol/L (ref 136–145)
TOTAL PROTEIN: 8.6 g/dL — AB (ref 6.4–8.3)

## 2018-01-08 LAB — CBC WITH DIFFERENTIAL (CANCER CENTER ONLY)
BASOS ABS: 0.1 10*3/uL (ref 0.0–0.1)
BASOS PCT: 0 %
EOS ABS: 0 10*3/uL (ref 0.0–0.5)
EOS PCT: 0 %
HCT: 42.2 % (ref 34.8–46.6)
Hemoglobin: 14.2 g/dL (ref 11.6–15.9)
Lymphocytes Relative: 20 %
Lymphs Abs: 2.4 10*3/uL (ref 0.9–3.3)
MCH: 33.5 pg (ref 25.1–34.0)
MCHC: 33.6 g/dL (ref 31.5–36.0)
MCV: 99.6 fL (ref 79.5–101.0)
Monocytes Absolute: 1.1 10*3/uL — ABNORMAL HIGH (ref 0.1–0.9)
Monocytes Relative: 9 %
Neutro Abs: 8.8 10*3/uL — ABNORMAL HIGH (ref 1.5–6.5)
Neutrophils Relative %: 71 %
PLATELETS: 307 10*3/uL (ref 145–400)
RBC: 4.24 MIL/uL (ref 3.70–5.45)
RDW: 17.6 % — AB (ref 11.2–14.5)
WBC Count: 12.3 10*3/uL — ABNORMAL HIGH (ref 3.9–10.3)

## 2018-01-08 MED ORDER — PROCHLORPERAZINE MALEATE 10 MG PO TABS
10.0000 mg | ORAL_TABLET | Freq: Once | ORAL | Status: AC | PRN
  Administered 2018-01-08: 10 mg via ORAL

## 2018-01-08 MED ORDER — ATROPINE SULFATE 1 MG/ML IJ SOLN
0.5000 mg | Freq: Once | INTRAMUSCULAR | Status: AC | PRN
  Administered 2018-01-08: 0.5 mg via INTRAVENOUS

## 2018-01-08 MED ORDER — PROCHLORPERAZINE MALEATE 10 MG PO TABS
ORAL_TABLET | ORAL | Status: AC
  Filled 2018-01-08: qty 1

## 2018-01-08 MED ORDER — IRINOTECAN HCL CHEMO INJECTION 100 MG/5ML
140.0000 mg/m2 | Freq: Once | INTRAVENOUS | Status: AC
  Administered 2018-01-08: 340 mg via INTRAVENOUS
  Filled 2018-01-08: qty 17

## 2018-01-08 MED ORDER — PALONOSETRON HCL INJECTION 0.25 MG/5ML
0.2500 mg | Freq: Once | INTRAVENOUS | Status: AC
  Administered 2018-01-08: 0.25 mg via INTRAVENOUS

## 2018-01-08 MED ORDER — SODIUM CHLORIDE 0.9 % IV SOLN
Freq: Once | INTRAVENOUS | Status: AC
  Administered 2018-01-08: 12:00:00 via INTRAVENOUS
  Filled 2018-01-08: qty 5

## 2018-01-08 MED ORDER — ACETAMINOPHEN-CODEINE #4 300-60 MG PO TABS: 1.0000 | ORAL_TABLET | Freq: Three times a day (TID) | ORAL | 0 refills | Status: DC | PRN

## 2018-01-08 MED ORDER — PALONOSETRON HCL INJECTION 0.25 MG/5ML
INTRAVENOUS | Status: AC
  Filled 2018-01-08: qty 5

## 2018-01-08 MED ORDER — ATROPINE SULFATE 1 MG/ML IJ SOLN
INTRAMUSCULAR | Status: AC
  Filled 2018-01-08: qty 1

## 2018-01-08 MED ORDER — DEXAMETHASONE 4 MG PO TABS: 4.0000 mg | ORAL_TABLET | Freq: Every day | ORAL | 0 refills | Status: DC

## 2018-01-08 MED ORDER — SODIUM CHLORIDE 0.9 % IJ SOLN
10.0000 mL | Freq: Once | INTRAMUSCULAR | Status: AC
  Administered 2018-01-08: 10 mL via INTRAVENOUS
  Filled 2018-01-08: qty 10

## 2018-01-08 MED ORDER — SODIUM CHLORIDE 0.9 % IV SOLN
600.0000 mg | Freq: Once | INTRAVENOUS | Status: AC
  Administered 2018-01-08: 600 mg via INTRAVENOUS
  Filled 2018-01-08: qty 16

## 2018-01-08 MED ORDER — HEPARIN SOD (PORK) LOCK FLUSH 100 UNIT/ML IV SOLN
500.0000 [IU] | Freq: Once | INTRAVENOUS | Status: AC | PRN
  Administered 2018-01-08: 500 [IU]
  Filled 2018-01-08: qty 5

## 2018-01-08 MED ORDER — SODIUM CHLORIDE 0.9% FLUSH
10.0000 mL | INTRAVENOUS | Status: DC | PRN
Start: 2018-01-08 — End: 2018-01-08
  Administered 2018-01-08: 10 mL
  Filled 2018-01-08: qty 10

## 2018-01-08 MED ORDER — SODIUM CHLORIDE 0.9 % IV SOLN
Freq: Once | INTRAVENOUS | Status: AC
  Administered 2018-01-08: 11:00:00 via INTRAVENOUS

## 2018-01-08 NOTE — Progress Notes (Signed)
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, estrogen receptor positive ([place]) 2) Colon Cancer, Stage I (pT2N0), MMR normal, oligo recurrent disease in liver in 12/2014, KRAS mutation (+), liver and lung recurrence in 06/2016  PRIOR THERAPY: 1) status post lumpectomy in 2016 and 2011; radiation in 2011   2) CAPEOX 03/05/2015 through 04/16/2015 3) Chemotherapy FOLFIRI and Avastin (from cycle 2)andLeucovorin(added from cycle 8) every 2 weeks, started on 07/31/2016, multiple delay per pt's request; chemo break from 5/30-6/21/18; decreasedleucovorin to 2000 mg/m2,irinotecan to 182m/m2, 5-fu 20085mm2 from 03/05/17, due to poor tolerance.Started chemo break since 07/31/17. 4) Xeloda 2500 mg twice daily days 1 through 14 of each cycle of treatment and Avastin given every 3 weeks.  First dose 09/24/2017.  Stopped on 12/18/2017 due to disease progression.  CURRENT THERAPY: 1. Letrozole 2.5 mg once daily, started in September 2017  2. Irinotecan plus Avastin every 2 weeks.  First dose started on 01/08/18  INTERVAL HISTORY: Veronica [age]ALL62.o. female returns for routine follow-up visit by herself.  The patient reports that she is feeling better this week.  She states that the dexamethasone really helped her energy level.  She would like to get another 5 days of this medication.  Patient reports that her right flank pain has worsened slightly.  She is using Tylenol before about 3 times a day.  She requests a refill of this.  Denies fevers and chills.  Denies chest pain, shortness breath, cough, hemoptysis.  Denies nausea, vomiting, constipation, diarrhea.  Has recent weight loss or night sweats.  Patient states that her husband is due to have a left lobectomy next week due to a new diagnosis of lung cancer.  The patient is  here for evaluation prior to cycle 1 of Irinotecan plus Avastin.  MEDICAL HISTORY: Past Medical History:  Diagnosis Date  . Anxiety   . [place] ([facility]   rad[place] and [place]-lt.  . [place]   left breast cancer x2  . Hypertension   . Liver lesion   . Personal history of chemotherapy   . Personal history of radiation therapy 2017  . [facility]   "blood clot in lungs" -dx. 4'15 with CT Chest. On Xarelto  . Radiation 01/22/10-03/12/10   left whole breast 4500 cGy, upper aspect boosted to 6120 cGy  . Radiation 02/21/16 - 04/10/16   right breast 50.4 Gy, right breast boost 12 Gy  . Shortness of breath    sob with exertion. dx. with Pulmonary emboli 4'15 ,tx. Xarelto,Lovenox used for [employer]   ALLERGIES:  is allergic to tramadol.  MEDICATIONS:  Current Outpatient Medications  Medication Sig Dispense Refill  . acetaminophen-codeine (TYLENOL #4) 300-60 MG tablet Take 1 tablet by mouth every 8 (eight) hours as needed. for pain 40 tablet 0  . amLODipine (NORVASC) 5 MG tablet Take 1 tablet (5 mg total) by mouth daily. 30 tablet 0  . dexamethasone (DECADRON) 4 MG tablet Take 1 tablet (4 mg total) by mouth daily. 5 tablet 0  . ferrous sulfate 325 (65 FE) MG tablet Take 325 mg by mouth daily with breakfast. Pt takes  4 times per week.    . hydrochlorothiazide (HYDRODIURIL) 25 MG tablet Take 1 tablet (25 mg total) by mouth every morning. 30 tablet 1  . letrozole (FEMARA) 2.5 MG  tablet Take 1 tablet (2.5 mg total) by mouth daily. 30 tablet 3  . lidocaine-prilocaine (EMLA) cream Apply 1 application topically as needed. 30 g 2  . LORazepam (ATIVAN) 0.5 MG tablet Take 1 tablet (0.5 mg total) by mouth every 8 (eight) hours. 30 tablet 0  . magic mouthwash w/lidocaine SOLN Take 5 mLs by mouth 4 (four) times daily as needed for mouth pain. 240 mL 1  . metoprolol tartrate (LOPRESSOR) 50 MG tablet Take 1 tablet (50 mg total) by mouth 2 (two) times daily. 60 tablet 2  . ondansetron  (ZOFRAN) 8 MG tablet Take 1 tablet (8 mg total) by mouth 2 (two) times daily as needed for refractory nausea / vomiting. Start on day 3 after chemotherapy. 30 tablet 1  . potassium chloride SA (K-DUR,KLOR-CON) 20 MEQ tablet TAKE 1 TABLET BY MOUTH 2 TIMES DAILY 60 tablet 2  . prochlorperazine (COMPAZINE) 10 MG tablet Take 1 tablet (10 mg total) by mouth every 6 (six) hours as needed (NAUSEA). 30 tablet 2  . promethazine (PHENERGAN) 25 MG tablet Take 1 tablet (25 mg total) by mouth every 6 (six) hours as needed for nausea or vomiting. 30 tablet 2  . rivaroxaban (XARELTO) 20 MG TABS tablet Take 1 tablet (20 mg total) by mouth daily. 30 tablet 3  . zolpidem (AMBIEN) 5 MG tablet Take 1 tablet (5 mg total) by mouth at bedtime as needed for sleep. 20 tablet 0   No current facility-administered medications for this visit.     SURGICAL HISTORY:  Past Surgical History:  Procedure Laterality Date  . BREAST BIOPSY Left 2010   malignant  . BREAST BIOPSY Right 01/2015   malignant  . BREAST LUMPECTOMY Left 2006  . BREAST LUMPECTOMY Left 2010  . BREAST LUMPECTOMY Right 11/07/2015  . BREAST LUMPECTOMY WITH RADIOACTIVE SEED AND SENTINEL LYMPH NODE BIOPSY Right 11/07/2015   Procedure: BREAST LUMPECTOMY WITH RADIOACTIVE SEED AND SENTINEL LYMPH NODE BIOPSY;  Surgeon: Stark Klein, MD;  Location: [facility];  Service: General;  Laterality: Right;  . BREAST SURGERY     '11- Left breast lumpectomy  . CESAREAN SECTION    . CHOLECYSTECTOMY     Laparoscopic 20 yrs ago  . COLON SURGERY  03-09-14   partial colectomy for cancer  . IR GENERIC HISTORICAL  07/29/2016   IR FLUORO GUIDE PORT INSERTION RIGHT 07/29/2016 Sandi Mariscal, MD [facility]  . IR GENERIC HISTORICAL  07/29/2016   IR US GUIDE VASC ACCESS RIGHT 07/29/2016 Sandi Mariscal, MD [facility]  . IR GENERIC HISTORICAL  07/29/2016   IR US GUIDE BX ASP/DRAIN 07/29/2016 [facility]  . PARTIAL COLECTOMY N/A 03/09/2014   Procedure: LAPAROSCOPIC  ASSISTED PARTIAL COLECTOMY, splenic flexure mobilization;  Surgeon: Leighton Ruff, MD;  Location: [facility];  Service: General;  Laterality: N/A;  . RADIOFREQUENCY ABLATION Right 08/24/2015   Procedure: MICROWAVE ABLATION RIGHT LIVER LOBE ;  Surgeon: Corrie Mckusick, DO;  Location: [facility];  Service: Anesthesiology;  Laterality: Right;    REVIEW OF SYSTEMS:   Review of Systems  Constitutional: Negative for appetite change, chills, fatigue, fever and unexpected weight change.  HENT:   Negative for mouth sores, nosebleeds, sore throat and trouble swallowing.   Eyes: Negative for eye problems and icterus.  Respiratory: Negative for cough, hemoptysis, shortness of breath and wheezing.   Cardiovascular: Negative for chest pain and leg swelling.  Gastrointestinal: Negative for abdominal pain, constipation, diarrhea, nausea and vomiting.  Genitourinary: Negative for bladder incontinence, difficulty urinating,  dysuria, frequency and hematuria.   Musculoskeletal: Negative for gait problem, neck pain and neck stiffness.  Positive for right-sided flank pain.  Skin: Negative for itching and rash.  Neurological: Negative for dizziness, extremity weakness, gait problem, headaches, light-headedness and seizures.  Hematological: Negative for adenopathy. Does not bruise/bleed easily.  Psychiatric/Behavioral: Negative for confusion, depression and sleep disturbance. The patient is not nervous/anxious.     PHYSICAL EXAMINATION:  Blood pressure (!) 154/95, pulse 68, temperature 98.8 F (37.1 C), temperature source Oral, resp. rate 18, height '5\' 6"'  (1.676 m), weight 274 lb 11.2 oz (124.6 kg), last menstrual period 01/23/2014, SpO2 96 %.  ECOG PERFORMANCE STATUS: 1 - Symptomatic but completely ambulatory  Physical Exam  Constitutional: Oriented to person, place, and time and well-developed, well-nourished, and in no distress. No distress.  HENT:  Head: Normocephalic and atraumatic.  Mouth/Throat: Oropharynx is  clear and moist. No oropharyngeal exudate.  Eyes: Conjunctivae are normal. Right eye exhibits no discharge. Left eye exhibits no discharge. No scleral icterus.  Neck: Normal range of motion. Neck supple.  Cardiovascular: Normal rate, regular rhythm, normal heart sounds and intact distal pulses.   Pulmonary/Chest: Effort normal and breath sounds normal. No respiratory distress. No wheezes. No rales.  Abdominal: Soft. Bowel sounds are normal. Exhibits no distension and no mass. There is no tenderness.  Musculoskeletal: Normal range of motion. Exhibits no edema.  Lymphadenopathy:    No cervical adenopathy.  Neurological: Alert and oriented to person, place, and time. Exhibits normal muscle tone. Gait normal. Coordination normal.  Skin: Skin is warm and dry. No rash noted. Not diaphoretic. No erythema. No pallor.  Psychiatric: Mood, memory and judgment normal.  Vitals reviewed.  LABORATORY DATA: Lab Results  Component Value Date   WBC 12.3 (H) 01/08/2018   HGB 14.2 01/08/2018   HCT 42.2 01/08/2018   MCV 99.6 01/08/2018   PLT 307 01/08/2018      Chemistry      Component Value Date/Time   NA 137 01/01/2018 0921   NA 141 08/13/2017 1025   K 3.7 01/01/2018 0921   K 3.7 08/13/2017 1025   CL 104 01/01/2018 0921   CO2 26 01/01/2018 0921   CO2 24 08/13/2017 1025   BUN 11 01/01/2018 0921   BUN 7.8 08/13/2017 1025   CREATININE 0.86 01/01/2018 0921   CREATININE 0.8 08/13/2017 1025      Component Value Date/Time   CALCIUM 10.3 01/01/2018 0921   CALCIUM 9.4 08/13/2017 1025   ALKPHOS 193 (H) 01/01/2018 0921   ALKPHOS 43 08/13/2017 1025   AST 121 (H) 01/01/2018 0921   AST 35 (H) 08/13/2017 1025   ALT 101 (H) 01/01/2018 0921   ALT 34 08/13/2017 1025   BILITOT 0.9 01/01/2018 0921   BILITOT 0.32 08/13/2017 1025       RADIOGRAPHIC STUDIES:  Mm Diag Breast Tomo Bilateral  Result Date: 12/16/2017 CLINICAL DATA:  History of treated left breast cancer, post lumpectomy 2006 and 2010.  History of treated right breast cancer status post lumpectomy and radiation therapy 2017. EXAM: DIGITAL DIAGNOSTIC BILATERAL MAMMOGRAM WITH CAD AND TOMO COMPARISON:  Previous exam(s). ACR Breast Density Category b: There are scattered areas of fibroglandular density. FINDINGS: Mammographically, there are no suspicious masses, areas of nonsurgical architectural distortion or microcalcifications in either breast. Stable posttreatment changes in both breasts. Mammographic images were processed with CAD. IMPRESSION: No mammographic evidence of malignancy in either breast, status post bilateral mastectomies. RECOMMENDATION: Diagnostic mammogram is suggested in 1 year. (Code:[id number]-B-01Y) I have  discussed the findings and recommendations with the patient. Results were also provided in writing at the conclusion of the visit. If applicable, a reminder letter will be sent to the patient regarding the next appointment. BI-RADS CATEGORY  2: Benign. Electronically Signed   By: Fidela Salisbury M.D.   On: 12/16/2017 14:30     ASSESSMENT/PLAN:  62 y.o.female  1. Colon Cancer, Stage I (pT2N0), MMR normal, oligo recurrent disease in liver in 12/2014, KRAS mutation (+), liver and lung recurrence in 06/2016 -Ms. Sanders-Miller appears stable. She completed final cycle Xeldoa on 12/18/17, she experienced severe fatigue with the last cycle, but overall tolerated course well.  -She was due to start Irinotecan with Avastin last week but this was held due to fatigue.   -She is feeling better today after a short course of steroids.   -She requests a refill of her steroids.  5 additional tablets were refilled.  I advised her to discuss this further with Dr. Burr Medico at her next visit. -Recommend that she proceed with cycle 1 of her treatment as scheduled today.   -Labs reviewed, CBC unremarkable except for a slightly elevated white blood cell count likely due to steroid use; CMP with moderate transaminitis, possibly related to  hepatic metastases. Bili is normal.    Okay to proceed with treatment as scheduled today. Will monitor closely.   2. Right breast cancer, pT1bN0M0, stage Ia, ER+/PR+/HER2-, History of left Breast Cancer, diagnosed on 02/01/2015 -No mammographic evidence of malignancy in either breast on 12/16/17, repeat in 1 year -Continue daily letrozole -She has frequent but stable hot flashes/night sweats.  She has declined a prescription for SSRI in the past.  She will let us know if she changes her mind.    3. H/o PE (01/06/14) -Compliant with Xarelto  4. Uterine fibroids  5. Kidney lesion (right)  6. Right flank pain, knee discomfort -Controlled on Tylenol #4.  Refilled today.   7. Morbid obesity -Patient has been referred to the dietitian previously.  8. HTN -On amlodipine, metoprolol, and HCTZ, BP remains elevated. She has appointment with new PCP Dr. Ola Spurr on 02/03/18.   9. Hypokalemia -K 3.2.  She is confirmed that she is taking K-Dur 20 mEq twice a day. -She will take K-Dur 20 mEq 3 times a day x1 week and then resume twice a day dosing.  PLAN -Labs reviewed, monitoring transaminitis  -Proceed with cycle 1 of Irinotecan Avastin today as scheduled.  She was reminded of the adverse effects of this treatment.  We discussed the use of Imodium if she develops any diarrhea. -Refilled Decadron 4 mg daily x5 days for fatigue -Increase K-Dur to 20 mEq 3 times a day for 1 week and then resume twice a day dosing -Lab, f/u in 2 weeks to monitor LFTs -Tylenol No. 4 refilled.  No orders of the defined types were placed in this encounter.  Mikey Bussing, DNP, AGPCNP-BC, AOCNP 01/08/18

## 2018-01-08 NOTE — Progress Notes (Signed)
Per Altamese Dilling, NP / Dr. Burr Medico OK to proceed with treatment with AST 106 and ALT 108

## 2018-01-08 NOTE — Patient Instructions (Signed)
[facility] Discharge Instructions for Patients Receiving Chemotherapy  Today you received the following chemotherapy agents :  Avastin,  Irinotecan.  To help prevent nausea and vomiting after your treatment, we encourage you to take your nausea medication as prescribed.   If you develop nausea and vomiting that is not controlled by your nausea medication, call the clinic.   BELOW ARE SYMPTOMS THAT SHOULD BE REPORTED IMMEDIATELY:  *FEVER GREATER THAN 100.5 F  *CHILLS WITH OR WITHOUT FEVER  NAUSEA AND VOMITING THAT IS NOT CONTROLLED WITH YOUR NAUSEA MEDICATION  *UNUSUAL SHORTNESS OF BREATH  *UNUSUAL BRUISING OR BLEEDING  TENDERNESS IN MOUTH AND THROAT WITH OR WITHOUT PRESENCE OF ULCERS  *URINARY PROBLEMS  *BOWEL PROBLEMS  UNUSUAL RASH Items with * indicate a potential emergency and should be followed up as soon as possible.  Feel free to call the clinic should you have any questions or concerns. The clinic phone number is [phone].  Please show the CHEMO ALERT CARD at check-in to the Emergency Department and triage nurse.   

## 2018-01-08 NOTE — Patient Instructions (Signed)

## 2018-01-12 ENCOUNTER — Inpatient Hospital Stay: Payer: BLUE CROSS/BLUE SHIELD | Admitting: Nutrition

## 2018-01-19 ENCOUNTER — Telehealth: Payer: Self-pay | Admitting: Hematology

## 2018-01-19 ENCOUNTER — Telehealth: Payer: Self-pay

## 2018-01-19 ENCOUNTER — Telehealth: Payer: Self-pay | Admitting: *Deleted

## 2018-01-19 NOTE — Telephone Encounter (Signed)
Rescheduled appt due to pt's husband having surgery, pt wanted to move appt to 5/17.

## 2018-01-19 NOTE — Telephone Encounter (Signed)
"  I'm scheduled Friday for treatment, unable to come in because my husband is having surgery Thursday.  I'm the only one to help him and after chemotherapy I feel terrible and will not be able to take care of him."  Call transferred to collaborative to assist with this request.

## 2018-01-19 NOTE — Telephone Encounter (Signed)
Patient called stating unable to come to appointment on Friday 5/10 for lab, flush, visit and infusion.  Desires to reschedule.  Schedule message sent.

## 2018-01-21 ENCOUNTER — Other Ambulatory Visit: Payer: Self-pay | Admitting: Hematology

## 2018-01-21 DIAGNOSIS — C50511 Malignant neoplasm of lower-outer quadrant of right female breast: Secondary | ICD-10-CM

## 2018-01-21 DIAGNOSIS — Z170 Estrogen receptor positive status [ER+]: Principal | ICD-10-CM

## 2018-01-22 ENCOUNTER — Other Ambulatory Visit: Payer: BLUE CROSS/BLUE SHIELD

## 2018-01-22 ENCOUNTER — Ambulatory Visit: Payer: BLUE CROSS/BLUE SHIELD

## 2018-01-22 ENCOUNTER — Ambulatory Visit: Payer: BLUE CROSS/BLUE SHIELD | Admitting: Hematology

## 2018-01-29 ENCOUNTER — Inpatient Hospital Stay: Payer: BLUE CROSS/BLUE SHIELD

## 2018-01-29 ENCOUNTER — Inpatient Hospital Stay: Payer: BLUE CROSS/BLUE SHIELD | Admitting: Nurse Practitioner

## 2018-01-29 NOTE — Progress Notes (Deleted)
Veronica Reyes  [phone] [phone]  Clinic Follow up Note   Patient Care Team: [name], MD as PCP - General (Internal Medicine) Leighton Ruff, MD as Consulting Physician (General Surgery) Carol Ada, MD as Consulting Physician (Gastroenterology) Concha Norway, MD (Inactive) as Consulting Physician (Internal Medicine) Emily Filbert, MD as Consulting Physician (Obstetrics and Gynecology) Tania Ade, RN as Registered Nurse (Medical Oncology) Stark Klein, MD as Consulting Physician (General Surgery) [name], MD as Consulting Physician (Hematology) 01/29/2018  SUMMARY OF ONCOLOGIC HISTORY: Oncology History   Breast cancer of lower-outer quadrant of right female breast [facility])   Staging form: Breast, AJCC 7th Edition     Clinical stage from 02/01/2015: Stage IA (T1a, N0, M0) - Signed by [name], MD on 06/04/2015     Pathologic stage from 11/10/2015: Stage IA (T1b, N0, M0) - Signed by [name], MD on 12/08/2015 Cancer of sigmoid colon [facility])   Staging form: Colon and Rectum, AJCC 7th Edition     Clinical: Stage I (T2, N0, M0) - Unsigned     Breast cancer of lower-outer quadrant of right female breast ([place])   2006 Cancer Diagnosis    Left breast DCIS diagnosed in 2006 and 2010, status post lumpectomy.      01/07/2014 Initial Diagnosis    Breast cancer      01/18/2015 Mammogram    78m mass in lower outer quadrant of right breast       02/01/2015 Initial Biopsy    (R) breast mass biopsy showed invasive ductal carcinoma & DCIS, grade 1-2.       02/01/2015 Receptors her2    ER 90%+, PR 10%+, Ki67 10%, HER2 negative (ratio 1.09)       11/07/2015 Surgery    (R) breast lumpectomy and sentinel lymph node biopsy ([name]      11/07/2015 Pathology Results    (R) breast lumpectomy showed invasive ductal carcinoma, grade 2, 1 cm, low-grade DCIS, negative margins, 3 sentinel lymph nodes were negative. LVI (-). HER2 repeated and remains  negative (ratio 1.29).        11/07/2015 Pathologic Stage    pT1b,pN0: Stage IA       11/07/2015 Oncotype testing    RS 22, which predicts a year risk of distant recurrence with tamoxifen alone 14% (intermediate-risk). Adjuvant chemo was not offered      02/21/2016 - 04/10/2016 Radiation Therapy    Adjuvant breast radiation. ([name]). Right breast: 50.4 Gy in 28 fractions.  Right breast boost: 12 Gy in 6 fractions.      05/2016 -  Anti-estrogen oral therapy    Femara (Letrozole) 2.5 mg daily.       10/17/2016 Mammogram    MM DIAG BREAST TOMO BILATERAL 10/17/2016 FINDINGS: There are bilateral lumpectomy changes in the upper central left breast and upper central right breast. No mass, nonsurgical distortion, or suspicious microcalcification is identified in either breast to suggest malignancy. Mammographic images were processed with CAD. IMPRESSION: No evidence of malignancy in either breast. Lumpectomy changes bilaterally. RECOMMENDATION: Diagnostic mammogram is suggested in 1 year. (Code:[id number])      12/16/2017 Mammogram    IMPRESSION: No mammographic evidence of malignancy in either breast, status post bilateral mastectomies. RECOMMENDATION: Diagnostic mammogram is suggested in 1 year.        [place] sigmoid colon [place]   03/04/2014 Initial Diagnosis    Cancer of sigmoid colon      03/09/2014 Pathology Results    G1 invasive  adenocarcinoma, no lymph-Vascular invasion or Peri-neural invasion: Absent. MMR normal, MSI stable.       03/09/2014 Surgery    partial colectomy, negative margins.       11/10/2014 Relapse/Recurrence    biopsy confirmed oligo liver recurrence       11/10/2014 Imaging    PET showed two small ares of hypermetabolism in the right lobe of the liver, no other distant metastasis.       11/30/2014 Imaging    abdomen MRI showed a new enhancing lesion which corresponds to an area of abnormal hyper metabolism on recent PET-CT. Stable enlarged portal caval  lymph node.       01/02/2015 Pathology Results    Liver biopsy showed metastatic adenocarcinoma, IHC (+) for CK20, CDX-2, (-) for TTF-1 and ER      01/02/2015 Miscellaneous    liver biopsy KRAS mutation (+)       02/14/2015 Imaging    CT showed:  the small metastatic right hepatic lobe liver lesion is not identified for certain on this examination. No new lesions. Stable small cysts in segment 4 a.       03/05/2015 - 04/16/2015 Chemotherapy    CAPEOX (capecitabine 2500 mg twice daily Day 1-14, oxaliplatin 1 30 mg/m on day 1, every 21 days, S/P 3 cycles       08/24/2015 Procedure    CT-guided oligo liver metastasis microwave ablation by Dr. [name]      06/17/2016 Imaging    CT of the chest/abd/pelvis with contrast 1. Several new small pulmonary nodules worrisome for metastatic disease. 2. New large area of probable infiltrating recurrent tumor in the right hepatic lobe surrounding the prior ablation site. 3. Small supraclavicular lymph nodes.  Attention on future studies. 4. Stable surgical changes involving both breasts. No breast masses are identified. 5. Stable bilateral adrenal gland nodules. 6. Stable upper abdominal and right-sided retroperitoneal lymph nodes.      07/08/2016 PET scan    1. Large mass in the right lobe of the liver demonstrates diffuse hypermetabolism, compatible with recurrent metastatic disease in the liver 2. Multiple small pulmonary nodules are very similar to the recent Chest CT 06/17/16.  3. No other findings of metastatic disease elsewhere in the neck, chest, abdomen, or pelvis. 4. There is some low-level hypermetabolic activity adjacent to the surgical clips in the lateral aspect of the right breast at the site of prior lumpectomy.       07/29/2016 Pathology Results    Liver biopsy confirmed metastatic colon cancer       07/31/2016 - 07/31/2017 Chemotherapy    Chemotherapy FOLFIRI and Avastin (from cycle 2) and Leucovorin (added from cycle 8)  every 2 weeks, started on 07/31/2016, multiple delay per pt's request; chemo break from 5/30-6/21/18; decreased leucovorin to 2000 mg/m2, irinotecan to 150m/m2, 5-fu 20032mm2 from 03/05/17, due to poor tolerance. Started chemo break since 07/31/17.       10/23/2016 Imaging    CT ABDOMEN PELVIS W CONTRAST 10/24/16 IMPRESSION: 1. The numerous tiny bilateral pulmonary nodules seen on the previous imaging exams are no longer evident, suggesting response of therapy. 2. Large ill-defined irregular right hepatic lesion measures smaller on today's study. 3. Slight increase in size of hepatoduodenal ligament lymphadenopathy. 4. Abdominal Aortic Atherosclerois (ICD10-170.0)      01/05/2017 Imaging    CT CAP IMPRESSION: 1. Left lower lobe pulmonary nodule and hepatic metastatic disease are stable. 2. Hepatoduodenal ligament lymph node is stable. 3. Aortic atherosclerosis (ICD10-170.0). Coronary artery calcification.  4. Enlarged pulmonary arteries, indicative of pulmonary arterial hypertension. 5. Small bilateral adrenal nodules are unchanged.      05/09/2017 Imaging    CT CAP IMPRESSION: 1. Stable appearance left lower lobe pulmonary nodule with an apparent new tiny posterior right upper lobe pulmonary nodule. 2. Dominant ill-defined right hepatic mass measures smaller on today's study. 3. Slight decrease in the hepatoduodenal ligament lymph node. 4. Aortic Atherosclerois (ICD10-170.0)      05/09/2017 Imaging    CT CAP 05/09/17 IMPRESSION: 1. Stable appearance left lower lobe pulmonary nodule with an apparent new tiny posterior right upper lobe pulmonary nodule. Continued attention on follow-up recommended. 2. Dominant ill-defined right hepatic mass measures smaller on today's study. 3. Slight decrease in the hepatoduodenal ligament lymph node. 4.  Aortic Atherosclerois (ICD10-170.0)       08/11/2017 Imaging    CT CAP W Contrast 08/11/17 IMPRESSION: 1. Essentially stable appearance of  small pulmonary nodules, hypodense mass laterally in the right hepatic lobe, and mild portacaval adenopathy. No new or progressive lesions. 2. Other imaging findings of potential clinical significance: Aortic Atherosclerosis (ICD10-I70.0). Lower lumbar impingement due to spondylosis and degenerative disc disease. Uterine fibroids. Bilateral renal cysts. Coronary atherosclerosis. Mild mitral valve calcification. Mild cardiomegaly.        09/24/2017 -  Chemotherapy    Maintenance Xeloda 2500 mg twice daily days 1 through 14 of each cycle of treatment and Avastin given every 3 weeks starting on 09/24/2017.       12/02/2017 Imaging    CT CAP W Contrast 12/02/17 IMPRESSION: Chest Impression: 1. Interval increase in size of bilateral small pulmonary nodules. 2. No mediastinal adenopathy  Abdomen / Pelvis Impression: 1. Dominant lesion in the RIGHT hepatic lobe is similar; however there are 2 adjacent low-density lesions in the RIGHT hepatic lobe which are not appreciated on comparison exam and concerning recurrent hepatic metastasis. 2. Interval increase in size periaortic retroperitoneal lymph nodes consistent with recurrent metastatic adenopathy.     CURRENT TREATMENT:  1. Letrozole 2.5 mg once daily, started in September 2017 2.Maintenance Xeloda2500 mg twice daily days 1 through 14 of each cycle of treatmentand Avastin given every 3 weeks starting on 09/24/2017.Plan to stop on 12/18/17.  3. Irinotecan plus Avastin every 2 weeks started on 01/08/18     INTERVAL HISTORY: Please see below for problem oriented charting.  REVIEW OF SYSTEMS:   Constitutional: Denies fevers, chills or abnormal weight loss Eyes: Denies blurriness of vision Ears, nose, mouth, throat, and face: Denies mucositis or sore throat Respiratory: Denies cough, dyspnea or wheezes Cardiovascular: Denies palpitation, chest discomfort or lower extremity swelling Gastrointestinal:  Denies nausea, heartburn or  change in bowel habits Skin: Denies abnormal skin rashes Lymphatics: Denies new lymphadenopathy or easy bruising Neurological:Denies numbness, tingling or new weaknesses Behavioral/Psych: Mood is stable, no new changes  All other systems were reviewed with the patient and are negative.  MEDICAL HISTORY:  Past Medical History:  Diagnosis Date  . Anxiety   . Breast CA ([place])    radiation and surgery-lt.  . Cancer ([place])    left breast cancer x2  . Hypertension   . Liver lesion   . Personal history of chemotherapy   . Personal history of radiation therapy 2017  . Pulmonary embolism ([place])    "blood clot in lungs" -dx. 4'15 with CT Chest. On Xarelto  . Radiation 01/22/10-03/12/10   left whole breast 4500 cGy, upper aspect boosted to 6120 cGy  . Radiation 02/21/16 - 04/10/16  right breast 50.4 Gy, right breast boost 12 Gy  . Shortness of breath    sob with exertion. dx. with Pulmonary emboli 4'15 ,tx. Xarelto,Lovenox used for [employer].    SURGICAL HISTORY: Past Surgical History:  Procedure Laterality Date  . BREAST BIOPSY Left 2010   malignant  . BREAST BIOPSY Right 01/2015   malignant  . BREAST LUMPECTOMY Left 2006  . BREAST LUMPECTOMY Left 2010  . BREAST LUMPECTOMY Right 11/07/2015  . BREAST LUMPECTOMY WITH RADIOACTIVE SEED AND SENTINEL LYMPH NODE BIOPSY Right 11/07/2015   Procedure: BREAST LUMPECTOMY WITH RADIOACTIVE SEED AND SENTINEL LYMPH NODE BIOPSY;  Surgeon: Stark Klein, MD;  Location: [place];  Service: General;  Laterality: Right;  . BREAST SURGERY     '11- Left breast lumpectomy  . CESAREAN SECTION    . CHOLECYSTECTOMY     Laparoscopic 20 yrs ago  . COLON SURGERY  03-09-14   partial colectomy for cancer  . IR GENERIC HISTORICAL  07/29/2016   IR FLUORO GUIDE PORT INSERTION RIGHT 07/29/2016 Sandi Mariscal, MD [facility]  . IR GENERIC HISTORICAL  07/29/2016   IR US GUIDE VASC ACCESS RIGHT 07/29/2016 Sandi Mariscal, MD [facility]  . IR GENERIC HISTORICAL   07/29/2016   IR US GUIDE BX ASP/DRAIN 07/29/2016 [facility]  . PARTIAL COLECTOMY N/A 03/09/2014   Procedure: LAPAROSCOPIC ASSISTED PARTIAL COLECTOMY, splenic flexure mobilization;  Surgeon: Leighton Ruff, MD;  Location: [facility];  Service: General;  Laterality: N/A;  . RADIOFREQUENCY ABLATION Right 08/24/2015   Procedure: MICROWAVE ABLATION RIGHT LIVER LOBE ;  Surgeon: Corrie Mckusick, DO;  Location: [facility];  Service: Anesthesiology;  Laterality: Right;    I have reviewed the social history and family history with the patient and they are unchanged from previous note.  ALLERGIES:  is allergic to tramadol.  MEDICATIONS:  Current Outpatient Medications  Medication Sig Dispense Refill  . acetaminophen-codeine (TYLENOL #4) 300-60 MG tablet Take 1 tablet by mouth every 8 (eight) hours as needed. for pain 40 tablet 0  . amLODipine (NORVASC) 5 MG tablet Take 1 tablet (5 mg total) by mouth daily. 30 tablet 0  . dexamethasone (DECADRON) 4 MG tablet Take 1 tablet (4 mg total) by mouth daily. 5 tablet 0  . ferrous sulfate 325 (65 FE) MG tablet Take 325 mg by mouth daily with breakfast. Pt takes  4 times per week.    . hydrochlorothiazide (HYDRODIURIL) 25 MG tablet Take 1 tablet (25 mg total) by mouth every morning. 30 tablet 1  . letrozole (FEMARA) 2.5 MG tablet TAKE 1 TABLET BY MOUTH EVERY DAY 30 tablet 3  . lidocaine-prilocaine (EMLA) cream Apply 1 application topically as needed. 30 g 2  . lidocaine-prilocaine (EMLA) cream Apply 1 application topically as needed. 30 g 2  . LORazepam (ATIVAN) 0.5 MG tablet Take 1 tablet (0.5 mg total) by mouth every 8 (eight) hours. 30 tablet 0  . magic mouthwash w/lidocaine SOLN Take 5 mLs by mouth 4 (four) times daily as needed for mouth pain. 240 mL 1  . metoprolol tartrate (LOPRESSOR) 50 MG tablet Take 1 tablet (50 mg total) by mouth 2 (two) times daily. 60 tablet 2  . ondansetron (ZOFRAN) 8 MG tablet Take 1 tablet (8 mg total) by mouth 2 (two) times daily as needed  for refractory nausea / vomiting. Start on day 3 after chemotherapy. 30 tablet 1  . potassium chloride SA (K-DUR,KLOR-CON) 20 MEQ tablet TAKE 1 TABLET BY MOUTH 2 TIMES DAILY 60 tablet  2  . prochlorperazine (COMPAZINE) 10 MG tablet Take 1 tablet (10 mg total) by mouth every 6 (six) hours as needed (NAUSEA). 30 tablet 2  . promethazine (PHENERGAN) 25 MG tablet Take 1 tablet (25 mg total) by mouth every 6 (six) hours as needed for nausea or vomiting. 30 tablet 2  . XARELTO 20 MG TABS tablet TAKE 1 TABLET BY MOUTH EVERY DAY 30 tablet 3  . zolpidem (AMBIEN) 5 MG tablet Take 1 tablet (5 mg total) by mouth at bedtime as needed for sleep. 20 tablet 0   No current facility-administered medications for this visit.     PHYSICAL EXAMINATION: ECOG PERFORMANCE STATUS: {CHL ONC ECOG PS:[id number]}  There were no vitals filed for this visit. There were no vitals filed for this visit.  GENERAL:alert, no distress and comfortable SKIN: skin color, texture, turgor are normal, no rashes or significant lesions EYES: normal, Conjunctiva are pink and non-injected, sclera clear OROPHARYNX:no exudate, no erythema and lips, buccal mucosa, and tongue normal  NECK: supple, thyroid normal size, non-tender, without nodularity LYMPH:  no palpable lymphadenopathy in the cervical, axillary or inguinal LUNGS: clear to auscultation and percussion with normal breathing effort HEART: regular rate & rhythm and no murmurs and no lower extremity edema ABDOMEN:abdomen soft, non-tender and normal bowel sounds Musculoskeletal:no cyanosis of digits and no clubbing  NEURO: alert & oriented x 3 with fluent speech, no focal motor/sensory deficits  LABORATORY DATA:  I have reviewed the data as listed CBC Latest Ref Rng & Units 01/08/2018 01/01/2018 12/04/2017  WBC 3.9 - 10.3 K/uL 12.3(H) 8.9 7.7  Hemoglobin 11.6 - 15.9 g/dL 14.2 13.5 13.2  Hematocrit 34.8 - 46.6 % 42.2 41.5 39.5  Platelets 145 - 400 K/uL 307 299 270     CMP  Latest Ref Rng & Units 01/08/2018 01/01/2018 12/04/2017  Glucose 70 - 140 mg/dL 127 98 98  BUN 7 - 26 mg/dL '18 11 10  '$ Creatinine 0.60 - 1.10 mg/dL 0.79 0.86 0.82  Sodium 136 - 145 mmol/L 136 137 138  Potassium 3.5 - 5.1 mmol/L 3.2(L) 3.7 3.3(L)  Chloride 98 - 109 mmol/L 103 104 103  CO2 22 - 29 mmol/L '23 26 25  '$ Calcium 8.4 - 10.4 mg/dL 9.8 10.3 10.0  Total Protein 6.4 - 8.3 g/dL 8.6(H) 8.7(H) 8.2  Total Bilirubin 0.2 - 1.2 mg/dL 0.7 0.9 0.9  Alkaline Phos 40 - 150 U/L 196(H) 193(H) 123  AST 5 - 34 U/L 106(H) 121(H) 62(H)  ALT 0 - 55 U/L 108(H) 101(H) 48   CEA  01/18/2014: 0.8 03/01/2015: 1.3 01/31/2016: 4.8 05/28/2016: 48 07/31/2016: 173.7 08/28/16: 139 09/25/2016: 43 11/04/16: 9.94 12/18/2016: 6.99 01/29/17: 4.58 03/04/17: 6.09 04/09/17: 4.22 05/07/17: 5.29 06/04/17: 4.12 07/02/17: 6.78 07/30/17 : 5.35 09/24/17: 5.41 10/23/17: 8.03 11/13/17: 12.32 01/01/18: PENDING   PATHOLOGY RESULT Diagnosis 01/02/2015 Liver, needle/core biopsy, right - POSITIVE FOR METASTATIC ADENOCARCINOMA. - SEE COMMENT.   Diagnosis 11/07/2015 1. Breast, lumpectomy, Right - INVASIVE DUCTAL CARCINOMA, GRADE 2, SPANNING 1 CM. - DUCTAL CARCINOMA IN SITU, LOW GRADE. - RESECTION MARGINS ARE NEGATIVE FOR INVASIVE CARCINOMA. - DUCTAL CARCINOMA IN SITU COMES TO WITHIN 0.3 CM OF THE POSTERIOR MARGIN. - BIOPSY SITE. - SEE ONCOLOGY TABLE. 2. Lymph node, sentinel, biopsy, right axillary #1 - ONE OF ONE LYMPH NODES NEGATIVE FOR CARCINOMA (0/1). 3. Lymph node, sentinel, biopsy, right axillary #2 - ONE OF ONE LYMPH NODES NEGATIVE FOR CARCINOMA (0/1). 4. Lymph node, sentinel, biopsy, right axillary #3 - ONE OF ONE LYMPH NODES NEGATIVE  FOR CARCINOMA (0/1).  ONCOTYPE DX: RS 82, which predicts a year risk of distant recurrence with tamoxifen alone 14%   Diagnosis 07/29/2016 Liver, needle/core biopsy, posterior of right lobe METASTATIC ADENOCARCINOMA, CONSISTENT WITH COLONIC PRIMARY.    RADIOGRAPHIC STUDIES: I  have personally reviewed the radiological images as listed and agreed with the findings in the report. No results found.   ASSESSMENT & PLAN: Veronica Reyes y.o.female  1. Colon Cancer, Stage I (pT2N0), MMR normal, oligo recurrent disease in liver in 12/2014, KRAS mutation (+), liver and lung recurrence in 06/2016 2. Right breast cancer, pT1bN0M0, stage Ia, ER+/PR+/HER2-, History of left Breast Cancer, diagnosed on 02/01/2015 3. H/O PE 01/06/14 - on Xarelto  4. Uterine fibroids 5. Kidney lesion (right) 6. Right flank pain, knee discomfort  7. Morbid obesity  8. HTN 9. Hypokalemia   PLAN No problem-specific Assessment & Plan notes found for this encounter.   No orders of the defined types were placed in this encounter.  All questions were answered. The patient knows to call the clinic with any problems, questions or concerns. No barriers to learning was detected. I spent {CHL ONC TIME VISIT - DUKGU:[id number]} counseling the patient face to face. The total time spent in the appointment was {CHL ONC TIME VISIT - EGBTD:[id number]} and more than 50% was on counseling and review of test results     Alla Feeling, NP 01/29/18

## 2018-02-01 ENCOUNTER — Telehealth: Payer: Self-pay | Admitting: Hematology

## 2018-02-01 NOTE — Telephone Encounter (Signed)
Appointments rescheduled and patient will pick up new schedule on Friday 5/24/  Per 5/17 sch msg

## 2018-02-03 ENCOUNTER — Ambulatory Visit: Payer: BLUE CROSS/BLUE SHIELD | Admitting: Internal Medicine

## 2018-02-05 ENCOUNTER — Inpatient Hospital Stay: Payer: BLUE CROSS/BLUE SHIELD | Attending: Hematology

## 2018-02-05 ENCOUNTER — Inpatient Hospital Stay: Payer: BLUE CROSS/BLUE SHIELD

## 2018-02-05 ENCOUNTER — Encounter: Payer: Self-pay | Admitting: Nurse Practitioner

## 2018-02-05 ENCOUNTER — Inpatient Hospital Stay (HOSPITAL_BASED_OUTPATIENT_CLINIC_OR_DEPARTMENT_OTHER): Payer: BLUE CROSS/BLUE SHIELD | Admitting: Nurse Practitioner

## 2018-02-05 VITALS — BP 145/87 | HR 60 | Temp 97.7°F | Resp 18 | Ht 66.0 in | Wt 278.5 lb

## 2018-02-05 VITALS — BP 131/71 | HR 79 | Resp 18

## 2018-02-05 DIAGNOSIS — Z5112 Encounter for antineoplastic immunotherapy: Secondary | ICD-10-CM | POA: Insufficient documentation

## 2018-02-05 DIAGNOSIS — C787 Secondary malignant neoplasm of liver and intrahepatic bile duct: Secondary | ICD-10-CM | POA: Diagnosis not present

## 2018-02-05 DIAGNOSIS — C187 Malignant neoplasm of sigmoid colon: Secondary | ICD-10-CM | POA: Diagnosis not present

## 2018-02-05 DIAGNOSIS — Z86711 Personal history of pulmonary embolism: Secondary | ICD-10-CM | POA: Insufficient documentation

## 2018-02-05 DIAGNOSIS — Z170 Estrogen receptor positive status [ER+]: Principal | ICD-10-CM

## 2018-02-05 DIAGNOSIS — C50511 Malignant neoplasm of lower-outer quadrant of right female breast: Secondary | ICD-10-CM

## 2018-02-05 DIAGNOSIS — R61 Generalized hyperhidrosis: Secondary | ICD-10-CM | POA: Insufficient documentation

## 2018-02-05 DIAGNOSIS — R970 Elevated carcinoembryonic antigen [CEA]: Secondary | ICD-10-CM

## 2018-02-05 DIAGNOSIS — Z5111 Encounter for antineoplastic chemotherapy: Secondary | ICD-10-CM | POA: Insufficient documentation

## 2018-02-05 DIAGNOSIS — Z95828 Presence of other vascular implants and grafts: Secondary | ICD-10-CM

## 2018-02-05 DIAGNOSIS — R5383 Other fatigue: Secondary | ICD-10-CM

## 2018-02-05 DIAGNOSIS — R109 Unspecified abdominal pain: Secondary | ICD-10-CM

## 2018-02-05 DIAGNOSIS — I10 Essential (primary) hypertension: Secondary | ICD-10-CM | POA: Insufficient documentation

## 2018-02-05 DIAGNOSIS — C7800 Secondary malignant neoplasm of unspecified lung: Secondary | ICD-10-CM | POA: Insufficient documentation

## 2018-02-05 DIAGNOSIS — R002 Palpitations: Secondary | ICD-10-CM | POA: Insufficient documentation

## 2018-02-05 DIAGNOSIS — E876 Hypokalemia: Secondary | ICD-10-CM | POA: Insufficient documentation

## 2018-02-05 DIAGNOSIS — Z853 Personal history of malignant neoplasm of breast: Secondary | ICD-10-CM | POA: Diagnosis not present

## 2018-02-05 LAB — CMP (CANCER CENTER ONLY)
ALBUMIN: 2.8 g/dL — AB (ref 3.5–5.0)
ALK PHOS: 186 U/L — AB (ref 40–150)
ALT: 90 U/L — AB (ref 0–55)
AST: 97 U/L — ABNORMAL HIGH (ref 5–34)
Anion gap: 11 (ref 3–11)
BILIRUBIN TOTAL: 0.5 mg/dL (ref 0.2–1.2)
BUN: 12 mg/dL (ref 7–26)
CALCIUM: 9.7 mg/dL (ref 8.4–10.4)
CO2: 24 mmol/L (ref 22–29)
CREATININE: 0.77 mg/dL (ref 0.60–1.10)
Chloride: 106 mmol/L (ref 98–109)
GFR, Estimated: >60 mL/min (ref >60)
GLUCOSE: 137 mg/dL (ref 70–140)
Potassium: 3.5 mmol/L (ref 3.5–5.1)
SODIUM: 141 mmol/L (ref 136–145)
Total Protein: 7.8 g/dL (ref 6.4–8.3)

## 2018-02-05 LAB — CBC WITH DIFFERENTIAL (CANCER CENTER ONLY)
BASOS PCT: 1 %
Basophils Absolute: 0.1 10*3/uL (ref 0.0–0.1)
EOS ABS: 0.2 10*3/uL (ref 0.0–0.5)
EOS PCT: 2 %
HCT: 39.3 % (ref 34.8–46.6)
Hemoglobin: 13.1 g/dL (ref 11.6–15.9)
Lymphocytes Relative: 22 %
Lymphs Abs: 2.3 10*3/uL (ref 0.9–3.3)
MCH: 32.5 pg (ref 25.1–34.0)
MCHC: 33.3 g/dL (ref 31.5–36.0)
MCV: 97.7 fL (ref 79.5–101.0)
MONO ABS: 0.6 10*3/uL (ref 0.1–0.9)
Monocytes Relative: 6 %
Neutro Abs: 7 10*3/uL — ABNORMAL HIGH (ref 1.5–6.5)
Neutrophils Relative %: 69 %
PLATELETS: 286 10*3/uL (ref 145–400)
RBC: 4.02 MIL/uL (ref 3.70–5.45)
RDW: 17.2 % — AB (ref 11.2–14.5)
WBC Count: 10.2 10*3/uL (ref 3.9–10.3)

## 2018-02-05 LAB — TOTAL PROTEIN, URINE DIPSTICK: PROTEIN: NEGATIVE mg/dL

## 2018-02-05 LAB — CEA (IN HOUSE-CHCC): CEA (CHCC-IN HOUSE): 39.93 ng/mL — AB (ref 0.00–5.00)

## 2018-02-05 MED ORDER — METOPROLOL TARTRATE 50 MG PO TABS: 50.0000 mg | ORAL_TABLET | Freq: Two times a day (BID) | ORAL | 1 refills | Status: AC

## 2018-02-05 MED ORDER — IRINOTECAN HCL CHEMO INJECTION 100 MG/5ML
130.0000 mg/m2 | Freq: Once | INTRAVENOUS | Status: AC
  Administered 2018-02-05: 320 mg via INTRAVENOUS
  Filled 2018-02-05: qty 16

## 2018-02-05 MED ORDER — HEPARIN SOD (PORK) LOCK FLUSH 100 UNIT/ML IV SOLN
500.0000 [IU] | Freq: Once | INTRAVENOUS | Status: AC | PRN
  Administered 2018-02-05: 500 [IU]
  Filled 2018-02-05: qty 5

## 2018-02-05 MED ORDER — ACETAMINOPHEN-CODEINE #4 300-60 MG PO TABS: 1.0000 | ORAL_TABLET | Freq: Three times a day (TID) | ORAL | 0 refills | Status: DC | PRN

## 2018-02-05 MED ORDER — PROCHLORPERAZINE MALEATE 10 MG PO TABS
ORAL_TABLET | ORAL | Status: AC
  Filled 2018-02-05: qty 1

## 2018-02-05 MED ORDER — ATROPINE SULFATE 1 MG/ML IJ SOLN
INTRAMUSCULAR | Status: AC
  Filled 2018-02-05: qty 1

## 2018-02-05 MED ORDER — DEXAMETHASONE 4 MG PO TABS: 4.0000 mg | ORAL_TABLET | Freq: Every day | ORAL | 1 refills | Status: DC

## 2018-02-05 MED ORDER — SODIUM CHLORIDE 0.9 % IV SOLN
600.0000 mg | Freq: Once | INTRAVENOUS | Status: AC
  Administered 2018-02-05: 600 mg via INTRAVENOUS
  Filled 2018-02-05: qty 16

## 2018-02-05 MED ORDER — SODIUM CHLORIDE 0.9 % IV SOLN
Freq: Once | INTRAVENOUS | Status: AC
  Administered 2018-02-05: 14:00:00 via INTRAVENOUS
  Filled 2018-02-05: qty 5

## 2018-02-05 MED ORDER — SODIUM CHLORIDE 0.9 % IV SOLN
Freq: Once | INTRAVENOUS | Status: AC
  Administered 2018-02-05: 13:00:00 via INTRAVENOUS

## 2018-02-05 MED ORDER — PROCHLORPERAZINE MALEATE 10 MG PO TABS
10.0000 mg | ORAL_TABLET | Freq: Once | ORAL | Status: AC | PRN
  Administered 2018-02-05: 10 mg via ORAL

## 2018-02-05 MED ORDER — PALONOSETRON HCL INJECTION 0.25 MG/5ML
INTRAVENOUS | Status: AC
  Filled 2018-02-05: qty 5

## 2018-02-05 MED ORDER — HYDROCHLOROTHIAZIDE 25 MG PO TABS: 25.0000 mg | ORAL_TABLET | ORAL | 1 refills | Status: DC

## 2018-02-05 MED ORDER — ZOLPIDEM TARTRATE 5 MG PO TABS: 5.0000 mg | ORAL_TABLET | Freq: Every evening | ORAL | 0 refills | Status: AC | PRN

## 2018-02-05 MED ORDER — SODIUM CHLORIDE 0.9% FLUSH
10.0000 mL | INTRAVENOUS | Status: DC | PRN
Start: 2018-02-05 — End: 2018-02-05
  Administered 2018-02-05: 10 mL via INTRAVENOUS
  Filled 2018-02-05: qty 10

## 2018-02-05 MED ORDER — ATROPINE SULFATE 1 MG/ML IJ SOLN
0.5000 mg | Freq: Once | INTRAMUSCULAR | Status: AC | PRN
  Administered 2018-02-05: 0.5 mg via INTRAVENOUS

## 2018-02-05 MED ORDER — PALONOSETRON HCL INJECTION 0.25 MG/5ML
0.2500 mg | Freq: Once | INTRAVENOUS | Status: AC
  Administered 2018-02-05: 0.25 mg via INTRAVENOUS

## 2018-02-05 MED ORDER — SODIUM CHLORIDE 0.9% FLUSH
10.0000 mL | INTRAVENOUS | Status: DC | PRN
  Administered 2018-02-05: 10 mL
  Filled 2018-02-05: qty 10

## 2018-02-05 NOTE — Patient Instructions (Signed)
[facility] Discharge Instructions for Patients Receiving Chemotherapy  Today you received the following chemotherapy agents Avastin,Irinotecan  To help prevent nausea and vomiting after your treatment, we encourage you to take your nausea medication as directed  If you develop nausea and vomiting that is not controlled by your nausea medication, call the clinic.   BELOW ARE SYMPTOMS THAT SHOULD BE REPORTED IMMEDIATELY:  *FEVER GREATER THAN 100.5 F  *CHILLS WITH OR WITHOUT FEVER  NAUSEA AND VOMITING THAT IS NOT CONTROLLED WITH YOUR NAUSEA MEDICATION  *UNUSUAL SHORTNESS OF BREATH  *UNUSUAL BRUISING OR BLEEDING  TENDERNESS IN MOUTH AND THROAT WITH OR WITHOUT PRESENCE OF ULCERS  *URINARY PROBLEMS  *BOWEL PROBLEMS  UNUSUAL RASH Items with * indicate a potential emergency and should be followed up as soon as possible.  Feel free to call the clinic should you have any questions or concerns. The clinic phone number is [phone].  Please show the CHEMO ALERT CARD at check-in to the Emergency Department and triage nurse.   

## 2018-02-05 NOTE — Progress Notes (Addendum)
[place]  [phone] [phone]  Clinic Follow up Note   Patient Care Team: [name], MD as PCP - General (Internal Medicine) Leighton Ruff, MD as Consulting Physician (General Surgery) Carol Ada, MD as Consulting Physician (Gastroenterology) Concha Norway, MD (Inactive) as Consulting Physician (Internal Medicine) Emily Filbert, MD as Consulting Physician (Obstetrics and Gynecology) Tania Ade, RN as Registered Nurse (Medical Oncology) Stark Klein, MD as Consulting Physician (General Surgery) [name], MD as Consulting Physician (Hematology) 02/05/2018  SUMMARY OF ONCOLOGIC HISTORY: Oncology History   Breast cancer of lower-outer quadrant of right female breast [facility])   Staging form: Breast, AJCC 7th Edition     Clinical stage from 02/01/2015: Stage IA (T1a, N0, M0) - Signed by [name], MD on 06/04/2015     Pathologic stage from 11/10/2015: Stage IA (T1b, N0, M0) - Signed by [name], MD on 12/08/2015 Cancer of sigmoid colon [facility])   Staging form: Colon and Rectum, AJCC 7th Edition     Clinical: Stage I (T2, N0, M0) - Unsigned     Breast cancer of lower-outer quadrant of right female breast ([place])   2006 Cancer Diagnosis    Left breast DCIS diagnosed in 2006 and 2010, status post lumpectomy.      01/07/2014 Initial Diagnosis    Breast cancer      01/18/2015 Mammogram    70m mass in lower outer quadrant of right breast       02/01/2015 Initial Biopsy    (R) breast mass biopsy showed invasive ductal carcinoma & DCIS, grade 1-2.       02/01/2015 Receptors her2    ER 90%+, PR 10%+, Ki67 10%, HER2 negative (ratio 1.09)       11/07/2015 Surgery    (R) breast lumpectomy and sentinel lymph node biopsy ([name]      11/07/2015 Pathology Results    (R) breast lumpectomy showed invasive ductal carcinoma, grade 2, 1 cm, low-grade DCIS, negative margins, 3 sentinel lymph nodes were negative. LVI (-). HER2 repeated and remains  negative (ratio 1.29).        11/07/2015 Pathologic Stage    pT1b,pN0: Stage IA       11/07/2015 Oncotype testing    RS 22, which predicts a year risk of distant recurrence with tamoxifen alone 14% (intermediate-risk). Adjuvant chemo was not offered      02/21/2016 - 04/10/2016 Radiation Therapy    Adjuvant breast radiation. ([name]). Right breast: 50.4 Gy in 28 fractions.  Right breast boost: 12 Gy in 6 fractions.      05/2016 -  Anti-estrogen oral therapy    Femara (Letrozole) 2.5 mg daily.       10/17/2016 Mammogram    MM DIAG BREAST TOMO BILATERAL 10/17/2016 FINDINGS: There are bilateral lumpectomy changes in the upper central left breast and upper central right breast. No mass, nonsurgical distortion, or suspicious microcalcification is identified in either breast to suggest malignancy. Mammographic images were processed with CAD. IMPRESSION: No evidence of malignancy in either breast. Lumpectomy changes bilaterally. RECOMMENDATION: Diagnostic mammogram is suggested in 1 year. (Code:[id number])      12/16/2017 Mammogram    IMPRESSION: No mammographic evidence of malignancy in either breast, status post bilateral mastectomies. RECOMMENDATION: Diagnostic mammogram is suggested in 1 year.        Cancer of sigmoid colon ([place]   03/04/2014 Initial Diagnosis    Cancer of sigmoid colon      03/09/2014 Pathology Results    G1 invasive  adenocarcinoma, no lymph-Vascular invasion or Peri-neural invasion: Absent. MMR normal, MSI stable.       03/09/2014 Surgery    partial colectomy, negative margins.       11/10/2014 Relapse/Recurrence    biopsy confirmed oligo liver recurrence       11/10/2014 Imaging    PET showed two small ares of hypermetabolism in the right lobe of the liver, no other distant metastasis.       11/30/2014 Imaging    abdomen MRI showed a new enhancing lesion which corresponds to an area of abnormal hyper metabolism on recent PET-CT. Stable enlarged portal caval  lymph node.       01/02/2015 Pathology Results    Liver biopsy showed metastatic adenocarcinoma, IHC (+) for CK20, CDX-2, (-) for TTF-1 and ER      01/02/2015 Miscellaneous    liver biopsy KRAS mutation (+)       02/14/2015 Imaging    CT showed:  the small metastatic right hepatic lobe liver lesion is not identified for certain on this examination. No new lesions. Stable small cysts in segment 4 a.       03/05/2015 - 04/16/2015 Chemotherapy    CAPEOX (capecitabine 2500 mg twice daily Day 1-14, oxaliplatin 1 30 mg/m on day 1, every 21 days, S/P 3 cycles       08/24/2015 Procedure    CT-guided oligo liver metastasis microwave ablation by Dr. [name]      06/17/2016 Imaging    CT of the chest/abd/pelvis with contrast 1. Several new small pulmonary nodules worrisome for metastatic disease. 2. New large area of probable infiltrating recurrent tumor in the right hepatic lobe surrounding the prior ablation site. 3. Small supraclavicular lymph nodes.  Attention on future studies. 4. Stable surgical changes involving both breasts. No breast masses are identified. 5. Stable bilateral adrenal gland nodules. 6. Stable upper abdominal and right-sided retroperitoneal lymph nodes.      07/08/2016 PET scan    1. Large mass in the right lobe of the liver demonstrates diffuse hypermetabolism, compatible with recurrent metastatic disease in the liver 2. Multiple small pulmonary nodules are very similar to the recent Chest CT 06/17/16.  3. No other findings of metastatic disease elsewhere in the neck, chest, abdomen, or pelvis. 4. There is some low-level hypermetabolic activity adjacent to the surgical clips in the lateral aspect of the right breast at the site of prior lumpectomy.       07/29/2016 Pathology Results    Liver biopsy confirmed metastatic colon cancer       07/31/2016 - 07/31/2017 Chemotherapy    Chemotherapy FOLFIRI and Avastin (from cycle 2) and Leucovorin (added from cycle 8)  every 2 weeks, started on 07/31/2016, multiple delay per pt's request; chemo break from 5/30-6/21/18; decreased leucovorin to 2000 mg/m2, irinotecan to 150m/m2, 5-fu 20032mm2 from 03/05/17, due to poor tolerance. Started chemo break since 07/31/17.       10/23/2016 Imaging    CT ABDOMEN PELVIS W CONTRAST 10/24/16 IMPRESSION: 1. The numerous tiny bilateral pulmonary nodules seen on the previous imaging exams are no longer evident, suggesting response of therapy. 2. Large ill-defined irregular right hepatic lesion measures smaller on today's study. 3. Slight increase in size of hepatoduodenal ligament lymphadenopathy. 4. Abdominal Aortic Atherosclerois (ICD10-170.0)      01/05/2017 Imaging    CT CAP IMPRESSION: 1. Left lower lobe pulmonary nodule and hepatic metastatic disease are stable. 2. Hepatoduodenal ligament lymph node is stable. 3. Aortic atherosclerosis (ICD10-170.0). Coronary artery calcification.  4. Enlarged pulmonary arteries, indicative of pulmonary arterial hypertension. 5. Small bilateral adrenal nodules are unchanged.      05/09/2017 Imaging    CT CAP IMPRESSION: 1. Stable appearance left lower lobe pulmonary nodule with an apparent new tiny posterior right upper lobe pulmonary nodule. 2. Dominant ill-defined right hepatic mass measures smaller on today's study. 3. Slight decrease in the hepatoduodenal ligament lymph node. 4. Aortic Atherosclerois (ICD10-170.0)      05/09/2017 Imaging    CT CAP 05/09/17 IMPRESSION: 1. Stable appearance left lower lobe pulmonary nodule with an apparent new tiny posterior right upper lobe pulmonary nodule. Continued attention on follow-up recommended. 2. Dominant ill-defined right hepatic mass measures smaller on today's study. 3. Slight decrease in the hepatoduodenal ligament lymph node. 4.  Aortic Atherosclerois (ICD10-170.0)       08/11/2017 Imaging    CT CAP W Contrast 08/11/17 IMPRESSION: 1. Essentially stable appearance of  small pulmonary nodules, hypodense mass laterally in the right hepatic lobe, and mild portacaval adenopathy. No new or progressive lesions. 2. Other imaging findings of potential clinical significance: Aortic Atherosclerosis (ICD10-I70.0). Lower lumbar impingement due to spondylosis and degenerative disc disease. Uterine fibroids. Bilateral renal cysts. Coronary atherosclerosis. Mild mitral valve calcification. Mild cardiomegaly.        09/24/2017 -  Chemotherapy    Maintenance Xeloda 2500 mg twice daily days 1 through 14 of each cycle of treatment and Avastin given every 3 weeks starting on 09/24/2017.       12/02/2017 Imaging    CT CAP W Contrast 12/02/17 IMPRESSION: Chest Impression: 1. Interval increase in size of bilateral small pulmonary nodules. 2. No mediastinal adenopathy  Abdomen / Pelvis Impression: 1. Dominant lesion in the RIGHT hepatic lobe is similar; however there are 2 adjacent low-density lesions in the RIGHT hepatic lobe which are not appreciated on comparison exam and concerning recurrent hepatic metastasis. 2. Interval increase in size periaortic retroperitoneal lymph nodes consistent with recurrent metastatic adenopathy.     PRIOR THERAPY: 1)status post lumpectomy in 2016 and 2011;radiation in 2011 2) CAPEOX6/20/2016 through 04/16/2015 3)Chemotherapy FOLFIRI and Avastin (from cycle 2)andLeucovorin(added from cycle 8) every 2 weeks, started on 07/31/2016, multiple delay per pt's request; chemo break from 5/30-6/21/18; decreasedleucovorin to 2000 mg/m2,irinotecan to '140mg'$ /m2, 5-fu '2000mg'$ /m2 from 03/05/17, due to poor tolerance.Started chemo break since 07/31/17. 4) Xeloda2500 mg twice daily days 1 through 14 of each cycle of treatmentand Avastin given every 3 weeks.First dose 09/24/2017.  Stopped on 12/18/2017 due to disease progression.  CURRENT THERAPY: 1. Letrozole 2.5 mg once daily, started in September 2017 2.Irinotecan plus Avastin every 2  weeks.  First dose started on 01/08/18   INTERVAL HISTORY: Ms. Bronkema returns for follow up as scheduled with next cycle irinotecan/avastin. She deferred last few treatments due to her husband's recent lung cancer diagnosis and subsequent surgery. He is recovering well but the patient remains overwhelmed. She is very fatigued with low activity tolerance. Has to sit in the shower. Becomes dyspneic with little exertion. Occasionally feels heart "flutter" without cough, chest pain, syncope, lightheadedness, or dizziness. She thinks could be related to anxiety. She is anxious about missing treatments and being so tired, she reports "this is not me." night sweats are stable. Right flank pain is improved overall, currently rates 4/10. Takes average of 2 tylenol #4 per day. Has good appetite, no n/v/c/d, neuropathy, or recent fever/chills.   REVIEW OF SYSTEMS:   Constitutional: Denies fevers, chills or abnormal weight loss (+) night sweats (+) progressive fatigue (+)  low activity tolerance  Eyes: Denies blurriness of vision Ears, nose, mouth, throat, and face: Denies mucositis or sore throat Respiratory: Denies cough or wheezes (+) DOE  Cardiovascular: Denies chest discomfort or lower extremity swelling (+) palpitation "flutter"  Gastrointestinal:  Denies nausea, vomiting, constipation, diarrhea, heartburn or change in bowel habits (+) right flank pain, stable  Skin: Denies abnormal skin rashes Lymphatics: Denies new lymphadenopathy or easy bruising Neurological:Denies numbness, tingling or new weaknesses Behavioral/Psych: Mood is stable, no new changes (+) anxious (+) tearful (+) difficulty sleeping, on Ambien All other systems were reviewed with the patient and are negative.  MEDICAL HISTORY:  Past Medical History:  Diagnosis Date  . Anxiety   . Breast CA ([place])    radiation and surgery-lt.  . Cancer ([place])    left breast cancer x2  . Hypertension   . Liver lesion   . Personal history of  chemotherapy   . Personal history of radiation therapy 2017  . Pulmonary embolism ([place])    "blood clot in lungs" -dx. 4'15 with CT Chest. On Xarelto  . Radiation 01/22/10-03/12/10   left whole breast 4500 cGy, upper aspect boosted to 6120 cGy  . Radiation 02/21/16 - 04/10/16   right breast 50.4 Gy, right breast boost 12 Gy  . Shortness of breath    sob with exertion. dx. with Pulmonary emboli 4'15 ,tx. Xarelto,Lovenox used for [employer].    SURGICAL HISTORY: Past Surgical History:  Procedure Laterality Date  . BREAST BIOPSY Left 2010   malignant  . BREAST BIOPSY Right 01/2015   malignant  . BREAST LUMPECTOMY Left 2006  . BREAST LUMPECTOMY Left 2010  . BREAST LUMPECTOMY Right 11/07/2015  . BREAST LUMPECTOMY WITH RADIOACTIVE SEED AND SENTINEL LYMPH NODE BIOPSY Right 11/07/2015   Procedure: BREAST LUMPECTOMY WITH RADIOACTIVE SEED AND SENTINEL LYMPH NODE BIOPSY;  Surgeon: Stark Klein, MD;  Location: [facility];  Service: General;  Laterality: Right;  . BREAST SURGERY     '11- Left breast lumpectomy  . CESAREAN SECTION    . CHOLECYSTECTOMY     Laparoscopic 20 yrs ago  . COLON SURGERY  03-09-14   partial colectomy for cancer  . IR GENERIC HISTORICAL  07/29/2016   IR FLUORO GUIDE PORT INSERTION RIGHT 07/29/2016 Sandi Mariscal, MD [facility]  . IR GENERIC HISTORICAL  07/29/2016   IR US GUIDE VASC ACCESS RIGHT 07/29/2016 Sandi Mariscal, MD [facility]  . IR GENERIC HISTORICAL  07/29/2016   IR US GUIDE BX ASP/DRAIN 07/29/2016 [facility]  . PARTIAL COLECTOMY N/A 03/09/2014   Procedure: LAPAROSCOPIC ASSISTED PARTIAL COLECTOMY, splenic flexure mobilization;  Surgeon: Leighton Ruff, MD;  Location: [facility];  Service: General;  Laterality: N/A;  . RADIOFREQUENCY ABLATION Right 08/24/2015   Procedure: MICROWAVE ABLATION RIGHT LIVER LOBE ;  Surgeon: Corrie Mckusick, DO;  Location: [facility];  Service: Anesthesiology;  Laterality: Right;    I have reviewed the social history and family history  with the patient and they are unchanged from previous note.  ALLERGIES:  is allergic to tramadol.  MEDICATIONS:  Current Outpatient Medications  Medication Sig Dispense Refill  . acetaminophen-codeine (TYLENOL #4) 300-60 MG tablet Take 1 tablet by mouth every 8 (eight) hours as needed. for pain 40 tablet 0  . amLODipine (NORVASC) 5 MG tablet Take 1 tablet (5 mg total) by mouth daily. 30 tablet 0  . hydrochlorothiazide (HYDRODIURIL) 25 MG tablet Take 1 tablet (25 mg total) by mouth every morning. 30 tablet 1  . letrozole [facility])  2.5 MG tablet TAKE 1 TABLET BY MOUTH EVERY DAY 30 tablet 3  . lidocaine-prilocaine (EMLA) cream Apply 1 application topically as needed. 30 g 2  . lidocaine-prilocaine (EMLA) cream Apply 1 application topically as needed. 30 g 2  . magic mouthwash w/lidocaine SOLN Take 5 mLs by mouth 4 (four) times daily as needed for mouth pain. 240 mL 1  . metoprolol tartrate (LOPRESSOR) 50 MG tablet Take 1 tablet (50 mg total) by mouth 2 (two) times daily. 60 tablet 1  . ondansetron (ZOFRAN) 8 MG tablet Take 1 tablet (8 mg total) by mouth 2 (two) times daily as needed for refractory nausea / vomiting. Start on day 3 after chemotherapy. 30 tablet 1  . potassium chloride SA (K-DUR,KLOR-CON) 20 MEQ tablet TAKE 1 TABLET BY MOUTH 2 TIMES DAILY 60 tablet 2  . prochlorperazine (COMPAZINE) 10 MG tablet Take 1 tablet (10 mg total) by mouth every 6 (six) hours as needed (NAUSEA). 30 tablet 2  . promethazine (PHENERGAN) 25 MG tablet Take 1 tablet (25 mg total) by mouth every 6 (six) hours as needed for nausea or vomiting. 30 tablet 2  . XARELTO 20 MG TABS tablet TAKE 1 TABLET BY MOUTH EVERY DAY 30 tablet 3  . zolpidem (AMBIEN) 5 MG tablet Take 1 tablet (5 mg total) by mouth at bedtime as needed for sleep. 20 tablet 0  . dexamethasone (DECADRON) 4 MG tablet Take 1 tablet (4 mg total) by mouth daily. 3 tablet 1  . ferrous sulfate 325 (65 FE) MG tablet Take 325 mg by mouth daily with breakfast. Pt  takes  4 times per week.    Marland Kitchen LORazepam (ATIVAN) 0.5 MG tablet Take 1 tablet (0.5 mg total) by mouth every 8 (eight) hours. 30 tablet 0   No current facility-administered medications for this visit.     PHYSICAL EXAMINATION: ECOG PERFORMANCE STATUS: 3 - Symptomatic, >50% confined to bed  Vitals:   02/05/18 1134  BP: (!) 145/87  Pulse: 60  Resp: 18  Temp: 97.7 F (36.5 C)  SpO2: 98%   Filed Weights   02/05/18 1134  Weight: 278 lb 8 oz (126.3 kg)    GENERAL: drowsy, no acute distress and head resting in arms  SKIN:  no rashes or significant lesions EYES: normal, Conjunctiva are pink and non-injected, sclera clear OROPHARYNX:no thrush or ulcers  LYMPH:  no palpable cervical, supraclavicular, or axillary lymphadenopathy LUNGS: clear to auscultation with normal breathing effort HEART: regular rate & rhythm and no murmurs and no lower extremity edema ABDOMEN:abdomen soft, non-tender and normal bowel sounds Musculoskeletal:no cyanosis of digits and no clubbing  NEURO: alert & oriented x 3 with fluent speech, no focal motor/sensory deficits BREAST exam deferred  PAC without erythema   LABORATORY DATA:  I have reviewed the data as listed CBC Latest Ref Rng & Units 02/05/2018 01/08/2018 01/01/2018  WBC 3.9 - 10.3 K/uL 10.2 12.3(H) 8.9  Hemoglobin 11.6 - 15.9 g/dL 13.1 14.2 13.5  Hematocrit 34.8 - 46.6 % 39.3 42.2 41.5  Platelets 145 - 400 K/uL 286 307 299     CMP Latest Ref Rng & Units 02/05/2018 01/08/2018 01/01/2018  Glucose 70 - 140 mg/dL 137 127 98  BUN 7 - 26 mg/dL '12 18 11  '$ Creatinine 0.60 - 1.10 mg/dL 0.77 0.79 0.86  Sodium 136 - 145 mmol/L 141 136 137  Potassium 3.5 - 5.1 mmol/L 3.5 3.2(L) 3.7  Chloride 98 - 109 mmol/L 106 103 104  CO2 22 - 29 mmol/L  $'24 23 26  'F$ Calcium 8.4 - 10.4 mg/dL 9.7 9.8 10.3  Total Protein 6.4 - 8.3 g/dL 7.8 8.6(H) 8.7(H)  Total Bilirubin 0.2 - 1.2 mg/dL 0.5 0.7 0.9  Alkaline Phos 40 - 150 U/L 186(H) 196(H) 193(H)  AST 5 - 34 U/L 97(H) 106(H)  121(H)  ALT 0 - 55 U/L 90(H) 108(H) 101(H)   CEA  01/18/2014: 0.8 03/01/2015: 1.3 01/31/2016: 4.8 05/28/2016: 48 07/31/2016: 173.7 08/28/16: 139 09/25/2016: 43 11/04/16: 9.94 12/18/2016: 6.99 01/29/17: 4.58 03/04/17: 6.09 04/09/17: 4.22 05/07/17: 5.29 06/04/17: 4.12 07/02/17: 6.78 07/30/17 : 5.35 09/24/17: 5.41 10/23/17: 8.03 11/13/17: 12.32 01/01/18: 27.70 02/05/18: PENDING    PATHOLOGY RESULT Diagnosis 01/02/2015 Liver, needle/core biopsy, right - POSITIVE FOR METASTATIC ADENOCARCINOMA. - SEE COMMENT.   Diagnosis 11/07/2015 1. Breast, lumpectomy, Right - INVASIVE DUCTAL CARCINOMA, GRADE 2, SPANNING 1 CM. - DUCTAL CARCINOMA IN SITU, LOW GRADE. - RESECTION MARGINS ARE NEGATIVE FOR INVASIVE CARCINOMA. - DUCTAL CARCINOMA IN SITU COMES TO WITHIN 0.3 CM OF THE POSTERIOR MARGIN. - BIOPSY SITE. - SEE ONCOLOGY TABLE. 2. Lymph node, sentinel, biopsy, right axillary #1 - ONE OF ONE LYMPH NODES NEGATIVE FOR CARCINOMA (0/1). 3. Lymph node, sentinel, biopsy, right axillary #2 - ONE OF ONE LYMPH NODES NEGATIVE FOR CARCINOMA (0/1). 4. Lymph node, sentinel, biopsy, right axillary #3 - ONE OF ONE LYMPH NODES NEGATIVE FOR CARCINOMA (0/1).  ONCOTYPE DX: RS 20, which predicts a year risk of distant recurrence with tamoxifen alone 14%   Diagnosis 07/29/2016 Liver, needle/core biopsy, posterior of right lobe METASTATIC ADENOCARCINOMA, CONSISTENT WITH COLONIC PRIMARY.     RADIOGRAPHIC STUDIES: I have personally reviewed the radiological images as listed and agreed with the findings in the report. No results found.   ASSESSMENT & PLAN: Veronica Reyes y.o.female  1. Colon Cancer, Stage I (pT2N0), MMR normal, oligo recurrent disease in liver in 12/2014, KRAS mutation (+), liver and lung recurrence in 06/2016 -Veronica Reyes appears stable.  She restarted Irinotecan/Avastin on 01/08/2018, status post 1 cycle.  She tolerated well other than extreme fatigue, which is  likely compounded by additional outside stress related to her husband's recent cancer diagnosis.  She agrees to restart treatment today, labs reviewed.  CBC is stable, CMP with improved LFTs.  Urine protein is negative.  CEA trending up, today's result is pending.  Proceed with cycle 2 irinotecan/Avastin; the patient was seen with Dr. Burr Medico who reviewed the current treatment plan.  Will further reduce irinotecan for increased fatigue and low performance status. I refilled decadron for short course of steroids after chemo to improve fatigue. She agrees to continue every 2 weeks.  F/u in 2 weeks with Dr. Burr Medico prior to next cycle.  2. Right breast cancer, pT1bN0M0, stage Ia, ER+/PR+/HER2-, History of left Breast Cancer, diagnosed on 02/01/2015 -Status post bilateral mastectomy -Bilateral mammogram on 12/16/2017 without evidence of malignancy in either breast  3. H/o PE (01/06/14) -Compliant with Xarelto  4. Uterine fibroids   5. Right kidney lesion   6. Right flank pain, knee discomfort  -Right flank pain is stable to somewhat improved, takes approximately 2 Tylenol No. 4 per day as needed.  I refilled today  7. Morbid obesity  -Weight and appetite are stable on chemotherapy   8. HTN -Has been without PCP for some time, next appointment with [facility] health provider June/2019.  I encouraged her to keep that appointment. BP remains elevated but somewhat improved from previous 145/87; likely related to Avastin.  I refilled metoprolol and HCTZ today  9.  Hypokalemia  -K level fluctuates, potassium is normal today 3.5; continue 1 tablet BID  10. Heart palpitation -She developed intermittent "flutter" 3 weeks ago, otherwise asymptomatic. Might be related to anxiety. VSS, electrolytes are normal. I suggest she monitor the episodes at home to note surrounding events and associated symptoms. She agrees. HR and rhythm is normal on my exam; EKG today in clinic is normal, NSR. Will monitor.   PLAN -Labs  reviewed, proceed with cycle 2 irinotecan/avastin, dose-reduce irinotecan -EKG in clinic, NSR -Refilled HCTZ, metoprolol, ambien, tylenol #4, decadron  -Decadron 1 tab daily x3 days after chemo -F/u in 2 weeks with Dr. Burr Medico prior to next cycle    Orders Placed This Encounter  Procedures  . EKG 12-Lead    Standing Status:   Standing    Number of Occurrences:   1    Order Specific Question:   Reason for Exam    Answer:   heart "flutter"   All questions were answered. The patient knows to call the clinic with any problems, questions or concerns. No barriers to learning was detected.     Alla Feeling, NP 02/05/18   Addendum  I have seen the patient, examined her. I agree with the assessment and and plan and have edited the notes.   Veronica Reyes returns for f/u and chemo.  She had missed or postponed chemotherapy a few times, due to her husband's recent surgery.  She is quite fatigued, CEA has been trending up. I am concerned that she may have disease progression due to the missing treatments.  She agrees to return every 2 weeks for treatment in the future.  Lab reviewed, adequate for treatment, due to her fatigue, I will slightly decrease her intake and dose, continue Avastin. Plan to repeat scan after 3-4 more cycles chemo.   [name]  02/05/2018

## 2018-02-09 ENCOUNTER — Telehealth: Payer: Self-pay | Admitting: Nurse Practitioner

## 2018-02-09 NOTE — Telephone Encounter (Signed)
Appointments scheduled patient will pick up new schedule at next visit per 5/24 los

## 2018-02-17 NOTE — Progress Notes (Signed)
[place]  [phone]) [phone] Fax:(336) [phone]  Clinic Follow Up Note   DATE OF VISIT: 02/19/2018   Veronica Docker, MD [address] [place] [address]  DIAGNOSIS: recurrent Cancer of sigmoid colon ([place]) - Plan: acetaminophen-codeine (TYLENOL #4) 300-60 MG tablet  Malignant neoplasm of lower-outer quadrant of right breast of female, estrogen receptor positive ([place])  Personal history of venous thrombosis and embolism  Obesity, Class III, BMI 40-49.9 (morbid obesity) ([place])  Essential hypertension, benign  PROBLEM LIST 1. Left breast DCIS diagnosed in 2006 and 2010, status post lumpectomy. 2. pT2N0M0 stage I colon cancer, s/p partial colectomy on 03/09/2014  3. PE in 12/2013 when she was diagnosed with colon cancer. 4. 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 [facility])   Staging form: Breast, AJCC 7th Edition     Clinical stage from 02/01/2015: Stage IA (T1a, N0, M0) - Signed by [name], MD on 06/04/2015     Pathologic stage from 11/10/2015: Stage IA (T1b, N0, M0) - Signed by [name], MD on 12/08/2015 Cancer of sigmoid colon [facility])   Staging form: Colon and Rectum, AJCC 7th Edition     Clinical: Stage I (T2, N0, M0) - Unsigned     Breast cancer of lower-outer quadrant of right female breast ([place])   2006 Cancer Diagnosis    Left breast DCIS diagnosed in 2006 and 2010, status post lumpectomy.      01/07/2014 Initial Diagnosis    Breast cancer      01/18/2015 Mammogram    75m mass in lower outer quadrant of right breast       02/01/2015 Initial Biopsy    (R) breast mass biopsy showed invasive ductal carcinoma & DCIS, grade 1-2.       02/01/2015 Receptors her2    ER 90%+, PR 10%+, Ki67 10%, HER2 negative (ratio 1.09)       11/07/2015 Surgery    (R) breast lumpectomy and sentinel lymph node biopsy ([name]      11/07/2015 Pathology Results      (R) breast lumpectomy showed invasive ductal carcinoma, grade 2, 1 cm, low-grade DCIS, negative margins, 3 sentinel lymph nodes were negative. LVI (-). HER2 repeated and remains negative (ratio 1.29).        11/07/2015 Pathologic Stage    pT1b,pN0: Stage IA       11/07/2015 Oncotype testing    RS 22, which predicts a year risk of distant recurrence with tamoxifen alone 14% (intermediate-risk). Adjuvant chemo was not offered      02/21/2016 - 04/10/2016 Radiation Therapy    Adjuvant breast radiation. ([name]). Right breast: 50.4 Gy in 28 fractions.  Right breast boost: 12 Gy in 6 fractions.      05/2016 -  Anti-estrogen oral therapy    Femara (Letrozole) 2.5 mg daily.       10/17/2016 Mammogram    MM DIAG BREAST TOMO BILATERAL 10/17/2016 FINDINGS: There are bilateral lumpectomy changes in the upper central left breast and upper central right breast. No mass, nonsurgical distortion, or suspicious microcalcification is identified in either breast to suggest malignancy. Mammographic images were processed with CAD. IMPRESSION: No evidence of malignancy in either breast. Lumpectomy changes bilaterally. RECOMMENDATION: Diagnostic mammogram is suggested in 1 year. (Code:DM-B-01Y)      12/16/2017 Mammogram    IMPRESSION: No mammographic evidence of malignancy in either breast, status post bilateral mastectomies. RECOMMENDATION:  Diagnostic mammogram is suggested in 1 year.        Cancer of sigmoid colon ([place])   03/04/2014 Initial Diagnosis    Cancer of sigmoid colon      03/09/2014 Pathology Results    G1 invasive adenocarcinoma, no lymph-Vascular invasion or Peri-neural invasion: Absent. MMR normal, MSI stable.       03/09/2014 Surgery    partial colectomy, negative margins.       11/10/2014 Relapse/Recurrence    biopsy confirmed oligo liver recurrence       11/10/2014 Imaging    PET showed two small ares of hypermetabolism in the right lobe of the liver, no other distant  metastasis.       11/30/2014 Imaging    abdomen MRI showed a new enhancing lesion which corresponds to an area of abnormal hyper metabolism on recent PET-CT. Stable enlarged portal caval lymph node.       01/02/2015 Pathology Results    Liver biopsy showed metastatic adenocarcinoma, IHC (+) for CK20, CDX-2, (-) for TTF-1 and ER      01/02/2015 Miscellaneous    liver biopsy KRAS mutation (+)       02/14/2015 Imaging    CT showed:  the small metastatic right hepatic lobe liver lesion is not identified for certain on this examination. No new lesions. Stable small cysts in segment 4 a.       03/05/2015 - 04/16/2015 Chemotherapy    CAPEOX (capecitabine 2500 mg twice daily Day 1-14, oxaliplatin 1 30 mg/m on day 1, every 21 days, S/P 3 cycles       08/24/2015 Procedure    CT-guided oligo liver metastasis microwave ablation by Dr. [name]      06/17/2016 Imaging    CT of the chest/abd/pelvis with contrast 1. Several new small pulmonary nodules worrisome for metastatic disease. 2. New large area of probable infiltrating recurrent tumor in the right hepatic lobe surrounding the prior ablation site. 3. Small supraclavicular lymph nodes.  Attention on future studies. 4. Stable surgical changes involving both breasts. No breast masses are identified. 5. Stable bilateral adrenal gland nodules. 6. Stable upper abdominal and right-sided retroperitoneal lymph nodes.      07/08/2016 PET scan    1. Large mass in the right lobe of the liver demonstrates diffuse hypermetabolism, compatible with recurrent metastatic disease in the liver 2. Multiple small pulmonary nodules are very similar to the recent Chest CT 06/17/16.  3. No other findings of metastatic disease elsewhere in the neck, chest, abdomen, or pelvis. 4. There is some low-level hypermetabolic activity adjacent to the surgical clips in the lateral aspect of the right breast at the site of prior lumpectomy.       07/29/2016 Pathology  Results    Liver biopsy confirmed metastatic colon cancer       07/31/2016 - 07/31/2017 Chemotherapy    Chemotherapy FOLFIRI and Avastin (from cycle 2) and Leucovorin (added from cycle 8) every 2 weeks, started on 07/31/2016, multiple delay per pt's request; chemo break from 5/30-6/21/18; decreased leucovorin to 2000 mg/m2, irinotecan to 115m/m2, 5-fu 20017mm2 from 03/05/17, due to poor tolerance. Started chemo break since 07/31/17.       10/23/2016 Imaging    CT ABDOMEN PELVIS W CONTRAST 10/24/16 IMPRESSION: 1. The numerous tiny bilateral pulmonary nodules seen on the previous imaging exams are no longer evident, suggesting response of therapy. 2. Large ill-defined irregular right hepatic lesion measures smaller on today's study. 3. Slight increase in size of hepatoduodenal ligament lymphadenopathy.  4. Abdominal Aortic Atherosclerois (ICD10-170.0)      01/05/2017 Imaging    CT CAP IMPRESSION: 1. Left lower lobe pulmonary nodule and hepatic metastatic disease are stable. 2. Hepatoduodenal ligament lymph node is stable. 3. Aortic atherosclerosis (ICD10-170.0). Coronary artery calcification. 4. Enlarged pulmonary arteries, indicative of pulmonary arterial hypertension. 5. Small bilateral adrenal nodules are unchanged.      05/09/2017 Imaging    CT CAP IMPRESSION: 1. Stable appearance left lower lobe pulmonary nodule with an apparent new tiny posterior right upper lobe pulmonary nodule. 2. Dominant ill-defined right hepatic mass measures smaller on today's study. 3. Slight decrease in the hepatoduodenal ligament lymph node. 4. Aortic Atherosclerois (ICD10-170.0)      05/09/2017 Imaging    CT CAP 05/09/17 IMPRESSION: 1. Stable appearance left lower lobe pulmonary nodule with an apparent new tiny posterior right upper lobe pulmonary nodule. Continued attention on follow-up recommended. 2. Dominant ill-defined right hepatic mass measures smaller on today's study. 3. Slight decrease  in the hepatoduodenal ligament lymph node. 4.  Aortic Atherosclerois (ICD10-170.0)       08/11/2017 Imaging    CT CAP W Contrast 08/11/17 IMPRESSION: 1. Essentially stable appearance of small pulmonary nodules, hypodense mass laterally in the right hepatic lobe, and mild portacaval adenopathy. No new or progressive lesions. 2. Other imaging findings of potential clinical significance: Aortic Atherosclerosis (ICD10-I70.0). Lower lumbar impingement due to spondylosis and degenerative disc disease. Uterine fibroids. Bilateral renal cysts. Coronary atherosclerosis. Mild mitral valve calcification. Mild cardiomegaly.        09/24/2017 -  Chemotherapy    Maintenance Xeloda 2500 mg twice daily days 1 through 14 of each cycle of treatment and Avastin given every 3 weeks starting on 09/24/2017.       12/02/2017 Imaging    CT CAP W Contrast 12/02/17 IMPRESSION: Chest Impression: 1. Interval increase in size of bilateral small pulmonary nodules. 2. No mediastinal adenopathy  Abdomen / Pelvis Impression: 1. Dominant lesion in the RIGHT hepatic lobe is similar; however there are 2 adjacent low-density lesions in the RIGHT hepatic lobe which are not appreciated on comparison exam and concerning recurrent hepatic metastasis. 2. Interval increase in size periaortic retroperitoneal lymph nodes consistent with recurrent metastatic adenopathy.      CURRENT TREATMENT:   1. Letrozole 2.5 mg once daily, started in September 2017 2. Irinotecan plus Avastin every 2 weeks started on 01/08/18   INTERIM HISTORY:   Veronica Reyes 62 y.o. female with a history of recurrent left breast cancer, colon cancer s/p laparoscopic-assisted partial colectomy (03/09/2014), PE (01/06/2014), and right breast cancer (01/2015), presents to clinic for follow up. She presents to the clinic today by herself. She notes that she is doing well overall and she feel much better. She notes that her right side pain  has alleviated tremendously. She takes her dexamethasone every other day. She is now taking vitamins and she will be starting on her letrozole again.   Since her last visit to the office, she underwent a diagnostic mammogram on 12/16/2017 with results of: No mammographic evidence of malignancy in either breast, status post bilateral mastectomies.   On review of systems, she reports that she will start walking more in the future with her daughter. She will also be looking into joining the [employer] to go to the pool. She has a 68 month old grandchild who she takes care of at night. she denies any other symptoms. Pertinent positives are listed and detailed within the above HPI.   MEDICAL HISTORY:  Past Medical History:  Diagnosis Date  . Anxiety   . Breast CA ([place])    radiation and surgery-lt.  . Cancer ([place])    left breast cancer x2  . Hypertension   . Liver lesion   . Personal history of chemotherapy   . Personal history of radiation therapy 2017  . Pulmonary embolism ([place])    "blood clot in lungs" -dx. 4'15 with CT Chest. On Xarelto  . Radiation 01/22/10-03/12/10   left whole breast 4500 cGy, upper aspect boosted to 6120 cGy  . Radiation 02/21/16 - 04/10/16   right breast 50.4 Gy, right breast boost 12 Gy  . Shortness of breath    sob with exertion. dx. with Pulmonary emboli 4'15 ,tx. Xarelto,Lovenox used for [employer].    ALLERGIES:  is allergic to tramadol.  MEDICATIONS:  Allergies as of 02/19/2018      Reactions   Tramadol Other (See Comments)   Dizziness      Medication List        Accurate as of 02/19/18 10:18 PM. Always use your most recent med list.          acetaminophen-codeine 300-60 MG tablet Commonly known as:  TYLENOL #4 Take 1 tablet by mouth every 8 (eight) hours as needed. for pain   amLODipine 5 MG tablet Commonly known as:  NORVASC Take 1 tablet (5 mg total) by mouth daily.   dexamethasone 4 MG tablet Commonly known as:  DECADRON Take 1 tablet (4 mg total) by  mouth daily.   ferrous sulfate 325 (65 FE) MG tablet Take 325 mg by mouth daily with breakfast. Pt takes  4 times per week.   hydrochlorothiazide 25 MG tablet Commonly known as:  HYDRODIURIL Take 1 tablet (25 mg total) by mouth every morning.   letrozole 2.5 MG tablet Commonly known as:  FEMARA TAKE 1 TABLET BY MOUTH EVERY DAY   lidocaine-prilocaine cream Commonly known as:  EMLA Apply 1 application topically as needed.   lidocaine-prilocaine cream Commonly known as:  EMLA Apply 1 application topically as needed.   LORazepam 0.5 MG tablet Commonly known as:  ATIVAN Take 1 tablet (0.5 mg total) by mouth every 8 (eight) hours.   magic mouthwash w/lidocaine Soln Take 5 mLs by mouth 4 (four) times daily as needed for mouth pain.   metoprolol tartrate 50 MG tablet Commonly known as:  LOPRESSOR Take 1 tablet (50 mg total) by mouth 2 (two) times daily.   ondansetron 8 MG tablet Commonly known as:  ZOFRAN Take 1 tablet (8 mg total) by mouth 2 (two) times daily as needed for refractory nausea / vomiting. Start on day 3 after chemotherapy.   potassium chloride SA 20 MEQ tablet Commonly known as:  K-DUR,KLOR-CON TAKE 1 TABLET BY MOUTH 2 TIMES DAILY   prochlorperazine 10 MG tablet Commonly known as:  COMPAZINE Take 1 tablet (10 mg total) by mouth every 6 (six) hours as needed (NAUSEA).   promethazine 25 MG tablet Commonly known as:  PHENERGAN Take 1 tablet (25 mg total) by mouth every 6 (six) hours as needed for nausea or vomiting.   XARELTO 20 MG Tabs tablet Generic drug:  rivaroxaban TAKE 1 TABLET BY MOUTH EVERY DAY   zolpidem 5 MG tablet Commonly known as:  AMBIEN Take 1 tablet (5 mg total) by mouth at bedtime as needed for sleep.       SURGICAL HISTORY:  Past Surgical History:  Procedure Laterality Date  . BREAST BIOPSY Left 2010  malignant  . BREAST BIOPSY Right 01/2015   malignant  . BREAST LUMPECTOMY Left 2006  . BREAST LUMPECTOMY Left 2010  . BREAST  LUMPECTOMY Right 11/07/2015  . BREAST LUMPECTOMY WITH RADIOACTIVE SEED AND SENTINEL LYMPH NODE BIOPSY Right 11/07/2015   Procedure: BREAST LUMPECTOMY WITH RADIOACTIVE SEED AND SENTINEL LYMPH NODE BIOPSY;  Surgeon: Stark Klein, MD;  Location: [facility];  Service: General;  Laterality: Right;  . BREAST SURGERY     '11- Left breast lumpectomy  . CESAREAN SECTION    . CHOLECYSTECTOMY     Laparoscopic 20 yrs ago  . COLON SURGERY  03-09-14   partial colectomy for cancer  . IR GENERIC HISTORICAL  07/29/2016   IR FLUORO GUIDE PORT INSERTION RIGHT 07/29/2016 Sandi Mariscal, MD [facility]  . IR GENERIC HISTORICAL  07/29/2016   IR US GUIDE VASC ACCESS RIGHT 07/29/2016 Sandi Mariscal, MD [facility]  . IR GENERIC HISTORICAL  07/29/2016   IR US GUIDE BX ASP/DRAIN 07/29/2016 [facility]  . PARTIAL COLECTOMY N/A 03/09/2014   Procedure: LAPAROSCOPIC ASSISTED PARTIAL COLECTOMY, splenic flexure mobilization;  Surgeon: Leighton Ruff, MD;  Location: [facility];  Service: General;  Laterality: N/A;  . RADIOFREQUENCY ABLATION Right 08/24/2015   Procedure: MICROWAVE ABLATION RIGHT LIVER LOBE ;  Surgeon: Corrie Mckusick, DO;  Location: [facility];  Service: Anesthesiology;  Laterality: Right;    REVIEW OF SYSTEMS:  Constitutional: Denies fevers, chills or abnormal weight loss (+) mild fatigued (+) weight gain Eyes: Denies blurriness of vision Ears, nose, mouth, throat, and face: Denies mucositis.  Respiratory: Denies cough, dyspnea or wheezes Cardiovascular: Denies palpitation, chest discomfort or lower extremity swelling Gastrointestinal:  Denies heartburn or change in bowel habits.  Skin: Denies abnormal skin rashes Musculoskeletal: Denies concerning joint pains.  Lymphatics: Denies new lymphadenopathy or easy bruising Neurological:Denies numbness, tingling or new weaknesses Musculoskeletal: (+) left knee discomfort  Behavioral/Psych: Mood is stable, no new changes  All other systems were reviewed with  the patient and are negative.    PHYSICAL EXAMINATION:   ECOG PERFORMANCE STATUS: 1  Blood pressure (!) 150/105, pulse 86, temperature 98.8 F (37.1 C), resp. rate 18, last menstrual period 01/23/2014, SpO2 98 %.  Vitals:   02/19/18 1123  BP: (!) 150/105  Pulse: 86  Resp: 18  Temp: 98.8 F (37.1 C)  SpO2: 98%    GENERAL:alert, no distress and comfortable; well developed and obese. Easily mobile to exam table.  SKIN: skin color, texture, turgor are normal, no rashes or significant lesions EYES: normal, Conjunctiva are pink and non-injected, sclera clear OROPHARYNX:no exudate, no erythema and lips, buccal mucosa, and tongue normal  NECK: supple, thyroid normal size, non-tender, without nodularity LYMPH:  no palpable lymphadenopathy in the cervical, axillary or supraclavicular LUNGS: clear to auscultation with normal breathing effort, no wheezes or rhonchi HEART: regular rate & rhythm and no murmurs and no lower extremity edema ABDOMEN:abdomen soft, non-tender and normal bowel sounds; incision healing with signs of infection.  Mild hepatomegaly with tenderness at the RUQ, no abdominal tenderness. No local megaly Musculoskeletal:no cyanosis of digits and no clubbing, limited ROM of left shoulder  NEURO: alert & oriented x 3 with fluent speech, no focal motor/sensory deficits  Labs:  CBC Latest Ref Rng & Units 02/19/2018 02/05/2018 01/08/2018  WBC 3.9 - 10.3 K/uL 11.9(H) 10.2 12.3(H)  Hemoglobin 11.6 - 15.9 g/dL 12.7 13.1 14.2  Hematocrit 34.8 - 46.6 % 38.2 39.3 42.2  Platelets 145 - 400 K/uL 341 286 307  CMP Latest Ref Rng & Units 02/19/2018 02/05/2018 01/08/2018  Glucose 70 - 140 mg/dL 109 137 127  BUN 7 - 26 mg/dL '12 12 18  ' Creatinine 0.60 - 1.10 mg/dL 0.79 0.77 0.79  Sodium 136 - 145 mmol/L 139 141 136  Potassium 3.5 - 5.1 mmol/L 3.6 3.5 3.2(L)  Chloride 98 - 109 mmol/L 104 106 103  CO2 22 - 29 mmol/L '25 24 23  ' Calcium 8.4 - 10.4 mg/dL 9.7 9.7 9.8  Total Protein 6.4 - 8.3 g/dL  7.7 7.8 8.6(H)  Total Bilirubin 0.2 - 1.2 mg/dL 0.5 0.5 0.7  Alkaline Phos 40 - 150 U/L 112 186(H) 196(H)  AST 5 - 34 U/L 26 97(H) 106(H)  ALT 0 - 55 U/L 27 90(H) 108(H)    CEA  01/18/2014: 0.8 03/01/2015: 1.3 01/31/2016: 4.8 05/28/2016: 48 07/31/2016: 173.7 08/28/16: 139 09/25/2016: 43 11/04/16: 9.94 12/18/2016: 6.99 01/29/17: 4.58 03/04/17: 6.09 04/09/17: 4.22 05/07/17: 5.29 06/04/17: 4.12 07/02/17: 6.78 07/30/17 : 5.35  09/24/17: 5.41 10/23/17: 8.03 11/13/17: 12.32 01/01/2018: 27.70 02/05/2018: 39.93  PATHOLOGY RESULT Diagnosis 01/02/2015 Liver, needle/core biopsy, right - POSITIVE FOR METASTATIC ADENOCARCINOMA. - SEE COMMENT.   Diagnosis 11/07/2015 1. Breast, lumpectomy, Right - INVASIVE DUCTAL CARCINOMA, GRADE 2, SPANNING 1 CM. - DUCTAL CARCINOMA IN SITU, LOW GRADE. - RESECTION MARGINS ARE NEGATIVE FOR INVASIVE CARCINOMA. - DUCTAL CARCINOMA IN SITU COMES TO WITHIN 0.3 CM OF THE POSTERIOR MARGIN. - BIOPSY SITE. - SEE ONCOLOGY TABLE. 2. Lymph node, sentinel, biopsy, right axillary #1 - ONE OF ONE LYMPH NODES NEGATIVE FOR CARCINOMA (0/1). 3. Lymph node, sentinel, biopsy, right axillary #2 - ONE OF ONE LYMPH NODES NEGATIVE FOR CARCINOMA (0/1). 4. Lymph node, sentinel, biopsy, right axillary #3 - ONE OF ONE LYMPH NODES NEGATIVE FOR CARCINOMA (0/1).  ONCOTYPE DX: RS 44, which predicts a year risk of distant recurrence with tamoxifen alone 14%   Diagnosis 07/29/2016 Liver, needle/core biopsy, posterior of right lobe METASTATIC ADENOCARCINOMA, CONSISTENT WITH COLONIC PRIMARY.  RADIOLOGY STUDIES   PET 07/08/2016 IMPRESSION: 1. Large mass in the right lobe of the liver demonstrates diffuse hypermetabolism, compatible with recurrent metastatic disease in the liver. 2. Multiple small pulmonary nodules are very similar to the recent chest CT 06/17/2016. These do not demonstrate hypermetabolism on the PET images, however, these lesions are well below the level of PET imaging.  Given that these nodules are new compared to prior chest CT 12/18/2015, they are concerning for potential metastatic disease to the lungs, and continued attention on followup studies is recommended. 3. No other findings of metastatic disease elsewhere in the neck, chest, abdomen or pelvis. 4. There is some low-level hypermetabolic activity adjacent to the surgical clips in the lateral aspect of the right breast at the site of prior lumpectomy. This area has undergone interval involution over the several prior examinations, and this low-level activity is favored to be related to normal healing. Attention on follow-up imaging is recommended to ensure the stability or resolution of this finding. 5. Aortic atherosclerosis, in addition to 2 vessel coronary artery disease. Please note that although the presence of coronary artery calcium documents the presence of coronary artery disease, the severity of this disease and any potential stenosis cannot be assessed on this non-gated CT examination. Assessment for potential risk factor modification, dietary therapy or pharmacologic therapy may be warranted, if clinically indicated. 6. Additional incidental findings, as above.  MM DIAG BREAST TOMO BILATERAL 10/17/2016 FINDINGS: There are bilateral lumpectomy changes in the upper central left breast and upper central  right breast. No mass, nonsurgical distortion, or suspicious microcalcification is identified in either breast to suggest malignancy. Mammographic images were processed with CAD. IMPRESSION: No evidence of malignancy in either breast. Lumpectomy changes bilaterally. RECOMMENDATION: Diagnostic mammogram is suggested in 1 year. (Code:DM-B-01Y)  CT CHEST, ABDOMEN PELVIS W CONTRAST 10/24/16 IMPRESSION: 1. The numerous tiny bilateral pulmonary nodules seen on the previous imaging exams are no longer evident, suggesting response of therapy. 2. Large ill-defined irregular right hepatic lesion  measures smaller on today's study. 3. Slight increase in size of hepatoduodenal ligament lymphadenopathy. 4. Abdominal Aortic Atherosclerois (ICD10-170.0)  CT CAP 01/05/17 IMPRESSION: 1. Left lower lobe pulmonary nodule and hepatic metastatic disease are stable. 2. Hepatoduodenal ligament lymph node is stable. 3. Aortic atherosclerosis (ICD10-170.0). Coronary artery calcification. 4. Enlarged pulmonary arteries, indicative of pulmonary arterial hypertension. 5. Small bilateral adrenal nodules are unchanged.  CT CAP 05/09/17 IMPRESSION: 1. Stable appearance left lower lobe pulmonary nodule with an apparent new tiny posterior right upper lobe pulmonary nodule. Continued attention on follow-up recommended. 2. Dominant ill-defined right hepatic mass measures smaller on today's study. 3. Slight decrease in the hepatoduodenal ligament lymph node. 4.  Aortic Atherosclerois (ICD10-170.0)   CT CAP W Contrast 08/11/17 IMPRESSION: 1. Essentially stable appearance of small pulmonary nodules, hypodense mass laterally in the right hepatic lobe, and mild portacaval adenopathy. No new or progressive lesions. 2. Other imaging findings of potential clinical significance: Aortic Atherosclerosis (ICD10-I70.0). Lower lumbar impingement due to spondylosis and degenerative disc disease. Uterine fibroids. Bilateral renal cysts. Coronary atherosclerosis. Mild mitral valve calcification. Mild cardiomegaly.   CT CAP W Contrast 12/02/17 IMPRESSION: Chest Impression: 1. Interval increase in size of bilateral small pulmonary nodules. 2. No mediastinal adenopathy  Abdomen / Pelvis Impression: 1. Dominant lesion in the RIGHT hepatic lobe is similar; however there are 2 adjacent low-density lesions in the RIGHT hepatic lobe which are not appreciated on comparison exam and concerning recurrent hepatic metastasis. 2. Interval increase in size periaortic retroperitoneal lymph nodes consistent with recurrent  metastatic adenopathy.  MM DIAG BREAST, 12/16/2017 IMPRESSION: No mammographic evidence of malignancy in either breast, status post bilateral mastectomies.  ASSESSMENT: Veronica Reyes 62 y.o. female   1. Colon Cancer, Stage I (pT2N0), MMR normal, oligo recurrent disease in liver in 12/2014, KRAS mutation (+), liver and lung recurrence in 06/2016 -I previously reviewed the nature history of metastatic colon cancer, and the potential curative approach with chemotherapy followed by liver lesion ablation or resection. However, more likely, this is not curable disease. -She initially received 3 cycles of Capox, did not want to proceed with fourth cycle due to the moderate side effects from previous chemotherapy treatment. She declined liver mass resection, and underwent liver ablation by interventional radiologist Dr. Earleen Newport in 08/2015. - I previously reviewed her restaging CT scan from 06/17/2016, which unfortunately showed new area of infiltrative disease as the previous ablated liver lesion, likely recurrence. She also developed several new pulmonary nodules, small, concerning for metastatic disease. 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 -She started chemotherapy FOLFIRI and Avastin on 08/13/16. Inially she tolerated well but after many cycles she did develop side effects. I previously had to decrease her chemo dose a few times or pt requested a break several times for multiple reasons. Last FOLFIRI chemo was on 07/30/17 (cycle 18). -She did have a good response to chemo, her CEA significantly decreased. CT scan from 05/09/2017 showed continuous response to treatment, her liver metastasis slightly decreased  in size. After CT CAP from 08/11/17 which showed stable controlled disease, I was able to switch her to maintenance therapy. She started Xeloda with Avastin every 3 weeks on 09/24/17.  -CT CAP W Contrast from 12/02/17  reveals interval increase in size of bilateral small pulmonary nodules and two new lesions in the right hepatic lobe, although her previous liver metastasis in the right lobe has decreased in size.  -Her treatment was subsequently changed to irinotecan and avastin. Pt declined 5-fu pump  -She was not very compliant with treatment, due to her trips, her husband surgery etc. -Her abdominal pain has significantly improved since her last cycle chemo, he is clinically doing better.  She is agreeable to continue treatment every 2 weeks.  -proceed with cycle 3 of Avastin and Irinotecan today -Labs reviewed with patient today. -plan to repeat after cycle 5 or 6    2. Right breast cancer, [id number], stage Ia, ER+/PR+/HER2-, History of left Breast Cancer, diagnosed on 02/01/2015 --Left CIS in situ, s/p lumpectomy in 2006 and 2011 by Dr. Margot Chimes, and radiation in 2011 by Dr. Sondra Come.  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 Surgical margins were negative. -I previously reviewed her Oncotype DX result. Her recurrence score is 22, which predicts 14% 10 year risk of distant recurrence with tamoxifen alone. This is intermediate risk, based on her relatively low number, I do not recommend adjuvant chemotherapy. -She has completed adjuvant breast radiation with Dr. Sondra Come in July 2017.  -her [facility] level supported she is postmenopausal -She started adjuvant letrozole in 05/2016, tolerating well, we'll continue. -We previously discussed breast cancer surveillance, I strongly encouraged her to continue annual screening mammogram, self exam, and follow-up with [place] routinely. - Mammogram from 10/2016 showed no evidence for malignancy. There is no clinical concern for breast cancer recurrence. -diagnostic mammogram on 12/16/2017 showed: No mammographic evidence of malignancy in either breast, status post bilateral mastectomies.   3.  History of PE (01/06/2014) --She has a diagnosis of pulmonary embolism, likely secondary to malignancy in the setting of family history for stroke (and possible stroke history in patient as well). Lower extremity dopplers negative.  -continue Xarelto. Due to her metastatic colon cancer, I previously recommended her to continue indefinitely.  4.  Uterine Fibroids  --Negative endometrial biopsy. Following with Gynecology.  5. Kidney lesion (Right). -No hypermetabolic right kidney lesion on the pet scan -Repeat abdominal MRI 05/29/2015 showed unchanged right renal lesion, favored to represent a complex/septate cyst.  6. Right flank pain, Knee discomfort - possible related to her liver metastasis -improved with chemo -Continue Tylenol No. 4 as needed for pain. Her abdominal pain overall has improved lately. -Controled with tylenol #4, refilled today (07/30/17) -I suggest she see a orthopedist for her knee issues.  -Pt requested 40 tablets of Tylenol #4 instead of 70 today, I refilled for her  7.  Morbid obesity - I have previously encouraged her to eat healthy and exercise more.  8. HTN  -She will continue medication and following up with her PCP -We discussed that Avastin can increase her blood pressure, I recommend her to continue monitoring at home -She was mildly hypertensive on 07/16/17, we will  increase her dose of Amlodipine to 73m if needed in the future.  -filled Amlodipine and HCTZ today, she is looking for new PCP   9. Hypokalemia  - she ran out of oral K recently, K down to  3.2 on 2/8; she restarted 1 tablet BID. K 3.8 on 3/1; given intermittent diarrhea she will continue oral K supplement for now.   Plan  -Lab reviewed, adequate for treatment, will proceed cycle 3 irinotecan and Avastin today -Continue treatment every 2 weeks, will see her back in 2 weeks   All questions were answered. The patient knows to call the clinic with any problems, questions or concerns. We can  certainly see the patient much sooner if necessary.  I spent 20 minutes counseling the patient face to face. The total time spent in the appointment was 25 minutes.  I, Soijett Blue am acting as scribe for Dr. [name].  I have reviewed the above documentation for accuracy and completeness, and I agree with the above.   [name]  02/19/2018

## 2018-02-19 ENCOUNTER — Inpatient Hospital Stay: Payer: BLUE CROSS/BLUE SHIELD | Attending: Hematology

## 2018-02-19 ENCOUNTER — Other Ambulatory Visit: Payer: Self-pay | Admitting: Nurse Practitioner

## 2018-02-19 ENCOUNTER — Inpatient Hospital Stay (HOSPITAL_BASED_OUTPATIENT_CLINIC_OR_DEPARTMENT_OTHER): Payer: BLUE CROSS/BLUE SHIELD | Admitting: Hematology

## 2018-02-19 ENCOUNTER — Encounter: Payer: Self-pay | Admitting: Hematology

## 2018-02-19 ENCOUNTER — Inpatient Hospital Stay: Payer: BLUE CROSS/BLUE SHIELD

## 2018-02-19 ENCOUNTER — Telehealth: Payer: Self-pay | Admitting: Hematology

## 2018-02-19 VITALS — BP 150/105 | HR 86 | Temp 98.8°F | Resp 18

## 2018-02-19 VITALS — BP 117/73 | HR 92 | Temp 98.5°F | Resp 18

## 2018-02-19 DIAGNOSIS — I700 Atherosclerosis of aorta: Secondary | ICD-10-CM | POA: Insufficient documentation

## 2018-02-19 DIAGNOSIS — E876 Hypokalemia: Secondary | ICD-10-CM

## 2018-02-19 DIAGNOSIS — I10 Essential (primary) hypertension: Secondary | ICD-10-CM

## 2018-02-19 DIAGNOSIS — R109 Unspecified abdominal pain: Secondary | ICD-10-CM | POA: Insufficient documentation

## 2018-02-19 DIAGNOSIS — J209 Acute bronchitis, unspecified: Secondary | ICD-10-CM | POA: Insufficient documentation

## 2018-02-19 DIAGNOSIS — Z86718 Personal history of other venous thrombosis and embolism: Secondary | ICD-10-CM

## 2018-02-19 DIAGNOSIS — Z5112 Encounter for antineoplastic immunotherapy: Secondary | ICD-10-CM | POA: Diagnosis present

## 2018-02-19 DIAGNOSIS — R918 Other nonspecific abnormal finding of lung field: Secondary | ICD-10-CM | POA: Diagnosis not present

## 2018-02-19 DIAGNOSIS — Z170 Estrogen receptor positive status [ER+]: Secondary | ICD-10-CM | POA: Insufficient documentation

## 2018-02-19 DIAGNOSIS — R5383 Other fatigue: Secondary | ICD-10-CM

## 2018-02-19 DIAGNOSIS — C50511 Malignant neoplasm of lower-outer quadrant of right female breast: Secondary | ICD-10-CM

## 2018-02-19 DIAGNOSIS — C187 Malignant neoplasm of sigmoid colon: Secondary | ICD-10-CM

## 2018-02-19 DIAGNOSIS — D259 Leiomyoma of uterus, unspecified: Secondary | ICD-10-CM | POA: Diagnosis not present

## 2018-02-19 DIAGNOSIS — Z79899 Other long term (current) drug therapy: Secondary | ICD-10-CM | POA: Diagnosis not present

## 2018-02-19 DIAGNOSIS — Z86711 Personal history of pulmonary embolism: Secondary | ICD-10-CM | POA: Insufficient documentation

## 2018-02-19 DIAGNOSIS — Z7901 Long term (current) use of anticoagulants: Secondary | ICD-10-CM | POA: Insufficient documentation

## 2018-02-19 DIAGNOSIS — Z95828 Presence of other vascular implants and grafts: Secondary | ICD-10-CM

## 2018-02-19 DIAGNOSIS — N289 Disorder of kidney and ureter, unspecified: Secondary | ICD-10-CM | POA: Insufficient documentation

## 2018-02-19 DIAGNOSIS — Z9049 Acquired absence of other specified parts of digestive tract: Secondary | ICD-10-CM | POA: Diagnosis not present

## 2018-02-19 DIAGNOSIS — C786 Secondary malignant neoplasm of retroperitoneum and peritoneum: Secondary | ICD-10-CM

## 2018-02-19 DIAGNOSIS — Z9221 Personal history of antineoplastic chemotherapy: Secondary | ICD-10-CM | POA: Insufficient documentation

## 2018-02-19 DIAGNOSIS — Z6841 Body Mass Index (BMI) 40.0 and over, adult: Secondary | ICD-10-CM | POA: Diagnosis not present

## 2018-02-19 DIAGNOSIS — M25562 Pain in left knee: Secondary | ICD-10-CM | POA: Insufficient documentation

## 2018-02-19 DIAGNOSIS — M25561 Pain in right knee: Secondary | ICD-10-CM | POA: Insufficient documentation

## 2018-02-19 DIAGNOSIS — C787 Secondary malignant neoplasm of liver and intrahepatic bile duct: Secondary | ICD-10-CM | POA: Diagnosis not present

## 2018-02-19 DIAGNOSIS — Z5111 Encounter for antineoplastic chemotherapy: Secondary | ICD-10-CM | POA: Insufficient documentation

## 2018-02-19 DIAGNOSIS — Z853 Personal history of malignant neoplasm of breast: Secondary | ICD-10-CM | POA: Diagnosis not present

## 2018-02-19 DIAGNOSIS — Z923 Personal history of irradiation: Secondary | ICD-10-CM | POA: Insufficient documentation

## 2018-02-19 LAB — CBC WITH DIFFERENTIAL (CANCER CENTER ONLY)
BASOS ABS: 0.1 10*3/uL (ref 0.0–0.1)
BASOS PCT: 1 %
Eosinophils Absolute: 0 10*3/uL (ref 0.0–0.5)
Eosinophils Relative: 0 %
HEMATOCRIT: 38.2 % (ref 34.8–46.6)
HEMOGLOBIN: 12.7 g/dL (ref 11.6–15.9)
Lymphocytes Relative: 15 %
Lymphs Abs: 1.7 10*3/uL (ref 0.9–3.3)
MCH: 31.6 pg (ref 25.1–34.0)
MCHC: 33.2 g/dL (ref 31.5–36.0)
MCV: 95.4 fL (ref 79.5–101.0)
MONOS PCT: 6 %
Monocytes Absolute: 0.7 10*3/uL (ref 0.1–0.9)
NEUTROS ABS: 9.4 10*3/uL — AB (ref 1.5–6.5)
NEUTROS PCT: 78 %
Platelet Count: 341 10*3/uL (ref 145–400)
RBC: 4 MIL/uL (ref 3.70–5.45)
RDW: 16.4 % — ABNORMAL HIGH (ref 11.2–14.5)
WBC Count: 11.9 10*3/uL — ABNORMAL HIGH (ref 3.9–10.3)

## 2018-02-19 LAB — CMP (CANCER CENTER ONLY)
ALK PHOS: 112 U/L (ref 40–150)
ALT: 27 U/L (ref 0–55)
ANION GAP: 10 (ref 3–11)
AST: 26 U/L (ref 5–34)
Albumin: 2.9 g/dL — ABNORMAL LOW (ref 3.5–5.0)
BUN: 12 mg/dL (ref 7–26)
CALCIUM: 9.7 mg/dL (ref 8.4–10.4)
CO2: 25 mmol/L (ref 22–29)
Chloride: 104 mmol/L (ref 98–109)
Creatinine: 0.79 mg/dL (ref 0.60–1.10)
GFR, Estimated: >60 mL/min (ref >60)
Glucose, Bld: 109 mg/dL (ref 70–140)
Potassium: 3.6 mmol/L (ref 3.5–5.1)
SODIUM: 139 mmol/L (ref 136–145)
Total Bilirubin: 0.5 mg/dL (ref 0.2–1.2)
Total Protein: 7.7 g/dL (ref 6.4–8.3)

## 2018-02-19 MED ORDER — ATROPINE SULFATE 1 MG/ML IJ SOLN
INTRAMUSCULAR | Status: AC
  Filled 2018-02-19: qty 1

## 2018-02-19 MED ORDER — PROCHLORPERAZINE MALEATE 10 MG PO TABS
10.0000 mg | ORAL_TABLET | Freq: Once | ORAL | Status: AC | PRN
  Administered 2018-02-19: 10 mg via ORAL

## 2018-02-19 MED ORDER — IRINOTECAN HCL CHEMO INJECTION 100 MG/5ML
130.0000 mg/m2 | Freq: Once | INTRAVENOUS | Status: AC
  Administered 2018-02-19: 320 mg via INTRAVENOUS
  Filled 2018-02-19: qty 16

## 2018-02-19 MED ORDER — PALONOSETRON HCL INJECTION 0.25 MG/5ML
0.2500 mg | Freq: Once | INTRAVENOUS | Status: AC
  Administered 2018-02-19: 0.25 mg via INTRAVENOUS

## 2018-02-19 MED ORDER — SODIUM CHLORIDE 0.9 % IV SOLN
Freq: Once | INTRAVENOUS | Status: AC
  Administered 2018-02-19: 12:00:00 via INTRAVENOUS

## 2018-02-19 MED ORDER — SODIUM CHLORIDE 0.9% FLUSH
10.0000 mL | INTRAVENOUS | Status: DC | PRN
  Administered 2018-02-19: 10 mL via INTRAVENOUS
  Filled 2018-02-19: qty 10

## 2018-02-19 MED ORDER — ATROPINE SULFATE 1 MG/ML IJ SOLN
0.5000 mg | Freq: Once | INTRAMUSCULAR | Status: AC | PRN
  Administered 2018-02-19: 0.5 mg via INTRAVENOUS

## 2018-02-19 MED ORDER — PROCHLORPERAZINE MALEATE 10 MG PO TABS
ORAL_TABLET | ORAL | Status: AC
  Filled 2018-02-19: qty 1

## 2018-02-19 MED ORDER — SODIUM CHLORIDE 0.9% FLUSH
10.0000 mL | INTRAVENOUS | Status: DC | PRN
  Administered 2018-02-19: 10 mL
  Filled 2018-02-19: qty 10

## 2018-02-19 MED ORDER — PALONOSETRON HCL INJECTION 0.25 MG/5ML
INTRAVENOUS | Status: AC
  Filled 2018-02-19: qty 5

## 2018-02-19 MED ORDER — SODIUM CHLORIDE 0.9 % IV SOLN
600.0000 mg | Freq: Once | INTRAVENOUS | Status: AC
  Administered 2018-02-19: 600 mg via INTRAVENOUS
  Filled 2018-02-19: qty 16

## 2018-02-19 MED ORDER — SODIUM CHLORIDE 0.9 % IV SOLN
Freq: Once | INTRAVENOUS | Status: AC
  Administered 2018-02-19: 13:00:00 via INTRAVENOUS
  Filled 2018-02-19: qty 5

## 2018-02-19 MED ORDER — ACETAMINOPHEN-CODEINE #4 300-60 MG PO TABS: 1.0000 | ORAL_TABLET | Freq: Three times a day (TID) | ORAL | 0 refills | Status: DC | PRN

## 2018-02-19 MED ORDER — HEPARIN SOD (PORK) LOCK FLUSH 100 UNIT/ML IV SOLN
500.0000 [IU] | Freq: Once | INTRAVENOUS | Status: AC | PRN
  Administered 2018-02-19: 500 [IU]
  Filled 2018-02-19: qty 5

## 2018-02-19 NOTE — Patient Instructions (Signed)
[facility] Discharge Instructions for Patients Receiving Chemotherapy  Today you received the following chemotherapy agents Avastin,Irinotecan  To help prevent nausea and vomiting after your treatment, we encourage you to take your nausea medication as directed  If you develop nausea and vomiting that is not controlled by your nausea medication, call the clinic.   BELOW ARE SYMPTOMS THAT SHOULD BE REPORTED IMMEDIATELY:  *FEVER GREATER THAN 100.5 F  *CHILLS WITH OR WITHOUT FEVER  NAUSEA AND VOMITING THAT IS NOT CONTROLLED WITH YOUR NAUSEA MEDICATION  *UNUSUAL SHORTNESS OF BREATH  *UNUSUAL BRUISING OR BLEEDING  TENDERNESS IN MOUTH AND THROAT WITH OR WITHOUT PRESENCE OF ULCERS  *URINARY PROBLEMS  *BOWEL PROBLEMS  UNUSUAL RASH Items with * indicate a potential emergency and should be followed up as soon as possible.  Feel free to call the clinic should you have any questions or concerns. The clinic phone number is [phone].  Please show the CHEMO ALERT CARD at check-in to the Emergency Department and triage nurse.   

## 2018-02-19 NOTE — Telephone Encounter (Signed)
Appointments scheduled AVS/Calendar printed per 6/7 los

## 2018-02-19 NOTE — Progress Notes (Signed)
Per Dr. Burr Medico, ok to treat with BP (150/105, then 162/94) today, will recheck BP during tx and before pt leaves. Have instructed pt to follow up with her PCP; pt verbalizes understanding.

## 2018-03-02 ENCOUNTER — Other Ambulatory Visit: Payer: Self-pay | Admitting: Nurse Practitioner

## 2018-03-05 ENCOUNTER — Inpatient Hospital Stay: Payer: BLUE CROSS/BLUE SHIELD | Admitting: Hematology

## 2018-03-05 ENCOUNTER — Inpatient Hospital Stay: Payer: BLUE CROSS/BLUE SHIELD

## 2018-03-09 ENCOUNTER — Telehealth: Payer: Self-pay | Admitting: Hematology

## 2018-03-09 NOTE — Progress Notes (Signed)
[place]  [phone] [phone]  Clinic Follow Up Note   DATE OF VISIT: 03/12/2018   Veronica Docker, MD [address] [place] [address]  DIAGNOSIS: recurrent Cancer of sigmoid colon [facility])  Malignant neoplasm of lower-outer quadrant of right breast of female, estrogen receptor positive ([place])  Personal history of venous thrombosis and embolism  Obesity, Class III, BMI 40-49.9 (morbid obesity) ([place])  Essential hypertension, benign  PROBLEM LIST 1. Left breast DCIS diagnosed in 2006 and 2010, status post lumpectomy. 2. pT2N0M0 stage I colon cancer, s/p partial colectomy on 03/09/2014  3. PE in 12/2013 when she was diagnosed with colon cancer. 4. 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 [facility])   Staging form: Breast, AJCC 7th Edition     Clinical stage from 02/01/2015: Stage IA (T1a, N0, M0) - Signed by [name], MD on 06/04/2015     Pathologic stage from 11/10/2015: Stage IA (T1b, N0, M0) - Signed by [name], MD on 12/08/2015 Cancer of sigmoid colon [facility])   Staging form: Colon and Rectum, AJCC 7th Edition     Clinical: Stage I (T2, N0, M0) - Unsigned     Breast cancer of lower-outer quadrant of right female breast ([place])   2006 Cancer Diagnosis    Left breast DCIS diagnosed in 2006 and 2010, status post lumpectomy.      01/07/2014 Initial Diagnosis    Breast cancer      01/18/2015 Mammogram    52m mass in lower outer quadrant of right breast       02/01/2015 Initial Biopsy    (R) breast mass biopsy showed invasive ductal carcinoma & DCIS, grade 1-2.       02/01/2015 Receptors her2    ER 90%+, PR 10%+, Ki67 10%, HER2 negative (ratio 1.09)       11/07/2015 Surgery    (R) breast lumpectomy and sentinel lymph node biopsy ([name]      11/07/2015 Pathology Results    (R) breast lumpectomy showed invasive ductal carcinoma,  grade 2, 1 cm, low-grade DCIS, negative margins, 3 sentinel lymph nodes were negative. LVI (-). HER2 repeated and remains negative (ratio 1.29).        11/07/2015 Pathologic Stage    pT1b,pN0: Stage IA       11/07/2015 Oncotype testing    RS 22, which predicts a year risk of distant recurrence with tamoxifen alone 14% (intermediate-risk). Adjuvant chemo was not offered      02/21/2016 - 04/10/2016 Radiation Therapy    Adjuvant breast radiation. ([name]). Right breast: 50.4 Gy in 28 fractions.  Right breast boost: 12 Gy in 6 fractions.      05/2016 -  Anti-estrogen oral therapy    Femara (Letrozole) 2.5 mg daily.       10/17/2016 Mammogram    MM DIAG BREAST TOMO BILATERAL 10/17/2016 FINDINGS: There are bilateral lumpectomy changes in the upper central left breast and upper central right breast. No mass, nonsurgical distortion, or suspicious microcalcification is identified in either breast to suggest malignancy. Mammographic images were processed with CAD. IMPRESSION: No evidence of malignancy in either breast. Lumpectomy changes bilaterally. RECOMMENDATION: Diagnostic mammogram is suggested in 1 year. (Code:DM-B-01Y)      12/16/2017 Mammogram    IMPRESSION: No mammographic evidence of malignancy in either breast, status post bilateral mastectomies. RECOMMENDATION: Diagnostic mammogram is suggested in 1 year.  Cancer of sigmoid colon ([place])   03/04/2014 Initial Diagnosis    Cancer of sigmoid colon      03/09/2014 Pathology Results    G1 invasive adenocarcinoma, no lymph-Vascular invasion or Peri-neural invasion: Absent. MMR normal, MSI stable.       03/09/2014 Surgery    partial colectomy, negative margins.       11/10/2014 Relapse/Recurrence    biopsy confirmed oligo liver recurrence       11/10/2014 Imaging    PET showed two small ares of hypermetabolism in the right lobe of the liver, no other distant metastasis.       11/30/2014 Imaging    abdomen MRI showed a new  enhancing lesion which corresponds to an area of abnormal hyper metabolism on recent PET-CT. Stable enlarged portal caval lymph node.       01/02/2015 Pathology Results    Liver biopsy showed metastatic adenocarcinoma, IHC (+) for CK20, CDX-2, (-) for TTF-1 and ER      01/02/2015 Miscellaneous    liver biopsy KRAS mutation (+)       02/14/2015 Imaging    CT showed:  the small metastatic right hepatic lobe liver lesion is not identified for certain on this examination. No new lesions. Stable small cysts in segment 4 a.       03/05/2015 - 04/16/2015 Chemotherapy    CAPEOX (capecitabine 2500 mg twice daily Day 1-14, oxaliplatin 1 30 mg/m on day 1, every 21 days, S/P 3 cycles       08/24/2015 Procedure    CT-guided oligo liver metastasis microwave ablation by Dr. [name]      06/17/2016 Imaging    CT of the chest/abd/pelvis with contrast 1. Several new small pulmonary nodules worrisome for metastatic disease. 2. New large area of probable infiltrating recurrent tumor in the right hepatic lobe surrounding the prior ablation site. 3. Small supraclavicular lymph nodes.  Attention on future studies. 4. Stable surgical changes involving both breasts. No breast masses are identified. 5. Stable bilateral adrenal gland nodules. 6. Stable upper abdominal and right-sided retroperitoneal lymph nodes.      07/08/2016 PET scan    1. Large mass in the right lobe of the liver demonstrates diffuse hypermetabolism, compatible with recurrent metastatic disease in the liver 2. Multiple small pulmonary nodules are very similar to the recent Chest CT 06/17/16.  3. No other findings of metastatic disease elsewhere in the neck, chest, abdomen, or pelvis. 4. There is some low-level hypermetabolic activity adjacent to the surgical clips in the lateral aspect of the right breast at the site of prior lumpectomy.       07/29/2016 Pathology Results    Liver biopsy confirmed metastatic colon cancer        07/31/2016 - 07/31/2017 Chemotherapy    Chemotherapy FOLFIRI and Avastin (from cycle 2) and Leucovorin (added from cycle 8) every 2 weeks, started on 07/31/2016, multiple delay per pt's request; chemo break from 5/30-6/21/18; decreased leucovorin to 2000 mg/m2, irinotecan to 193m/m2, 5-fu 20011mm2 from 03/05/17, due to poor tolerance. Started chemo break since 07/31/17.       10/23/2016 Imaging    CT ABDOMEN PELVIS W CONTRAST 10/24/16 IMPRESSION: 1. The numerous tiny bilateral pulmonary nodules seen on the previous imaging exams are no longer evident, suggesting response of therapy. 2. Large ill-defined irregular right hepatic lesion measures smaller on today's study. 3. Slight increase in size of hepatoduodenal ligament lymphadenopathy. 4. Abdominal Aortic Atherosclerois (ICD10-170.0)      01/05/2017 Imaging  CT CAP IMPRESSION: 1. Left lower lobe pulmonary nodule and hepatic metastatic disease are stable. 2. Hepatoduodenal ligament lymph node is stable. 3. Aortic atherosclerosis (ICD10-170.0). Coronary artery calcification. 4. Enlarged pulmonary arteries, indicative of pulmonary arterial hypertension. 5. Small bilateral adrenal nodules are unchanged.      05/09/2017 Imaging    CT CAP IMPRESSION: 1. Stable appearance left lower lobe pulmonary nodule with an apparent new tiny posterior right upper lobe pulmonary nodule. 2. Dominant ill-defined right hepatic mass measures smaller on today's study. 3. Slight decrease in the hepatoduodenal ligament lymph node. 4. Aortic Atherosclerois (ICD10-170.0)      05/09/2017 Imaging    CT CAP 05/09/17 IMPRESSION: 1. Stable appearance left lower lobe pulmonary nodule with an apparent new tiny posterior right upper lobe pulmonary nodule. Continued attention on follow-up recommended. 2. Dominant ill-defined right hepatic mass measures smaller on today's study. 3. Slight decrease in the hepatoduodenal ligament lymph node. 4.  Aortic  Atherosclerois (ICD10-170.0)       08/11/2017 Imaging    CT CAP W Contrast 08/11/17 IMPRESSION: 1. Essentially stable appearance of small pulmonary nodules, hypodense mass laterally in the right hepatic lobe, and mild portacaval adenopathy. No new or progressive lesions. 2. Other imaging findings of potential clinical significance: Aortic Atherosclerosis (ICD10-I70.0). Lower lumbar impingement due to spondylosis and degenerative disc disease. Uterine fibroids. Bilateral renal cysts. Coronary atherosclerosis. Mild mitral valve calcification. Mild cardiomegaly.        09/24/2017 - 12/04/2017 Chemotherapy    Maintenance Xeloda 2500 mg twice daily days 1 through 14 of each cycle of treatment and Avastin given every 3 weeks starting on 09/24/2017. Due to slight progression this was stopped on 12/04/17       12/02/2017 Imaging    CT CAP W Contrast 12/02/17 IMPRESSION: Chest Impression: 1. Interval increase in size of bilateral small pulmonary nodules. 2. No mediastinal adenopathy  Abdomen / Pelvis Impression: 1. Dominant lesion in the RIGHT hepatic lobe is similar; however there are 2 adjacent low-density lesions in the RIGHT hepatic lobe which are not appreciated on comparison exam and concerning recurrent hepatic metastasis. 2. Interval increase in size periaortic retroperitoneal lymph nodes consistent with recurrent metastatic adenopathy.      01/08/2018 -  Chemotherapy    Due to slight disease progression she restarted on Irinotecan plus Avastin every 2 weeks on 01/08/18. She declined 5-fu.       CURRENT TREATMENT:   1. Letrozole 2.5 mg once daily, started in September 2017 2. Irinotecan plus Avastin every 2 weeks started on 01/08/18   INTERIM HISTORY:   Veronica Reyes 62 y.o. female with a history of recurrent left breast cancer, colon cancer s/p laparoscopic-assisted partial colectomy (03/09/2014), PE (01/06/2014), and right breast cancer (01/2015), presents to  clinic for follow up.   She presents to the clinic today by herself. She canceled her chemo treatment last week due to productive cough, and a one episode of fever.  she has been taking Mucinex which has helped some. She only had a fever once.  She called her primary care physician, but could not get appointment.  On review of symptoms, pt notes coughing with no phlegm but has congestion. She is eating and drinking adequately. She notes occasional hot flash.     MEDICAL HISTORY: Past Medical History:  Diagnosis Date  . Anxiety   . Breast CA ([place])    radiation and surgery-lt.  . Cancer ([place])    left breast cancer x2  . Hypertension   .  Liver lesion   . Personal history of chemotherapy   . Personal history of radiation therapy 2017  . Pulmonary embolism ([place])    "blood clot in lungs" -dx. 4'15 with CT Chest. On Xarelto  . Radiation 01/22/10-03/12/10   left whole breast 4500 cGy, upper aspect boosted to 6120 cGy  . Radiation 02/21/16 - 04/10/16   right breast 50.4 Gy, right breast boost 12 Gy  . Shortness of breath    sob with exertion. dx. with Pulmonary emboli 4'15 ,tx. Xarelto,Lovenox used for [employer].    ALLERGIES:  is allergic to tramadol.  MEDICATIONS:  Allergies as of 03/12/2018      Reactions   Tramadol Other (See Comments)   Dizziness      Medication List        Accurate as of 03/12/18 10:45 AM. Always use your most recent med list.          acetaminophen-codeine 300-60 MG tablet Commonly known as:  TYLENOL #4 Take 1 tablet by mouth every 8 (eight) hours as needed. for pain   amLODipine 5 MG tablet Commonly known as:  NORVASC Take 1 tablet (5 mg total) by mouth daily.   dexamethasone 4 MG tablet Commonly known as:  DECADRON Take 1 tablet (4 mg total) by mouth daily.   ferrous sulfate 325 (65 FE) MG tablet Take 325 mg by mouth daily with breakfast. Pt takes  4 times per week.   hydrochlorothiazide 25 MG tablet Commonly known as:  HYDRODIURIL Take 1 tablet  (25 mg total) by mouth every morning.   letrozole 2.5 MG tablet Commonly known as:  FEMARA TAKE 1 TABLET BY MOUTH EVERY DAY   lidocaine-prilocaine cream Commonly known as:  EMLA Apply 1 application topically as needed.   lidocaine-prilocaine cream Commonly known as:  EMLA Apply 1 application topically as needed.   LORazepam 0.5 MG tablet Commonly known as:  ATIVAN Take 1 tablet (0.5 mg total) by mouth every 8 (eight) hours.   magic mouthwash w/lidocaine Soln Take 5 mLs by mouth 4 (four) times daily as needed for mouth pain.   metoprolol tartrate 50 MG tablet Commonly known as:  LOPRESSOR Take 1 tablet (50 mg total) by mouth 2 (two) times daily.   ondansetron 8 MG tablet Commonly known as:  ZOFRAN Take 1 tablet (8 mg total) by mouth 2 (two) times daily as needed for refractory nausea / vomiting. Start on day 3 after chemotherapy.   potassium chloride SA 20 MEQ tablet Commonly known as:  K-DUR,KLOR-CON TAKE 1 TABLET BY MOUTH 2 TIMES DAILY   prochlorperazine 10 MG tablet Commonly known as:  COMPAZINE Take 1 tablet (10 mg total) by mouth every 6 (six) hours as needed (NAUSEA).   promethazine 25 MG tablet Commonly known as:  PHENERGAN Take 1 tablet (25 mg total) by mouth every 6 (six) hours as needed for nausea or vomiting.   XARELTO 20 MG Tabs tablet Generic drug:  rivaroxaban TAKE 1 TABLET BY MOUTH EVERY DAY   zolpidem 5 MG tablet Commonly known as:  AMBIEN Take 1 tablet (5 mg total) by mouth at bedtime as needed for sleep.       SURGICAL HISTORY:  Past Surgical History:  Procedure Laterality Date  . BREAST BIOPSY Left 2010   malignant  . BREAST BIOPSY Right 01/2015   malignant  . BREAST LUMPECTOMY Left 2006  . BREAST LUMPECTOMY Left 2010  . BREAST LUMPECTOMY Right 11/07/2015  . BREAST LUMPECTOMY WITH RADIOACTIVE SEED AND SENTINEL LYMPH  NODE BIOPSY Right 11/07/2015   Procedure: BREAST LUMPECTOMY WITH RADIOACTIVE SEED AND SENTINEL LYMPH NODE BIOPSY;  Surgeon:  Stark Klein, MD;  Location[place];  Service: General;  Laterality: Right;  . BREAST SURGERY     '11- Left breast lumpectomy  . CESAREAN SECTION    . CHOLECYSTECTOMY     Laparoscopic 20 yrs ago  . COLON SURGERY  03-09-14   partial colectomy for cancer  . IR GENERIC HISTORICAL  07/29/2016   IR FLUORO GUIDE PORT INSERTION RIGHT 07/29/2016 Sandi Mariscal, MD [facility]  . IR GENERIC HISTORICAL  07/29/2016   IR US GUIDE VASC ACCESS RIGHT 07/29/2016 Sandi Mariscal, MD [facility]  . IR GENERIC HISTORICAL  07/29/2016   IR US GUIDE BX ASP/DRAIN 07/29/2016 [facility]  . PARTIAL COLECTOMY N/A 03/09/2014   Procedure: LAPAROSCOPIC ASSISTED PARTIAL COLECTOMY, splenic flexure mobilization;  Surgeon: Leighton Ruff, MD;  Location: [facility];  Service: General;  Laterality: N/A;  . RADIOFREQUENCY ABLATION Right 08/24/2015   Procedure: MICROWAVE ABLATION RIGHT LIVER LOBE ;  Surgeon: Corrie Mckusick, DO;  Location: [facility];  Service: Anesthesiology;  Laterality: Right;    REVIEW OF SYSTEMS:  Constitutional: Denies fevers, chills or abnormal weight loss  (+) hot flash Eyes: Denies blurriness of vision Ears, nose, mouth, throat, and face: Denies mucositis.  Respiratory: Denies cough, dyspnea or wheezes (+) aggressive cough, congestion  Cardiovascular: Denies palpitation, chest discomfort or lower extremity swelling Gastrointestinal:  Denies heartburn or change in bowel habits. (+) right flank pain, stable.  Skin: Denies abnormal skin rashes Musculoskeletal: Denies concerning joint pains.  Lymphatics: Denies new lymphadenopathy or easy bruising Neurological:Denies numbness, tingling or new weaknesses Musculoskeletal: (+) left knee discomfort  Behavioral/Psych: Mood is stable, no new changes  All other systems were reviewed with the patient and are negative.    PHYSICAL EXAMINATION:   ECOG PERFORMANCE STATUS: 1 Blood pressure (!) 153/84, pulse 78, temperature 98.9 F (37.2 C), temperature  source Oral, resp. rate 17, height 5' 6" (1.676 m), weight 288 lb 9.6 oz (130.9 kg), last menstrual period 01/23/2014, SpO2 100 %.  Vitals:   03/12/18 1024  BP: (!) 153/84  Pulse: 78  Resp: 17  Temp: 98.9 F (37.2 C)  TempSrc: Oral  SpO2: 100%  Weight: 288 lb 9.6 oz (130.9 kg)  Height: 5' 6" (1.676 m)    GENERAL:alert, no distress and comfortable; well developed and obese. Easily mobile to exam table.  SKIN: skin color, texture, turgor are normal, no rashes or significant lesions EYES: normal, Conjunctiva are pink and non-injected, sclera clear OROPHARYNX:no exudate, no erythema and lips, buccal mucosa, and tongue normal  NECK: supple, thyroid normal size, non-tender, without nodularity LYMPH:  no palpable lymphadenopathy in the cervical, axillary or supraclavicular LUNGS: clear to auscultation with normal breathing effort, no wheezes or rhonchi  HEART: regular rate & rhythm and no murmurs and no lower extremity edema ABDOMEN:abdomen soft, non-tender and normal bowel sounds; incision healing with signs of infection.  Mild hepatomegaly with tenderness at the RUQ, no abdominal tenderness. No local megaly Musculoskeletal:no cyanosis of digits and no clubbing, limited ROM of left shoulder  NEURO: alert & oriented x 3 with fluent speech, no focal motor/sensory deficits  Labs:  CBC Latest Ref Rng & Units 03/12/2018 02/19/2018 02/05/2018  WBC 3.9 - 10.3 K/uL 7.8 11.9(H) 10.2  Hemoglobin 11.6 - 15.9 g/dL 13.0 12.7 13.1  Hematocrit 34.8 - 46.6 % 39.1 38.2 39.3  Platelets 145 - 400 K/uL 258 341 286  CMP Latest Ref Rng & Units 03/12/2018 02/19/2018 02/05/2018  Glucose 70 - 99 mg/dL 122(H) 109 137  BUN 8 - 23 mg/dL _0 Creatinine 0.44 - 1.00 mg/dL 0.83 0.79 0.77  Sodium 135 - 145 mmol/L 141 139 141  Potassium 3.5 - 5.1 mmol/L 4.0 3.6 3.5  Chloride 98 - 111 mmol/L 105 104 106  CO2 22 - 32 mmol/L _1 Calcium 8.9 - 10.3 mg/dL 9.4 9.7 9.7  Total Protein 6.5 - 8.1 g/dL 7.5 7.7 7.8    Total Bilirubin 0.3 - 1.2 mg/dL 0.9 0.5 0.5  Alkaline Phos 38 - 126 U/L 63 112 186(H)  AST 15 - 41 U/L 43(H) 26 97(H)  ALT 0 - 44 U/L 32 27 90(H)    CEA  01/18/2014: 0.8 03/01/2015: 1.3 01/31/2016: 4.8 05/28/2016: 48 07/31/2016: 173.7 08/28/16: 139 09/25/2016: 43 11/04/16: 9.94 12/18/2016: 6.99 01/29/17: 4.58 03/04/17: 6.09 04/09/17: 4.22 05/07/17: 5.29 06/04/17: 4.12 07/02/17: 6.78 07/30/17 : 5.35  09/24/17: 5.41 10/23/17: 8.03 11/13/17: 12.32 01/01/2018: 27.70 02/05/2018: 39.93 03/12/18: PENDING   PATHOLOGY RESULT Diagnosis 01/02/2015 Liver, needle/core biopsy, right - POSITIVE FOR METASTATIC ADENOCARCINOMA. - SEE COMMENT.   Diagnosis 11/07/2015 1. Breast, lumpectomy, Right - INVASIVE DUCTAL CARCINOMA, GRADE 2, SPANNING 1 CM. - DUCTAL CARCINOMA IN SITU, LOW GRADE. - RESECTION MARGINS ARE NEGATIVE FOR INVASIVE CARCINOMA. - DUCTAL CARCINOMA IN SITU COMES TO WITHIN 0.3 CM OF THE POSTERIOR MARGIN. - BIOPSY SITE. - SEE ONCOLOGY TABLE. 2. Lymph node, sentinel, biopsy, right axillary #1 - ONE OF ONE LYMPH NODES NEGATIVE FOR CARCINOMA (0/1). 3. Lymph node, sentinel, biopsy, right axillary #2 - ONE OF ONE LYMPH NODES NEGATIVE FOR CARCINOMA (0/1). 4. Lymph node, sentinel, biopsy, right axillary #3 - ONE OF ONE LYMPH NODES NEGATIVE FOR CARCINOMA (0/1).  ONCOTYPE DX: RS 24, which predicts a year risk of distant recurrence with tamoxifen alone 14%   Diagnosis 07/29/2016 Liver, needle/core biopsy, posterior of right lobe METASTATIC ADENOCARCINOMA, CONSISTENT WITH COLONIC PRIMARY.  RADIOLOGY STUDIES   PET 07/08/2016 IMPRESSION: 1. Large mass in the right lobe of the liver demonstrates diffuse hypermetabolism, compatible with recurrent metastatic disease in the liver. 2. Multiple small pulmonary nodules are very similar to the recent chest CT 06/17/2016. These do not demonstrate hypermetabolism on the PET images, however, these lesions are well below the level of PET imaging. Given  that these nodules are new compared to prior chest CT 12/18/2015, they are concerning for potential metastatic disease to the lungs, and continued attention on followup studies is recommended. 3. No other findings of metastatic disease elsewhere in the neck, chest, abdomen or pelvis. 4. There is some low-level hypermetabolic activity adjacent to the surgical clips in the lateral aspect of the right breast at the site of prior lumpectomy. This area has undergone interval involution over the several prior examinations, and this low-level activity is favored to be related to normal healing. Attention on follow-up imaging is recommended to ensure the stability or resolution of this finding. 5. Aortic atherosclerosis, in addition to 2 vessel coronary artery disease. Please note that although the presence of coronary artery calcium documents the presence of coronary artery disease, the severity of this disease and any potential stenosis cannot be assessed on this non-gated CT examination. Assessment for potential risk factor modification, dietary therapy or pharmacologic therapy may be warranted, if clinically indicated. 6. Additional incidental findings, as above.  MM DIAG BREAST TOMO BILATERAL 10/17/2016 FINDINGS: There are bilateral lumpectomy changes in the upper central left  breast and upper central right breast. No mass, nonsurgical distortion, or suspicious microcalcification is identified in either breast to suggest malignancy. Mammographic images were processed with CAD. IMPRESSION: No evidence of malignancy in either breast. Lumpectomy changes bilaterally. RECOMMENDATION: Diagnostic mammogram is suggested in 1 year. (Code:DM-B-01Y)  CT CHEST, ABDOMEN PELVIS W CONTRAST 10/24/16 IMPRESSION: 1. The numerous tiny bilateral pulmonary nodules seen on the previous imaging exams are no longer evident, suggesting response of therapy. 2. Large ill-defined irregular right hepatic lesion measures  smaller on today's study. 3. Slight increase in size of hepatoduodenal ligament lymphadenopathy. 4. Abdominal Aortic Atherosclerois (ICD10-170.0)  CT CAP 01/05/17 IMPRESSION: 1. Left lower lobe pulmonary nodule and hepatic metastatic disease are stable. 2. Hepatoduodenal ligament lymph node is stable. 3. Aortic atherosclerosis (ICD10-170.0). Coronary artery calcification. 4. Enlarged pulmonary arteries, indicative of pulmonary arterial hypertension. 5. Small bilateral adrenal nodules are unchanged.  CT CAP 05/09/17 IMPRESSION: 1. Stable appearance left lower lobe pulmonary nodule with an apparent new tiny posterior right upper lobe pulmonary nodule. Continued attention on follow-up recommended. 2. Dominant ill-defined right hepatic mass measures smaller on today's study. 3. Slight decrease in the hepatoduodenal ligament lymph node. 4.  Aortic Atherosclerois (ICD10-170.0)   CT CAP W Contrast 08/11/17 IMPRESSION: 1. Essentially stable appearance of small pulmonary nodules, hypodense mass laterally in the right hepatic lobe, and mild portacaval adenopathy. No new or progressive lesions. 2. Other imaging findings of potential clinical significance: Aortic Atherosclerosis (ICD10-I70.0). Lower lumbar impingement due to spondylosis and degenerative disc disease. Uterine fibroids. Bilateral renal cysts. Coronary atherosclerosis. Mild mitral valve calcification. Mild cardiomegaly.   CT CAP W Contrast 12/02/17 IMPRESSION: Chest Impression: 1. Interval increase in size of bilateral small pulmonary nodules. 2. No mediastinal adenopathy  Abdomen / Pelvis Impression: 1. Dominant lesion in the RIGHT hepatic lobe is similar; however there are 2 adjacent low-density lesions in the RIGHT hepatic lobe which are not appreciated on comparison exam and concerning recurrent hepatic metastasis. 2. Interval increase in size periaortic retroperitoneal lymph nodes consistent with recurrent  metastatic adenopathy.  MM DIAG BREAST, 12/16/2017 IMPRESSION: No mammographic evidence of malignancy in either breast, status post bilateral mastectomies.  ASSESSMENT: Veronica Reyes 62 y.o. female   1. Colon Cancer, Stage I (pT2N0), MMR normal, oligo recurrent disease in liver in 12/2014, KRAS mutation (+), liver and lung recurrence in 06/2016 -I previously reviewed the nature history of metastatic colon cancer, and the potential curative approach with chemotherapy followed by liver lesion ablation or resection. However, more likely, this is not curable disease. -She initially received 3 cycles of Capox, did not want to proceed with fourth cycle due to the moderate side effects from previous chemotherapy treatment. She declined liver mass resection, and underwent liver ablation by interventional radiologist Dr. Earleen Newport in 08/2015. -I previously reviewed her restaging CT scan from 06/17/2016, which unfortunately showed new area of infiltrative disease as the previous ablated liver lesion, likely recurrence. She also developed several new pulmonary nodules, small, concerning for metastatic disease. 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 -She started chemotherapy FOLFIRI and Avastin on 08/13/16. Inially she tolerated well but after many cycles she did develop side effects. I previously had to decrease her chemo dose a few times or pt requested a break several times for multiple reasons. Last FOLFIRI chemo was on 07/30/17 (cycle 18). -She did have a good response to chemo, her CEA significantly decreased. CT scan from 05/09/2017 showed continuous response to treatment, her liver  metastasis slightly decreased in size.  -After CT CAP from 08/11/17 which showed stable controlled disease, I was able to switch her to maintenance therapy. She started Xeloda with Avastin every 3 weeks on 09/24/17.  -CT CAP W Contrast from 12/02/17  reveals interval increase in size of bilateral small pulmonary nodules and two new lesions in the right hepatic lobe, although her previous liver metastasis in the right lobe has decreased in size.  -Her treatment was subsequently changed back to irinotecan and avastin. Pt declined 5-fu pump.  -She was not very compliant with treatment, due to her trips, her husband surgery etc. -Labs reveiwed and adequate to proceed with Irinotecan/avastin today  -Will repeat scan after cycle 5.  -F/u in 2 weeks with Lacie and 7/24 with me    2. Right breast cancer, [id number], stage Ia, ER+/PR+/HER2-, History of left Breast Cancer, diagnosed on 02/01/2015 -Left CIS in situ, s/p lumpectomy in 2006 and 2011 by Dr. Margot Chimes, and radiation in 2011 by Dr. Sondra Come.  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 Surgical margins were negative. -I previously reviewed her Oncotype DX result. Her recurrence score is 22, which predicts 14% 10 year risk of distant recurrence with tamoxifen alone. This is intermediate risk, based on her relatively low number, I do not recommend adjuvant chemotherapy. -She has completed adjuvant breast radiation with Dr. Sondra Come in July 2017.  -her [facility] level supported she is postmenopausal -She started adjuvant letrozole in 05/2016, tolerating well, we'll continue. -We previously discussed breast cancer surveillance, I strongly encouraged her to continue annual screening mammogram, self exam, and follow-up with [place] routinely. -diagnostic mammogram on 12/16/2017 showed: No mammographic evidence of malignancy in either breast, status post bilateral mastectomies.   3. History of PE (01/06/2014) -She has a diagnosis of pulmonary embolism, likely secondary to malignancy in the setting of family history for stroke (and possible stroke history in patient as well). Lower extremity dopplers negative.  -continue Xarelto. Due  to her metastatic colon cancer, I previously recommended her to continue indefinitely.  4.  Uterine Fibroids  --Negative endometrial biopsy. Following with Gynecology.  5. Kidney lesion (Right) -No hypermetabolic right kidney lesion on the pet scan -Repeat abdominal MRI 05/29/2015 showed unchanged right renal lesion, favored to represent a complex/septate cyst.  6. Right flank pain, Knee discomfort  -possibly related to her liver metastasis -I previously suggested she see a orthopedist for her knee issues.  -Continue Tylenol No. 4 as needed for pain. Her abdominal pain overall has improved, controlled. Refilled Tylenol #4 today (03/12/18)   7.  Morbid obesity - I have previously encouraged her to eat healthy and exercise more.  8. HTN  -We previously discussed that Avastin can increase her blood pressure, I recommend her to continue monitoring at home -She was mildly hypertensive on 07/16/17, we will increase her dose of Amlodipine to 46m if needed in the future.  -Continue Amlodipine, lopressor and HCTZ  -BP stable, she is looking for new PCP   9. Hypokalemia  -Given intermittent diarrhea she will continue oral K supplement for now at 1 tab BID.  -K at 4.0 today (03/12/18), continue oral K BID  10. Acute bronchitis   -She developed productive cough and one episode of fever last week, she called her primary care physician but was not able to get appointment.  She has been using Musinex, her cough has improved, but not resolved, still has mild  sputum production.  I will call in Z-pack today  -If this persists or worsens with fever or phlegm she should contact me.   Plan  -I prescribed her Zpack and refilled Tylenol #4 today  -Labs reviewed and adequate to proceed with irinotecan/avastin today  -Cancel all future scheduled appointment  -Lab, flush, and chemo irinotecan and avastin in 2, 4 and 6 weeks  -CT CAP with contrast the early week of 7/22 -F/u with lacie in 2 weeks and me on  7/24 (after CT)    All questions were answered. The patient knows to call the clinic with any problems, questions or concerns. We can certainly see the patient much sooner if necessary.  I spent 20 minutes counseling the patient face to face. The total time spent in the appointment was 25 minutes.  Oneal Deputy, am acting as scribe for [name], MD.    I have reviewed the above documentation for accuracy and completeness, and I agree with the above.    [name]  03/12/2018

## 2018-03-09 NOTE — Telephone Encounter (Signed)
Called pt re appts that were added per 6/21 sch msg - attempted to leave vm for pt on two phone numbers but one did not work and the other had a voicemail box that was not set up yet.

## 2018-03-12 ENCOUNTER — Inpatient Hospital Stay: Payer: BLUE CROSS/BLUE SHIELD

## 2018-03-12 ENCOUNTER — Inpatient Hospital Stay (HOSPITAL_BASED_OUTPATIENT_CLINIC_OR_DEPARTMENT_OTHER): Payer: BLUE CROSS/BLUE SHIELD | Admitting: Hematology

## 2018-03-12 ENCOUNTER — Encounter: Payer: Self-pay | Admitting: Hematology

## 2018-03-12 VITALS — BP 153/84 | HR 78 | Temp 98.9°F | Resp 17 | Ht 66.0 in | Wt 288.6 lb

## 2018-03-12 VITALS — BP 145/86 | HR 77

## 2018-03-12 DIAGNOSIS — R05 Cough: Secondary | ICD-10-CM | POA: Diagnosis not present

## 2018-03-12 DIAGNOSIS — Z86711 Personal history of pulmonary embolism: Secondary | ICD-10-CM

## 2018-03-12 DIAGNOSIS — C187 Malignant neoplasm of sigmoid colon: Secondary | ICD-10-CM

## 2018-03-12 DIAGNOSIS — Z86718 Personal history of other venous thrombosis and embolism: Secondary | ICD-10-CM

## 2018-03-12 DIAGNOSIS — Z170 Estrogen receptor positive status [ER+]: Secondary | ICD-10-CM

## 2018-03-12 DIAGNOSIS — Z5112 Encounter for antineoplastic immunotherapy: Secondary | ICD-10-CM | POA: Diagnosis not present

## 2018-03-12 DIAGNOSIS — Z6841 Body Mass Index (BMI) 40.0 and over, adult: Secondary | ICD-10-CM | POA: Diagnosis not present

## 2018-03-12 DIAGNOSIS — C787 Secondary malignant neoplasm of liver and intrahepatic bile duct: Secondary | ICD-10-CM

## 2018-03-12 DIAGNOSIS — Z853 Personal history of malignant neoplasm of breast: Secondary | ICD-10-CM

## 2018-03-12 DIAGNOSIS — C7800 Secondary malignant neoplasm of unspecified lung: Secondary | ICD-10-CM

## 2018-03-12 DIAGNOSIS — C50511 Malignant neoplasm of lower-outer quadrant of right female breast: Secondary | ICD-10-CM | POA: Diagnosis not present

## 2018-03-12 DIAGNOSIS — I10 Essential (primary) hypertension: Secondary | ICD-10-CM

## 2018-03-12 LAB — CBC WITH DIFFERENTIAL (CANCER CENTER ONLY)
BASOS ABS: 0.1 10*3/uL (ref 0.0–0.1)
BASOS PCT: 1 %
EOS ABS: 0.2 10*3/uL (ref 0.0–0.5)
EOS PCT: 3 %
HCT: 39.1 % (ref 34.8–46.6)
Hemoglobin: 13 g/dL (ref 11.6–15.9)
Lymphocytes Relative: 30 %
Lymphs Abs: 2.4 10*3/uL (ref 0.9–3.3)
MCH: 31.5 pg (ref 25.1–34.0)
MCHC: 33.2 g/dL (ref 31.5–36.0)
MCV: 94.8 fL (ref 79.5–101.0)
Monocytes Absolute: 0.7 10*3/uL (ref 0.1–0.9)
Monocytes Relative: 9 %
Neutro Abs: 4.5 10*3/uL (ref 1.5–6.5)
Neutrophils Relative %: 57 %
PLATELETS: 258 10*3/uL (ref 145–400)
RBC: 4.12 MIL/uL (ref 3.70–5.45)
RDW: 17.4 % — ABNORMAL HIGH (ref 11.2–14.5)
WBC: 7.8 10*3/uL (ref 3.9–10.3)

## 2018-03-12 LAB — COMPREHENSIVE METABOLIC PANEL
ALT: 32 U/L (ref 0–44)
AST: 43 U/L — AB (ref 15–41)
Albumin: 3 g/dL — ABNORMAL LOW (ref 3.5–5.0)
Alkaline Phosphatase: 63 U/L (ref 38–126)
Anion gap: 10 (ref 5–15)
BUN: 9 mg/dL (ref 8–23)
CHLORIDE: 105 mmol/L (ref 98–111)
CO2: 26 mmol/L (ref 22–32)
CREATININE: 0.83 mg/dL (ref 0.44–1.00)
Calcium: 9.4 mg/dL (ref 8.9–10.3)
GFR calc non Af Amer: >60 mL/min (ref >60)
Glucose, Bld: 122 mg/dL — ABNORMAL HIGH (ref 70–99)
Potassium: 4 mmol/L (ref 3.5–5.1)
SODIUM: 141 mmol/L (ref 135–145)
Total Bilirubin: 0.9 mg/dL (ref 0.3–1.2)
Total Protein: 7.5 g/dL (ref 6.5–8.1)

## 2018-03-12 LAB — TOTAL PROTEIN, URINE DIPSTICK: PROTEIN: NEGATIVE mg/dL

## 2018-03-12 LAB — CEA (IN HOUSE-CHCC): CEA (CHCC-In House): 36.22 ng/mL — ABNORMAL HIGH (ref 0.00–5.00)

## 2018-03-12 MED ORDER — PROCHLORPERAZINE MALEATE 10 MG PO TABS
10.0000 mg | ORAL_TABLET | Freq: Once | ORAL | Status: AC | PRN
  Administered 2018-03-12: 10 mg via ORAL

## 2018-03-12 MED ORDER — AZITHROMYCIN 500 MG PO TABS: 500.0000 mg | ORAL_TABLET | Freq: Every day | ORAL | 0 refills | Status: DC

## 2018-03-12 MED ORDER — ATROPINE SULFATE 1 MG/ML IJ SOLN
0.5000 mg | Freq: Once | INTRAMUSCULAR | Status: AC | PRN
  Administered 2018-03-12: 0.5 mg via INTRAVENOUS

## 2018-03-12 MED ORDER — SODIUM CHLORIDE 0.9 % IV SOLN
600.0000 mg | Freq: Once | INTRAVENOUS | Status: AC
  Administered 2018-03-12: 600 mg via INTRAVENOUS
  Filled 2018-03-12: qty 16

## 2018-03-12 MED ORDER — PROCHLORPERAZINE MALEATE 10 MG PO TABS
ORAL_TABLET | ORAL | Status: AC
  Filled 2018-03-12: qty 1

## 2018-03-12 MED ORDER — PALONOSETRON HCL INJECTION 0.25 MG/5ML
0.2500 mg | Freq: Once | INTRAVENOUS | Status: AC
  Administered 2018-03-12: 0.25 mg via INTRAVENOUS

## 2018-03-12 MED ORDER — SODIUM CHLORIDE 0.9 % IV SOLN
Freq: Once | INTRAVENOUS | Status: AC
  Administered 2018-03-12: 12:00:00 via INTRAVENOUS

## 2018-03-12 MED ORDER — SODIUM CHLORIDE 0.9 % IV SOLN
Freq: Once | INTRAVENOUS | Status: AC
  Administered 2018-03-12: 12:00:00 via INTRAVENOUS
  Filled 2018-03-12: qty 5

## 2018-03-12 MED ORDER — IRINOTECAN HCL CHEMO INJECTION 100 MG/5ML
130.0000 mg/m2 | Freq: Once | INTRAVENOUS | Status: AC
  Administered 2018-03-12: 320 mg via INTRAVENOUS
  Filled 2018-03-12: qty 16

## 2018-03-12 MED ORDER — ACETAMINOPHEN-CODEINE #4 300-60 MG PO TABS: 1.0000 | ORAL_TABLET | Freq: Three times a day (TID) | ORAL | 0 refills | Status: DC | PRN

## 2018-03-12 MED ORDER — SODIUM CHLORIDE 0.9% FLUSH
10.0000 mL | INTRAVENOUS | Status: DC | PRN
  Administered 2018-03-12: 10 mL
  Filled 2018-03-12: qty 10

## 2018-03-12 MED ORDER — PALONOSETRON HCL INJECTION 0.25 MG/5ML
INTRAVENOUS | Status: AC
  Filled 2018-03-12: qty 5

## 2018-03-12 MED ORDER — ATROPINE SULFATE 1 MG/ML IJ SOLN
INTRAMUSCULAR | Status: AC
  Filled 2018-03-12: qty 1

## 2018-03-12 MED ORDER — HEPARIN SOD (PORK) LOCK FLUSH 100 UNIT/ML IV SOLN
500.0000 [IU] | Freq: Once | INTRAVENOUS | Status: AC | PRN
  Administered 2018-03-12: 500 [IU]
  Filled 2018-03-12: qty 5

## 2018-03-12 NOTE — Patient Instructions (Signed)
[facility] Discharge Instructions for Patients Receiving Chemotherapy  Today you received the following chemotherapy agents Avastin,Irinotecan  To help prevent nausea and vomiting after your treatment, we encourage you to take your nausea medication as directed  If you develop nausea and vomiting that is not controlled by your nausea medication, call the clinic.   BELOW ARE SYMPTOMS THAT SHOULD BE REPORTED IMMEDIATELY:  *FEVER GREATER THAN 100.5 F  *CHILLS WITH OR WITHOUT FEVER  NAUSEA AND VOMITING THAT IS NOT CONTROLLED WITH YOUR NAUSEA MEDICATION  *UNUSUAL SHORTNESS OF BREATH  *UNUSUAL BRUISING OR BLEEDING  TENDERNESS IN MOUTH AND THROAT WITH OR WITHOUT PRESENCE OF ULCERS  *URINARY PROBLEMS  *BOWEL PROBLEMS  UNUSUAL RASH Items with * indicate a potential emergency and should be followed up as soon as possible.  Feel free to call the clinic should you have any questions or concerns. The clinic phone number is [phone].  Please show the [facility] at check-in to the Emergency Department and triage nurse.

## 2018-03-13 ENCOUNTER — Encounter: Payer: Self-pay | Admitting: Hematology

## 2018-03-19 ENCOUNTER — Other Ambulatory Visit: Payer: BLUE CROSS/BLUE SHIELD

## 2018-03-19 ENCOUNTER — Ambulatory Visit: Payer: BLUE CROSS/BLUE SHIELD

## 2018-03-19 ENCOUNTER — Ambulatory Visit: Payer: BLUE CROSS/BLUE SHIELD | Admitting: Hematology

## 2018-03-22 NOTE — Progress Notes (Deleted)
[place]  Telephone:([phone]) [phone] Fax:(336) [phone]  Clinic Follow up Note   Patient Care Team: [name], MD as PCP - General (Internal Medicine) Leighton Ruff, MD as Consulting Physician (General Surgery) Carol Ada, MD as Consulting Physician (Gastroenterology) Concha Norway, MD (Inactive) as Consulting Physician (Internal Medicine) Emily Filbert, MD as Consulting Physician (Obstetrics and Gynecology) Tania Ade, RN as Registered Nurse (Medical Oncology) Stark Klein, MD as Consulting Physician (General Surgery) [name], MD as Consulting Physician (Hematology) 03/22/2018  SUMMARY OF ONCOLOGIC HISTORY: Oncology History   Breast cancer of lower-outer quadrant of right female breast [facility])   Staging form: Breast, AJCC 7th Edition     Clinical stage from 02/01/2015: Stage IA (T1a, N0, M0) - Signed by [name], MD on 06/04/2015     Pathologic stage from 11/10/2015: Stage IA (T1b, N0, M0) - Signed by [name], MD on 12/08/2015 Cancer of sigmoid colon [facility])   Staging form: Colon and Rectum, AJCC 7th Edition     Clinical: Stage I (T2, N0, M0) - Unsigned     Breast cancer of lower-outer quadrant of right female breast ([employer])   2006 Cancer Diagnosis    Left breast DCIS diagnosed in 2006 and 2010, status post lumpectomy.      01/07/2014 Initial Diagnosis    Breast cancer      01/18/2015 Mammogram    35m mass in lower outer quadrant of right breast       02/01/2015 Initial Biopsy    (R) breast mass biopsy showed invasive ductal carcinoma & DCIS, grade 1-2.       02/01/2015 Receptors her2    ER 90%+, PR 10%+, Ki67 10%, HER2 negative (ratio 1.09)       11/07/2015 Surgery    (R) breast lumpectomy and sentinel lymph node biopsy ([name]      11/07/2015 Pathology Results    (R) breast lumpectomy showed invasive ductal carcinoma, grade 2, 1 cm, low-grade DCIS, negative margins, 3 sentinel lymph nodes were negative. LVI (-). HER2 repeated and remains  negative (ratio 1.29).        11/07/2015 Pathologic Stage    pT1b,pN0: Stage IA       11/07/2015 Oncotype testing    RS 22, which predicts a year risk of distant recurrence with tamoxifen alone 14% (intermediate-risk). Adjuvant chemo was not offered      02/21/2016 - 04/10/2016 Radiation Therapy    Adjuvant breast radiation. ([name]). Right breast: 50.4 Gy in 28 fractions.  Right breast boost: 12 Gy in 6 fractions.      05/2016 -  Anti-estrogen oral therapy    Femara (Letrozole) 2.5 mg daily.       10/17/2016 Mammogram    MM DIAG BREAST TOMO BILATERAL 10/17/2016 FINDINGS: There are bilateral lumpectomy changes in the upper central left breast and upper central right breast. No mass, nonsurgical distortion, or suspicious microcalcification is identified in either breast to suggest malignancy. Mammographic images were processed with CAD. IMPRESSION: No evidence of malignancy in either breast. Lumpectomy changes bilaterally. RECOMMENDATION: Diagnostic mammogram is suggested in 1 year. (Code:[id number])      12/16/2017 Mammogram    IMPRESSION: No mammographic evidence of malignancy in either breast, status post bilateral mastectomies. RECOMMENDATION: Diagnostic mammogram is suggested in 1 year.        [place]   03/04/2014 Initial Diagnosis    Cancer of sigmoid colon      03/09/2014 Pathology Results    G1 invasive  adenocarcinoma, no lymph-Vascular invasion or Peri-neural invasion: Absent. MMR normal, MSI stable.       03/09/2014 Surgery    partial colectomy, negative margins.       11/10/2014 Relapse/Recurrence    biopsy confirmed oligo liver recurrence       11/10/2014 Imaging    PET showed two small ares of hypermetabolism in the right lobe of the liver, no other distant metastasis.       11/30/2014 Imaging    abdomen MRI showed a new enhancing lesion which corresponds to an area of abnormal hyper metabolism on recent PET-CT. Stable enlarged portal caval  lymph node.       01/02/2015 Pathology Results    Liver biopsy showed metastatic adenocarcinoma, IHC (+) for CK20, CDX-2, (-) for TTF-1 and ER      01/02/2015 Miscellaneous    liver biopsy KRAS mutation (+)       02/14/2015 Imaging    CT showed:  the small metastatic right hepatic lobe liver lesion is not identified for certain on this examination. No new lesions. Stable small cysts in segment 4 a.       03/05/2015 - 04/16/2015 Chemotherapy    CAPEOX (capecitabine 2500 mg twice daily Day 1-14, oxaliplatin 1 30 mg/m on day 1, every 21 days, S/P 3 cycles       08/24/2015 Procedure    CT-guided oligo liver metastasis microwave ablation by Dr. [name]      06/17/2016 Imaging    CT of the chest/abd/pelvis with contrast 1. Several new small pulmonary nodules worrisome for metastatic disease. 2. New large area of probable infiltrating recurrent tumor in the right hepatic lobe surrounding the prior ablation site. 3. Small supraclavicular lymph nodes.  Attention on future studies. 4. Stable surgical changes involving both breasts. No breast masses are identified. 5. Stable bilateral adrenal gland nodules. 6. Stable upper abdominal and right-sided retroperitoneal lymph nodes.      07/08/2016 PET scan    1. Large mass in the right lobe of the liver demonstrates diffuse hypermetabolism, compatible with recurrent metastatic disease in the liver 2. Multiple small pulmonary nodules are very similar to the recent Chest CT 06/17/16.  3. No other findings of metastatic disease elsewhere in the neck, chest, abdomen, or pelvis. 4. There is some low-level hypermetabolic activity adjacent to the surgical clips in the lateral aspect of the right breast at the site of prior lumpectomy.       07/29/2016 Pathology Results    Liver biopsy confirmed metastatic colon cancer       07/31/2016 - 07/31/2017 Chemotherapy    Chemotherapy FOLFIRI and Avastin (from cycle 2) and Leucovorin (added from cycle 8)  every 2 weeks, started on 07/31/2016, multiple delay per pt's request; chemo break from 5/30-6/21/18; decreased leucovorin to 2000 mg/m2, irinotecan to 182m/m2, 5-fu 20077mm2 from 03/05/17, due to poor tolerance. Started chemo break since 07/31/17.       10/23/2016 Imaging    CT ABDOMEN PELVIS W CONTRAST 10/24/16 IMPRESSION: 1. The numerous tiny bilateral pulmonary nodules seen on the previous imaging exams are no longer evident, suggesting response of therapy. 2. Large ill-defined irregular right hepatic lesion measures smaller on today's study. 3. Slight increase in size of hepatoduodenal ligament lymphadenopathy. 4. Abdominal Aortic Atherosclerois (ICD10-170.0)      01/05/2017 Imaging    CT CAP IMPRESSION: 1. Left lower lobe pulmonary nodule and hepatic metastatic disease are stable. 2. Hepatoduodenal ligament lymph node is stable. 3. Aortic atherosclerosis (ICD10-170.0). Coronary artery calcification.  4. Enlarged pulmonary arteries, indicative of pulmonary arterial hypertension. 5. Small bilateral adrenal nodules are unchanged.      05/09/2017 Imaging    CT CAP IMPRESSION: 1. Stable appearance left lower lobe pulmonary nodule with an apparent new tiny posterior right upper lobe pulmonary nodule. 2. Dominant ill-defined right hepatic mass measures smaller on today's study. 3. Slight decrease in the hepatoduodenal ligament lymph node. 4. Aortic Atherosclerois (ICD10-170.0)      05/09/2017 Imaging    CT CAP 05/09/17 IMPRESSION: 1. Stable appearance left lower lobe pulmonary nodule with an apparent new tiny posterior right upper lobe pulmonary nodule. Continued attention on follow-up recommended. 2. Dominant ill-defined right hepatic mass measures smaller on today's study. 3. Slight decrease in the hepatoduodenal ligament lymph node. 4.  Aortic Atherosclerois (ICD10-170.0)       08/11/2017 Imaging    CT CAP W Contrast 08/11/17 IMPRESSION: 1. Essentially stable appearance of  small pulmonary nodules, hypodense mass laterally in the right hepatic lobe, and mild portacaval adenopathy. No new or progressive lesions. 2. Other imaging findings of potential clinical significance: Aortic Atherosclerosis (ICD10-I70.0). Lower lumbar impingement due to spondylosis and degenerative disc disease. Uterine fibroids. Bilateral renal cysts. Coronary atherosclerosis. Mild mitral valve calcification. Mild cardiomegaly.        09/24/2017 - 12/04/2017 Chemotherapy    Maintenance Xeloda 2500 mg twice daily days 1 through 14 of each cycle of treatment and Avastin given every 3 weeks starting on 09/24/2017. Due to slight progression this was stopped on 12/04/17       12/02/2017 Imaging    CT CAP W Contrast 12/02/17 IMPRESSION: Chest Impression: 1. Interval increase in size of bilateral small pulmonary nodules. 2. No mediastinal adenopathy  Abdomen / Pelvis Impression: 1. Dominant lesion in the RIGHT hepatic lobe is similar; however there are 2 adjacent low-density lesions in the RIGHT hepatic lobe which are not appreciated on comparison exam and concerning recurrent hepatic metastasis. 2. Interval increase in size periaortic retroperitoneal lymph nodes consistent with recurrent metastatic adenopathy.      01/08/2018 -  Chemotherapy    Due to slight disease progression she restarted on Irinotecan plus Avastin every 2 weeks on 01/08/18. She declined 5-fu.      CURRENT TREATMENT:   1. Letrozole 2.5 mg once daily, started in September 2017 2. Irinotecan plus Avastin every 2 weeks started on 01/08/18   INTERVAL HISTORY: Please see below for problem oriented charting.  REVIEW OF SYSTEMS:   Constitutional: Denies fevers, chills or abnormal weight loss Eyes: Denies blurriness of vision Ears, nose, mouth, throat, and face: Denies mucositis or sore throat Respiratory: Denies cough, dyspnea or wheezes Cardiovascular: Denies palpitation, chest discomfort or lower extremity  swelling Gastrointestinal:  Denies nausea, heartburn or change in bowel habits Skin: Denies abnormal skin rashes Lymphatics: Denies new lymphadenopathy or easy bruising Neurological:Denies numbness, tingling or new weaknesses Behavioral/Psych: Mood is stable, no new changes  All other systems were reviewed with the patient and are negative.  MEDICAL HISTORY:  Past Medical History:  Diagnosis Date  . Anxiety   . Breast CA ([place])    radiation and surgery-lt.  . Cancer ([place])    left breast cancer x2  . Hypertension   . Liver lesion   . Personal history of chemotherapy   . Personal history of radiation therapy 2017  . Pulmonary embolism ([place])    "blood clot in lungs" -dx. 4'15 with CT Chest. On Xarelto  . Radiation 01/22/10-03/12/10   left whole breast 4500 cGy, upper  aspect boosted to 6120 cGy  . Radiation 02/21/16 - 04/10/16   right breast 50.4 Gy, right breast boost 12 Gy  . Shortness of breath    sob with exertion. dx. with Pulmonary emboli 4'15 ,tx. Xarelto,Lovenox used for [employer].    SURGICAL HISTORY: Past Surgical History:  Procedure Laterality Date  . BREAST BIOPSY Left 2010   malignant  . BREAST BIOPSY Right 01/2015   malignant  . BREAST LUMPECTOMY Left 2006  . BREAST LUMPECTOMY Left 2010  . BREAST LUMPECTOMY Right 11/07/2015  . BREAST LUMPECTOMY WITH RADIOACTIVE SEED AND SENTINEL LYMPH NODE BIOPSY Right 11/07/2015   Procedure: BREAST LUMPECTOMY WITH RADIOACTIVE SEED AND SENTINEL LYMPH NODE BIOPSY;  Surgeon: Stark Klein, MD;  Location: [place];  Service: General;  Laterality: Right;  . BREAST SURGERY     '11- Left breast lumpectomy  . CESAREAN SECTION    . CHOLECYSTECTOMY     Laparoscopic 20 yrs ago  . COLON SURGERY  03-09-14   partial colectomy for cancer  . IR GENERIC HISTORICAL  07/29/2016   IR FLUORO GUIDE PORT INSERTION RIGHT 07/29/2016 Sandi Mariscal, MD [facility]  . IR GENERIC HISTORICAL  07/29/2016   IR US GUIDE VASC ACCESS RIGHT 07/29/2016  Sandi Mariscal, MD [facility]  . IR GENERIC HISTORICAL  07/29/2016   IR US GUIDE BX ASP/DRAIN 07/29/2016 [facility]  . PARTIAL COLECTOMY N/A 03/09/2014   Procedure: LAPAROSCOPIC ASSISTED PARTIAL COLECTOMY, splenic flexure mobilization;  Surgeon: Leighton Ruff, MD;  Location: [facility];  Service: General;  Laterality: N/A;  . RADIOFREQUENCY ABLATION Right 08/24/2015   Procedure: MICROWAVE ABLATION RIGHT LIVER LOBE ;  Surgeon: Corrie Mckusick, DO;  Location: [facility];  Service: Anesthesiology;  Laterality: Right;    I have reviewed the social history and family history with the patient and they are unchanged from previous note.  ALLERGIES:  is allergic to tramadol.  MEDICATIONS:  Current Outpatient Medications  Medication Sig Dispense Refill  . acetaminophen-codeine (TYLENOL #4) 300-60 MG tablet Take 1 tablet by mouth every 8 (eight) hours as needed. for pain 25 tablet 0  . amLODipine (NORVASC) 5 MG tablet Take 1 tablet (5 mg total) by mouth daily. 30 tablet 0  . azithromycin (ZITHROMAX) 500 MG tablet Take 1 tablet (500 mg total) by mouth daily. 5 tablet 0  . dexamethasone (DECADRON) 4 MG tablet Take 1 tablet (4 mg total) by mouth daily. 3 tablet 1  . ferrous sulfate 325 (65 FE) MG tablet Take 325 mg by mouth daily with breakfast. Pt takes  4 times per week.    . hydrochlorothiazide (HYDRODIURIL) 25 MG tablet Take 1 tablet (25 mg total) by mouth every morning. 30 tablet 1  . letrozole (FEMARA) 2.5 MG tablet TAKE 1 TABLET BY MOUTH EVERY DAY 30 tablet 3  . lidocaine-prilocaine (EMLA) cream Apply 1 application topically as needed. 30 g 2  . lidocaine-prilocaine (EMLA) cream Apply 1 application topically as needed. 30 g 2  . LORazepam (ATIVAN) 0.5 MG tablet Take 1 tablet (0.5 mg total) by mouth every 8 (eight) hours. 30 tablet 0  . magic mouthwash w/lidocaine SOLN Take 5 mLs by mouth 4 (four) times daily as needed for mouth pain. 240 mL 1  . metoprolol tartrate (LOPRESSOR) 50 MG tablet Take 1 tablet (50  mg total) by mouth 2 (two) times daily. 60 tablet 1  . ondansetron (ZOFRAN) 8 MG tablet Take 1 tablet (8 mg total) by mouth 2 (two) times daily as needed for  refractory nausea / vomiting. Start on day 3 after chemotherapy. 30 tablet 1  . potassium chloride SA (K-DUR,KLOR-CON) 20 MEQ tablet TAKE 1 TABLET BY MOUTH 2 TIMES DAILY 60 tablet 2  . prochlorperazine (COMPAZINE) 10 MG tablet Take 1 tablet (10 mg total) by mouth every 6 (six) hours as needed (NAUSEA). 30 tablet 2  . promethazine (PHENERGAN) 25 MG tablet Take 1 tablet (25 mg total) by mouth every 6 (six) hours as needed for nausea or vomiting. 30 tablet 2  . XARELTO 20 MG TABS tablet TAKE 1 TABLET BY MOUTH EVERY DAY 30 tablet 3  . zolpidem (AMBIEN) 5 MG tablet Take 1 tablet (5 mg total) by mouth at bedtime as needed for sleep. 20 tablet 0   No current facility-administered medications for this visit.     PHYSICAL EXAMINATION: ECOG PERFORMANCE STATUS: {CHL ONC ECOG [phone]}  There were no vitals filed for this visit. There were no vitals filed for this visit.  GENERAL:alert, no distress and comfortable SKIN: skin color, texture, turgor are normal, no rashes or significant lesions EYES: normal, Conjunctiva are pink and non-injected, sclera clear OROPHARYNX:no exudate, no erythema and lips, buccal mucosa, and tongue normal  NECK: supple, thyroid normal size, non-tender, without nodularity LYMPH:  no palpable lymphadenopathy in the cervical, axillary or inguinal LUNGS: clear to auscultation and percussion with normal breathing effort HEART: regular rate & rhythm and no murmurs and no lower extremity edema ABDOMEN:abdomen soft, non-tender and normal bowel sounds Musculoskeletal:no cyanosis of digits and no clubbing  NEURO: alert & oriented x 3 with fluent speech, no focal motor/sensory deficits  LABORATORY DATA:  I have reviewed the data as listed CBC Latest Ref Rng & Units 03/12/2018 02/19/2018 02/05/2018  WBC 3.9 - 10.3 K/uL 7.8  11.9(H) 10.2  Hemoglobin 11.6 - 15.9 g/dL 13.0 12.7 13.1  Hematocrit 34.8 - 46.6 % 39.1 38.2 39.3  Platelets 145 - 400 K/uL 258 341 286     CMP Latest Ref Rng & Units 03/12/2018 02/19/2018 02/05/2018  Glucose 70 - 99 mg/dL 122(H) 109 137  BUN 8 - 23 mg/dL _0 Creatinine 0.44 - 1.00 mg/dL 0.83 0.79 0.77  Sodium 135 - 145 mmol/L 141 139 141  Potassium 3.5 - 5.1 mmol/L 4.0 3.6 3.5  Chloride 98 - 111 mmol/L 105 104 106  CO2 22 - 32 mmol/L _1 Calcium 8.9 - 10.3 mg/dL 9.4 9.7 9.7  Total Protein 6.5 - 8.1 g/dL 7.5 7.7 7.8  Total Bilirubin 0.3 - 1.2 mg/dL 0.9 0.5 0.5  Alkaline Phos 38 - 126 U/L 63 112 186(H)  AST 15 - 41 U/L 43(H) 26 97(H)  ALT 0 - 44 U/L 32 27 90(H)   CEA  01/18/2014: 0.8 03/01/2015: 1.3 01/31/2016: 4.8 05/28/2016: 48 07/31/2016: 173.7 08/28/16: 139 09/25/2016: 43 11/04/16: 9.94 12/18/2016: 6.99 01/29/17: 4.58 03/04/17: 6.09 04/09/17: 4.22 05/07/17: 5.29 06/04/17: 4.12 07/02/17: 6.78 07/30/17 : 5.35  09/24/17: 5.41 10/23/17: 8.03 11/13/17: 12.32 01/01/2018: 27.70 02/05/2018: 39.93 03/12/18: PENDING   PATHOLOGY RESULT Diagnosis 01/02/2015 Liver, needle/core biopsy, right - POSITIVE FOR METASTATIC ADENOCARCINOMA. - SEE COMMENT.   Diagnosis 11/07/2015 1. Breast, lumpectomy, Right - INVASIVE DUCTAL CARCINOMA, GRADE 2, SPANNING 1 CM. - DUCTAL CARCINOMA IN SITU, LOW GRADE. - RESECTION MARGINS ARE NEGATIVE FOR INVASIVE CARCINOMA. - DUCTAL CARCINOMA IN SITU COMES TO WITHIN 0.3 CM OF THE POSTERIOR MARGIN. - BIOPSY SITE. - SEE ONCOLOGY TABLE. 2. Lymph node, sentinel, biopsy, right axillary #1 - ONE OF ONE LYMPH NODES  NEGATIVE FOR CARCINOMA (0/1). 3. Lymph node, sentinel, biopsy, right axillary #2 - ONE OF ONE LYMPH NODES NEGATIVE FOR CARCINOMA (0/1). 4. Lymph node, sentinel, biopsy, right axillary #3 - ONE OF ONE LYMPH NODES NEGATIVE FOR CARCINOMA (0/1).  ONCOTYPE DX: RS 43, which predicts a year risk of distant recurrence with tamoxifen alone  14%   Diagnosis 07/29/2016 Liver, needle/core biopsy, posterior of right lobe METASTATIC ADENOCARCINOMA, CONSISTENT WITH COLONIC PRIMARY.     RADIOGRAPHIC STUDIES: I have personally reviewed the radiological images as listed and agreed with the findings in the report. No results found.   ASSESSMENT & PLAN: Veronica Reyes 62 y.o. female   1. Colon Cancer, Stage I (pT2N0), MMR normal, oligo recurrent disease in liver in 12/2014, KRAS mutation (+), liver and lung recurrence in 06/2016 2. Right breast cancer, pT1bN0M0, stage Ia, ER+/PR+/HER2-, History of left Breast Cancer, diagnosed on 02/01/2015 3.  History of PE (01/06/2014) 4.  Uterine fibroids 5.  Kidney lesion (right) 6.  Right flank pain, knee discomfort 7.  Morbid obesity 8.  HTN 9.  Hypokalemia 10.  Acute bronchitis  Plan  No problem-specific Assessment & Plan notes found for this encounter.   No orders of the defined types were placed in this encounter.  All questions were answered. The patient knows to call the clinic with any problems, questions or concerns. No barriers to learning was detected. I spent {CHL ONC TIME VISIT - ENMMH:[id number]} counseling the patient face to face. The total time spent in the appointment was {CHL ONC TIME VISIT - RXYVO:[id number]} and more than 50% was on counseling and review of test results     Alla Feeling, NP 03/22/18

## 2018-03-23 ENCOUNTER — Inpatient Hospital Stay: Payer: BLUE CROSS/BLUE SHIELD | Admitting: Nurse Practitioner

## 2018-03-23 ENCOUNTER — Inpatient Hospital Stay: Payer: BLUE CROSS/BLUE SHIELD

## 2018-03-24 ENCOUNTER — Inpatient Hospital Stay: Payer: BLUE CROSS/BLUE SHIELD

## 2018-03-25 ENCOUNTER — Other Ambulatory Visit: Payer: Self-pay | Admitting: Hematology

## 2018-03-25 ENCOUNTER — Other Ambulatory Visit: Payer: Self-pay | Admitting: Nurse Practitioner

## 2018-03-25 ENCOUNTER — Telehealth: Payer: Self-pay | Admitting: Hematology

## 2018-03-25 DIAGNOSIS — C187 Malignant neoplasm of sigmoid colon: Secondary | ICD-10-CM

## 2018-03-25 MED ORDER — ACETAMINOPHEN-CODEINE #4 300-60 MG PO TABS: 1.0000 | ORAL_TABLET | Freq: Three times a day (TID) | ORAL | 0 refills | Status: DC | PRN

## 2018-03-25 NOTE — Telephone Encounter (Signed)
Called pt re appt made per 7/11 sch msg - originally had her scheduled in the morning but had to be moved to the afternoon due to pt's transportation. Spoke with pt re appts and confirmed them.

## 2018-03-29 ENCOUNTER — Telehealth: Payer: Self-pay | Admitting: Hematology

## 2018-03-29 NOTE — Telephone Encounter (Signed)
Called pt re appts that were adjusted - spoke w/ pt and confirmed changes.

## 2018-03-30 ENCOUNTER — Inpatient Hospital Stay (HOSPITAL_BASED_OUTPATIENT_CLINIC_OR_DEPARTMENT_OTHER): Payer: BLUE CROSS/BLUE SHIELD | Admitting: Nurse Practitioner

## 2018-03-30 ENCOUNTER — Inpatient Hospital Stay: Payer: BLUE CROSS/BLUE SHIELD

## 2018-03-30 ENCOUNTER — Inpatient Hospital Stay: Payer: BLUE CROSS/BLUE SHIELD | Attending: Hematology

## 2018-03-30 ENCOUNTER — Encounter: Payer: Self-pay | Admitting: Nurse Practitioner

## 2018-03-30 VITALS — BP 142/86 | HR 77 | Temp 98.6°F | Resp 18 | Ht 66.0 in | Wt 288.4 lb

## 2018-03-30 VITALS — BP 131/83

## 2018-03-30 DIAGNOSIS — R530 Neoplastic (malignant) related fatigue: Secondary | ICD-10-CM

## 2018-03-30 DIAGNOSIS — C787 Secondary malignant neoplasm of liver and intrahepatic bile duct: Secondary | ICD-10-CM

## 2018-03-30 DIAGNOSIS — Z86711 Personal history of pulmonary embolism: Secondary | ICD-10-CM

## 2018-03-30 DIAGNOSIS — Z7901 Long term (current) use of anticoagulants: Secondary | ICD-10-CM | POA: Insufficient documentation

## 2018-03-30 DIAGNOSIS — C50511 Malignant neoplasm of lower-outer quadrant of right female breast: Secondary | ICD-10-CM | POA: Diagnosis not present

## 2018-03-30 DIAGNOSIS — Z6841 Body Mass Index (BMI) 40.0 and over, adult: Secondary | ICD-10-CM

## 2018-03-30 DIAGNOSIS — C187 Malignant neoplasm of sigmoid colon: Secondary | ICD-10-CM | POA: Diagnosis present

## 2018-03-30 DIAGNOSIS — Z5111 Encounter for antineoplastic chemotherapy: Secondary | ICD-10-CM | POA: Insufficient documentation

## 2018-03-30 DIAGNOSIS — I10 Essential (primary) hypertension: Secondary | ICD-10-CM | POA: Diagnosis not present

## 2018-03-30 DIAGNOSIS — M25569 Pain in unspecified knee: Secondary | ICD-10-CM | POA: Insufficient documentation

## 2018-03-30 DIAGNOSIS — Z5112 Encounter for antineoplastic immunotherapy: Secondary | ICD-10-CM | POA: Insufficient documentation

## 2018-03-30 DIAGNOSIS — R109 Unspecified abdominal pain: Secondary | ICD-10-CM

## 2018-03-30 DIAGNOSIS — Z853 Personal history of malignant neoplasm of breast: Secondary | ICD-10-CM | POA: Diagnosis not present

## 2018-03-30 DIAGNOSIS — E876 Hypokalemia: Secondary | ICD-10-CM | POA: Diagnosis not present

## 2018-03-30 DIAGNOSIS — Z95828 Presence of other vascular implants and grafts: Secondary | ICD-10-CM

## 2018-03-30 DIAGNOSIS — Z170 Estrogen receptor positive status [ER+]: Principal | ICD-10-CM

## 2018-03-30 DIAGNOSIS — C7800 Secondary malignant neoplasm of unspecified lung: Secondary | ICD-10-CM | POA: Insufficient documentation

## 2018-03-30 DIAGNOSIS — R5383 Other fatigue: Secondary | ICD-10-CM

## 2018-03-30 LAB — CBC WITH DIFFERENTIAL (CANCER CENTER ONLY)
BASOS ABS: 0.1 10*3/uL (ref 0.0–0.1)
Basophils Relative: 1 %
Eosinophils Absolute: 0.1 10*3/uL (ref 0.0–0.5)
Eosinophils Relative: 2 %
HEMATOCRIT: 39 % (ref 34.8–46.6)
Hemoglobin: 13 g/dL (ref 11.6–15.9)
LYMPHS ABS: 2.1 10*3/uL (ref 0.9–3.3)
LYMPHS PCT: 28 %
MCH: 31.1 pg (ref 25.1–34.0)
MCHC: 33.4 g/dL (ref 31.5–36.0)
MCV: 93.3 fL (ref 79.5–101.0)
MONOS PCT: 8 %
Monocytes Absolute: 0.6 10*3/uL (ref 0.1–0.9)
NEUTROS ABS: 4.6 10*3/uL (ref 1.5–6.5)
Neutrophils Relative %: 61 %
Platelet Count: 312 10*3/uL (ref 145–400)
RBC: 4.18 MIL/uL (ref 3.70–5.45)
RDW: 17.2 % — ABNORMAL HIGH (ref 11.2–14.5)
WBC Count: 7.4 10*3/uL (ref 3.9–10.3)

## 2018-03-30 LAB — CMP (CANCER CENTER ONLY)
ALBUMIN: 3.2 g/dL — AB (ref 3.5–5.0)
ALT: 29 U/L (ref 0–44)
ANION GAP: 8 (ref 5–15)
AST: 37 U/L (ref 15–41)
Alkaline Phosphatase: 73 U/L (ref 38–126)
BUN: 10 mg/dL (ref 8–23)
CHLORIDE: 104 mmol/L (ref 98–111)
CO2: 28 mmol/L (ref 22–32)
Calcium: 9.9 mg/dL (ref 8.9–10.3)
Creatinine: 0.77 mg/dL (ref 0.44–1.00)
GFR, Est AFR Am: >60 mL/min (ref >60)
GFR, Estimated: >60 mL/min (ref >60)
GLUCOSE: 95 mg/dL (ref 70–99)
POTASSIUM: 3.7 mmol/L (ref 3.5–5.1)
SODIUM: 140 mmol/L (ref 135–145)
TOTAL PROTEIN: 7.9 g/dL (ref 6.5–8.1)
Total Bilirubin: 0.5 mg/dL (ref 0.3–1.2)

## 2018-03-30 LAB — TOTAL PROTEIN, URINE DIPSTICK: PROTEIN: NEGATIVE mg/dL

## 2018-03-30 MED ORDER — SODIUM CHLORIDE 0.9% FLUSH
10.0000 mL | INTRAVENOUS | Status: DC | PRN
  Administered 2018-03-30: 10 mL via INTRAVENOUS
  Filled 2018-03-30: qty 10

## 2018-03-30 MED ORDER — PROCHLORPERAZINE MALEATE 10 MG PO TABS
10.0000 mg | ORAL_TABLET | Freq: Once | ORAL | Status: AC | PRN
  Administered 2018-03-30: 10 mg via ORAL

## 2018-03-30 MED ORDER — PROCHLORPERAZINE MALEATE 10 MG PO TABS
ORAL_TABLET | ORAL | Status: AC
  Filled 2018-03-30: qty 1

## 2018-03-30 MED ORDER — DEXAMETHASONE 4 MG PO TABS: 4.0000 mg | ORAL_TABLET | Freq: Every day | ORAL | 1 refills | Status: DC

## 2018-03-30 MED ORDER — ATROPINE SULFATE 1 MG/ML IJ SOLN
INTRAMUSCULAR | Status: AC
  Filled 2018-03-30: qty 1

## 2018-03-30 MED ORDER — PALONOSETRON HCL INJECTION 0.25 MG/5ML
INTRAVENOUS | Status: AC
  Filled 2018-03-30: qty 5

## 2018-03-30 MED ORDER — SODIUM CHLORIDE 0.9 % IV SOLN
130.0000 mg/m2 | Freq: Once | INTRAVENOUS | Status: AC
  Administered 2018-03-30: 320 mg via INTRAVENOUS
  Filled 2018-03-30: qty 15

## 2018-03-30 MED ORDER — LIDOCAINE-PRILOCAINE 2.5-2.5 % EX CREA: 1.0000 "application " | TOPICAL_CREAM | CUTANEOUS | 2 refills | Status: DC | PRN

## 2018-03-30 MED ORDER — HEPARIN SOD (PORK) LOCK FLUSH 100 UNIT/ML IV SOLN
500.0000 [IU] | Freq: Once | INTRAVENOUS | Status: AC | PRN
  Administered 2018-03-30: 500 [IU]
  Filled 2018-03-30: qty 5

## 2018-03-30 MED ORDER — SODIUM CHLORIDE 0.9 % IV SOLN
600.0000 mg | Freq: Once | INTRAVENOUS | Status: AC
  Administered 2018-03-30: 600 mg via INTRAVENOUS
  Filled 2018-03-30: qty 16

## 2018-03-30 MED ORDER — PALONOSETRON HCL INJECTION 0.25 MG/5ML
0.2500 mg | Freq: Once | INTRAVENOUS | Status: AC
  Administered 2018-03-30: 0.25 mg via INTRAVENOUS

## 2018-03-30 MED ORDER — ATROPINE SULFATE 1 MG/ML IJ SOLN
0.5000 mg | Freq: Once | INTRAMUSCULAR | Status: AC | PRN
  Administered 2018-03-30: 0.5 mg via INTRAVENOUS

## 2018-03-30 MED ORDER — SODIUM CHLORIDE 0.9 % IV SOLN
Freq: Once | INTRAVENOUS | Status: AC
  Administered 2018-03-30: 15:00:00 via INTRAVENOUS

## 2018-03-30 MED ORDER — SODIUM CHLORIDE 0.9 % IV SOLN
Freq: Once | INTRAVENOUS | Status: AC
  Administered 2018-03-30: 15:00:00 via INTRAVENOUS
  Filled 2018-03-30: qty 5

## 2018-03-30 MED ORDER — SODIUM CHLORIDE 0.9% FLUSH
10.0000 mL | INTRAVENOUS | Status: DC | PRN
  Administered 2018-03-30: 10 mL
  Filled 2018-03-30: qty 10

## 2018-03-30 NOTE — Progress Notes (Signed)
[place]  [phone] [phone]  Clinic Follow up Note   Patient Care Team: [name], MD as PCP - General (Internal Medicine) Leighton Ruff, MD as Consulting Physician (General Surgery) Carol Ada, MD as Consulting Physician (Gastroenterology) Concha Norway, MD (Inactive) as Consulting Physician (Internal Medicine) Emily Filbert, MD as Consulting Physician (Obstetrics and Gynecology) Tania Ade, RN as Registered Nurse (Medical Oncology) Stark Klein, MD as Consulting Physician (General Surgery) [name], MD as Consulting Physician (Hematology) 03/30/2018  SUMMARY OF ONCOLOGIC HISTORY: Oncology History   Breast cancer of lower-outer quadrant of right female breast [facility])   Staging form: Breast, AJCC 7th Edition     Clinical stage from 02/01/2015: Stage IA (T1a, N0, M0) - Signed by [name], MD on 06/04/2015     Pathologic stage from 11/10/2015: Stage IA (T1b, N0, M0) - Signed by [name], MD on 12/08/2015 Cancer of sigmoid colon [facility])   Staging form: Colon and Rectum, AJCC 7th Edition     Clinical: Stage I (T2, N0, M0) - Unsigned     Breast cancer of 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      01/07/2014 Initial Diagnosis    Breast cancer      01/18/2015 Mammogram    33m mass in lower outer quadrant of right breast       02/01/2015 Initial Biopsy    (R) breast mass biopsy showed invasive ductal carcinoma & DCIS, grade 1-2.       02/01/2015 Receptors her2    ER 90%+, PR 10%+, Ki67 10%, HER2 negative (ratio 1.09)       11/07/2015 Surgery    (R) breast lumpectomy and sentinel lymph node biopsy ([name]      11/07/2015 Pathology Results    (R) breast lumpectomy showed invasive ductal carcinoma, grade 2, 1 cm, low-grade DCIS, negative margins, 3 sentinel lymph nodes were negative. LVI (-). HER2 repeated and remains  negative (ratio 1.29).        11/07/2015 Pathologic Stage    pT1b,pN0: Stage IA       11/07/2015 Oncotype testing    RS 22, which predicts a year risk of distant recurrence with tamoxifen alone 14% (intermediate-risk). Adjuvant chemo was not offered      02/21/2016 - 04/10/2016 Radiation Therapy    Adjuvant breast radiation. ([name]). Right breast: 50.4 Gy in 28 fractions.  Right breast boost: 12 Gy in 6 fractions.      05/2016 -  Anti-estrogen oral therapy    Femara (Letrozole) 2.5 mg daily.       10/17/2016 Mammogram    MM DIAG BREAST TOMO BILATERAL 10/17/2016 FINDINGS: There are bilateral lumpectomy changes in the upper central left breast and upper central right breast. No mass, nonsurgical distortion, or suspicious microcalcification is identified in either breast to suggest malignancy. Mammographic images were processed with CAD. IMPRESSION: No evidence of malignancy in either breast. Lumpectomy changes bilaterally. RECOMMENDATION: Diagnostic mammogram is suggested in 1 year. (Code:[id number])      12/16/2017 Mammogram    IMPRESSION: No mammographic evidence of malignancy in either breast, status post bilateral mastectomies. RECOMMENDATION: Diagnostic mammogram is suggested in 1 year.        Cancer of sigmoid colon ([place]   03/04/2014 Initial Diagnosis    Cancer of sigmoid colon      03/09/2014 Pathology Results    G1 invasive  adenocarcinoma, no lymph-Vascular invasion or Peri-neural invasion: Absent. MMR normal, MSI stable.       03/09/2014 Surgery    partial colectomy, negative margins.       11/10/2014 Relapse/Recurrence    biopsy confirmed oligo liver recurrence       11/10/2014 Imaging    PET showed two small ares of hypermetabolism in the right lobe of the liver, no other distant metastasis.       11/30/2014 Imaging    abdomen MRI showed a new enhancing lesion which corresponds to an area of abnormal hyper metabolism on recent PET-CT. Stable enlarged portal caval  lymph node.       01/02/2015 Pathology Results    Liver biopsy showed metastatic adenocarcinoma, IHC (+) for CK20, CDX-2, (-) for TTF-1 and ER      01/02/2015 Miscellaneous    liver biopsy KRAS mutation (+)       02/14/2015 Imaging    CT showed:  the small metastatic right hepatic lobe liver lesion is not identified for certain on this examination. No new lesions. Stable small cysts in segment 4 a.       03/05/2015 - 04/16/2015 Chemotherapy    CAPEOX (capecitabine 2500 mg twice daily Day 1-14, oxaliplatin 1 30 mg/m on day 1, every 21 days, S/P 3 cycles       08/24/2015 Procedure    CT-guided oligo liver metastasis microwave ablation by Dr. [name]      06/17/2016 Imaging    CT of the chest/abd/pelvis with contrast 1. Several new small pulmonary nodules worrisome for metastatic disease. 2. New large area of probable infiltrating recurrent tumor in the right hepatic lobe surrounding the prior ablation site. 3. Small supraclavicular lymph nodes.  Attention on future studies. 4. Stable surgical changes involving both breasts. No breast masses are identified. 5. Stable bilateral adrenal gland nodules. 6. Stable upper abdominal and right-sided retroperitoneal lymph nodes.      07/08/2016 PET scan    1. Large mass in the right lobe of the liver demonstrates diffuse hypermetabolism, compatible with recurrent metastatic disease in the liver 2. Multiple small pulmonary nodules are very similar to the recent Chest CT 06/17/16.  3. No other findings of metastatic disease elsewhere in the neck, chest, abdomen, or pelvis. 4. There is some low-level hypermetabolic activity adjacent to the surgical clips in the lateral aspect of the right breast at the site of prior lumpectomy.       07/29/2016 Pathology Results    Liver biopsy confirmed metastatic colon cancer       07/31/2016 - 07/31/2017 Chemotherapy    Chemotherapy FOLFIRI and Avastin (from cycle 2) and Leucovorin (added from cycle 8)  every 2 weeks, started on 07/31/2016, multiple delay per pt's request; chemo break from 5/30-6/21/18; decreased leucovorin to 2000 mg/m2, irinotecan to 150m/m2, 5-fu 20032mm2 from 03/05/17, due to poor tolerance. Started chemo break since 07/31/17.       10/23/2016 Imaging    CT ABDOMEN PELVIS W CONTRAST 10/24/16 IMPRESSION: 1. The numerous tiny bilateral pulmonary nodules seen on the previous imaging exams are no longer evident, suggesting response of therapy. 2. Large ill-defined irregular right hepatic lesion measures smaller on today's study. 3. Slight increase in size of hepatoduodenal ligament lymphadenopathy. 4. Abdominal Aortic Atherosclerois (ICD10-170.0)      01/05/2017 Imaging    CT CAP IMPRESSION: 1. Left lower lobe pulmonary nodule and hepatic metastatic disease are stable. 2. Hepatoduodenal ligament lymph node is stable. 3. Aortic atherosclerosis (ICD10-170.0). Coronary artery calcification.  4. Enlarged pulmonary arteries, indicative of pulmonary arterial hypertension. 5. Small bilateral adrenal nodules are unchanged.      05/09/2017 Imaging    CT CAP IMPRESSION: 1. Stable appearance left lower lobe pulmonary nodule with an apparent new tiny posterior right upper lobe pulmonary nodule. 2. Dominant ill-defined right hepatic mass measures smaller on today's study. 3. Slight decrease in the hepatoduodenal ligament lymph node. 4. Aortic Atherosclerois (ICD10-170.0)      05/09/2017 Imaging    CT CAP 05/09/17 IMPRESSION: 1. Stable appearance left lower lobe pulmonary nodule with an apparent new tiny posterior right upper lobe pulmonary nodule. Continued attention on follow-up recommended. 2. Dominant ill-defined right hepatic mass measures smaller on today's study. 3. Slight decrease in the hepatoduodenal ligament lymph node. 4.  Aortic Atherosclerois (ICD10-170.0)       08/11/2017 Imaging    CT CAP W Contrast 08/11/17 IMPRESSION: 1. Essentially stable appearance of  small pulmonary nodules, hypodense mass laterally in the right hepatic lobe, and mild portacaval adenopathy. No new or progressive lesions. 2. Other imaging findings of potential clinical significance: Aortic Atherosclerosis (ICD10-I70.0). Lower lumbar impingement due to spondylosis and degenerative disc disease. Uterine fibroids. Bilateral renal cysts. Coronary atherosclerosis. Mild mitral valve calcification. Mild cardiomegaly.        09/24/2017 - 12/04/2017 Chemotherapy    Maintenance Xeloda 2500 mg twice daily days 1 through 14 of each cycle of treatment and Avastin given every 3 weeks starting on 09/24/2017. Due to slight progression this was stopped on 12/04/17       12/02/2017 Imaging    CT CAP W Contrast 12/02/17 IMPRESSION: Chest Impression: 1. Interval increase in size of bilateral small pulmonary nodules. 2. No mediastinal adenopathy  Abdomen / Pelvis Impression: 1. Dominant lesion in the RIGHT hepatic lobe is similar; however there are 2 adjacent low-density lesions in the RIGHT hepatic lobe which are not appreciated on comparison exam and concerning recurrent hepatic metastasis. 2. Interval increase in size periaortic retroperitoneal lymph nodes consistent with recurrent metastatic adenopathy.      01/08/2018 -  Chemotherapy    Due to slight disease progression she restarted on Irinotecan plus Avastin every 2 weeks on 01/08/18. She declined 5-fu.      DIAGNOSIS: recurrent Cancer of sigmoid colon ([place]) Malignant neoplasm of lower-outer quadrant of right breast of female, estrogen receptor positive ([place]) Personal history of venous thrombosis and embolism Obesity, Class III, BMI 40-49.9 (morbid obesity) ([place]) Essential hypertension, benign  PROBLEM LIST 1. Left breast DCIS diagnosed in 2006 and 2010, status post lumpectomy. 2. pT2N0M0 stage I colon cancer, s/p partial colectomy on 03/09/2014  3. PE in 12/2013 when she was diagnosed with colon cancer. 4. Right breast  stage I breast cancer diagnosed in 01/2015  CURRENT TREATMENT:   1. Letrozole 2.5 mg once daily, started in September 2017 2. Irinotecan plus Avastin every 2 weeks started on 01/08/18  INTERVAL HISTORY: Ms. Rhyne returns for follow up and next cycle chemotherapy. She missed appointments/treatment last week due to being out of town. Completed last cycle on 03/12/18. She did well. She has significant fatigue after treatment that gradually improves. Today, energy level and appetite are normal. Denies n/v/c/d, rectal bleeding, cough, chest pain, dyspnea, neuropathy, fever, or chills. She has hot flashes related to letrozole. She has chronic intermittent right flank, newer right clavicular pain that developed 2-3 months ago. Managed with tylenol #3 PRN.   REVIEW OF SYSTEMS:   Constitutional: Denies fevers, chills or abnormal weight loss (+) fatigue after chemo  Eyes: Denies blurriness of vision Ears, nose, mouth, throat, and face: Denies mucositis or sore throat Respiratory: Denies cough, dyspnea or wheezes Cardiovascular: Denies palpitation, chest discomfort or lower extremity swelling Gastrointestinal:  Denies nausea, body, constipation, diarrhea, hematochezia, heartburn or change in bowel habits Skin: Denies abnormal skin rashes Lymphatics: Denies new lymphadenopathy or easy bruising Neurological:Denies numbness, tingling or new weaknesses (+) hot flashes Behavioral/Psych: Mood is stable, no new changes  MSK: (+) Right clavicle pain, 2-3 months All other systems were reviewed with the patient and are negative.  MEDICAL HISTORY:  Past Medical History:  Diagnosis Date  . Anxiety   . Breast CA ([place])    radiation and surgery-lt.  . Cancer ([place])    left breast cancer x2  . Hypertension   . Liver lesion   . Personal history of chemotherapy   . Personal history of radiation therapy 2017  . Pulmonary embolism ([place])    "blood clot in lungs" -dx. 4'15 with CT Chest. On Xarelto  .  Radiation 01/22/10-03/12/10   left whole breast 4500 cGy, upper aspect boosted to 6120 cGy  . Radiation 02/21/16 - 04/10/16   right breast 50.4 Gy, right breast boost 12 Gy  . Shortness of breath    sob with exertion. dx. with Pulmonary emboli 4'15 ,tx. Xarelto,Lovenox used for [employer].    SURGICAL HISTORY: Past Surgical History:  Procedure Laterality Date  . BREAST BIOPSY Left 2010   malignant  . BREAST BIOPSY Right 01/2015   malignant  . BREAST LUMPECTOMY Left 2006  . BREAST LUMPECTOMY Left 2010  . BREAST LUMPECTOMY Right 11/07/2015  . BREAST LUMPECTOMY WITH RADIOACTIVE SEED AND SENTINEL LYMPH NODE BIOPSY Right 11/07/2015   Procedure: BREAST LUMPECTOMY WITH RADIOACTIVE SEED AND SENTINEL LYMPH NODE BIOPSY;  Surgeon: Stark Klein, MD;  Location: [facility];  Service: General;  Laterality: Right;  . BREAST SURGERY     '11- Left breast lumpectomy  . CESAREAN SECTION    . CHOLECYSTECTOMY     Laparoscopic 20 yrs ago  . COLON SURGERY  03-09-14   partial colectomy for cancer  . IR GENERIC HISTORICAL  07/29/2016   IR FLUORO GUIDE PORT INSERTION RIGHT 07/29/2016 Sandi Mariscal, MD [facility]  . IR GENERIC HISTORICAL  07/29/2016   IR US GUIDE VASC ACCESS RIGHT 07/29/2016 Sandi Mariscal, MD [facility]  . IR GENERIC HISTORICAL  07/29/2016   IR US GUIDE BX ASP/DRAIN 07/29/2016 [facility]  . PARTIAL COLECTOMY N/A 03/09/2014   Procedure: LAPAROSCOPIC ASSISTED PARTIAL COLECTOMY, splenic flexure mobilization;  Surgeon: Leighton Ruff, MD;  Location: [facility];  Service: General;  Laterality: N/A;  . RADIOFREQUENCY ABLATION Right 08/24/2015   Procedure: MICROWAVE ABLATION RIGHT LIVER LOBE ;  Surgeon: Corrie Mckusick, DO;  Location: [facility];  Service: Anesthesiology;  Laterality: Right;    I have reviewed the social history and family history with the patient and they are unchanged from previous note.  ALLERGIES:  is allergic to tramadol.  MEDICATIONS:  Current Outpatient Medications    Medication Sig Dispense Refill  . acetaminophen-codeine (TYLENOL #4) 300-60 MG tablet Take 1 tablet by mouth every 8 (eight) hours as needed. for pain 25 tablet 0  . amLODipine (NORVASC) 5 MG tablet Take 1 tablet (5 mg total) by mouth daily. 30 tablet 0  . hydrochlorothiazide (HYDRODIURIL) 25 MG tablet Take 1 tablet (25 mg total) by mouth every morning. 30 tablet 1  . letrozole (FEMARA) 2.5 MG tablet TAKE 1 TABLET BY MOUTH EVERY DAY 30  tablet 3  . lidocaine-prilocaine (EMLA) cream Apply 1 application topically as needed. 30 g 2  . LORazepam (ATIVAN) 0.5 MG tablet Take 1 tablet (0.5 mg total) by mouth every 8 (eight) hours. 30 tablet 0  . magic mouthwash w/lidocaine SOLN Take 5 mLs by mouth 4 (four) times daily as needed for mouth pain. 240 mL 1  . metoprolol tartrate (LOPRESSOR) 50 MG tablet Take 1 tablet (50 mg total) by mouth 2 (two) times daily. 60 tablet 1  . metoprolol tartrate (LOPRESSOR) 50 MG tablet TAKE 1 TABLET BY MOUTH 2 TIMES DAILY 60 tablet 2  . ondansetron (ZOFRAN) 8 MG tablet Take 1 tablet (8 mg total) by mouth 2 (two) times daily as needed for refractory nausea / vomiting. Start on day 3 after chemotherapy. 30 tablet 1  . potassium chloride SA (K-DUR,KLOR-CON) 20 MEQ tablet TAKE 1 TABLET BY MOUTH 2 TIMES DAILY 60 tablet 2  . prochlorperazine (COMPAZINE) 10 MG tablet Take 1 tablet (10 mg total) by mouth every 6 (six) hours as needed (NAUSEA). 30 tablet 2  . promethazine (PHENERGAN) 25 MG tablet Take 1 tablet (25 mg total) by mouth every 6 (six) hours as needed for nausea or vomiting. 30 tablet 2  . XARELTO 20 MG TABS tablet TAKE 1 TABLET BY MOUTH EVERY DAY 30 tablet 3  . zolpidem (AMBIEN) 5 MG tablet Take 1 tablet (5 mg total) by mouth at bedtime as needed for sleep. 20 tablet 0  . azithromycin (ZITHROMAX) 500 MG tablet Take 1 tablet (500 mg total) by mouth daily. 5 tablet 0  . dexamethasone (DECADRON) 4 MG tablet Take 1 tablet (4 mg total) by mouth daily. For 3-5 days after  chemotherapy 10 tablet 1  . ferrous sulfate 325 (65 FE) MG tablet Take 325 mg by mouth daily with breakfast. Pt takes  4 times per week.     No current facility-administered medications for this visit.    Facility-Administered Medications Ordered in Other Visits  Medication Dose Route Frequency Provider Last Rate Last Dose  . sodium chloride flush (NS) 0.9 % injection 10 mL  10 mL Intracatheter PRN [name], MD   10 mL at 03/30/18 1746    PHYSICAL EXAMINATION: ECOG PERFORMANCE STATUS: 1 - Symptomatic but completely ambulatory BP (!) 142/86 (BP Location: Left Arm, Patient Position: Sitting) Comment: Santiago Glad LPN is aware  Pulse 77   Temp 98.6 F (37 C) (Oral)   Resp 18   Ht 5' 6" (1.676 m)   Wt 288 lb 6.4 oz (130.8 kg)   LMP 01/23/2014   SpO2 98%   BMI 46.55 kg/m   GENERAL:alert, no distress and comfortable SKIN: no rashes or significant lesions EYES: normal, Conjunctiva are pink and non-injected, sclera clear OROPHARYNX: no thrush or ulcers   LYMPH:  no palpable cervical or supraclavicular lymphadenopathy  LUNGS: clear to auscultation and percussion with normal breathing effort HEART: regular rate & rhythm and no murmurs and no lower extremity edema ABDOMEN:abdomen soft, non-tender and normal bowel sounds Musculoskeletal:no cyanosis of digits and no clubbing. Mild tenderness to right clavicle without palpable abnormality  NEURO: alert & oriented x 3 with fluent speech, no focal motor/sensory deficits PAC without erythema   LABORATORY DATA:  I have reviewed the data as listed CBC Latest Ref Rng & Units 03/30/2018 03/12/2018 02/19/2018  WBC 3.9 - 10.3 K/uL 7.4 7.8 11.9(H)  Hemoglobin 11.6 - 15.9 g/dL 13.0 13.0 12.7  Hematocrit 34.8 - 46.6 % 39.0 39.1 38.2  Platelets 145 -  400 K/uL 312 258 341     CMP Latest Ref Rng & Units 03/30/2018 03/12/2018 02/19/2018  Glucose 70 - 99 mg/dL 95 122(H) 109  BUN 8 - 23 mg/dL _0 Creatinine 0.44 - 1.00 mg/dL 0.77 0.83 0.79  Sodium 135 - 145  mmol/L 140 141 139  Potassium 3.5 - 5.1 mmol/L 3.7 4.0 3.6  Chloride 98 - 111 mmol/L 104 105 104  CO2 22 - 32 mmol/L _1 Calcium 8.9 - 10.3 mg/dL 9.9 9.4 9.7  Total Protein 6.5 - 8.1 g/dL 7.9 7.5 7.7  Total Bilirubin 0.3 - 1.2 mg/dL 0.5 0.9 0.5  Alkaline Phos 38 - 126 U/L 73 63 112  AST 15 - 41 U/L 37 43(H) 26  ALT 0 - 44 U/L 29 32 27   CEA  01/18/2014: 0.8 03/01/2015: 1.3 01/31/2016: 4.8 05/28/2016: 48 07/31/2016: 173.7 08/28/16: 139 09/25/2016: 43 11/04/16: 9.94 12/18/2016: 6.99 01/29/17: 4.58 03/04/17: 6.09 04/09/17: 4.22 05/07/17: 5.29 06/04/17: 4.12 07/02/17: 6.78 07/30/17 : 5.35  09/24/17: 5.41 10/23/17: 8.03 11/13/17: 12.32 01/01/2018: 27.70 02/05/2018: 39.93 03/12/18: 36.22  PATHOLOGY RESULT Diagnosis 01/02/2015 Liver, needle/core biopsy, right - POSITIVE FOR METASTATIC ADENOCARCINOMA. - SEE COMMENT.   Diagnosis 11/07/2015 1. Breast, lumpectomy, Right - INVASIVE DUCTAL CARCINOMA, GRADE 2, SPANNING 1 CM. - DUCTAL CARCINOMA IN SITU, LOW GRADE. - RESECTION MARGINS ARE NEGATIVE FOR INVASIVE CARCINOMA. - DUCTAL CARCINOMA IN SITU COMES TO WITHIN 0.3 CM OF THE POSTERIOR MARGIN. - BIOPSY SITE. - SEE ONCOLOGY TABLE. 2. Lymph node, sentinel, biopsy, right axillary #1 - ONE OF ONE LYMPH NODES NEGATIVE FOR CARCINOMA (0/1). 3. Lymph node, sentinel, biopsy, right axillary #2 - ONE OF ONE LYMPH NODES NEGATIVE FOR CARCINOMA (0/1). 4. Lymph node, sentinel, biopsy, right axillary #3 - ONE OF ONE LYMPH NODES NEGATIVE FOR CARCINOMA (0/1).  ONCOTYPE DX: RS 64, which predicts a year risk of distant recurrence with tamoxifen alone 14%   Diagnosis 07/29/2016 Liver, needle/core biopsy, posterior of right lobe METASTATIC ADENOCARCINOMA, CONSISTENT WITH COLONIC PRIMARY.   RADIOGRAPHIC STUDIES: I have personally reviewed the radiological images as listed and agreed with the findings in the report. No results found.   ASSESSMENT & PLAN: Veronica Reyes 62 y.o. female    1. Colon Cancer, Stage I (pT2N0), MMR normal, oligo recurrent disease in liver in 12/2014, KRAS mutation (+), liver and lung recurrence in 06/2016 2. Right breast cancer, pT1bN0M0, stage Ia, ER+/PR+/HER2-, History of left Breast Cancer, diagnosed on 02/01/2015 3. History of PE (01/06/14) 4. Uterine fibroids 5. R kidney lesion  6. Right flank pain, knee discomfort  7. Morbid obesity  8. HTN 9. Hypokalemia  10. Acute bronchitis   Veronica Reyes appears stable. She completed cycle 4 irinotecan/avastin. She tolerates treatment moderately well overall, except fatigue that is often severe. She tends to postpone treatment when not feeling well. She was delayed last week due to being out of town. She is doing well today and ready to resume treatment. She responds well to short course of decadron after treatment, will continue 4 mg daily x3-5 days after treatment. I refilled today.   Labs reviewed, CBC and CMP are stable, adequate to proceed with cycle 5 today. CEA is stable. Will restage after cycle 5. F/u in 2 weeks to review imaging and next cycle.   PLAN -Labs reviewed, proceed with cycle 5 irinotecan/avastin today -Restaging CT CAP after this cycle -F/u in 2 weeks for results and next cycle  -Refilled EMLA, decadron   All questions  were answered. The patient knows to call the clinic with any problems, questions or concerns. No barriers to learning was detected.     Alla Feeling, NP 03/30/18

## 2018-03-30 NOTE — Patient Instructions (Signed)
[facility] Discharge Instructions for Patients Receiving Chemotherapy  Today you received the following chemotherapy agents :  Irinotecan,  Avastin.  To help prevent nausea and vomiting after your treatment, we encourage you to take your nausea medication as prescribed.   If you develop nausea and vomiting that is not controlled by your nausea medication, call the clinic.   BELOW ARE SYMPTOMS THAT SHOULD BE REPORTED IMMEDIATELY:  *FEVER GREATER THAN 100.5 F  *CHILLS WITH OR WITHOUT FEVER  NAUSEA AND VOMITING THAT IS NOT CONTROLLED WITH YOUR NAUSEA MEDICATION  *UNUSUAL SHORTNESS OF BREATH  *UNUSUAL BRUISING OR BLEEDING  TENDERNESS IN MOUTH AND THROAT WITH OR WITHOUT PRESENCE OF ULCERS  *URINARY PROBLEMS  *BOWEL PROBLEMS  UNUSUAL RASH Items with * indicate a potential emergency and should be followed up as soon as possible.  Feel free to call the clinic should you have any questions or concerns. The clinic phone number is [phone].  Please show the [facility] at check-in to the Emergency Department and triage nurse.

## 2018-03-31 ENCOUNTER — Telehealth: Payer: Self-pay

## 2018-03-31 NOTE — Telephone Encounter (Signed)
Called patient with CT scan appointment for Monday 7/22 to arrive at [facility] at 10:15 NPO 4 hours prior, her husband will pick up contrast today

## 2018-04-02 ENCOUNTER — Other Ambulatory Visit: Payer: BLUE CROSS/BLUE SHIELD

## 2018-04-02 ENCOUNTER — Ambulatory Visit: Payer: BLUE CROSS/BLUE SHIELD | Admitting: Nurse Practitioner

## 2018-04-02 ENCOUNTER — Telehealth: Payer: Self-pay

## 2018-04-02 ENCOUNTER — Ambulatory Visit: Payer: BLUE CROSS/BLUE SHIELD

## 2018-04-02 NOTE — Telephone Encounter (Signed)
Tried contacting patient again concerning the changes in her current schedule. Unable to leave a voice mail, will mail a letter, with a calender enclosed. per 7/16 los

## 2018-04-05 ENCOUNTER — Ambulatory Visit (HOSPITAL_COMMUNITY): Payer: BLUE CROSS/BLUE SHIELD

## 2018-04-05 NOTE — Progress Notes (Signed)
[facility]  [phone]) [phone] [phone]) [phone]  Clinic Follow Up Note   DATE OF VISIT: 04/13/2018   Veronica Docker, MD [address] [place] [address]  DIAGNOSIS: recurrent Cancer of sigmoid colon ([place]) - Plan: DISCONTINUED: 0.9 %  sodium chloride infusion, DISCONTINUED: sodium chloride flush (NS) 0.9 % injection 10 mL, DISCONTINUED: heparin lock flush 100 unit/mL, DISCONTINUED: atropine injection 0.5 mg, DISCONTINUED: fosaprepitant (EMEND) 150 mg, dexamethasone (DECADRON) 10 mg in sodium chloride 0.9 % 145 mL IVPB, DISCONTINUED: prochlorperazine (COMPAZINE) tablet 10 mg, DISCONTINUED: palonosetron (ALOXI) injection 0.25 mg, DISCONTINUED: leucovorin 976 mg in dextrose 5 % 250 mL infusion, DISCONTINUED: fluorouracil (ADRUCIL) chemo injection 1,000 mg, DISCONTINUED: bevacizumab (AVASTIN) 600 mg in sodium chloride 0.9 % 100 mL chemo infusion, DISCONTINUED: irinotecan (CAMPTOSAR) 320 mg in dextrose 5 % 500 mL chemo infusion  Malignant neoplasm of lower-outer quadrant of right breast of female, estrogen receptor positive ([name])  Personal history of venous thrombosis and embolism  Obesity, Class III, BMI 40-49.9 (morbid obesity) ([place])  Essential hypertension, benign  PROBLEM LIST 1. Left breast DCIS diagnosed in 2006 and 2010, status post lumpectomy. 2. pT2N0M0 stage I colon cancer, s/p partial colectomy on 03/09/2014  3. PE in 12/2013 when she was diagnosed with colon cancer. 4. 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 [facility])   Staging form: Breast, AJCC 7th Edition     Clinical stage from 02/01/2015: Stage IA (T1a, N0, M0) - Signed by [name], MD on 06/04/2015     Pathologic stage from 11/10/2015: Stage IA (T1b, N0, M0) - Signed by [name], MD on 12/08/2015 Cancer of sigmoid colon [facility])   Staging form: Colon and Rectum, AJCC 7th Edition     Clinical:  Stage I (T2, N0, M0) - Unsigned     Breast cancer of lower-outer quadrant of right female breast ([place])   2006 Cancer Diagnosis    Left breast DCIS diagnosed in 2006 and 2010, status post lumpectomy.      01/07/2014 Initial Diagnosis    Breast cancer      01/18/2015 Mammogram    70m mass in lower outer quadrant of right breast       02/01/2015 Initial Biopsy    (R) breast mass biopsy showed invasive ductal carcinoma & DCIS, grade 1-2.       02/01/2015 Receptors her2    ER 90%+, PR 10%+, Ki67 10%, HER2 negative (ratio 1.09)       11/07/2015 Surgery    (R) breast lumpectomy and sentinel lymph node biopsy ([name]      11/07/2015 Pathology Results    (R) breast lumpectomy showed invasive ductal carcinoma, grade 2, 1 cm, low-grade DCIS, negative margins, 3 sentinel lymph nodes were negative. LVI (-). HER2 repeated and remains negative (ratio 1.29).        11/07/2015 Pathologic Stage    pT1b,pN0: Stage IA       11/07/2015 Oncotype testing    RS 22, which predicts a year risk of distant recurrence with tamoxifen alone 14% (intermediate-risk). Adjuvant chemo was not offered      02/21/2016 - 04/10/2016 Radiation Therapy    Adjuvant breast radiation. ([name]). Right breast: 50.4 Gy in 28 fractions.  Right breast boost: 12 Gy in 6 fractions.      05/2016 -  Anti-estrogen oral therapy    Femara (Letrozole) 2.5 mg daily.  10/17/2016 Mammogram    MM DIAG BREAST TOMO BILATERAL 10/17/2016 FINDINGS: There are bilateral lumpectomy changes in the upper central left breast and upper central right breast. No mass, nonsurgical distortion, or suspicious microcalcification is identified in either breast to suggest malignancy. Mammographic images were processed with CAD. IMPRESSION: No evidence of malignancy in either breast. Lumpectomy changes bilaterally. RECOMMENDATION: Diagnostic mammogram is suggested in 1 year. (Code:DM-B-01Y)      12/16/2017 Mammogram    IMPRESSION: No mammographic  evidence of malignancy in either breast, status post bilateral mastectomies. RECOMMENDATION: Diagnostic mammogram is suggested in 1 year.        Cancer of sigmoid colon ([place])   03/04/2014 Initial Diagnosis    Cancer of sigmoid colon      03/09/2014 Pathology Results    G1 invasive adenocarcinoma, no lymph-Vascular invasion or Peri-neural invasion: Absent. MMR normal, MSI stable.       03/09/2014 Surgery    partial colectomy, negative margins.       11/10/2014 Relapse/Recurrence    biopsy confirmed oligo liver recurrence       11/10/2014 Imaging    PET showed two small ares of hypermetabolism in the right lobe of the liver, no other distant metastasis.       11/30/2014 Imaging    abdomen MRI showed a new enhancing lesion which corresponds to an area of abnormal hyper metabolism on recent PET-CT. Stable enlarged portal caval lymph node.       01/02/2015 Pathology Results    Liver biopsy showed metastatic adenocarcinoma, IHC (+) for CK20, CDX-2, (-) for TTF-1 and ER      01/02/2015 Miscellaneous    liver biopsy KRAS mutation (+)       02/14/2015 Imaging    CT showed:  the small metastatic right hepatic lobe liver lesion is not identified for certain on this examination. No new lesions. Stable small cysts in segment 4 a.       03/05/2015 - 04/16/2015 Chemotherapy    CAPEOX (capecitabine 2500 mg twice daily Day 1-14, oxaliplatin 1 30 mg/m on day 1, every 21 days, S/P 3 cycles       08/24/2015 Procedure    CT-guided oligo liver metastasis microwave ablation by Dr. [name]      06/17/2016 Imaging    CT of the chest/abd/pelvis with contrast 1. Several new small pulmonary nodules worrisome for metastatic disease. 2. New large area of probable infiltrating recurrent tumor in the right hepatic lobe surrounding the prior ablation site. 3. Small supraclavicular lymph nodes.  Attention on future studies. 4. Stable surgical changes involving both breasts. No breast masses are  identified. 5. Stable bilateral adrenal gland nodules. 6. Stable upper abdominal and right-sided retroperitoneal lymph nodes.      07/08/2016 PET scan    1. Large mass in the right lobe of the liver demonstrates diffuse hypermetabolism, compatible with recurrent metastatic disease in the liver 2. Multiple small pulmonary nodules are very similar to the recent Chest CT 06/17/16.  3. No other findings of metastatic disease elsewhere in the neck, chest, abdomen, or pelvis. 4. There is some low-level hypermetabolic activity adjacent to the surgical clips in the lateral aspect of the right breast at the site of prior lumpectomy.       07/29/2016 Pathology Results    Liver biopsy confirmed metastatic colon cancer       07/31/2016 - 07/31/2017 Chemotherapy    Chemotherapy FOLFIRI and Avastin (from cycle 2) and Leucovorin (added from cycle 8) every 2 weeks,  started on 07/31/2016, multiple delay per pt's request; chemo break from 5/30-6/21/18; decreased leucovorin to 2000 mg/m2, irinotecan to 153m/m2, 5-fu 20059mm2 from 03/05/17, due to poor tolerance. Started chemo break since 07/31/17.       10/23/2016 Imaging    CT ABDOMEN PELVIS W CONTRAST 10/24/16 IMPRESSION: 1. The numerous tiny bilateral pulmonary nodules seen on the previous imaging exams are no longer evident, suggesting response of therapy. 2. Large ill-defined irregular right hepatic lesion measures smaller on today's study. 3. Slight increase in size of hepatoduodenal ligament lymphadenopathy. 4. Abdominal Aortic Atherosclerois (ICD10-170.0)      01/05/2017 Imaging    CT CAP IMPRESSION: 1. Left lower lobe pulmonary nodule and hepatic metastatic disease are stable. 2. Hepatoduodenal ligament lymph node is stable. 3. Aortic atherosclerosis (ICD10-170.0). Coronary artery calcification. 4. Enlarged pulmonary arteries, indicative of pulmonary arterial hypertension. 5. Small bilateral adrenal nodules are unchanged.      05/09/2017  Imaging    CT CAP IMPRESSION: 1. Stable appearance left lower lobe pulmonary nodule with an apparent new tiny posterior right upper lobe pulmonary nodule. 2. Dominant ill-defined right hepatic mass measures smaller on today's study. 3. Slight decrease in the hepatoduodenal ligament lymph node. 4. Aortic Atherosclerois (ICD10-170.0)      05/09/2017 Imaging    CT CAP 05/09/17 IMPRESSION: 1. Stable appearance left lower lobe pulmonary nodule with an apparent new tiny posterior right upper lobe pulmonary nodule. Continued attention on follow-up recommended. 2. Dominant ill-defined right hepatic mass measures smaller on today's study. 3. Slight decrease in the hepatoduodenal ligament lymph node. 4.  Aortic Atherosclerois (ICD10-170.0)       08/11/2017 Imaging    CT CAP W Contrast 08/11/17 IMPRESSION: 1. Essentially stable appearance of small pulmonary nodules, hypodense mass laterally in the right hepatic lobe, and mild portacaval adenopathy. No new or progressive lesions. 2. Other imaging findings of potential clinical significance: Aortic Atherosclerosis (ICD10-I70.0). Lower lumbar impingement due to spondylosis and degenerative disc disease. Uterine fibroids. Bilateral renal cysts. Coronary atherosclerosis. Mild mitral valve calcification. Mild cardiomegaly.        09/24/2017 - 12/04/2017 Chemotherapy    Maintenance Xeloda 2500 mg twice daily days 1 through 14 of each cycle of treatment and Avastin given every 3 weeks starting on 09/24/2017. Due to slight progression this was stopped on 12/04/17       12/02/2017 Imaging    CT CAP W Contrast 12/02/17 IMPRESSION: Chest Impression: 1. Interval increase in size of bilateral small pulmonary nodules. 2. No mediastinal adenopathy  Abdomen / Pelvis Impression: 1. Dominant lesion in the RIGHT hepatic lobe is similar; however there are 2 adjacent low-density lesions in the RIGHT hepatic lobe which are not appreciated on comparison exam  and concerning recurrent hepatic metastasis. 2. Interval increase in size periaortic retroperitoneal lymph nodes consistent with recurrent metastatic adenopathy.      01/08/2018 -  Chemotherapy    Due to slight disease progression she restarted on Irinotecan plus Avastin every 2 weeks on 01/08/18. She declined 5-fu.       04/09/2018 Imaging    04/09/2018 CT CAP W Contrast IMPRESSION: 1. Slight interval increase in size as well as development of new low-attenuation lesions within the liver. 2. Grossly unchanged bilateral pulmonary nodules. 3. Similar-appearing retroperitoneal adenopathy. 4. Small amount of gas within the endometrium of the uterus, nonspecific in etiology. Recommend clinical correlation for the possibility of infectious symptoms. Consider further evaluation with pelvic ultrasound as indicated.       CURRENT TREATMENT:   1.  Letrozole 2.5 mg once daily, started in September 2017 2. Irinotecan plus Avastin every 2 weeks started on 01/08/18, 5-fu bolus every 2 weeks added on 04/13/18, pt declined 5-fu pump infusion    INTERIM HISTORY:   YURIKA PEREDA 62 y.o. female with a history of recurrent left breast cancer, colon cancer s/p laparoscopic-assisted partial colectomy (03/09/2014), PE (01/06/2014), and right breast cancer (01/2015), presents to clinic for follow up on disease progression. She presents to the clinic today by herself. She states that her pain is improving after taking her pain medications and chemotherapy. It used to be 6/10, and is now better. She gained 10 Ibs in the last month. She reports eating a lot of candy.  She reports intolerance to the pump, and would like to try bolus instead. Her ankles started swelling last week. She also reported an episode of dysphagia that caused chest discomfort last week for which she took some water that relieved it.    MEDICAL HISTORY: Past Medical History:  Diagnosis Date  . Anxiety   . Breast CA ([place])     radiation and surgery-lt.  . Cancer ([place])    left breast cancer x2  . Hypertension   . Liver lesion   . Personal history of chemotherapy   . Personal history of radiation therapy 2017  . Pulmonary embolism ([place])    "blood clot in lungs" -dx. 4'15 with CT Chest. On Xarelto  . Radiation 01/22/10-03/12/10   left whole breast 4500 cGy, upper aspect boosted to 6120 cGy  . Radiation 02/21/16 - 04/10/16   right breast 50.4 Gy, right breast boost 12 Gy  . Shortness of breath    sob with exertion. dx. with Pulmonary emboli 4'15 ,tx. Xarelto,Lovenox used for [employer].    ALLERGIES:  is allergic to tramadol.  MEDICATIONS:  Allergies as of 04/13/2018      Reactions   Tramadol Other (See Comments)   Dizziness      Medication List        Accurate as of 04/13/18  2:44 PM. Always use your most recent med list.          acetaminophen-codeine 300-60 MG tablet Commonly known as:  TYLENOL #4 Take 1 tablet by mouth every 8 (eight) hours as needed. for pain   amLODipine 5 MG tablet Commonly known as:  NORVASC Take 1 tablet (5 mg total) by mouth daily.   azithromycin 500 MG tablet Commonly known as:  ZITHROMAX Take 1 tablet (500 mg total) by mouth daily.   dexamethasone 4 MG tablet Commonly known as:  DECADRON Take 1 tablet (4 mg total) by mouth daily. For 3-5 days after chemotherapy   ferrous sulfate 325 (65 FE) MG tablet Take 325 mg by mouth daily with breakfast. Pt takes  4 times per week.   hydrochlorothiazide 25 MG tablet Commonly known as:  HYDRODIURIL TAKE 1 TABLET BY MOUTH EVERY IN THE MORNING   letrozole 2.5 MG tablet Commonly known as:  FEMARA TAKE 1 TABLET BY MOUTH EVERY DAY   lidocaine-prilocaine cream Commonly known as:  EMLA Apply 1 application topically as needed.   LORazepam 0.5 MG tablet Commonly known as:  ATIVAN Take 1 tablet (0.5 mg total) by mouth every 8 (eight) hours.   magic mouthwash w/lidocaine Soln Take 5 mLs by mouth 4 (four) times daily as needed for  mouth pain.   metoprolol tartrate 50 MG tablet Commonly known as:  LOPRESSOR Take 1 tablet (50 mg total) by mouth 2 (two) times  daily.   metoprolol tartrate 50 MG tablet Commonly known as:  LOPRESSOR TAKE 1 TABLET BY MOUTH 2 TIMES DAILY   ondansetron 8 MG tablet Commonly known as:  ZOFRAN Take 1 tablet (8 mg total) by mouth 2 (two) times daily as needed for refractory nausea / vomiting. Start on day 3 after chemotherapy.   potassium chloride SA 20 MEQ tablet Commonly known as:  K-DUR,KLOR-CON TAKE 1 TABLET BY MOUTH 2 TIMES DAILY   prochlorperazine 10 MG tablet Commonly known as:  COMPAZINE Take 1 tablet (10 mg total) by mouth every 6 (six) hours as needed (NAUSEA).   promethazine 25 MG tablet Commonly known as:  PHENERGAN Take 1 tablet (25 mg total) by mouth every 6 (six) hours as needed for nausea or vomiting.   XARELTO 20 MG Tabs tablet Generic drug:  rivaroxaban TAKE 1 TABLET BY MOUTH EVERY DAY   zolpidem 5 MG tablet Commonly known as:  AMBIEN Take 1 tablet (5 mg total) by mouth at bedtime as needed for sleep.       SURGICAL HISTORY:  Past Surgical History:  Procedure Laterality Date  . BREAST BIOPSY Left 2010   malignant  . BREAST BIOPSY Right 01/2015   malignant  . BREAST LUMPECTOMY Left 2006  . BREAST LUMPECTOMY Left 2010  . BREAST LUMPECTOMY Right 11/07/2015  . BREAST LUMPECTOMY WITH RADIOACTIVE SEED AND SENTINEL LYMPH NODE BIOPSY Right 11/07/2015   Procedure: BREAST LUMPECTOMY WITH RADIOACTIVE SEED AND SENTINEL LYMPH NODE BIOPSY;  Surgeon: Stark Klein, MD;  Location: [facility];  Service: General;  Laterality: Right;  . BREAST SURGERY     '11- Left breast lumpectomy  . CESAREAN SECTION    . CHOLECYSTECTOMY     Laparoscopic 20 yrs ago  . COLON SURGERY  03-09-14   partial colectomy for cancer  . IR GENERIC HISTORICAL  07/29/2016   IR FLUORO GUIDE PORT INSERTION RIGHT 07/29/2016 Sandi Mariscal, MD [facility]  . IR GENERIC HISTORICAL   07/29/2016   IR US GUIDE VASC ACCESS RIGHT 07/29/2016 Sandi Mariscal, MD [facility]  . IR GENERIC HISTORICAL  07/29/2016   IR US GUIDE BX ASP/DRAIN 07/29/2016 [facility]  . PARTIAL COLECTOMY N/A 03/09/2014   Procedure: LAPAROSCOPIC ASSISTED PARTIAL COLECTOMY, splenic flexure mobilization;  Surgeon: Leighton Ruff, MD;  Location: [facility];  Service: General;  Laterality: N/A;  . RADIOFREQUENCY ABLATION Right 08/24/2015   Procedure: MICROWAVE ABLATION RIGHT LIVER LOBE ;  Surgeon: Corrie Mckusick, DO;  Location: [facility];  Service: Anesthesiology;  Laterality: Right;    REVIEW OF SYSTEMS:  Constitutional: Denies fevers, chills or abnormal weight loss (+) improving pain Eyes: Denies blurriness of vision Ears, nose, mouth, throat, and face: Denies mucositis.  Respiratory: Denies cough, dyspnea or wheezes   Cardiovascular: Denies palpitation, chest discomfort or lower extremity swelling Gastrointestinal:  Denies heartburn or change in bowel habits. (+) one episode of dysphagia   Skin: Denies abnormal skin rashes Musculoskeletal: Denies concerning joint pains.  Lymphatics: Denies new lymphadenopathy or easy bruising Neurological:Denies numbness, tingling or new weaknesses Musculoskeletal: (+) left knee discomfort (+) bilateral ankle swelling Behavioral/Psych: Mood is stable, no new changes  All other systems were reviewed with the patient and are negative.    PHYSICAL EXAMINATION:   ECOG PERFORMANCE STATUS: 1 Blood pressure (!) 167/91, pulse 92, temperature 98.4 F (36.9 C), temperature source Oral, resp. rate 19, height '5\' 6"'  (1.676 m), weight 296 lb 3.2 oz (134.4 kg), last menstrual period 01/23/2014, SpO2 100 %.  Vitals:  04/13/18 1025  BP: (!) 167/91  Pulse: 92  Resp: 19  Temp: 98.4 F (36.9 C)  TempSrc: Oral  SpO2: 100%  Weight: 296 lb 3.2 oz (134.4 kg)  Height: '5\' 6"'  (1.676 m)    GENERAL:alert, no distress and comfortable; well developed and obese. Easily mobile to exam table.    SKIN: skin color, texture, turgor are normal, no rashes or significant lesions EYES: normal, Conjunctiva are pink and non-injected, sclera clear OROPHARYNX:no exudate, no erythema and lips, buccal mucosa, and tongue normal  NECK: supple, thyroid normal size, non-tender, without nodularity LYMPH:  no palpable lymphadenopathy in the cervical, axillary or supraclavicular LUNGS: clear to auscultation with normal breathing effort, no wheezes or rhonchi  HEART: regular rate & rhythm and no murmurs and no lower extremity edema ABDOMEN:abdomen soft, non-tender and normal bowel sounds; incision healing with signs of infection.  Mild hepatomegaly with tenderness at the RUQ, no abdominal tenderness. No local megaly Musculoskeletal:no cyanosis of digits and no clubbing, limited ROM of left shoulder (+) mild bilateral ankle swelling NEURO: alert & oriented x 3 with fluent speech, no focal motor/sensory deficits  Labs:  CBC Latest Ref Rng & Units 04/13/2018 03/30/2018 03/12/2018  WBC 3.9 - 10.3 K/uL 13.8(H) 7.4 7.8  Hemoglobin 11.6 - 15.9 g/dL 13.1 13.0 13.0  Hematocrit 34.8 - 46.6 % 40.4 39.0 39.1  Platelets 145 - 400 K/uL 302 312 258    CMP Latest Ref Rng & Units 04/13/2018 03/30/2018 03/12/2018  Glucose 70 - 99 mg/dL 134(H) 95 122(H)  BUN 8 - 23 mg/dL '17 10 9  ' Creatinine 0.44 - 1.00 mg/dL 0.79 0.77 0.83  Sodium 135 - 145 mmol/L 138 140 141  Potassium 3.5 - 5.1 mmol/L 3.6 3.7 4.0  Chloride 98 - 111 mmol/L 103 104 105  CO2 22 - 32 mmol/L '25 28 26  ' Calcium 8.9 - 10.3 mg/dL 9.5 9.9 9.4  Total Protein 6.5 - 8.1 g/dL 7.6 7.9 7.5  Total Bilirubin 0.3 - 1.2 mg/dL 0.3 0.5 0.9  Alkaline Phos 38 - 126 U/L 62 73 63  AST 15 - 41 U/L 23 37 43(H)  ALT 0 - 44 U/L 25 29 32    CEA  01/18/2014: 0.8 03/01/2015: 1.3 01/31/2016: 4.8 05/28/2016: 48 07/31/2016: 173.7 08/28/16: 139 09/25/2016: 43 11/04/16: 9.94 12/18/2016: 6.99 01/29/17: 4.58 03/04/17: 6.09 04/09/17: 4.22 05/07/17: 5.29 06/04/17: 4.12 07/02/17:  6.78 07/30/17 : 5.35  09/24/17: 5.41 10/23/17: 8.03 11/13/17: 12.32 01/01/2018: 27.70 02/05/2018: 39.93 03/12/18: 36.22  04/13/2018: PENDING  PATHOLOGY RESULT Diagnosis 01/02/2015 Liver, needle/core biopsy, right - POSITIVE FOR METASTATIC ADENOCARCINOMA. - SEE COMMENT.   Diagnosis 11/07/2015 1. Breast, lumpectomy, Right - INVASIVE DUCTAL CARCINOMA, GRADE 2, SPANNING 1 CM. - DUCTAL CARCINOMA IN SITU, LOW GRADE. - RESECTION MARGINS ARE NEGATIVE FOR INVASIVE CARCINOMA. - DUCTAL CARCINOMA IN SITU COMES TO WITHIN 0.3 CM OF THE POSTERIOR MARGIN. - BIOPSY SITE. - SEE ONCOLOGY TABLE. 2. Lymph node, sentinel, biopsy, right axillary #1 - ONE OF ONE LYMPH NODES NEGATIVE FOR CARCINOMA (0/1). 3. Lymph node, sentinel, biopsy, right axillary #2 - ONE OF ONE LYMPH NODES NEGATIVE FOR CARCINOMA (0/1). 4. Lymph node, sentinel, biopsy, right axillary #3 - ONE OF ONE LYMPH NODES NEGATIVE FOR CARCINOMA (0/1).  ONCOTYPE DX: RS 14, which predicts a year risk of distant recurrence with tamoxifen alone 14%   Diagnosis 07/29/2016 Liver, needle/core biopsy, posterior of right lobe METASTATIC ADENOCARCINOMA, CONSISTENT WITH COLONIC PRIMARY.  RADIOLOGY STUDIES   04/09/2018 CT CAP W Contrast IMPRESSION: 1. Slight interval  increase in size as well as development of new low-attenuation lesions within the liver. 2. Grossly unchanged bilateral pulmonary nodules. 3. Similar-appearing retroperitoneal adenopathy. 4. Small amount of gas within the endometrium of the uterus, nonspecific in etiology. Recommend clinical correlation for the possibility of infectious symptoms. Consider further evaluation with pelvic ultrasound as indicated.  12/16/2017 Diagnostic Mammogram IMPRESSION: No mammographic evidence of malignancy in either breast, status post bilateral mastectomies.  12/03/2017 CT CAP W Contrast IMPRESSION: Chest Impression:  1. Interval increase in size of bilateral small pulmonary nodules. 2. No  mediastinal adenopathy  Abdomen / Pelvis Impression:  1. Dominant lesion in the RIGHT hepatic lobe is similar; however there are 2 adjacent low-density lesions in the RIGHT hepatic lobe which are not appreciated on comparison exam and concerning recurrent hepatic metastasis. 2. Interval increase in size periaortic retroperitoneal lymph nodes consistent with recurrent metastatic adenopathy   PET 07/08/2016 IMPRESSION: 1. Large mass in the right lobe of the liver demonstrates diffuse hypermetabolism, compatible with recurrent metastatic disease in the liver. 2. Multiple small pulmonary nodules are very similar to the recent chest CT 06/17/2016. These do not demonstrate hypermetabolism on the PET images, however, these lesions are well below the level of PET imaging. Given that these nodules are new compared to prior chest CT 12/18/2015, they are concerning for potential metastatic disease to the lungs, and continued attention on followup studies is recommended. 3. No other findings of metastatic disease elsewhere in the neck, chest, abdomen or pelvis. 4. There is some low-level hypermetabolic activity adjacent to the surgical clips in the lateral aspect of the right breast at the site of prior lumpectomy. This area has undergone interval involution over the several prior examinations, and this low-level activity is favored to be related to normal healing. Attention on follow-up imaging is recommended to ensure the stability or resolution of this finding. 5. Aortic atherosclerosis, in addition to 2 vessel coronary artery disease. Please note that although the presence of coronary artery calcium documents the presence of coronary artery disease, the severity of this disease and any potential stenosis cannot be assessed on this non-gated CT examination. Assessment for potential risk factor modification, dietary therapy or pharmacologic therapy may be warranted, if clinically  indicated. 6. Additional incidental findings, as above.  MM DIAG BREAST TOMO BILATERAL 10/17/2016 FINDINGS: There are bilateral lumpectomy changes in the upper central left breast and upper central right breast. No mass, nonsurgical distortion, or suspicious microcalcification is identified in either breast to suggest malignancy. Mammographic images were processed with CAD. IMPRESSION: No evidence of malignancy in either breast. Lumpectomy changes bilaterally. RECOMMENDATION: Diagnostic mammogram is suggested in 1 year. (Code:DM-B-01Y)  CT CHEST, ABDOMEN PELVIS W CONTRAST 10/24/16 IMPRESSION: 1. The numerous tiny bilateral pulmonary nodules seen on the previous imaging exams are no longer evident, suggesting response of therapy. 2. Large ill-defined irregular right hepatic lesion measures smaller on today's study. 3. Slight increase in size of hepatoduodenal ligament lymphadenopathy. 4. Abdominal Aortic Atherosclerois (ICD10-170.0)  CT CAP 01/05/17 IMPRESSION: 1. Left lower lobe pulmonary nodule and hepatic metastatic disease are stable. 2. Hepatoduodenal ligament lymph node is stable. 3. Aortic atherosclerosis (ICD10-170.0). Coronary artery calcification. 4. Enlarged pulmonary arteries, indicative of pulmonary arterial hypertension. 5. Small bilateral adrenal nodules are unchanged.  CT CAP 05/09/17 IMPRESSION: 1. Stable appearance left lower lobe pulmonary nodule with an apparent new tiny posterior right upper lobe pulmonary nodule. Continued attention on follow-up recommended. 2. Dominant ill-defined right hepatic mass measures smaller on today's study. 3. Slight decrease  in the hepatoduodenal ligament lymph node. 4.  Aortic Atherosclerois (ICD10-170.0)   CT CAP W Contrast 08/11/17 IMPRESSION: 1. Essentially stable appearance of small pulmonary nodules, hypodense mass laterally in the right hepatic lobe, and mild portacaval adenopathy. No new or progressive lesions. 2. Other  imaging findings of potential clinical significance: Aortic Atherosclerosis (ICD10-I70.0). Lower lumbar impingement due to spondylosis and degenerative disc disease. Uterine fibroids. Bilateral renal cysts. Coronary atherosclerosis. Mild mitral valve calcification. Mild cardiomegaly.   CT CAP W Contrast 12/02/17 IMPRESSION: Chest Impression: 1. Interval increase in size of bilateral small pulmonary nodules. 2. No mediastinal adenopathy  Abdomen / Pelvis Impression: 1. Dominant lesion in the RIGHT hepatic lobe is similar; however there are 2 adjacent low-density lesions in the RIGHT hepatic lobe which are not appreciated on comparison exam and concerning recurrent hepatic metastasis. 2. Interval increase in size periaortic retroperitoneal lymph nodes consistent with recurrent metastatic adenopathy.  MM DIAG BREAST, 12/16/2017 IMPRESSION: No mammographic evidence of malignancy in either breast, status post bilateral mastectomies.  ASSESSMENT:   Veronica Reyes 62 y.o. female   1. Colon Cancer, Stage I (pT2N0), MMR normal, oligo recurrent disease in liver in 12/2014, KRAS mutation (+), liver and lung recurrence in 06/2016 -I previously reviewed the nature history of metastatic colon cancer, and the potential curative approach with chemotherapy followed by liver lesion ablation or resection. However, more likely, this is not curable disease. -She initially received 3 cycles of Capox, did not want to proceed with fourth cycle due to the moderate side effects from previous chemotherapy treatment. She declined liver mass resection, and underwent liver ablation by interventional radiologist Dr. Earleen Newport in 08/2015. -I previously reviewed her restaging CT scan from 06/17/2016, which unfortunately showed new area of infiltrative disease as the previous ablated liver lesion, likely recurrence. She also developed several new pulmonary nodules, small, concerning for metastatic disease. -We  previously discussed her liver biopsy from 07/29/16: It revealed metastatic adenocarcinoma of the colon. -She started chemotherapy FOLFIRI and Avastin on 08/13/16. Inially she tolerated well but after many cycles she did develop side effects. I previously had to decrease her chemo dose a few times or pt requested a break several times for multiple reasons. Last FOLFIRI chemo was on 07/30/17 (cycle 18). -She did have a good response to chemo, her CEA significantly decreased. CT scan from 05/09/2017 showed continuous response to treatment, her liver metastasis slightly decreased in size.  -After CT CAP from 08/11/17 which showed stable controlled disease, I was able to switch her to maintenance therapy. She started Xeloda with Avastin every 3 weeks on 09/24/17.  -CT CAP W Contrast from 12/02/17 reveals interval increase in size of bilateral small pulmonary nodules and two new lesions in the right hepatic lobe, although her previous liver metastasis in the right lobe has decreased in size.  -Her treatment was subsequently changed back to irinotecan and avastin. Pt declined 5-fu pump.  -She was not very compliant with treatment, due to her trips, her husband's surgery etc. - A 04/09/2018 CT CAP W Contrast showed Slight interval increase in size as well as development of new metastatic lesions within the liver, Grossly unchanged bilateral pulmonary nodules, and Similar-appearing retroperitoneal adenopathy. I reviewed with pt in detail.  The current scan was compared to her previous scan 4 months ago, however she only received 1 cycle of urine taken in the first 2 months after previous scan, some of her disease progression is probably related to lack of consistent treatment. -Due to the disease  progression, I recommend her to add a 5-FU pump infusion back.  However patient still declined.  After lengthy discussion, she is agreeable to add 5-FU bolus with each irinotecan treatment. -Labs reviewed and dicussed with  the patient.  Adequate for treatment, will proceed irinotecan, Avastin, 5-FU bolus and leucovorin today and continue every 2 weeks. -Repeat CT and follow up in September for close f/u    2. Right breast cancer, pT1bN0M0, stage Ia, ER+/PR+/HER2-, History of left Breast Cancer, diagnosed on 02/01/2015 -Left CIS in situ, s/p lumpectomy in 2006 and 2011 by Dr. Margot Chimes, and radiation in 2011 by Dr. Sondra Come.  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 Surgical margins were negative. -I previously reviewed her Oncotype DX result. Her recurrence score is 22, which predicts 14% 10 year risk of distant recurrence with tamoxifen alone. This is intermediate risk, based on her relatively low number, I do not recommend adjuvant chemotherapy. -She has completed adjuvant breast radiation with Dr. Sondra Come in July 2017.  -her [facility] level supported she is postmenopausal -She started adjuvant letrozole in 05/2016, tolerating well, we'll continue. -We previously discussed breast cancer surveillance, I strongly encouraged her to continue annual screening mammogram, self exam, and follow-up with [place] routinely. -diagnostic mammogram on 12/16/2017 showed: No mammographic evidence of malignancy in either breast, status post bilateral mastectomies.   3. History of PE (01/06/2014) -She has a diagnosis of pulmonary embolism, likely secondary to malignancy in the setting of family history for stroke (and possible stroke history in patient as well). Lower extremity dopplers negative.  -continue Xarelto. Due to her metastatic colon cancer, I previously recommended her to continue indefinitely.  4.  Uterine Fibroids  --Negative endometrial biopsy. Following with Gynecology.  5. Kidney lesion (Right) -No hypermetabolic right kidney lesion on the pet scan -Repeat abdominal MRI 05/29/2015 showed unchanged right renal lesion, favored to represent a  complex/septate cyst.  6. Right flank pain, Knee discomfort  -possibly related to her liver metastasis -I previously suggested she see a orthopedist for her knee issues.  -Continue Tylenol No. 4 as needed for pain. Her abdominal pain overall has improved, controlled.    7.  Morbid obesity - I have previously encouraged her to eat healthy and exercise more.  8. HTN  -We previously discussed that Avastin can increase her blood pressure, I recommend her to continue monitoring at home -She was mildly hypertensive on 07/16/17, we will increase her dose of Amlodipine to 69m if needed in the future.  -Continue Amlodipine, lopressor and HCTZ  -BP stable, she is looking for new PCP   9. Hypokalemia  -Given intermittent diarrhea she will continue oral K supplement for now at 1 tab BID.  - continue oral K BID    Plan  Lab and scan reviewed, will continue irinotecan and Avastin every 2 weeks, and add 5-FU bolus and leucovorin, starting today  CT scan with contrast on 9/19  Will see her back in 2 weeks     All questions were answered. The patient knows to call the clinic with any problems, questions or concerns. We can certainly see the patient much sooner if necessary.  I spent 25 minutes counseling the patient face to face. The total time spent in the appointment was 30 minutes.  IDierdre SearlesDweik am acting as scribe for Dr. YTruitt Merle  I have reviewed the above documentation for accuracy and completeness, and I agree with the  above.    [name]  04/13/2018

## 2018-04-06 ENCOUNTER — Other Ambulatory Visit: Payer: BLUE CROSS/BLUE SHIELD

## 2018-04-06 ENCOUNTER — Ambulatory Visit: Payer: BLUE CROSS/BLUE SHIELD | Admitting: Hematology

## 2018-04-06 ENCOUNTER — Ambulatory Visit: Payer: BLUE CROSS/BLUE SHIELD

## 2018-04-07 ENCOUNTER — Telehealth: Payer: Self-pay

## 2018-04-07 ENCOUNTER — Other Ambulatory Visit: Payer: Self-pay

## 2018-04-07 DIAGNOSIS — C787 Secondary malignant neoplasm of liver and intrahepatic bile duct: Secondary | ICD-10-CM

## 2018-04-07 NOTE — Telephone Encounter (Signed)
Reschedule patients CT scan for 7/25 at 1:30 called to inform, she already has oral contrast and reviewed instructions.  Patient verbalized an understanding.

## 2018-04-07 NOTE — Progress Notes (Unsigned)
CT

## 2018-04-08 ENCOUNTER — Ambulatory Visit (HOSPITAL_COMMUNITY)
Admission: RE | Admit: 2018-04-08 | Discharge: 2018-04-08 | Disposition: A | Discharge status: Home / Self Care | Payer: BLUE CROSS/BLUE SHIELD | Source: Ambulatory Visit | Attending: Hematology | Admitting: Hematology

## 2018-04-08 ENCOUNTER — Other Ambulatory Visit: Payer: Self-pay | Admitting: Nurse Practitioner

## 2018-04-08 DIAGNOSIS — C187 Malignant neoplasm of sigmoid colon: Secondary | ICD-10-CM | POA: Diagnosis present

## 2018-04-08 DIAGNOSIS — R918 Other nonspecific abnormal finding of lung field: Secondary | ICD-10-CM | POA: Diagnosis not present

## 2018-04-08 DIAGNOSIS — K769 Liver disease, unspecified: Secondary | ICD-10-CM | POA: Insufficient documentation

## 2018-04-08 DIAGNOSIS — C787 Secondary malignant neoplasm of liver and intrahepatic bile duct: Secondary | ICD-10-CM

## 2018-04-08 DIAGNOSIS — R590 Localized enlarged lymph nodes: Secondary | ICD-10-CM | POA: Insufficient documentation

## 2018-04-08 MED ORDER — IOPAMIDOL (ISOVUE-300) INJECTION 61%
INTRAVENOUS | Status: AC
  Filled 2018-04-08: qty 100

## 2018-04-08 MED ORDER — IOPAMIDOL (ISOVUE-300) INJECTION 61%
100.0000 mL | Freq: Once | INTRAVENOUS | Status: AC | PRN
  Administered 2018-04-08: 100 mL via INTRAVENOUS

## 2018-04-09 ENCOUNTER — Telehealth: Payer: Self-pay

## 2018-04-09 ENCOUNTER — Other Ambulatory Visit: Payer: Self-pay | Admitting: Nurse Practitioner

## 2018-04-09 DIAGNOSIS — C187 Malignant neoplasm of sigmoid colon: Secondary | ICD-10-CM

## 2018-04-09 MED ORDER — ACETAMINOPHEN-CODEINE #4 300-60 MG PO TABS: 1.0000 | ORAL_TABLET | Freq: Three times a day (TID) | ORAL | 0 refills | Status: DC | PRN

## 2018-04-09 NOTE — Telephone Encounter (Signed)
Done. Thanks, Carman Auxier 

## 2018-04-09 NOTE — Telephone Encounter (Signed)
Patient calls stating she needs a refill on her pain medication. She only has 3 left of the Tylenol #4

## 2018-04-12 ENCOUNTER — Other Ambulatory Visit: Payer: Self-pay | Admitting: Nurse Practitioner

## 2018-04-13 ENCOUNTER — Other Ambulatory Visit: Payer: Self-pay | Admitting: Nurse Practitioner

## 2018-04-13 ENCOUNTER — Inpatient Hospital Stay: Payer: BLUE CROSS/BLUE SHIELD

## 2018-04-13 ENCOUNTER — Encounter: Payer: Self-pay | Admitting: Hematology

## 2018-04-13 ENCOUNTER — Inpatient Hospital Stay (HOSPITAL_BASED_OUTPATIENT_CLINIC_OR_DEPARTMENT_OTHER): Payer: BLUE CROSS/BLUE SHIELD | Admitting: Hematology

## 2018-04-13 VITALS — BP 151/86 | HR 73

## 2018-04-13 VITALS — BP 167/91 | HR 92 | Temp 98.4°F | Resp 19 | Ht 66.0 in | Wt 296.2 lb

## 2018-04-13 DIAGNOSIS — E876 Hypokalemia: Secondary | ICD-10-CM

## 2018-04-13 DIAGNOSIS — C187 Malignant neoplasm of sigmoid colon: Secondary | ICD-10-CM

## 2018-04-13 DIAGNOSIS — C7800 Secondary malignant neoplasm of unspecified lung: Secondary | ICD-10-CM

## 2018-04-13 DIAGNOSIS — C787 Secondary malignant neoplasm of liver and intrahepatic bile duct: Secondary | ICD-10-CM | POA: Diagnosis not present

## 2018-04-13 DIAGNOSIS — Z853 Personal history of malignant neoplasm of breast: Secondary | ICD-10-CM

## 2018-04-13 DIAGNOSIS — Z5112 Encounter for antineoplastic immunotherapy: Secondary | ICD-10-CM | POA: Diagnosis not present

## 2018-04-13 DIAGNOSIS — Z95828 Presence of other vascular implants and grafts: Secondary | ICD-10-CM

## 2018-04-13 DIAGNOSIS — I10 Essential (primary) hypertension: Secondary | ICD-10-CM

## 2018-04-13 DIAGNOSIS — C50511 Malignant neoplasm of lower-outer quadrant of right female breast: Secondary | ICD-10-CM

## 2018-04-13 DIAGNOSIS — Z170 Estrogen receptor positive status [ER+]: Secondary | ICD-10-CM

## 2018-04-13 DIAGNOSIS — Z86711 Personal history of pulmonary embolism: Secondary | ICD-10-CM

## 2018-04-13 DIAGNOSIS — Z86718 Personal history of other venous thrombosis and embolism: Secondary | ICD-10-CM

## 2018-04-13 DIAGNOSIS — Z6841 Body Mass Index (BMI) 40.0 and over, adult: Secondary | ICD-10-CM

## 2018-04-13 LAB — CBC WITH DIFFERENTIAL (CANCER CENTER ONLY)
BASOS ABS: 0 10*3/uL (ref 0.0–0.1)
BASOS PCT: 0 %
Eosinophils Absolute: 0 10*3/uL (ref 0.0–0.5)
Eosinophils Relative: 0 %
HEMATOCRIT: 40.4 % (ref 34.8–46.6)
HEMOGLOBIN: 13.1 g/dL (ref 11.6–15.9)
LYMPHS PCT: 24 %
Lymphs Abs: 3.2 10*3/uL (ref 0.9–3.3)
MCH: 30.5 pg (ref 25.1–34.0)
MCHC: 32.4 g/dL (ref 31.5–36.0)
MCV: 94.2 fL (ref 79.5–101.0)
MONOS PCT: 7 %
Monocytes Absolute: 1 10*3/uL — ABNORMAL HIGH (ref 0.1–0.9)
NEUTROS ABS: 9.6 10*3/uL — AB (ref 1.5–6.5)
NEUTROS PCT: 69 %
Platelet Count: 302 10*3/uL (ref 145–400)
RBC: 4.29 MIL/uL (ref 3.70–5.45)
RDW: 16.6 % — ABNORMAL HIGH (ref 11.2–14.5)
WBC Count: 13.8 10*3/uL — ABNORMAL HIGH (ref 3.9–10.3)

## 2018-04-13 LAB — CMP (CANCER CENTER ONLY)
ALK PHOS: 62 U/L (ref 38–126)
ALT: 25 U/L (ref 0–44)
ANION GAP: 10 (ref 5–15)
AST: 23 U/L (ref 15–41)
Albumin: 3 g/dL — ABNORMAL LOW (ref 3.5–5.0)
BUN: 17 mg/dL (ref 8–23)
CO2: 25 mmol/L (ref 22–32)
Calcium: 9.5 mg/dL (ref 8.9–10.3)
Chloride: 103 mmol/L (ref 98–111)
Creatinine: 0.79 mg/dL (ref 0.44–1.00)
GFR, Estimated: >60 mL/min (ref >60)
Glucose, Bld: 134 mg/dL — ABNORMAL HIGH (ref 70–99)
POTASSIUM: 3.6 mmol/L (ref 3.5–5.1)
SODIUM: 138 mmol/L (ref 135–145)
TOTAL PROTEIN: 7.6 g/dL (ref 6.5–8.1)
Total Bilirubin: 0.3 mg/dL (ref 0.3–1.2)

## 2018-04-13 LAB — TOTAL PROTEIN, URINE DIPSTICK: Protein, ur: NEGATIVE mg/dL

## 2018-04-13 LAB — CEA (IN HOUSE-CHCC): CEA (CHCC-IN HOUSE): 38.65 ng/mL — AB (ref 0.00–5.00)

## 2018-04-13 MED ORDER — FLUOROURACIL CHEMO INJECTION 2.5 GM/50ML
405.0000 mg/m2 | Freq: Once | INTRAVENOUS | Status: AC
  Administered 2018-04-13: 1000 mg via INTRAVENOUS
  Filled 2018-04-13: qty 20

## 2018-04-13 MED ORDER — PALONOSETRON HCL INJECTION 0.25 MG/5ML
0.2500 mg | Freq: Once | INTRAVENOUS | Status: AC
  Administered 2018-04-13: 0.25 mg via INTRAVENOUS

## 2018-04-13 MED ORDER — SODIUM CHLORIDE 0.9% FLUSH
10.0000 mL | INTRAVENOUS | Status: DC | PRN
  Administered 2018-04-13: 10 mL
  Filled 2018-04-13: qty 10

## 2018-04-13 MED ORDER — PROCHLORPERAZINE MALEATE 10 MG PO TABS
10.0000 mg | ORAL_TABLET | Freq: Once | ORAL | Status: AC | PRN
  Administered 2018-04-13: 10 mg via ORAL

## 2018-04-13 MED ORDER — IRINOTECAN HCL CHEMO INJECTION 100 MG/5ML
130.0000 mg/m2 | Freq: Once | INTRAVENOUS | Status: AC
  Administered 2018-04-13: 320 mg via INTRAVENOUS
  Filled 2018-04-13: qty 15

## 2018-04-13 MED ORDER — HEPARIN SOD (PORK) LOCK FLUSH 100 UNIT/ML IV SOLN
500.0000 [IU] | Freq: Once | INTRAVENOUS | Status: AC | PRN
  Administered 2018-04-13: 500 [IU]
  Filled 2018-04-13: qty 5

## 2018-04-13 MED ORDER — ATROPINE SULFATE 1 MG/ML IJ SOLN
0.5000 mg | Freq: Once | INTRAMUSCULAR | Status: AC | PRN
  Administered 2018-04-13: 0.5 mg via INTRAVENOUS

## 2018-04-13 MED ORDER — SODIUM CHLORIDE 0.9 % IV SOLN
700.0000 mg | Freq: Once | INTRAVENOUS | Status: AC
  Administered 2018-04-13: 700 mg via INTRAVENOUS
  Filled 2018-04-13: qty 16

## 2018-04-13 MED ORDER — SODIUM CHLORIDE 0.9% FLUSH
10.0000 mL | INTRAVENOUS | Status: DC | PRN
  Administered 2018-04-13: 10 mL via INTRAVENOUS
  Filled 2018-04-13: qty 10

## 2018-04-13 MED ORDER — ATROPINE SULFATE 1 MG/ML IJ SOLN
INTRAMUSCULAR | Status: AC
  Filled 2018-04-13: qty 1

## 2018-04-13 MED ORDER — DEXTROSE 5 % IV SOLN
400.0000 mg/m2 | Freq: Once | INTRAVENOUS | Status: AC
  Administered 2018-04-13: 976 mg via INTRAVENOUS
  Filled 2018-04-13: qty 48.8

## 2018-04-13 MED ORDER — SODIUM CHLORIDE 0.9 % IV SOLN
Freq: Once | INTRAVENOUS | Status: AC
  Administered 2018-04-13: 12:00:00 via INTRAVENOUS
  Filled 2018-04-13: qty 5

## 2018-04-13 MED ORDER — SODIUM CHLORIDE 0.9 % IV SOLN
Freq: Once | INTRAVENOUS | Status: AC
  Administered 2018-04-13: 11:00:00 via INTRAVENOUS
  Filled 2018-04-13: qty 250

## 2018-04-13 MED ORDER — PALONOSETRON HCL INJECTION 0.25 MG/5ML
INTRAVENOUS | Status: AC
Start: 2018-04-13 — End: ?
  Filled 2018-04-13: qty 5

## 2018-04-13 MED ORDER — PROCHLORPERAZINE MALEATE 10 MG PO TABS
ORAL_TABLET | ORAL | Status: AC
  Filled 2018-04-13: qty 1

## 2018-04-13 NOTE — Progress Notes (Signed)
Avastin dose increased for recent weight gain per Dr. Burr Medico. Future orders updated as well.   Demetrius Charity, PharmD Pharmacy Phone: [phone] 04/13/2018

## 2018-04-13 NOTE — Patient Instructions (Signed)
[facility] Discharge Instructions for Patients Receiving Chemotherapy  Today you received the following chemotherapy agents:  Avastin, Irinotecan, Leucovorin, Fluorouracil  To help prevent nausea and vomiting after your treatment, we encourage you to take your nausea medication as prescribed.   If you develop nausea and vomiting that is not controlled by your nausea medication, call the clinic.   BELOW ARE SYMPTOMS THAT SHOULD BE REPORTED IMMEDIATELY:  *FEVER GREATER THAN 100.5 F  *CHILLS WITH OR WITHOUT FEVER  NAUSEA AND VOMITING THAT IS NOT CONTROLLED WITH YOUR NAUSEA MEDICATION  *UNUSUAL SHORTNESS OF BREATH  *UNUSUAL BRUISING OR BLEEDING  TENDERNESS IN MOUTH AND THROAT WITH OR WITHOUT PRESENCE OF ULCERS  *URINARY PROBLEMS  *BOWEL PROBLEMS  UNUSUAL RASH Items with * indicate a potential emergency and should be followed up as soon as possible.  Feel free to call the clinic should you have any questions or concerns. The clinic phone number is [phone].  Please show the CHEMO ALERT CARD at check-in to the Emergency Department and triage nurse.   

## 2018-04-14 ENCOUNTER — Other Ambulatory Visit: Payer: Self-pay | Admitting: Hematology

## 2018-04-14 ENCOUNTER — Telehealth: Payer: Self-pay | Admitting: Hematology

## 2018-04-14 DIAGNOSIS — C187 Malignant neoplasm of sigmoid colon: Secondary | ICD-10-CM

## 2018-04-14 NOTE — Telephone Encounter (Signed)
Appointments added to her schedule and she was notified also per 7/30 los

## 2018-04-16 ENCOUNTER — Ambulatory Visit: Payer: BLUE CROSS/BLUE SHIELD | Admitting: Hematology

## 2018-04-16 ENCOUNTER — Other Ambulatory Visit: Payer: BLUE CROSS/BLUE SHIELD

## 2018-04-16 ENCOUNTER — Ambulatory Visit: Payer: BLUE CROSS/BLUE SHIELD

## 2018-04-21 ENCOUNTER — Other Ambulatory Visit: Payer: Self-pay | Admitting: Nurse Practitioner

## 2018-04-21 DIAGNOSIS — C187 Malignant neoplasm of sigmoid colon: Secondary | ICD-10-CM

## 2018-04-23 ENCOUNTER — Other Ambulatory Visit: Payer: Self-pay | Admitting: Nurse Practitioner

## 2018-04-23 ENCOUNTER — Other Ambulatory Visit: Payer: Self-pay | Admitting: Hematology

## 2018-04-23 ENCOUNTER — Other Ambulatory Visit: Payer: BLUE CROSS/BLUE SHIELD

## 2018-04-23 ENCOUNTER — Telehealth: Payer: Self-pay

## 2018-04-23 ENCOUNTER — Ambulatory Visit: Payer: BLUE CROSS/BLUE SHIELD

## 2018-04-23 ENCOUNTER — Ambulatory Visit: Payer: BLUE CROSS/BLUE SHIELD | Admitting: Hematology

## 2018-04-23 DIAGNOSIS — C187 Malignant neoplasm of sigmoid colon: Secondary | ICD-10-CM

## 2018-04-23 MED ORDER — ACETAMINOPHEN-CODEINE #4 300-60 MG PO TABS: 1.0000 | ORAL_TABLET | Freq: Three times a day (TID) | ORAL | 0 refills | Status: DC | PRN

## 2018-04-23 NOTE — Telephone Encounter (Signed)
I refilled, thanks   Amit Leece MD  

## 2018-04-23 NOTE — Telephone Encounter (Signed)
Patient calls for refill on there Tylenol #4 only has 2 pills left.  Send into [facility].

## 2018-04-26 ENCOUNTER — Other Ambulatory Visit: Payer: BLUE CROSS/BLUE SHIELD

## 2018-04-26 NOTE — Progress Notes (Signed)
[place]  [phone] [phone]  Clinic Follow Up Note   DATE OF VISIT: 04/27/2018   [name], MD [address] [place] [address]  DIAGNOSIS: recurrent Cancer of sigmoid colon [facility])  Malignant neoplasm of lower-outer quadrant of right breast of female, estrogen receptor positive ([place])  Personal history of venous thrombosis and embolism  Obesity, Class III, BMI 40-49.9 (morbid obesity) ([place])  Essential hypertension, benign  PROBLEM LIST 1. Left breast DCIS diagnosed in 2006 and 2010, status post lumpectomy. 2. pT2N0M0 stage I colon cancer, s/p partial colectomy on 03/09/2014  3. PE in 12/2013 when she was diagnosed with colon cancer. 4. 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 [facility])   Staging form: Breast, AJCC 7th Edition     Clinical stage from 02/01/2015: Stage IA (T1a, N0, M0) - Signed by [name], MD on 06/04/2015     Pathologic stage from 11/10/2015: Stage IA (T1b, N0, M0) - Signed by [name], MD on 12/08/2015 Cancer of sigmoid colon [facility])   Staging form: Colon and Rectum, AJCC 7th Edition     Clinical: Stage I (T2, N0, M0) - Unsigned     Breast cancer of lower-outer quadrant of right female breast ([place])   2006 Cancer Diagnosis    Left breast DCIS diagnosed in 2006 and 2010, status post lumpectomy.    01/07/2014 Initial Diagnosis    Breast cancer    01/18/2015 Mammogram    38m mass in lower outer quadrant of right breast     02/01/2015 Initial Biopsy    (R) breast mass biopsy showed invasive ductal carcinoma & DCIS, grade 1-2.     02/01/2015 Receptors her2    ER 90%+, PR 10%+, Ki67 10%, HER2 negative (ratio 1.09)     11/07/2015 Surgery    (R) breast lumpectomy and sentinel lymph node biopsy ([name]    11/07/2015 Pathology Results    (R) breast lumpectomy showed invasive ductal carcinoma, grade 2, 1 cm,  low-grade DCIS, negative margins, 3 sentinel lymph nodes were negative. LVI (-). HER2 repeated and remains negative (ratio 1.29).      11/07/2015 Pathologic Stage    pT1b,pN0: Stage IA     11/07/2015 Oncotype testing    RS 22, which predicts a year risk of distant recurrence with tamoxifen alone 14% (intermediate-risk). Adjuvant chemo was not offered    02/21/2016 - 04/10/2016 Radiation Therapy    Adjuvant breast radiation. ([name]). Right breast: 50.4 Gy in 28 fractions.  Right breast boost: 12 Gy in 6 fractions.    05/2016 -  Anti-estrogen oral therapy    Femara (Letrozole) 2.5 mg daily.     10/17/2016 Mammogram    MM DIAG BREAST TOMO BILATERAL 10/17/2016 FINDINGS: There are bilateral lumpectomy changes in the upper central left breast and upper central right breast. No mass, nonsurgical distortion, or suspicious microcalcification is identified in either breast to suggest malignancy. Mammographic images were processed with CAD. IMPRESSION: No evidence of malignancy in either breast. Lumpectomy changes bilaterally. RECOMMENDATION: Diagnostic mammogram is suggested in 1 year. (Code:DM-B-01Y)    12/16/2017 Mammogram    IMPRESSION: No mammographic evidence of malignancy in either breast, status post bilateral mastectomies. RECOMMENDATION: Diagnostic mammogram is suggested in 1 year.      Can[place] of sigmoid colon ([place]   03/04/2014 Initial Diagnosis    Cancer of sigmoid colon    03/09/2014  Pathology Results    G1 invasive adenocarcinoma, no lymph-Vascular invasion or Peri-neural invasion: Absent. MMR normal, MSI stable.     03/09/2014 Surgery    partial colectomy, negative margins.     11/10/2014 Relapse/Recurrence    biopsy confirmed oligo liver recurrence     11/10/2014 Imaging    PET showed two small ares of hypermetabolism in the right lobe of the liver, no other distant metastasis.     11/30/2014 Imaging    abdomen MRI showed a new enhancing lesion which corresponds to an area of  abnormal hyper metabolism on recent PET-CT. Stable enlarged portal caval lymph node.     01/02/2015 Pathology Results    Liver biopsy showed metastatic adenocarcinoma, IHC (+) for CK20, CDX-2, (-) for TTF-1 and ER    01/02/2015 Miscellaneous    liver biopsy KRAS mutation (+)     02/14/2015 Imaging    CT showed:  the small metastatic right hepatic lobe liver lesion is not identified for certain on this examination. No new lesions. Stable small cysts in segment 4 a.     03/05/2015 - 04/16/2015 Chemotherapy    CAPEOX (capecitabine 2500 mg twice daily Day 1-14, oxaliplatin 1 30 mg/m on day 1, every 21 days, S/P 3 cycles     08/24/2015 Procedure    CT-guided oligo liver metastasis microwave ablation by Dr. [name]    06/17/2016 Imaging    CT of the chest/abd/pelvis with contrast 1. Several new small pulmonary nodules worrisome for metastatic disease. 2. New large area of probable infiltrating recurrent tumor in the right hepatic lobe surrounding the prior ablation site. 3. Small supraclavicular lymph nodes.  Attention on future studies. 4. Stable surgical changes involving both breasts. No breast masses are identified. 5. Stable bilateral adrenal gland nodules. 6. Stable upper abdominal and right-sided retroperitoneal lymph nodes.    07/08/2016 PET scan    1. Large mass in the right lobe of the liver demonstrates diffuse hypermetabolism, compatible with recurrent metastatic disease in the liver 2. Multiple small pulmonary nodules are very similar to the recent Chest CT 06/17/16.  3. No other findings of metastatic disease elsewhere in the neck, chest, abdomen, or pelvis. 4. There is some low-level hypermetabolic activity adjacent to the surgical clips in the lateral aspect of the right breast at the site of prior lumpectomy.     07/29/2016 Pathology Results    Liver biopsy confirmed metastatic colon cancer     07/31/2016 - 07/31/2017 Chemotherapy    Chemotherapy FOLFIRI and Avastin (from  cycle 2) and Leucovorin (added from cycle 8) every 2 weeks, started on 07/31/2016, multiple delay per pt's request; chemo break from 5/30-6/21/18; decreased leucovorin to 2000 mg/m2, irinotecan to 112m/m2, 5-fu 20077mm2 from 03/05/17, due to poor tolerance. Started chemo break since 07/31/17.     10/23/2016 Imaging    CT ABDOMEN PELVIS W CONTRAST 10/24/16 IMPRESSION: 1. The numerous tiny bilateral pulmonary nodules seen on the previous imaging exams are no longer evident, suggesting response of therapy. 2. Large ill-defined irregular right hepatic lesion measures smaller on today's study. 3. Slight increase in size of hepatoduodenal ligament lymphadenopathy. 4. Abdominal Aortic Atherosclerois (ICD10-170.0)    01/05/2017 Imaging    CT CAP IMPRESSION: 1. Left lower lobe pulmonary nodule and hepatic metastatic disease are stable. 2. Hepatoduodenal ligament lymph node is stable. 3. Aortic atherosclerosis (ICD10-170.0). Coronary artery calcification. 4. Enlarged pulmonary arteries, indicative of pulmonary arterial hypertension. 5. Small bilateral adrenal nodules are unchanged.    05/09/2017 Imaging  CT CAP IMPRESSION: 1. Stable appearance left lower lobe pulmonary nodule with an apparent new tiny posterior right upper lobe pulmonary nodule. 2. Dominant ill-defined right hepatic mass measures smaller on today's study. 3. Slight decrease in the hepatoduodenal ligament lymph node. 4. Aortic Atherosclerois (ICD10-170.0)    05/09/2017 Imaging    CT CAP 05/09/17 IMPRESSION: 1. Stable appearance left lower lobe pulmonary nodule with an apparent new tiny posterior right upper lobe pulmonary nodule. Continued attention on follow-up recommended. 2. Dominant ill-defined right hepatic mass measures smaller on today's study. 3. Slight decrease in the hepatoduodenal ligament lymph node. 4.  Aortic Atherosclerois (ICD10-170.0)     08/11/2017 Imaging    CT CAP W Contrast 08/11/17 IMPRESSION: 1.  Essentially stable appearance of small pulmonary nodules, hypodense mass laterally in the right hepatic lobe, and mild portacaval adenopathy. No new or progressive lesions. 2. Other imaging findings of potential clinical significance: Aortic Atherosclerosis (ICD10-I70.0). Lower lumbar impingement due to spondylosis and degenerative disc disease. Uterine fibroids. Bilateral renal cysts. Coronary atherosclerosis. Mild mitral valve calcification. Mild cardiomegaly.      09/24/2017 - 12/04/2017 Chemotherapy    Maintenance Xeloda 2500 mg twice daily days 1 through 14 of each cycle of treatment and Avastin given every 3 weeks starting on 09/24/2017. Due to slight progression this was stopped on 12/04/17     12/02/2017 Imaging    CT CAP W Contrast 12/02/17 IMPRESSION: Chest Impression: 1. Interval increase in size of bilateral small pulmonary nodules. 2. No mediastinal adenopathy  Abdomen / Pelvis Impression: 1. Dominant lesion in the RIGHT hepatic lobe is similar; however there are 2 adjacent low-density lesions in the RIGHT hepatic lobe which are not appreciated on comparison exam and concerning recurrent hepatic metastasis. 2. Interval increase in size periaortic retroperitoneal lymph nodes consistent with recurrent metastatic adenopathy.    01/08/2018 -  Chemotherapy    Due to slight disease progression she restarted on Irinotecan plus Avastin every 2 weeks on 01/08/18. Added 5-Fu bolus and Leucovorin on 04/13/18.    04/09/2018 Imaging    04/09/2018 CT CAP W Contrast IMPRESSION: 1. Slight interval increase in size as well as development of new low-attenuation lesions within the liver. 2. Grossly unchanged bilateral pulmonary nodules. 3. Similar-appearing retroperitoneal adenopathy. 4. Small amount of gas within the endometrium of the uterus, nonspecific in etiology. Recommend clinical correlation for the possibility of infectious symptoms. Consider further evaluation with pelvic ultrasound  as indicated.     CURRENT TREATMENT:   1. Letrozole 2.5 mg once daily, started in September 2017 2. Irinotecan plus Avastin every 2 weeks started on 01/08/18, 5-fu bolus and leucovorin every 2 weeks added on 04/13/18    INTERIM HISTORY:   MYASIA SINATRA 62 y.o. female with a history of recurrent left breast cancer, colon cancer s/p laparoscopic-assisted partial colectomy (03/09/2014), PE (01/06/2014), and right breast cancer (01/2015), presents to clinic for follow up and chemo.  She presents to the clinic today by herself. She notes she is doing well but since last cycle with 5FU bolus she developed a sore throat. She is still able to eat and drink, but has no taste. She notes she did not like the magic mouthwash. She notes she tolerated her pump well overall. She stopped eating candy and notes she has lost 2 pounds from that. She notes she takes 2 pain pills a day and her pain is controlled.  She notes intermittent swelling of lower legs, L>R over the past month. She notes she broke her left  knee in 2000. She also has left knee swelling as well. She is still able to walk adequately.      MEDICAL HISTORY: Past Medical History:  Diagnosis Date  . Anxiety   . Breast CA ([place])    radiation and surgery-lt.  . Cancer ([place])    left breast cancer x2  . Hypertension   . Liver lesion   . Personal history of chemotherapy   . Personal history of radiation therapy 2017  . Pulmonary embolism ([place])    "blood clot in lungs" -dx. 4'15 with CT Chest. On Xarelto  . Radiation 01/22/10-03/12/10   left whole breast 4500 cGy, upper aspect boosted to 6120 cGy  . Radiation 02/21/16 - 04/10/16   right breast 50.4 Gy, right breast boost 12 Gy  . Shortness of breath    sob with exertion. dx. with Pulmonary emboli 4'15 ,tx. Xarelto,Lovenox used for [employer].    ALLERGIES:  is allergic to tramadol.  MEDICATIONS:  Allergies as of 04/27/2018      Reactions   Tramadol Other (See Comments)   Dizziness        Medication List        Accurate as of 04/27/18  9:26 AM. Always use your most recent med list.          acetaminophen-codeine 300-60 MG tablet Commonly known as:  TYLENOL #4 Take 1 tablet by mouth every 8 (eight) hours as needed. for pain   amLODipine 5 MG tablet Commonly known as:  NORVASC Take 1 tablet (5 mg total) by mouth daily.   azithromycin 500 MG tablet Commonly known as:  ZITHROMAX Take 1 tablet (500 mg total) by mouth daily.   dexamethasone 4 MG tablet Commonly known as:  DECADRON Take 1 tablet (4 mg total) by mouth daily. For 3-5 days after chemotherapy   ferrous sulfate 325 (65 FE) MG tablet Take 325 mg by mouth daily with breakfast. Pt takes  4 times per week.   hydrochlorothiazide 25 MG tablet Commonly known as:  HYDRODIURIL TAKE 1 TABLET BY MOUTH EVERY IN THE MORNING   letrozole 2.5 MG tablet Commonly known as:  FEMARA TAKE 1 TABLET BY MOUTH EVERY DAY   lidocaine-prilocaine cream Commonly known as:  EMLA Apply 1 application topically as needed.   LORazepam 0.5 MG tablet Commonly known as:  ATIVAN Take 1 tablet (0.5 mg total) by mouth every 8 (eight) hours.   magic mouthwash w/lidocaine Soln Take 5 mLs by mouth 4 (four) times daily as needed for mouth pain.   metoprolol tartrate 50 MG tablet Commonly known as:  LOPRESSOR Take 1 tablet (50 mg total) by mouth 2 (two) times daily.   metoprolol tartrate 50 MG tablet Commonly known as:  LOPRESSOR TAKE 1 TABLET BY MOUTH 2 TIMES DAILY   ondansetron 8 MG tablet Commonly known as:  ZOFRAN Take 1 tablet (8 mg total) by mouth 2 (two) times daily as needed for refractory nausea / vomiting. Start on day 3 after chemotherapy.   potassium chloride SA 20 MEQ tablet Commonly known as:  K-DUR,KLOR-CON TAKE 1 TABLET BY MOUTH 2 TIMES DAILY   prochlorperazine 10 MG tablet Commonly known as:  COMPAZINE Take 1 tablet (10 mg total) by mouth every 6 (six) hours as needed (NAUSEA).   promethazine 25 MG  tablet Commonly known as:  PHENERGAN Take 1 tablet (25 mg total) by mouth every 6 (six) hours as needed for nausea or vomiting.   XARELTO 20 MG Tabs tablet Generic drug:  rivaroxaban TAKE 1 TABLET BY MOUTH EVERY DAY   zolpidem 5 MG tablet Commonly known as:  AMBIEN Take 1 tablet (5 mg total) by mouth at bedtime as needed for sleep.       SURGICAL HISTORY:  Past Surgical History:  Procedure Laterality Date  . BREAST BIOPSY Left 2010   malignant  . BREAST BIOPSY Right 01/2015   malignant  . BREAST LUMPECTOMY Left 2006  . BREAST LUMPECTOMY Left 2010  . BREAST LUMPECTOMY Right 11/07/2015  . BREAST LUMPECTOMY WITH RADIOACTIVE SEED AND SENTINEL LYMPH NODE BIOPSY Right 11/07/2015   Procedure: BREAST LUMPECTOMY WITH RADIOACTIVE SEED AND SENTINEL LYMPH NODE BIOPSY;  Surgeon: Stark Klein, MD;  Location: [facility];  Service: General;  Laterality: Right;  . BREAST SURGERY     '11- Left breast lumpectomy  . CESAREAN SECTION    . CHOLECYSTECTOMY     Laparoscopic 20 yrs ago  . COLON SURGERY  03-09-14   partial colectomy for cancer  . IR GENERIC HISTORICAL  07/29/2016   IR FLUORO GUIDE PORT INSERTION RIGHT 07/29/2016 Sandi Mariscal, MD [facility]  . IR GENERIC HISTORICAL  07/29/2016   IR US GUIDE VASC ACCESS RIGHT 07/29/2016 Sandi Mariscal, MD [facility]  . IR GENERIC HISTORICAL  07/29/2016   IR US GUIDE BX ASP/DRAIN 07/29/2016 [facility]  . PARTIAL COLECTOMY N/A 03/09/2014   Procedure: LAPAROSCOPIC ASSISTED PARTIAL COLECTOMY, splenic flexure mobilization;  Surgeon: Leighton Ruff, MD;  Location: [facility];  Service: General;  Laterality: N/A;  . RADIOFREQUENCY ABLATION Right 08/24/2015   Procedure: MICROWAVE ABLATION RIGHT LIVER LOBE ;  Surgeon: Corrie Mckusick, DO;  Location: [facility];  Service: Anesthesiology;  Laterality: Right;    REVIEW OF SYSTEMS:  Constitutional: Denies fevers, chills or abnormal weight loss   Eyes: Denies blurriness of vision Ears, nose, mouth, throat,  and face: Denies mucositis. (+) sore throat Respiratory: Denies cough, dyspnea or wheezes   Cardiovascular: Denies palpitation, chest discomfort (+) B/l lower extremity swelling, L>R Gastrointestinal:  Denies heartburn or change in bowel habits. Skin: Denies abnormal skin rashes MSK: (+) Left knee swelling  Lymphatics: Denies new lymphadenopathy or easy bruising Neurological:Denies numbness, tingling or new weaknesses   Behavioral/Psych: Mood is stable, no new changes  All other systems were reviewed with the patient and are negative.    PHYSICAL EXAMINATION:   ECOG PERFORMANCE STATUS: 1 Vitals:   04/27/18 0854  BP: (!) 147/87  Pulse: 91  Resp: (!) 26  Temp: 98.1 F (36.7 C)  TempSrc: Oral  SpO2: 100%  Weight: 297 lb 3.2 oz (134.8 kg)  Height: '5\' 6"'  (1.676 m)    GENERAL:alert, no distress and comfortable; well developed and obese. Easily mobile to exam table.  SKIN: skin color, texture, turgor are normal, no rashes or significant lesions EYES: normal, Conjunctiva are pink and non-injected, sclera clear OROPHARYNX:no exudate, no erythema and lips, buccal mucosa, and tongue normal  NECK: supple, thyroid normal size, non-tender, without nodularity LYMPH:  no palpable lymphadenopathy in the cervical, axillary or supraclavicular LUNGS: clear to auscultation with normal breathing effort, no wheezes or rhonchi  HEART: regular rate & rhythm and no murmurs (+) B/l lower extremity edema, L>R ABDOMEN:abdomen soft, non-tender and normal bowel sounds; incision healing with signs of infection.  Mild hepatomegaly with tenderness at the RUQ, no abdominal tenderness. No local megaly Musculoskeletal:no cyanosis of digits and no clubbing, limited ROM of left shoulder (+) Left knee swelling NEURO: alert & oriented x 3 with fluent speech, no  focal motor/sensory deficits  Labs:  CBC Latest Ref Rng & Units 04/27/2018 04/13/2018 03/30/2018  WBC 3.9 - 10.3 K/uL 7.0 13.8(H) 7.4  Hemoglobin 11.6 - 15.9  g/dL 12.9 13.1 13.0  Hematocrit 34.8 - 46.6 % 39.5 40.4 39.0  Platelets 145 - 400 K/uL 232 302 312    CMP Latest Ref Rng & Units 04/13/2018 03/30/2018 03/12/2018  Glucose 70 - 99 mg/dL 134(H) 95 122(H)  BUN 8 - 23 mg/dL '17 10 9  ' Creatinine 0.44 - 1.00 mg/dL 0.79 0.77 0.83  Sodium 135 - 145 mmol/L 138 140 141  Potassium 3.5 - 5.1 mmol/L 3.6 3.7 4.0  Chloride 98 - 111 mmol/L 103 104 105  CO2 22 - 32 mmol/L '25 28 26  ' Calcium 8.9 - 10.3 mg/dL 9.5 9.9 9.4  Total Protein 6.5 - 8.1 g/dL 7.6 7.9 7.5  Total Bilirubin 0.3 - 1.2 mg/dL 0.3 0.5 0.9  Alkaline Phos 38 - 126 U/L 62 73 63  AST 15 - 41 U/L 23 37 43(H)  ALT 0 - 44 U/L 25 29 32    CEA  01/18/2014: 0.8 03/01/2015: 1.3 01/31/2016: 4.8 05/28/2016: 48 07/31/2016: 173.7 08/28/16: 139 09/25/2016: 43 11/04/16: 9.94 12/18/2016: 6.99 01/29/17: 4.58 03/04/17: 6.09 04/09/17: 4.22 05/07/17: 5.29 06/04/17: 4.12 07/02/17: 6.78 07/30/17 : 5.35  09/24/17: 5.41 10/23/17: 8.03 11/13/17: 12.32 01/01/2018: 27.70 02/05/2018: 39.93 03/12/18: 36.22  04/13/2018: 38.65  PATHOLOGY RESULT Diagnosis 01/02/2015 Liver, needle/core biopsy, right - POSITIVE FOR METASTATIC ADENOCARCINOMA. - SEE COMMENT.   Diagnosis 11/07/2015 1. Breast, lumpectomy, Right - INVASIVE DUCTAL CARCINOMA, GRADE 2, SPANNING 1 CM. - DUCTAL CARCINOMA IN SITU, LOW GRADE. - RESECTION MARGINS ARE NEGATIVE FOR INVASIVE CARCINOMA. - DUCTAL CARCINOMA IN SITU COMES TO WITHIN 0.3 CM OF THE POSTERIOR MARGIN. - BIOPSY SITE. - SEE ONCOLOGY TABLE. 2. Lymph node, sentinel, biopsy, right axillary #1 - ONE OF ONE LYMPH NODES NEGATIVE FOR CARCINOMA (0/1). 3. Lymph node, sentinel, biopsy, right axillary #2 - ONE OF ONE LYMPH NODES NEGATIVE FOR CARCINOMA (0/1). 4. Lymph node, sentinel, biopsy, right axillary #3 - ONE OF ONE LYMPH NODES NEGATIVE FOR CARCINOMA (0/1).  ONCOTYPE DX: RS 37, which predicts a year risk of distant recurrence with tamoxifen alone 14%   Diagnosis 07/29/2016 Liver, needle/core  biopsy, posterior of right lobe METASTATIC ADENOCARCINOMA, CONSISTENT WITH COLONIC PRIMARY.  RADIOLOGY STUDIES   04/09/2018 CT CAP W Contrast IMPRESSION: 1. Slight interval increase in size as well as development of new low-attenuation lesions within the liver. 2. Grossly unchanged bilateral pulmonary nodules. 3. Similar-appearing retroperitoneal adenopathy. 4. Small amount of gas within the endometrium of the uterus, nonspecific in etiology. Recommend clinical correlation for the possibility of infectious symptoms. Consider further evaluation with pelvic ultrasound as indicated.  12/16/2017 Diagnostic Mammogram IMPRESSION: No mammographic evidence of malignancy in either breast, status post bilateral mastectomies.  12/03/2017 CT CAP W Contrast IMPRESSION: Chest Impression:  1. Interval increase in size of bilateral small pulmonary nodules. 2. No mediastinal adenopathy  Abdomen / Pelvis Impression:  1. Dominant lesion in the RIGHT hepatic lobe is similar; however there are 2 adjacent low-density lesions in the RIGHT hepatic lobe which are not appreciated on comparison exam and concerning recurrent hepatic metastasis. 2. Interval increase in size periaortic retroperitoneal lymph nodes consistent with recurrent metastatic adenopathy   PET 07/08/2016 IMPRESSION: 1. Large mass in the right lobe of the liver demonstrates diffuse hypermetabolism, compatible with recurrent metastatic disease in the liver. 2. Multiple small pulmonary nodules are very similar to the recent chest CT  06/17/2016. These do not demonstrate hypermetabolism on the PET images, however, these lesions are well below the level of PET imaging. Given that these nodules are new compared to prior chest CT 12/18/2015, they are concerning for potential metastatic disease to the lungs, and continued attention on followup studies is recommended. 3. No other findings of metastatic disease elsewhere in the neck, chest,  abdomen or pelvis. 4. There is some low-level hypermetabolic activity adjacent to the surgical clips in the lateral aspect of the right breast at the site of prior lumpectomy. This area has undergone interval involution over the several prior examinations, and this low-level activity is favored to be related to normal healing. Attention on follow-up imaging is recommended to ensure the stability or resolution of this finding. 5. Aortic atherosclerosis, in addition to 2 vessel coronary artery disease. Please note that although the presence of coronary artery calcium documents the presence of coronary artery disease, the severity of this disease and any potential stenosis cannot be assessed on this non-gated CT examination. Assessment for potential risk factor modification, dietary therapy or pharmacologic therapy may be warranted, if clinically indicated. 6. Additional incidental findings, as above.  MM DIAG BREAST TOMO BILATERAL 10/17/2016 FINDINGS: There are bilateral lumpectomy changes in the upper central left breast and upper central right breast. No mass, nonsurgical distortion, or suspicious microcalcification is identified in either breast to suggest malignancy. Mammographic images were processed with CAD. IMPRESSION: No evidence of malignancy in either breast. Lumpectomy changes bilaterally. RECOMMENDATION: Diagnostic mammogram is suggested in 1 year. (Code:DM-B-01Y)  CT CHEST, ABDOMEN PELVIS W CONTRAST 10/24/16 IMPRESSION: 1. The numerous tiny bilateral pulmonary nodules seen on the previous imaging exams are no longer evident, suggesting response of therapy. 2. Large ill-defined irregular right hepatic lesion measures smaller on today's study. 3. Slight increase in size of hepatoduodenal ligament lymphadenopathy. 4. Abdominal Aortic Atherosclerois (ICD10-170.0)  CT CAP 01/05/17 IMPRESSION: 1. Left lower lobe pulmonary nodule and hepatic metastatic disease are stable. 2.  Hepatoduodenal ligament lymph node is stable. 3. Aortic atherosclerosis (ICD10-170.0). Coronary artery calcification. 4. Enlarged pulmonary arteries, indicative of pulmonary arterial hypertension. 5. Small bilateral adrenal nodules are unchanged.  CT CAP 05/09/17 IMPRESSION: 1. Stable appearance left lower lobe pulmonary nodule with an apparent new tiny posterior right upper lobe pulmonary nodule. Continued attention on follow-up recommended. 2. Dominant ill-defined right hepatic mass measures smaller on today's study. 3. Slight decrease in the hepatoduodenal ligament lymph node. 4.  Aortic Atherosclerois (ICD10-170.0)   CT CAP W Contrast 08/11/17 IMPRESSION: 1. Essentially stable appearance of small pulmonary nodules, hypodense mass laterally in the right hepatic lobe, and mild portacaval adenopathy. No new or progressive lesions. 2. Other imaging findings of potential clinical significance: Aortic Atherosclerosis (ICD10-I70.0). Lower lumbar impingement due to spondylosis and degenerative disc disease. Uterine fibroids. Bilateral renal cysts. Coronary atherosclerosis. Mild mitral valve calcification. Mild cardiomegaly.   CT CAP W Contrast 12/02/17 IMPRESSION: Chest Impression: 1. Interval increase in size of bilateral small pulmonary nodules. 2. No mediastinal adenopathy  Abdomen / Pelvis Impression: 1. Dominant lesion in the RIGHT hepatic lobe is similar; however there are 2 adjacent low-density lesions in the RIGHT hepatic lobe which are not appreciated on comparison exam and concerning recurrent hepatic metastasis. 2. Interval increase in size periaortic retroperitoneal lymph nodes consistent with recurrent metastatic adenopathy.  MM DIAG BREAST, 12/16/2017 IMPRESSION: No mammographic evidence of malignancy in either breast, status post bilateral mastectomies.  ASSESSMENT:   Veronica Reyes 62 y.o. female   1. Colon Cancer, Stage  I (pT2N0), MMR normal,  oligo recurrent disease in liver in 12/2014, KRAS mutation (+), liver and lung recurrence in 06/2016 -I previously reviewed the nature history of metastatic colon cancer, and the potential curative approach with chemotherapy followed by liver lesion ablation or resection. However, more likely, this is not curable disease. -She initially received 3 cycles of Capox, did not want to proceed with fourth cycle due to the moderate side effects from previous chemotherapy treatment. She declined liver mass resection, and underwent liver ablation by interventional radiologist Dr. Earleen Newport in 08/2015. -I previously reviewed her restaging CT scan from 06/17/2016, which unfortunately showed new area of infiltrative disease as the previous ablated liver lesion, likely recurrence. She also developed several new pulmonary nodules, small, concerning for metastatic disease. 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 -She started chemotherapy FOLFIRI and Avastin on 08/13/16. Inially she tolerated well but after many cycles she did develop side effects. I previously had to decrease her chemo dose a few times or pt requested a break several times for multiple reasons. Last FOLFIRI chemo was on 07/30/17 (cycle 18). -She did have a good response to chemo, her CEA significantly decreased. CT scan from 05/09/2017 showed continuous response to treatment, her liver metastasis slightly decreased in size.  -After CT CAP from 08/11/17 which showed stable controlled disease, I was able to switch her to maintenance therapy. She started Xeloda with Avastin every 3 weeks on 09/24/17.  -CT CAP W Contrast from 12/02/17 reveals interval increase in size of bilateral small pulmonary nodules and two new lesions in the right hepatic lobe, although her previous liver metastasis in the right lobe has decreased in size.  -Her treatment was subsequently changed back to irinotecan and  avastin. Pt declined 5-fu pump on 01/08/18 -She was not very compliant with treatment, due to her trips, her husband's surgery etc. - A 04/09/2018 CT CAP W Contrast showed Slight interval increase in size as well as development of new metastatic lesions within the liver, Grossly unchanged bilateral pulmonary nodules, and Similar-appearing retroperitoneal adenopathy. I reviewed with pt in detail. This scan was compared to her scan 4 months previous, however she only received 1 cycle of urine taken in the first 2 months after previous scan, some of her disease progression is probably related to lack of consistent treatment. -Due to the disease progression, I previously recommended her to add a 5-FU pump infusion back. However patient still declined. After lengthy discussion, she is agreeable to add 5-FU bolus and leucovorin with each irinotecan treatment on 04/13/18.  -She has tolerated the addition of 5-FU bolus and leucovorin well but developed a sore throat for 3 days, likely mucositis from a 5-FU. I suggest she use non-alcohol mouth washes or salt and soda  rinse.  -Labs reviewed, CBC WNL, CMP is still pending. Will proceed with treatment irinotecan, Avastin, 5-FU bolus and leucovorin today and continue every 2 weeks. -Repeat CT and follow up in September for close f/u  -F/u with me in 4 weeks    2. Right breast cancer, pT1bN0M0, stage Ia, ER+/PR+/HER2-, History of left Breast Cancer, diagnosed on 02/01/2015 -Left CIS in situ, s/p lumpectomy in 2006 and 2011 by Dr. Margot Chimes, and radiation in 2011 by Dr. Sondra Come.  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 Surgical margins were negative. -I previously reviewed her Oncotype DX result. Her recurrence score is 22,  which predicts 14% 10 year risk of distant recurrence with tamoxifen alone. This is intermediate risk, based on her relatively low number, I do not recommend  adjuvant chemotherapy. -She has completed adjuvant breast radiation with Dr. Sondra Come in July 2017.  -her [facility] level supported she is postmenopausal -She started adjuvant letrozole in 05/2016, tolerating well, we'll continue. -We previously discussed breast cancer surveillance, I strongly encouraged her to continue annual screening mammogram, self exam, and follow-up with [place] routinely. -diagnostic mammogram on 12/16/2017 showed: No mammographic evidence of malignancy in either breast, status post bilateral mastectomies.   3. History of PE (01/06/2014) -She has a diagnosis of pulmonary embolism, likely secondary to malignancy in the setting of family history for stroke (and possible stroke history in patient as well). Lower extremity dopplers negative.  -Continue Xarelto. Due to her metastatic colon cancer, I previously recommended her to continue indefinitely.  4.  Uterine Fibroids  --Negative endometrial biopsy. Following with Gynecology.  5. Kidney lesion (Right) -No hypermetabolic right kidney lesion on the pet scan -Repeat abdominal MRI 05/29/2015 showed unchanged right renal lesion, favored to represent a complex/septate cyst.  6. Right flank pain, Knee discomfort  -possibly related to her liver metastasis -I previously suggested she see a orthopedist for her knee issues.  -Continue Tylenol No. 4 as needed for pain. Her abdominal pain overall has improved, controlled.    7.  Morbid obesity - I have previously encouraged her to eat healthy and exercise more.  8. HTN  -We previously discussed that Avastin can increase her blood pressure, I recommend her to continue monitoring at home -She was mildly hypertensive on 07/16/17, we will increase her dose of Amlodipine to 1m if needed in the future.  -Continue Amlodipine, lopressor and HCTZ  -BP stable  9. Hypokalemia  -Given intermittent diarrhea she will continue oral K supplement for now at 1 tab BID.  -Continue oral K BID  10. B/l  Lower Extremity swelling, Left knee swelling -She has a history of left knee injury and h/o PE. She is on Xarelto currently. Ambulation is adequate.  -She will follow up with her PCP also  -I strongly encouraged her to use compression socks on both legs in the meantime.  -will get repeat doppler if it gets worse     Plan  -Labs reviewed and adequate to proceed with chemo treatment today  -CT scan with contrast on 9/19  -Please move her appointments from 9/10 to 9/11 and add f/u with me    All questions were answered. The patient knows to call the clinic with any problems, questions or concerns. We can certainly see the patient much sooner if necessary.  I spent 20 minutes counseling the patient face to face. The total time spent in the appointment was 25 minutes.  IOneal Deputy am acting as scribe for YTruitt Merle MD.    I have reviewed the above documentation for accuracy and completeness, and I agree with the above.    YTruitt Merle 04/27/2018

## 2018-04-27 ENCOUNTER — Encounter: Payer: Self-pay | Admitting: Hematology

## 2018-04-27 ENCOUNTER — Inpatient Hospital Stay: Payer: BLUE CROSS/BLUE SHIELD | Attending: Hematology

## 2018-04-27 ENCOUNTER — Inpatient Hospital Stay: Payer: BLUE CROSS/BLUE SHIELD

## 2018-04-27 ENCOUNTER — Inpatient Hospital Stay (HOSPITAL_BASED_OUTPATIENT_CLINIC_OR_DEPARTMENT_OTHER): Payer: BLUE CROSS/BLUE SHIELD | Admitting: Hematology

## 2018-04-27 VITALS — BP 122/71 | Resp 20

## 2018-04-27 VITALS — BP 147/87 | HR 91 | Temp 98.1°F | Resp 26 | Ht 66.0 in | Wt 297.2 lb

## 2018-04-27 DIAGNOSIS — E876 Hypokalemia: Secondary | ICD-10-CM

## 2018-04-27 DIAGNOSIS — C787 Secondary malignant neoplasm of liver and intrahepatic bile duct: Secondary | ICD-10-CM | POA: Insufficient documentation

## 2018-04-27 DIAGNOSIS — Z86711 Personal history of pulmonary embolism: Secondary | ICD-10-CM | POA: Diagnosis not present

## 2018-04-27 DIAGNOSIS — C7800 Secondary malignant neoplasm of unspecified lung: Secondary | ICD-10-CM | POA: Insufficient documentation

## 2018-04-27 DIAGNOSIS — Z170 Estrogen receptor positive status [ER+]: Secondary | ICD-10-CM

## 2018-04-27 DIAGNOSIS — M25562 Pain in left knee: Secondary | ICD-10-CM

## 2018-04-27 DIAGNOSIS — C187 Malignant neoplasm of sigmoid colon: Secondary | ICD-10-CM | POA: Insufficient documentation

## 2018-04-27 DIAGNOSIS — M7989 Other specified soft tissue disorders: Secondary | ICD-10-CM

## 2018-04-27 DIAGNOSIS — Z7901 Long term (current) use of anticoagulants: Secondary | ICD-10-CM | POA: Insufficient documentation

## 2018-04-27 DIAGNOSIS — C50511 Malignant neoplasm of lower-outer quadrant of right female breast: Secondary | ICD-10-CM | POA: Insufficient documentation

## 2018-04-27 DIAGNOSIS — Z6841 Body Mass Index (BMI) 40.0 and over, adult: Secondary | ICD-10-CM | POA: Insufficient documentation

## 2018-04-27 DIAGNOSIS — Z5111 Encounter for antineoplastic chemotherapy: Secondary | ICD-10-CM | POA: Diagnosis not present

## 2018-04-27 DIAGNOSIS — Z95828 Presence of other vascular implants and grafts: Secondary | ICD-10-CM

## 2018-04-27 DIAGNOSIS — J029 Acute pharyngitis, unspecified: Secondary | ICD-10-CM | POA: Diagnosis not present

## 2018-04-27 DIAGNOSIS — Z86718 Personal history of other venous thrombosis and embolism: Secondary | ICD-10-CM

## 2018-04-27 DIAGNOSIS — Z5112 Encounter for antineoplastic immunotherapy: Secondary | ICD-10-CM | POA: Diagnosis present

## 2018-04-27 DIAGNOSIS — I10 Essential (primary) hypertension: Secondary | ICD-10-CM | POA: Insufficient documentation

## 2018-04-27 LAB — CMP (CANCER CENTER ONLY)
ALT: 37 U/L (ref 0–44)
ANION GAP: 15 (ref 5–15)
AST: 30 U/L (ref 15–41)
Albumin: 2.9 g/dL — ABNORMAL LOW (ref 3.5–5.0)
Alkaline Phosphatase: 60 U/L (ref 38–126)
BUN: 9 mg/dL (ref 8–23)
CHLORIDE: 103 mmol/L (ref 98–111)
CO2: 22 mmol/L (ref 22–32)
CREATININE: 0.92 mg/dL (ref 0.44–1.00)
Calcium: 9.6 mg/dL (ref 8.9–10.3)
Glucose, Bld: 161 mg/dL — ABNORMAL HIGH (ref 70–99)
Potassium: 3.7 mmol/L (ref 3.5–5.1)
Sodium: 140 mmol/L (ref 135–145)
Total Bilirubin: 0.5 mg/dL (ref 0.3–1.2)
Total Protein: 7.5 g/dL (ref 6.5–8.1)

## 2018-04-27 LAB — CBC WITH DIFFERENTIAL (CANCER CENTER ONLY)
Basophils Absolute: 0 10*3/uL (ref 0.0–0.1)
Basophils Relative: 1 %
EOS ABS: 0.1 10*3/uL (ref 0.0–0.5)
Eosinophils Relative: 2 %
HEMATOCRIT: 39.5 % (ref 34.8–46.6)
HEMOGLOBIN: 12.9 g/dL (ref 11.6–15.9)
LYMPHS ABS: 2.3 10*3/uL (ref 0.9–3.3)
Lymphocytes Relative: 33 %
MCH: 31 pg (ref 25.1–34.0)
MCHC: 32.7 g/dL (ref 31.5–36.0)
MCV: 94.9 fL (ref 79.5–101.0)
Monocytes Absolute: 0.4 10*3/uL (ref 0.1–0.9)
Monocytes Relative: 5 %
NEUTROS ABS: 4.1 10*3/uL (ref 1.5–6.5)
NEUTROS PCT: 59 %
Platelet Count: 232 10*3/uL (ref 145–400)
RBC: 4.17 MIL/uL (ref 3.70–5.45)
RDW: 18.7 % — ABNORMAL HIGH (ref 11.2–14.5)
WBC Count: 7 10*3/uL (ref 3.9–10.3)

## 2018-04-27 MED ORDER — LEUCOVORIN CALCIUM INJECTION 350 MG
400.0000 mg/m2 | Freq: Once | INTRAMUSCULAR | Status: AC
  Administered 2018-04-27: 976 mg via INTRAVENOUS
  Filled 2018-04-27: qty 48.8

## 2018-04-27 MED ORDER — PALONOSETRON HCL INJECTION 0.25 MG/5ML
0.2500 mg | Freq: Once | INTRAVENOUS | Status: AC
  Administered 2018-04-27: 0.25 mg via INTRAVENOUS

## 2018-04-27 MED ORDER — FOSAPREPITANT DIMEGLUMINE INJECTION 150 MG
Freq: Once | INTRAVENOUS | Status: AC
  Administered 2018-04-27: 10:00:00 via INTRAVENOUS
  Filled 2018-04-27: qty 5

## 2018-04-27 MED ORDER — SODIUM CHLORIDE 0.9 % IV SOLN
INTRAVENOUS | Status: DC
  Administered 2018-04-27: 10:00:00 via INTRAVENOUS
  Filled 2018-04-27: qty 250

## 2018-04-27 MED ORDER — SODIUM CHLORIDE 0.9 % IV SOLN
130.0000 mg/m2 | Freq: Once | INTRAVENOUS | Status: AC
  Administered 2018-04-27: 320 mg via INTRAVENOUS
  Filled 2018-04-27: qty 16

## 2018-04-27 MED ORDER — LEUCOVORIN CALCIUM INJECTION 350 MG: 400.0000 mg/m2 | Freq: Once | INTRAVENOUS | Status: DC

## 2018-04-27 MED ORDER — ATROPINE SULFATE 1 MG/ML IJ SOLN
0.5000 mg | Freq: Once | INTRAMUSCULAR | Status: AC | PRN
  Administered 2018-04-27: 0.5 mg via INTRAVENOUS

## 2018-04-27 MED ORDER — ATROPINE SULFATE 1 MG/ML IJ SOLN
INTRAMUSCULAR | Status: AC
Start: 2018-04-27 — End: ?
  Filled 2018-04-27: qty 1

## 2018-04-27 MED ORDER — SODIUM CHLORIDE 0.9% FLUSH
10.0000 mL | INTRAVENOUS | Status: DC | PRN
  Administered 2018-04-27: 10 mL via INTRAVENOUS
  Filled 2018-04-27: qty 10

## 2018-04-27 MED ORDER — SODIUM CHLORIDE 0.9 % IV SOLN
Freq: Once | INTRAVENOUS | Status: AC
  Administered 2018-04-27: 10:00:00 via INTRAVENOUS
  Filled 2018-04-27: qty 250

## 2018-04-27 MED ORDER — HEPARIN SOD (PORK) LOCK FLUSH 100 UNIT/ML IV SOLN
500.0000 [IU] | Freq: Once | INTRAVENOUS | Status: AC | PRN
  Administered 2018-04-27: 500 [IU]
  Filled 2018-04-27: qty 5

## 2018-04-27 MED ORDER — PROCHLORPERAZINE MALEATE 10 MG PO TABS
10.0000 mg | ORAL_TABLET | Freq: Once | ORAL | Status: AC | PRN
  Administered 2018-04-27: 10 mg via ORAL

## 2018-04-27 MED ORDER — SODIUM CHLORIDE 0.9% FLUSH
10.0000 mL | INTRAVENOUS | Status: DC | PRN
  Administered 2018-04-27: 10 mL
  Filled 2018-04-27: qty 10

## 2018-04-27 MED ORDER — SODIUM CHLORIDE 0.9 % IV SOLN
700.0000 mg | Freq: Once | INTRAVENOUS | Status: AC
  Administered 2018-04-27: 700 mg via INTRAVENOUS
  Filled 2018-04-27: qty 16

## 2018-04-27 MED ORDER — PALONOSETRON HCL INJECTION 0.25 MG/5ML
INTRAVENOUS | Status: AC
  Filled 2018-04-27: qty 5

## 2018-04-27 MED ORDER — PROCHLORPERAZINE MALEATE 10 MG PO TABS
ORAL_TABLET | ORAL | Status: AC
Start: 2018-04-27 — End: ?
  Filled 2018-04-27: qty 1

## 2018-04-27 MED ORDER — FLUOROURACIL CHEMO INJECTION 2.5 GM/50ML
405.0000 mg/m2 | Freq: Once | INTRAVENOUS | Status: AC
  Administered 2018-04-27: 1000 mg via INTRAVENOUS
  Filled 2018-04-27: qty 20

## 2018-04-27 NOTE — Patient Instructions (Signed)
[facility] Discharge Instructions for Patients Receiving Chemotherapy  Today you received the following chemotherapy agents avastin/irinotecan/leucovorin/florouracil   To help prevent nausea and vomiting after your treatment, we encourage you to take your nausea medication as directed  If you develop nausea and vomiting that is not controlled by your nausea medication, call the clinic.   BELOW ARE SYMPTOMS THAT SHOULD BE REPORTED IMMEDIATELY:  *FEVER GREATER THAN 100.5 F  *CHILLS WITH OR WITHOUT FEVER  NAUSEA AND VOMITING THAT IS NOT CONTROLLED WITH YOUR NAUSEA MEDICATION  *UNUSUAL SHORTNESS OF BREATH  *UNUSUAL BRUISING OR BLEEDING  TENDERNESS IN MOUTH AND THROAT WITH OR WITHOUT PRESENCE OF ULCERS  *URINARY PROBLEMS  *BOWEL PROBLEMS  UNUSUAL RASH Items with * indicate a potential emergency and should be followed up as soon as possible.  Feel free to call the clinic you have any questions or concerns. The clinic phone number is [phone].

## 2018-04-29 ENCOUNTER — Telehealth: Payer: Self-pay | Admitting: Hematology

## 2018-04-29 NOTE — Telephone Encounter (Signed)
Appts r/s and patients spouse was notified per 8/13 los

## 2018-05-03 ENCOUNTER — Ambulatory Visit: Payer: BLUE CROSS/BLUE SHIELD

## 2018-05-03 ENCOUNTER — Other Ambulatory Visit: Payer: BLUE CROSS/BLUE SHIELD

## 2018-05-03 ENCOUNTER — Ambulatory Visit: Payer: BLUE CROSS/BLUE SHIELD | Admitting: Hematology

## 2018-05-05 ENCOUNTER — Other Ambulatory Visit: Payer: Self-pay | Admitting: Nurse Practitioner

## 2018-05-11 ENCOUNTER — Inpatient Hospital Stay: Payer: BLUE CROSS/BLUE SHIELD

## 2018-05-11 ENCOUNTER — Inpatient Hospital Stay (HOSPITAL_BASED_OUTPATIENT_CLINIC_OR_DEPARTMENT_OTHER): Payer: BLUE CROSS/BLUE SHIELD | Admitting: Nurse Practitioner

## 2018-05-11 ENCOUNTER — Encounter: Payer: Self-pay | Admitting: Nurse Practitioner

## 2018-05-11 VITALS — BP 148/92 | HR 86 | Temp 98.3°F | Resp 18 | Ht 66.0 in | Wt 297.7 lb

## 2018-05-11 DIAGNOSIS — R609 Edema, unspecified: Secondary | ICD-10-CM

## 2018-05-11 DIAGNOSIS — C187 Malignant neoplasm of sigmoid colon: Secondary | ICD-10-CM

## 2018-05-11 DIAGNOSIS — Z86711 Personal history of pulmonary embolism: Secondary | ICD-10-CM

## 2018-05-11 DIAGNOSIS — Z853 Personal history of malignant neoplasm of breast: Secondary | ICD-10-CM

## 2018-05-11 DIAGNOSIS — I10 Essential (primary) hypertension: Secondary | ICD-10-CM

## 2018-05-11 DIAGNOSIS — C50511 Malignant neoplasm of lower-outer quadrant of right female breast: Secondary | ICD-10-CM

## 2018-05-11 DIAGNOSIS — C787 Secondary malignant neoplasm of liver and intrahepatic bile duct: Secondary | ICD-10-CM | POA: Diagnosis not present

## 2018-05-11 DIAGNOSIS — C7800 Secondary malignant neoplasm of unspecified lung: Secondary | ICD-10-CM

## 2018-05-11 DIAGNOSIS — M7989 Other specified soft tissue disorders: Secondary | ICD-10-CM

## 2018-05-11 DIAGNOSIS — Z5111 Encounter for antineoplastic chemotherapy: Secondary | ICD-10-CM | POA: Diagnosis not present

## 2018-05-11 DIAGNOSIS — E876 Hypokalemia: Secondary | ICD-10-CM

## 2018-05-11 LAB — TOTAL PROTEIN, URINE DIPSTICK

## 2018-05-11 LAB — CBC WITH DIFFERENTIAL (CANCER CENTER ONLY)
Basophils Absolute: 0 10*3/uL (ref 0.0–0.1)
Basophils Relative: 0 %
EOS ABS: 0.1 10*3/uL (ref 0.0–0.5)
EOS PCT: 1 %
HCT: 39.5 % (ref 34.8–46.6)
HEMOGLOBIN: 12.7 g/dL (ref 11.6–15.9)
LYMPHS ABS: 2.8 10*3/uL (ref 0.9–3.3)
Lymphocytes Relative: 33 %
MCH: 30.6 pg (ref 25.1–34.0)
MCHC: 32.2 g/dL (ref 31.5–36.0)
MCV: 95.2 fL (ref 79.5–101.0)
MONO ABS: 0.5 10*3/uL (ref 0.1–0.9)
Monocytes Relative: 6 %
Neutro Abs: 5.1 10*3/uL (ref 1.5–6.5)
Neutrophils Relative %: 60 %
Platelet Count: 275 10*3/uL (ref 145–400)
RBC: 4.15 MIL/uL (ref 3.70–5.45)
RDW: 17.4 % — ABNORMAL HIGH (ref 11.2–14.5)
WBC Count: 8.5 10*3/uL (ref 3.9–10.3)

## 2018-05-11 LAB — CMP (CANCER CENTER ONLY)
ALT: 26 U/L (ref 0–44)
ANION GAP: 11 (ref 5–15)
AST: 32 U/L (ref 15–41)
Albumin: 3 g/dL — ABNORMAL LOW (ref 3.5–5.0)
Alkaline Phosphatase: 61 U/L (ref 38–126)
BUN: 7 mg/dL — ABNORMAL LOW (ref 8–23)
CHLORIDE: 104 mmol/L (ref 98–111)
CO2: 27 mmol/L (ref 22–32)
CREATININE: 0.85 mg/dL (ref 0.44–1.00)
Calcium: 9.8 mg/dL (ref 8.9–10.3)
GFR, Estimated: >60 mL/min (ref >60)
Glucose, Bld: 159 mg/dL — ABNORMAL HIGH (ref 70–99)
Potassium: 3.7 mmol/L (ref 3.5–5.1)
SODIUM: 142 mmol/L (ref 135–145)
Total Bilirubin: 0.4 mg/dL (ref 0.3–1.2)
Total Protein: 7.5 g/dL (ref 6.5–8.1)

## 2018-05-11 LAB — CEA (IN HOUSE-CHCC): CEA (CHCC-In House): 37.13 ng/mL — ABNORMAL HIGH (ref 0.00–5.00)

## 2018-05-11 MED ORDER — PROCHLORPERAZINE MALEATE 10 MG PO TABS
ORAL_TABLET | ORAL | Status: AC
  Filled 2018-05-11: qty 1

## 2018-05-11 MED ORDER — SODIUM CHLORIDE 0.9% FLUSH
10.0000 mL | INTRAVENOUS | Status: DC | PRN
  Administered 2018-05-11: 10 mL
  Filled 2018-05-11: qty 10

## 2018-05-11 MED ORDER — SODIUM CHLORIDE 0.9 % IV SOLN
400.0000 mg/m2 | Freq: Once | INTRAVENOUS | Status: AC
  Administered 2018-05-11: 976 mg via INTRAVENOUS
  Filled 2018-05-11: qty 25

## 2018-05-11 MED ORDER — PROCHLORPERAZINE MALEATE 10 MG PO TABS
10.0000 mg | ORAL_TABLET | Freq: Once | ORAL | Status: AC | PRN
  Administered 2018-05-11: 10 mg via ORAL

## 2018-05-11 MED ORDER — PALONOSETRON HCL INJECTION 0.25 MG/5ML
INTRAVENOUS | Status: AC
  Filled 2018-05-11: qty 5

## 2018-05-11 MED ORDER — ATROPINE SULFATE 1 MG/ML IJ SOLN
0.5000 mg | Freq: Once | INTRAMUSCULAR | Status: AC | PRN
  Administered 2018-05-11: 0.5 mg via INTRAVENOUS

## 2018-05-11 MED ORDER — HEPARIN SOD (PORK) LOCK FLUSH 100 UNIT/ML IV SOLN
500.0000 [IU] | Freq: Once | INTRAVENOUS | Status: AC | PRN
  Administered 2018-05-11: 500 [IU]
  Filled 2018-05-11: qty 5

## 2018-05-11 MED ORDER — FLUOROURACIL CHEMO INJECTION 2.5 GM/50ML
405.0000 mg/m2 | Freq: Once | INTRAVENOUS | Status: AC
  Administered 2018-05-11: 1000 mg via INTRAVENOUS
  Filled 2018-05-11: qty 20

## 2018-05-11 MED ORDER — SODIUM CHLORIDE 0.9 % IV SOLN
130.0000 mg/m2 | Freq: Once | INTRAVENOUS | Status: AC
  Administered 2018-05-11: 320 mg via INTRAVENOUS
  Filled 2018-05-11: qty 15

## 2018-05-11 MED ORDER — SODIUM CHLORIDE 0.9 % IV SOLN
700.0000 mg | Freq: Once | INTRAVENOUS | Status: AC
  Administered 2018-05-11: 700 mg via INTRAVENOUS
  Filled 2018-05-11: qty 16

## 2018-05-11 MED ORDER — PALONOSETRON HCL INJECTION 0.25 MG/5ML
0.2500 mg | Freq: Once | INTRAVENOUS | Status: AC
  Administered 2018-05-11: 0.25 mg via INTRAVENOUS

## 2018-05-11 MED ORDER — SODIUM CHLORIDE 0.9 % IV SOLN
Freq: Once | INTRAVENOUS | Status: AC
  Administered 2018-05-11: 14:00:00 via INTRAVENOUS
  Filled 2018-05-11: qty 250

## 2018-05-11 MED ORDER — SODIUM CHLORIDE 0.9 % IV SOLN
Freq: Once | INTRAVENOUS | Status: AC
  Administered 2018-05-11: 14:00:00 via INTRAVENOUS
  Filled 2018-05-11: qty 5

## 2018-05-11 MED ORDER — ATROPINE SULFATE 1 MG/ML IJ SOLN
INTRAMUSCULAR | Status: AC
  Filled 2018-05-11: qty 1

## 2018-05-11 NOTE — Progress Notes (Signed)
[place]  [phone] [phone]  Clinic Follow up Note   Patient Care Team: [name], MD as PCP - General (Internal Medicine) Leighton Ruff, MD as Consulting Physician (General Surgery) Carol Ada, MD as Consulting Physician (Gastroenterology) Concha Norway, MD (Inactive) as Consulting Physician (Internal Medicine) Emily Filbert, MD as Consulting Physician (Obstetrics and Gynecology) Tania Ade, RN as Registered Nurse (Medical Oncology) Stark Klein, MD as Consulting Physician (General Surgery) [name], MD as Consulting Physician (Hematology) 05/11/2018  SUMMARY OF ONCOLOGIC HISTORY: Oncology History   Breast cancer of lower-outer quadrant of right female breast [facility])   Staging form: Breast, AJCC 7th Edition     Clinical stage from 02/01/2015: Stage IA (T1a, N0, M0) - Signed by [name], MD on 06/04/2015     Pathologic stage from 11/10/2015: Stage IA (T1b, N0, M0) - Signed by [name], MD on 12/08/2015 Cancer of sigmoid colon [facility])   Staging form: Colon and Rectum, AJCC 7th Edition     Clinical: Stage I (T2, N0, M0) - Unsigned     Breast cancer of lower-outer quadrant of right female breast ([place])   2006 Cancer Diagnosis    Left breast DCIS diagnosed in 2006 and 2010, status post lumpectomy.    01/07/2014 Initial Diagnosis    Breast cancer    01/18/2015 Mammogram    53m mass in lower outer quadrant of right breast     02/01/2015 Initial Biopsy    (R) breast mass biopsy showed invasive ductal carcinoma & DCIS, grade 1-2.     02/01/2015 Receptors her2    ER 90%+, PR 10%+, Ki67 10%, HER2 negative (ratio 1.09)     11/07/2015 Surgery    (R) breast lumpectomy and sentinel lymph node biopsy ([name]    11/07/2015 Pathology Results    (R) breast lumpectomy showed invasive ductal carcinoma, grade 2, 1 cm, low-grade DCIS, negative margins, 3 sentinel lymph nodes were negative. LVI (-). HER2 repeated and remains negative (ratio  1.29).      11/07/2015 Pathologic Stage    pT1b,pN0: Stage IA     11/07/2015 Oncotype testing    RS 22, which predicts a year risk of distant recurrence with tamoxifen alone 14% (intermediate-risk). Adjuvant chemo was not offered    02/21/2016 - 04/10/2016 Radiation Therapy    Adjuvant breast radiation. ([name]). Right breast: 50.4 Gy in 28 fractions.  Right breast boost: 12 Gy in 6 fractions.    05/2016 -  Anti-estrogen oral therapy    Femara (Letrozole) 2.5 mg daily.     10/17/2016 Mammogram    MM DIAG BREAST TOMO BILATERAL 10/17/2016 FINDINGS: There are bilateral lumpectomy changes in the upper central left breast and upper central right breast. No mass, nonsurgical distortion, or suspicious microcalcification is identified in either breast to suggest malignancy. Mammographic images were processed with CAD. IMPRESSION: No evidence of malignancy in either breast. Lumpectomy changes bilaterally. RECOMMENDATION: Diagnostic mammogram is suggested in 1 year. (Code:[id number])    12/16/2017 Mammogram    IMPRESSION: No mammographic evidence of malignancy in either breast, status post bilateral mastectomies. RECOMMENDATION: Diagnostic mammogram is suggested in 1 year.      Cancer of sigmoid colon ([place]   03/04/2014 Initial Diagnosis    Cancer of sigmoid colon    03/09/2014 Pathology Results    G1 invasive adenocarcinoma, no lymph-Vascular invasion or Peri-neural invasion: Absent. MMR normal, MSI stable.     03/09/2014 Surgery    partial colectomy, negative margins.  11/10/2014 Relapse/Recurrence    biopsy confirmed oligo liver recurrence     11/10/2014 Imaging    PET showed two small ares of hypermetabolism in the right lobe of the liver, no other distant metastasis.     11/30/2014 Imaging    abdomen MRI showed a new enhancing lesion which corresponds to an area of abnormal hyper metabolism on recent PET-CT. Stable enlarged portal caval lymph node.     01/02/2015 Pathology Results     Liver biopsy showed metastatic adenocarcinoma, IHC (+) for CK20, CDX-2, (-) for TTF-1 and ER    01/02/2015 Miscellaneous    liver biopsy KRAS mutation (+)     02/14/2015 Imaging    CT showed:  the small metastatic right hepatic lobe liver lesion is not identified for certain on this examination. No new lesions. Stable small cysts in segment 4 a.     03/05/2015 - 04/16/2015 Chemotherapy    CAPEOX (capecitabine 2500 mg twice daily Day 1-14, oxaliplatin 1 30 mg/m on day 1, every 21 days, S/P 3 cycles     08/24/2015 Procedure    CT-guided oligo liver metastasis microwave ablation by Dr. [name]    06/17/2016 Imaging    CT of the chest/abd/pelvis with contrast 1. Several new small pulmonary nodules worrisome for metastatic disease. 2. New large area of probable infiltrating recurrent tumor in the right hepatic lobe surrounding the prior ablation site. 3. Small supraclavicular lymph nodes.  Attention on future studies. 4. Stable surgical changes involving both breasts. No breast masses are identified. 5. Stable bilateral adrenal gland nodules. 6. Stable upper abdominal and right-sided retroperitoneal lymph nodes.    07/08/2016 PET scan    1. Large mass in the right lobe of the liver demonstrates diffuse hypermetabolism, compatible with recurrent metastatic disease in the liver 2. Multiple small pulmonary nodules are very similar to the recent Chest CT 06/17/16.  3. No other findings of metastatic disease elsewhere in the neck, chest, abdomen, or pelvis. 4. There is some low-level hypermetabolic activity adjacent to the surgical clips in the lateral aspect of the right breast at the site of prior lumpectomy.     07/29/2016 Pathology Results    Liver biopsy confirmed metastatic colon cancer     07/31/2016 - 07/31/2017 Chemotherapy    Chemotherapy FOLFIRI and Avastin (from cycle 2) and Leucovorin (added from cycle 8) every 2 weeks, started on 07/31/2016, multiple delay per pt's request; chemo  break from 5/30-6/21/18; decreased leucovorin to 2000 mg/m2, irinotecan to 125m/m2, 5-fu 20060mm2 from 03/05/17, due to poor tolerance. Started chemo break since 07/31/17.     10/23/2016 Imaging    CT ABDOMEN PELVIS W CONTRAST 10/24/16 IMPRESSION: 1. The numerous tiny bilateral pulmonary nodules seen on the previous imaging exams are no longer evident, suggesting response of therapy. 2. Large ill-defined irregular right hepatic lesion measures smaller on today's study. 3. Slight increase in size of hepatoduodenal ligament lymphadenopathy. 4. Abdominal Aortic Atherosclerois (ICD10-170.0)    01/05/2017 Imaging    CT CAP IMPRESSION: 1. Left lower lobe pulmonary nodule and hepatic metastatic disease are stable. 2. Hepatoduodenal ligament lymph node is stable. 3. Aortic atherosclerosis (ICD10-170.0). Coronary artery calcification. 4. Enlarged pulmonary arteries, indicative of pulmonary arterial hypertension. 5. Small bilateral adrenal nodules are unchanged.    05/09/2017 Imaging    CT CAP IMPRESSION: 1. Stable appearance left lower lobe pulmonary nodule with an apparent new tiny posterior right upper lobe pulmonary nodule. 2. Dominant ill-defined right hepatic mass measures smaller on today's study. 3. Slight  decrease in the hepatoduodenal ligament lymph node. 4. Aortic Atherosclerois (ICD10-170.0)    05/09/2017 Imaging    CT CAP 05/09/17 IMPRESSION: 1. Stable appearance left lower lobe pulmonary nodule with an apparent new tiny posterior right upper lobe pulmonary nodule. Continued attention on follow-up recommended. 2. Dominant ill-defined right hepatic mass measures smaller on today's study. 3. Slight decrease in the hepatoduodenal ligament lymph node. 4.  Aortic Atherosclerois (ICD10-170.0)     08/11/2017 Imaging    CT CAP W Contrast 08/11/17 IMPRESSION: 1. Essentially stable appearance of small pulmonary nodules, hypodense mass laterally in the right hepatic lobe, and  mild portacaval adenopathy. No new or progressive lesions. 2. Other imaging findings of potential clinical significance: Aortic Atherosclerosis (ICD10-I70.0). Lower lumbar impingement due to spondylosis and degenerative disc disease. Uterine fibroids. Bilateral renal cysts. Coronary atherosclerosis. Mild mitral valve calcification. Mild cardiomegaly.      09/24/2017 - 12/04/2017 Chemotherapy    Maintenance Xeloda 2500 mg twice daily days 1 through 14 of each cycle of treatment and Avastin given every 3 weeks starting on 09/24/2017. Due to slight progression this was stopped on 12/04/17     12/02/2017 Imaging    CT CAP W Contrast 12/02/17 IMPRESSION: Chest Impression: 1. Interval increase in size of bilateral small pulmonary nodules. 2. No mediastinal adenopathy  Abdomen / Pelvis Impression: 1. Dominant lesion in the RIGHT hepatic lobe is similar; however there are 2 adjacent low-density lesions in the RIGHT hepatic lobe which are not appreciated on comparison exam and concerning recurrent hepatic metastasis. 2. Interval increase in size periaortic retroperitoneal lymph nodes consistent with recurrent metastatic adenopathy.    01/08/2018 -  Chemotherapy    Due to slight disease progression she restarted on Irinotecan plus Avastin every 2 weeks on 01/08/18. Added 5-Fu bolus and Leucovorin on 04/13/18.    04/09/2018 Imaging    04/09/2018 CT CAP W Contrast IMPRESSION: 1. Slight interval increase in size as well as development of new low-attenuation lesions within the liver. 2. Grossly unchanged bilateral pulmonary nodules. 3. Similar-appearing retroperitoneal adenopathy. 4. Small amount of gas within the endometrium of the uterus, nonspecific in etiology. Recommend clinical correlation for the possibility of infectious symptoms. Consider further evaluation with pelvic ultrasound as indicated.    CURRENT TREATMENT:   1. Letrozole 2.5 mg once daily, started in September 2017 2. Irinotecan  plus Avastin every 2 weeks started on 01/08/18, 5-fu bolus and leucovorin every 2 weeks added on 04/13/18   INTERVAL HISTORY: Ms. Zamudio returns for follow up and next cycle chemotherapy as scheduled. She completed last cycle on 8/13. She was seen in the infusion room. She feels well. Eating despite low appetite. Denies increased fatigue. No fever, chills, cough, chest pain, dyspnea, nausea, vomiting, constipation, or diarrhea. She has worsening left leg edema, wearing compression stocking. Denies calf pain or erythema. Her feet intermittently feel funny" but she cannot characterized. No upper extremity neuropathy.    MEDICAL HISTORY:  Past Medical History:  Diagnosis Date  . Anxiety   . Breast CA ([place])    radiation and surgery-lt.  . Cancer ([place])    left breast cancer x2  . Hypertension   . Liver lesion   . Personal history of chemotherapy   . Personal history of radiation therapy 2017  . Pulmonary embolism ([place])    "blood clot in lungs" -dx. 4'15 with CT Chest. On Xarelto  . Radiation 01/22/10-03/12/10   left whole breast 4500 cGy, upper aspect boosted to 6120 cGy  . Radiation 02/21/16 -  04/10/16   right breast 50.4 Gy, right breast boost 12 Gy  . Shortness of breath    sob with exertion. dx. with Pulmonary emboli 4'15 ,tx. Xarelto,Lovenox used for [employer].    SURGICAL HISTORY: Past Surgical History:  Procedure Laterality Date  . BREAST BIOPSY Left 2010   malignant  . BREAST BIOPSY Right 01/2015   malignant  . BREAST LUMPECTOMY Left 2006  . BREAST LUMPECTOMY Left 2010  . BREAST LUMPECTOMY Right 11/07/2015  . BREAST LUMPECTOMY WITH RADIOACTIVE SEED AND SENTINEL LYMPH NODE BIOPSY Right 11/07/2015   Procedure: BREAST LUMPECTOMY WITH RADIOACTIVE SEED AND SENTINEL LYMPH NODE BIOPSY;  Surgeon: Stark Klein, MD;  Location: [place];  Service: General;  Laterality: Right;  . BREAST SURGERY     '11- Left breast lumpectomy  . CESAREAN SECTION    . CHOLECYSTECTOMY      Laparoscopic 20 yrs ago  . COLON SURGERY  03-09-14   partial colectomy for cancer  . IR GENERIC HISTORICAL  07/29/2016   IR FLUORO GUIDE PORT INSERTION RIGHT 07/29/2016 Sandi Mariscal, MD [facility]  . IR GENERIC HISTORICAL  07/29/2016   IR US GUIDE VASC ACCESS RIGHT 07/29/2016 Sandi Mariscal, MD [facility]  . IR GENERIC HISTORICAL  07/29/2016   IR US GUIDE BX ASP/DRAIN 07/29/2016 [facility]  . PARTIAL COLECTOMY N/A 03/09/2014   Procedure: LAPAROSCOPIC ASSISTED PARTIAL COLECTOMY, splenic flexure mobilization;  Surgeon: Leighton Ruff, MD;  Location: [facility];  Service: General;  Laterality: N/A;  . RADIOFREQUENCY ABLATION Right 08/24/2015   Procedure: MICROWAVE ABLATION RIGHT LIVER LOBE ;  Surgeon: Corrie Mckusick, DO;  Location: [facility];  Service: Anesthesiology;  Laterality: Right;    I have reviewed the social history and family history with the patient and they are unchanged from previous note.  ALLERGIES:  is allergic to tramadol.  MEDICATIONS:  Current Outpatient Medications  Medication Sig Dispense Refill  . acetaminophen-codeine (TYLENOL #4) 300-60 MG tablet Take 1 tablet by mouth every 8 (eight) hours as needed. for pain 50 tablet 0  . amLODipine (NORVASC) 5 MG tablet Take 1 tablet (5 mg total) by mouth daily. 30 tablet 0  . azithromycin (ZITHROMAX) 500 MG tablet Take 1 tablet (500 mg total) by mouth daily. 5 tablet 0  . dexamethasone (DECADRON) 4 MG tablet Take 1 tablet (4 mg total) by mouth daily. For 3-5 days after chemotherapy 10 tablet 1  . ferrous sulfate 325 (65 FE) MG tablet Take 325 mg by mouth daily with breakfast. Pt takes  4 times per week.    . hydrochlorothiazide (HYDRODIURIL) 25 MG tablet TAKE 1 TABLET BY MOUTH EVERY IN THE MORNING 30 tablet 1  . letrozole (FEMARA) 2.5 MG tablet TAKE 1 TABLET BY MOUTH EVERY DAY 30 tablet 3  . lidocaine-prilocaine (EMLA) cream Apply 1 application topically as needed. 30 g 2  . LORazepam (ATIVAN) 0.5 MG tablet Take 1 tablet (0.5 mg  total) by mouth every 8 (eight) hours. 30 tablet 0  . magic mouthwash w/lidocaine SOLN Take 5 mLs by mouth 4 (four) times daily as needed for mouth pain. 240 mL 1  . metoprolol tartrate (LOPRESSOR) 50 MG tablet Take 1 tablet (50 mg total) by mouth 2 (two) times daily. 60 tablet 1  . metoprolol tartrate (LOPRESSOR) 50 MG tablet TAKE 1 TABLET BY MOUTH 2 TIMES DAILY 60 tablet 2  . ondansetron (ZOFRAN) 8 MG tablet Take 1 tablet (8 mg total) by mouth 2 (two) times daily as needed for refractory  nausea / vomiting. Start on day 3 after chemotherapy. 30 tablet 1  . potassium chloride SA (K-DUR,KLOR-CON) 20 MEQ tablet TAKE 1 TABLET BY MOUTH 2 TIMES DAILY 60 tablet 2  . prochlorperazine (COMPAZINE) 10 MG tablet Take 1 tablet (10 mg total) by mouth every 6 (six) hours as needed (NAUSEA). 30 tablet 2  . promethazine (PHENERGAN) 25 MG tablet Take 1 tablet (25 mg total) by mouth every 6 (six) hours as needed for nausea or vomiting. 30 tablet 2  . XARELTO 20 MG TABS tablet TAKE 1 TABLET BY MOUTH EVERY DAY 30 tablet 3  . zolpidem (AMBIEN) 5 MG tablet Take 1 tablet (5 mg total) by mouth at bedtime as needed for sleep. 20 tablet 0   No current facility-administered medications for this visit.    Facility-Administered Medications Ordered in Other Visits  Medication Dose Route Frequency Provider Last Rate Last Dose  . 0.9 %  sodium chloride infusion   Intravenous Once [name], MD      . atropine injection 0.5 mg  0.5 mg Intravenous Once PRN [name], MD      . bevacizumab (AVASTIN) 700 mg in sodium chloride 0.9 % 100 mL chemo infusion  700 mg Intravenous Once [name], MD      . fluorouracil (ADRUCIL) chemo injection 1,000 mg  405 mg/m2 (Treatment Plan Recorded) Intravenous Once [name], MD      . fosaprepitant (EMEND) 150 mg, dexamethasone (DECADRON) 10 mg in sodium chloride 0.9 % 145 mL IVPB   Intravenous Once [name], MD      . heparin lock flush 100 unit/mL  500 Units Intracatheter Once PRN [name], MD       . irinotecan (CAMPTOSAR) 320 mg in sodium chloride 0.9 % 500 mL chemo infusion  130 mg/m2 (Treatment Plan Recorded) Intravenous Once [name], MD      . leucovorin 976 mg in sodium chloride 0.9 % 250 mL infusion  400 mg/m2 (Treatment Plan Recorded) Intravenous Once [name], MD      . sodium chloride flush (NS) 0.9 % injection 10 mL  10 mL Intracatheter PRN [name], MD        PHYSICAL EXAMINATION: ECOG PERFORMANCE STATUS: 1 - Symptomatic but completely ambulatory  Vitals:   05/11/18 1151  BP: (!) 148/92  Pulse: 86  Resp: 18  Temp: 98.3 F (36.8 C)  SpO2: 97%   Filed Weights   05/11/18 1151  Weight: 297 lb 11.2 oz (135 kg)    GENERAL:alert, no distress and comfortable SKIN: skin color, texture, turgor are normal, no rashes or significant lesions EYES: sclera clear OROPHARYNX:no thrush or ulcers  LYMPH:  no palpable cervical lymphadenopathy  LUNGS: lungs clear anteriorly, normal breathing effort HEART: regular rate & rhythm, bilateral asymmetric LE edema, L>R  ABDOMEN:abdomen soft, non-tender and normal bowel sounds Musculoskeletal:no cyanosis of digits and no clubbing  NEURO: alert & oriented x 3 with fluent speech, no focal motor deficits PAC without erythema   LABORATORY DATA:  I have reviewed the data as listed CBC Latest Ref Rng & Units 05/11/2018 04/27/2018 04/13/2018  WBC 3.9 - 10.3 K/uL 8.5 7.0 13.8(H)  Hemoglobin 11.6 - 15.9 g/dL 12.7 12.9 13.1  Hematocrit 34.8 - 46.6 % 39.5 39.5 40.4  Platelets 145 - 400 K/uL 275 232 302     CMP Latest Ref Rng & Units 05/11/2018 04/27/2018 04/13/2018  Glucose 70 - 99 mg/dL 159(H) 161(H) 134(H)  BUN 8 - 23 mg/dL 7(L) 9 17  Creatinine 0.44 - 1.00 mg/dL 0.85 0.92 0.79  Sodium 135 - 145 mmol/L 142 140 138  Potassium 3.5 - 5.1 mmol/L 3.7 3.7 3.6  Chloride 98 - 111 mmol/L 104 103 103  CO2 22 - 32 mmol/L '27 22 25  ' Calcium 8.9 - 10.3 mg/dL 9.8 9.6 9.5  Total Protein 6.5 - 8.1 g/dL 7.5 7.5 7.6  Total Bilirubin 0.3 - 1.2 mg/dL  0.4 0.5 0.3  Alkaline Phos 38 - 126 U/L 61 60 62  AST 15 - 41 U/L 32 30 23  ALT 0 - 44 U/L 26 37 25      RADIOGRAPHIC STUDIES: I have personally reviewed the radiological images as listed and agreed with the findings in the report. No results found.   ASSESSMENT & PLAN: Oda Kilts 62 y.o. female   1. Colon Cancer, Stage I (pT2N0), MMR normal, oligo recurrent disease in liver in 12/2014, KRAS mutation (+), liver and lung recurrence in 06/2016 2. Right breast cancer, pT1bN0M0, stage Ia, ER+/PR+/HER2-, History of left Breast Cancer, diagnosed on 02/01/2015 - on letrozole  3. H/o PE (01/06/14) 4. Uterine fibroids 5. Right kidney lesion  6. Right flank pain, knee discomfort  7. Morbid obesity 8. HTN 9. Hypokalemia  10. Bilateral lower extremity swelling, left knee swelling  Ms. Sanders-Miller appears well. She completed 2 cycles since adding 5FU bolus and leucovorin to irinotecan and avastin. She is tolerating treatment well overall. She has persistent left > right LE edema and knee swelling. She is on xarelto for previous PE. Will evaluation with doppler study. Labs reviewed, CBC and CMP stable, adequate to proceed with treatment. She continues oral iron once daily, and oral K BID. Will check iron studies periodically. Plan to restage in 05/2018, pre-auth is pending. She will return in 2 weeks for f/u and next cycle.   PLAN -Labs reviewed, proceed with next cycle 5FU bolus, leucovorin, irinotecan, and avastin, continue q2 weeks -Restage CT 05/2018  -Return for f/u in 2 weeks with next cycle  -doppler to evaluate left leg edema   All questions were answered. The patient knows to call the clinic with any problems, questions or concerns. No barriers to learning was detected.    Alla Feeling, NP 05/11/18

## 2018-05-18 ENCOUNTER — Ambulatory Visit (HOSPITAL_COMMUNITY)
Admission: RE | Admit: 2018-05-18 | Discharge: 2018-05-18 | Disposition: A | Discharge status: Home / Self Care | Payer: BLUE CROSS/BLUE SHIELD | Source: Ambulatory Visit | Attending: Nurse Practitioner | Admitting: Nurse Practitioner

## 2018-05-18 DIAGNOSIS — R600 Localized edema: Secondary | ICD-10-CM | POA: Diagnosis present

## 2018-05-18 NOTE — Progress Notes (Signed)
*  Preliminary Results* Left lower extremity venous duplex completed. Left lower extremity is negative for deep vein thrombosis.  There is no evidence of left Baker's cyst.  05/18/2018 11:37 AM  Abram Sander

## 2018-05-21 NOTE — Progress Notes (Signed)
Veronica Reyes  [phone] [phone]  Clinic Follow Up Note   DATE OF VISIT: 05/26/2018   [name], MD [address] [place] [address]  DIAGNOSIS: recurrent Cancer of sigmoid colon ([place]) - Plan: acetaminophen-codeine (TYLENOL #4) 300-60 MG tablet  Malignant neoplasm of lower-outer quadrant of right breast of female, estrogen receptor positive ([place])  Personal history of venous thrombosis and embolism  Obesity, Class III, BMI 40-49.9 (morbid obesity) ([place])  Essential hypertension, benign  PROBLEM LIST 1. Left breast DCIS diagnosed in 2006 and 2010, status post lumpectomy. 2. pT2N0M0 stage I colon cancer, s/p partial colectomy on 03/09/2014  3. PE in 12/2013 when she was diagnosed with colon cancer. 4. 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 [facility])   Staging form: Breast, AJCC 7th Edition     Clinical stage from 02/01/2015: Stage IA (T1a, N0, M0) - Signed by [name], MD on 06/04/2015     Pathologic stage from 11/10/2015: Stage IA (T1b, N0, M0) - Signed by [name], MD on 12/08/2015 Cancer of sigmoid colon [facility])   Staging form: Colon and Rectum, AJCC 7th Edition     Clinical: Stage I (T2, N0, M0) - Unsigned     Breast cancer of lower-outer quadrant of right female breast ([place])   2006 Cancer Diagnosis    Left breast DCIS diagnosed in 2006 and 2010, status post lumpectomy.    01/07/2014 Initial Diagnosis    Breast cancer    01/18/2015 Mammogram    89m mass in lower outer quadrant of right breast     02/01/2015 Initial Biopsy    (R) breast mass biopsy showed invasive ductal carcinoma & DCIS, grade 1-2.     02/01/2015 Receptors her2    ER 90%+, PR 10%+, Ki67 10%, HER2 negative (ratio 1.09)     11/07/2015 Surgery    (R) breast lumpectomy and sentinel lymph node biopsy ([name]    11/07/2015 Pathology Results    (R) breast  lumpectomy showed invasive ductal carcinoma, grade 2, 1 cm, low-grade DCIS, negative margins, 3 sentinel lymph nodes were negative. LVI (-). HER2 repeated and remains negative (ratio 1.29).      11/07/2015 Pathologic Stage    pT1b,pN0: Stage IA     11/07/2015 Oncotype testing    RS 22, which predicts a year risk of distant recurrence with tamoxifen alone 14% (intermediate-risk). Adjuvant chemo was not offered    02/21/2016 - 04/10/2016 Radiation Therapy    Adjuvant breast radiation. ([name]). Right breast: 50.4 Gy in 28 fractions.  Right breast boost: 12 Gy in 6 fractions.    05/2016 -  Anti-estrogen oral therapy    Femara (Letrozole) 2.5 mg daily.     10/17/2016 Mammogram    MM DIAG BREAST TOMO BILATERAL 10/17/2016 FINDINGS: There are bilateral lumpectomy changes in the upper central left breast and upper central right breast. No mass, nonsurgical distortion, or suspicious microcalcification is identified in either breast to suggest malignancy. Mammographic images were processed with CAD. IMPRESSION: No evidence of malignancy in either breast. Lumpectomy changes bilaterally. RECOMMENDATION: Diagnostic mammogram is suggested in 1 year. (Code:DM-B-01Y)    12/16/2017 Mammogram    IMPRESSION: No mammographic evidence of malignancy in either breast, status post bilateral mastectomies. RECOMMENDATION: Diagnostic mammogram is suggested in 1 year.      Cancer of sigmoid colon ([place]   03/04/2014 Initial Diagnosis  Cancer of sigmoid colon    03/09/2014 Pathology Results    G1 invasive adenocarcinoma, no lymph-Vascular invasion or Peri-neural invasion: Absent. MMR normal, MSI stable.     03/09/2014 Surgery    partial colectomy, negative margins.     11/10/2014 Relapse/Recurrence    biopsy confirmed oligo liver recurrence     11/10/2014 Imaging    PET showed two small ares of hypermetabolism in the right lobe of the liver, no other distant metastasis.     11/30/2014 Imaging    abdomen MRI showed  a new enhancing lesion which corresponds to an area of abnormal hyper metabolism on recent PET-CT. Stable enlarged portal caval lymph node.     01/02/2015 Pathology Results    Liver biopsy showed metastatic adenocarcinoma, IHC (+) for CK20, CDX-2, (-) for TTF-1 and ER    01/02/2015 Miscellaneous    liver biopsy KRAS mutation (+)     02/14/2015 Imaging    CT showed:  the small metastatic right hepatic lobe liver lesion is not identified for certain on this examination. No new lesions. Stable small cysts in segment 4 a.     03/05/2015 - 04/16/2015 Chemotherapy    CAPEOX (capecitabine 2500 mg twice daily Day 1-14, oxaliplatin 1 30 mg/m on day 1, every 21 days, S/P 3 cycles     08/24/2015 Procedure    CT-guided oligo liver metastasis microwave ablation by Dr. [name]    06/17/2016 Imaging    CT of the chest/abd/pelvis with contrast 1. Several new small pulmonary nodules worrisome for metastatic disease. 2. New large area of probable infiltrating recurrent tumor in the right hepatic lobe surrounding the prior ablation site. 3. Small supraclavicular lymph nodes.  Attention on future studies. 4. Stable surgical changes involving both breasts. No breast masses are identified. 5. Stable bilateral adrenal gland nodules. 6. Stable upper abdominal and right-sided retroperitoneal lymph nodes.    07/08/2016 PET scan    1. Large mass in the right lobe of the liver demonstrates diffuse hypermetabolism, compatible with recurrent metastatic disease in the liver 2. Multiple small pulmonary nodules are very similar to the recent Chest CT 06/17/16.  3. No other findings of metastatic disease elsewhere in the neck, chest, abdomen, or pelvis. 4. There is some low-level hypermetabolic activity adjacent to the surgical clips in the lateral aspect of the right breast at the site of prior lumpectomy.     07/29/2016 Pathology Results    Liver biopsy confirmed metastatic colon cancer     07/31/2016 - 07/31/2017  Chemotherapy    Chemotherapy FOLFIRI and Avastin (from cycle 2) and Leucovorin (added from cycle 8) every 2 weeks, started on 07/31/2016, multiple delay per pt's request; chemo break from 5/30-6/21/18; decreased leucovorin to 2000 mg/m2, irinotecan to 112m/m2, 5-fu 20026mm2 from 03/05/17, due to poor tolerance. Started chemo break since 07/31/17.     10/23/2016 Imaging    CT ABDOMEN PELVIS W CONTRAST 10/24/16 IMPRESSION: 1. The numerous tiny bilateral pulmonary nodules seen on the previous imaging exams are no longer evident, suggesting response of therapy. 2. Large ill-defined irregular right hepatic lesion measures smaller on today's study. 3. Slight increase in size of hepatoduodenal ligament lymphadenopathy. 4. Abdominal Aortic Atherosclerois (ICD10-170.0)    01/05/2017 Imaging    CT CAP IMPRESSION: 1. Left lower lobe pulmonary nodule and hepatic metastatic disease are stable. 2. Hepatoduodenal ligament lymph node is stable. 3. Aortic atherosclerosis (ICD10-170.0). Coronary artery calcification. 4. Enlarged pulmonary arteries, indicative of pulmonary arterial hypertension. 5. Small bilateral adrenal nodules are  unchanged.    05/09/2017 Imaging    CT CAP IMPRESSION: 1. Stable appearance left lower lobe pulmonary nodule with an apparent new tiny posterior right upper lobe pulmonary nodule. 2. Dominant ill-defined right hepatic mass measures smaller on today's study. 3. Slight decrease in the hepatoduodenal ligament lymph node. 4. Aortic Atherosclerois (ICD10-170.0)    05/09/2017 Imaging    CT CAP 05/09/17 IMPRESSION: 1. Stable appearance left lower lobe pulmonary nodule with an apparent new tiny posterior right upper lobe pulmonary nodule. Continued attention on follow-up recommended. 2. Dominant ill-defined right hepatic mass measures smaller on today's study. 3. Slight decrease in the hepatoduodenal ligament lymph node. 4.  Aortic Atherosclerois (ICD10-170.0)     08/11/2017  Imaging    CT CAP W Contrast 08/11/17 IMPRESSION: 1. Essentially stable appearance of small pulmonary nodules, hypodense mass laterally in the right hepatic lobe, and mild portacaval adenopathy. No new or progressive lesions. 2. Other imaging findings of potential clinical significance: Aortic Atherosclerosis (ICD10-I70.0). Lower lumbar impingement due to spondylosis and degenerative disc disease. Uterine fibroids. Bilateral renal cysts. Coronary atherosclerosis. Mild mitral valve calcification. Mild cardiomegaly.      09/24/2017 - 12/04/2017 Chemotherapy    Maintenance Xeloda 2500 mg twice daily days 1 through 14 of each cycle of treatment and Avastin given every 3 weeks starting on 09/24/2017. Due to slight progression this was stopped on 12/04/17     12/02/2017 Imaging    CT CAP W Contrast 12/02/17 IMPRESSION: Chest Impression: 1. Interval increase in size of bilateral small pulmonary nodules. 2. No mediastinal adenopathy  Abdomen / Pelvis Impression: 1. Dominant lesion in the RIGHT hepatic lobe is similar; however there are 2 adjacent low-density lesions in the RIGHT hepatic lobe which are not appreciated on comparison exam and concerning recurrent hepatic metastasis. 2. Interval increase in size periaortic retroperitoneal lymph nodes consistent with recurrent metastatic adenopathy.    01/08/2018 -  Chemotherapy    Due to slight disease progression she restarted on Irinotecan plus Avastin every 2 weeks on 01/08/18. Added 5-Fu bolus and Leucovorin on 04/13/18.    04/09/2018 Imaging    04/09/2018 CT CAP W Contrast IMPRESSION: 1. Slight interval increase in size as well as development of new low-attenuation lesions within the liver. 2. Grossly unchanged bilateral pulmonary nodules. 3. Similar-appearing retroperitoneal adenopathy. 4. Small amount of gas within the endometrium of the uterus, nonspecific in etiology. Recommend clinical correlation for the possibility of infectious  symptoms. Consider further evaluation with pelvic ultrasound as indicated.     CURRENT TREATMENT:   1. Letrozole 2.5 mg once daily, started in September 2017 2. Irinotecan plus Avastin every 2 weeks started on 01/08/18, 5-fu bolus and leucovorin every 2 weeks added on 04/13/18    INTERIM HISTORY:   Veronica Reyes 63 y.o. female with a history of left breast cancer, colon cancer s/p laparoscopic-assisted partial colectomy (03/09/2014), PE (01/06/2014), and right breast cancer (01/2015), presents to clinic for follow up. She is here at the infusion room and she was noted to have a fever in the infusion room, and repeat temperature was 99.1.  She denies fever or chills at home. She experiences left knee pain associated with edema and warmth. This pain has been there before but has worsened recently. She also has left shoulder pain. She denies fever at home. She states that her taste is different.   MEDICAL HISTORY: Past Medical History:  Diagnosis Date  . Anxiety   . Breast CA ([place])    radiation and surgery-lt.  [facility]land Kitchen  Cancer [facility])    left breast cancer x2  . Hypertension   . Liver lesion   . Personal history of chemotherapy   . Personal history of radiation therapy 2017  . Pulmonary embolism ([place])    "blood clot in lungs" -dx. 4'15 with CT Chest. On Xarelto  . Radiation 01/22/10-03/12/10   left whole breast 4500 cGy, upper aspect boosted to 6120 cGy  . Radiation 02/21/16 - 04/10/16   right breast 50.4 Gy, right breast boost 12 Gy  . Shortness of breath    sob with exertion. dx. with Pulmonary emboli 4'15 ,tx. Xarelto,Lovenox used for [employer].    ALLERGIES:  is allergic to tramadol.  MEDICATIONS:  Allergies as of 05/26/2018      Reactions   Tramadol Other (See Comments)   Dizziness      Medication List        Accurate as of 05/26/18  2:37 PM. Always use your most recent med list.          acetaminophen-codeine 300-60 MG tablet Commonly known as:  TYLENOL #4 Take 1  tablet by mouth every 8 (eight) hours as needed. for pain   amLODipine 5 MG tablet Commonly known as:  NORVASC Take 1 tablet (5 mg total) by mouth daily.   azithromycin 500 MG tablet Commonly known as:  ZITHROMAX Take 1 tablet (500 mg total) by mouth daily.   dexamethasone 4 MG tablet Commonly known as:  DECADRON Take 1 tablet (4 mg total) by mouth daily. For 3-5 days after chemotherapy   ferrous sulfate 325 (65 FE) MG tablet Take 325 mg by mouth daily with breakfast. Pt takes  4 times per week.   hydrochlorothiazide 25 MG tablet Commonly known as:  HYDRODIURIL TAKE 1 TABLET BY MOUTH EVERY IN THE MORNING   letrozole 2.5 MG tablet Commonly known as:  FEMARA TAKE 1 TABLET BY MOUTH EVERY DAY   lidocaine-prilocaine cream Commonly known as:  EMLA Apply 1 application topically as needed.   LORazepam 0.5 MG tablet Commonly known as:  ATIVAN Take 1 tablet (0.5 mg total) by mouth every 8 (eight) hours.   magic mouthwash w/lidocaine Soln Take 5 mLs by mouth 4 (four) times daily as needed for mouth pain.   metoprolol tartrate 50 MG tablet Commonly known as:  LOPRESSOR Take 1 tablet (50 mg total) by mouth 2 (two) times daily.   metoprolol tartrate 50 MG tablet Commonly known as:  LOPRESSOR TAKE 1 TABLET BY MOUTH 2 TIMES DAILY   ondansetron 8 MG tablet Commonly known as:  ZOFRAN Take 1 tablet (8 mg total) by mouth 2 (two) times daily as needed for refractory nausea / vomiting. Start on day 3 after chemotherapy.   potassium chloride SA 20 MEQ tablet Commonly known as:  K-DUR,KLOR-CON TAKE 1 TABLET BY MOUTH 2 TIMES DAILY   prochlorperazine 10 MG tablet Commonly known as:  COMPAZINE Take 1 tablet (10 mg total) by mouth every 6 (six) hours as needed (NAUSEA).   promethazine 25 MG tablet Commonly known as:  PHENERGAN Take 1 tablet (25 mg total) by mouth every 6 (six) hours as needed for nausea or vomiting.   XARELTO 20 MG Tabs tablet Generic drug:  rivaroxaban TAKE 1  TABLET BY MOUTH EVERY DAY   zolpidem 5 MG tablet Commonly known as:  AMBIEN Take 1 tablet (5 mg total) by mouth at bedtime as needed for sleep.       SURGICAL HISTORY:  Past Surgical History:  Procedure Laterality Date  .  BREAST BIOPSY Left 2010   malignant  . BREAST BIOPSY Right 01/2015   malignant  . BREAST LUMPECTOMY Left 2006  . BREAST LUMPECTOMY Left 2010  . BREAST LUMPECTOMY Right 11/07/2015  . BREAST LUMPECTOMY WITH RADIOACTIVE SEED AND SENTINEL LYMPH NODE BIOPSY Right 11/07/2015   Procedure: BREAST LUMPECTOMY WITH RADIOACTIVE SEED AND SENTINEL LYMPH NODE BIOPSY;  Surgeon: Stark Klein, MD;  Location: [facility];  Service: General;  Laterality: Right;  . BREAST SURGERY     '11- Left breast lumpectomy  . CESAREAN SECTION    . CHOLECYSTECTOMY     Laparoscopic 20 yrs ago  . COLON SURGERY  03-09-14   partial colectomy for cancer  . IR GENERIC HISTORICAL  07/29/2016   IR FLUORO GUIDE PORT INSERTION RIGHT 07/29/2016 Sandi Mariscal, MD [facility]  . IR GENERIC HISTORICAL  07/29/2016   IR US GUIDE VASC ACCESS RIGHT 07/29/2016 Sandi Mariscal, MD [facility]  . IR GENERIC HISTORICAL  07/29/2016   IR US GUIDE BX ASP/DRAIN 07/29/2016 [facility]  . PARTIAL COLECTOMY N/A 03/09/2014   Procedure: LAPAROSCOPIC ASSISTED PARTIAL COLECTOMY, splenic flexure mobilization;  Surgeon: Leighton Ruff, MD;  Location: [facility];  Service: General;  Laterality: N/A;  . RADIOFREQUENCY ABLATION Right 08/24/2015   Procedure: MICROWAVE ABLATION RIGHT LIVER LOBE ;  Surgeon: Corrie Mckusick, DO;  Location: [facility];  Service: Anesthesiology;  Laterality: Right;    REVIEW OF SYSTEMS:  Constitutional: Denies fevers, chills or abnormal weight loss  (+) taste changes  Eyes: Denies blurriness of vision Ears, nose, mouth, throat, and face: Denies mucositis. Respiratory: Denies cough, dyspnea or wheezes   Cardiovascular: Denies palpitation, chest discomfort (+) B/l lower extremity swelling,  L>R Gastrointestinal:  Denies heartburn or change in bowel habits. Skin: Denies abnormal skin rashes MSK: (+) Left knee swelling, pain and warmth (+) shoulder pain Lymphatics: Denies new lymphadenopathy or easy bruising Neurological:Denies numbness, tingling or new weaknesses   Behavioral/Psych: Mood is stable, no new changes  All other systems were reviewed with the patient and are negative.    PHYSICAL EXAMINATION:   ECOG PERFORMANCE STATUS: 1 Vitals: BP 147/84 Pulse 97 RR 18 GENERAL:alert, no distress and comfortable; well developed and obese. Easily mobile to exam table.  SKIN: skin color, texture, turgor are normal, no rashes or significant lesions EYES: normal, Conjunctiva are pink and non-injected, sclera clear OROPHARYNX:no exudate, no erythema and lips, buccal mucosa, and tongue normal  NECK: supple, thyroid normal size, non-tender, without nodularity LYMPH:  no palpable lymphadenopathy in the cervical, axillary or supraclavicular LUNGS: clear to auscultation with normal breathing effort, no wheezes or rhonchi  HEART: regular rate & rhythm and no murmurs (+) B/l lower extremity edema, L>R ABDOMEN:abdomen soft, non-tender and normal bowel sounds; incision healing with signs of infection.  Mild hepatomegaly with tenderness at the RUQ, no abdominal tenderness. No local megaly Musculoskeletal:no cyanosis of digits and no clubbing, limited ROM of left shoulder (+) Left knee swelling, warmth and tenderness NEURO: alert & oriented x 3 with fluent speech, no focal motor/sensory deficits  Labs:  CBC Latest Ref Rng & Units 05/26/2018 05/11/2018 04/27/2018  WBC 3.9 - 10.3 K/uL 7.9 8.5 7.0  Hemoglobin 11.6 - 15.9 g/dL 13.0 12.7 12.9  Hematocrit 34.8 - 46.6 % 40.0 39.5 39.5  Platelets 145 - 400 K/uL 214 275 232    CMP Latest Ref Rng & Units 05/26/2018 05/11/2018 04/27/2018  Glucose 70 - 99 mg/dL 140(H) 159(H) 161(H)  BUN 8 - 23 mg/dL 8 7(L) 9  Creatinine 0.44 - 1.00 mg/dL 0.98 0.85 0.92   Sodium 135 - 145 mmol/L 141 142 140  Potassium 3.5 - 5.1 mmol/L 3.6 3.7 3.7  Chloride 98 - 111 mmol/L 104 104 103  CO2 22 - 32 mmol/L '22 27 22  ' Calcium 8.9 - 10.3 mg/dL 9.9 9.8 9.6  Total Protein 6.5 - 8.1 g/dL 7.6 7.5 7.5  Total Bilirubin 0.3 - 1.2 mg/dL 0.5 0.4 0.5  Alkaline Phos 38 - 126 U/L 57 61 60  AST 15 - 41 U/L 27 32 30  ALT 0 - 44 U/L 20 26 37    CEA  01/18/2014: 0.8 03/01/2015: 1.3 01/31/2016: 4.8 05/28/2016: 48 07/31/2016: 173.7 08/28/16: 139 09/25/2016: 43 11/04/16: 9.94 12/18/2016: 6.99 01/29/17: 4.58 03/04/17: 6.09 04/09/17: 4.22 05/07/17: 5.29 06/04/17: 4.12 07/02/17: 6.78 07/30/17 : 5.35  09/24/17: 5.41 10/23/17: 8.03 11/13/17: 12.32 01/01/2018: 27.70 02/05/2018: 39.93 03/12/18: 36.22  04/13/2018: 38.65 05/11/18: 37.13  PATHOLOGY RESULT Diagnosis 01/02/2015 Liver, needle/core biopsy, right - POSITIVE FOR METASTATIC ADENOCARCINOMA. - SEE COMMENT.  Diagnosis 11/07/2015 1. Breast, lumpectomy, Right - INVASIVE DUCTAL CARCINOMA, GRADE 2, SPANNING 1 CM. - DUCTAL CARCINOMA IN SITU, LOW GRADE. - RESECTION MARGINS ARE NEGATIVE FOR INVASIVE CARCINOMA. - DUCTAL CARCINOMA IN SITU COMES TO WITHIN 0.3 CM OF THE POSTERIOR MARGIN. - BIOPSY SITE. - SEE ONCOLOGY TABLE. 2. Lymph node, sentinel, biopsy, right axillary #1 - ONE OF ONE LYMPH NODES NEGATIVE FOR CARCINOMA (0/1). 3. Lymph node, sentinel, biopsy, right axillary #2 - ONE OF ONE LYMPH NODES NEGATIVE FOR CARCINOMA (0/1). 4. Lymph node, sentinel, biopsy, right axillary #3 - ONE OF ONE LYMPH NODES NEGATIVE FOR CARCINOMA (0/1).  ONCOTYPE DX: RS 65, which predicts a year risk of distant recurrence with tamoxifen alone 14%   Diagnosis 07/29/2016 Liver, needle/core biopsy, posterior of right lobe METASTATIC ADENOCARCINOMA, CONSISTENT WITH COLONIC PRIMARY.  RADIOLOGY STUDIES    04/09/2018 CT CAP W Contrast IMPRESSION: 1. Slight interval increase in size as well as development of new low-attenuation lesions within the  liver. 2. Grossly unchanged bilateral pulmonary nodules. 3. Similar-appearing retroperitoneal adenopathy. 4. Small amount of gas within the endometrium of the uterus, nonspecific in etiology. Recommend clinical correlation for the possibility of infectious symptoms. Consider further evaluation with pelvic ultrasound as indicated.  12/16/2017 Diagnostic Mammogram IMPRESSION: No mammographic evidence of malignancy in either breast, status post bilateral mastectomies.  12/03/2017 CT CAP W Contrast IMPRESSION: Chest Impression:  1. Interval increase in size of bilateral small pulmonary nodules. 2. No mediastinal adenopathy  Abdomen / Pelvis Impression:  1. Dominant lesion in the RIGHT hepatic lobe is similar; however there are 2 adjacent low-density lesions in the RIGHT hepatic lobe which are not appreciated on comparison exam and concerning recurrent hepatic metastasis. 2. Interval increase in size periaortic retroperitoneal lymph nodes consistent with recurrent metastatic adenopathy   PET 07/08/2016 IMPRESSION: 1. Large mass in the right lobe of the liver demonstrates diffuse hypermetabolism, compatible with recurrent metastatic disease in the liver. 2. Multiple small pulmonary nodules are very similar to the recent chest CT 06/17/2016. These do not demonstrate hypermetabolism on the PET images, however, these lesions are well below the level of PET imaging. Given that these nodules are new compared to prior chest CT 12/18/2015, they are concerning for potential metastatic disease to the lungs, and continued attention on followup studies is recommended. 3. No other findings of metastatic disease elsewhere in the neck, chest, abdomen or pelvis. 4. There is some low-level hypermetabolic activity adjacent to the surgical clips in the  lateral aspect of the right breast at the site of prior lumpectomy. This area has undergone interval involution over the several prior examinations,  and this low-level activity is favored to be related to normal healing. Attention on follow-up imaging is recommended to ensure the stability or resolution of this finding. 5. Aortic atherosclerosis, in addition to 2 vessel coronary artery disease. Please note that although the presence of coronary artery calcium documents the presence of coronary artery disease, the severity of this disease and any potential stenosis cannot be assessed on this non-gated CT examination. Assessment for potential risk factor modification, dietary therapy or pharmacologic therapy may be warranted, if clinically indicated. 6. Additional incidental findings, as above.  MM DIAG BREAST TOMO BILATERAL 10/17/2016 FINDINGS: There are bilateral lumpectomy changes in the upper central left breast and upper central right breast. No mass, nonsurgical distortion, or suspicious microcalcification is identified in either breast to suggest malignancy. Mammographic images were processed with CAD. IMPRESSION: No evidence of malignancy in either breast. Lumpectomy changes bilaterally. RECOMMENDATION: Diagnostic mammogram is suggested in 1 year. (Code:DM-B-01Y)  CT CHEST, ABDOMEN PELVIS W CONTRAST 10/24/16 IMPRESSION: 1. The numerous tiny bilateral pulmonary nodules seen on the previous imaging exams are no longer evident, suggesting response of therapy. 2. Large ill-defined irregular right hepatic lesion measures smaller on today's study. 3. Slight increase in size of hepatoduodenal ligament lymphadenopathy. 4. Abdominal Aortic Atherosclerois (ICD10-170.0)  CT CAP 01/05/17 IMPRESSION: 1. Left lower lobe pulmonary nodule and hepatic metastatic disease are stable. 2. Hepatoduodenal ligament lymph node is stable. 3. Aortic atherosclerosis (ICD10-170.0). Coronary artery calcification. 4. Enlarged pulmonary arteries, indicative of pulmonary arterial hypertension. 5. Small bilateral adrenal nodules are unchanged.  CT CAP  05/09/17 IMPRESSION: 1. Stable appearance left lower lobe pulmonary nodule with an apparent new tiny posterior right upper lobe pulmonary nodule. Continued attention on follow-up recommended. 2. Dominant ill-defined right hepatic mass measures smaller on today's study. 3. Slight decrease in the hepatoduodenal ligament lymph node. 4.  Aortic Atherosclerois (ICD10-170.0)   CT CAP W Contrast 08/11/17 IMPRESSION: 1. Essentially stable appearance of small pulmonary nodules, hypodense mass laterally in the right hepatic lobe, and mild portacaval adenopathy. No new or progressive lesions. 2. Other imaging findings of potential clinical significance: Aortic Atherosclerosis (ICD10-I70.0). Lower lumbar impingement due to spondylosis and degenerative disc disease. Uterine fibroids. Bilateral renal cysts. Coronary atherosclerosis. Mild mitral valve calcification. Mild cardiomegaly.   CT CAP W Contrast 12/02/17 IMPRESSION: Chest Impression: 1. Interval increase in size of bilateral small pulmonary nodules. 2. No mediastinal adenopathy  Abdomen / Pelvis Impression: 1. Dominant lesion in the RIGHT hepatic lobe is similar; however there are 2 adjacent low-density lesions in the RIGHT hepatic lobe which are not appreciated on comparison exam and concerning recurrent hepatic metastasis. 2. Interval increase in size periaortic retroperitoneal lymph nodes consistent with recurrent metastatic adenopathy.  MM DIAG BREAST, 12/16/2017 IMPRESSION: No mammographic evidence of malignancy in either breast, status post bilateral mastectomies.  ASSESSMENT:   Veronica Reyes 62 y.o. female   1. Colon Cancer, Stage I (pT2N0), MMR normal, oligo recurrent disease in liver in 12/2014, KRAS mutation (+), liver and lung recurrence in 06/2016 -I previously reviewed the nature history of metastatic colon cancer, and the potential curative approach with chemotherapy followed by liver lesion ablation or  resection. However, more likely, this is not curable disease. -She initially received 3 cycles of Capox, did not want to proceed with fourth cycle due to the moderate side effects from previous chemotherapy treatment. She declined  liver mass resection, and underwent liver ablation by interventional radiologist Dr. Earleen Newport in 08/2015. -I previously reviewed her restaging CT scan from 06/17/2016, which unfortunately showed new area of infiltrative disease as the previous ablated liver lesion, likely recurrence. She also developed several new pulmonary nodules, small, concerning for metastatic disease. 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 -She started chemotherapy FOLFIRI and Avastin on 08/13/16. Inially she tolerated well but after many cycles she did develop side effects. I previously had to decrease her chemo dose a few times or pt requested a break several times for multiple reasons. Last FOLFIRI chemo was on 07/30/17 (cycle 18). -She did have a good response to chemo, her CEA significantly decreased. CT scan from 05/09/2017 showed continuous response to treatment, her liver metastasis slightly decreased in size.  -After CT CAP from 08/11/17 which showed stable controlled disease, I was able to switch her to maintenance therapy. She started Xeloda with Avastin every 3 weeks on 09/24/17.  -CT CAP W Contrast from 12/02/17 reveals interval increase in size of bilateral small pulmonary nodules and two new lesions in the right hepatic lobe, although her previous liver metastasis in the right lobe has decreased in size.  -Her treatment was subsequently changed back to irinotecan and avastin. Pt declined 5-fu pump on 01/08/18 -She was not very compliant with treatment, due to her trips, her husband's surgery etc. - A 04/09/2018 CT CAP W Contrast showed Slight interval increase in size as well as development of new metastatic lesions within the  liver, Grossly unchanged bilateral pulmonary nodules, and Similar-appearing retroperitoneal adenopathy. I reviewed with pt in detail. This scan was compared to her scan 4 months previous, however she only received 1 cycle of urine taken in the first 2 months after previous scan, some of her disease progression is probably related to lack of consistent treatment. -Due to the disease progression, I previously recommended her to add a 5-FU pump infusion back. However patient still declined. After lengthy discussion, she is agreeable to add 5-FU bolus and leucovorin with each irinotecan treatment on 04/13/18.  -She has tolerated the addition of 5-FU bolus and leucovorin well but developed a sore throat for 3 days, likely mucositis from a 5-FU. I suggest she use non-alcohol mouth washes or salt and soda  rinse.  -Labs reviewed, CBC WNL with RDW 18.4, CMP showed albumin 3.2 and BG 140. Will proceed with treatment irinotecan, Avastin, 5-FU bolus and leucovorin today and continue every 2 weeks. -Restaging CT scan is scheduled for next week -F/u with me in 2 weeks    2. Right breast cancer, pT1bN0M0, stage Ia, ER+/PR+/HER2-, History of left Breast Cancer, diagnosed on 02/01/2015 -Left CIS in situ, s/p lumpectomy in 2006 and 2011 by Dr. Margot Chimes, and radiation in 2011 by Dr. Sondra Come.  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 Surgical margins were negative. -I previously reviewed her Oncotype DX result. Her recurrence score is 22, which predicts 14% 10 year risk of distant recurrence with tamoxifen alone. This is intermediate risk, based on her relatively low number, I do not recommend adjuvant chemotherapy. -She has completed adjuvant breast radiation with Dr. Sondra Come in July 2017.  -her [facility] level supported she is postmenopausal -She started adjuvant letrozole in 05/2016, tolerating well, we'll continue for a total of 5-7 years   -We previously discussed breast cancer surveillance, I strongly encouraged her to  continue annual screening mammogram, self exam, and follow-up with [place] routinely. -diagnostic mammogram on 12/16/2017 showed: No mammographic evidence of malignancy in either breast, status post bilateral mastectomies.   3. History of PE (01/06/2014) -She has a diagnosis of pulmonary embolism, likely secondary to malignancy in the setting of family history for stroke (and possible stroke history in patient as well). Lower extremity dopplers negative.  -Continue Xarelto. Due to her metastatic colon cancer, I previously recommended her to continue indefinitely.  4.  Uterine Fibroids  --Negative endometrial biopsy. Following with Gynecology.  5. Kidney lesion (Right) -No hypermetabolic right kidney lesion on the pet scan -Repeat abdominal MRI 05/29/2015 showed unchanged right renal lesion, favored to represent a complex/septate cyst.  6. Right flank pain, left Knee discomfort  -possibly related to her liver metastasis -I previously suggested she see a orthopedics for her knee issues.  -Continue Tylenol No. 4 as needed for pain. Her abdominal pain overall has improved, controlled.  -Her left knee swelling has improved, pain is stable    7.  Morbid obesity - I have previously encouraged her to eat healthy and exercise more.  8. HTN  -We previously discussed that Avastin can increase her blood pressure, I recommend her to continue monitoring at home -She was mildly hypertensive on 07/16/17, we will increase her dose of Amlodipine to 9m if needed in the future.  -Continue Amlodipine, lopressor and HCTZ  -BP stable  9. Hypokalemia  -Given intermittent diarrhea she will continue oral K supplement for now at 1 tab BID.  -Continue oral K BID  10. B/l Lower Extremity swelling, Left knee swelling -She has a history of left knee injury and h/o PE. She is on Xarelto currently. Ambulation is adequate.  -She will  follow up with her PCP also  -I strongly encouraged her to use compression socks on both legs in the meantime.  -will get repeat doppler if it gets worse  -She still has left knee swelling with tenderness and warmth. -I advised her to see her orthopedic surgeon and use tylenol as needed.    Plan  -Labs reviewed and adequate to proceed with chemo treatment today  -CT scan with contrast on 9/19 scheduled  -f/u in 2 weeks to review scan and treatment plan    All questions were answered. The patient knows to call the clinic with any problems, questions or concerns. We can certainly see the patient much sooner if necessary.  I spent 20 minutes counseling the patient face to face. The total time spent in the appointment was 25 minutes.  IDierdre SearlesDweik am acting as scribe for Dr. YTruitt Merle  I have reviewed the above documentation for accuracy and completeness, and I agree with the above.    YTruitt Merle 05/26/2018

## 2018-05-25 ENCOUNTER — Other Ambulatory Visit: Payer: BLUE CROSS/BLUE SHIELD

## 2018-05-25 ENCOUNTER — Ambulatory Visit: Payer: BLUE CROSS/BLUE SHIELD

## 2018-05-26 ENCOUNTER — Encounter: Payer: Self-pay | Admitting: Hematology

## 2018-05-26 ENCOUNTER — Inpatient Hospital Stay: Payer: BLUE CROSS/BLUE SHIELD

## 2018-05-26 ENCOUNTER — Inpatient Hospital Stay: Payer: BLUE CROSS/BLUE SHIELD | Attending: Hematology

## 2018-05-26 ENCOUNTER — Inpatient Hospital Stay (HOSPITAL_BASED_OUTPATIENT_CLINIC_OR_DEPARTMENT_OTHER): Payer: BLUE CROSS/BLUE SHIELD | Admitting: Hematology

## 2018-05-26 VITALS — BP 140/67 | HR 97 | Temp 99.1°F | Resp 18

## 2018-05-26 DIAGNOSIS — E876 Hypokalemia: Secondary | ICD-10-CM | POA: Diagnosis not present

## 2018-05-26 DIAGNOSIS — C7800 Secondary malignant neoplasm of unspecified lung: Secondary | ICD-10-CM

## 2018-05-26 DIAGNOSIS — Z5111 Encounter for antineoplastic chemotherapy: Secondary | ICD-10-CM | POA: Insufficient documentation

## 2018-05-26 DIAGNOSIS — C787 Secondary malignant neoplasm of liver and intrahepatic bile duct: Secondary | ICD-10-CM

## 2018-05-26 DIAGNOSIS — M7989 Other specified soft tissue disorders: Secondary | ICD-10-CM | POA: Diagnosis not present

## 2018-05-26 DIAGNOSIS — Z853 Personal history of malignant neoplasm of breast: Secondary | ICD-10-CM | POA: Insufficient documentation

## 2018-05-26 DIAGNOSIS — Z86711 Personal history of pulmonary embolism: Secondary | ICD-10-CM

## 2018-05-26 DIAGNOSIS — I10 Essential (primary) hypertension: Secondary | ICD-10-CM | POA: Insufficient documentation

## 2018-05-26 DIAGNOSIS — C50511 Malignant neoplasm of lower-outer quadrant of right female breast: Secondary | ICD-10-CM

## 2018-05-26 DIAGNOSIS — Z5112 Encounter for antineoplastic immunotherapy: Secondary | ICD-10-CM | POA: Insufficient documentation

## 2018-05-26 DIAGNOSIS — C187 Malignant neoplasm of sigmoid colon: Secondary | ICD-10-CM

## 2018-05-26 DIAGNOSIS — M25562 Pain in left knee: Secondary | ICD-10-CM | POA: Diagnosis not present

## 2018-05-26 DIAGNOSIS — Z95828 Presence of other vascular implants and grafts: Secondary | ICD-10-CM

## 2018-05-26 DIAGNOSIS — M25512 Pain in left shoulder: Secondary | ICD-10-CM | POA: Diagnosis not present

## 2018-05-26 LAB — CMP (CANCER CENTER ONLY)
ALBUMIN: 3.2 g/dL — AB (ref 3.5–5.0)
ALK PHOS: 57 U/L (ref 38–126)
ALT: 20 U/L (ref 0–44)
AST: 27 U/L (ref 15–41)
Anion gap: 15 (ref 5–15)
BUN: 8 mg/dL (ref 8–23)
CALCIUM: 9.9 mg/dL (ref 8.9–10.3)
CHLORIDE: 104 mmol/L (ref 98–111)
CO2: 22 mmol/L (ref 22–32)
CREATININE: 0.98 mg/dL (ref 0.44–1.00)
GFR, Estimated: >60 mL/min (ref >60)
GLUCOSE: 140 mg/dL — AB (ref 70–99)
Potassium: 3.6 mmol/L (ref 3.5–5.1)
SODIUM: 141 mmol/L (ref 135–145)
Total Bilirubin: 0.5 mg/dL (ref 0.3–1.2)
Total Protein: 7.6 g/dL (ref 6.5–8.1)

## 2018-05-26 LAB — CBC WITH DIFFERENTIAL (CANCER CENTER ONLY)
BASOS ABS: 0 10*3/uL (ref 0.0–0.1)
Basophils Relative: 0 %
EOS ABS: 0.1 10*3/uL (ref 0.0–0.5)
Eosinophils Relative: 1 %
HCT: 40 % (ref 34.8–46.6)
Hemoglobin: 13 g/dL (ref 11.6–15.9)
LYMPHS ABS: 2.6 10*3/uL (ref 0.9–3.3)
Lymphocytes Relative: 33 %
MCH: 31.3 pg (ref 25.1–34.0)
MCHC: 32.5 g/dL (ref 31.5–36.0)
MCV: 96.4 fL (ref 79.5–101.0)
Monocytes Absolute: 0.6 10*3/uL (ref 0.1–0.9)
Monocytes Relative: 7 %
Neutro Abs: 4.6 10*3/uL (ref 1.5–6.5)
Neutrophils Relative %: 59 %
PLATELETS: 214 10*3/uL (ref 145–400)
RBC: 4.15 MIL/uL (ref 3.70–5.45)
RDW: 18.4 % — ABNORMAL HIGH (ref 11.2–14.5)
WBC: 7.9 10*3/uL (ref 3.9–10.3)

## 2018-05-26 MED ORDER — ACETAMINOPHEN-CODEINE #4 300-60 MG PO TABS: 1.0000 | ORAL_TABLET | Freq: Three times a day (TID) | ORAL | 0 refills | Status: DC | PRN

## 2018-05-26 MED ORDER — SODIUM CHLORIDE 0.9% FLUSH
10.0000 mL | INTRAVENOUS | Status: DC | PRN
  Administered 2018-05-26: 10 mL
  Filled 2018-05-26: qty 10

## 2018-05-26 MED ORDER — ATROPINE SULFATE 1 MG/ML IJ SOLN
INTRAMUSCULAR | Status: AC
  Filled 2018-05-26: qty 1

## 2018-05-26 MED ORDER — SODIUM CHLORIDE 0.9 % IV SOLN
700.0000 mg | Freq: Once | INTRAVENOUS | Status: AC
  Administered 2018-05-26: 700 mg via INTRAVENOUS
  Filled 2018-05-26: qty 16

## 2018-05-26 MED ORDER — PROCHLORPERAZINE MALEATE 10 MG PO TABS
10.0000 mg | ORAL_TABLET | Freq: Once | ORAL | Status: AC | PRN
  Administered 2018-05-26: 10 mg via ORAL

## 2018-05-26 MED ORDER — ATROPINE SULFATE 1 MG/ML IJ SOLN
0.5000 mg | Freq: Once | INTRAMUSCULAR | Status: AC | PRN
  Administered 2018-05-26: 0.5 mg via INTRAVENOUS

## 2018-05-26 MED ORDER — SODIUM CHLORIDE 0.9 % IV SOLN
Freq: Once | INTRAVENOUS | Status: AC
  Administered 2018-05-26: 13:00:00 via INTRAVENOUS
  Filled 2018-05-26: qty 250

## 2018-05-26 MED ORDER — IRINOTECAN HCL CHEMO INJECTION 100 MG/5ML
130.0000 mg/m2 | Freq: Once | INTRAVENOUS | Status: AC
  Administered 2018-05-26: 320 mg via INTRAVENOUS
  Filled 2018-05-26: qty 1

## 2018-05-26 MED ORDER — LEUCOVORIN CALCIUM INJECTION 350 MG
400.0000 mg/m2 | Freq: Once | INTRAVENOUS | Status: AC
  Administered 2018-05-26: 976 mg via INTRAVENOUS
  Filled 2018-05-26: qty 48.8

## 2018-05-26 MED ORDER — PALONOSETRON HCL INJECTION 0.25 MG/5ML
0.2500 mg | Freq: Once | INTRAVENOUS | Status: AC
  Administered 2018-05-26: 0.25 mg via INTRAVENOUS

## 2018-05-26 MED ORDER — PROCHLORPERAZINE MALEATE 10 MG PO TABS
ORAL_TABLET | ORAL | Status: AC
  Filled 2018-05-26: qty 1

## 2018-05-26 MED ORDER — PALONOSETRON HCL INJECTION 0.25 MG/5ML
INTRAVENOUS | Status: AC
  Filled 2018-05-26: qty 5

## 2018-05-26 MED ORDER — SODIUM CHLORIDE 0.9% FLUSH
10.0000 mL | INTRAVENOUS | Status: DC | PRN
  Filled 2018-05-26: qty 10

## 2018-05-26 MED ORDER — HEPARIN SOD (PORK) LOCK FLUSH 100 UNIT/ML IV SOLN
500.0000 [IU] | Freq: Once | INTRAVENOUS | Status: AC | PRN
  Administered 2018-05-26: 500 [IU]
  Filled 2018-05-26: qty 5

## 2018-05-26 MED ORDER — FLUOROURACIL CHEMO INJECTION 2.5 GM/50ML
405.0000 mg/m2 | Freq: Once | INTRAVENOUS | Status: AC
  Administered 2018-05-26: 1000 mg via INTRAVENOUS
  Filled 2018-05-26: qty 20

## 2018-05-26 MED ORDER — SODIUM CHLORIDE 0.9 % IV SOLN
Freq: Once | INTRAVENOUS | Status: AC
  Administered 2018-05-26: 13:00:00 via INTRAVENOUS
  Filled 2018-05-26: qty 5

## 2018-05-26 MED ORDER — SODIUM CHLORIDE 0.9 % IV SOLN
Freq: Once | INTRAVENOUS | Status: DC
  Filled 2018-05-26: qty 250

## 2018-05-26 NOTE — Patient Instructions (Signed)
[facility] Discharge Instructions for Patients Receiving Chemotherapy  Today you received the following chemotherapy agents Avastin, Irinotecan, Leucovori, Florouracil.  To help prevent nausea and vomiting after your treatment, we encourage you to take your nausea medication as directed.  If you develop nausea and vomiting that is not controlled by your nausea medication, call the clinic.   BELOW ARE SYMPTOMS THAT SHOULD BE REPORTED IMMEDIATELY:  *FEVER GREATER THAN 100.5 F  *CHILLS WITH OR WITHOUT FEVER  NAUSEA AND VOMITING THAT IS NOT CONTROLLED WITH YOUR NAUSEA MEDICATION  *UNUSUAL SHORTNESS OF BREATH  *UNUSUAL BRUISING OR BLEEDING  TENDERNESS IN MOUTH AND THROAT WITH OR WITHOUT PRESENCE OF ULCERS  *URINARY PROBLEMS  *BOWEL PROBLEMS  UNUSUAL RASH Items with * indicate a potential emergency and should be followed up as soon as possible.  Feel free to call the clinic you have any questions or concerns. The clinic phone number is [phone].

## 2018-05-27 ENCOUNTER — Telehealth: Payer: Self-pay | Admitting: Hematology

## 2018-05-27 NOTE — Telephone Encounter (Signed)
Scheduled appt per 9/11 los - pt husband aware of appt date and time

## 2018-05-28 ENCOUNTER — Other Ambulatory Visit: Payer: Self-pay | Admitting: Nurse Practitioner

## 2018-05-28 ENCOUNTER — Other Ambulatory Visit: Payer: Self-pay | Admitting: Hematology

## 2018-05-28 DIAGNOSIS — C50511 Malignant neoplasm of lower-outer quadrant of right female breast: Secondary | ICD-10-CM

## 2018-05-28 DIAGNOSIS — Z170 Estrogen receptor positive status [ER+]: Principal | ICD-10-CM

## 2018-06-03 ENCOUNTER — Ambulatory Visit (HOSPITAL_COMMUNITY): Payer: BLUE CROSS/BLUE SHIELD

## 2018-06-03 NOTE — Progress Notes (Signed)
[place]  [phone] Fax:(336) [phone]  Clinic Follow Up Note   DATE OF VISIT: 06/08/2018   Veronica Docker, MD [address] [place] [address]  DIAGNOSIS: recurrent Cancer of sigmoid colon [facility])  Malignant neoplasm of lower-outer quadrant of right breast of female, estrogen receptor positive ([place])  PROBLEM LIST 1. Left breast DCIS diagnosed in 2006 and 2010, status post lumpectomy. 2. pT2N0M0 stage I colon cancer, s/p partial colectomy on 03/09/2014  3. PE in 12/2013 when she was diagnosed with colon cancer. 4. 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 [facility])   Staging form: Breast, AJCC 7th Edition     Clinical stage from 02/01/2015: Stage IA (T1a, N0, M0) - Signed by [name], MD on 06/04/2015     Pathologic stage from 11/10/2015: Stage IA (T1b, N0, M0) - Signed by [name], MD on 12/08/2015 Cancer of sigmoid colon [facility])   Staging form: Colon and Rectum, AJCC 7th Edition     Clinical: Stage I (T2, N0, M0) - Unsigned     Breast cancer of lower-outer quadrant of right female breast ([place])   2006 Cancer Diagnosis    Left breast DCIS diagnosed in 2006 and 2010, status post lumpectomy.    01/07/2014 Initial Diagnosis    Breast cancer    01/18/2015 Mammogram    11m mass in lower outer quadrant of right breast     02/01/2015 Initial Biopsy    (R) breast mass biopsy showed invasive ductal carcinoma & DCIS, grade 1-2.     02/01/2015 Receptors her2    ER 90%+, PR 10%+, Ki67 10%, HER2 negative (ratio 1.09)     11/07/2015 Surgery    (R) breast lumpectomy and sentinel lymph node biopsy ([name]    11/07/2015 Pathology Results    (R) breast lumpectomy showed invasive ductal carcinoma, grade 2, 1 cm, low-grade DCIS, negative margins, 3 sentinel lymph nodes were negative. LVI (-). HER2 repeated and remains negative (ratio 1.29).      11/07/2015  Pathologic Stage    pT1b,pN0: Stage IA     11/07/2015 Oncotype testing    RS 22, which predicts a year risk of distant recurrence with tamoxifen alone 14% (intermediate-risk). Adjuvant chemo was not offered    02/21/2016 - 04/10/2016 Radiation Therapy    Adjuvant breast radiation. ([name]). Right breast: 50.4 Gy in 28 fractions.  Right breast boost: 12 Gy in 6 fractions.    05/2016 -  Anti-estrogen oral therapy    Femara (Letrozole) 2.5 mg daily.     10/17/2016 Mammogram    MM DIAG BREAST TOMO BILATERAL 10/17/2016 FINDINGS: There are bilateral lumpectomy changes in the upper central left breast and upper central right breast. No mass, nonsurgical distortion, or suspicious microcalcification is identified in either breast to suggest malignancy. Mammographic images were processed with CAD. IMPRESSION: No evidence of malignancy in either breast. Lumpectomy changes bilaterally. RECOMMENDATION: Diagnostic mammogram is suggested in 1 year. (Code:DM-B-01Y)    12/16/2017 Mammogram    IMPRESSION: No mammographic evidence of malignancy in either breast, status post bilateral mastectomies. RECOMMENDATION: Diagnostic mammogram is suggested in 1 year.      Cancer of sigmoid colon ([place]   03/04/2014 Initial Diagnosis    Cancer of sigmoid colon    03/09/2014 Pathology Results    G1 invasive adenocarcinoma, no lymph-Vascular invasion or Peri-neural invasion: Absent. MMR normal, MSI stable.  03/09/2014 Surgery    partial colectomy, negative margins.     11/10/2014 Relapse/Recurrence    biopsy confirmed oligo liver recurrence     11/10/2014 Imaging    PET showed two small ares of hypermetabolism in the right lobe of the liver, no other distant metastasis.     11/30/2014 Imaging    abdomen MRI showed a new enhancing lesion which corresponds to an area of abnormal hyper metabolism on recent PET-CT. Stable enlarged portal caval lymph node.     01/02/2015 Pathology Results    Liver biopsy showed  metastatic adenocarcinoma, IHC (+) for CK20, CDX-2, (-) for TTF-1 and ER    01/02/2015 Miscellaneous    liver biopsy KRAS mutation (+)     02/14/2015 Imaging    CT showed:  the small metastatic right hepatic lobe liver lesion is not identified for certain on this examination. No new lesions. Stable small cysts in segment 4 a.     03/05/2015 - 04/16/2015 Chemotherapy    CAPEOX (capecitabine 2500 mg twice daily Day 1-14, oxaliplatin 1 30 mg/m on day 1, every 21 days, S/P 3 cycles     08/24/2015 Procedure    CT-guided oligo liver metastasis microwave ablation by Dr. [name]    06/17/2016 Imaging    CT of the chest/abd/pelvis with contrast 1. Several new small pulmonary nodules worrisome for metastatic disease. 2. New large area of probable infiltrating recurrent tumor in the right hepatic lobe surrounding the prior ablation site. 3. Small supraclavicular lymph nodes.  Attention on future studies. 4. Stable surgical changes involving both breasts. No breast masses are identified. 5. Stable bilateral adrenal gland nodules. 6. Stable upper abdominal and right-sided retroperitoneal lymph nodes.    07/08/2016 PET scan    1. Large mass in the right lobe of the liver demonstrates diffuse hypermetabolism, compatible with recurrent metastatic disease in the liver 2. Multiple small pulmonary nodules are very similar to the recent Chest CT 06/17/16.  3. No other findings of metastatic disease elsewhere in the neck, chest, abdomen, or pelvis. 4. There is some low-level hypermetabolic activity adjacent to the surgical clips in the lateral aspect of the right breast at the site of prior lumpectomy.     07/29/2016 Pathology Results    Liver biopsy confirmed metastatic colon cancer     07/31/2016 - 07/31/2017 Chemotherapy    Chemotherapy FOLFIRI and Avastin (from cycle 2) and Leucovorin (added from cycle 8) every 2 weeks, started on 07/31/2016, multiple delay per pt's request; chemo break from  5/30-6/21/18; decreased leucovorin to 2000 mg/m2, irinotecan to 175m/m2, 5-fu 20039mm2 from 03/05/17, due to poor tolerance. Started chemo break since 07/31/17.     10/23/2016 Imaging    CT ABDOMEN PELVIS W CONTRAST 10/24/16 IMPRESSION: 1. The numerous tiny bilateral pulmonary nodules seen on the previous imaging exams are no longer evident, suggesting response of therapy. 2. Large ill-defined irregular right hepatic lesion measures smaller on today's study. 3. Slight increase in size of hepatoduodenal ligament lymphadenopathy. 4. Abdominal Aortic Atherosclerois (ICD10-170.0)    01/05/2017 Imaging    CT CAP IMPRESSION: 1. Left lower lobe pulmonary nodule and hepatic metastatic disease are stable. 2. Hepatoduodenal ligament lymph node is stable. 3. Aortic atherosclerosis (ICD10-170.0). Coronary artery calcification. 4. Enlarged pulmonary arteries, indicative of pulmonary arterial hypertension. 5. Small bilateral adrenal nodules are unchanged.    05/09/2017 Imaging    CT CAP IMPRESSION: 1. Stable appearance left lower lobe pulmonary nodule with an apparent new tiny posterior right upper lobe pulmonary nodule.  2. Dominant ill-defined right hepatic mass measures smaller on today's study. 3. Slight decrease in the hepatoduodenal ligament lymph node. 4. Aortic Atherosclerois (ICD10-170.0)    05/09/2017 Imaging    CT CAP 05/09/17 IMPRESSION: 1. Stable appearance left lower lobe pulmonary nodule with an apparent new tiny posterior right upper lobe pulmonary nodule. Continued attention on follow-up recommended. 2. Dominant ill-defined right hepatic mass measures smaller on today's study. 3. Slight decrease in the hepatoduodenal ligament lymph node. 4.  Aortic Atherosclerois (ICD10-170.0)     08/11/2017 Imaging    CT CAP W Contrast 08/11/17 IMPRESSION: 1. Essentially stable appearance of small pulmonary nodules, hypodense mass laterally in the right hepatic lobe, and mild portacaval  adenopathy. No new or progressive lesions. 2. Other imaging findings of potential clinical significance: Aortic Atherosclerosis (ICD10-I70.0). Lower lumbar impingement due to spondylosis and degenerative disc disease. Uterine fibroids. Bilateral renal cysts. Coronary atherosclerosis. Mild mitral valve calcification. Mild cardiomegaly.      09/24/2017 - 12/04/2017 Chemotherapy    Maintenance Xeloda 2500 mg twice daily days 1 through 14 of each cycle of treatment and Avastin given every 3 weeks starting on 09/24/2017. Due to slight progression this was stopped on 12/04/17     12/02/2017 Imaging    CT CAP W Contrast 12/02/17 IMPRESSION: Chest Impression: 1. Interval increase in size of bilateral small pulmonary nodules. 2. No mediastinal adenopathy  Abdomen / Pelvis Impression: 1. Dominant lesion in the RIGHT hepatic lobe is similar; however there are 2 adjacent low-density lesions in the RIGHT hepatic lobe which are not appreciated on comparison exam and concerning recurrent hepatic metastasis. 2. Interval increase in size periaortic retroperitoneal lymph nodes consistent with recurrent metastatic adenopathy.    01/08/2018 -  Chemotherapy    Due to slight disease progression she restarted on Irinotecan plus Avastin every 2 weeks on 01/08/18. Added 5-Fu bolus and Leucovorin on 04/13/18.    04/09/2018 Imaging    04/09/2018 CT CAP W Contrast IMPRESSION: 1. Slight interval increase in size as well as development of new low-attenuation lesions within the liver. 2. Grossly unchanged bilateral pulmonary nodules. 3. Similar-appearing retroperitoneal adenopathy. 4. Small amount of gas within the endometrium of the uterus, nonspecific in etiology. Recommend clinical correlation for the possibility of infectious symptoms. Consider further evaluation with pelvic ultrasound as indicated.     06/07/2018 Imaging    06/07/2018 CT CAP IMPRESSION: Chest Impression:  1. Stable bilateral pulmonary  nodules. 2. Single new pulmonary nodule in the RIGHT lower lobe with ill-defined borders. Recommend short-term follow-up CT (6-8 weeks) as this may represent a focus of pulmonary infection.  Abdomen / Pelvis Impression:  1. Stable hepatic metastasis in the RIGHT hepatic lobe. No new lesions. 2. Small periaortic retroperitoneal lymph nodes are unchanged. 3. Sigmoid anastomosis without local recurrence. 4. Mild nodular contour of liver could indicate early cirrhosis.    CURRENT TREATMENT:   1. Letrozole 2.5 mg once daily, started in September 2017 2. Irinotecan plus Avastin every 2 weeks started on 01/08/18, 5-fu bolus and leucovorin every 2 weeks added on 04/13/18    INTERIM HISTORY:   Veronica Reyes 62 y.o. female with a history of left breast cancer, colon cancer s/p laparoscopic-assisted partial colectomy (03/09/2014), PE (01/06/2014), and right breast cancer (01/2015), presents to clinic for follow up. She complained of LLL edema and pain when I saw her last time. An US Doppler done on 05/18/2018 was negative for DVT bilaterally.   Today, she is here alone and is happy about her recent  scan results. She states that she has been having runny nose especially with eating. She also noted the growth of a boil on her left upper abdominal side that is painful to touch, but without discharge.  She also states that she had bell's palsy previously and still has left sided face numbness and difficulty eating spicy food. She states that her left knee pain has improved.   She wants to go on a cruise trip in January with her husband.   MEDICAL HISTORY: Past Medical History:  Diagnosis Date  . Anxiety   . Breast CA ([place])    radiation and surgery-lt.  . Cancer ([place])    left breast cancer x2  . Hypertension   . Liver lesion   . Personal history of chemotherapy   . Personal history of radiation therapy 2017  . Pulmonary embolism ([place])    "blood clot in lungs" -dx. 4'15 with CT  Chest. On Xarelto  . Radiation 01/22/10-03/12/10   left whole breast 4500 cGy, upper aspect boosted to 6120 cGy  . Radiation 02/21/16 - 04/10/16   right breast 50.4 Gy, right breast boost 12 Gy  . Shortness of breath    sob with exertion. dx. with Pulmonary emboli 4'15 ,tx. Xarelto,Lovenox used for [employer].    ALLERGIES:  is allergic to tramadol.  MEDICATIONS:  Allergies as of 06/08/2018      Reactions   Tramadol Other (See Comments)   Dizziness      Medication List        Accurate as of 06/08/18  9:15 PM. Always use your most recent med list.          acetaminophen-codeine 300-60 MG tablet Commonly known as:  TYLENOL #4 Take 1 tablet by mouth every 8 (eight) hours as needed. for pain   amLODipine 5 MG tablet Commonly known as:  NORVASC Take 1 tablet (5 mg total) by mouth daily.   amoxicillin-clavulanate 875-125 MG tablet Commonly known as:  AUGMENTIN Take 1 tablet by mouth 2 (two) times daily.   azithromycin 500 MG tablet Commonly known as:  ZITHROMAX Take 1 tablet (500 mg total) by mouth daily.   dexamethasone 4 MG tablet Commonly known as:  DECADRON Take 1 tablet (4 mg total) by mouth daily. For 3-5 days after chemotherapy   ferrous sulfate 325 (65 FE) MG tablet Take 325 mg by mouth daily with breakfast. Pt takes  4 times per week.   hydrochlorothiazide 25 MG tablet Commonly known as:  HYDRODIURIL TAKE 1 TABLET BY MOUTH EVERY IN THE MORNING   letrozole 2.5 MG tablet Commonly known as:  FEMARA TAKE 1 TABLET BY MOUTH EVERY DAY   lidocaine-prilocaine cream Commonly known as:  EMLA Apply 1 application topically as needed.   LORazepam 0.5 MG tablet Commonly known as:  ATIVAN Take 1 tablet (0.5 mg total) by mouth every 8 (eight) hours.   magic mouthwash w/lidocaine Soln Take 5 mLs by mouth 4 (four) times daily as needed for mouth pain.   metoprolol tartrate 50 MG tablet Commonly known as:  LOPRESSOR Take 1 tablet (50 mg total) by mouth 2 (two) times daily.    metoprolol tartrate 50 MG tablet Commonly known as:  LOPRESSOR TAKE 1 TABLET BY MOUTH 2 TIMES DAILY   ondansetron 8 MG tablet Commonly known as:  ZOFRAN Take 1 tablet (8 mg total) by mouth 2 (two) times daily as needed for refractory nausea / vomiting. Start on day 3 after chemotherapy.   potassium chloride SA 20  MEQ tablet Commonly known as:  K-DUR,KLOR-CON TAKE 1 TABLET BY MOUTH 2 TIMES DAILY   prochlorperazine 10 MG tablet Commonly known as:  COMPAZINE Take 1 tablet (10 mg total) by mouth every 6 (six) hours as needed (NAUSEA).   promethazine 25 MG tablet Commonly known as:  PHENERGAN Take 1 tablet (25 mg total) by mouth every 6 (six) hours as needed for nausea or vomiting.   XARELTO 20 MG Tabs tablet Generic drug:  rivaroxaban TAKE 1 TABLET BY MOUTH EVERY DAY   zolpidem 5 MG tablet Commonly known as:  AMBIEN Take 1 tablet (5 mg total) by mouth at bedtime as needed for sleep.       SURGICAL HISTORY:  Past Surgical History:  Procedure Laterality Date  . BREAST BIOPSY Left 2010   malignant  . BREAST BIOPSY Right 01/2015   malignant  . BREAST LUMPECTOMY Left 2006  . BREAST LUMPECTOMY Left 2010  . BREAST LUMPECTOMY Right 11/07/2015  . BREAST LUMPECTOMY WITH RADIOACTIVE SEED AND SENTINEL LYMPH NODE BIOPSY Right 11/07/2015   Procedure: BREAST LUMPECTOMY WITH RADIOACTIVE SEED AND SENTINEL LYMPH NODE BIOPSY;  Surgeon: Stark Klein, MD;  Location: [facility];  Service: General;  Laterality: Right;  . BREAST SURGERY     '11- Left breast lumpectomy  . CESAREAN SECTION    . CHOLECYSTECTOMY     Laparoscopic 20 yrs ago  . COLON SURGERY  03-09-14   partial colectomy for cancer  . IR GENERIC HISTORICAL  07/29/2016   IR FLUORO GUIDE PORT INSERTION RIGHT 07/29/2016 Sandi Mariscal, MD [facility]  . IR GENERIC HISTORICAL  07/29/2016   IR US GUIDE VASC ACCESS RIGHT 07/29/2016 Sandi Mariscal, MD [facility]  . IR GENERIC HISTORICAL  07/29/2016   IR US GUIDE BX  ASP/DRAIN 07/29/2016 [facility]  . PARTIAL COLECTOMY N/A 03/09/2014   Procedure: LAPAROSCOPIC ASSISTED PARTIAL COLECTOMY, splenic flexure mobilization;  Surgeon: Leighton Ruff, MD;  Location: [facility];  Service: General;  Laterality: N/A;  . RADIOFREQUENCY ABLATION Right 08/24/2015   Procedure: MICROWAVE ABLATION RIGHT LIVER LOBE ;  Surgeon: Corrie Mckusick, DO;  Location: [facility];  Service: Anesthesiology;  Laterality: Right;    REVIEW OF SYSTEMS:  Constitutional: Denies fevers, chills or abnormal weight loss  (+) hair loss  Eyes: Denies blurriness of vision Ears, nose, mouth, throat, and face: Denies mucositis, no ulcers (+) sensitivity to spicy food, which she relates to bells palsy. Respiratory: Denies cough, dyspnea or wheezes   Cardiovascular: Denies palpitation, chest discomfort. No new LL edema  Gastrointestinal:  Denies heartburn or change in bowel habits. (+) left sided boil, painful without discharge Skin: Denies abnormal skin rashes MSK: No myalgia, or joint pain (+) improving left knee pain Lymphatics: Denies new lymphadenopathy or easy bruising Neurological:Denies numbness, tingling or new weaknesses   Behavioral/Psych: Mood is stable, no new changes  All other systems were reviewed with the patient and are negative.    PHYSICAL EXAMINATION:   ECOG PERFORMANCE STATUS: 1 Vitals:   06/08/18 1129  BP: (!) 140/94  Pulse: (!) 107  Resp: 19  Temp: 99.6 F (37.6 C)  SpO2: 100%   GENERAL:alert, no distress and comfortable; well developed and obese. Easily mobile to exam table. (+) hair loss SKIN: skin color, texture, turgor are normal, no rashes or significant lesions EYES: normal, Conjunctiva are pink and non-injected, sclera clear OROPHARYNX:no exudate, no erythema and lips, buccal mucosa, and tongue normal  NECK: supple, thyroid normal size, non-tender, without nodularity LYMPH:  no  palpable lymphadenopathy in the cervical, axillary or supraclavicular LUNGS: clear to  auscultation with normal breathing effort, no wheezes or rhonchi  HEART: regular rate & rhythm and no murmurs  ABDOMEN:abdomen soft, non-tender and normal bowel sounds; incision healing with signs of infection.  Mild hepatomegaly with tenderness at the RUQ, no abdominal tenderness. No local megaly (+) left sided abdominal boil that is 2x2.5cm with skin erythema on the center, no skin discharge, see picture below Musculoskeletal:no cyanosis of digits and no clubbing, limited ROM of left shoulder, no myalgia or other joint pain, leg pain improved NEURO: alert & oriented x 3 with fluent speech, no focal motor/sensory deficits  Photo taken on 06/08/2018       Labs:  CBC Latest Ref Rng & Units 06/08/2018 05/26/2018 05/11/2018  WBC 3.9 - 10.3 K/uL 9.1 7.9 8.5  Hemoglobin 11.6 - 15.9 g/dL 12.8 13.0 12.7  Hematocrit 34.8 - 46.6 % 39.1 40.0 39.5  Platelets 145 - 400 K/uL 253 214 275    CMP Latest Ref Rng & Units 06/08/2018 05/26/2018 05/11/2018  Glucose 70 - 99 mg/dL 118(H) 140(H) 159(H)  BUN 8 - 23 mg/dL 7(L) 8 7(L)  Creatinine 0.44 - 1.00 mg/dL 0.82 0.98 0.85  Sodium 135 - 145 mmol/L 141 141 142  Potassium 3.5 - 5.1 mmol/L 3.7 3.6 3.7  Chloride 98 - 111 mmol/L 104 104 104  CO2 22 - 32 mmol/L '26 22 27  ' Calcium 8.9 - 10.3 mg/dL 10.2 9.9 9.8  Total Protein 6.5 - 8.1 g/dL 7.8 7.6 7.5  Total Bilirubin 0.3 - 1.2 mg/dL 0.5 0.5 0.4  Alkaline Phos 38 - 126 U/L 58 57 61  AST 15 - 41 U/L 30 27 32  ALT 0 - 44 U/L '24 20 26    ' CEA  01/18/2014: 0.8 03/01/2015: 1.3 01/31/2016: 4.8 05/28/2016: 48 07/31/2016: 173.7 08/28/16: 139 09/25/2016: 43 11/04/16: 9.94 12/18/2016: 6.99 01/29/17: 4.58 03/04/17: 6.09 04/09/17: 4.22 05/07/17: 5.29 06/04/17: 4.12 07/02/17: 6.78 07/30/17 : 5.35  09/24/17: 5.41 10/23/17: 8.03 11/13/17: 12.32 01/01/2018: 27.70 02/05/2018: 39.93 03/12/18: 36.22  04/13/2018: 38.65 05/11/18: 37.13 06/08/2018: 37.38  PATHOLOGY RESULT Diagnosis 01/02/2015 Liver, needle/core biopsy, right -  POSITIVE FOR METASTATIC ADENOCARCINOMA. - SEE COMMENT.  Diagnosis 11/07/2015 1. Breast, lumpectomy, Right - INVASIVE DUCTAL CARCINOMA, GRADE 2, SPANNING 1 CM. - DUCTAL CARCINOMA IN SITU, LOW GRADE. - RESECTION MARGINS ARE NEGATIVE FOR INVASIVE CARCINOMA. - DUCTAL CARCINOMA IN SITU COMES TO WITHIN 0.3 CM OF THE POSTERIOR MARGIN. - BIOPSY SITE. - SEE ONCOLOGY TABLE. 2. Lymph node, sentinel, biopsy, right axillary #1 - ONE OF ONE LYMPH NODES NEGATIVE FOR CARCINOMA (0/1). 3. Lymph node, sentinel, biopsy, right axillary #2 - ONE OF ONE LYMPH NODES NEGATIVE FOR CARCINOMA (0/1). 4. Lymph node, sentinel, biopsy, right axillary #3 - ONE OF ONE LYMPH NODES NEGATIVE FOR CARCINOMA (0/1).  ONCOTYPE DX: RS 40, which predicts a year risk of distant recurrence with tamoxifen alone 14%   Diagnosis 07/29/2016 Liver, needle/core biopsy, posterior of right lobe METASTATIC ADENOCARCINOMA, CONSISTENT WITH COLONIC PRIMARY.  RADIOLOGY STUDIES   06/07/2018 CT CAP IMPRESSION: Chest Impression:  1. Stable bilateral pulmonary nodules. 2. Single new pulmonary nodule in the RIGHT lower lobe with ill-defined borders. Recommend short-term follow-up CT (6-8 weeks) as this may represent a focus of pulmonary infection.  Abdomen / Pelvis Impression:  1. Stable hepatic metastasis in the RIGHT hepatic lobe. No new lesions. 2. Small periaortic retroperitoneal lymph nodes are unchanged. 3. Sigmoid anastomosis without local recurrence. 4. Mild nodular contour of  liver could indicate early cirrhosis.  04/09/2018 CT CAP W Contrast IMPRESSION: 1. Slight interval increase in size as well as development of new low-attenuation lesions within the liver. 2. Grossly unchanged bilateral pulmonary nodules. 3. Similar-appearing retroperitoneal adenopathy. 4. Small amount of gas within the endometrium of the uterus, nonspecific in etiology. Recommend clinical correlation for the possibility of infectious symptoms.  Consider further evaluation with pelvic ultrasound as indicated.  12/16/2017 Diagnostic Mammogram IMPRESSION: No mammographic evidence of malignancy in either breast, status post bilateral mastectomies.  12/03/2017 CT CAP W Contrast IMPRESSION: Chest Impression:  1. Interval increase in size of bilateral small pulmonary nodules. 2. No mediastinal adenopathy  Abdomen / Pelvis Impression:  1. Dominant lesion in the RIGHT hepatic lobe is similar; however there are 2 adjacent low-density lesions in the RIGHT hepatic lobe which are not appreciated on comparison exam and concerning recurrent hepatic metastasis. 2. Interval increase in size periaortic retroperitoneal lymph nodes consistent with recurrent metastatic adenopathy   PET 07/08/2016 IMPRESSION: 1. Large mass in the right lobe of the liver demonstrates diffuse hypermetabolism, compatible with recurrent metastatic disease in the liver. 2. Multiple small pulmonary nodules are very similar to the recent chest CT 06/17/2016. These do not demonstrate hypermetabolism on the PET images, however, these lesions are well below the level of PET imaging. Given that these nodules are new compared to prior chest CT 12/18/2015, they are concerning for potential metastatic disease to the lungs, and continued attention on followup studies is recommended. 3. No other findings of metastatic disease elsewhere in the neck, chest, abdomen or pelvis. 4. There is some low-level hypermetabolic activity adjacent to the surgical clips in the lateral aspect of the right breast at the site of prior lumpectomy. This area has undergone interval involution over the several prior examinations, and this low-level activity is favored to be related to normal healing. Attention on follow-up imaging is recommended to ensure the stability or resolution of this finding. 5. Aortic atherosclerosis, in addition to 2 vessel coronary artery disease. Please note that  although the presence of coronary artery calcium documents the presence of coronary artery disease, the severity of this disease and any potential stenosis cannot be assessed on this non-gated CT examination. Assessment for potential risk factor modification, dietary therapy or pharmacologic therapy may be warranted, if clinically indicated. 6. Additional incidental findings, as above.  MM DIAG BREAST TOMO BILATERAL 10/17/2016 FINDINGS: There are bilateral lumpectomy changes in the upper central left breast and upper central right breast. No mass, nonsurgical distortion, or suspicious microcalcification is identified in either breast to suggest malignancy. Mammographic images were processed with CAD. IMPRESSION: No evidence of malignancy in either breast. Lumpectomy changes bilaterally. RECOMMENDATION: Diagnostic mammogram is suggested in 1 year. (Code:DM-B-01Y)  CT CHEST, ABDOMEN PELVIS W CONTRAST 10/24/16 IMPRESSION: 1. The numerous tiny bilateral pulmonary nodules seen on the previous imaging exams are no longer evident, suggesting response of therapy. 2. Large ill-defined irregular right hepatic lesion measures smaller on today's study. 3. Slight increase in size of hepatoduodenal ligament lymphadenopathy. 4. Abdominal Aortic Atherosclerois (ICD10-170.0)  CT CAP 01/05/17 IMPRESSION: 1. Left lower lobe pulmonary nodule and hepatic metastatic disease are stable. 2. Hepatoduodenal ligament lymph node is stable. 3. Aortic atherosclerosis (ICD10-170.0). Coronary artery calcification. 4. Enlarged pulmonary arteries, indicative of pulmonary arterial hypertension. 5. Small bilateral adrenal nodules are unchanged.  CT CAP 05/09/17 IMPRESSION: 1. Stable appearance left lower lobe pulmonary nodule with an apparent new tiny posterior right upper lobe pulmonary nodule. Continued attention on follow-up  recommended. 2. Dominant ill-defined right hepatic mass measures smaller on today's  study. 3. Slight decrease in the hepatoduodenal ligament lymph node. 4.  Aortic Atherosclerois (ICD10-170.0)   CT CAP W Contrast 08/11/17 IMPRESSION: 1. Essentially stable appearance of small pulmonary nodules, hypodense mass laterally in the right hepatic lobe, and mild portacaval adenopathy. No new or progressive lesions. 2. Other imaging findings of potential clinical significance: Aortic Atherosclerosis (ICD10-I70.0). Lower lumbar impingement due to spondylosis and degenerative disc disease. Uterine fibroids. Bilateral renal cysts. Coronary atherosclerosis. Mild mitral valve calcification. Mild cardiomegaly.   CT CAP W Contrast 12/02/17 IMPRESSION: Chest Impression: 1. Interval increase in size of bilateral small pulmonary nodules. 2. No mediastinal adenopathy  Abdomen / Pelvis Impression: 1. Dominant lesion in the RIGHT hepatic lobe is similar; however there are 2 adjacent low-density lesions in the RIGHT hepatic lobe which are not appreciated on comparison exam and concerning recurrent hepatic metastasis. 2. Interval increase in size periaortic retroperitoneal lymph nodes consistent with recurrent metastatic adenopathy.  MM DIAG BREAST, 12/16/2017 IMPRESSION: No mammographic evidence of malignancy in either breast, status post bilateral mastectomies.  ASSESSMENT:   Oda Kilts 62 y.o. female   1. Colon Cancer, Stage I (pT2N0), MMR normal, oligo recurrent disease in liver in 12/2014, KRAS mutation (+), liver and lung recurrence in 06/2016 -I previously reviewed the nature history of metastatic colon cancer, and the potential curative approach with chemotherapy followed by liver lesion ablation or resection. However, more likely, this is not curable disease. -She initially received 3 cycles of Capox, did not want to proceed with fourth cycle due to the moderate side effects from previous chemotherapy treatment. She declined liver mass resection, and underwent  liver ablation by interventional radiologist Dr. Earleen Newport in 08/2015. -I previously reviewed her restaging CT scan from 06/17/2016, which unfortunately showed new area of infiltrative disease as the previous ablated liver lesion, likely recurrence. She also developed several new pulmonary nodules, small, concerning for metastatic disease. 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 -She started chemotherapy FOLFIRI and Avastin on 08/13/16. Inially she tolerated well but after many cycles she did develop side effects. I previously had to decrease her chemo dose a few times or pt requested a break several times for multiple reasons. Last FOLFIRI chemo was on 07/30/17 (cycle 18). -She did have a good response to chemo, her CEA significantly decreased. CT scan from 05/09/2017 showed continuous response to treatment, her liver metastasis slightly decreased in size.  -After CT CAP from 08/11/17 which showed stable controlled disease, I was able to switch her to maintenance therapy. She started Xeloda with Avastin every 3 weeks on 09/24/17.  -CT CAP W Contrast from 12/02/17 reveals interval increase in size of bilateral small pulmonary nodules and two new lesions in the right hepatic lobe, although her previous liver metastasis in the right lobe has decreased in size.  -Her treatment was subsequently changed back to irinotecan and avastin. Pt declined 5-fu pump on 01/08/18 -She was not very compliant with treatment, due to her trips, her husband's surgery etc. - A 04/09/2018 CT CAP W Contrast showed Slight interval increase in size as well as development of new metastatic lesions within the liver, Grossly unchanged bilateral pulmonary nodules, and Similar-appearing retroperitoneal adenopathy. I reviewed with pt in detail. This scan was compared to her scan 4 months previous, however she only received 1 cycle of urine taken in the first 2 months after  previous scan, some of her  disease progression is probably related to lack of consistent treatment. -Due to the disease progression, I previously recommended her to add a 5-FU pump infusion back. However patient still declined. After lengthy discussion, she is agreeable to add 5-FU bolus and leucovorin with each irinotecan treatment on 04/13/18.  -She has tolerated the addition of 5-FU bolus and leucovorin well and her mucositis has much improved -I reviewed her restaging CT scan from yesterday, which showed stable disease, no new lesions except a 13 mm lung nodule which is indeterminate, will f/u closely. -Labs reviewed, CBC WNL with RDW 19.9, CMP was pending. Will proceed with treatment irinotecan, Avastin, 5-FU bolus and leucovorin today and continue every 2 weeks. -She is clinically doing well, right upper quadrant abdominal pain is well managed.  -Restaging CT scan in 2-3 months  -Continue chemo and follow-up every 2 weeks  2. Right breast cancer, pT1bN0M0, stage Ia, ER+/PR+/HER2-, History of left Breast Cancer, diagnosed on 02/01/2015 -Left CIS in situ, s/p lumpectomy in 2006 and 2011 by Dr. Margot Chimes, and radiation in 2011 by Dr. Sondra Come.  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 Surgical margins were negative. -I previously reviewed her Oncotype DX result. Her recurrence score is 22, which predicts 14% 10 year risk of distant recurrence with tamoxifen alone. This is intermediate risk, based on her relatively low number, I do not recommend adjuvant chemotherapy. -She has completed adjuvant breast radiation with Dr. Sondra Come in July 2017.  -her [facility] level supported she is postmenopausal -She started adjuvant letrozole in 05/2016, tolerating well, we'll continue for a total of 5-7 years  -We previously discussed breast cancer surveillance, I strongly encouraged her to continue annual screening mammogram, self  exam, and follow-up with [place] routinely. -diagnostic mammogram on 12/16/2017 showed: No mammographic evidence of malignancy in either breast, status post bilateral mastectomies.   3. History of PE (01/06/2014) -She has a diagnosis of pulmonary embolism, likely secondary to malignancy in the setting of family history for stroke (and possible stroke history in patient as well). Lower extremity dopplers negative.  -Continue Xarelto. Due to her metastatic colon cancer, I previously recommended her to continue indefinitely.  4.  Uterine Fibroids  --Negative endometrial biopsy. Following with Gynecology.  5. Kidney lesion (Right) -No hypermetabolic right kidney lesion on the pet scan -Repeat abdominal MRI 05/29/2015 showed unchanged right renal lesion, favored to represent a complex/septate cyst.  6. Right flank pain, left Knee discomfort  -possibly related to her liver metastasis -I previously suggested she see a orthopedics for her knee issues.  -Continue Tylenol No. 4 as needed for pain. Her abdominal pain overall has improved, controlled.  -Her left knee swelling has improved, pain is stable    7.  Morbid obesity - I have previously encouraged her to eat healthy and exercise more.  8. HTN  -We previously discussed that Avastin can increase her blood pressure, I recommend her to continue monitoring at home -She was mildly hypertensive on 07/16/17, we will increase her dose of Amlodipine to 66m if needed in the future.  -Continue Amlodipine, lopressor and HCTZ  -BP stable  9. Hypokalemia  -Given intermittent diarrhea she will continue oral K supplement for now at 1 tab BID.  -Continue oral K BID  11. Left Abdominal wall Abscess -she has developed a boil on LUQ abdominal wall, 2x2.5 cm without discharge -I prescribed augmentin and advised her to monitor -If it didn't improve, I  recommend I&D because she is on chemotherapy.    Plan  -Labs reviewed and adequate to proceed with chemo  treatment today, will hold Avastin today due to the small abdominal wall abcess  -Lab, flush, f/u with me or Lacie and chemo irinotecan/5-fu bolus/LV and avastin in 2, 4, 6 and 8 weeks  -I prescribed Augmentin for the abdominal wall abscess   All questions were answered. The patient knows to call the clinic with any problems, questions or concerns. We can certainly see the patient much sooner if necessary.  I spent 20 minutes counseling the patient face to face. The total time spent in the appointment was 25 minutes.  Dierdre Searles Dweik am acting as scribe for Dr. [name].  I have reviewed the above documentation for accuracy and completeness, and I agree with the above.    [name]  06/08/2018

## 2018-06-07 ENCOUNTER — Other Ambulatory Visit: Payer: BLUE CROSS/BLUE SHIELD

## 2018-06-07 ENCOUNTER — Ambulatory Visit (HOSPITAL_COMMUNITY)
Admission: RE | Admit: 2018-06-07 | Discharge: 2018-06-07 | Disposition: A | Discharge status: Home / Self Care | Payer: BLUE CROSS/BLUE SHIELD | Source: Ambulatory Visit | Attending: Hematology | Admitting: Hematology

## 2018-06-07 ENCOUNTER — Ambulatory Visit: Payer: BLUE CROSS/BLUE SHIELD | Admitting: Nurse Practitioner

## 2018-06-07 DIAGNOSIS — R590 Localized enlarged lymph nodes: Secondary | ICD-10-CM | POA: Insufficient documentation

## 2018-06-07 DIAGNOSIS — C187 Malignant neoplasm of sigmoid colon: Secondary | ICD-10-CM | POA: Insufficient documentation

## 2018-06-07 DIAGNOSIS — C787 Secondary malignant neoplasm of liver and intrahepatic bile duct: Secondary | ICD-10-CM | POA: Insufficient documentation

## 2018-06-07 DIAGNOSIS — K7689 Other specified diseases of liver: Secondary | ICD-10-CM | POA: Diagnosis not present

## 2018-06-07 MED ORDER — IOHEXOL 300 MG/ML  SOLN
100.0000 mL | Freq: Once | INTRAMUSCULAR | Status: AC | PRN
  Administered 2018-06-07: 100 mL via INTRAVENOUS

## 2018-06-08 ENCOUNTER — Inpatient Hospital Stay: Payer: BLUE CROSS/BLUE SHIELD

## 2018-06-08 ENCOUNTER — Inpatient Hospital Stay (HOSPITAL_BASED_OUTPATIENT_CLINIC_OR_DEPARTMENT_OTHER): Payer: BLUE CROSS/BLUE SHIELD | Admitting: Hematology

## 2018-06-08 ENCOUNTER — Ambulatory Visit: Payer: BLUE CROSS/BLUE SHIELD

## 2018-06-08 ENCOUNTER — Encounter: Payer: Self-pay | Admitting: Hematology

## 2018-06-08 VITALS — BP 140/94 | HR 107 | Temp 99.6°F | Resp 19 | Ht 66.0 in | Wt 298.3 lb

## 2018-06-08 DIAGNOSIS — C187 Malignant neoplasm of sigmoid colon: Secondary | ICD-10-CM | POA: Diagnosis not present

## 2018-06-08 DIAGNOSIS — C7800 Secondary malignant neoplasm of unspecified lung: Secondary | ICD-10-CM | POA: Diagnosis not present

## 2018-06-08 DIAGNOSIS — L02211 Cutaneous abscess of abdominal wall: Secondary | ICD-10-CM

## 2018-06-08 DIAGNOSIS — C787 Secondary malignant neoplasm of liver and intrahepatic bile duct: Secondary | ICD-10-CM | POA: Diagnosis not present

## 2018-06-08 DIAGNOSIS — Z5112 Encounter for antineoplastic immunotherapy: Secondary | ICD-10-CM | POA: Diagnosis not present

## 2018-06-08 DIAGNOSIS — I10 Essential (primary) hypertension: Secondary | ICD-10-CM

## 2018-06-08 DIAGNOSIS — Z86711 Personal history of pulmonary embolism: Secondary | ICD-10-CM

## 2018-06-08 DIAGNOSIS — C50511 Malignant neoplasm of lower-outer quadrant of right female breast: Secondary | ICD-10-CM | POA: Diagnosis not present

## 2018-06-08 DIAGNOSIS — E876 Hypokalemia: Secondary | ICD-10-CM

## 2018-06-08 DIAGNOSIS — Z170 Estrogen receptor positive status [ER+]: Secondary | ICD-10-CM

## 2018-06-08 DIAGNOSIS — Z853 Personal history of malignant neoplasm of breast: Secondary | ICD-10-CM

## 2018-06-08 LAB — CMP (CANCER CENTER ONLY)
ALBUMIN: 3.2 g/dL — AB (ref 3.5–5.0)
ALK PHOS: 58 U/L (ref 38–126)
ALT: 24 U/L (ref 0–44)
AST: 30 U/L (ref 15–41)
Anion gap: 11 (ref 5–15)
BILIRUBIN TOTAL: 0.5 mg/dL (ref 0.3–1.2)
BUN: 7 mg/dL — AB (ref 8–23)
CO2: 26 mmol/L (ref 22–32)
CREATININE: 0.82 mg/dL (ref 0.44–1.00)
Calcium: 10.2 mg/dL (ref 8.9–10.3)
Chloride: 104 mmol/L (ref 98–111)
GFR, Est AFR Am: >60 mL/min (ref >60)
GLUCOSE: 118 mg/dL — AB (ref 70–99)
Potassium: 3.7 mmol/L (ref 3.5–5.1)
Sodium: 141 mmol/L (ref 135–145)
TOTAL PROTEIN: 7.8 g/dL (ref 6.5–8.1)

## 2018-06-08 LAB — TOTAL PROTEIN, URINE DIPSTICK: Protein, ur: NEGATIVE mg/dL

## 2018-06-08 LAB — CBC WITH DIFFERENTIAL (CANCER CENTER ONLY)
BASOS PCT: 1 %
Basophils Absolute: 0.1 10*3/uL (ref 0.0–0.1)
EOS ABS: 0.1 10*3/uL (ref 0.0–0.5)
Eosinophils Relative: 1 %
HEMATOCRIT: 39.1 % (ref 34.8–46.6)
HEMOGLOBIN: 12.8 g/dL (ref 11.6–15.9)
LYMPHS ABS: 2.8 10*3/uL (ref 0.9–3.3)
Lymphocytes Relative: 31 %
MCH: 31 pg (ref 25.1–34.0)
MCHC: 32.9 g/dL (ref 31.5–36.0)
MCV: 94.2 fL (ref 79.5–101.0)
MONO ABS: 0.6 10*3/uL (ref 0.1–0.9)
Monocytes Relative: 7 %
Neutro Abs: 5.5 10*3/uL (ref 1.5–6.5)
Neutrophils Relative %: 60 %
Platelet Count: 253 10*3/uL (ref 145–400)
RBC: 4.15 MIL/uL (ref 3.70–5.45)
RDW: 19.9 % — AB (ref 11.2–14.5)
WBC Count: 9.1 10*3/uL (ref 3.9–10.3)

## 2018-06-08 LAB — CEA (IN HOUSE-CHCC): CEA (CHCC-IN HOUSE): 37.38 ng/mL — AB (ref 0.00–5.00)

## 2018-06-08 MED ORDER — HEPARIN SOD (PORK) LOCK FLUSH 100 UNIT/ML IV SOLN
500.0000 [IU] | Freq: Once | INTRAVENOUS | Status: AC | PRN
  Administered 2018-06-08: 500 [IU]
  Filled 2018-06-08: qty 5

## 2018-06-08 MED ORDER — ATROPINE SULFATE 1 MG/ML IJ SOLN
0.5000 mg | Freq: Once | INTRAMUSCULAR | Status: AC | PRN
  Administered 2018-06-08: 0.5 mg via INTRAVENOUS

## 2018-06-08 MED ORDER — AMOXICILLIN-POT CLAVULANATE 875-125 MG PO TABS: 1.0000 | ORAL_TABLET | Freq: Two times a day (BID) | ORAL | 0 refills | Status: DC

## 2018-06-08 MED ORDER — PALONOSETRON HCL INJECTION 0.25 MG/5ML
INTRAVENOUS | Status: AC
  Filled 2018-06-08: qty 5

## 2018-06-08 MED ORDER — PROCHLORPERAZINE MALEATE 10 MG PO TABS
ORAL_TABLET | ORAL | Status: AC
  Filled 2018-06-08: qty 1

## 2018-06-08 MED ORDER — IRINOTECAN HCL CHEMO INJECTION 100 MG/5ML
130.0000 mg/m2 | Freq: Once | INTRAVENOUS | Status: AC
  Administered 2018-06-08: 320 mg via INTRAVENOUS
  Filled 2018-06-08: qty 15

## 2018-06-08 MED ORDER — SODIUM CHLORIDE 0.9 % IV SOLN
Freq: Once | INTRAVENOUS | Status: AC
  Administered 2018-06-08: 13:00:00 via INTRAVENOUS
  Filled 2018-06-08: qty 250

## 2018-06-08 MED ORDER — SODIUM CHLORIDE 0.9 % IV SOLN: 700.0000 mg | Freq: Once | INTRAVENOUS | Status: DC

## 2018-06-08 MED ORDER — PROCHLORPERAZINE MALEATE 10 MG PO TABS
10.0000 mg | ORAL_TABLET | Freq: Once | ORAL | Status: AC | PRN
  Administered 2018-06-08: 10 mg via ORAL

## 2018-06-08 MED ORDER — SODIUM CHLORIDE 0.9 % IV SOLN
750.0000 mL | Freq: Once | INTRAVENOUS | Status: AC
  Administered 2018-06-08: 250 mL via INTRAVENOUS
  Filled 2018-06-08: qty 750

## 2018-06-08 MED ORDER — SODIUM CHLORIDE 0.9 % IV SOLN
Freq: Once | INTRAVENOUS | Status: AC
  Administered 2018-06-08: 13:00:00 via INTRAVENOUS
  Filled 2018-06-08: qty 5

## 2018-06-08 MED ORDER — LEUCOVORIN CALCIUM INJECTION 350 MG
400.0000 mg/m2 | Freq: Once | INTRAVENOUS | Status: AC
  Administered 2018-06-08: 976 mg via INTRAVENOUS
  Filled 2018-06-08: qty 48.8

## 2018-06-08 MED ORDER — PALONOSETRON HCL INJECTION 0.25 MG/5ML
0.2500 mg | Freq: Once | INTRAVENOUS | Status: AC
  Administered 2018-06-08: 0.25 mg via INTRAVENOUS

## 2018-06-08 MED ORDER — FLUOROURACIL CHEMO INJECTION 2.5 GM/50ML
405.0000 mg/m2 | Freq: Once | INTRAVENOUS | Status: AC
  Administered 2018-06-08: 1000 mg via INTRAVENOUS
  Filled 2018-06-08: qty 20

## 2018-06-08 MED ORDER — ATROPINE SULFATE 1 MG/ML IJ SOLN
INTRAMUSCULAR | Status: AC
  Filled 2018-06-08: qty 1

## 2018-06-08 MED ORDER — SODIUM CHLORIDE 0.9% FLUSH
10.0000 mL | INTRAVENOUS | Status: DC | PRN
  Administered 2018-06-08: 10 mL
  Filled 2018-06-08: qty 10

## 2018-06-08 NOTE — Patient Instructions (Signed)
[facility] Discharge Instructions for Patients Receiving Chemotherapy  Today you received the following chemotherapy agents Avastin, Irinotecan, Leucovori, Florouracil.  To help prevent nausea and vomiting after your treatment, we encourage you to take your nausea medication as directed.  If you develop nausea and vomiting that is not controlled by your nausea medication, call the clinic.   BELOW ARE SYMPTOMS THAT SHOULD BE REPORTED IMMEDIATELY:  *FEVER GREATER THAN 100.5 F  *CHILLS WITH OR WITHOUT FEVER  NAUSEA AND VOMITING THAT IS NOT CONTROLLED WITH YOUR NAUSEA MEDICATION  *UNUSUAL SHORTNESS OF BREATH  *UNUSUAL BRUISING OR BLEEDING  TENDERNESS IN MOUTH AND THROAT WITH OR WITHOUT PRESENCE OF ULCERS  *URINARY PROBLEMS  *BOWEL PROBLEMS  UNUSUAL RASH Items with * indicate a potential emergency and should be followed up as soon as possible.  Feel free to call the clinic you have any questions or concerns. The clinic phone number is [phone].

## 2018-06-08 NOTE — Progress Notes (Signed)
Per Dr. Burr Medico, hold Avastin today given abscess and potential I&D.  Infusion RN made aware. Kennith Center, Pharm.D., CPP 06/08/2018@1 :38 PM

## 2018-06-11 ENCOUNTER — Telehealth: Payer: Self-pay | Admitting: Hematology

## 2018-06-11 NOTE — Telephone Encounter (Signed)
Scheduled apt per 9/24 los - pt is aware of appt added - my chart active - husband says she will check my chart.

## 2018-06-14 ENCOUNTER — Other Ambulatory Visit: Payer: Self-pay | Admitting: Hematology

## 2018-06-14 DIAGNOSIS — C187 Malignant neoplasm of sigmoid colon: Secondary | ICD-10-CM

## 2018-06-15 ENCOUNTER — Other Ambulatory Visit: Payer: Self-pay | Admitting: *Deleted

## 2018-06-15 ENCOUNTER — Other Ambulatory Visit: Payer: Self-pay | Admitting: Nurse Practitioner

## 2018-06-15 DIAGNOSIS — I10 Essential (primary) hypertension: Secondary | ICD-10-CM

## 2018-06-15 DIAGNOSIS — C187 Malignant neoplasm of sigmoid colon: Secondary | ICD-10-CM

## 2018-06-15 MED ORDER — AMLODIPINE BESYLATE 5 MG PO TABS: 5.0000 mg | ORAL_TABLET | Freq: Every day | ORAL | 0 refills | Status: DC

## 2018-06-15 MED ORDER — ACETAMINOPHEN-CODEINE #4 300-60 MG PO TABS: 1.0000 | ORAL_TABLET | Freq: Three times a day (TID) | ORAL | 0 refills | Status: DC | PRN

## 2018-06-15 MED ORDER — HYDROCHLOROTHIAZIDE 25 MG PO TABS: ORAL_TABLET | ORAL | 1 refills | Status: DC

## 2018-06-22 ENCOUNTER — Ambulatory Visit: Payer: BLUE CROSS/BLUE SHIELD | Admitting: Nurse Practitioner

## 2018-06-22 ENCOUNTER — Other Ambulatory Visit: Payer: BLUE CROSS/BLUE SHIELD

## 2018-06-23 ENCOUNTER — Telehealth: Payer: Self-pay

## 2018-06-23 ENCOUNTER — Inpatient Hospital Stay: Payer: BLUE CROSS/BLUE SHIELD | Admitting: Hematology

## 2018-06-23 ENCOUNTER — Inpatient Hospital Stay: Payer: BLUE CROSS/BLUE SHIELD

## 2018-06-23 NOTE — Telephone Encounter (Signed)
Spoke with patient per Dr. Burr Medico notified her because of HTN can only take Coricidin for cold, denies fever checked this morning was 98, instructed to call us back if fever over 100.5 or worsening symptoms, patient verbalized an understanding.

## 2018-07-05 ENCOUNTER — Other Ambulatory Visit: Payer: Self-pay | Admitting: Hematology

## 2018-07-05 DIAGNOSIS — C187 Malignant neoplasm of sigmoid colon: Secondary | ICD-10-CM

## 2018-07-05 NOTE — Progress Notes (Signed)
[name]  [phone] [phone]  Clinic Follow Up Note   DATE OF VISIT: 07/05/2018   Veronica Docker, MD [address] [place] [address]  DIAGNOSIS: recurrent No diagnosis found.  PROBLEM LIST 1. Left breast DCIS diagnosed in 2006 and 2010, status post lumpectomy. 2. pT2N0M0 stage I colon cancer, s/p partial colectomy on 03/09/2014  3. PE in 12/2013 when she was diagnosed with colon cancer. 4. 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 [facility])   Staging form: Breast, AJCC 7th Edition     Clinical stage from 02/01/2015: Stage IA (T1a, N0, M0) - Signed by [name], MD on 06/04/2015     Pathologic stage from 11/10/2015: Stage IA (T1b, N0, M0) - Signed by [name], MD on 12/08/2015 Cancer of sigmoid colon [facility])   Staging form: Colon and Rectum, AJCC 7th Edition     Clinical: Stage I (T2, N0, M0) - Unsigned     Breast cancer of lower-outer quadrant of right female breast ([place])   2006 Cancer Diagnosis    Left breast DCIS diagnosed in 2006 and 2010, status post lumpectomy.    01/07/2014 Initial Diagnosis    Breast cancer    01/18/2015 Mammogram    72m mass in lower outer quadrant of right breast     02/01/2015 Initial Biopsy    (R) breast mass biopsy showed invasive ductal carcinoma & DCIS, grade 1-2.     02/01/2015 Receptors her2    ER 90%+, PR 10%+, Ki67 10%, HER2 negative (ratio 1.09)     11/07/2015 Surgery    (R) breast lumpectomy and sentinel lymph node biopsy ([name]    11/07/2015 Pathology Results    (R) breast lumpectomy showed invasive ductal carcinoma, grade 2, 1 cm, low-grade DCIS, negative margins, 3 sentinel lymph nodes were negative. LVI (-). HER2 repeated and remains negative (ratio 1.29).      11/07/2015 Pathologic Stage    pT1b,pN0: Stage IA     11/07/2015 Oncotype testing    RS 22, which predicts a year risk of  distant recurrence with tamoxifen alone 14% (intermediate-risk). Adjuvant chemo was not offered    02/21/2016 - 04/10/2016 Radiation Therapy    Adjuvant breast radiation. ([name]). Right breast: 50.4 Gy in 28 fractions.  Right breast boost: 12 Gy in 6 fractions.    05/2016 -  Anti-estrogen oral therapy    Femara (Letrozole) 2.5 mg daily.     10/17/2016 Mammogram    MM DIAG BREAST TOMO BILATERAL 10/17/2016 FINDINGS: There are bilateral lumpectomy changes in the upper central left breast and upper central right breast. No mass, nonsurgical distortion, or suspicious microcalcification is identified in either breast to suggest malignancy. Mammographic images were processed with CAD. IMPRESSION: No evidence of malignancy in either breast. Lumpectomy changes bilaterally. RECOMMENDATION: Diagnostic mammogram is suggested in 1 year. (Code:DM-B-01Y)    12/16/2017 Mammogram    IMPRESSION: No mammographic evidence of malignancy in either breast, status post bilateral mastectomies. RECOMMENDATION: Diagnostic mammogram is suggested in 1 year.      Cancer of sigmoid colon ([facility]   03/04/2014 Initial Diagnosis    Cancer of sigmoid colon    03/09/2014 Pathology Results    G1 invasive adenocarcinoma, no lymph-Vascular invasion or Peri-neural invasion: Absent. MMR normal, MSI stable.     03/09/2014 Surgery    partial colectomy, negative margins.     11/10/2014 Relapse/Recurrence  biopsy confirmed oligo liver recurrence     11/10/2014 Imaging    PET showed two small ares of hypermetabolism in the right lobe of the liver, no other distant metastasis.     11/30/2014 Imaging    abdomen MRI showed a new enhancing lesion which corresponds to an area of abnormal hyper metabolism on recent PET-CT. Stable enlarged portal caval lymph node.     01/02/2015 Pathology Results    Liver biopsy showed metastatic adenocarcinoma, IHC (+) for CK20, CDX-2, (-) for TTF-1 and ER    01/02/2015 Miscellaneous    liver biopsy  KRAS mutation (+)     02/14/2015 Imaging    CT showed:  the small metastatic right hepatic lobe liver lesion is not identified for certain on this examination. No new lesions. Stable small cysts in segment 4 a.     03/05/2015 - 04/16/2015 Chemotherapy    CAPEOX (capecitabine 2500 mg twice daily Day 1-14, oxaliplatin 1 30 mg/m on day 1, every 21 days, S/P 3 cycles     08/24/2015 Procedure    CT-guided oligo liver metastasis microwave ablation by Dr. [name]    06/17/2016 Imaging    CT of the chest/abd/pelvis with contrast 1. Several new small pulmonary nodules worrisome for metastatic disease. 2. New large area of probable infiltrating recurrent tumor in the right hepatic lobe surrounding the prior ablation site. 3. Small supraclavicular lymph nodes.  Attention on future studies. 4. Stable surgical changes involving both breasts. No breast masses are identified. 5. Stable bilateral adrenal gland nodules. 6. Stable upper abdominal and right-sided retroperitoneal lymph nodes.    07/08/2016 PET scan    1. Large mass in the right lobe of the liver demonstrates diffuse hypermetabolism, compatible with recurrent metastatic disease in the liver 2. Multiple small pulmonary nodules are very similar to the recent Chest CT 06/17/16.  3. No other findings of metastatic disease elsewhere in the neck, chest, abdomen, or pelvis. 4. There is some low-level hypermetabolic activity adjacent to the surgical clips in the lateral aspect of the right breast at the site of prior lumpectomy.     07/29/2016 Pathology Results    Liver biopsy confirmed metastatic colon cancer     07/31/2016 - 07/31/2017 Chemotherapy    Chemotherapy FOLFIRI and Avastin (from cycle 2) and Leucovorin (added from cycle 8) every 2 weeks, started on 07/31/2016, multiple delay per pt's request; chemo break from 5/30-6/21/18; decreased leucovorin to 2000 mg/m2, irinotecan to 139m/m2, 5-fu 20056mm2 from 03/05/17, due to poor tolerance.  Started chemo break since 07/31/17.     10/23/2016 Imaging    CT ABDOMEN PELVIS W CONTRAST 10/24/16 IMPRESSION: 1. The numerous tiny bilateral pulmonary nodules seen on the previous imaging exams are no longer evident, suggesting response of therapy. 2. Large ill-defined irregular right hepatic lesion measures smaller on today's study. 3. Slight increase in size of hepatoduodenal ligament lymphadenopathy. 4. Abdominal Aortic Atherosclerois (ICD10-170.0)    01/05/2017 Imaging    CT CAP IMPRESSION: 1. Left lower lobe pulmonary nodule and hepatic metastatic disease are stable. 2. Hepatoduodenal ligament lymph node is stable. 3. Aortic atherosclerosis (ICD10-170.0). Coronary artery calcification. 4. Enlarged pulmonary arteries, indicative of pulmonary arterial hypertension. 5. Small bilateral adrenal nodules are unchanged.    05/09/2017 Imaging    CT CAP IMPRESSION: 1. Stable appearance left lower lobe pulmonary nodule with an apparent new tiny posterior right upper lobe pulmonary nodule. 2. Dominant ill-defined right hepatic mass measures smaller on today's study. 3. Slight decrease in the hepatoduodenal ligament  lymph node. 4. Aortic Atherosclerois (ICD10-170.0)    05/09/2017 Imaging    CT CAP 05/09/17 IMPRESSION: 1. Stable appearance left lower lobe pulmonary nodule with an apparent new tiny posterior right upper lobe pulmonary nodule. Continued attention on follow-up recommended. 2. Dominant ill-defined right hepatic mass measures smaller on today's study. 3. Slight decrease in the hepatoduodenal ligament lymph node. 4.  Aortic Atherosclerois (ICD10-170.0)     08/11/2017 Imaging    CT CAP W Contrast 08/11/17 IMPRESSION: 1. Essentially stable appearance of small pulmonary nodules, hypodense mass laterally in the right hepatic lobe, and mild portacaval adenopathy. No new or progressive lesions. 2. Other imaging findings of potential clinical significance: Aortic Atherosclerosis  (ICD10-I70.0). Lower lumbar impingement due to spondylosis and degenerative disc disease. Uterine fibroids. Bilateral renal cysts. Coronary atherosclerosis. Mild mitral valve calcification. Mild cardiomegaly.      09/24/2017 - 12/04/2017 Chemotherapy    Maintenance Xeloda 2500 mg twice daily days 1 through 14 of each cycle of treatment and Avastin given every 3 weeks starting on 09/24/2017. Due to slight progression this was stopped on 12/04/17     12/02/2017 Imaging    CT CAP W Contrast 12/02/17 IMPRESSION: Chest Impression: 1. Interval increase in size of bilateral small pulmonary nodules. 2. No mediastinal adenopathy  Abdomen / Pelvis Impression: 1. Dominant lesion in the RIGHT hepatic lobe is similar; however there are 2 adjacent low-density lesions in the RIGHT hepatic lobe which are not appreciated on comparison exam and concerning recurrent hepatic metastasis. 2. Interval increase in size periaortic retroperitoneal lymph nodes consistent with recurrent metastatic adenopathy.    01/08/2018 -  Chemotherapy    Due to slight disease progression she restarted on Irinotecan plus Avastin every 2 weeks on 01/08/18. Added 5-Fu bolus and Leucovorin on 04/13/18.    04/09/2018 Imaging    04/09/2018 CT CAP W Contrast IMPRESSION: 1. Slight interval increase in size as well as development of new low-attenuation lesions within the liver. 2. Grossly unchanged bilateral pulmonary nodules. 3. Similar-appearing retroperitoneal adenopathy. 4. Small amount of gas within the endometrium of the uterus, nonspecific in etiology. Recommend clinical correlation for the possibility of infectious symptoms. Consider further evaluation with pelvic ultrasound as indicated.     06/07/2018 Imaging    06/07/2018 CT CAP IMPRESSION: Chest Impression:  1. Stable bilateral pulmonary nodules. 2. Single new pulmonary nodule in the RIGHT lower lobe with ill-defined borders. Recommend short-term follow-up CT (6-8  weeks) as this may represent a focus of pulmonary infection.  Abdomen / Pelvis Impression:  1. Stable hepatic metastasis in the RIGHT hepatic lobe. No new lesions. 2. Small periaortic retroperitoneal lymph nodes are unchanged. 3. Sigmoid anastomosis without local recurrence. 4. Mild nodular contour of liver could indicate early cirrhosis.    CURRENT TREATMENT:   1. Letrozole 2.5 mg once daily, started in September 2017 2. Irinotecan plus Avastin every 2 weeks started on 01/08/18, 5-fu bolus and leucovorin every 2 weeks added on 04/13/18    INTERIM HISTORY:   SONAL DORWART 63 y.o. female with a history of left breast cancer, colon cancer s/p laparoscopic-assisted partial colectomy (03/09/2014), PE (01/06/2014), and right breast cancer (01/2015), presents to clinic for follow up.  Today, she is here at the infusion. She is feeling better. She denies new symptoms, but has back pain that is relieved by Tylenol as needed every 8 hours. She used Tramadol before, but she reacted to it. She says that her energy level is low to the point that she cannot walk for  long distance. She gets DOE that is relieved by rest. She also noticed that she sometimes experiences sudden episodes of SOB while resting that are resolve spontaneously. Her BMs are good. She also noticed some pain at port site, but there is no redness or discharge.    MEDICAL HISTORY: Past Medical History:  Diagnosis Date  . Anxiety   . Breast CA (Normal)    radiation and surgery-lt.  . Cancer ([place])    left breast cancer x2  . Hypertension   . Liver lesion   . Personal history of chemotherapy   . Personal history of radiation therapy 2017  . Pulmonary embolism ([place])    "blood clot in lungs" -dx. 4'15 with CT Chest. On Xarelto  . Radiation 01/22/10-03/12/10   left whole breast 4500 cGy, upper aspect boosted to 6120 cGy  . Radiation 02/21/16 - 04/10/16   right breast 50.4 Gy, right breast boost 12 Gy  . Shortness of breath     sob with exertion. dx. with Pulmonary emboli 4'15 ,tx. Xarelto,Lovenox used for [employer].    ALLERGIES:  is allergic to tramadol.  MEDICATIONS:  Allergies as of 07/07/2018      Reactions   Tramadol Other (See Comments)   Dizziness      Medication List        Accurate as of 07/05/18  3:52 PM. Always use your most recent med list.          acetaminophen-codeine 300-60 MG tablet Commonly known as:  TYLENOL #4 Take 1 tablet by mouth every 8 (eight) hours as needed. for pain   amLODipine 5 MG tablet Commonly known as:  NORVASC Take 1 tablet (5 mg total) by mouth daily.   amoxicillin-clavulanate 875-125 MG tablet Commonly known as:  AUGMENTIN Take 1 tablet by mouth 2 (two) times daily.   azithromycin 500 MG tablet Commonly known as:  ZITHROMAX Take 1 tablet (500 mg total) by mouth daily.   dexamethasone 4 MG tablet Commonly known as:  DECADRON Take 1 tablet (4 mg total) by mouth daily. For 3-5 days after chemotherapy   ferrous sulfate 325 (65 FE) MG tablet Take 325 mg by mouth daily with breakfast. Pt takes  4 times per week.   hydrochlorothiazide 25 MG tablet Commonly known as:  HYDRODIURIL TAKE 1 TABLET BY MOUTH EVERY IN THE MORNING   letrozole 2.5 MG tablet Commonly known as:  FEMARA TAKE 1 TABLET BY MOUTH EVERY DAY   lidocaine-prilocaine cream Commonly known as:  EMLA Apply 1 application topically as needed.   LORazepam 0.5 MG tablet Commonly known as:  ATIVAN Take 1 tablet (0.5 mg total) by mouth every 8 (eight) hours.   magic mouthwash w/lidocaine Soln Take 5 mLs by mouth 4 (four) times daily as needed for mouth pain.   metoprolol tartrate 50 MG tablet Commonly known as:  LOPRESSOR Take 1 tablet (50 mg total) by mouth 2 (two) times daily.   metoprolol tartrate 50 MG tablet Commonly known as:  LOPRESSOR TAKE 1 TABLET BY MOUTH 2 TIMES DAILY   ondansetron 8 MG tablet Commonly known as:  ZOFRAN Take 1 tablet (8 mg total) by mouth 2 (two) times  daily as needed for refractory nausea / vomiting. Start on day 3 after chemotherapy.   potassium chloride SA 20 MEQ tablet Commonly known as:  K-DUR,KLOR-CON TAKE 1 TABLET BY MOUTH 2 TIMES DAILY   prochlorperazine 10 MG tablet Commonly known as:  COMPAZINE Take 1 tablet (10 mg total) by mouth every  6 (six) hours as needed (NAUSEA).   promethazine 25 MG tablet Commonly known as:  PHENERGAN Take 1 tablet (25 mg total) by mouth every 6 (six) hours as needed for nausea or vomiting.   XARELTO 20 MG Tabs tablet Generic drug:  rivaroxaban TAKE 1 TABLET BY MOUTH EVERY DAY   zolpidem 5 MG tablet Commonly known as:  AMBIEN Take 1 tablet (5 mg total) by mouth at bedtime as needed for sleep.       SURGICAL HISTORY:  Past Surgical History:  Procedure Laterality Date  . BREAST BIOPSY Left 2010   malignant  . BREAST BIOPSY Right 01/2015   malignant  . BREAST LUMPECTOMY Left 2006  . BREAST LUMPECTOMY Left 2010  . BREAST LUMPECTOMY Right 11/07/2015  . BREAST LUMPECTOMY WITH RADIOACTIVE SEED AND SENTINEL LYMPH NODE BIOPSY Right 11/07/2015   Procedure: BREAST LUMPECTOMY WITH RADIOACTIVE SEED AND SENTINEL LYMPH NODE BIOPSY;  Surgeon: Stark Klein, MD;  Location: [place];  Service: General;  Laterality: Right;  . BREAST SURGERY     '11- Left breast lumpectomy  . CESAREAN SECTION    . CHOLECYSTECTOMY     Laparoscopic 20 yrs ago  . COLON SURGERY  03-09-14   partial colectomy for cancer  . IR GENERIC HISTORICAL  07/29/2016   IR FLUORO GUIDE PORT INSERTION RIGHT 07/29/2016 Sandi Mariscal, MD [facility]  . IR GENERIC HISTORICAL  07/29/2016   IR US GUIDE VASC ACCESS RIGHT 07/29/2016 Sandi Mariscal, MD [facility]  . IR GENERIC HISTORICAL  07/29/2016   IR US GUIDE BX ASP/DRAIN 07/29/2016 [facility]  . PARTIAL COLECTOMY N/A 03/09/2014   Procedure: LAPAROSCOPIC ASSISTED PARTIAL COLECTOMY, splenic flexure mobilization;  Surgeon: Leighton Ruff, MD;  Location: [facility];  Service:  General;  Laterality: N/A;  . RADIOFREQUENCY ABLATION Right 08/24/2015   Procedure: MICROWAVE ABLATION RIGHT LIVER LOBE ;  Surgeon: Corrie Mckusick, DO;  Location: [facility];  Service: Anesthesiology;  Laterality: Right;    REVIEW OF SYSTEMS:  Constitutional: Denies fevers, chills or abnormal weight loss  (+) hair loss  Eyes: Denies blurriness of vision Ears, nose, mouth, throat, and face: Denies mucositis, no ulcers  Respiratory: Denies cough, or wheezes (+) DOE and episodes of SOB Cardiovascular: Denies palpitation, chest discomfort. No new LL edema  Gastrointestinal:  Denies heartburn or change in bowel habits.  Skin: Denies abnormal skin rashes MSK: No myalgia, or joint pain  Lymphatics: Denies new lymphadenopathy or easy bruising Neurological:Denies numbness, tingling or new weaknesses   Behavioral/Psych: Mood is stable, no new changes  All other systems were reviewed with the patient and are negative.    PHYSICAL EXAMINATION:   ECOG PERFORMANCE STATUS: 1 Vitals: BP 133/82 Pulse 79 RR 18 GENERAL:alert, no distress and comfortable; well developed and obese. Easily mobile to exam table. (+) hair loss SKIN: skin color, texture, turgor are normal, no rashes or significant lesions EYES: normal, Conjunctiva are pink and non-injected, sclera clear OROPHARYNX:no exudate, no erythema and lips, buccal mucosa, and tongue normal  NECK: supple, thyroid normal size, non-tender, without nodularity LYMPH:  no palpable lymphadenopathy in the cervical, axillary or supraclavicular LUNGS: clear to auscultation with normal breathing effort, no wheezes or rhonchi  HEART: regular rate & rhythm and no murmurs  ABDOMEN:abdomen soft, non-tender and normal bowel sounds; incision healing with signs of infection.  Mild hepatomegaly with tenderness at the RUQ, no abdominal tenderness. Previous lleft sided abdominal boil has resolved  Musculoskeletal:no cyanosis of digits and no clubbing, limited ROM of  left  shoulder, no myalgia or other joint pain, leg pain improved NEURO: alert & oriented x 3 with fluent speech, no focal motor/sensory deficits   Labs:  CBC Latest Ref Rng & Units 07/07/2018 06/08/2018 05/26/2018  WBC 4.0 - 10.5 K/uL 10.7(H) 9.1 7.9  Hemoglobin 12.0 - 15.0 g/dL 13.5 12.8 13.0  Hematocrit 36.0 - 46.0 % 41.1 39.1 40.0  Platelets 150 - 400 K/uL 219 253 214    CMP Latest Ref Rng & Units 07/07/2018 06/08/2018 05/26/2018  Glucose 70 - 99 mg/dL 140(H) 118(H) 140(H)  BUN 8 - 23 mg/dL 10 7(L) 8  Creatinine 0.44 - 1.00 mg/dL 0.85 0.82 0.98  Sodium 135 - 145 mmol/L 142 141 141  Potassium 3.5 - 5.1 mmol/L 3.6 3.7 3.6  Chloride 98 - 111 mmol/L 104 104 104  CO2 22 - 32 mmol/L '23 26 22  ' Calcium 8.9 - 10.3 mg/dL 10.2 10.2 9.9  Total Protein 6.5 - 8.1 g/dL 8.1 7.8 7.6  Total Bilirubin 0.3 - 1.2 mg/dL 0.4 0.5 0.5  Alkaline Phos 38 - 126 U/L 79 58 57  AST 15 - 41 U/L 54(H) 30 27  ALT 0 - 44 U/L 42 24 20    CEA  01/18/2014: 0.8 03/01/2015: 1.3 01/31/2016: 4.8 05/28/2016: 48 07/31/2016: 173.7 08/28/16: 139 09/25/2016: 43 11/04/16: 9.94 12/18/2016: 6.99 01/29/17: 4.58 03/04/17: 6.09 04/09/17: 4.22 05/07/17: 5.29 06/04/17: 4.12 07/02/17: 6.78 07/30/17 : 5.35  09/24/17: 5.41 10/23/17: 8.03 11/13/17: 12.32 01/01/2018: 27.70 02/05/2018: 39.93 03/12/18: 36.22  04/13/2018: 38.65 05/11/18: 37.13 06/08/2018: 37.38  PATHOLOGY RESULT Diagnosis 01/02/2015 Liver, needle/core biopsy, right - POSITIVE FOR METASTATIC ADENOCARCINOMA. - SEE COMMENT.  Diagnosis 11/07/2015 1. Breast, lumpectomy, Right - INVASIVE DUCTAL CARCINOMA, GRADE 2, SPANNING 1 CM. - DUCTAL CARCINOMA IN SITU, LOW GRADE. - RESECTION MARGINS ARE NEGATIVE FOR INVASIVE CARCINOMA. - DUCTAL CARCINOMA IN SITU COMES TO WITHIN 0.3 CM OF THE POSTERIOR MARGIN. - BIOPSY SITE. - SEE ONCOLOGY TABLE. 2. Lymph node, sentinel, biopsy, right axillary #1 - ONE OF ONE LYMPH NODES NEGATIVE FOR CARCINOMA (0/1). 3. Lymph node, sentinel, biopsy, right  axillary #2 - ONE OF ONE LYMPH NODES NEGATIVE FOR CARCINOMA (0/1). 4. Lymph node, sentinel, biopsy, right axillary #3 - ONE OF ONE LYMPH NODES NEGATIVE FOR CARCINOMA (0/1).  ONCOTYPE DX: RS 10, which predicts a year risk of distant recurrence with tamoxifen alone 14%   Diagnosis 07/29/2016 Liver, needle/core biopsy, posterior of right lobe METASTATIC ADENOCARCINOMA, CONSISTENT WITH COLONIC PRIMARY.  RADIOLOGY STUDIES   06/07/2018 CT CAP IMPRESSION: Chest Impression:  1. Stable bilateral pulmonary nodules. 2. Single new pulmonary nodule in the RIGHT lower lobe with ill-defined borders. Recommend short-term follow-up CT (6-8 weeks) as this may represent a focus of pulmonary infection.  Abdomen / Pelvis Impression:  1. Stable hepatic metastasis in the RIGHT hepatic lobe. No new lesions. 2. Small periaortic retroperitoneal lymph nodes are unchanged. 3. Sigmoid anastomosis without local recurrence. 4. Mild nodular contour of liver could indicate early cirrhosis.  04/09/2018 CT CAP W Contrast IMPRESSION: 1. Slight interval increase in size as well as development of new low-attenuation lesions within the liver. 2. Grossly unchanged bilateral pulmonary nodules. 3. Similar-appearing retroperitoneal adenopathy. 4. Small amount of gas within the endometrium of the uterus, nonspecific in etiology. Recommend clinical correlation for the possibility of infectious symptoms. Consider further evaluation with pelvic ultrasound as indicated.  12/16/2017 Diagnostic Mammogram IMPRESSION: No mammographic evidence of malignancy in either breast, status post bilateral mastectomies.  12/03/2017 CT CAP W Contrast IMPRESSION: Chest Impression:  1. Interval increase in size of bilateral small pulmonary nodules. 2. No mediastinal adenopathy  Abdomen / Pelvis Impression:  1. Dominant lesion in the RIGHT hepatic lobe is similar; however there are 2 adjacent low-density lesions in the RIGHT  hepatic lobe which are not appreciated on comparison exam and concerning recurrent hepatic metastasis. 2. Interval increase in size periaortic retroperitoneal lymph nodes consistent with recurrent metastatic adenopathy   PET 07/08/2016 IMPRESSION: 1. Large mass in the right lobe of the liver demonstrates diffuse hypermetabolism, compatible with recurrent metastatic disease in the liver. 2. Multiple small pulmonary nodules are very similar to the recent chest CT 06/17/2016. These do not demonstrate hypermetabolism on the PET images, however, these lesions are well below the level of PET imaging. Given that these nodules are new compared to prior chest CT 12/18/2015, they are concerning for potential metastatic disease to the lungs, and continued attention on followup studies is recommended. 3. No other findings of metastatic disease elsewhere in the neck, chest, abdomen or pelvis. 4. There is some low-level hypermetabolic activity adjacent to the surgical clips in the lateral aspect of the right breast at the site of prior lumpectomy. This area has undergone interval involution over the several prior examinations, and this low-level activity is favored to be related to normal healing. Attention on follow-up imaging is recommended to ensure the stability or resolution of this finding. 5. Aortic atherosclerosis, in addition to 2 vessel coronary artery disease. Please note that although the presence of coronary artery calcium documents the presence of coronary artery disease, the severity of this disease and any potential stenosis cannot be assessed on this non-gated CT examination. Assessment for potential risk factor modification, dietary therapy or pharmacologic therapy may be warranted, if clinically indicated. 6. Additional incidental findings, as above.  MM DIAG BREAST TOMO BILATERAL 10/17/2016 FINDINGS: There are bilateral lumpectomy changes in the upper central left breast and upper  central right breast. No mass, nonsurgical distortion, or suspicious microcalcification is identified in either breast to suggest malignancy. Mammographic images were processed with CAD. IMPRESSION: No evidence of malignancy in either breast. Lumpectomy changes bilaterally. RECOMMENDATION: Diagnostic mammogram is suggested in 1 year. (Code:DM-B-01Y)  CT CHEST, ABDOMEN PELVIS W CONTRAST 10/24/16 IMPRESSION: 1. The numerous tiny bilateral pulmonary nodules seen on the previous imaging exams are no longer evident, suggesting response of therapy. 2. Large ill-defined irregular right hepatic lesion measures smaller on today's study. 3. Slight increase in size of hepatoduodenal ligament lymphadenopathy. 4. Abdominal Aortic Atherosclerois (ICD10-170.0)  CT CAP 01/05/17 IMPRESSION: 1. Left lower lobe pulmonary nodule and hepatic metastatic disease are stable. 2. Hepatoduodenal ligament lymph node is stable. 3. Aortic atherosclerosis (ICD10-170.0). Coronary artery calcification. 4. Enlarged pulmonary arteries, indicative of pulmonary arterial hypertension. 5. Small bilateral adrenal nodules are unchanged.  CT CAP 05/09/17 IMPRESSION: 1. Stable appearance left lower lobe pulmonary nodule with an apparent new tiny posterior right upper lobe pulmonary nodule. Continued attention on follow-up recommended. 2. Dominant ill-defined right hepatic mass measures smaller on today's study. 3. Slight decrease in the hepatoduodenal ligament lymph node. 4.  Aortic Atherosclerois (ICD10-170.0)   CT CAP W Contrast 08/11/17 IMPRESSION: 1. Essentially stable appearance of small pulmonary nodules, hypodense mass laterally in the right hepatic lobe, and mild portacaval adenopathy. No new or progressive lesions. 2. Other imaging findings of potential clinical significance: Aortic Atherosclerosis (ICD10-I70.0). Lower lumbar impingement due to spondylosis and degenerative disc disease. Uterine  fibroids. Bilateral renal cysts. Coronary atherosclerosis. Mild mitral valve calcification. Mild cardiomegaly.   CT  CAP W Contrast 12/02/17 IMPRESSION: Chest Impression: 1. Interval increase in size of bilateral small pulmonary nodules. 2. No mediastinal adenopathy  Abdomen / Pelvis Impression: 1. Dominant lesion in the RIGHT hepatic lobe is similar; however there are 2 adjacent low-density lesions in the RIGHT hepatic lobe which are not appreciated on comparison exam and concerning recurrent hepatic metastasis. 2. Interval increase in size periaortic retroperitoneal lymph nodes consistent with recurrent metastatic adenopathy.  MM DIAG BREAST, 12/16/2017 IMPRESSION: No mammographic evidence of malignancy in either breast, status post bilateral mastectomies.  ASSESSMENT:   Veronica Reyes 62 y.o. female   1. Colon Cancer, Stage I (pT2N0), MMR normal, oligo recurrent disease in liver in 12/2014, KRAS mutation (+), liver and lung recurrence in 06/2016 -I previously reviewed the nature history of metastatic colon cancer, and the potential curative approach with chemotherapy followed by liver lesion ablation or resection. However, more likely, this is not curable disease. -She initially received 3 cycles of Capox, did not want to proceed with fourth cycle due to the moderate side effects from previous chemotherapy treatment. She declined liver mass resection, and underwent liver ablation by interventional radiologist Dr. Earleen Newport in 08/2015. -I previously reviewed her restaging CT scan from 06/17/2016, which unfortunately showed new area of infiltrative disease as the previous ablated liver lesion, likely recurrence. She also developed several new pulmonary nodules, small, concerning for metastatic disease. 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 -She started chemotherapy FOLFIRI and Avastin on 08/13/16. Inially she  tolerated well but after many cycles she did develop side effects. I previously had to decrease her chemo dose a few times or pt requested a break several times for multiple reasons. Last FOLFIRI chemo was on 07/30/17 (cycle 18). -She did have a good response to chemo, her CEA significantly decreased. CT scan from 05/09/2017 showed continuous response to treatment, her liver metastasis slightly decreased in size.  -After CT CAP from 08/11/17 which showed stable controlled disease, I was able to switch her to maintenance therapy. She started Xeloda with Avastin every 3 weeks on 09/24/17.  -CT CAP W Contrast from 12/02/17 reveals interval increase in size of bilateral small pulmonary nodules and two new lesions in the right hepatic lobe, although her previous liver metastasis in the right lobe has decreased in size.  -Her treatment was subsequently changed back to irinotecan and avastin. Pt declined 5-fu pump on 01/08/18 -She was not very compliant with treatment, due to her trips, her husband's surgery etc. - A 04/09/2018 CT CAP W Contrast showed Slight interval increase in size as well as development of new metastatic lesions within the liver, Grossly unchanged bilateral pulmonary nodules, and Similar-appearing retroperitoneal adenopathy. I reviewed with pt in detail. This scan was compared to her scan 4 months previous, however she only received 1 cycle of urine taken in the first 2 months after previous scan, some of her disease progression is probably related to lack of consistent treatment. -Due to the disease progression, I previously recommended her to add a 5-FU pump infusion back. However patient still declined. After lengthy discussion, she is agreeable to add 5-FU bolus and leucovorin with each irinotecan treatment on 04/13/18.  -She has tolerated the addition of 5-FU bolus and leucovorin well and her mucositis has much improved -I reviewed her restaging CT scan from 06/07/2018 , which showed stable  disease, no new lesions except a 13 mm lung nodule which is indeterminate, will f/u closely. -Labs reviewed, CBC  and CMP are overall adequate for treatment.. Her abdominal wall abscess has resolved. Will proceed with treatment irinotecan, Avastin, 5-FU bolus and leucovorin today and continue every 2 weeks. -She is clinically doing well, she has back pain that is controlled with Tylenol #4 -Restaging CT scan in December. -Continue chemo and follow-up every 2 weeks  2. Right breast cancer, pT1bN0M0, stage Ia, ER+/PR+/HER2-, History of left Breast Cancer, diagnosed on 02/01/2015 -Left CIS in situ, s/p lumpectomy in 2006 and 2011 by Dr. Margot Chimes, and radiation in 2011 by Dr. Sondra Come.  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 Surgical margins were negative. -I previously reviewed her Oncotype DX result. Her recurrence score is 22, which predicts 14% 10 year risk of distant recurrence with tamoxifen alone. This is intermediate risk, based on her relatively low number, I do not recommend adjuvant chemotherapy. -She has completed adjuvant breast radiation with Dr. Sondra Come in July 2017.  -her [facility] level supported she is postmenopausal -She started adjuvant letrozole in 05/2016, tolerating well, we'll continue for a total of 5-7 years  -We previously discussed breast cancer surveillance, I strongly encouraged her to continue annual screening mammogram, self exam, and follow-up with [place] routinely. -diagnostic mammogram on 12/16/2017 showed: No mammographic evidence of malignancy in either breast, status post bilateral mastectomies.   3. History of PE (01/06/2014) -She has a diagnosis of pulmonary embolism, likely secondary to malignancy in the setting of family history for stroke (and possible stroke history in patient as well). Lower extremity dopplers negative.  -Continue Xarelto. Due to her metastatic colon cancer, I  previously recommended her to continue indefinitely.  4.  Uterine Fibroids  --Negative endometrial biopsy. Following with Gynecology.  5. Kidney lesion (Right) -No hypermetabolic right kidney lesion on the pet scan -Repeat abdominal MRI 05/29/2015 showed unchanged right renal lesion, favored to represent a complex/septate cyst.  6. Right flank pain, left Knee discomfort  -possibly related to her liver metastasis -I previously suggested she see a orthopedics for her knee issues.  -Continue Tylenol No. 4 as needed for pain. Her abdominal pain overall has improved, controlled.  -Her left knee swelling has improved, pain is stable    7.  Morbid obesity - I have previously encouraged her to eat healthy and exercise more.  8. HTN  -We previously discussed that Avastin can increase her blood pressure, I recommend her to continue monitoring at home -She was mildly hypertensive on 07/16/17, we will increase her dose of Amlodipine to 2m if needed in the future.  -Continue Amlodipine, lopressor and HCTZ  -BP stable   9. Hypokalemia  -Given intermittent diarrhea she will continue oral K supplement for now at 1 tab BID.  -Continue oral K BID  11. Left Abdominal wall Abscess -she has developed a boil on LUQ abdominal wall, 2x2.5 cm without discharge -I prescribed augmentin and advised her to monitor -If it didn't improve, I recommend I&D because she is on chemotherapy.    Plan  -will proceed with 5-fu bolus, Irinotecan and Avastin today and continue every 2 weeks -I will refill Tylenol #4 -repeat scan in December -f/u in 2 weeks   All questions were answered. The patient knows to call the clinic with any problems, questions or concerns. We can certainly see the patient much sooner if necessary.  I spent 20 minutes counseling the patient face to face. The total time spent in the appointment was 25 minutes.  Dierdre Searles Dweik am acting as scribe for Dr. [name].  I have reviewed the  above documentation for accuracy and completeness, and I agree with the above.    [name]  07/05/2018

## 2018-07-07 ENCOUNTER — Inpatient Hospital Stay: Payer: BLUE CROSS/BLUE SHIELD

## 2018-07-07 ENCOUNTER — Inpatient Hospital Stay: Payer: BLUE CROSS/BLUE SHIELD | Attending: Hematology

## 2018-07-07 ENCOUNTER — Other Ambulatory Visit: Payer: Self-pay | Admitting: Nurse Practitioner

## 2018-07-07 ENCOUNTER — Inpatient Hospital Stay (HOSPITAL_BASED_OUTPATIENT_CLINIC_OR_DEPARTMENT_OTHER): Payer: BLUE CROSS/BLUE SHIELD | Admitting: Hematology

## 2018-07-07 VITALS — BP 130/75 | HR 79 | Temp 98.4°F | Resp 18

## 2018-07-07 DIAGNOSIS — C787 Secondary malignant neoplasm of liver and intrahepatic bile duct: Secondary | ICD-10-CM | POA: Insufficient documentation

## 2018-07-07 DIAGNOSIS — Z5111 Encounter for antineoplastic chemotherapy: Secondary | ICD-10-CM | POA: Diagnosis present

## 2018-07-07 DIAGNOSIS — C7800 Secondary malignant neoplasm of unspecified lung: Secondary | ICD-10-CM

## 2018-07-07 DIAGNOSIS — C187 Malignant neoplasm of sigmoid colon: Secondary | ICD-10-CM

## 2018-07-07 DIAGNOSIS — Z170 Estrogen receptor positive status [ER+]: Secondary | ICD-10-CM

## 2018-07-07 DIAGNOSIS — Z5112 Encounter for antineoplastic immunotherapy: Secondary | ICD-10-CM | POA: Insufficient documentation

## 2018-07-07 DIAGNOSIS — C50511 Malignant neoplasm of lower-outer quadrant of right female breast: Secondary | ICD-10-CM

## 2018-07-07 DIAGNOSIS — I10 Essential (primary) hypertension: Secondary | ICD-10-CM

## 2018-07-07 DIAGNOSIS — Z86718 Personal history of other venous thrombosis and embolism: Secondary | ICD-10-CM

## 2018-07-07 DIAGNOSIS — Z7901 Long term (current) use of anticoagulants: Secondary | ICD-10-CM

## 2018-07-07 DIAGNOSIS — M545 Low back pain: Secondary | ICD-10-CM

## 2018-07-07 DIAGNOSIS — E876 Hypokalemia: Secondary | ICD-10-CM

## 2018-07-07 DIAGNOSIS — Z86711 Personal history of pulmonary embolism: Secondary | ICD-10-CM

## 2018-07-07 LAB — CMP (CANCER CENTER ONLY)
ALBUMIN: 3.2 g/dL — AB (ref 3.5–5.0)
ALK PHOS: 79 U/L (ref 38–126)
ALT: 42 U/L (ref 0–44)
ANION GAP: 15 (ref 5–15)
AST: 54 U/L — ABNORMAL HIGH (ref 15–41)
BUN: 10 mg/dL (ref 8–23)
CHLORIDE: 104 mmol/L (ref 98–111)
CO2: 23 mmol/L (ref 22–32)
Calcium: 10.2 mg/dL (ref 8.9–10.3)
Creatinine: 0.85 mg/dL (ref 0.44–1.00)
GFR, Est AFR Am: >60 mL/min (ref >60)
GFR, Estimated: >60 mL/min (ref >60)
Glucose, Bld: 140 mg/dL — ABNORMAL HIGH (ref 70–99)
POTASSIUM: 3.6 mmol/L (ref 3.5–5.1)
SODIUM: 142 mmol/L (ref 135–145)
TOTAL PROTEIN: 8.1 g/dL (ref 6.5–8.1)
Total Bilirubin: 0.4 mg/dL (ref 0.3–1.2)

## 2018-07-07 LAB — CBC WITH DIFFERENTIAL (CANCER CENTER ONLY)
ABS IMMATURE GRANULOCYTES: 0.03 10*3/uL (ref 0.00–0.07)
BASOS ABS: 0.1 10*3/uL (ref 0.0–0.1)
BASOS PCT: 1 %
Eosinophils Absolute: 0.2 10*3/uL (ref 0.0–0.5)
Eosinophils Relative: 2 %
HCT: 41.1 % (ref 36.0–46.0)
HEMOGLOBIN: 13.5 g/dL (ref 12.0–15.0)
Immature Granulocytes: 0 %
LYMPHS PCT: 30 %
Lymphs Abs: 3.2 10*3/uL (ref 0.7–4.0)
MCH: 31.3 pg (ref 26.0–34.0)
MCHC: 32.8 g/dL (ref 30.0–36.0)
MCV: 95.1 fL (ref 80.0–100.0)
Monocytes Absolute: 0.8 10*3/uL (ref 0.1–1.0)
Monocytes Relative: 7 %
NEUTROS ABS: 6.5 10*3/uL (ref 1.7–7.7)
NEUTROS PCT: 60 %
NRBC: 0 % (ref 0.0–0.2)
PLATELETS: 219 10*3/uL (ref 150–400)
RBC: 4.32 MIL/uL (ref 3.87–5.11)
RDW: 17.2 % — ABNORMAL HIGH (ref 11.5–15.5)
WBC: 10.7 10*3/uL — AB (ref 4.0–10.5)

## 2018-07-07 LAB — TOTAL PROTEIN, URINE DIPSTICK: Protein, ur: NEGATIVE mg/dL

## 2018-07-07 LAB — CEA (IN HOUSE-CHCC): CEA (CHCC-In House): 48.73 ng/mL — ABNORMAL HIGH (ref 0.00–5.00)

## 2018-07-07 MED ORDER — PROCHLORPERAZINE MALEATE 10 MG PO TABS
ORAL_TABLET | ORAL | Status: AC
  Filled 2018-07-07: qty 1

## 2018-07-07 MED ORDER — SODIUM CHLORIDE 0.9 % IV SOLN
700.0000 mg | Freq: Once | INTRAVENOUS | Status: AC
  Administered 2018-07-07: 700 mg via INTRAVENOUS
  Filled 2018-07-07: qty 16

## 2018-07-07 MED ORDER — PALONOSETRON HCL INJECTION 0.25 MG/5ML
0.2500 mg | Freq: Once | INTRAVENOUS | Status: AC
  Administered 2018-07-07: 0.25 mg via INTRAVENOUS

## 2018-07-07 MED ORDER — ATROPINE SULFATE 1 MG/ML IJ SOLN
INTRAMUSCULAR | Status: AC
  Filled 2018-07-07: qty 1

## 2018-07-07 MED ORDER — ATROPINE SULFATE 1 MG/ML IJ SOLN
0.5000 mg | Freq: Once | INTRAMUSCULAR | Status: AC | PRN
  Administered 2018-07-07: 0.5 mg via INTRAVENOUS

## 2018-07-07 MED ORDER — PROCHLORPERAZINE MALEATE 10 MG PO TABS
10.0000 mg | ORAL_TABLET | Freq: Once | ORAL | Status: AC | PRN
  Administered 2018-07-07: 10 mg via ORAL

## 2018-07-07 MED ORDER — LEUCOVORIN CALCIUM INJECTION 350 MG
400.0000 mg/m2 | Freq: Once | INTRAVENOUS | Status: AC
  Administered 2018-07-07: 976 mg via INTRAVENOUS
  Filled 2018-07-07: qty 48.8

## 2018-07-07 MED ORDER — FLUOROURACIL CHEMO INJECTION 2.5 GM/50ML
405.0000 mg/m2 | Freq: Once | INTRAVENOUS | Status: AC
  Administered 2018-07-07: 1000 mg via INTRAVENOUS
  Filled 2018-07-07: qty 20

## 2018-07-07 MED ORDER — IRINOTECAN HCL CHEMO INJECTION 100 MG/5ML
130.0000 mg/m2 | Freq: Once | INTRAVENOUS | Status: AC
  Administered 2018-07-07: 320 mg via INTRAVENOUS
  Filled 2018-07-07: qty 15

## 2018-07-07 MED ORDER — HEPARIN SOD (PORK) LOCK FLUSH 100 UNIT/ML IV SOLN
500.0000 [IU] | Freq: Once | INTRAVENOUS | Status: AC | PRN
  Administered 2018-07-07: 500 [IU]
  Filled 2018-07-07: qty 5

## 2018-07-07 MED ORDER — SODIUM CHLORIDE 0.9% FLUSH
10.0000 mL | INTRAVENOUS | Status: DC | PRN
  Administered 2018-07-07: 10 mL
  Filled 2018-07-07: qty 10

## 2018-07-07 MED ORDER — PALONOSETRON HCL INJECTION 0.25 MG/5ML
INTRAVENOUS | Status: AC
  Filled 2018-07-07: qty 5

## 2018-07-07 MED ORDER — SODIUM CHLORIDE 0.9 % IV SOLN
Freq: Once | INTRAVENOUS | Status: AC
  Administered 2018-07-07: 12:00:00 via INTRAVENOUS
  Filled 2018-07-07: qty 250

## 2018-07-07 MED ORDER — SODIUM CHLORIDE 0.9 % IV SOLN
Freq: Once | INTRAVENOUS | Status: AC
  Administered 2018-07-07: 13:00:00 via INTRAVENOUS
  Filled 2018-07-07: qty 5

## 2018-07-07 NOTE — Patient Instructions (Signed)
[facility] Discharge Instructions for Patients Receiving Chemotherapy  Today you received the following chemotherapy agents Avastin, Irinotecan, Leucovorin and Adrucil  To help prevent nausea and vomiting after your treatment, we encourage you to take your nausea medication  As directed.    If you develop nausea and vomiting that is not controlled by your nausea medication, call the clinic.   BELOW ARE SYMPTOMS THAT SHOULD BE REPORTED IMMEDIATELY:  *FEVER GREATER THAN 100.5 F  *CHILLS WITH OR WITHOUT FEVER  NAUSEA AND VOMITING THAT IS NOT CONTROLLED WITH YOUR NAUSEA MEDICATION  *UNUSUAL SHORTNESS OF BREATH  *UNUSUAL BRUISING OR BLEEDING  TENDERNESS IN MOUTH AND THROAT WITH OR WITHOUT PRESENCE OF ULCERS  *URINARY PROBLEMS  *BOWEL PROBLEMS  UNUSUAL RASH Items with * indicate a potential emergency and should be followed up as soon as possible.  Feel free to call the clinic should you have any questions or concerns. The clinic phone number is [phone].  Please show the [facility] [place] at check-in to the Emergency Department and triage nurse.

## 2018-07-07 NOTE — Patient Instructions (Signed)

## 2018-07-08 ENCOUNTER — Encounter: Payer: Self-pay | Admitting: Hematology

## 2018-07-08 MED ORDER — ACETAMINOPHEN-CODEINE #4 300-60 MG PO TABS: 1.0000 | ORAL_TABLET | Freq: Three times a day (TID) | ORAL | 0 refills | Status: DC | PRN

## 2018-07-09 ENCOUNTER — Telehealth: Payer: Self-pay | Admitting: Hematology

## 2018-07-09 NOTE — Telephone Encounter (Signed)
No los 10/23

## 2018-07-15 ENCOUNTER — Other Ambulatory Visit: Payer: Self-pay | Admitting: Nurse Practitioner

## 2018-07-15 DIAGNOSIS — I10 Essential (primary) hypertension: Secondary | ICD-10-CM

## 2018-07-20 NOTE — Progress Notes (Signed)
[place]  [phone] [phone]  Clinic Follow up Note   Patient Care Team: [name], MD as PCP - General (Internal Medicine) Leighton Ruff, MD as Consulting Physician (General Surgery) Carol Ada, MD as Consulting Physician (Gastroenterology) Concha Norway, MD (Inactive) as Consulting Physician (Internal Medicine) Emily Filbert, MD as Consulting Physician (Obstetrics and Gynecology) Tania Ade, RN as Registered Nurse (Medical Oncology) Stark Klein, MD as Consulting Physician (General Surgery) [name], MD as Consulting Physician (Hematology) 07/21/2018  SUMMARY OF ONCOLOGIC HISTORY: Oncology History   Breast cancer of lower-outer quadrant of right female breast [facility])   Staging form: Breast, AJCC 7th Edition     Clinical stage from 02/01/2015: Stage IA (T1a, N0, M0) - Signed by [name], MD on 06/04/2015     Pathologic stage from 11/10/2015: Stage IA (T1b, N0, M0) - Signed by [name], MD on 12/08/2015 Cancer of sigmoid colon [facility])   Staging form: Colon and Rectum, AJCC 7th Edition     Clinical: Stage I (T2, N0, M0) - Unsigned     Breast cancer of lower-outer quadrant of right female breast ([place])   2006 Cancer Diagnosis    Left breast DCIS diagnosed in 2006 and 2010, status post lumpectomy.    01/07/2014 Initial Diagnosis    Breast cancer    01/18/2015 Mammogram    54m mass in lower outer quadrant of right breast     02/01/2015 Initial Biopsy    (R) breast mass biopsy showed invasive ductal carcinoma & DCIS, grade 1-2.     02/01/2015 Receptors her2    ER 90%+, PR 10%+, Ki67 10%, HER2 negative (ratio 1.09)     11/07/2015 Surgery    (R) breast lumpectomy and sentinel lymph node biopsy ([name]    11/07/2015 Pathology Results    (R) breast lumpectomy showed invasive ductal carcinoma, grade 2, 1 cm, low-grade DCIS, negative margins, 3 sentinel lymph nodes were negative. LVI (-). HER2 repeated and remains negative (ratio  1.29).      11/07/2015 Pathologic Stage    pT1b,pN0: Stage IA     11/07/2015 Oncotype testing    RS 22, which predicts a year risk of distant recurrence with tamoxifen alone 14% (intermediate-risk). Adjuvant chemo was not offered    02/21/2016 - 04/10/2016 Radiation Therapy    Adjuvant breast radiation. ([name]). Right breast: 50.4 Gy in 28 fractions.  Right breast boost: 12 Gy in 6 fractions.    05/2016 -  Anti-estrogen oral therapy    Femara (Letrozole) 2.5 mg daily.     10/17/2016 Mammogram    MM DIAG BREAST TOMO BILATERAL 10/17/2016 FINDINGS: There are bilateral lumpectomy changes in the upper central left breast and upper central right breast. No mass, nonsurgical distortion, or suspicious microcalcification is identified in either breast to suggest malignancy. Mammographic images were processed with CAD. IMPRESSION: No evidence of malignancy in either breast. Lumpectomy changes bilaterally. RECOMMENDATION: Diagnostic mammogram is suggested in 1 year. (Code:[id number])    12/16/2017 Mammogram    IMPRESSION: No mammographic evidence of malignancy in either breast, status post bilateral mastectomies. RECOMMENDATION: Diagnostic mammogram is suggested in 1 year.      [place]   03/04/2014 Initial Diagnosis    Cancer of sigmoid colon    03/09/2014 Pathology Results    G1 invasive adenocarcinoma, no lymph-Vascular invasion or Peri-neural invasion: Absent. MMR normal, MSI stable.     03/09/2014 Surgery    partial colectomy, negative margins.  11/10/2014 Relapse/Recurrence    biopsy confirmed oligo liver recurrence     11/10/2014 Imaging    PET showed two small ares of hypermetabolism in the right lobe of the liver, no other distant metastasis.     11/30/2014 Imaging    abdomen MRI showed a new enhancing lesion which corresponds to an area of abnormal hyper metabolism on recent PET-CT. Stable enlarged portal caval lymph node.     01/02/2015 Pathology Results     Liver biopsy showed metastatic adenocarcinoma, IHC (+) for CK20, CDX-2, (-) for TTF-1 and ER    01/02/2015 Miscellaneous    liver biopsy KRAS mutation (+)     02/14/2015 Imaging    CT showed:  the small metastatic right hepatic lobe liver lesion is not identified for certain on this examination. No new lesions. Stable small cysts in segment 4 a.     03/05/2015 - 04/16/2015 Chemotherapy    CAPEOX (capecitabine 2500 mg twice daily Day 1-14, oxaliplatin 1 30 mg/m on day 1, every 21 days, S/P 3 cycles     08/24/2015 Procedure    CT-guided oligo liver metastasis microwave ablation by Dr. [name]    06/17/2016 Imaging    CT of the chest/abd/pelvis with contrast 1. Several new small pulmonary nodules worrisome for metastatic disease. 2. New large area of probable infiltrating recurrent tumor in the right hepatic lobe surrounding the prior ablation site. 3. Small supraclavicular lymph nodes.  Attention on future studies. 4. Stable surgical changes involving both breasts. No breast masses are identified. 5. Stable bilateral adrenal gland nodules. 6. Stable upper abdominal and right-sided retroperitoneal lymph nodes.    07/08/2016 PET scan    1. Large mass in the right lobe of the liver demonstrates diffuse hypermetabolism, compatible with recurrent metastatic disease in the liver 2. Multiple small pulmonary nodules are very similar to the recent Chest CT 06/17/16.  3. No other findings of metastatic disease elsewhere in the neck, chest, abdomen, or pelvis. 4. There is some low-level hypermetabolic activity adjacent to the surgical clips in the lateral aspect of the right breast at the site of prior lumpectomy.     07/29/2016 Pathology Results    Liver biopsy confirmed metastatic colon cancer     07/31/2016 - 07/31/2017 Chemotherapy    Chemotherapy FOLFIRI and Avastin (from cycle 2) and Leucovorin (added from cycle 8) every 2 weeks, started on 07/31/2016, multiple delay per pt's request; chemo  break from 5/30-6/21/18; decreased leucovorin to 2000 mg/m2, irinotecan to 164m/m2, 5-fu 20046mm2 from 03/05/17, due to poor tolerance. Started chemo break since 07/31/17.     10/23/2016 Imaging    CT ABDOMEN PELVIS W CONTRAST 10/24/16 IMPRESSION: 1. The numerous tiny bilateral pulmonary nodules seen on the previous imaging exams are no longer evident, suggesting response of therapy. 2. Large ill-defined irregular right hepatic lesion measures smaller on today's study. 3. Slight increase in size of hepatoduodenal ligament lymphadenopathy. 4. Abdominal Aortic Atherosclerois (ICD10-170.0)    01/05/2017 Imaging    CT CAP IMPRESSION: 1. Left lower lobe pulmonary nodule and hepatic metastatic disease are stable. 2. Hepatoduodenal ligament lymph node is stable. 3. Aortic atherosclerosis (ICD10-170.0). Coronary artery calcification. 4. Enlarged pulmonary arteries, indicative of pulmonary arterial hypertension. 5. Small bilateral adrenal nodules are unchanged.    05/09/2017 Imaging    CT CAP IMPRESSION: 1. Stable appearance left lower lobe pulmonary nodule with an apparent new tiny posterior right upper lobe pulmonary nodule. 2. Dominant ill-defined right hepatic mass measures smaller on today's study. 3. Slight  decrease in the hepatoduodenal ligament lymph node. 4. Aortic Atherosclerois (ICD10-170.0)    05/09/2017 Imaging    CT CAP 05/09/17 IMPRESSION: 1. Stable appearance left lower lobe pulmonary nodule with an apparent new tiny posterior right upper lobe pulmonary nodule. Continued attention on follow-up recommended. 2. Dominant ill-defined right hepatic mass measures smaller on today's study. 3. Slight decrease in the hepatoduodenal ligament lymph node. 4.  Aortic Atherosclerois (ICD10-170.0)     08/11/2017 Imaging    CT CAP W Contrast 08/11/17 IMPRESSION: 1. Essentially stable appearance of small pulmonary nodules, hypodense mass laterally in the right hepatic lobe, and  mild portacaval adenopathy. No new or progressive lesions. 2. Other imaging findings of potential clinical significance: Aortic Atherosclerosis (ICD10-I70.0). Lower lumbar impingement due to spondylosis and degenerative disc disease. Uterine fibroids. Bilateral renal cysts. Coronary atherosclerosis. Mild mitral valve calcification. Mild cardiomegaly.      09/24/2017 - 12/04/2017 Chemotherapy    Maintenance Xeloda 2500 mg twice daily days 1 through 14 of each cycle of treatment and Avastin given every 3 weeks starting on 09/24/2017. Due to slight progression this was stopped on 12/04/17     12/02/2017 Imaging    CT CAP W Contrast 12/02/17 IMPRESSION: Chest Impression: 1. Interval increase in size of bilateral small pulmonary nodules. 2. No mediastinal adenopathy  Abdomen / Pelvis Impression: 1. Dominant lesion in the RIGHT hepatic lobe is similar; however there are 2 adjacent low-density lesions in the RIGHT hepatic lobe which are not appreciated on comparison exam and concerning recurrent hepatic metastasis. 2. Interval increase in size periaortic retroperitoneal lymph nodes consistent with recurrent metastatic adenopathy.    01/08/2018 -  Chemotherapy    Due to slight disease progression she restarted on Irinotecan plus Avastin every 2 weeks on 01/08/18. Added 5-Fu bolus and Leucovorin on 04/13/18.    04/09/2018 Imaging    04/09/2018 CT CAP W Contrast IMPRESSION: 1. Slight interval increase in size as well as development of new low-attenuation lesions within the liver. 2. Grossly unchanged bilateral pulmonary nodules. 3. Similar-appearing retroperitoneal adenopathy. 4. Small amount of gas within the endometrium of the uterus, nonspecific in etiology. Recommend clinical correlation for the possibility of infectious symptoms. Consider further evaluation with pelvic ultrasound as indicated.     06/07/2018 Imaging    06/07/2018 CT CAP IMPRESSION: Chest Impression:  1. Stable  bilateral pulmonary nodules. 2. Single new pulmonary nodule in the RIGHT lower lobe with ill-defined borders. Recommend short-term follow-up CT (6-8 weeks) as this may represent a focus of pulmonary infection.  Abdomen / Pelvis Impression:  1. Stable hepatic metastasis in the RIGHT hepatic lobe. No new lesions. 2. Small periaortic retroperitoneal lymph nodes are unchanged. 3. Sigmoid anastomosis without local recurrence. 4. Mild nodular contour of liver could indicate early cirrhosis.    PROBLEM LIST 1. Left breast DCIS diagnosed in 2006 and 2010, status post lumpectomy. 2. pT2N0M0 stage I colon cancer, s/p partial colectomy on 03/09/2014  3. PE in 12/2013 when she was diagnosed with colon cancer. 4. Right breast stage I breast cancer diagnosed in 01/2015  CURRENT TREATMENT:   1. Letrozole 2.5 mg once daily, started in September 2017 2. Irinotecan plus Avastin every 2 weeks started on 01/08/18, 5-fu bolus and leucovorin every 2 weeks added on 04/13/18   INTERVAL HISTORY: Ms. Stakes returns for follow up and next treatment as scheduled. She completed last cycle 5FU bolus, leucovorin, irinotecan and avastin on 07/07/18. Her activity level is increased. Appetite is stable. She denies n/v/c/d. Denies blood in stool  but saw a little when blowing her nose. She feels slightly "winded" with exertion that resolves quickly with rest. Denies chest pain, cough, fever, chills, or leg edema. Denies mucositis. Denies neuropathy. She has generalized body pain in right neck, right side/flank, and knees. Takes tylenol 2-3 times per day with good relief. She is requesting refill today.    MEDICAL HISTORY:  Past Medical History:  Diagnosis Date  . Anxiety   . Breast CA ([place])    radiation and surgery-lt.  . Cancer ([place])    left breast cancer x2  . Hypertension   . Liver lesion   . Personal history of chemotherapy   . Personal history of radiation therapy 2017  . Pulmonary embolism ([place])     "blood clot in lungs" -dx. 4'15 with CT Chest. On Xarelto  . Radiation 01/22/10-03/12/10   left whole breast 4500 cGy, upper aspect boosted to 6120 cGy  . Radiation 02/21/16 - 04/10/16   right breast 50.4 Gy, right breast boost 12 Gy  . Shortness of breath    sob with exertion. dx. with Pulmonary emboli 4'15 ,tx. Xarelto,Lovenox used for [employer].    SURGICAL HISTORY: Past Surgical History:  Procedure Laterality Date  . BREAST BIOPSY Left 2010   malignant  . BREAST BIOPSY Right 01/2015   malignant  . BREAST LUMPECTOMY Left 2006  . BREAST LUMPECTOMY Left 2010  . BREAST LUMPECTOMY Right 11/07/2015  . BREAST LUMPECTOMY WITH RADIOACTIVE SEED AND SENTINEL LYMPH NODE BIOPSY Right 11/07/2015   Procedure: BREAST LUMPECTOMY WITH RADIOACTIVE SEED AND SENTINEL LYMPH NODE BIOPSY;  Surgeon: Stark Klein, MD;  Location: [facility];  Service: General;  Laterality: Right;  . BREAST SURGERY     '11- Left breast lumpectomy  . CESAREAN SECTION    . CHOLECYSTECTOMY     Laparoscopic 20 yrs ago  . COLON SURGERY  03-09-14   partial colectomy for cancer  . IR GENERIC HISTORICAL  07/29/2016   IR FLUORO GUIDE PORT INSERTION RIGHT 07/29/2016 Sandi Mariscal, MD [facility]  . IR GENERIC HISTORICAL  07/29/2016   IR US GUIDE VASC ACCESS RIGHT 07/29/2016 Sandi Mariscal, MD [facility]  . IR GENERIC HISTORICAL  07/29/2016   IR US GUIDE BX ASP/DRAIN 07/29/2016 [facility]  . PARTIAL COLECTOMY N/A 03/09/2014   Procedure: LAPAROSCOPIC ASSISTED PARTIAL COLECTOMY, splenic flexure mobilization;  Surgeon: Leighton Ruff, MD;  Location: [facility];  Service: General;  Laterality: N/A;  . RADIOFREQUENCY ABLATION Right 08/24/2015   Procedure: MICROWAVE ABLATION RIGHT LIVER LOBE ;  Surgeon: Corrie Mckusick, DO;  Location: [facility];  Service: Anesthesiology;  Laterality: Right;    I have reviewed the social history and family history with the patient and they are unchanged from previous note.  ALLERGIES:  is allergic to  tramadol.  MEDICATIONS:  Current Outpatient Medications  Medication Sig Dispense Refill  . acetaminophen-codeine (TYLENOL #4) 300-60 MG tablet Take 1 tablet by mouth every 8 (eight) hours as needed. for pain 60 tablet 0  . amLODipine (NORVASC) 5 MG tablet Take 1 tablet (5 mg total) by mouth daily. 30 tablet 1  . ferrous sulfate 325 (65 FE) MG tablet Take 325 mg by mouth daily with breakfast. Pt takes  4 times per week.    . hydrochlorothiazide (HYDRODIURIL) 25 MG tablet TAKE 1 TABLET BY MOUTH EVERY IN THE MORNING 30 tablet 1  . letrozole (FEMARA) 2.5 MG tablet TAKE 1 TABLET BY MOUTH EVERY DAY 30 tablet 3  . lidocaine-prilocaine (EMLA) cream Apply  1 application topically as needed. 30 g 2  . metoprolol tartrate (LOPRESSOR) 50 MG tablet Take 1 tablet (50 mg total) by mouth 2 (two) times daily. 60 tablet 1  . metoprolol tartrate (LOPRESSOR) 50 MG tablet TAKE 1 TABLET BY MOUTH 2 TIMES DAILY 60 tablet 2  . potassium chloride SA (K-DUR,KLOR-CON) 20 MEQ tablet TAKE 1 TABLET BY MOUTH 2 TIMES DAILY 60 tablet 2  . XARELTO 20 MG TABS tablet TAKE 1 TABLET BY MOUTH EVERY DAY 30 tablet 3  . zolpidem (AMBIEN) 5 MG tablet Take 1 tablet (5 mg total) by mouth at bedtime as needed for sleep. 20 tablet 0  . LORazepam (ATIVAN) 0.5 MG tablet Take 1 tablet (0.5 mg total) by mouth every 8 (eight) hours. 30 tablet 0  . ondansetron (ZOFRAN) 8 MG tablet Take 1 tablet (8 mg total) by mouth 2 (two) times daily as needed for refractory nausea / vomiting. Start on day 3 after chemotherapy. 30 tablet 1  . prochlorperazine (COMPAZINE) 10 MG tablet Take 1 tablet (10 mg total) by mouth every 6 (six) hours as needed (NAUSEA). 30 tablet 2  . promethazine (PHENERGAN) 25 MG tablet Take 1 tablet (25 mg total) by mouth every 6 (six) hours as needed for nausea or vomiting. 30 tablet 2   No current facility-administered medications for this visit.    Facility-Administered Medications Ordered in Other Visits  Medication Dose Route  Frequency Provider Last Rate Last Dose  . atropine injection 0.5 mg  0.5 mg Intravenous Once PRN [name], MD      . bevacizumab (AVASTIN) 700 mg in sodium chloride 0.9 % 100 mL chemo infusion  700 mg Intravenous Once [name], MD      . fluorouracil (ADRUCIL) chemo injection 1,000 mg  405 mg/m2 (Treatment Plan Recorded) Intravenous Once [name], MD      . fosaprepitant (EMEND) 150 mg, dexamethasone (DECADRON) 10 mg in sodium chloride 0.9 % 145 mL IVPB   Intravenous Once [name], MD      . irinotecan (CAMPTOSAR) 320 mg in dextrose 5 % 500 mL chemo infusion  130 mg/m2 (Treatment Plan Recorded) Intravenous Once [name], MD      . leucovorin 976 mg in dextrose 5 % 250 mL infusion  400 mg/m2 (Treatment Plan Recorded) Intravenous Once [name], MD      . palonosetron Leona Carry) injection 0.25 mg  0.25 mg Intravenous Once [name], MD      . prochlorperazine (COMPAZINE) tablet 10 mg  10 mg Oral Once PRN [name], MD        PHYSICAL EXAMINATION: ECOG PERFORMANCE STATUS: 1 - Symptomatic but completely ambulatory  Vitals:   07/21/18 1128  BP: (!) 148/94  Pulse: 88  Resp: 18  Temp: 98.9 F (37.2 C)  SpO2: 98%   Filed Weights   07/21/18 1128  Weight: 298 lb 9.6 oz (135.4 kg)    GENERAL:alert, no distress and comfortable SKIN: no rashes or significant lesions EYES:  sclera clear OROPHARYNX:no thrush or ulcers LYMPH:  no palpable cervical or supraclavicular lymphadenopathy LUNGS: clear to auscultation with normal breathing effort HEART: regular rate & rhythm no lower extremity edema ABDOMEN: abdomen soft, non-tender and normal bowel sounds Musculoskeletal:no cyanosis of digits and no clubbing  NEURO: alert & oriented x 3 with fluent speech, no focal motor/sensory deficits PAC without erythema   LABORATORY DATA:  I have reviewed the data as listed CBC Latest Ref Rng & Units 07/21/2018 07/07/2018 06/08/2018  WBC  4.0 - 10.5 K/uL 8.4 10.7(H) 9.1  Hemoglobin 12.0 - 15.0 g/dL 13.1 13.5  12.8  Hematocrit 36.0 - 46.0 % 41.2 41.1 39.1  Platelets 150 - 400 K/uL 264 219 253     CMP Latest Ref Rng & Units 07/21/2018 07/07/2018 06/08/2018  Glucose 70 - 99 mg/dL 155(H) 140(H) 118(H)  BUN 8 - 23 mg/dL 8 10 7(L)  Creatinine 0.44 - 1.00 mg/dL 0.88 0.85 0.82  Sodium 135 - 145 mmol/L 140 142 141  Potassium 3.5 - 5.1 mmol/L 4.0 3.6 3.7  Chloride 98 - 111 mmol/L 103 104 104  CO2 22 - 32 mmol/L '24 23 26  ' Calcium 8.9 - 10.3 mg/dL 10.0 10.2 10.2  Total Protein 6.5 - 8.1 g/dL 7.7 8.1 7.8  Total Bilirubin 0.3 - 1.2 mg/dL 0.4 0.4 0.5  Alkaline Phos 38 - 126 U/L 64 79 58  AST 15 - 41 U/L 38 54(H) 30  ALT 0 - 44 U/L 31 42 24      RADIOGRAPHIC STUDIES: I have personally reviewed the radiological images as listed and agreed with the findings in the report. No results found.   ASSESSMENT & PLAN: [age] y.o.female  1. Colon Cancer, Stage I (pT2N0), MMR normal, oligo recurrent disease in liver in 12/2014, KRAS mutation (+), liver and lung recurrence in 06/2016 2. Right breast cancer, pT1bN0M0, stage Ia, ER+/PR+/HER2-, History of left Breast Cancer, diagnosed on 02/01/2015 - on letrozole  3. H/o PE (01/06/14) - on xarelto  4. Uterine fibroids 5. Right kidney lesion  6. Right flank pain, knee discomfort  7. Morbid obesity 8. HTN 9. Hypokalemia -on oral potassium supplement  10. Bilateral lower extremity swelling, left knee swelling 11. Left abdominal wall abscess - completed antibiotics, resolved   Ms. Sanders-Miller appears stable. She completed another cycle of 5FU bolus, leucovorin, irinotecan, and avastin. She tolerates treatment well overall. Pain is stable, she is not yet due for refill Tylenol #4. She continues letrozole. She completed augmentin, abdominal abscess resolved. BP slight elevated, I will refill amlodipine. She has not yet established with PCP but is on wait list. Labs stable, adequate for treatment. She will proceed with next cycle today. Plan to  restage in December. She will f/u in 2 weeks with next cycle.   PLAN: -Labs reviewed, proceed with next cycle 5FY bolus, leucovorin, irinotecan, and avastin -F/u in 2 weeks with next cycle  -Refill amlodipine   All questions were answered. The patient knows to call the clinic with any problems, questions or concerns. No barriers to learning was detected.     Alla Feeling, NP 07/21/18

## 2018-07-21 ENCOUNTER — Inpatient Hospital Stay (HOSPITAL_BASED_OUTPATIENT_CLINIC_OR_DEPARTMENT_OTHER): Payer: BLUE CROSS/BLUE SHIELD | Admitting: Nurse Practitioner

## 2018-07-21 ENCOUNTER — Inpatient Hospital Stay: Payer: BLUE CROSS/BLUE SHIELD | Attending: Hematology

## 2018-07-21 ENCOUNTER — Encounter: Payer: Self-pay | Admitting: Nurse Practitioner

## 2018-07-21 ENCOUNTER — Inpatient Hospital Stay: Payer: BLUE CROSS/BLUE SHIELD

## 2018-07-21 VITALS — BP 148/94 | HR 88 | Temp 98.9°F | Resp 18 | Ht 66.0 in | Wt 298.6 lb

## 2018-07-21 VITALS — BP 138/86

## 2018-07-21 DIAGNOSIS — E876 Hypokalemia: Secondary | ICD-10-CM | POA: Insufficient documentation

## 2018-07-21 DIAGNOSIS — C50511 Malignant neoplasm of lower-outer quadrant of right female breast: Secondary | ICD-10-CM

## 2018-07-21 DIAGNOSIS — C187 Malignant neoplasm of sigmoid colon: Secondary | ICD-10-CM | POA: Diagnosis present

## 2018-07-21 DIAGNOSIS — Z5111 Encounter for antineoplastic chemotherapy: Secondary | ICD-10-CM | POA: Insufficient documentation

## 2018-07-21 DIAGNOSIS — Z86711 Personal history of pulmonary embolism: Secondary | ICD-10-CM | POA: Insufficient documentation

## 2018-07-21 DIAGNOSIS — I10 Essential (primary) hypertension: Secondary | ICD-10-CM | POA: Insufficient documentation

## 2018-07-21 DIAGNOSIS — Z5112 Encounter for antineoplastic immunotherapy: Secondary | ICD-10-CM | POA: Diagnosis not present

## 2018-07-21 DIAGNOSIS — Z7901 Long term (current) use of anticoagulants: Secondary | ICD-10-CM | POA: Insufficient documentation

## 2018-07-21 DIAGNOSIS — C787 Secondary malignant neoplasm of liver and intrahepatic bile duct: Secondary | ICD-10-CM | POA: Diagnosis not present

## 2018-07-21 DIAGNOSIS — Z95828 Presence of other vascular implants and grafts: Secondary | ICD-10-CM

## 2018-07-21 DIAGNOSIS — M542 Cervicalgia: Secondary | ICD-10-CM

## 2018-07-21 DIAGNOSIS — R1084 Generalized abdominal pain: Secondary | ICD-10-CM

## 2018-07-21 DIAGNOSIS — C7800 Secondary malignant neoplasm of unspecified lung: Secondary | ICD-10-CM | POA: Diagnosis not present

## 2018-07-21 DIAGNOSIS — M25561 Pain in right knee: Secondary | ICD-10-CM

## 2018-07-21 DIAGNOSIS — M25562 Pain in left knee: Secondary | ICD-10-CM

## 2018-07-21 LAB — CBC WITH DIFFERENTIAL (CANCER CENTER ONLY)
Abs Immature Granulocytes: 0.02 10*3/uL (ref 0.00–0.07)
BASOS PCT: 0 %
Basophils Absolute: 0 10*3/uL (ref 0.0–0.1)
EOS ABS: 0.2 10*3/uL (ref 0.0–0.5)
EOS PCT: 2 %
HCT: 41.2 % (ref 36.0–46.0)
Hemoglobin: 13.1 g/dL (ref 12.0–15.0)
Immature Granulocytes: 0 %
Lymphocytes Relative: 26 %
Lymphs Abs: 2.2 10*3/uL (ref 0.7–4.0)
MCH: 30.3 pg (ref 26.0–34.0)
MCHC: 31.8 g/dL (ref 30.0–36.0)
MCV: 95.2 fL (ref 80.0–100.0)
MONOS PCT: 6 %
Monocytes Absolute: 0.5 10*3/uL (ref 0.1–1.0)
Neutro Abs: 5.5 10*3/uL (ref 1.7–7.7)
Neutrophils Relative %: 66 %
PLATELETS: 264 10*3/uL (ref 150–400)
RBC: 4.33 MIL/uL (ref 3.87–5.11)
RDW: 16.1 % — ABNORMAL HIGH (ref 11.5–15.5)
WBC Count: 8.4 10*3/uL (ref 4.0–10.5)
nRBC: 0 % (ref 0.0–0.2)

## 2018-07-21 LAB — CMP (CANCER CENTER ONLY)
ALBUMIN: 3 g/dL — AB (ref 3.5–5.0)
ALK PHOS: 64 U/L (ref 38–126)
ALT: 31 U/L (ref 0–44)
AST: 38 U/L (ref 15–41)
Anion gap: 13 (ref 5–15)
BILIRUBIN TOTAL: 0.4 mg/dL (ref 0.3–1.2)
BUN: 8 mg/dL (ref 8–23)
CALCIUM: 10 mg/dL (ref 8.9–10.3)
CO2: 24 mmol/L (ref 22–32)
Chloride: 103 mmol/L (ref 98–111)
Creatinine: 0.88 mg/dL (ref 0.44–1.00)
GFR, Est AFR Am: >60 mL/min (ref >60)
GFR, Estimated: >60 mL/min (ref >60)
GLUCOSE: 155 mg/dL — AB (ref 70–99)
Potassium: 4 mmol/L (ref 3.5–5.1)
Sodium: 140 mmol/L (ref 135–145)
TOTAL PROTEIN: 7.7 g/dL (ref 6.5–8.1)

## 2018-07-21 MED ORDER — SODIUM CHLORIDE 0.9 % IV SOLN
700.0000 mg | Freq: Once | INTRAVENOUS | Status: AC
  Administered 2018-07-21: 700 mg via INTRAVENOUS
  Filled 2018-07-21: qty 16

## 2018-07-21 MED ORDER — SODIUM CHLORIDE 0.9% FLUSH
10.0000 mL | INTRAVENOUS | Status: DC | PRN
  Administered 2018-07-21: 10 mL
  Filled 2018-07-21: qty 10

## 2018-07-21 MED ORDER — PALONOSETRON HCL INJECTION 0.25 MG/5ML
0.2500 mg | Freq: Once | INTRAVENOUS | Status: AC
  Administered 2018-07-21: 0.25 mg via INTRAVENOUS

## 2018-07-21 MED ORDER — AMLODIPINE BESYLATE 5 MG PO TABS: 5.0000 mg | ORAL_TABLET | Freq: Every day | ORAL | 1 refills | Status: DC

## 2018-07-21 MED ORDER — SODIUM CHLORIDE 0.9 % IV SOLN
Freq: Once | INTRAVENOUS | Status: AC
  Administered 2018-07-21: 14:00:00 via INTRAVENOUS
  Filled 2018-07-21: qty 250

## 2018-07-21 MED ORDER — ATROPINE SULFATE 1 MG/ML IJ SOLN
INTRAMUSCULAR | Status: AC
  Filled 2018-07-21: qty 1

## 2018-07-21 MED ORDER — PROCHLORPERAZINE MALEATE 10 MG PO TABS
ORAL_TABLET | ORAL | Status: AC
  Filled 2018-07-21: qty 1

## 2018-07-21 MED ORDER — SODIUM CHLORIDE 0.9 % IV SOLN
Freq: Once | INTRAVENOUS | Status: AC
  Administered 2018-07-21: 13:00:00 via INTRAVENOUS
  Filled 2018-07-21: qty 5

## 2018-07-21 MED ORDER — SODIUM CHLORIDE 0.9% FLUSH
10.0000 mL | INTRAVENOUS | Status: DC | PRN
  Administered 2018-07-21: 10 mL via INTRAVENOUS
  Filled 2018-07-21: qty 10

## 2018-07-21 MED ORDER — SODIUM CHLORIDE 0.9 % IV SOLN
Freq: Once | INTRAVENOUS | Status: AC
  Administered 2018-07-21: 12:00:00 via INTRAVENOUS
  Filled 2018-07-21: qty 250

## 2018-07-21 MED ORDER — PROCHLORPERAZINE MALEATE 10 MG PO TABS
10.0000 mg | ORAL_TABLET | Freq: Once | ORAL | Status: AC | PRN
  Administered 2018-07-21: 10 mg via ORAL

## 2018-07-21 MED ORDER — ATROPINE SULFATE 1 MG/ML IJ SOLN
0.5000 mg | Freq: Once | INTRAMUSCULAR | Status: AC | PRN
  Administered 2018-07-21: 0.5 mg via INTRAVENOUS

## 2018-07-21 MED ORDER — PALONOSETRON HCL INJECTION 0.25 MG/5ML
INTRAVENOUS | Status: AC
  Filled 2018-07-21: qty 5

## 2018-07-21 MED ORDER — FLUOROURACIL CHEMO INJECTION 2.5 GM/50ML
405.0000 mg/m2 | Freq: Once | INTRAVENOUS | Status: AC
  Administered 2018-07-21: 1000 mg via INTRAVENOUS
  Filled 2018-07-21: qty 20

## 2018-07-21 MED ORDER — HEPARIN SOD (PORK) LOCK FLUSH 100 UNIT/ML IV SOLN
500.0000 [IU] | Freq: Once | INTRAVENOUS | Status: AC | PRN
  Administered 2018-07-21: 500 [IU]
  Filled 2018-07-21: qty 5

## 2018-07-21 MED ORDER — LEUCOVORIN CALCIUM INJECTION 350 MG
400.0000 mg/m2 | Freq: Once | INTRAVENOUS | Status: AC
  Administered 2018-07-21: 976 mg via INTRAVENOUS
  Filled 2018-07-21: qty 48.8

## 2018-07-21 MED ORDER — IRINOTECAN HCL CHEMO INJECTION 100 MG/5ML
130.0000 mg/m2 | Freq: Once | INTRAVENOUS | Status: AC
  Administered 2018-07-21: 320 mg via INTRAVENOUS
  Filled 2018-07-21: qty 15

## 2018-07-21 NOTE — Patient Instructions (Signed)
[facility] Discharge Instructions for Patients Receiving Chemotherapy  Today you received the following chemotherapy agents Avastin, Irinotecan, Leucovorin and Adrucil  To help prevent nausea and vomiting after your treatment, we encourage you to take your nausea medication  As directed.    If you develop nausea and vomiting that is not controlled by your nausea medication, call the clinic.   BELOW ARE SYMPTOMS THAT SHOULD BE REPORTED IMMEDIATELY:  *FEVER GREATER THAN 100.5 F  *CHILLS WITH OR WITHOUT FEVER  NAUSEA AND VOMITING THAT IS NOT CONTROLLED WITH YOUR NAUSEA MEDICATION  *UNUSUAL SHORTNESS OF BREATH  *UNUSUAL BRUISING OR BLEEDING  TENDERNESS IN MOUTH AND THROAT WITH OR WITHOUT PRESENCE OF ULCERS  *URINARY PROBLEMS  *BOWEL PROBLEMS  UNUSUAL RASH Items with * indicate a potential emergency and should be followed up as soon as possible.  Feel free to call the clinic should you have any questions or concerns. The clinic phone number is [phone].  Please show the [facility] at check-in to the Emergency Department and triage nurse.

## 2018-07-22 ENCOUNTER — Telehealth: Payer: Self-pay | Admitting: Hematology

## 2018-07-22 NOTE — Telephone Encounter (Signed)
Appts scheduled letter/calendar mailed/patient notified per 11/6 los

## 2018-07-26 ENCOUNTER — Telehealth: Payer: Self-pay | Admitting: *Deleted

## 2018-07-26 NOTE — Telephone Encounter (Signed)
Received TC from patient inquiring about  A refill on her Tylenol #4. She states she saw Cira Rue, NP last week (07/21/18) and was told it would be sent in, but pt states it is not at her pharmacy ([facility] on [address]) as of this morning. [place] refill appears to be on 07/08/18 for 60 tabs

## 2018-07-28 ENCOUNTER — Other Ambulatory Visit: Payer: Self-pay | Admitting: Hematology

## 2018-07-28 DIAGNOSIS — C187 Malignant neoplasm of sigmoid colon: Secondary | ICD-10-CM

## 2018-07-29 ENCOUNTER — Other Ambulatory Visit: Payer: Self-pay | Admitting: Hematology

## 2018-07-29 DIAGNOSIS — C187 Malignant neoplasm of sigmoid colon: Secondary | ICD-10-CM

## 2018-07-29 MED ORDER — ACETAMINOPHEN-CODEINE #4 300-60 MG PO TABS: 1.0000 | ORAL_TABLET | Freq: Three times a day (TID) | ORAL | 0 refills | Status: DC | PRN

## 2018-08-03 NOTE — Progress Notes (Signed)
[place]  [phone] [phone]  Clinic Follow Up Note   DATE OF VISIT: 08/04/2018   [name], MD [address] [place] [address]  DIAGNOSIS: recurrent Cancer of sigmoid colon ([place]) - Plan: heparin lock flush 100 unit/mL  Malignant neoplasm of lower-outer quadrant of right breast of female, estrogen receptor positive ([place])  Essential hypertension - Plan: amLODipine (NORVASC) 5 MG tablet  PROBLEM LIST 1. Left breast DCIS diagnosed in 2006 and 2010, status post lumpectomy. 2. pT2N0M0 stage I colon cancer, s/p partial colectomy on 03/09/2014  3. PE in 12/2013 when she was diagnosed with colon cancer. 4. 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 [facility])   Staging form: Breast, AJCC 7th Edition     Clinical stage from 02/01/2015: Stage IA (T1a, N0, M0) - Signed by [name], MD on 06/04/2015     Pathologic stage from 11/10/2015: Stage IA (T1b, N0, M0) - Signed by [name], MD on 12/08/2015 Cancer of sigmoid colon [facility])   Staging form: Colon and Rectum, AJCC 7th Edition     Clinical: Stage I (T2, N0, M0) - Unsigned     Breast cancer of lower-outer quadrant of right female breast ([place])   2006 Cancer Diagnosis    Left breast DCIS diagnosed in 2006 and 2010, status post lumpectomy.    01/07/2014 Initial Diagnosis    Breast cancer    01/18/2015 Mammogram    71m mass in lower outer quadrant of right breast     02/01/2015 Initial Biopsy    (R) breast mass biopsy showed invasive ductal carcinoma & DCIS, grade 1-2.     02/01/2015 Receptors her2    ER 90%+, PR 10%+, Ki67 10%, HER2 negative (ratio 1.09)     11/07/2015 Surgery    (R) breast lumpectomy and sentinel lymph node biopsy ([name]    11/07/2015 Pathology Results    (R) breast lumpectomy showed invasive ductal carcinoma, grade 2, 1 cm, low-grade DCIS, negative margins, 3  sentinel lymph nodes were negative. LVI (-). HER2 repeated and remains negative (ratio 1.29).      11/07/2015 Pathologic Stage    pT1b,pN0: Stage IA     11/07/2015 Oncotype testing    RS 22, which predicts a year risk of distant recurrence with tamoxifen alone 14% (intermediate-risk). Adjuvant chemo was not offered    02/21/2016 - 04/10/2016 Radiation Therapy    Adjuvant breast radiation. ([name]). Right breast: 50.4 Gy in 28 fractions.  Right breast boost: 12 Gy in 6 fractions.    05/2016 -  Anti-estrogen oral therapy    Femara (Letrozole) 2.5 mg daily.     10/17/2016 Mammogram    MM DIAG BREAST TOMO BILATERAL 10/17/2016 FINDINGS: There are bilateral lumpectomy changes in the upper central left breast and upper central right breast. No mass, nonsurgical distortion, or suspicious microcalcification is identified in either breast to suggest malignancy. Mammographic images were processed with CAD. IMPRESSION: No evidence of malignancy in either breast. Lumpectomy changes bilaterally. RECOMMENDATION: Diagnostic mammogram is suggested in 1 year. (Code:DM-B-01Y)    12/16/2017 Mammogram    IMPRESSION: No mammographic evidence of malignancy in either breast, status post bilateral mastectomies. RECOMMENDATION: Diagnostic mammogram is suggested in 1 year.      Cancer of [place]moid colon [place]   03/04/2014 Initial Diagnosis    Cancer of sigmoid colon    03/09/2014 Pathology Results  G1 invasive adenocarcinoma, no lymph-Vascular invasion or Peri-neural invasion: Absent. MMR normal, MSI stable.     03/09/2014 Surgery    partial colectomy, negative margins.     11/10/2014 Relapse/Recurrence    biopsy confirmed oligo liver recurrence     11/10/2014 Imaging    PET showed two small ares of hypermetabolism in the right lobe of the liver, no other distant metastasis.     11/30/2014 Imaging    abdomen MRI showed a new enhancing lesion which corresponds to an area of abnormal hyper metabolism on recent  PET-CT. Stable enlarged portal caval lymph node.     01/02/2015 Pathology Results    Liver biopsy showed metastatic adenocarcinoma, IHC (+) for CK20, CDX-2, (-) for TTF-1 and ER    01/02/2015 Miscellaneous    liver biopsy KRAS mutation (+)     02/14/2015 Imaging    CT showed:  the small metastatic right hepatic lobe liver lesion is not identified for certain on this examination. No new lesions. Stable small cysts in segment 4 a.     03/05/2015 - 04/16/2015 Chemotherapy    CAPEOX (capecitabine 2500 mg twice daily Day 1-14, oxaliplatin 1 30 mg/m on day 1, every 21 days, S/P 3 cycles     08/24/2015 Procedure    CT-guided oligo liver metastasis microwave ablation by Dr. [name]    06/17/2016 Imaging    CT of the chest/abd/pelvis with contrast 1. Several new small pulmonary nodules worrisome for metastatic disease. 2. New large area of probable infiltrating recurrent tumor in the right hepatic lobe surrounding the prior ablation site. 3. Small supraclavicular lymph nodes.  Attention on future studies. 4. Stable surgical changes involving both breasts. No breast masses are identified. 5. Stable bilateral adrenal gland nodules. 6. Stable upper abdominal and right-sided retroperitoneal lymph nodes.    07/08/2016 PET scan    1. Large mass in the right lobe of the liver demonstrates diffuse hypermetabolism, compatible with recurrent metastatic disease in the liver 2. Multiple small pulmonary nodules are very similar to the recent Chest CT 06/17/16.  3. No other findings of metastatic disease elsewhere in the neck, chest, abdomen, or pelvis. 4. There is some low-level hypermetabolic activity adjacent to the surgical clips in the lateral aspect of the right breast at the site of prior lumpectomy.     07/29/2016 Pathology Results    Liver biopsy confirmed metastatic colon cancer     07/31/2016 - 07/31/2017 Chemotherapy    Chemotherapy FOLFIRI and Avastin (from cycle 2) and Leucovorin (added from  cycle 8) every 2 weeks, started on 07/31/2016, multiple delay per pt's request; chemo break from 5/30-6/21/18; decreased leucovorin to 2000 mg/m2, irinotecan to 167m/m2, 5-fu 20043mm2 from 03/05/17, due to poor tolerance. Started chemo break since 07/31/17.     10/23/2016 Imaging    CT ABDOMEN PELVIS W CONTRAST 10/24/16 IMPRESSION: 1. The numerous tiny bilateral pulmonary nodules seen on the previous imaging exams are no longer evident, suggesting response of therapy. 2. Large ill-defined irregular right hepatic lesion measures smaller on today's study. 3. Slight increase in size of hepatoduodenal ligament lymphadenopathy. 4. Abdominal Aortic Atherosclerois (ICD10-170.0)    01/05/2017 Imaging    CT CAP IMPRESSION: 1. Left lower lobe pulmonary nodule and hepatic metastatic disease are stable. 2. Hepatoduodenal ligament lymph node is stable. 3. Aortic atherosclerosis (ICD10-170.0). Coronary artery calcification. 4. Enlarged pulmonary arteries, indicative of pulmonary arterial hypertension. 5. Small bilateral adrenal nodules are unchanged.    05/09/2017 Imaging    CT CAP IMPRESSION: 1.  Stable appearance left lower lobe pulmonary nodule with an apparent new tiny posterior right upper lobe pulmonary nodule. 2. Dominant ill-defined right hepatic mass measures smaller on today's study. 3. Slight decrease in the hepatoduodenal ligament lymph node. 4. Aortic Atherosclerois (ICD10-170.0)    05/09/2017 Imaging    CT CAP 05/09/17 IMPRESSION: 1. Stable appearance left lower lobe pulmonary nodule with an apparent new tiny posterior right upper lobe pulmonary nodule. Continued attention on follow-up recommended. 2. Dominant ill-defined right hepatic mass measures smaller on today's study. 3. Slight decrease in the hepatoduodenal ligament lymph node. 4.  Aortic Atherosclerois (ICD10-170.0)     08/11/2017 Imaging    CT CAP W Contrast 08/11/17 IMPRESSION: 1. Essentially stable appearance of small  pulmonary nodules, hypodense mass laterally in the right hepatic lobe, and mild portacaval adenopathy. No new or progressive lesions. 2. Other imaging findings of potential clinical significance: Aortic Atherosclerosis (ICD10-I70.0). Lower lumbar impingement due to spondylosis and degenerative disc disease. Uterine fibroids. Bilateral renal cysts. Coronary atherosclerosis. Mild mitral valve calcification. Mild cardiomegaly.      09/24/2017 - 12/04/2017 Chemotherapy    Maintenance Xeloda 2500 mg twice daily days 1 through 14 of each cycle of treatment and Avastin given every 3 weeks starting on 09/24/2017. Due to slight progression this was stopped on 12/04/17     12/02/2017 Imaging    CT CAP W Contrast 12/02/17 IMPRESSION: Chest Impression: 1. Interval increase in size of bilateral small pulmonary nodules. 2. No mediastinal adenopathy  Abdomen / Pelvis Impression: 1. Dominant lesion in the RIGHT hepatic lobe is similar; however there are 2 adjacent low-density lesions in the RIGHT hepatic lobe which are not appreciated on comparison exam and concerning recurrent hepatic metastasis. 2. Interval increase in size periaortic retroperitoneal lymph nodes consistent with recurrent metastatic adenopathy.    01/08/2018 -  Chemotherapy    Due to slight disease progression she restarted on Irinotecan plus Avastin every 2 weeks on 01/08/18. Added 5-Fu bolus and Leucovorin on 04/13/18.    04/09/2018 Imaging    04/09/2018 CT CAP W Contrast IMPRESSION: 1. Slight interval increase in size as well as development of new low-attenuation lesions within the liver. 2. Grossly unchanged bilateral pulmonary nodules. 3. Similar-appearing retroperitoneal adenopathy. 4. Small amount of gas within the endometrium of the uterus, nonspecific in etiology. Recommend clinical correlation for the possibility of infectious symptoms. Consider further evaluation with pelvic ultrasound as indicated.     06/07/2018 Imaging     06/07/2018 CT CAP IMPRESSION: Chest Impression:  1. Stable bilateral pulmonary nodules. 2. Single new pulmonary nodule in the RIGHT lower lobe with ill-defined borders. Recommend short-term follow-up CT (6-8 weeks) as this may represent a focus of pulmonary infection.  Abdomen / Pelvis Impression:  1. Stable hepatic metastasis in the RIGHT hepatic lobe. No new lesions. 2. Small periaortic retroperitoneal lymph nodes are unchanged. 3. Sigmoid anastomosis without local recurrence. 4. Mild nodular contour of liver could indicate early cirrhosis.    CURRENT TREATMENT:   1. Letrozole 2.5 mg once daily, started in September 2017 2. Irinotecan plus Avastin every 2 weeks started on 01/08/18, 5-fu bolus and leucovorin every 2 weeks added on 04/13/18    INTERIM HISTORY:   SHINEKA AUBLE 62 y.o. female with a history of left breast cancer, colon cancer s/p laparoscopic-assisted partial colectomy (03/09/2014), PE (01/06/2014), and right breast cancer (01/2015), presents to clinic for follow up. She has seen NP Lacie in the interim and was noted to have generalized body aches.  Today, she  is here alone. She feels tired and feels depressed.She says that the port site bothers her and she takes advil for the pain. She says that she feels uncomfortable turning her head towards the port site and feels like it's pulling her. She says that she noticed mild bleeding from her nose when she blows her nose.  She is tearful and states that she feels lost, but tries to spend time with her grandson. She says that she sweats more now.    MEDICAL HISTORY: Past Medical History:  Diagnosis Date  . Anxiety   . Breast CA ([place])    radiation and surgery-lt.  . Cancer ([place])    left breast cancer x2  . Hypertension   . Liver lesion   . Personal history of chemotherapy   . Personal history of radiation therapy 2017  . Pulmonary embolism ([place])    "blood clot in lungs" -dx. 4'15 with CT Chest. On  Xarelto  . Radiation 01/22/10-03/12/10   left whole breast 4500 cGy, upper aspect boosted to 6120 cGy  . Radiation 02/21/16 - 04/10/16   right breast 50.4 Gy, right breast boost 12 Gy  . Shortness of breath    sob with exertion. dx. with Pulmonary emboli 4'15 ,tx. Xarelto,Lovenox used for [employer].    ALLERGIES:  is allergic to tramadol.  MEDICATIONS:  Allergies as of 08/04/2018      Reactions   Tramadol Other (See Comments)   Dizziness      Medication List        Accurate as of 08/04/18  9:41 PM. Always use your most recent med list.          acetaminophen-codeine 300-60 MG tablet Commonly known as:  TYLENOL #4 Take 1 tablet by mouth every 8 (eight) hours as needed. for pain   amLODipine 5 MG tablet Commonly known as:  NORVASC Take 1 tablet (5 mg total) by mouth daily.   ferrous sulfate 325 (65 FE) MG tablet Take 325 mg by mouth daily with breakfast. Pt takes  4 times per week.   hydrochlorothiazide 25 MG tablet Commonly known as:  HYDRODIURIL TAKE 1 TABLET BY MOUTH EVERY IN THE MORNING   letrozole 2.5 MG tablet Commonly known as:  FEMARA TAKE 1 TABLET BY MOUTH EVERY DAY   lidocaine-prilocaine cream Commonly known as:  EMLA Apply 1 application topically as needed.   LORazepam 0.5 MG tablet Commonly known as:  ATIVAN Take 1 tablet (0.5 mg total) by mouth every 8 (eight) hours.   metoprolol tartrate 50 MG tablet Commonly known as:  LOPRESSOR Take 1 tablet (50 mg total) by mouth 2 (two) times daily.   metoprolol tartrate 50 MG tablet Commonly known as:  LOPRESSOR TAKE 1 TABLET BY MOUTH 2 TIMES DAILY   ondansetron 8 MG tablet Commonly known as:  ZOFRAN Take 1 tablet (8 mg total) by mouth 2 (two) times daily as needed for refractory nausea / vomiting. Start on day 3 after chemotherapy.   potassium chloride SA 20 MEQ tablet Commonly known as:  K-DUR,KLOR-CON TAKE 1 TABLET BY MOUTH 2 TIMES DAILY   prochlorperazine 10 MG tablet Commonly known as:   COMPAZINE Take 1 tablet (10 mg total) by mouth every 6 (six) hours as needed (NAUSEA).   promethazine 25 MG tablet Commonly known as:  PHENERGAN Take 1 tablet (25 mg total) by mouth every 6 (six) hours as needed for nausea or vomiting.   XARELTO 20 MG Tabs tablet Generic drug:  rivaroxaban TAKE 1 TABLET  BY MOUTH EVERY DAY   zolpidem 5 MG tablet Commonly known as:  AMBIEN Take 1 tablet (5 mg total) by mouth at bedtime as needed for sleep.       SURGICAL HISTORY:  Past Surgical History:  Procedure Laterality Date  . BREAST BIOPSY Left 2010   malignant  . BREAST BIOPSY Right 01/2015   malignant  . BREAST LUMPECTOMY Left 2006  . BREAST LUMPECTOMY Left 2010  . BREAST LUMPECTOMY Right 11/07/2015  . BREAST LUMPECTOMY WITH RADIOACTIVE SEED AND SENTINEL LYMPH NODE BIOPSY Right 11/07/2015   Procedure: BREAST LUMPECTOMY WITH RADIOACTIVE SEED AND SENTINEL LYMPH NODE BIOPSY;  Surgeon: Stark Klein, MD;  Location: [place];  Service: General;  Laterality: Right;  . BREAST SURGERY     '11- Left breast lumpectomy  . CESAREAN SECTION    . CHOLECYSTECTOMY     Laparoscopic 20 yrs ago  . COLON SURGERY  03-09-14   partial colectomy for cancer  . IR GENERIC HISTORICAL  07/29/2016   IR FLUORO GUIDE PORT INSERTION RIGHT 07/29/2016 Sandi Mariscal, MD [facility]  . IR GENERIC HISTORICAL  07/29/2016   IR US GUIDE VASC ACCESS RIGHT 07/29/2016 Sandi Mariscal, MD [facility]  . IR GENERIC HISTORICAL  07/29/2016   IR US GUIDE BX ASP/DRAIN 07/29/2016 [facility]  . PARTIAL COLECTOMY N/A 03/09/2014   Procedure: LAPAROSCOPIC ASSISTED PARTIAL COLECTOMY, splenic flexure mobilization;  Surgeon: Leighton Ruff, MD;  Location: [facility];  Service: General;  Laterality: N/A;  . RADIOFREQUENCY ABLATION Right 08/24/2015   Procedure: MICROWAVE ABLATION RIGHT LIVER LOBE ;  Surgeon: Corrie Mckusick, DO;  Location: [facility];  Service: Anesthesiology;  Laterality: Right;    REVIEW OF SYSTEMS:  Constitutional:  Denies fevers, chills or abnormal weight loss (+) sweating  Eyes: Denies blurriness of vision Ears, nose, mouth, throat, and face: Denies mucositis, no ulcers  Respiratory: Denies cough, or wheezes  Cardiovascular: Denies palpitation, chest discomfort. No new LL edema  Gastrointestinal:  Denies heartburn or change in bowel habits.  Skin: Denies abnormal skin rashes (+) pain at port site MSK: No myalgia, or joint pain  Lymphatics: Denies new lymphadenopathy or easy bruising Neurological:Denies numbness, tingling or new weaknesses   Behavioral/Psych: (+) depressed, tearful All other systems were reviewed with the patient and are negative.    PHYSICAL EXAMINATION:   ECOG PERFORMANCE STATUS: 2-3 Today's Vitals   08/04/18 1058 08/04/18 1100  BP: (!) 157/94   Pulse: 100   Resp: 17   Temp: 98.3 F (36.8 C)   TempSrc: Oral   SpO2: 99%   Weight: (!) 300 lb 1.6 oz (136.1 kg)   Height: '5\' 6"'  (1.676 m)   PainSc:  3    Body mass index is 48.44 kg/m.  GENERAL:alert, no distress and comfortable; well developed and obese. Easily mobile to exam table. (+) hair loss SKIN: skin color, texture, turgor are normal, no rashes or significant lesions EYES: normal, Conjunctiva are pink and non-injected, sclera clear OROPHARYNX:no exudate, no erythema and lips, buccal mucosa, and tongue normal  NECK: supple, thyroid normal size, non-tender, without nodularity LYMPH:  no palpable lymphadenopathy in the cervical, axillary or supraclavicular LUNGS: clear to auscultation with normal breathing effort, no wheezes or rhonchi  HEART: regular rate & rhythm and no murmurs  ABDOMEN:abdomen soft, non-tender and normal bowel sounds; incision healing with signs of infection.  Mild hepatomegaly with tenderness at the RUQ, no abdominal tenderness. Musculoskeletal:no cyanosis of digits and no clubbing, limited ROM of left shoulder, no  myalgia (+) knee pain  NEURO: alert & oriented x 3 with fluent speech, no focal  motor/sensory deficits   Labs:  CBC Latest Ref Rng & Units 08/04/2018 07/21/2018 07/07/2018  WBC 4.0 - 10.5 K/uL 7.8 8.4 10.7(H)  Hemoglobin 12.0 - 15.0 g/dL 13.2 13.1 13.5  Hematocrit 36.0 - 46.0 % 40.6 41.2 41.1  Platelets 150 - 400 K/uL 256 264 219    CMP Latest Ref Rng & Units 08/04/2018 07/21/2018 07/07/2018  Glucose 70 - 99 mg/dL 137(H) 155(H) 140(H)  BUN 8 - 23 mg/dL '9 8 10  ' Creatinine 0.44 - 1.00 mg/dL 0.85 0.88 0.85  Sodium 135 - 145 mmol/L 140 140 142  Potassium 3.5 - 5.1 mmol/L 3.9 4.0 3.6  Chloride 98 - 111 mmol/L 103 103 104  CO2 22 - 32 mmol/L '25 24 23  ' Calcium 8.9 - 10.3 mg/dL 9.9 10.0 10.2  Total Protein 6.5 - 8.1 g/dL 7.8 7.7 8.1  Total Bilirubin 0.3 - 1.2 mg/dL 0.5 0.4 0.4  Alkaline Phos 38 - 126 U/L 63 64 79  AST 15 - 41 U/L 51(H) 38 54(H)  ALT 0 - 44 U/L 34 31 42    CEA  01/18/2014: 0.8 03/01/2015: 1.3 01/31/2016: 4.8 05/28/2016: 48 07/31/2016: 173.7 08/28/16: 139 09/25/2016: 43 11/04/16: 9.94 12/18/2016: 6.99 01/29/17: 4.58 03/04/17: 6.09 04/09/17: 4.22 05/07/17: 5.29 06/04/17: 4.12 07/02/17: 6.78 07/30/17 : 5.35  09/24/17: 5.41 10/23/17: 8.03 11/13/17: 12.32 01/01/2018: 27.70 02/05/2018: 39.93 03/12/18: 36.22  04/13/2018: 38.65 05/11/18: 37.13 06/08/2018: 37.38 07/07/18: 48.73    PATHOLOGY RESULT Diagnosis 01/02/2015 Liver, needle/core biopsy, right - POSITIVE FOR METASTATIC ADENOCARCINOMA. - SEE COMMENT.  Diagnosis 11/07/2015 1. Breast, lumpectomy, Right - INVASIVE DUCTAL CARCINOMA, GRADE 2, SPANNING 1 CM. - DUCTAL CARCINOMA IN SITU, LOW GRADE. - RESECTION MARGINS ARE NEGATIVE FOR INVASIVE CARCINOMA. - DUCTAL CARCINOMA IN SITU COMES TO WITHIN 0.3 CM OF THE POSTERIOR MARGIN. - BIOPSY SITE. - SEE ONCOLOGY TABLE. 2. Lymph node, sentinel, biopsy, right axillary #1 - ONE OF ONE LYMPH NODES NEGATIVE FOR CARCINOMA (0/1). 3. Lymph node, sentinel, biopsy, right axillary #2 - ONE OF ONE LYMPH NODES NEGATIVE FOR CARCINOMA (0/1). 4. Lymph node, sentinel,  biopsy, right axillary #3 - ONE OF ONE LYMPH NODES NEGATIVE FOR CARCINOMA (0/1).  ONCOTYPE DX: RS 73, which predicts a year risk of distant recurrence with tamoxifen alone 14%   Diagnosis 07/29/2016 Liver, needle/core biopsy, posterior of right lobe METASTATIC ADENOCARCINOMA, CONSISTENT WITH COLONIC PRIMARY.  RADIOLOGY STUDIES   06/07/2018 CT CAP IMPRESSION: Chest Impression:  1. Stable bilateral pulmonary nodules. 2. Single new pulmonary nodule in the RIGHT lower lobe with ill-defined borders. Recommend short-term follow-up CT (6-8 weeks) as this may represent a focus of pulmonary infection.  Abdomen / Pelvis Impression:  1. Stable hepatic metastasis in the RIGHT hepatic lobe. No new lesions. 2. Small periaortic retroperitoneal lymph nodes are unchanged. 3. Sigmoid anastomosis without local recurrence. 4. Mild nodular contour of liver could indicate early cirrhosis.  04/09/2018 CT CAP W Contrast IMPRESSION: 1. Slight interval increase in size as well as development of new low-attenuation lesions within the liver. 2. Grossly unchanged bilateral pulmonary nodules. 3. Similar-appearing retroperitoneal adenopathy. 4. Small amount of gas within the endometrium of the uterus, nonspecific in etiology. Recommend clinical correlation for the possibility of infectious symptoms. Consider further evaluation with pelvic ultrasound as indicated.  12/16/2017 Diagnostic Mammogram IMPRESSION: No mammographic evidence of malignancy in either breast, status post bilateral mastectomies.  12/03/2017 CT CAP W Contrast IMPRESSION: Chest Impression:  1.  Interval increase in size of bilateral small pulmonary nodules. 2. No mediastinal adenopathy  Abdomen / Pelvis Impression:  1. Dominant lesion in the RIGHT hepatic lobe is similar; however there are 2 adjacent low-density lesions in the RIGHT hepatic lobe which are not appreciated on comparison exam and concerning recurrent hepatic  metastasis. 2. Interval increase in size periaortic retroperitoneal lymph nodes consistent with recurrent metastatic adenopathy   PET 07/08/2016 IMPRESSION: 1. Large mass in the right lobe of the liver demonstrates diffuse hypermetabolism, compatible with recurrent metastatic disease in the liver. 2. Multiple small pulmonary nodules are very similar to the recent chest CT 06/17/2016. These do not demonstrate hypermetabolism on the PET images, however, these lesions are well below the level of PET imaging. Given that these nodules are new compared to prior chest CT 12/18/2015, they are concerning for potential metastatic disease to the lungs, and continued attention on followup studies is recommended. 3. No other findings of metastatic disease elsewhere in the neck, chest, abdomen or pelvis. 4. There is some low-level hypermetabolic activity adjacent to the surgical clips in the lateral aspect of the right breast at the site of prior lumpectomy. This area has undergone interval involution over the several prior examinations, and this low-level activity is favored to be related to normal healing. Attention on follow-up imaging is recommended to ensure the stability or resolution of this finding. 5. Aortic atherosclerosis, in addition to 2 vessel coronary artery disease. Please note that although the presence of coronary artery calcium documents the presence of coronary artery disease, the severity of this disease and any potential stenosis cannot be assessed on this non-gated CT examination. Assessment for potential risk factor modification, dietary therapy or pharmacologic therapy may be warranted, if clinically indicated. 6. Additional incidental findings, as above.  MM DIAG BREAST TOMO BILATERAL 10/17/2016 FINDINGS: There are bilateral lumpectomy changes in the upper central left breast and upper central right breast. No mass, nonsurgical distortion, or suspicious microcalcification is  identified in either breast to suggest malignancy. Mammographic images were processed with CAD. IMPRESSION: No evidence of malignancy in either breast. Lumpectomy changes bilaterally. RECOMMENDATION: Diagnostic mammogram is suggested in 1 year. (Code:DM-B-01Y)  CT CHEST, ABDOMEN PELVIS W CONTRAST 10/24/16 IMPRESSION: 1. The numerous tiny bilateral pulmonary nodules seen on the previous imaging exams are no longer evident, suggesting response of therapy. 2. Large ill-defined irregular right hepatic lesion measures smaller on today's study. 3. Slight increase in size of hepatoduodenal ligament lymphadenopathy. 4. Abdominal Aortic Atherosclerois (ICD10-170.0)  CT CAP 01/05/17 IMPRESSION: 1. Left lower lobe pulmonary nodule and hepatic metastatic disease are stable. 2. Hepatoduodenal ligament lymph node is stable. 3. Aortic atherosclerosis (ICD10-170.0). Coronary artery calcification. 4. Enlarged pulmonary arteries, indicative of pulmonary arterial hypertension. 5. Small bilateral adrenal nodules are unchanged.  CT CAP 05/09/17 IMPRESSION: 1. Stable appearance left lower lobe pulmonary nodule with an apparent new tiny posterior right upper lobe pulmonary nodule. Continued attention on follow-up recommended. 2. Dominant ill-defined right hepatic mass measures smaller on today's study. 3. Slight decrease in the hepatoduodenal ligament lymph node. 4.  Aortic Atherosclerois (ICD10-170.0)   CT CAP W Contrast 08/11/17 IMPRESSION: 1. Essentially stable appearance of small pulmonary nodules, hypodense mass laterally in the right hepatic lobe, and mild portacaval adenopathy. No new or progressive lesions. 2. Other imaging findings of potential clinical significance: Aortic Atherosclerosis (ICD10-I70.0). Lower lumbar impingement due to spondylosis and degenerative disc disease. Uterine fibroids. Bilateral renal cysts. Coronary atherosclerosis. Mild mitral valve calcification. Mild  cardiomegaly.   CT CAP  W Contrast 12/02/17 IMPRESSION: Chest Impression: 1. Interval increase in size of bilateral small pulmonary nodules. 2. No mediastinal adenopathy  Abdomen / Pelvis Impression: 1. Dominant lesion in the RIGHT hepatic lobe is similar; however there are 2 adjacent low-density lesions in the RIGHT hepatic lobe which are not appreciated on comparison exam and concerning recurrent hepatic metastasis. 2. Interval increase in size periaortic retroperitoneal lymph nodes consistent with recurrent metastatic adenopathy.  MM DIAG BREAST, 12/16/2017 IMPRESSION: No mammographic evidence of malignancy in either breast, status post bilateral mastectomies.  ASSESSMENT:   Veronica Reyes 62 y.o. female   1. Colon Cancer, Stage I (pT2N0), MMR normal, oligo recurrent disease in liver in 12/2014, KRAS mutation (+), liver and lung recurrence in 06/2016 -I previously reviewed the nature history of metastatic colon cancer, and the potential curative approach with chemotherapy followed by liver lesion ablation or resection. However, more likely, this is not curable disease. -She initially received 3 cycles of Capox, did not want to proceed with fourth cycle due to the moderate side effects from previous chemotherapy treatment. She declined liver mass resection, and underwent liver ablation by interventional radiologist Dr. Earleen Newport in 08/2015. -I previously reviewed her restaging CT scan from 06/17/2016, which unfortunately showed new area of infiltrative disease as the previous ablated liver lesion, likely recurrence. She also developed several new pulmonary nodules, small, concerning for metastatic disease. 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 -She started chemotherapy FOLFIRI and Avastin on 08/13/16. Inially she tolerated well but after many cycles she did develop side effects. I previously had to decrease her  chemo dose a few times or pt requested a break several times for multiple reasons. Last FOLFIRI chemo was on 07/30/17 (cycle 18). -She did have a good response to chemo, her CEA significantly decreased. CT scan from 05/09/2017 showed continuous response to treatment, her liver metastasis slightly decreased in size.  -After CT CAP from 08/11/17 which showed stable controlled disease, I was able to switch her to maintenance therapy. She started Xeloda with Avastin every 3 weeks on 09/24/17.  -CT CAP W Contrast from 12/02/17 reveals interval increase in size of bilateral small pulmonary nodules and two new lesions in the right hepatic lobe, although her previous liver metastasis in the right lobe has decreased in size.  -Her treatment was subsequently changed back to irinotecan and avastin. Pt declined 5-fu pump on 01/08/18 -She was not very compliant with treatment, due to her trips, her husband's surgery etc. - A 04/09/2018 CT CAP W Contrast showed Slight interval increase in size as well as development of new metastatic lesions within the liver, Grossly unchanged bilateral pulmonary nodules, and Similar-appearing retroperitoneal adenopathy. I reviewed with pt in detail. This scan was compared to her scan 4 months previous, however she only received 1 cycle of urine taken in the first 2 months after previous scan, some of her disease progression is probably related to lack of consistent treatment. -Due to the disease progression, I previously recommended her to add a 5-FU pump infusion back. However patient still declined. After lengthy discussion, she is agreeable to add 5-FU bolus and leucovorin with each irinotecan treatment on 04/13/18.  -She has tolerated the addition of 5-FU bolus and leucovorin well and her mucositis has much improved -I reviewed her restaging CT scan from 06/07/2018 , which showed stable disease, no new lesions except a 13 mm lung nodule which is indeterminate, will f/u closely. -Labs  reviewed, CBC and  CMP are overall adequate for treatment. However she has been extremely fatigued since last cycle chemo 2 weeks ago, also she feels down depressed.  I offered her a 2 weeks chemo break, for the upcoming Holiday. She is pleased.  -we discussed change chemo to every 3 weeks, she will think about it  -Restaging CT scan in later December. -Continue chemo and follow-up in 2 weeks   2. Right breast cancer, pT1bN0M0, stage Ia, ER+/PR+/HER2-, History of left Breast Cancer, diagnosed on 02/01/2015 -Left CIS in situ, s/p lumpectomy in 2006 and 2011 by Dr. Margot Chimes, and radiation in 2011 by Dr. Sondra Come.  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 Surgical margins were negative. -I previously reviewed her Oncotype DX result. Her recurrence score is 22, which predicts 14% 10 year risk of distant recurrence with tamoxifen alone. This is intermediate risk, based on her relatively low number, I do not recommend adjuvant chemotherapy. -She has completed adjuvant breast radiation with Dr. Sondra Come in July 2017.  -her [facility] level supported she is postmenopausal -She started adjuvant letrozole in 05/2016, tolerating well, we'll continue for a total of 5-7 years  -We previously discussed breast cancer surveillance, I strongly encouraged her to continue annual screening mammogram, self exam, and follow-up with [place] routinely. -diagnostic mammogram on 12/16/2017 showed: No mammographic evidence of malignancy in either breast, status post bilateral mastectomies.   3. History of PE (01/06/2014) -She has a diagnosis of pulmonary embolism, likely secondary to malignancy in the setting of family history for stroke (and possible stroke history in patient as well). Lower extremity dopplers negative.  -Continue Xarelto. Due to her metastatic colon cancer, I previously recommended her to continue indefinitely.  4.  Uterine Fibroids    --Negative endometrial biopsy. Following with Gynecology.  5. Kidney lesion (Right) -No hypermetabolic right kidney lesion on the pet scan -Repeat abdominal MRI 05/29/2015 showed unchanged right renal lesion, favored to represent a complex/septate cyst.  6. Right flank pain, left Knee discomfort  -possibly related to her liver metastasis -I previously suggested she see a orthopedics for her knee issues.  -Continue Tylenol No. 4 as needed for pain. Her abdominal pain overall has improved, controlled.  -Her left knee swelling has improved, pain is stable    7.  Morbid obesity - I have previously encouraged her to eat healthy and exercise more.  8. HTN  -We previously discussed that Avastin can increase her blood pressure, I recommend her to continue monitoring at home -She was mildly hypertensive on 07/16/17, we will increase her dose of Amlodipine to 9m if needed in the future.  -Continue Amlodipine, lopressor and HCTZ  -BP stable   9. Hypokalemia  -Given intermittent diarrhea she will continue oral K supplement for now at 1 tab BID.  -Continue oral K BID  11. Port site pain -She complains of pain at her port site and she finds it difficult to mover her head to the site of the port. -No signs of inflammation -I advised her to use a warm compress and topical medications for pain relief.     Plan  -I refilled amlodipine and hydrochlorothiazide.  -No chemo today, 2 weeks chemo break due to fatigue, and holiday -repeat scan in later December -f/u in 2 weeks for next cycle chemo    All questions were answered. The patient knows to call the clinic with any problems, questions or concerns. We can certainly see the  patient much sooner if necessary.  I spent 20 minutes counseling the patient face to face. The total time spent in the appointment was 25 minutes.  Dierdre Searles Dweik am acting as scribe for Dr. [name].  I have reviewed the above documentation for accuracy and  completeness, and I agree with the above.    [name]  08/04/2018

## 2018-08-04 ENCOUNTER — Encounter: Payer: Self-pay | Admitting: Hematology

## 2018-08-04 ENCOUNTER — Inpatient Hospital Stay: Payer: BLUE CROSS/BLUE SHIELD

## 2018-08-04 ENCOUNTER — Inpatient Hospital Stay (HOSPITAL_BASED_OUTPATIENT_CLINIC_OR_DEPARTMENT_OTHER): Payer: BLUE CROSS/BLUE SHIELD | Admitting: Hematology

## 2018-08-04 VITALS — BP 157/94 | HR 100 | Temp 98.3°F | Resp 17 | Ht 66.0 in | Wt 300.1 lb

## 2018-08-04 DIAGNOSIS — C787 Secondary malignant neoplasm of liver and intrahepatic bile duct: Secondary | ICD-10-CM

## 2018-08-04 DIAGNOSIS — Z7901 Long term (current) use of anticoagulants: Secondary | ICD-10-CM

## 2018-08-04 DIAGNOSIS — R911 Solitary pulmonary nodule: Secondary | ICD-10-CM

## 2018-08-04 DIAGNOSIS — I10 Essential (primary) hypertension: Secondary | ICD-10-CM

## 2018-08-04 DIAGNOSIS — T82848A Pain from vascular prosthetic devices, implants and grafts, initial encounter: Secondary | ICD-10-CM

## 2018-08-04 DIAGNOSIS — C187 Malignant neoplasm of sigmoid colon: Secondary | ICD-10-CM | POA: Diagnosis not present

## 2018-08-04 DIAGNOSIS — C7800 Secondary malignant neoplasm of unspecified lung: Secondary | ICD-10-CM | POA: Diagnosis not present

## 2018-08-04 DIAGNOSIS — C50511 Malignant neoplasm of lower-outer quadrant of right female breast: Secondary | ICD-10-CM | POA: Diagnosis not present

## 2018-08-04 DIAGNOSIS — Z95828 Presence of other vascular implants and grafts: Secondary | ICD-10-CM

## 2018-08-04 DIAGNOSIS — Z86711 Personal history of pulmonary embolism: Secondary | ICD-10-CM

## 2018-08-04 DIAGNOSIS — Z170 Estrogen receptor positive status [ER+]: Secondary | ICD-10-CM

## 2018-08-04 DIAGNOSIS — Z5112 Encounter for antineoplastic immunotherapy: Secondary | ICD-10-CM | POA: Diagnosis not present

## 2018-08-04 LAB — CBC WITH DIFFERENTIAL (CANCER CENTER ONLY)
Abs Immature Granulocytes: 0.02 10*3/uL (ref 0.00–0.07)
BASOS ABS: 0 10*3/uL (ref 0.0–0.1)
Basophils Relative: 0 %
EOS ABS: 0.1 10*3/uL (ref 0.0–0.5)
EOS PCT: 1 %
HCT: 40.6 % (ref 36.0–46.0)
Hemoglobin: 13.2 g/dL (ref 12.0–15.0)
Immature Granulocytes: 0 %
Lymphocytes Relative: 31 %
Lymphs Abs: 2.4 10*3/uL (ref 0.7–4.0)
MCH: 30.6 pg (ref 26.0–34.0)
MCHC: 32.5 g/dL (ref 30.0–36.0)
MCV: 94 fL (ref 80.0–100.0)
Monocytes Absolute: 0.5 10*3/uL (ref 0.1–1.0)
Monocytes Relative: 6 %
NRBC: 0 % (ref 0.0–0.2)
Neutro Abs: 4.8 10*3/uL (ref 1.7–7.7)
Neutrophils Relative %: 62 %
Platelet Count: 256 10*3/uL (ref 150–400)
RBC: 4.32 MIL/uL (ref 3.87–5.11)
RDW: 16.8 % — ABNORMAL HIGH (ref 11.5–15.5)
WBC Count: 7.8 10*3/uL (ref 4.0–10.5)

## 2018-08-04 LAB — CMP (CANCER CENTER ONLY)
ALT: 34 U/L (ref 0–44)
AST: 51 U/L — ABNORMAL HIGH (ref 15–41)
Albumin: 3 g/dL — ABNORMAL LOW (ref 3.5–5.0)
Alkaline Phosphatase: 63 U/L (ref 38–126)
Anion gap: 12 (ref 5–15)
BUN: 9 mg/dL (ref 8–23)
CO2: 25 mmol/L (ref 22–32)
Calcium: 9.9 mg/dL (ref 8.9–10.3)
Chloride: 103 mmol/L (ref 98–111)
Creatinine: 0.85 mg/dL (ref 0.44–1.00)
GFR, Est AFR Am: >60 mL/min (ref >60)
GFR, Estimated: >60 mL/min (ref >60)
Glucose, Bld: 137 mg/dL — ABNORMAL HIGH (ref 70–99)
Potassium: 3.9 mmol/L (ref 3.5–5.1)
Sodium: 140 mmol/L (ref 135–145)
Total Bilirubin: 0.5 mg/dL (ref 0.3–1.2)
Total Protein: 7.8 g/dL (ref 6.5–8.1)

## 2018-08-04 LAB — CEA (IN HOUSE-CHCC): CEA (CHCC-IN HOUSE): 50.31 ng/mL — AB (ref 0.00–5.00)

## 2018-08-04 MED ORDER — AMLODIPINE BESYLATE 5 MG PO TABS: 5.0000 mg | ORAL_TABLET | Freq: Every day | ORAL | 3 refills | Status: DC

## 2018-08-04 MED ORDER — SODIUM CHLORIDE 0.9% FLUSH
10.0000 mL | INTRAVENOUS | Status: DC | PRN
  Administered 2018-08-04: 10 mL via INTRAVENOUS
  Filled 2018-08-04: qty 10

## 2018-08-04 MED ORDER — HYDROCHLOROTHIAZIDE 25 MG PO TABS: ORAL_TABLET | ORAL | 3 refills | Status: DC

## 2018-08-04 MED ORDER — HEPARIN SOD (PORK) LOCK FLUSH 100 UNIT/ML IV SOLN
500.0000 [IU] | Freq: Once | INTRAVENOUS | Status: AC
  Administered 2018-08-04: 500 [IU] via INTRAVENOUS
  Filled 2018-08-04: qty 5

## 2018-08-04 NOTE — Progress Notes (Signed)
Per Dr. Burr Medico cancel treatment for today, notified Loren RN Charge in Infusion

## 2018-08-09 ENCOUNTER — Telehealth: Payer: Self-pay | Admitting: Hematology

## 2018-08-09 NOTE — Telephone Encounter (Signed)
Per 11/20 los I called MD to let her know that the treatment areas was capped on 12/3 and 12/4. She said ok to leave on 12/5 if I could not move it.

## 2018-08-18 ENCOUNTER — Telehealth: Payer: Self-pay | Admitting: *Deleted

## 2018-08-18 ENCOUNTER — Other Ambulatory Visit: Payer: Self-pay | Admitting: Nurse Practitioner

## 2018-08-18 DIAGNOSIS — C187 Malignant neoplasm of sigmoid colon: Secondary | ICD-10-CM

## 2018-08-18 MED ORDER — ACETAMINOPHEN-CODEINE #4 300-60 MG PO TABS: 1.0000 | ORAL_TABLET | Freq: Three times a day (TID) | ORAL | 0 refills | Status: DC | PRN

## 2018-08-18 NOTE — Telephone Encounter (Signed)
"  Veronica Reyes calling to have appointments changed.  Previously I've told office I can only come in on Tuesday or Wednesday.  I'm scheduled tomorrow and the time is at 1:00 pm.  I've never done this before.  I've left two messages for scheduling and two messages for nurse." Conveyed treatment area surpassed capacity on both Tuesday and Wednesday.  Will transfer to collaborative for further assistance. "I need my treatment.  They will need to reschedule treatment to next week."

## 2018-08-18 NOTE — Progress Notes (Deleted)
[place]  Telephone:([phone]) [phone] Fax:(336) [phone]  Clinic Follow up Note   Patient Care Team: [name], MD as PCP - General (Internal Medicine) Leighton Ruff, MD as Consulting Physician (General Surgery) Carol Ada, MD as Consulting Physician (Gastroenterology) Concha Norway, MD (Inactive) as Consulting Physician (Internal Medicine) Emily Filbert, MD as Consulting Physician (Obstetrics and Gynecology) Tania Ade, RN as Registered Nurse (Medical Oncology) Stark Klein, MD as Consulting Physician (General Surgery) [name], MD as Consulting Physician (Hematology) 08/18/2018  SUMMARY OF ONCOLOGIC HISTORY: Oncology History   Breast cancer of lower-outer quadrant of right female breast [facility]   Staging form: Breast, AJCC 7th Edition     Clinical stage from 02/01/2015: Stage IA (T1a, N0, M0) - Signed by [name], MD on 06/04/2015     Pathologic stage from 11/10/2015: Stage IA (T1b, N0, M0) - Signed by [name], MD on 12/08/2015 Cancer of sigmoid colon [facility])   Staging form: Colon and Rectum, AJCC 7th Edition     Clinical: Stage I (T2, N0, M0) - Unsigned     Breast cancer of lower-outer quadrant of right female breast ([place])   2006 Cancer Diagnosis    Left breast DCIS diagnosed in 2006 and 2010, status post lumpectomy.    01/07/2014 Initial Diagnosis    Breast cancer    01/18/2015 Mammogram    32m mass in lower outer quadrant of right breast     02/01/2015 Initial Biopsy    (R) breast mass biopsy showed invasive ductal carcinoma & DCIS, grade 1-2.     02/01/2015 Receptors her2    ER 90%+, PR 10%+, Ki67 10%, HER2 negative (ratio 1.09)     11/07/2015 Surgery    (R) breast lumpectomy and sentinel lymph node biopsy ([name]    11/07/2015 Pathology Results    (R) breast lumpectomy showed invasive ductal carcinoma, grade 2, 1 cm, low-grade DCIS, negative margins, 3 sentinel lymph nodes were negative. LVI (-). HER2 repeated and remains negative (ratio  1.29).      11/07/2015 Pathologic Stage    pT1b,pN0: Stage IA     11/07/2015 Oncotype testing    RS 22, which predicts a year risk of distant recurrence with tamoxifen alone 14% (intermediate-risk). Adjuvant chemo was not offered    02/21/2016 - 04/10/2016 Radiation Therapy    Adjuvant breast radiation. ([name]). Right breast: 50.4 Gy in 28 fractions.  Right breast boost: 12 Gy in 6 fractions.    05/2016 -  Anti-estrogen oral therapy    Femara (Letrozole) 2.5 mg daily.     10/17/2016 Mammogram    MM DIAG BREAST TOMO BILATERAL 10/17/2016 FINDINGS: There are bilateral lumpectomy changes in the upper central left breast and upper central right breast. No mass, nonsurgical distortion, or suspicious microcalcification is identified in either breast to suggest malignancy. Mammographic images were processed with CAD. IMPRESSION: No evidence of malignancy in either breast. Lumpectomy changes bilaterally. RECOMMENDATION: Diagnostic mammogram is suggested in 1 year. (Code:[id number])    12/16/2017 Mammogram    IMPRESSION: No mammographic evidence of malignancy in either breast, status post bilateral mastectomies. RECOMMENDATION: Diagnostic mammogram is suggested in 1 year.      Cancer of sigmoid colon ([place]   03/04/2014 Initial Diagnosis    Cancer of sigmoid colon    03/09/2014 Pathology Results    G1 invasive adenocarcinoma, no lymph-Vascular invasion or Peri-neural invasion: Absent. MMR normal, MSI stable.     03/09/2014 Surgery    partial colectomy, negative margins.  11/10/2014 Relapse/Recurrence    biopsy confirmed oligo liver recurrence     11/10/2014 Imaging    PET showed two small ares of hypermetabolism in the right lobe of the liver, no other distant metastasis.     11/30/2014 Imaging    abdomen MRI showed a new enhancing lesion which corresponds to an area of abnormal hyper metabolism on recent PET-CT. Stable enlarged portal caval lymph node.     01/02/2015 Pathology Results     Liver biopsy showed metastatic adenocarcinoma, IHC (+) for CK20, CDX-2, (-) for TTF-1 and ER    01/02/2015 Miscellaneous    liver biopsy KRAS mutation (+)     02/14/2015 Imaging    CT showed:  the small metastatic right hepatic lobe liver lesion is not identified for certain on this examination. No new lesions. Stable small cysts in segment 4 a.     03/05/2015 - 04/16/2015 Chemotherapy    CAPEOX (capecitabine 2500 mg twice daily Day 1-14, oxaliplatin 1 30 mg/m on day 1, every 21 days, S/P 3 cycles     08/24/2015 Procedure    CT-guided oligo liver metastasis microwave ablation by Dr. [name]    06/17/2016 Imaging    CT of the chest/abd/pelvis with contrast 1. Several new small pulmonary nodules worrisome for metastatic disease. 2. New large area of probable infiltrating recurrent tumor in the right hepatic lobe surrounding the prior ablation site. 3. Small supraclavicular lymph nodes.  Attention on future studies. 4. Stable surgical changes involving both breasts. No breast masses are identified. 5. Stable bilateral adrenal gland nodules. 6. Stable upper abdominal and right-sided retroperitoneal lymph nodes.    07/08/2016 PET scan    1. Large mass in the right lobe of the liver demonstrates diffuse hypermetabolism, compatible with recurrent metastatic disease in the liver 2. Multiple small pulmonary nodules are very similar to the recent Chest CT 06/17/16.  3. No other findings of metastatic disease elsewhere in the neck, chest, abdomen, or pelvis. 4. There is some low-level hypermetabolic activity adjacent to the surgical clips in the lateral aspect of the right breast at the site of prior lumpectomy.     07/29/2016 Pathology Results    Liver biopsy confirmed metastatic colon cancer     07/31/2016 - 07/31/2017 Chemotherapy    Chemotherapy FOLFIRI and Avastin (from cycle 2) and Leucovorin (added from cycle 8) every 2 weeks, started on 07/31/2016, multiple delay per pt's request; chemo  break from 5/30-6/21/18; decreased leucovorin to 2000 mg/m2, irinotecan to 119m/m2, 5-fu 20052mm2 from 03/05/17, due to poor tolerance. Started chemo break since 07/31/17.     10/23/2016 Imaging    CT ABDOMEN PELVIS W CONTRAST 10/24/16 IMPRESSION: 1. The numerous tiny bilateral pulmonary nodules seen on the previous imaging exams are no longer evident, suggesting response of therapy. 2. Large ill-defined irregular right hepatic lesion measures smaller on today's study. 3. Slight increase in size of hepatoduodenal ligament lymphadenopathy. 4. Abdominal Aortic Atherosclerois (ICD10-170.0)    01/05/2017 Imaging    CT CAP IMPRESSION: 1. Left lower lobe pulmonary nodule and hepatic metastatic disease are stable. 2. Hepatoduodenal ligament lymph node is stable. 3. Aortic atherosclerosis (ICD10-170.0). Coronary artery calcification. 4. Enlarged pulmonary arteries, indicative of pulmonary arterial hypertension. 5. Small bilateral adrenal nodules are unchanged.    05/09/2017 Imaging    CT CAP IMPRESSION: 1. Stable appearance left lower lobe pulmonary nodule with an apparent new tiny posterior right upper lobe pulmonary nodule. 2. Dominant ill-defined right hepatic mass measures smaller on today's study. 3. Slight  decrease in the hepatoduodenal ligament lymph node. 4. Aortic Atherosclerois (ICD10-170.0)    05/09/2017 Imaging    CT CAP 05/09/17 IMPRESSION: 1. Stable appearance left lower lobe pulmonary nodule with an apparent new tiny posterior right upper lobe pulmonary nodule. Continued attention on follow-up recommended. 2. Dominant ill-defined right hepatic mass measures smaller on today's study. 3. Slight decrease in the hepatoduodenal ligament lymph node. 4.  Aortic Atherosclerois (ICD10-170.0)     08/11/2017 Imaging    CT CAP W Contrast 08/11/17 IMPRESSION: 1. Essentially stable appearance of small pulmonary nodules, hypodense mass laterally in the right hepatic lobe, and  mild portacaval adenopathy. No new or progressive lesions. 2. Other imaging findings of potential clinical significance: Aortic Atherosclerosis (ICD10-I70.0). Lower lumbar impingement due to spondylosis and degenerative disc disease. Uterine fibroids. Bilateral renal cysts. Coronary atherosclerosis. Mild mitral valve calcification. Mild cardiomegaly.      09/24/2017 - 12/04/2017 Chemotherapy    Maintenance Xeloda 2500 mg twice daily days 1 through 14 of each cycle of treatment and Avastin given every 3 weeks starting on 09/24/2017. Due to slight progression this was stopped on 12/04/17     12/02/2017 Imaging    CT CAP W Contrast 12/02/17 IMPRESSION: Chest Impression: 1. Interval increase in size of bilateral small pulmonary nodules. 2. No mediastinal adenopathy  Abdomen / Pelvis Impression: 1. Dominant lesion in the RIGHT hepatic lobe is similar; however there are 2 adjacent low-density lesions in the RIGHT hepatic lobe which are not appreciated on comparison exam and concerning recurrent hepatic metastasis. 2. Interval increase in size periaortic retroperitoneal lymph nodes consistent with recurrent metastatic adenopathy.    01/08/2018 -  Chemotherapy    Due to slight disease progression she restarted on Irinotecan plus Avastin every 2 weeks on 01/08/18. Added 5-Fu bolus and Leucovorin on 04/13/18.    04/09/2018 Imaging    04/09/2018 CT CAP W Contrast IMPRESSION: 1. Slight interval increase in size as well as development of new low-attenuation lesions within the liver. 2. Grossly unchanged bilateral pulmonary nodules. 3. Similar-appearing retroperitoneal adenopathy. 4. Small amount of gas within the endometrium of the uterus, nonspecific in etiology. Recommend clinical correlation for the possibility of infectious symptoms. Consider further evaluation with pelvic ultrasound as indicated.     06/07/2018 Imaging    06/07/2018 CT CAP IMPRESSION: Chest Impression:  1. Stable  bilateral pulmonary nodules. 2. Single new pulmonary nodule in the RIGHT lower lobe with ill-defined borders. Recommend short-term follow-up CT (6-8 weeks) as this may represent a focus of pulmonary infection.  Abdomen / Pelvis Impression:  1. Stable hepatic metastasis in the RIGHT hepatic lobe. No new lesions. 2. Small periaortic retroperitoneal lymph nodes are unchanged. 3. Sigmoid anastomosis without local recurrence. 4. Mild nodular contour of liver could indicate early cirrhosis.     INTERVAL HISTORY: Please see below for problem oriented charting.  REVIEW OF SYSTEMS:   Constitutional: Denies fevers, chills or abnormal weight loss Eyes: Denies blurriness of vision Ears, nose, mouth, throat, and face: Denies mucositis or sore throat Respiratory: Denies cough, dyspnea or wheezes Cardiovascular: Denies palpitation, chest discomfort or lower extremity swelling Gastrointestinal:  Denies nausea, heartburn or change in bowel habits Skin: Denies abnormal skin rashes Lymphatics: Denies new lymphadenopathy or easy bruising Neurological:Denies numbness, tingling or new weaknesses Behavioral/Psych: Mood is stable, no new changes  All other systems were reviewed with the patient and are negative.  MEDICAL HISTORY:  Past Medical History:  Diagnosis Date  . Anxiety   . Breast CA ([place])  radiation and surgery-lt.  . Cancer ([place])    left breast cancer x2  . Hypertension   . Liver lesion   . Personal history of chemotherapy   . Personal history of radiation therapy 2017  . Pulmonary embolism ([place])    "blood clot in lungs" -dx. 4'15 with CT Chest. On Xarelto  . Radiation 01/22/10-03/12/10   left whole breast 4500 cGy, upper aspect boosted to 6120 cGy  . Radiation 02/21/16 - 04/10/16   right breast 50.4 Gy, right breast boost 12 Gy  . Shortness of breath    sob with exertion. dx. with Pulmonary emboli 4'15 ,tx. Xarelto,Lovenox used for [employer].    SURGICAL HISTORY: Past Surgical  History:  Procedure Laterality Date  . BREAST BIOPSY Left 2010   malignant  . BREAST BIOPSY Right 01/2015   malignant  . BREAST LUMPECTOMY Left 2006  . BREAST LUMPECTOMY Left 2010  . BREAST LUMPECTOMY Right 11/07/2015  . BREAST LUMPECTOMY WITH RADIOACTIVE SEED AND SENTINEL LYMPH NODE BIOPSY Right 11/07/2015   Procedure: BREAST LUMPECTOMY WITH RADIOACTIVE SEED AND SENTINEL LYMPH NODE BIOPSY;  Surgeon: Stark Klein, MD;  Location: [facility];  Service: General;  Laterality: Right;  . BREAST SURGERY     '11- Left breast lumpectomy  . CESAREAN SECTION    . CHOLECYSTECTOMY     Laparoscopic 20 yrs ago  . COLON SURGERY  03-09-14   partial colectomy for cancer  . IR GENERIC HISTORICAL  07/29/2016   IR FLUORO GUIDE PORT INSERTION RIGHT 07/29/2016 Sandi Mariscal, MD [facility]  . IR GENERIC HISTORICAL  07/29/2016   IR US GUIDE VASC ACCESS RIGHT 07/29/2016 Sandi Mariscal, MD [facility]  . IR GENERIC HISTORICAL  07/29/2016   IR US GUIDE BX ASP/DRAIN 07/29/2016 [facility]  . PARTIAL COLECTOMY N/A 03/09/2014   Procedure: LAPAROSCOPIC ASSISTED PARTIAL COLECTOMY, splenic flexure mobilization;  Surgeon: Leighton Ruff, MD;  Location: [facility];  Service: General;  Laterality: N/A;  . RADIOFREQUENCY ABLATION Right 08/24/2015   Procedure: MICROWAVE ABLATION RIGHT LIVER LOBE ;  Surgeon: Corrie Mckusick, DO;  Location: [facility];  Service: Anesthesiology;  Laterality: Right;    I have reviewed the social history and family history with the patient and they are unchanged from previous note.  ALLERGIES:  is allergic to tramadol.  MEDICATIONS:  Current Outpatient Medications  Medication Sig Dispense Refill  . acetaminophen-codeine (TYLENOL #4) 300-60 MG tablet Take 1 tablet by mouth every 8 (eight) hours as needed. for pain 70 tablet 0  . amLODipine (NORVASC) 5 MG tablet Take 1 tablet (5 mg total) by mouth daily. 30 tablet 3  . ferrous sulfate 325 (65 FE) MG tablet Take 325 mg by mouth daily with  breakfast. Pt takes  4 times per week.    . hydrochlorothiazide (HYDRODIURIL) 25 MG tablet TAKE 1 TABLET BY MOUTH EVERY IN THE MORNING 30 tablet 3  . letrozole (FEMARA) 2.5 MG tablet TAKE 1 TABLET BY MOUTH EVERY DAY 30 tablet 3  . lidocaine-prilocaine (EMLA) cream Apply 1 application topically as needed. 30 g 2  . LORazepam (ATIVAN) 0.5 MG tablet Take 1 tablet (0.5 mg total) by mouth every 8 (eight) hours. 30 tablet 0  . metoprolol tartrate (LOPRESSOR) 50 MG tablet Take 1 tablet (50 mg total) by mouth 2 (two) times daily. 60 tablet 1  . metoprolol tartrate (LOPRESSOR) 50 MG tablet TAKE 1 TABLET BY MOUTH 2 TIMES DAILY 60 tablet 2  . ondansetron (ZOFRAN) 8 MG tablet Take 1 tablet (  8 mg total) by mouth 2 (two) times daily as needed for refractory nausea / vomiting. Start on day 3 after chemotherapy. 30 tablet 1  . potassium chloride SA (K-DUR,KLOR-CON) 20 MEQ tablet TAKE 1 TABLET BY MOUTH 2 TIMES DAILY 60 tablet 2  . prochlorperazine (COMPAZINE) 10 MG tablet Take 1 tablet (10 mg total) by mouth every 6 (six) hours as needed (NAUSEA). 30 tablet 2  . promethazine (PHENERGAN) 25 MG tablet Take 1 tablet (25 mg total) by mouth every 6 (six) hours as needed for nausea or vomiting. 30 tablet 2  . XARELTO 20 MG TABS tablet TAKE 1 TABLET BY MOUTH EVERY DAY 30 tablet 3  . zolpidem (AMBIEN) 5 MG tablet Take 1 tablet (5 mg total) by mouth at bedtime as needed for sleep. 20 tablet 0   No current facility-administered medications for this visit.     PHYSICAL EXAMINATION: ECOG PERFORMANCE STATUS: {CHL ONC ECOG PS:[id number]}  There were no vitals filed for this visit. There were no vitals filed for this visit.  GENERAL:alert, no distress and comfortable SKIN: skin color, texture, turgor are normal, no rashes or significant lesions EYES: normal, Conjunctiva are pink and non-injected, sclera clear OROPHARYNX:no exudate, no erythema and lips, buccal mucosa, and tongue normal  NECK: supple, thyroid normal size,  non-tender, without nodularity LYMPH:  no palpable lymphadenopathy in the cervical, axillary or inguinal LUNGS: clear to auscultation and percussion with normal breathing effort HEART: regular rate & rhythm and no murmurs and no lower extremity edema ABDOMEN:abdomen soft, non-tender and normal bowel sounds Musculoskeletal:no cyanosis of digits and no clubbing  NEURO: alert & oriented x 3 with fluent speech, no focal motor/sensory deficits  LABORATORY DATA:  I have reviewed the data as listed CBC Latest Ref Rng & Units 08/04/2018 07/21/2018 07/07/2018  WBC 4.0 - 10.5 K/uL 7.8 8.4 10.7(H)  Hemoglobin 12.0 - 15.0 g/dL 13.2 13.1 13.5  Hematocrit 36.0 - 46.0 % 40.6 41.2 41.1  Platelets 150 - 400 K/uL 256 264 219     CMP Latest Ref Rng & Units 08/04/2018 07/21/2018 07/07/2018  Glucose 70 - 99 mg/dL 137(H) 155(H) 140(H)  BUN 8 - 23 mg/dL '9 8 10  ' Creatinine 0.44 - 1.00 mg/dL 0.85 0.88 0.85  Sodium 135 - 145 mmol/L 140 140 142  Potassium 3.5 - 5.1 mmol/L 3.9 4.0 3.6  Chloride 98 - 111 mmol/L 103 103 104  CO2 22 - 32 mmol/L '25 24 23  ' Calcium 8.9 - 10.3 mg/dL 9.9 10.0 10.2  Total Protein 6.5 - 8.1 g/dL 7.8 7.7 8.1  Total Bilirubin 0.3 - 1.2 mg/dL 0.5 0.4 0.4  Alkaline Phos 38 - 126 U/L 63 64 79  AST 15 - 41 U/L 51(H) 38 54(H)  ALT 0 - 44 U/L 34 31 42      RADIOGRAPHIC STUDIES: I have personally reviewed the radiological images as listed and agreed with the findings in the report. No results found.   ASSESSMENT & PLAN:  No problem-specific Assessment & Plan notes found for this encounter.   No orders of the defined types were placed in this encounter.  All questions were answered. The patient knows to call the clinic with any problems, questions or concerns. No barriers to learning was detected. I spent {CHL ONC TIME VISIT - BBCWU:[id number]} counseling the patient face to face. The total time spent in the appointment was {CHL ONC TIME VISIT - UUEKC:[id number]} and more than 50% was  on counseling and review of test results  Alla Feeling, NP 08/18/18

## 2018-08-19 ENCOUNTER — Inpatient Hospital Stay: Payer: BLUE CROSS/BLUE SHIELD

## 2018-08-19 ENCOUNTER — Telehealth: Payer: Self-pay | Admitting: Hematology

## 2018-08-19 ENCOUNTER — Inpatient Hospital Stay: Payer: BLUE CROSS/BLUE SHIELD | Admitting: Nurse Practitioner

## 2018-08-19 NOTE — Telephone Encounter (Signed)
R/s appt per 12/5 sch message - pt is aware of appt date and time

## 2018-08-24 ENCOUNTER — Inpatient Hospital Stay: Payer: BLUE CROSS/BLUE SHIELD | Attending: Hematology

## 2018-08-24 ENCOUNTER — Encounter: Payer: Self-pay | Admitting: Nurse Practitioner

## 2018-08-24 ENCOUNTER — Inpatient Hospital Stay: Payer: BLUE CROSS/BLUE SHIELD

## 2018-08-24 ENCOUNTER — Inpatient Hospital Stay (HOSPITAL_BASED_OUTPATIENT_CLINIC_OR_DEPARTMENT_OTHER): Payer: BLUE CROSS/BLUE SHIELD | Admitting: Nurse Practitioner

## 2018-08-24 VITALS — BP 139/82

## 2018-08-24 VITALS — BP 149/95 | HR 92 | Temp 99.1°F | Resp 19 | Ht 66.0 in | Wt 294.6 lb

## 2018-08-24 DIAGNOSIS — Z5111 Encounter for antineoplastic chemotherapy: Secondary | ICD-10-CM | POA: Insufficient documentation

## 2018-08-24 DIAGNOSIS — C787 Secondary malignant neoplasm of liver and intrahepatic bile duct: Secondary | ICD-10-CM

## 2018-08-24 DIAGNOSIS — I10 Essential (primary) hypertension: Secondary | ICD-10-CM | POA: Diagnosis not present

## 2018-08-24 DIAGNOSIS — D490 Neoplasm of unspecified behavior of digestive system: Secondary | ICD-10-CM

## 2018-08-24 DIAGNOSIS — C7800 Secondary malignant neoplasm of unspecified lung: Secondary | ICD-10-CM

## 2018-08-24 DIAGNOSIS — C50511 Malignant neoplasm of lower-outer quadrant of right female breast: Secondary | ICD-10-CM | POA: Diagnosis not present

## 2018-08-24 DIAGNOSIS — C187 Malignant neoplasm of sigmoid colon: Secondary | ICD-10-CM | POA: Diagnosis present

## 2018-08-24 DIAGNOSIS — Z5112 Encounter for antineoplastic immunotherapy: Secondary | ICD-10-CM | POA: Insufficient documentation

## 2018-08-24 DIAGNOSIS — R530 Neoplastic (malignant) related fatigue: Secondary | ICD-10-CM | POA: Insufficient documentation

## 2018-08-24 DIAGNOSIS — E876 Hypokalemia: Secondary | ICD-10-CM | POA: Insufficient documentation

## 2018-08-24 DIAGNOSIS — Z86711 Personal history of pulmonary embolism: Secondary | ICD-10-CM

## 2018-08-24 DIAGNOSIS — M545 Low back pain: Secondary | ICD-10-CM | POA: Insufficient documentation

## 2018-08-24 DIAGNOSIS — Z7901 Long term (current) use of anticoagulants: Secondary | ICD-10-CM

## 2018-08-24 DIAGNOSIS — Z95828 Presence of other vascular implants and grafts: Secondary | ICD-10-CM

## 2018-08-24 LAB — CMP (CANCER CENTER ONLY)
ALT: 39 U/L (ref 0–44)
AST: 56 U/L — ABNORMAL HIGH (ref 15–41)
Albumin: 3.3 g/dL — ABNORMAL LOW (ref 3.5–5.0)
Alkaline Phosphatase: 88 U/L (ref 38–126)
Anion gap: 11 (ref 5–15)
BUN: 11 mg/dL (ref 8–23)
CO2: 23 mmol/L (ref 22–32)
Calcium: 10.4 mg/dL — ABNORMAL HIGH (ref 8.9–10.3)
Chloride: 106 mmol/L (ref 98–111)
Creatinine: 0.89 mg/dL (ref 0.44–1.00)
GFR, Est AFR Am: >60 mL/min (ref >60)
GFR, Estimated: >60 mL/min (ref >60)
Glucose, Bld: 127 mg/dL — ABNORMAL HIGH (ref 70–99)
POTASSIUM: 3.8 mmol/L (ref 3.5–5.1)
Sodium: 140 mmol/L (ref 135–145)
Total Bilirubin: 0.5 mg/dL (ref 0.3–1.2)
Total Protein: 8.6 g/dL — ABNORMAL HIGH (ref 6.5–8.1)

## 2018-08-24 LAB — CBC WITH DIFFERENTIAL (CANCER CENTER ONLY)
Abs Immature Granulocytes: 0.02 10*3/uL (ref 0.00–0.07)
BASOS ABS: 0.1 10*3/uL (ref 0.0–0.1)
Basophils Relative: 1 %
Eosinophils Absolute: 0.2 10*3/uL (ref 0.0–0.5)
Eosinophils Relative: 2 %
HCT: 42.8 % (ref 36.0–46.0)
Hemoglobin: 13.5 g/dL (ref 12.0–15.0)
IMMATURE GRANULOCYTES: 0 %
Lymphocytes Relative: 25 %
Lymphs Abs: 2.5 10*3/uL (ref 0.7–4.0)
MCH: 29.9 pg (ref 26.0–34.0)
MCHC: 31.5 g/dL (ref 30.0–36.0)
MCV: 94.7 fL (ref 80.0–100.0)
Monocytes Absolute: 0.7 10*3/uL (ref 0.1–1.0)
Monocytes Relative: 7 %
NRBC: 0 % (ref 0.0–0.2)
Neutro Abs: 6.5 10*3/uL (ref 1.7–7.7)
Neutrophils Relative %: 65 %
Platelet Count: 282 10*3/uL (ref 150–400)
RBC: 4.52 MIL/uL (ref 3.87–5.11)
RDW: 16.7 % — ABNORMAL HIGH (ref 11.5–15.5)
WBC Count: 9.9 10*3/uL (ref 4.0–10.5)

## 2018-08-24 LAB — TOTAL PROTEIN, URINE DIPSTICK: Protein, ur: NEGATIVE mg/dL

## 2018-08-24 MED ORDER — SODIUM CHLORIDE 0.9 % IV SOLN
INTRAVENOUS | Status: DC
  Administered 2018-08-24: 13:00:00 via INTRAVENOUS
  Filled 2018-08-24: qty 250

## 2018-08-24 MED ORDER — PALONOSETRON HCL INJECTION 0.25 MG/5ML
INTRAVENOUS | Status: AC
  Filled 2018-08-24: qty 5

## 2018-08-24 MED ORDER — SODIUM CHLORIDE 0.9 % IV SOLN
Freq: Once | INTRAVENOUS | Status: AC
  Administered 2018-08-24: 13:00:00 via INTRAVENOUS
  Filled 2018-08-24: qty 5

## 2018-08-24 MED ORDER — ATROPINE SULFATE 1 MG/ML IJ SOLN
INTRAMUSCULAR | Status: AC
  Filled 2018-08-24: qty 1

## 2018-08-24 MED ORDER — SODIUM CHLORIDE 0.9 % IV SOLN
Freq: Once | INTRAVENOUS | Status: AC
  Administered 2018-08-24: 13:00:00 via INTRAVENOUS
  Filled 2018-08-24: qty 250

## 2018-08-24 MED ORDER — LEUCOVORIN CALCIUM INJECTION 350 MG
400.0000 mg/m2 | Freq: Once | INTRAVENOUS | Status: AC
  Administered 2018-08-24: 976 mg via INTRAVENOUS
  Filled 2018-08-24: qty 48.8

## 2018-08-24 MED ORDER — SODIUM CHLORIDE 0.9 % IV SOLN
700.0000 mg | Freq: Once | INTRAVENOUS | Status: AC
  Administered 2018-08-24: 700 mg via INTRAVENOUS
  Filled 2018-08-24: qty 16

## 2018-08-24 MED ORDER — PROCHLORPERAZINE MALEATE 10 MG PO TABS
ORAL_TABLET | ORAL | Status: AC
  Filled 2018-08-24: qty 1

## 2018-08-24 MED ORDER — PALONOSETRON HCL INJECTION 0.25 MG/5ML
0.2500 mg | Freq: Once | INTRAVENOUS | Status: AC
  Administered 2018-08-24: 0.25 mg via INTRAVENOUS

## 2018-08-24 MED ORDER — ATROPINE SULFATE 1 MG/ML IJ SOLN
0.5000 mg | Freq: Once | INTRAMUSCULAR | Status: AC | PRN
  Administered 2018-08-24: 0.5 mg via INTRAVENOUS

## 2018-08-24 MED ORDER — SODIUM CHLORIDE 0.9% FLUSH
10.0000 mL | INTRAVENOUS | Status: DC | PRN
  Administered 2018-08-24: 10 mL
  Filled 2018-08-24: qty 10

## 2018-08-24 MED ORDER — PROCHLORPERAZINE MALEATE 10 MG PO TABS
10.0000 mg | ORAL_TABLET | Freq: Once | ORAL | Status: AC | PRN
  Administered 2018-08-24: 10 mg via ORAL

## 2018-08-24 MED ORDER — FLUOROURACIL CHEMO INJECTION 2.5 GM/50ML
405.0000 mg/m2 | Freq: Once | INTRAVENOUS | Status: AC
  Administered 2018-08-24: 1000 mg via INTRAVENOUS
  Filled 2018-08-24: qty 20

## 2018-08-24 MED ORDER — SODIUM CHLORIDE 0.9% FLUSH
10.0000 mL | INTRAVENOUS | Status: DC | PRN
  Administered 2018-08-24: 10 mL via INTRAVENOUS
  Filled 2018-08-24: qty 10

## 2018-08-24 MED ORDER — IRINOTECAN HCL CHEMO INJECTION 100 MG/5ML
130.0000 mg/m2 | Freq: Once | INTRAVENOUS | Status: AC
  Administered 2018-08-24: 320 mg via INTRAVENOUS
  Filled 2018-08-24: qty 1

## 2018-08-24 MED ORDER — HEPARIN SOD (PORK) LOCK FLUSH 100 UNIT/ML IV SOLN
500.0000 [IU] | Freq: Once | INTRAVENOUS | Status: AC | PRN
  Administered 2018-08-24: 500 [IU]
  Filled 2018-08-24: qty 5

## 2018-08-24 NOTE — Progress Notes (Signed)
[place]  [phone] [phone]  Clinic Follow up Note   Patient Care Team: [name], MD as PCP - General (Internal Medicine) Leighton Ruff, MD as Consulting Physician (General Surgery) Carol Ada, MD as Consulting Physician (Gastroenterology) Concha Norway, MD (Inactive) as Consulting Physician (Internal Medicine) Emily Filbert, MD as Consulting Physician (Obstetrics and Gynecology) Tania Ade, RN as Registered Nurse (Medical Oncology) Stark Klein, MD as Consulting Physician (General Surgery) [name], MD as Consulting Physician (Hematology) 08/24/2018  SUMMARY OF ONCOLOGIC HISTORY: Oncology History   Breast cancer of lower-outer quadrant of right female breast [facility])   Staging form: Breast, AJCC 7th Edition     Clinical stage from 02/01/2015: Stage IA (T1a, N0, M0) - Signed by [name], MD on 06/04/2015     Pathologic stage from 11/10/2015: Stage IA (T1b, N0, M0) - Signed by [name], MD on 12/08/2015 Cancer of sigmoid colon [facility])   Staging form: Colon and Rectum, AJCC 7th Edition     Clinical: Stage I (T2, N0, M0) - Unsigned     Breast cancer of lower-outer quadrant of right female breast ([place])   2006 Cancer Diagnosis    Left breast DCIS diagnosed in 2006 and 2010, status post lumpectomy.    01/07/2014 Initial Diagnosis    Breast cancer    01/18/2015 Mammogram    45m mass in lower outer quadrant of right breast     02/01/2015 Initial Biopsy    (R) breast mass biopsy showed invasive ductal carcinoma & DCIS, grade 1-2.     02/01/2015 Receptors her2    ER 90%+, PR 10%+, Ki67 10%, HER2 negative (ratio 1.09)     11/07/2015 Surgery    (R) breast lumpectomy and sentinel lymph node biopsy ([name]    11/07/2015 Pathology Results    (R) breast lumpectomy showed invasive ductal carcinoma, grade 2, 1 cm, low-grade DCIS, negative margins, 3 sentinel lymph nodes were negative. LVI (-). HER2 repeated and remains negative (ratio  1.29).      11/07/2015 Pathologic Stage    pT1b,pN0: Stage IA     11/07/2015 Oncotype testing    RS 22, which predicts a year risk of distant recurrence with tamoxifen alone 14% (intermediate-risk). Adjuvant chemo was not offered    02/21/2016 - 04/10/2016 Radiation Therapy    Adjuvant breast radiation. ([name]). Right breast: 50.4 Gy in 28 fractions.  Right breast boost: 12 Gy in 6 fractions.    05/2016 -  Anti-estrogen oral therapy    Femara (Letrozole) 2.5 mg daily.     10/17/2016 Mammogram    MM DIAG BREAST TOMO BILATERAL 10/17/2016 FINDINGS: There are bilateral lumpectomy changes in the upper central left breast and upper central right breast. No mass, nonsurgical distortion, or suspicious microcalcification is identified in either breast to suggest malignancy. Mammographic images were processed with CAD. IMPRESSION: No evidence of malignancy in either breast. Lumpectomy changes bilaterally. RECOMMENDATION: Diagnostic mammogram is suggested in 1 year. (Code:[id number])    12/16/2017 Mammogram    IMPRESSION: No mammographic evidence of malignancy in either breast, status post bilateral mastectomies. RECOMMENDATION: Diagnostic mammogram is suggested in 1 year.      [place]moid colon [place]   03/04/2014 Initial Diagnosis    Cancer of sigmoid colon    03/09/2014 Pathology Results    G1 invasive adenocarcinoma, no lymph-Vascular invasion or Peri-neural invasion: Absent. MMR normal, MSI stable.     03/09/2014 Surgery    partial colectomy, negative margins.  11/10/2014 Relapse/Recurrence    biopsy confirmed oligo liver recurrence     11/10/2014 Imaging    PET showed two small ares of hypermetabolism in the right lobe of the liver, no other distant metastasis.     11/30/2014 Imaging    abdomen MRI showed a new enhancing lesion which corresponds to an area of abnormal hyper metabolism on recent PET-CT. Stable enlarged portal caval lymph node.     01/02/2015 Pathology Results     Liver biopsy showed metastatic adenocarcinoma, IHC (+) for CK20, CDX-2, (-) for TTF-1 and ER    01/02/2015 Miscellaneous    liver biopsy KRAS mutation (+)     02/14/2015 Imaging    CT showed:  the small metastatic right hepatic lobe liver lesion is not identified for certain on this examination. No new lesions. Stable small cysts in segment 4 a.     03/05/2015 - 04/16/2015 Chemotherapy    CAPEOX (capecitabine 2500 mg twice daily Day 1-14, oxaliplatin 1 30 mg/m on day 1, every 21 days, S/P 3 cycles     08/24/2015 Procedure    CT-guided oligo liver metastasis microwave ablation by Dr. [name]    06/17/2016 Imaging    CT of the chest/abd/pelvis with contrast 1. Several new small pulmonary nodules worrisome for metastatic disease. 2. New large area of probable infiltrating recurrent tumor in the right hepatic lobe surrounding the prior ablation site. 3. Small supraclavicular lymph nodes.  Attention on future studies. 4. Stable surgical changes involving both breasts. No breast masses are identified. 5. Stable bilateral adrenal gland nodules. 6. Stable upper abdominal and right-sided retroperitoneal lymph nodes.    07/08/2016 PET scan    1. Large mass in the right lobe of the liver demonstrates diffuse hypermetabolism, compatible with recurrent metastatic disease in the liver 2. Multiple small pulmonary nodules are very similar to the recent Chest CT 06/17/16.  3. No other findings of metastatic disease elsewhere in the neck, chest, abdomen, or pelvis. 4. There is some low-level hypermetabolic activity adjacent to the surgical clips in the lateral aspect of the right breast at the site of prior lumpectomy.     07/29/2016 Pathology Results    Liver biopsy confirmed metastatic colon cancer     07/31/2016 - 07/31/2017 Chemotherapy    Chemotherapy FOLFIRI and Avastin (from cycle 2) and Leucovorin (added from cycle 8) every 2 weeks, started on 07/31/2016, multiple delay per pt's request; chemo  break from 5/30-6/21/18; decreased leucovorin to 2000 mg/m2, irinotecan to 164m/m2, 5-fu 20046mm2 from 03/05/17, due to poor tolerance. Started chemo break since 07/31/17.     10/23/2016 Imaging    CT ABDOMEN PELVIS W CONTRAST 10/24/16 IMPRESSION: 1. The numerous tiny bilateral pulmonary nodules seen on the previous imaging exams are no longer evident, suggesting response of therapy. 2. Large ill-defined irregular right hepatic lesion measures smaller on today's study. 3. Slight increase in size of hepatoduodenal ligament lymphadenopathy. 4. Abdominal Aortic Atherosclerois (ICD10-170.0)    01/05/2017 Imaging    CT CAP IMPRESSION: 1. Left lower lobe pulmonary nodule and hepatic metastatic disease are stable. 2. Hepatoduodenal ligament lymph node is stable. 3. Aortic atherosclerosis (ICD10-170.0). Coronary artery calcification. 4. Enlarged pulmonary arteries, indicative of pulmonary arterial hypertension. 5. Small bilateral adrenal nodules are unchanged.    05/09/2017 Imaging    CT CAP IMPRESSION: 1. Stable appearance left lower lobe pulmonary nodule with an apparent new tiny posterior right upper lobe pulmonary nodule. 2. Dominant ill-defined right hepatic mass measures smaller on today's study. 3. Slight  decrease in the hepatoduodenal ligament lymph node. 4. Aortic Atherosclerois (ICD10-170.0)    05/09/2017 Imaging    CT CAP 05/09/17 IMPRESSION: 1. Stable appearance left lower lobe pulmonary nodule with an apparent new tiny posterior right upper lobe pulmonary nodule. Continued attention on follow-up recommended. 2. Dominant ill-defined right hepatic mass measures smaller on today's study. 3. Slight decrease in the hepatoduodenal ligament lymph node. 4.  Aortic Atherosclerois (ICD10-170.0)     08/11/2017 Imaging    CT CAP W Contrast 08/11/17 IMPRESSION: 1. Essentially stable appearance of small pulmonary nodules, hypodense mass laterally in the right hepatic lobe, and  mild portacaval adenopathy. No new or progressive lesions. 2. Other imaging findings of potential clinical significance: Aortic Atherosclerosis (ICD10-I70.0). Lower lumbar impingement due to spondylosis and degenerative disc disease. Uterine fibroids. Bilateral renal cysts. Coronary atherosclerosis. Mild mitral valve calcification. Mild cardiomegaly.      09/24/2017 - 12/04/2017 Chemotherapy    Maintenance Xeloda 2500 mg twice daily days 1 through 14 of each cycle of treatment and Avastin given every 3 weeks starting on 09/24/2017. Due to slight progression this was stopped on 12/04/17     12/02/2017 Imaging    CT CAP W Contrast 12/02/17 IMPRESSION: Chest Impression: 1. Interval increase in size of bilateral small pulmonary nodules. 2. No mediastinal adenopathy  Abdomen / Pelvis Impression: 1. Dominant lesion in the RIGHT hepatic lobe is similar; however there are 2 adjacent low-density lesions in the RIGHT hepatic lobe which are not appreciated on comparison exam and concerning recurrent hepatic metastasis. 2. Interval increase in size periaortic retroperitoneal lymph nodes consistent with recurrent metastatic adenopathy.    01/08/2018 -  Chemotherapy    Due to slight disease progression she restarted on Irinotecan plus Avastin every 2 weeks on 01/08/18. Added 5-Fu bolus and Leucovorin on 04/13/18.    04/09/2018 Imaging    04/09/2018 CT CAP W Contrast IMPRESSION: 1. Slight interval increase in size as well as development of new low-attenuation lesions within the liver. 2. Grossly unchanged bilateral pulmonary nodules. 3. Similar-appearing retroperitoneal adenopathy. 4. Small amount of gas within the endometrium of the uterus, nonspecific in etiology. Recommend clinical correlation for the possibility of infectious symptoms. Consider further evaluation with pelvic ultrasound as indicated.     06/07/2018 Imaging    06/07/2018 CT CAP IMPRESSION: Chest Impression:  1. Stable  bilateral pulmonary nodules. 2. Single new pulmonary nodule in the RIGHT lower lobe with ill-defined borders. Recommend short-term follow-up CT (6-8 weeks) as this may represent a focus of pulmonary infection.  Abdomen / Pelvis Impression:  1. Stable hepatic metastasis in the RIGHT hepatic lobe. No new lesions. 2. Small periaortic retroperitoneal lymph nodes are unchanged. 3. Sigmoid anastomosis without local recurrence. 4. Mild nodular contour of liver could indicate early cirrhosis.   PROBLEM LIST 1. Left breast DCIS diagnosed in 2006 and 2010, status post lumpectomy. 2. pT2N0M0 stage I colon cancer, s/p partial colectomy on 03/09/2014  3. PE in 12/2013 when she was diagnosed with colon cancer. 4. Right breast stage I breast cancer diagnosed in 01/2015  CURRENT TREATMENT:  1. Letrozole 2.5 mg once daily, started in September 2017 2. Irinotecan plus Avastin every 2 weeks started on 01/08/18, 5-fu bolus and leucovorin every 2 weeks added on 04/13/18    INTERVAL HISTORY: Ms. Heathcock returns for follow up and next cycle chemotherapy as scheduled. She completed last cycle on 11/6 where she received 5FU bolus/leucovorin/irinotecan and avastin. She was seen back in office on 11/20 and was given a short chemo  break due to increased fatigue and the holidays. She remains very fatigued and tearful, she doesn't like feeling so tired. This depresses her. She is mobile around the house but rests often. She can't lift her grandson. She is eating and drinking. Denies n/v/c/d, mucositis. Her low back pain has increased some, taking tylenol #4 more often. She does not want to escalate dose. Denies fever, chills, cough, chest pain, dyspnea, or neuropathy.    MEDICAL HISTORY:  Past Medical History:  Diagnosis Date  . Anxiety   . Breast CA ([place])    radiation and surgery-lt.  . Cancer ([place])    left breast cancer x2  . Hypertension   . Liver lesion   . Personal history of chemotherapy   .  Personal history of radiation therapy 2017  . Pulmonary embolism ([place])    "blood clot in lungs" -dx. 4'15 with CT Chest. On Xarelto  . Radiation 01/22/10-03/12/10   left whole breast 4500 cGy, upper aspect boosted to 6120 cGy  . Radiation 02/21/16 - 04/10/16   right breast 50.4 Gy, right breast boost 12 Gy  . Shortness of breath    sob with exertion. dx. with Pulmonary emboli 4'15 ,tx. Xarelto,Lovenox used for [employer].    SURGICAL HISTORY: Past Surgical History:  Procedure Laterality Date  . BREAST BIOPSY Left 2010   malignant  . BREAST BIOPSY Right 01/2015   malignant  . BREAST LUMPECTOMY Left 2006  . BREAST LUMPECTOMY Left 2010  . BREAST LUMPECTOMY Right 11/07/2015  . BREAST LUMPECTOMY WITH RADIOACTIVE SEED AND SENTINEL LYMPH NODE BIOPSY Right 11/07/2015   Procedure: BREAST LUMPECTOMY WITH RADIOACTIVE SEED AND SENTINEL LYMPH NODE BIOPSY;  Surgeon: Stark Klein, MD;  Location: [facility];  Service: General;  Laterality: Right;  . BREAST SURGERY     '11- Left breast lumpectomy  . CESAREAN SECTION    . CHOLECYSTECTOMY     Laparoscopic 20 yrs ago  . COLON SURGERY  03-09-14   partial colectomy for cancer  . IR GENERIC HISTORICAL  07/29/2016   IR FLUORO GUIDE PORT INSERTION RIGHT 07/29/2016 Sandi Mariscal, MD [facility]  . IR GENERIC HISTORICAL  07/29/2016   IR US GUIDE VASC ACCESS RIGHT 07/29/2016 Sandi Mariscal, MD [facility]  . IR GENERIC HISTORICAL  07/29/2016   IR US GUIDE BX ASP/DRAIN 07/29/2016 [facility]  . PARTIAL COLECTOMY N/A 03/09/2014   Procedure: LAPAROSCOPIC ASSISTED PARTIAL COLECTOMY, splenic flexure mobilization;  Surgeon: Leighton Ruff, MD;  Location: [facility];  Service: General;  Laterality: N/A;  . RADIOFREQUENCY ABLATION Right 08/24/2015   Procedure: MICROWAVE ABLATION RIGHT LIVER LOBE ;  Surgeon: Corrie Mckusick, DO;  Location: [facility];  Service: Anesthesiology;  Laterality: Right;    I have reviewed the social history and family history with the patient  and they are unchanged from previous note.  ALLERGIES:  is allergic to tramadol.  MEDICATIONS:  Current Outpatient Medications  Medication Sig Dispense Refill  . acetaminophen-codeine (TYLENOL #4) 300-60 MG tablet Take 1 tablet by mouth every 8 (eight) hours as needed. for pain 70 tablet 0  . amLODipine (NORVASC) 5 MG tablet Take 1 tablet (5 mg total) by mouth daily. 30 tablet 3  . ferrous sulfate 325 (65 FE) MG tablet Take 325 mg by mouth daily with breakfast. Pt takes  4 times per week.    . hydrochlorothiazide (HYDRODIURIL) 25 MG tablet TAKE 1 TABLET BY MOUTH EVERY IN THE MORNING 30 tablet 3  . letrozole (FEMARA) 2.5 MG tablet TAKE  1 TABLET BY MOUTH EVERY DAY 30 tablet 3  . lidocaine-prilocaine (EMLA) cream Apply 1 application topically as needed. 30 g 2  . LORazepam (ATIVAN) 0.5 MG tablet Take 1 tablet (0.5 mg total) by mouth every 8 (eight) hours. 30 tablet 0  . metoprolol tartrate (LOPRESSOR) 50 MG tablet Take 1 tablet (50 mg total) by mouth 2 (two) times daily. 60 tablet 1  . metoprolol tartrate (LOPRESSOR) 50 MG tablet TAKE 1 TABLET BY MOUTH 2 TIMES DAILY 60 tablet 2  . ondansetron (ZOFRAN) 8 MG tablet Take 1 tablet (8 mg total) by mouth 2 (two) times daily as needed for refractory nausea / vomiting. Start on day 3 after chemotherapy. 30 tablet 1  . potassium chloride SA (K-DUR,KLOR-CON) 20 MEQ tablet TAKE 1 TABLET BY MOUTH 2 TIMES DAILY 60 tablet 2  . prochlorperazine (COMPAZINE) 10 MG tablet Take 1 tablet (10 mg total) by mouth every 6 (six) hours as needed (NAUSEA). 30 tablet 2  . promethazine (PHENERGAN) 25 MG tablet Take 1 tablet (25 mg total) by mouth every 6 (six) hours as needed for nausea or vomiting. 30 tablet 2  . XARELTO 20 MG TABS tablet TAKE 1 TABLET BY MOUTH EVERY DAY 30 tablet 3  . zolpidem (AMBIEN) 5 MG tablet Take 1 tablet (5 mg total) by mouth at bedtime as needed for sleep. 20 tablet 0   No current facility-administered medications for this visit.     Facility-Administered Medications Ordered in Other Visits  Medication Dose Route Frequency Provider Last Rate Last Dose  . 0.9 %  sodium chloride infusion   Intravenous Continuous [name], MD 20 mL/hr at 08/24/18 1243    . fluorouracil (ADRUCIL) chemo injection 1,000 mg  405 mg/m2 (Treatment Plan Recorded) Intravenous Once [name], MD      . heparin lock flush 100 unit/mL  500 Units Intracatheter Once PRN [name], MD      . irinotecan (CAMPTOSAR) 320 mg in dextrose 5 % 500 mL chemo infusion  130 mg/m2 (Treatment Plan Recorded) Intravenous Once [name], MD 344 mL/hr at 08/24/18 1408 320 mg at 08/24/18 1408  . leucovorin 976 mg in dextrose 5 % 250 mL infusion  400 mg/m2 (Treatment Plan Recorded) Intravenous Once [name], MD 199 mL/hr at 08/24/18 1409 976 mg at 08/24/18 1409  . sodium chloride flush (NS) 0.9 % injection 10 mL  10 mL Intracatheter PRN [name], MD        PHYSICAL EXAMINATION: ECOG PERFORMANCE STATUS: 1-2  Vitals:   08/24/18 1051  BP: (!) 149/95  Pulse: 92  Resp: 19  Temp: 99.1 F (37.3 C)  SpO2: 97%   Filed Weights   08/24/18 1051  Weight: 294 lb 9.6 oz (133.6 kg)    GENERAL:alert, no distress and comfortable SKIN: no rashes or significant lesions EYES: sclera clear OROPHARYNX:no thrush or ulcers LYMPH:  no palpable cervical or supraclavicular lymphadenopathy  LUNGS: clear to auscultation with normal breathing effort HEART: regular rate & rhythm, no lower extremity edema ABDOMEN:abdomen soft, non-tender and normal bowel sounds NEURO: alert & oriented x 3 with fluent speech, no focal motor deficits PAC without erythema    LABORATORY DATA:  I have reviewed the data as listed CBC Latest Ref Rng & Units 08/24/2018 08/04/2018 07/21/2018  WBC 4.0 - 10.5 K/uL 9.9 7.8 8.4  Hemoglobin 12.0 - 15.0 g/dL 13.5 13.2 13.1  Hematocrit 36.0 - 46.0 % 42.8 40.6 41.2  Platelets 150 - 400 K/uL 282 256 264  CMP Latest Ref Rng & Units 08/24/2018 08/04/2018  07/21/2018  Glucose 70 - 99 mg/dL 127(H) 137(H) 155(H)  BUN 8 - 23 mg/dL _0 Creatinine 0.44 - 1.00 mg/dL 0.89 0.85 0.88  Sodium 135 - 145 mmol/L 140 140 140  Potassium 3.5 - 5.1 mmol/L 3.8 3.9 4.0  Chloride 98 - 111 mmol/L 106 103 103  CO2 22 - 32 mmol/L _1 Calcium 8.9 - 10.3 mg/dL 10.4(H) 9.9 10.0  Total Protein 6.5 - 8.1 g/dL 8.6(H) 7.8 7.7  Total Bilirubin 0.3 - 1.2 mg/dL 0.5 0.5 0.4  Alkaline Phos 38 - 126 U/L 88 63 64  AST 15 - 41 U/L 56(H) 51(H) 38  ALT 0 - 44 U/L 39 34 31      RADIOGRAPHIC STUDIES: I have personally reviewed the radiological images as listed and agreed with the findings in the report. No results found.   ASSESSMENT & PLAN: JAHNAE MCAD[age] y.o.female  1. Colon Cancer, Stage I (pT2N0), MMR normal, oligo recurrent disease in liver in 12/2014, KRAS mutation (+), liver and lung recurrence in 06/2016 2. Right breast cancer, pT1bN0M0, stage Ia, ER+/PR+/HER2-, History of left Breast Cancer, diagnosed on 02/01/2015- Oncotype Dx 22, intermediate risk, adjuvant chemo not recommended; currently on letrozole 3. H/o PE (01/06/14) - on xarelto  4. Uterine fibroids 5. Right kidney lesion  6. Right flank pain, knee discomfort  7. Morbid obesity 8. HTN 9. Hypokalemia -on oral potassium supplement  10. Bilateral lower extremity swelling, left knee swelling 11. Left abdominal wall abscess - completed antibiotics, resolved  12. Fatigue, secondary to malignancy and cancer treatment, multifactorial  Ms. Wille appears stable. She completed another cycle of 5FU bolus/leuc/irinotecan and avastin. She tolerates chemo moderately well, except significant fatigue. She has not recovered completely today, but agrees to continue treatment. Per goals of care discussion, she does not want to discontinue treatment altogether. As previously discussed with Dr. Burr Medico, she wants to change her treatment to q3 weeks. She desires treatment break over the holidays.  In the meantime I encouraged her to remain mobile and active, and eat/drink well. She agrees.   She has increased pain medication frequency lately, but still feels this regimen is adequate. She is hesitant to escalate pain meds. Continue monitoring.   Labs reviewed, CBC and CMP adequate to proceed with next cycle today. For mild hypercalcemia I recommend she increase po hydration. No need for zometa at this point. Will obtain restaging CT CAP before next visit, ordered today. F/u in 4 weeks, after short treatment break, to review scans and resume treatment.   PLAN: -Labs reviewed, proceed with next cycle today -Chemo break, return 1/7 or 1/8 to review scans and resume treatment -CT CAP before next cycle   Orders Placed This Encounter  Procedures  . CT Abdomen Pelvis W Contrast    Standing Status:   Future    Standing Expiration Date:   08/24/2019    Order Specific Question:   If indicated for the ordered procedure, I authorize the administration of contrast media per Radiology protocol    Answer:   Yes    Order Specific Question:   Preferred imaging location?    Answer:   [facility]    Order Specific Question:   Is Oral Contrast requested for this exam?    Answer:   Yes, Per Radiology protocol    Order Specific Question:   Radiology Contrast Protocol - do NOT remove file path  Answer:   \\[email or web address]  . CT Chest W Contrast    Standing Status:   Future    Standing Expiration Date:   08/24/2019    Order Specific Question:   If indicated for the ordered procedure, I authorize the administration of contrast media per Radiology protocol    Answer:   Yes    Order Specific Question:   Preferred imaging location?    Answer:   [facility]    Order Specific Question:   Radiology Contrast Protocol - do NOT remove file path    Answer:   \\[email or web address]   All questions were answered. The patient knows to call the  clinic with any problems, questions or concerns. No barriers to learning was detected.    Alla Feeling, NP 08/24/18

## 2018-08-24 NOTE — Patient Instructions (Signed)
[place] Discharge Instructions for Patients Receiving Chemotherapy  Today you received the following chemotherapy agents: Bevacizumab (Avastin), Irinotecan (Camptosar), Leucovorin, and Fluorouracil (Adrucil, 5-FU)  To help prevent nausea and vomiting after your treatment, we encourage you to take your nausea medication as directed. Received Aloxi during treatment today-->Take Compazine or Phenergan (not Zofran) for the next 3 days as needed.    If you develop nausea and vomiting that is not controlled by your nausea medication, call the clinic.   BELOW ARE SYMPTOMS THAT SHOULD BE REPORTED IMMEDIATELY:  *FEVER GREATER THAN 100.5 F  *CHILLS WITH OR WITHOUT FEVER  NAUSEA AND VOMITING THAT IS NOT CONTROLLED WITH YOUR NAUSEA MEDICATION  *UNUSUAL SHORTNESS OF BREATH  *UNUSUAL BRUISING OR BLEEDING  TENDERNESS IN MOUTH AND THROAT WITH OR WITHOUT PRESENCE OF ULCERS  *URINARY PROBLEMS  *BOWEL PROBLEMS  UNUSUAL RASH Items with * indicate a potential emergency and should be followed up as soon as possible.  Feel free to call the clinic should you have any questions or concerns. The clinic phone number is [phone].  Please show the [place] at check-in to the Emergency Department and triage nurse.

## 2018-08-25 ENCOUNTER — Telehealth: Payer: Self-pay | Admitting: Hematology

## 2018-08-25 ENCOUNTER — Telehealth: Payer: Self-pay | Admitting: Emergency Medicine

## 2018-08-25 NOTE — Telephone Encounter (Signed)
Printed and mailed calendar. °

## 2018-08-25 NOTE — Telephone Encounter (Addendum)
Scan scheduled. Pt verbalized understanding of scan date/time/instructions   ----- Message from Alla Feeling, NP sent at 08/25/2018  8:38 AM EST ----- Thanks, please schedule CT before next f/u and chemo on 1/8. Regan Rakers   ----- Message ----- From: Gaspar Bidding Sent: 08/25/2018   6:13 AM EST To: April H Pait, Alla Feeling, NP, #  Referral authorized ----- Message ----- From: Alla Feeling, NP Sent: 08/24/2018   3:02 PM EST To: Sheilah Mins, LPN, #  I ordered restaging CT CAP today, please help to get this approved and scheduled before her next appt in 4 weeks.   Thanks,  Regan Rakers

## 2018-09-01 ENCOUNTER — Other Ambulatory Visit: Payer: BLUE CROSS/BLUE SHIELD

## 2018-09-01 ENCOUNTER — Ambulatory Visit: Payer: BLUE CROSS/BLUE SHIELD | Admitting: Hematology

## 2018-09-01 ENCOUNTER — Ambulatory Visit: Payer: BLUE CROSS/BLUE SHIELD

## 2018-09-07 ENCOUNTER — Ambulatory Visit: Payer: BLUE CROSS/BLUE SHIELD | Admitting: Hematology

## 2018-09-07 ENCOUNTER — Ambulatory Visit: Payer: BLUE CROSS/BLUE SHIELD

## 2018-09-07 ENCOUNTER — Other Ambulatory Visit: Payer: BLUE CROSS/BLUE SHIELD

## 2018-09-09 ENCOUNTER — Other Ambulatory Visit: Payer: Self-pay | Admitting: Hematology

## 2018-09-09 ENCOUNTER — Other Ambulatory Visit: Payer: Self-pay | Admitting: Nurse Practitioner

## 2018-09-09 ENCOUNTER — Telehealth: Payer: Self-pay

## 2018-09-09 DIAGNOSIS — C187 Malignant neoplasm of sigmoid colon: Secondary | ICD-10-CM

## 2018-09-09 MED ORDER — ACETAMINOPHEN-CODEINE #4 300-60 MG PO TABS: 1.0000 | ORAL_TABLET | Freq: Three times a day (TID) | ORAL | 0 refills | Status: DC | PRN

## 2018-09-09 NOTE — Telephone Encounter (Signed)
Done, thanks   Brasen Bundren MD  

## 2018-09-09 NOTE — Telephone Encounter (Signed)
Patient calls needing refill for Tylenol #4.

## 2018-09-21 ENCOUNTER — Encounter (HOSPITAL_COMMUNITY): Payer: Self-pay

## 2018-09-21 ENCOUNTER — Ambulatory Visit (HOSPITAL_COMMUNITY)
Admission: RE | Admit: 2018-09-21 | Discharge: 2018-09-21 | Disposition: A | Discharge status: Home / Self Care | Payer: BLUE CROSS/BLUE SHIELD | Source: Ambulatory Visit | Attending: Nurse Practitioner | Admitting: Nurse Practitioner

## 2018-09-21 DIAGNOSIS — D490 Neoplasm of unspecified behavior of digestive system: Secondary | ICD-10-CM | POA: Diagnosis not present

## 2018-09-21 MED ORDER — SODIUM CHLORIDE (PF) 0.9 % IJ SOLN
INTRAMUSCULAR | Status: AC
  Filled 2018-09-21: qty 50

## 2018-09-21 MED ORDER — IOHEXOL 300 MG/ML  SOLN
100.0000 mL | Freq: Once | INTRAMUSCULAR | Status: AC | PRN
  Administered 2018-09-21: 100 mL via INTRAVENOUS

## 2018-09-22 ENCOUNTER — Telehealth: Payer: Self-pay

## 2018-09-22 ENCOUNTER — Inpatient Hospital Stay: Payer: BLUE CROSS/BLUE SHIELD

## 2018-09-22 ENCOUNTER — Inpatient Hospital Stay: Payer: BLUE CROSS/BLUE SHIELD | Admitting: Hematology

## 2018-09-22 ENCOUNTER — Telehealth: Payer: Self-pay | Admitting: Hematology

## 2018-09-22 NOTE — Telephone Encounter (Signed)
Patient calls stating emergency came up and she has no transportation to come for today's appointments would like to reschedule for next Tuesday or Wednesday, scheduling message was sent.

## 2018-09-22 NOTE — Telephone Encounter (Signed)
Called patient per 1/8 sch message - r/s appt to next week - pt husband is aware of appt date and time

## 2018-09-24 ENCOUNTER — Other Ambulatory Visit: Payer: Self-pay | Admitting: Hematology

## 2018-09-24 ENCOUNTER — Other Ambulatory Visit: Payer: Self-pay | Admitting: Nurse Practitioner

## 2018-09-24 DIAGNOSIS — C50511 Malignant neoplasm of lower-outer quadrant of right female breast: Secondary | ICD-10-CM

## 2018-09-24 DIAGNOSIS — Z170 Estrogen receptor positive status [ER+]: Principal | ICD-10-CM

## 2018-09-24 DIAGNOSIS — C187 Malignant neoplasm of sigmoid colon: Secondary | ICD-10-CM

## 2018-09-27 ENCOUNTER — Other Ambulatory Visit: Payer: Self-pay | Admitting: Hematology

## 2018-09-27 ENCOUNTER — Telehealth: Payer: Self-pay

## 2018-09-27 DIAGNOSIS — C187 Malignant neoplasm of sigmoid colon: Secondary | ICD-10-CM

## 2018-09-27 MED ORDER — ACETAMINOPHEN-CODEINE #4 300-60 MG PO TABS: 1.0000 | ORAL_TABLET | Freq: Four times a day (QID) | ORAL | 0 refills | Status: DC | PRN

## 2018-09-27 NOTE — Progress Notes (Signed)
Veronica Reyes   [phone] [phone]   Clinic Follow up Note   Patient Care Team: [name], MD as PCP - General (Internal Medicine) Leighton Ruff, MD as Consulting Physician (General Surgery) Carol Ada, MD as Consulting Physician (Gastroenterology) Concha Norway, MD (Inactive) as Consulting Physician (Internal Medicine) Emily Filbert, MD as Consulting Physician (Obstetrics and Gynecology) Stark Klein, MD as Consulting Physician (General Surgery) [name], MD as Consulting Physician (Hematology) Jonnie Finner, RN as Registered Nurse  Date of Service:  09/29/2018  CHIEF COMPLAINT: F/u of colon cancer and h/o of breast cancer  SUMMARY OF ONCOLOGIC HISTORY: Oncology History   Breast cancer of lower-outer quadrant of right female breast [facility])   Staging form: Breast, AJCC 7th Edition     Clinical stage from 02/01/2015: Stage IA (T1a, N0, M0) - Signed by [name], MD on 06/04/2015     Pathologic stage from 11/10/2015: Stage IA (T1b, N0, M0) - Signed by [name], MD on 12/08/2015 Cancer of sigmoid colon [facility])   Staging form: Colon and Rectum, AJCC 7th Edition     Clinical: Stage I (T2, N0, M0) - Unsigned     Breast cancer of lower-outer quadrant of right female breast ([place])   2006 Cancer Diagnosis    Left breast DCIS diagnosed in 2006 and 2010, status post lumpectomy.    01/07/2014 Initial Diagnosis    Breast cancer    01/18/2015 Mammogram    52m mass in lower outer quadrant of right breast     02/01/2015 Initial Biopsy    (R) breast mass biopsy showed invasive ductal carcinoma & DCIS, grade 1-2.     02/01/2015 Receptors her2    ER 90%+, PR 10%+, Ki67 10%, HER2 negative (ratio 1.09)     11/07/2015 Surgery    (R) breast lumpectomy and sentinel lymph node biopsy ([name]    11/07/2015 Pathology Results    (R) breast lumpectomy showed invasive ductal carcinoma, grade 2, 1 cm, low-grade DCIS, negative margins, 3 sentinel lymph nodes  were negative. LVI (-). HER2 repeated and remains negative (ratio 1.29).      11/07/2015 Pathologic Stage    pT1b,pN0: Stage IA     11/07/2015 Oncotype testing    RS 22, which predicts a year risk of distant recurrence with tamoxifen alone 14% (intermediate-risk). Adjuvant chemo was not offered    02/21/2016 - 04/10/2016 Radiation Therapy    Adjuvant breast radiation. ([name]). Right breast: 50.4 Gy in 28 fractions.  Right breast boost: 12 Gy in 6 fractions.    05/2016 -  Anti-estrogen oral therapy    Femara (Letrozole) 2.5 mg daily.     10/17/2016 Mammogram    MM DIAG BREAST TOMO BILATERAL 10/17/2016 FINDINGS: There are bilateral lumpectomy changes in the upper central left breast and upper central right breast. No mass, nonsurgical distortion, or suspicious microcalcification is identified in either breast to suggest malignancy. Mammographic images were processed with CAD. IMPRESSION: No evidence of malignancy in either breast. Lumpectomy changes bilaterally. RECOMMENDATION: Diagnostic mammogram is suggested in 1 year. (Code:[id number])    12/16/2017 Mammogram    IMPRESSION: No mammographic evidence of malignancy in either breast, status post bilateral mastectomies. RECOMMENDATION: Diagnostic mammogram is suggested in 1 year.      [place]   03/04/2014 Initial Diagnosis    Cancer of sigmoid colon    03/09/2014 Pathology Results    G1 invasive adenocarcinoma, no lymph-Vascular invasion or Peri-neural invasion: Absent. MMR normal, MSI  stable.     03/09/2014 Surgery    partial colectomy, negative margins.     11/10/2014 Relapse/Recurrence    biopsy confirmed oligo liver recurrence     11/10/2014 Imaging    PET showed two small ares of hypermetabolism in the right lobe of the liver, no other distant metastasis.     11/30/2014 Imaging    abdomen MRI showed a new enhancing lesion which corresponds to an area of abnormal hyper metabolism on recent PET-CT. Stable enlarged  portal caval lymph node.     01/02/2015 Pathology Results    Liver biopsy showed metastatic adenocarcinoma, IHC (+) for CK20, CDX-2, (-) for TTF-1 and ER    01/02/2015 Miscellaneous    liver biopsy KRAS mutation (+)     02/14/2015 Imaging    CT showed:  the small metastatic right hepatic lobe liver lesion is not identified for certain on this examination. No new lesions. Stable small cysts in segment 4 a.     03/05/2015 - 04/16/2015 Chemotherapy    CAPEOX (capecitabine 2500 mg twice daily Day 1-14, oxaliplatin 1 30 mg/m on day 1, every 21 days, S/P 3 cycles     08/24/2015 Procedure    CT-guided oligo liver metastasis microwave ablation by Dr. [name]    06/17/2016 Imaging    CT of the chest/abd/pelvis with contrast 1. Several new small pulmonary nodules worrisome for metastatic disease. 2. New large area of probable infiltrating recurrent tumor in the right hepatic lobe surrounding the prior ablation site. 3. Small supraclavicular lymph nodes.  Attention on future studies. 4. Stable surgical changes involving both breasts. No breast masses are identified. 5. Stable bilateral adrenal gland nodules. 6. Stable upper abdominal and right-sided retroperitoneal lymph nodes.    07/08/2016 PET scan    1. Large mass in the right lobe of the liver demonstrates diffuse hypermetabolism, compatible with recurrent metastatic disease in the liver 2. Multiple small pulmonary nodules are very similar to the recent Chest CT 06/17/16.  3. No other findings of metastatic disease elsewhere in the neck, chest, abdomen, or pelvis. 4. There is some low-level hypermetabolic activity adjacent to the surgical clips in the lateral aspect of the right breast at the site of prior lumpectomy.     07/29/2016 Pathology Results    Liver biopsy confirmed metastatic colon cancer     07/31/2016 - 07/31/2017 Chemotherapy    Chemotherapy FOLFIRI and Avastin (from cycle 2) and Leucovorin (added from cycle 8) every 2 weeks,  started on 07/31/2016, multiple delay per pt's request; chemo break from 5/30-6/21/18; decreased leucovorin to 2000 mg/m2, irinotecan to 171m/m2, 5-fu 20042mm2 from 03/05/17, due to poor tolerance. Started chemo break since 07/31/17.     10/23/2016 Imaging    CT ABDOMEN PELVIS W CONTRAST 10/24/16 IMPRESSION: 1. The numerous tiny bilateral pulmonary nodules seen on the previous imaging exams are no longer evident, suggesting response of therapy. 2. Large ill-defined irregular right hepatic lesion measures smaller on today's study. 3. Slight increase in size of hepatoduodenal ligament lymphadenopathy. 4. Abdominal Aortic Atherosclerois (ICD10-170.0)    01/05/2017 Imaging    CT CAP IMPRESSION: 1. Left lower lobe pulmonary nodule and hepatic metastatic disease are stable. 2. Hepatoduodenal ligament lymph node is stable. 3. Aortic atherosclerosis (ICD10-170.0). Coronary artery calcification. 4. Enlarged pulmonary arteries, indicative of pulmonary arterial hypertension. 5. Small bilateral adrenal nodules are unchanged.    05/09/2017 Imaging    CT CAP IMPRESSION: 1. Stable appearance left lower lobe pulmonary nodule with an apparent new tiny posterior  right upper lobe pulmonary nodule. 2. Dominant ill-defined right hepatic mass measures smaller on today's study. 3. Slight decrease in the hepatoduodenal ligament lymph node. 4. Aortic Atherosclerois (ICD10-170.0)    05/09/2017 Imaging    CT CAP 05/09/17 IMPRESSION: 1. Stable appearance left lower lobe pulmonary nodule with an apparent new tiny posterior right upper lobe pulmonary nodule. Continued attention on follow-up recommended. 2. Dominant ill-defined right hepatic mass measures smaller on today's study. 3. Slight decrease in the hepatoduodenal ligament lymph node. 4.  Aortic Atherosclerois (ICD10-170.0)     08/11/2017 Imaging    CT CAP W Contrast 08/11/17 IMPRESSION: 1. Essentially stable appearance of small pulmonary  nodules, hypodense mass laterally in the right hepatic lobe, and mild portacaval adenopathy. No new or progressive lesions. 2. Other imaging findings of potential clinical significance: Aortic Atherosclerosis (ICD10-I70.0). Lower lumbar impingement due to spondylosis and degenerative disc disease. Uterine fibroids. Bilateral renal cysts. Coronary atherosclerosis. Mild mitral valve calcification. Mild cardiomegaly.      09/24/2017 - 12/04/2017 Chemotherapy    Maintenance Xeloda 2500 mg twice daily days 1 through 14 of each cycle of treatment and Avastin given every 3 weeks starting on 09/24/2017. Due to slight progression this was stopped on 12/04/17     12/02/2017 Imaging    CT CAP W Contrast 12/02/17 IMPRESSION: Chest Impression: 1. Interval increase in size of bilateral small pulmonary nodules. 2. No mediastinal adenopathy  Abdomen / Pelvis Impression: 1. Dominant lesion in the RIGHT hepatic lobe is similar; however there are 2 adjacent low-density lesions in the RIGHT hepatic lobe which are not appreciated on comparison exam and concerning recurrent hepatic metastasis. 2. Interval increase in size periaortic retroperitoneal lymph nodes consistent with recurrent metastatic adenopathy.    01/08/2018 - 08/24/2018 Chemotherapy    Due to slight disease progression she restarted on Irinotecan plus Avastin every 2 weeks on 01/08/18. Added 5-Fu bolus and Leucovorin on 04/13/18. Stopped on 08/24/18 due to disease progression.     04/09/2018 Imaging    04/09/2018 CT CAP W Contrast IMPRESSION: 1. Slight interval increase in size as well as development of new low-attenuation lesions within the liver. 2. Grossly unchanged bilateral pulmonary nodules. 3. Similar-appearing retroperitoneal adenopathy. 4. Small amount of gas within the endometrium of the uterus, nonspecific in etiology. Recommend clinical correlation for the possibility of infectious symptoms. Consider further evaluation with pelvic  ultrasound as indicated.     06/07/2018 Imaging    06/07/2018 CT CAP IMPRESSION: Chest Impression:  1. Stable bilateral pulmonary nodules. 2. Single new pulmonary nodule in the RIGHT lower lobe with ill-defined borders. Recommend short-term follow-up CT (6-8 weeks) as this may represent a focus of pulmonary infection.  Abdomen / Pelvis Impression:  1. Stable hepatic metastasis in the RIGHT hepatic lobe. No new lesions. 2. Small periaortic retroperitoneal lymph nodes are unchanged. 3. Sigmoid anastomosis without local recurrence. 4. Mild nodular contour of liver could indicate early cirrhosis.     Chemotherapy    PENDING Third-line Lonsurf M-F 2 weeks on, 2 weeks off       CURRENT THERAPY:  1. Letrozole 2.5 mg once daily, started in September 2017 2. PENDING Third-line Lonsurf M-F 2 weeks on, 2 weeks off  INTERVAL HISTORY:  Veronica Reyes is here for a follow up. She presents to the clinic today by herself today. She notes she has not been feeling well lately. She notes pain in her left groin. She has been taking Tylenol #4 which has relieved this. She notes still having back  pain and right flank pain. She notes having Constipation lately.  She has not been able to do much and walking is a task for her. She has been ambulating with cane or wheelchair. She has been trying to exercise her hands and feet.  She has help with her husband and daughter at home. She notes she was suppose to go on cruise tomorrow but her husband will go with a friend because she does not feel up to it. She would like to wait to tell him after trip. She is willing to talk to Center today as she is very emotional about the news.      REVIEW OF SYSTEMS:   Constitutional: Denies fevers, chills or abnormal weight loss Eyes: Denies blurriness of vision Ears, nose, mouth, throat, and face: Denies mucositis or sore throat Respiratory: Denies cough, dyspnea or wheezes Cardiovascular: Denies  palpitation, chest discomfort or lower extremity swelling Gastrointestinal:  Denies nausea, heartburn (+) Constipation  Skin: Denies abnormal skin rashes MSK: (+) Back and right flank pain  Lymphatics: Denies new lymphadenopathy or easy bruising Neurological:Denies numbness, tingling or new weaknesses Behavioral/Psych: Mood is stable, no new changes (+) emotional/depressed All other systems were reviewed with the patient and are negative.  MEDICAL HISTORY:  Past Medical History:  Diagnosis Date  . Anxiety   . Breast CA ([place])    radiation and surgery-lt.  . Cancer ([place])    left breast cancer x2  . Hypertension   . Liver lesion   . Personal history of chemotherapy   . Personal history of radiation therapy 2017  . Pulmonary embolism ([place])    "blood clot in lungs" -dx. 4'15 with CT Chest. On Xarelto  . Radiation 01/22/10-03/12/10   left whole breast 4500 cGy, upper aspect boosted to 6120 cGy  . Radiation 02/21/16 - 04/10/16   right breast 50.4 Gy, right breast boost 12 Gy  . Shortness of breath    sob with exertion. dx. with Pulmonary emboli 4'15 ,tx. Xarelto,Lovenox used for [employer].    SURGICAL HISTORY: Past Surgical History:  Procedure Laterality Date  . BREAST BIOPSY Left 2010   malignant  . BREAST BIOPSY Right 01/2015   malignant  . BREAST LUMPECTOMY Left 2006  . BREAST LUMPECTOMY Left 2010  . BREAST LUMPECTOMY Right 11/07/2015  . BREAST LUMPECTOMY WITH RADIOACTIVE SEED AND SENTINEL LYMPH NODE BIOPSY Right 11/07/2015   Procedure: BREAST LUMPECTOMY WITH RADIOACTIVE SEED AND SENTINEL LYMPH NODE BIOPSY;  Surgeon: Stark Klein, MD;  Location: [facility];  Service: General;  Laterality: Right;  . BREAST SURGERY     '11- Left breast lumpectomy  . CESAREAN SECTION    . CHOLECYSTECTOMY     Laparoscopic 20 yrs ago  . COLON SURGERY  03-09-14   partial colectomy for cancer  . IR GENERIC HISTORICAL  07/29/2016   IR FLUORO GUIDE PORT INSERTION RIGHT 07/29/2016 Sandi Mariscal, MD [facility]  . IR GENERIC HISTORICAL  07/29/2016   IR US GUIDE VASC ACCESS RIGHT 07/29/2016 Sandi Mariscal, MD [facility]  . IR GENERIC HISTORICAL  07/29/2016   IR US GUIDE BX ASP/DRAIN 07/29/2016 [facility]  . PARTIAL COLECTOMY N/A 03/09/2014   Procedure: LAPAROSCOPIC ASSISTED PARTIAL COLECTOMY, splenic flexure mobilization;  Surgeon: Leighton Ruff, MD;  Location: [facility];  Service: General;  Laterality: N/A;  . RADIOFREQUENCY ABLATION Right 08/24/2015   Procedure: MICROWAVE ABLATION RIGHT LIVER LOBE ;  Surgeon: Corrie Mckusick, DO;  Location: [facility];  Service: Anesthesiology;  Laterality: Right;  I have reviewed the social history and family history with the patient and they are unchanged from previous note.  ALLERGIES:  is allergic to tramadol.  MEDICATIONS:  Current Outpatient Medications  Medication Sig Dispense Refill  . acetaminophen-codeine (TYLENOL #4) 300-60 MG tablet Take 1 tablet by mouth every 6 (six) hours as needed. for pain 90 tablet 0  . amLODipine (NORVASC) 5 MG tablet Take 1 tablet (5 mg total) by mouth daily. 30 tablet 3  . ferrous sulfate 325 (65 FE) MG tablet Take 325 mg by mouth daily with breakfast. Pt takes  4 times per week.    . hydrochlorothiazide (HYDRODIURIL) 25 MG tablet TAKE 1 TABLET BY MOUTH EVERY IN THE MORNING 30 tablet 3  . letrozole (FEMARA) 2.5 MG tablet TAKE 1 TABLET BY MOUTH EVERY DAY 30 tablet 3  . lidocaine-prilocaine (EMLA) cream Apply 1 application topically as needed. 30 g 2  . LORazepam (ATIVAN) 0.5 MG tablet Take 1 tablet (0.5 mg total) by mouth every 8 (eight) hours. 30 tablet 0  . metoprolol tartrate (LOPRESSOR) 50 MG tablet Take 1 tablet (50 mg total) by mouth 2 (two) times daily. 60 tablet 1  . metoprolol tartrate (LOPRESSOR) 50 MG tablet TAKE 1 TABLET BY MOUTH 2 TIMES DAILY 60 tablet 2  . ondansetron (ZOFRAN) 8 MG tablet Take 1 tablet (8 mg total) by mouth 2 (two) times daily as needed for refractory nausea / vomiting. Start on  day 3 after chemotherapy. 30 tablet 1  . potassium chloride SA (K-DUR,KLOR-CON) 20 MEQ tablet TAKE 1 TABLET BY MOUTH 2 TIMES DAILY 60 tablet 2  . prochlorperazine (COMPAZINE) 10 MG tablet Take 1 tablet (10 mg total) by mouth every 6 (six) hours as needed (NAUSEA). 30 tablet 2  . promethazine (PHENERGAN) 25 MG tablet Take 1 tablet (25 mg total) by mouth every 6 (six) hours as needed for nausea or vomiting. 30 tablet 2  . XARELTO 20 MG TABS tablet TAKE 1 TABLET BY MOUTH EVERY DAY 30 tablet 3  . zolpidem (AMBIEN) 5 MG tablet Take 1 tablet (5 mg total) by mouth at bedtime as needed for sleep. 20 tablet 0  . trifluridine-tipiracil (LONSURF) 20-8.19 MG tablet Take 4 tablets (80 mg of trifluridine total) by mouth 2 (two) times daily after a meal. 1 hr after AM & PM meals on days 1-5, 8-12. Repeat every 28day 80 tablet 2   No current facility-administered medications for this visit.     PHYSICAL EXAMINATION: ECOG PERFORMANCE STATUS: 2 - Symptomatic, <50% confined to bed  Vitals:   09/29/18 1123  BP: (!) 152/95  Pulse: 88  Resp: 18  Temp: 97.9 F (36.6 C)  SpO2: 95%   Filed Weights   09/29/18 1123  Weight: 290 lb 12.8 oz (131.9 kg)    GENERAL:alert, no distress and comfortable SKIN: skin color, texture, turgor are normal, no rashes or significant lesions EYES: normal, Conjunctiva are pink and non-injected, sclera clear OROPHARYNX:no exudate, no erythema and lips, buccal mucosa, and tongue normal  NECK: supple, thyroid normal size, non-tender, without nodularity LYMPH:  no palpable lymphadenopathy in the cervical, axillary or inguinal LUNGS: clear to auscultation and percussion with normal breathing effort HEART: regular rate & rhythm and no murmurs and no lower extremity edema ABDOMEN:abdomen soft, non-tender and normal bowel sounds Musculoskeletal:no cyanosis of digits and no clubbing  NEURO: alert & oriented x 3 with fluent speech, no focal motor/sensory deficits  LABORATORY DATA:    I have reviewed the data as  listed CBC Latest Ref Rng & Units 09/29/2018 08/24/2018 08/04/2018  WBC 4.0 - 10.5 K/uL 9.7 9.9 7.8  Hemoglobin 12.0 - 15.0 g/dL 13.5 13.5 13.2  Hematocrit 36.0 - 46.0 % 42.7 42.8 40.6  Platelets 150 - 400 K/uL 266 282 256     CMP Latest Ref Rng & Units 09/29/2018 08/24/2018 08/04/2018  Glucose 70 - 99 mg/dL 122(H) 127(H) 137(H)  BUN 8 - 23 mg/dL '10 11 9  ' Creatinine 0.44 - 1.00 mg/dL 0.85 0.89 0.85  Sodium 135 - 145 mmol/L 139 140 140  Potassium 3.5 - 5.1 mmol/L 3.7 3.8 3.9  Chloride 98 - 111 mmol/L 104 106 103  CO2 22 - 32 mmol/L '25 23 25  ' Calcium 8.9 - 10.3 mg/dL 10.3 10.4(H) 9.9  Total Protein 6.5 - 8.1 g/dL 8.5(H) 8.6(H) 7.8  Total Bilirubin 0.3 - 1.2 mg/dL 0.7 0.5 0.5  Alkaline Phos 38 - 126 U/L 125 88 63  AST 15 - 41 U/L 59(H) 56(H) 51(H)  ALT 0 - 44 U/L 42 39 34      RADIOGRAPHIC STUDIES: I have personally reviewed the radiological images as listed and agreed with the findings in the report. No results found.   ASSESSMENT & PLAN:  CARIE KAPUSCINSKI is a 63 y.o. female with   1. Colon Cancer, Stage I (pT2N0), MMR normal, oligo recurrent disease in liver in 12/2014, KRAS mutation (+), liver and lung recurrence in 06/2016 -She was initially diagnosed with sigmoid colon cancer in 02/2014. She was treated with partial colectomy.  -Unfortunately she had cancer recurrence to the liver in diagnosed in 12/2014. She was treated with CAPOX but only completed 3 cycles due to moderate side effects.  -She declined liver mass resection, and underwent liver ablation by interventional radiologist Dr. Earleen Newport in 08/2015. Since then she has progressed on maintenance Xeloda -She is currently being treated with Irinotecan and Avastin every 2 weeks since 12/2017.  -We discussed her CT CAP from 09/21/18 which shows progression with numerous b/l lung mets, liver mets and new left axillary and central mesenteric lymph nodes.  I reviewed her scan in person, and  discussed with her in details.  Her current treatment is no longer controlling her disease and will stop -I recommend next line therapy with FOLFOX, however she declines 5-fu pump infusion. I offered weekly 5-FU push but she declined again. Another option is 3rd/4th line oral chemo Lonsurf M-F 2 weeks on, 2 weeks off, or regorafenib, which overall have lower response rate than IV chemo.  -She opted to start with Lonsurf. Side effects were discussed with her in details, especially fatigue, anemia, neutropenia, and thrombocytopenia etc. I will call in today.  -she is not a good candidate for clinical trial due to her poor compliance, she is not very interested anyway  -F/u in 3 weeks    2. Right breast cancer, pT1bN0M0, stage Ia, ER+/PR+/HER2-, History of left Breast Cancer -She was diagnosed with Left breast DCIS on 02/01/15. She is s/p left lumpectomy in 2006 and 2011 by Dr. Margot Chimes, and radiation in 2011 by Dr. Sondra Come.  -She was diagnosed with right breast invasive ductal carcinoma and DCIS in 01/2015. She is s/p right breast lumpectomy and adjuvant radiation. -I previously reviewed her Oncotype DX result. Her recurrence score is 22, which predicts 14% 10 year risk of distant recurrence with tamoxifen alone. This is intermediate risk, based on her relatively low number, I do not recommend adjuvant chemotherapy. -She started anti-estrogen therapy with letrozole in 05/2016.  She is tolerating well, we'll continue for a total of 5-7 years  -We previously discussed breast cancer surveillance, I strongly encouraged her to continue annual screening mammogram, self exam, and follow-up with [place] routinely. -Last Mammogram in 12/2017 was NED. Next in 12/2018  3. History of PE (01/06/2014) -likely secondary to malignancy in the setting of family history for stroke (and possible stroke history in patient as well). -Continue Xarelto. Due to her metastatic colon cancer, I previously recommended her to continue  indefinitely.  4.  Uterine Fibroids  -Negative endometrial biopsy. Following with Gynecology.  5. Kidney lesion (Right) -No hypermetabolic right kidney lesion on the pet scan -Repeat abdominal MRI 05/29/2015 showed unchanged right renal lesion, favored to represent a complex/septate cyst. -Stable   6. Right flank pain, left Knee discomfort  -possibly related to her liver metastasis -I previously suggested she see a orthopedics for her knee issues.  -Continue Tylenol No. 4 as needed for pain. Her abdominal pain overall has improved, controlled.  -Left knee pain much improved but right flank pain is stable.    7. HTN  -Continue Amlodipine, lopressor and HCTZ  -BP stable.   8. Hypokalemia  -Potassium normalized, Continue oral K BID   9. Social Support, depression  -She has good support from her husband and daughter.  -She has been depressed lately due to her cancer progression (she read the CT scan report last week and canceled appointment last week).  She has not told her husband and a daughter about this, and came in today by herself.  She broke into tears during the visit today.  I offered her the chance to speak with our [place] CT scan reviewed, unfortunately she progressed.  Will stopped FOLFIRI and avastin  -I prescribed her Lonsurf today, she will start when she receives it  -Lab, flush and f/u the week of 2/3  -SW referral   No problem-specific Assessment & Plan notes found for this encounter.   No orders of the defined types were placed in this encounter.  All questions were answered. The patient knows to call the clinic with any problems, questions or concerns. No barriers to learning was detected. I spent 25 minutes counseling the patient face to face. The total time spent in the appointment was 30 minutes and more than 50% was on counseling and review of test results     [name], MD 09/29/2018   I, Joslyn Devon, am acting as scribe for  [name], MD.   I have reviewed the above documentation for accuracy and completeness, and I agree with the above.

## 2018-09-27 NOTE — Telephone Encounter (Signed)
Patient calls for refill on Tylenol #4 stating has 3 pills left.  Last filled on 09/09/18 #90

## 2018-09-29 ENCOUNTER — Encounter: Payer: Self-pay | Admitting: Hematology

## 2018-09-29 ENCOUNTER — Inpatient Hospital Stay: Payer: BLUE CROSS/BLUE SHIELD | Attending: Hematology

## 2018-09-29 ENCOUNTER — Telehealth: Payer: Self-pay | Admitting: Pharmacist

## 2018-09-29 ENCOUNTER — Inpatient Hospital Stay (HOSPITAL_BASED_OUTPATIENT_CLINIC_OR_DEPARTMENT_OTHER): Payer: BLUE CROSS/BLUE SHIELD | Admitting: Hematology

## 2018-09-29 ENCOUNTER — Inpatient Hospital Stay: Payer: BLUE CROSS/BLUE SHIELD

## 2018-09-29 VITALS — BP 152/95 | HR 88 | Temp 97.9°F | Resp 18 | Ht 66.0 in | Wt 290.8 lb

## 2018-09-29 DIAGNOSIS — Z7901 Long term (current) use of anticoagulants: Secondary | ICD-10-CM | POA: Insufficient documentation

## 2018-09-29 DIAGNOSIS — C50511 Malignant neoplasm of lower-outer quadrant of right female breast: Secondary | ICD-10-CM

## 2018-09-29 DIAGNOSIS — C187 Malignant neoplasm of sigmoid colon: Secondary | ICD-10-CM

## 2018-09-29 DIAGNOSIS — R102 Pelvic and perineal pain: Secondary | ICD-10-CM | POA: Insufficient documentation

## 2018-09-29 DIAGNOSIS — Z170 Estrogen receptor positive status [ER+]: Principal | ICD-10-CM

## 2018-09-29 DIAGNOSIS — Z86711 Personal history of pulmonary embolism: Secondary | ICD-10-CM

## 2018-09-29 DIAGNOSIS — E876 Hypokalemia: Secondary | ICD-10-CM | POA: Diagnosis not present

## 2018-09-29 DIAGNOSIS — K5900 Constipation, unspecified: Secondary | ICD-10-CM | POA: Insufficient documentation

## 2018-09-29 DIAGNOSIS — M545 Low back pain: Secondary | ICD-10-CM | POA: Insufficient documentation

## 2018-09-29 DIAGNOSIS — C7800 Secondary malignant neoplasm of unspecified lung: Secondary | ICD-10-CM

## 2018-09-29 DIAGNOSIS — C787 Secondary malignant neoplasm of liver and intrahepatic bile duct: Secondary | ICD-10-CM | POA: Insufficient documentation

## 2018-09-29 DIAGNOSIS — I10 Essential (primary) hypertension: Secondary | ICD-10-CM | POA: Insufficient documentation

## 2018-09-29 DIAGNOSIS — R109 Unspecified abdominal pain: Secondary | ICD-10-CM | POA: Diagnosis not present

## 2018-09-29 DIAGNOSIS — F329 Major depressive disorder, single episode, unspecified: Secondary | ICD-10-CM | POA: Diagnosis not present

## 2018-09-29 DIAGNOSIS — Z95828 Presence of other vascular implants and grafts: Secondary | ICD-10-CM

## 2018-09-29 DIAGNOSIS — Z86718 Personal history of other venous thrombosis and embolism: Secondary | ICD-10-CM

## 2018-09-29 DIAGNOSIS — R599 Enlarged lymph nodes, unspecified: Secondary | ICD-10-CM | POA: Diagnosis not present

## 2018-09-29 LAB — CMP (CANCER CENTER ONLY)
ALK PHOS: 125 U/L (ref 38–126)
ALT: 42 U/L (ref 0–44)
AST: 59 U/L — ABNORMAL HIGH (ref 15–41)
Albumin: 3.2 g/dL — ABNORMAL LOW (ref 3.5–5.0)
Anion gap: 10 (ref 5–15)
BUN: 10 mg/dL (ref 8–23)
CALCIUM: 10.3 mg/dL (ref 8.9–10.3)
CO2: 25 mmol/L (ref 22–32)
Chloride: 104 mmol/L (ref 98–111)
Creatinine: 0.85 mg/dL (ref 0.44–1.00)
GFR, Est AFR Am: >60 mL/min (ref >60)
GFR, Estimated: >60 mL/min (ref >60)
Glucose, Bld: 122 mg/dL — ABNORMAL HIGH (ref 70–99)
Potassium: 3.7 mmol/L (ref 3.5–5.1)
Sodium: 139 mmol/L (ref 135–145)
Total Bilirubin: 0.7 mg/dL (ref 0.3–1.2)
Total Protein: 8.5 g/dL — ABNORMAL HIGH (ref 6.5–8.1)

## 2018-09-29 LAB — CBC WITH DIFFERENTIAL (CANCER CENTER ONLY)
Abs Immature Granulocytes: 0.02 10*3/uL (ref 0.00–0.07)
Basophils Absolute: 0.1 10*3/uL (ref 0.0–0.1)
Basophils Relative: 1 %
Eosinophils Absolute: 0.3 10*3/uL (ref 0.0–0.5)
Eosinophils Relative: 3 %
HEMATOCRIT: 42.7 % (ref 36.0–46.0)
Hemoglobin: 13.5 g/dL (ref 12.0–15.0)
Immature Granulocytes: 0 %
LYMPHS ABS: 2.2 10*3/uL (ref 0.7–4.0)
Lymphocytes Relative: 23 %
MCH: 29.1 pg (ref 26.0–34.0)
MCHC: 31.6 g/dL (ref 30.0–36.0)
MCV: 92 fL (ref 80.0–100.0)
Monocytes Absolute: 0.8 10*3/uL (ref 0.1–1.0)
Monocytes Relative: 8 %
Neutro Abs: 6.4 10*3/uL (ref 1.7–7.7)
Neutrophils Relative %: 65 %
Platelet Count: 266 10*3/uL (ref 150–400)
RBC: 4.64 MIL/uL (ref 3.87–5.11)
RDW: 16.6 % — ABNORMAL HIGH (ref 11.5–15.5)
WBC Count: 9.7 10*3/uL (ref 4.0–10.5)
nRBC: 0 % (ref 0.0–0.2)

## 2018-09-29 LAB — CEA (IN HOUSE-CHCC): CEA (CHCC-In House): 108.17 ng/mL — ABNORMAL HIGH (ref 0.00–5.00)

## 2018-09-29 MED ORDER — HEPARIN SOD (PORK) LOCK FLUSH 100 UNIT/ML IV SOLN
500.0000 [IU] | Freq: Once | INTRAVENOUS | Status: AC
  Administered 2018-09-29: 500 [IU] via INTRAVENOUS
  Filled 2018-09-29: qty 5

## 2018-09-29 MED ORDER — TRIFLURIDINE-TIPIRACIL 20-8.19 MG PO TABS: 35.0000 mg/m2 | ORAL_TABLET | Freq: Two times a day (BID) | ORAL | 2 refills | Status: DC

## 2018-09-29 MED ORDER — SODIUM CHLORIDE 0.9% FLUSH
10.0000 mL | INTRAVENOUS | Status: DC | PRN
  Administered 2018-09-29: 10 mL via INTRAVENOUS
  Filled 2018-09-29: qty 10

## 2018-09-29 NOTE — Progress Notes (Signed)
Left voice message for Lorrin Jackson Chaplain requesting she see the patient today if possible due to disease progression.

## 2018-09-29 NOTE — Telephone Encounter (Signed)
Oral Oncology Pharmacist Encounter  Received new prescription for Lonsurf (trifluridine/tipiracil) for the treatment of metastatic sigmoid colon cancer, Kras mutation positive, that has progressed on treatment with fluoropyrimidine, oxaliplatin, irinotecan, and VEGF-inhibitor, planned duration until disease progression or unacceptable toxicity.  Labs from 09/29/2018 assessed, OK for treatment.  Lonsurf will be administered at ~32 mg/m2 BID on days 1-5 & 8-12 of each day day cycle BSA=2.48, dose capped at 38m BID per manufacturer recommendation  Current medication list in Epic reviewed, no DDIs with Lonsurf identified.  [facility].  Oral Oncology Clinic will continue to follow for insurance authorization, copayment issues, initial counseling and start date.  JJohny Drilling PharmD, BCPS, BCOP  09/29/2018 3:39 PM Oral Oncology Clinic [phone]

## 2018-09-30 ENCOUNTER — Telehealth: Payer: Self-pay

## 2018-09-30 MED ORDER — TRIFLURIDINE-TIPIRACIL 20-8.19 MG PO TABS: ORAL_TABLET | ORAL | 2 refills | Status: DC

## 2018-09-30 NOTE — Telephone Encounter (Signed)
Oral Oncology Patient Advocate Encounter  Received notification from [facility] that prior authorization for [place] is required.  PA submitted on CoverMyMeds Key [id number] Status is pending  Oral Oncology Clinic will continue to follow.  [place] Patient [place] Phone [phone] Fax [phone]

## 2018-10-01 NOTE — Telephone Encounter (Signed)
Oral Oncology Patient Advocate Encounter  Prior Authorization for Veronica Reyes has been approved.    PA# [id number] Effective dates: 09/30/18 through 09/29/19  Oral Oncology Clinic will continue to follow.   Veronica Reyes Patient [name] Phone [phone] Fax [phone]

## 2018-10-01 NOTE — Telephone Encounter (Signed)
Oral Chemotherapy Pharmacist Encounter   Attempted to reach patient to provide update and offer for initial counseling on oral medication: Lonsurf.  Patient is not ready to speak about the [place]. She does not wish for her daughter or husband to know that she has progressive disease, and her duaghter is currently present. She requests that we call back Monday 10/04/18 AM to discuss Lonsurf and then abruptly terminated call.  Oral oncology clinic will reach out to patient on Monday to discuss medication acquisition and initial counseling.  Johny Drilling, PharmD, BCPS, BCOP  10/01/2018   2:26 PM Oral Oncology Clinic [phone]

## 2018-10-04 NOTE — Telephone Encounter (Signed)
Oral Chemotherapy Pharmacist Encounter     I spoke with patient for overview of: Lonsurf (trifluridine/tipiracil) for the treatment of metastatic sigmoid colon cancer, Kras mutation positive, that has progressed on treatment with fluoropyrimidine, oxaliplatin, irinotecan, and VEGF-inhibitor, planned duration until disease progression or unacceptable toxicity.    Counseled patient on administration, dosing, side effects, monitoring, drug-food interactions, safe handling, storage, and disposal.   Patient will take Lonsurf 56m (trifluridine component) tablets, 4 tablets (843mtrifluridine) by mouth twice daily, within 1 hour hour of finishing AM & PM meals, on days 1-5 and days 8-12, every 28 days.  Patient plans to take Lonsurf M-F, Saturday and Sunday off days, Lonsurf M-F again, then no tablets for the next 2 weeks. Patient will start D1 of each cycle on Mondays.  Lonsurf start date: 10/11/2018   Adverse effects include but are not limited to: fatigue, nausea, vomiting, diarrhea, and decreased blood counts.    Patient has anti-emetic on hand and knows to take it if nausea develops.   Patient will obtain anti diarrheal and alert the office of 4 or more loose stools above baseline.   Reviewed with patient importance of keeping a medication schedule and plan for any missed doses.   [place] voiced understanding and appreciation.    All questions answered.  Medication reconciliation performed and medication/allergy list updated.  Patient informed her current appointment schedule will change as her infusion appointments are still currently scheduled. Message sent to MD and collaborative practice RN to assess.  [facility] ship from the [facility] 10/05/2018 for delivery to patient's home address on 1/22. Copayment of ~$1000 will be covered by manufacturer copayment coupon. She will start 1/27. Patient reminded that pharmacy will contact her 5-7 days prior to needing  next Lonsurf fill to ensure medication acquisition.   Patient knows to call the office with questions or concerns. Oral Oncology Clinic will continue to follow.   JeJohny DrillingPharmD, BCPS, BCOP  10/04/2018   9:42 AM Oral Oncology Clinic [phone]

## 2018-10-05 MED FILL — LONSURF 20 MG-8.19 MG TAB: 20-8.19 | 28 days supply | Qty: 80 | Fill #0

## 2018-10-05 NOTE — Telephone Encounter (Signed)
Oral Oncology Patient Advocate Encounter  Confirmed with [place] that Veronica Reyes was shipped on 10/05/18 for $0 copay using the copay card. Lonsurf will be delivered to the patient on 10/06/18.   [place] Patient [place] Phone [phone] Fax [phone]

## 2018-10-07 ENCOUNTER — Telehealth: Payer: Self-pay | Admitting: Hematology

## 2018-10-07 NOTE — Telephone Encounter (Signed)
Scheduled appt per 1/21 sch message - called home and mobile - unable to reach patient - left message on home phone - sent reminder letter in the mail with new appts.

## 2018-10-13 ENCOUNTER — Ambulatory Visit: Payer: BLUE CROSS/BLUE SHIELD

## 2018-10-13 ENCOUNTER — Telehealth: Payer: Self-pay | Admitting: *Deleted

## 2018-10-13 ENCOUNTER — Other Ambulatory Visit: Payer: BLUE CROSS/BLUE SHIELD

## 2018-10-13 ENCOUNTER — Ambulatory Visit: Payer: BLUE CROSS/BLUE SHIELD | Admitting: Hematology

## 2018-10-13 NOTE — Telephone Encounter (Signed)
TC from patient requesting refill of her Tylenol #4 tabs.  This was last filled on 09/27/18 for #90 @ [facility]

## 2018-10-14 ENCOUNTER — Other Ambulatory Visit: Payer: BLUE CROSS/BLUE SHIELD

## 2018-10-14 ENCOUNTER — Ambulatory Visit: Payer: BLUE CROSS/BLUE SHIELD | Admitting: Hematology

## 2018-10-14 ENCOUNTER — Other Ambulatory Visit: Payer: Self-pay | Admitting: Hematology

## 2018-10-14 DIAGNOSIS — C187 Malignant neoplasm of sigmoid colon: Secondary | ICD-10-CM

## 2018-10-14 MED ORDER — ACETAMINOPHEN-CODEINE #4 300-60 MG PO TABS: 1.0000 | ORAL_TABLET | Freq: Four times a day (QID) | ORAL | 0 refills | Status: DC | PRN

## 2018-10-14 NOTE — Telephone Encounter (Signed)
Refilled today.   [name] MD

## 2018-10-18 NOTE — Progress Notes (Signed)
[place]   [phone] [phone]   Clinic Follow up Note   Patient Care Team: [name], MD as PCP - General (Internal Medicine) Leighton Ruff, MD as Consulting Physician (General Surgery) Carol Ada, MD as Consulting Physician (Gastroenterology) Concha Norway, MD (Inactive) as Consulting Physician (Internal Medicine) Emily Filbert, MD as Consulting Physician (Obstetrics and Gynecology) Stark Klein, MD as Consulting Physician (General Surgery) [name], MD as Consulting Physician (Hematology) Jonnie Finner, RN as Registered Nurse 10/20/2018  CHIEF COMPLAINT: F/u of colon cancer and h/o of  breast cancer    SUMMARY OF ONCOLOGIC HISTORY: Oncology History   Breast cancer of lower-outer quadrant of right female breast [facility])   Staging form: Breast, AJCC 7th Edition     Clinical stage from 02/01/2015: Stage IA (T1a, N0, M0) - Signed by [name], MD on 06/04/2015     Pathologic stage from 11/10/2015: Stage IA (T1b, N0, M0) - Signed by [name], MD on 12/08/2015 Cancer of sigmoid colon [facility])   Staging form: Colon and Rectum, AJCC 7th Edition     Clinical: Stage I (T2, N0, M0) - Unsigned     Breast cancer of lower-outer quadrant of right female breast ([place])   2006 Cancer Diagnosis    Left breast DCIS diagnosed in 2006 and 2010, status post lumpectomy.    01/07/2014 Initial Diagnosis    Breast cancer    01/18/2015 Mammogram    11m mass in lower outer quadrant of right breast     02/01/2015 Initial Biopsy    (R) breast mass biopsy showed invasive ductal carcinoma & DCIS, grade 1-2.     02/01/2015 Receptors her2    ER 90%+, PR 10%+, Ki67 10%, HER2 negative (ratio 1.09)     11/07/2015 Surgery    (R) breast lumpectomy and sentinel lymph node biopsy ([name]    11/07/2015 Pathology Results    (R) breast lumpectomy showed invasive ductal carcinoma, grade 2, 1 cm, low-grade DCIS, negative margins, 3 sentinel lymph nodes were negative.  LVI (-). HER2 repeated and remains negative (ratio 1.29).      11/07/2015 Pathologic Stage    pT1b,pN0: Stage IA     11/07/2015 Oncotype testing    RS 22, which predicts a year risk of distant recurrence with tamoxifen alone 14% (intermediate-risk). Adjuvant chemo was not offered    02/21/2016 - 04/10/2016 Radiation Therapy    Adjuvant breast radiation. ([name]). Right breast: 50.4 Gy in 28 fractions.  Right breast boost: 12 Gy in 6 fractions.    05/2016 -  Anti-estrogen oral therapy    Femara (Letrozole) 2.5 mg daily.     10/17/2016 Mammogram    MM DIAG BREAST TOMO BILATERAL 10/17/2016 FINDINGS: There are bilateral lumpectomy changes in the upper central left breast and upper central right breast. No mass, nonsurgical distortion, or suspicious microcalcification is identified in either breast to suggest malignancy. Mammographic images were processed with CAD. IMPRESSION: No evidence of malignancy in either breast. Lumpectomy changes bilaterally. RECOMMENDATION: Diagnostic mammogram is suggested in 1 year. (Code:[id number])    12/16/2017 Mammogram    IMPRESSION: No mammographic evidence of malignancy in either breast, status post bilateral mastectomies. RECOMMENDATION: Diagnostic mammogram is suggested in 1 year.      Cancer of sigmoid colon ([place]   03/04/2014 Initial Diagnosis    Cancer of sigmoid colon    03/09/2014 Pathology Results    G1 invasive adenocarcinoma, no lymph-Vascular invasion or Peri-neural invasion: Absent. MMR normal, MSI stable.  03/09/2014 Surgery    partial colectomy, negative margins.     11/10/2014 Relapse/Recurrence    biopsy confirmed oligo liver recurrence     11/10/2014 Imaging    PET showed two small ares of hypermetabolism in the right lobe of the liver, no other distant metastasis.     11/30/2014 Imaging    abdomen MRI showed a new enhancing lesion which corresponds to an area of abnormal hyper metabolism on recent PET-CT. Stable enlarged portal caval  lymph node.     01/02/2015 Pathology Results    Liver biopsy showed metastatic adenocarcinoma, IHC (+) for CK20, CDX-2, (-) for TTF-1 and ER    01/02/2015 Miscellaneous    liver biopsy KRAS mutation (+)     02/14/2015 Imaging    CT showed:  the small metastatic right hepatic lobe liver lesion is not identified for certain on this examination. No new lesions. Stable small cysts in segment 4 a.     03/05/2015 - 04/16/2015 Chemotherapy    CAPEOX (capecitabine 2500 mg twice daily Day 1-14, oxaliplatin 1 30 mg/m on day 1, every 21 days, S/P 3 cycles     08/24/2015 Procedure    CT-guided oligo liver metastasis microwave ablation by Dr. [name]    06/17/2016 Imaging    CT of the chest/abd/pelvis with contrast 1. Several new small pulmonary nodules worrisome for metastatic disease. 2. New large area of probable infiltrating recurrent tumor in the right hepatic lobe surrounding the prior ablation site. 3. Small supraclavicular lymph nodes.  Attention on future studies. 4. Stable surgical changes involving both breasts. No breast masses are identified. 5. Stable bilateral adrenal gland nodules. 6. Stable upper abdominal and right-sided retroperitoneal lymph nodes.    07/08/2016 PET scan    1. Large mass in the right lobe of the liver demonstrates diffuse hypermetabolism, compatible with recurrent metastatic disease in the liver 2. Multiple small pulmonary nodules are very similar to the recent Chest CT 06/17/16.  3. No other findings of metastatic disease elsewhere in the neck, chest, abdomen, or pelvis. 4. There is some low-level hypermetabolic activity adjacent to the surgical clips in the lateral aspect of the right breast at the site of prior lumpectomy.     07/29/2016 Pathology Results    Liver biopsy confirmed metastatic colon cancer     07/31/2016 - 07/31/2017 Chemotherapy    Chemotherapy FOLFIRI and Avastin (from cycle 2) and Leucovorin (added from cycle 8) every 2 weeks, started on  07/31/2016, multiple delay per pt's request; chemo break from 5/30-6/21/18; decreased leucovorin to 2000 mg/m2, irinotecan to 167m/m2, 5-fu 2009mm2 from 03/05/17, due to poor tolerance. Started chemo break since 07/31/17.     10/23/2016 Imaging    CT ABDOMEN PELVIS W CONTRAST 10/24/16 IMPRESSION: 1. The numerous tiny bilateral pulmonary nodules seen on the previous imaging exams are no longer evident, suggesting response of therapy. 2. Large ill-defined irregular right hepatic lesion measures smaller on today's study. 3. Slight increase in size of hepatoduodenal ligament lymphadenopathy. 4. Abdominal Aortic Atherosclerois (ICD10-170.0)    01/05/2017 Imaging    CT CAP IMPRESSION: 1. Left lower lobe pulmonary nodule and hepatic metastatic disease are stable. 2. Hepatoduodenal ligament lymph node is stable. 3. Aortic atherosclerosis (ICD10-170.0). Coronary artery calcification. 4. Enlarged pulmonary arteries, indicative of pulmonary arterial hypertension. 5. Small bilateral adrenal nodules are unchanged.    05/09/2017 Imaging    CT CAP IMPRESSION: 1. Stable appearance left lower lobe pulmonary nodule with an apparent new tiny posterior right upper lobe pulmonary nodule.  2. Dominant ill-defined right hepatic mass measures smaller on today's study. 3. Slight decrease in the hepatoduodenal ligament lymph node. 4. Aortic Atherosclerois (ICD10-170.0)    05/09/2017 Imaging    CT CAP 05/09/17 IMPRESSION: 1. Stable appearance left lower lobe pulmonary nodule with an apparent new tiny posterior right upper lobe pulmonary nodule. Continued attention on follow-up recommended. 2. Dominant ill-defined right hepatic mass measures smaller on today's study. 3. Slight decrease in the hepatoduodenal ligament lymph node. 4.  Aortic Atherosclerois (ICD10-170.0)     08/11/2017 Imaging    CT CAP W Contrast 08/11/17 IMPRESSION: 1. Essentially stable appearance of small pulmonary nodules, hypodense mass  laterally in the right hepatic lobe, and mild portacaval adenopathy. No new or progressive lesions. 2. Other imaging findings of potential clinical significance: Aortic Atherosclerosis (ICD10-I70.0). Lower lumbar impingement due to spondylosis and degenerative disc disease. Uterine fibroids. Bilateral renal cysts. Coronary atherosclerosis. Mild mitral valve calcification. Mild cardiomegaly.      09/24/2017 - 12/04/2017 Chemotherapy    Maintenance Xeloda 2500 mg twice daily days 1 through 14 of each cycle of treatment and Avastin given every 3 weeks starting on 09/24/2017. Due to slight progression this was stopped on 12/04/17     12/02/2017 Imaging    CT CAP W Contrast 12/02/17 IMPRESSION: Chest Impression: 1. Interval increase in size of bilateral small pulmonary nodules. 2. No mediastinal adenopathy  Abdomen / Pelvis Impression: 1. Dominant lesion in the RIGHT hepatic lobe is similar; however there are 2 adjacent low-density lesions in the RIGHT hepatic lobe which are not appreciated on comparison exam and concerning recurrent hepatic metastasis. 2. Interval increase in size periaortic retroperitoneal lymph nodes consistent with recurrent metastatic adenopathy.    01/08/2018 - 08/24/2018 Chemotherapy    Due to slight disease progression she restarted on Irinotecan plus Avastin every 2 weeks on 01/08/18. Added 5-Fu bolus and Leucovorin on 04/13/18. Stopped on 08/24/18 due to disease progression.     04/09/2018 Imaging    04/09/2018 CT CAP W Contrast IMPRESSION: 1. Slight interval increase in size as well as development of new low-attenuation lesions within the liver. 2. Grossly unchanged bilateral pulmonary nodules. 3. Similar-appearing retroperitoneal adenopathy. 4. Small amount of gas within the endometrium of the uterus, nonspecific in etiology. Recommend clinical correlation for the possibility of infectious symptoms. Consider further evaluation with pelvic ultrasound as  indicated.     06/07/2018 Imaging    06/07/2018 CT CAP IMPRESSION: Chest Impression:  1. Stable bilateral pulmonary nodules. 2. Single new pulmonary nodule in the RIGHT lower lobe with ill-defined borders. Recommend short-term follow-up CT (6-8 weeks) as this may represent a focus of pulmonary infection.  Abdomen / Pelvis Impression:  1. Stable hepatic metastasis in the RIGHT hepatic lobe. No new lesions. 2. Small periaortic retroperitoneal lymph nodes are unchanged. 3. Sigmoid anastomosis without local recurrence. 4. Mild nodular contour of liver could indicate early cirrhosis.     Chemotherapy    PENDING Third-line Lonsurf M-F 2 weeks on, 2 weeks off      CURRENT THERAPY  1. Letrozole 2.5 mg once daily, started in September 2017 2. Third-line Lonsurf M-F 2 weeks on, 2 weeks off, started 10/11/18  INTERVAL HISTORY: Veronica Reyes is a 62 y.o. female who is here for follow-up. Since the last visit she started Third-line Lonsurf M-F 2 weeks on, 2 weeks off. Today, she is here with her sister. She is feeling depressed and has very low energy. She describes feeling nausea after taking chemotherapy pill but takes nausea  medication to prevent it. She cannot take a shower, stand for a long time, and describes that most of the time she is sitting down. She describes lower abdominal pain, back pain and  frequent diarrhea.   Pertinent positives and negatives of review of systems are listed and detailed within the above HPI.  REVIEW OF SYSTEMS:   Constitutional: Denies fevers, chills or abnormal weight loss, (+) low energy , (+) psychologically distressed  Eyes: Denies blurriness of vision Ears, nose, mouth, throat, and face: Denies mucositis or sore throat Respiratory: Denies cough, dyspnea or wheezes Cardiovascular: Denies palpitation, chest discomfort or lower extremity swelling Gastrointestinal:  Denies heartburn, (+) nausea due to chemo pill, (+) diarrhea, (+) lower  abdominal pain  Skin: Denies abnormal skin rashes Lymphatics: Denies new lymphadenopathy or easy bruising Neurological:Denies numbness, tingling or new weaknesses Behavioral/Psych: Mood is stable, no new changes  MSK: (+) back pain  All other systems were reviewed with the patient and are negative.  MEDICAL HISTORY:  Past Medical History:  Diagnosis Date  . Anxiety   . Breast CA ([place])    radiation and surgery-lt.  . Cancer ([place])    left breast cancer x2  . Hypertension   . Liver lesion   . Personal history of chemotherapy   . Personal history of radiation therapy 2017  . Pulmonary embolism ([place])    "blood clot in lungs" -dx. 4'15 with CT Chest. On Xarelto  . Radiation 01/22/10-03/12/10   left whole breast 4500 cGy, upper aspect boosted to 6120 cGy  . Radiation 02/21/16 - 04/10/16   right breast 50.4 Gy, right breast boost 12 Gy  . Shortness of breath    sob with exertion. dx. with Pulmonary emboli 4'15 ,tx. Xarelto,Lovenox used for [employer].    SURGICAL HISTORY: Past Surgical History:  Procedure Laterality Date  . BREAST BIOPSY Left 2010   malignant  . BREAST BIOPSY Right 01/2015   malignant  . BREAST LUMPECTOMY Left 2006  . BREAST LUMPECTOMY Left 2010  . BREAST LUMPECTOMY Right 11/07/2015  . BREAST LUMPECTOMY WITH RADIOACTIVE SEED AND SENTINEL LYMPH NODE BIOPSY Right 11/07/2015   Procedure: BREAST LUMPECTOMY WITH RADIOACTIVE SEED AND SENTINEL LYMPH NODE BIOPSY;  Surgeon: Stark Klein, MD;  Location: [facility];  Service: General;  Laterality: Right;  . BREAST SURGERY     '11- Left breast lumpectomy  . CESAREAN SECTION    . CHOLECYSTECTOMY     Laparoscopic 20 yrs ago  . COLON SURGERY  03-09-14   partial colectomy for cancer  . IR GENERIC HISTORICAL  07/29/2016   IR FLUORO GUIDE PORT INSERTION RIGHT 07/29/2016 Sandi Mariscal, MD [facility]  . IR GENERIC HISTORICAL  07/29/2016   IR US GUIDE VASC ACCESS RIGHT 07/29/2016 Sandi Mariscal, MD [facility]  . IR GENERIC  HISTORICAL  07/29/2016   IR US GUIDE BX ASP/DRAIN 07/29/2016 [facility]  . PARTIAL COLECTOMY N/A 03/09/2014   Procedure: LAPAROSCOPIC ASSISTED PARTIAL COLECTOMY, splenic flexure mobilization;  Surgeon: Leighton Ruff, MD;  Location: [facility];  Service: General;  Laterality: N/A;  . RADIOFREQUENCY ABLATION Right 08/24/2015   Procedure: MICROWAVE ABLATION RIGHT LIVER LOBE ;  Surgeon: Corrie Mckusick, DO;  Location: [facility];  Service: Anesthesiology;  Laterality: Right;    I have reviewed the social history and family history with the patient and they are unchanged from previous note.  ALLERGIES:  is allergic to tramadol.  MEDICATIONS:  Current Outpatient Medications  Medication Sig Dispense Refill  . acetaminophen-codeine (TYLENOL #4)  300-60 MG tablet Take 1 tablet by mouth every 6 (six) hours as needed. for pain 90 tablet 0  . amLODipine (NORVASC) 5 MG tablet Take 1 tablet (5 mg total) by mouth daily. 30 tablet 3  . ferrous sulfate 325 (65 FE) MG tablet Take 325 mg by mouth daily with breakfast. Pt takes  4 times per week.    . hydrochlorothiazide (HYDRODIURIL) 25 MG tablet TAKE 1 TABLET BY MOUTH EVERY IN THE MORNING 30 tablet 3  . letrozole (FEMARA) 2.5 MG tablet TAKE 1 TABLET BY MOUTH EVERY DAY 30 tablet 3  . lidocaine-prilocaine (EMLA) cream Apply 1 application topically as needed. 30 g 2  . LORazepam (ATIVAN) 0.5 MG tablet Take 1 tablet (0.5 mg total) by mouth every 8 (eight) hours. 30 tablet 0  . metoprolol tartrate (LOPRESSOR) 50 MG tablet Take 1 tablet (50 mg total) by mouth 2 (two) times daily. 60 tablet 1  . metoprolol tartrate (LOPRESSOR) 50 MG tablet TAKE 1 TABLET BY MOUTH 2 TIMES DAILY 60 tablet 2  . ondansetron (ZOFRAN) 8 MG tablet Take 1 tablet (8 mg total) by mouth 2 (two) times daily as needed for refractory nausea / vomiting. Start on day 3 after chemotherapy. 30 tablet 1  . potassium chloride SA (K-DUR,KLOR-CON) 20 MEQ tablet TAKE 1 TABLET BY MOUTH 2 TIMES DAILY 60 tablet 2  .  prochlorperazine (COMPAZINE) 10 MG tablet Take 1 tablet (10 mg total) by mouth every 6 (six) hours as needed (NAUSEA). 30 tablet 2  . promethazine (PHENERGAN) 25 MG tablet Take 1 tablet (25 mg total) by mouth every 6 (six) hours as needed for nausea or vomiting. 30 tablet 2  . trifluridine-tipiracil (LONSURF) 20-8.19 MG tablet Take 4 tablets (69m trifluridine) by mouth twice daily, within 1 hr after AM & PM meals on days 1-5 & 8-12 of each 28 day cycle 80 tablet 2  . XARELTO 20 MG TABS tablet TAKE 1 TABLET BY MOUTH EVERY DAY 30 tablet 3  . zolpidem (AMBIEN) 5 MG tablet Take 1 tablet (5 mg total) by mouth at bedtime as needed for sleep. 20 tablet 0  . mirtazapine (REMERON) 7.5 MG tablet Take 1 tablet (7.5 mg total) by mouth at bedtime. 30 tablet 1   No current facility-administered medications for this visit.     PHYSICAL EXAMINATION: ECOG PERFORMANCE STATUS: 3 - Symptomatic, >50% confined to bed  Vitals:   10/19/18 1336  BP: (!) 144/85  Pulse: 80  Resp: 18  Temp: 99 F (37.2 C)  SpO2: 97%   Filed Weights   10/19/18 1336  Weight: 284 lb 1.6 oz (128.9 kg)    GENERAL:alert, no distress and comfortable SKIN: skin color, texture, turgor are normal, no rashes or significant lesions EYES: normal, Conjunctiva are pink and non-injected, sclera clear OROPHARYNX:no exudate, no erythema and lips, buccal mucosa, and tongue normal  NECK: supple, thyroid normal size, non-tender, without nodularity LYMPH:  no palpable lymphadenopathy in the cervical, axillary or inguinal LUNGS: clear to auscultation and percussion with normal breathing effort HEART: regular rate & rhythm and no murmurs and no lower extremity edema ABDOMEN:abdomen soft, non-tender and normal bowel sounds Musculoskeletal:no cyanosis of digits and no clubbing  NEURO: alert & oriented x 3 with fluent speech, no focal motor/sensory deficits  LABORATORY DATA:  I have reviewed the data as listed CBC Latest Ref Rng & Units 10/19/2018  09/29/2018 08/24/2018  WBC 4.0 - 10.5 K/uL 7.8 9.7 9.9  Hemoglobin 12.0 - 15.0 g/dL 12.6 13.5  13.5  Hematocrit 36.0 - 46.0 % 38.2 42.7 42.8  Platelets 150 - 400 K/uL 325 266 282     CMP Latest Ref Rng & Units 10/19/2018 09/29/2018 08/24/2018  Glucose 70 - 99 mg/dL 149(H) 122(H) 127(H)  BUN 8 - 23 mg/dL '9 10 11  ' Creatinine 0.44 - 1.00 mg/dL 0.85 0.85 0.89  Sodium 135 - 145 mmol/L 135 139 140  Potassium 3.5 - 5.1 mmol/L 3.2(L) 3.7 3.8  Chloride 98 - 111 mmol/L 98 104 106  CO2 22 - 32 mmol/L '27 25 23  ' Calcium 8.9 - 10.3 mg/dL 10.0 10.3 10.4(H)  Total Protein 6.5 - 8.1 g/dL 8.3(H) 8.5(H) 8.6(H)  Total Bilirubin 0.3 - 1.2 mg/dL 0.8 0.7 0.5  Alkaline Phos 38 - 126 U/L 150(H) 125 88  AST 15 - 41 U/L 46(H) 59(H) 56(H)  ALT 0 - 44 U/L 35 42 39      RADIOGRAPHIC STUDIES: I have personally reviewed the radiological images as listed and agreed with the findings in the report. No results found.   ASSESSMENT & PLAN:  Veronica Reyes is a 63 y.o. female with history of  1. Colon Cancer, Stage I (pT2N0), MMR normal, oligo recurrent disease in liver in 12/2014, KRAS mutation (+), liver and lung recurrence in 06/2016 -She was initially diagnosed with sigmoid colon cancer in 02/2014. She was treated with partial colectomy.  -Unfortunately she had cancer recurrence to the liver in diagnosed in 12/2014. She was treated with CAPOX but only completed 3 cycles due to moderate side effects.  -She declined liver mass resection, and underwent liver ablation by interventional radiologist Dr. Earleen Newport in 08/2015. Since then she has progressed on maintenance Xeloda -Treatment wwith Irinotecan and Avastin every 2 weeks since 12/2017  -Her CT CAP from 09/21/18 showed progression with numerous b/l lung mets, liver mets and new left axillary and central mesenteric lymph nodes. Irinotecan/5-fu bolus/Avastin treatment is no longer controlling her disease and will stop -she has started 4th line oral chemo Lonsurf  M-F 2 weeks on, 2 weeks off,  she is currently on second week treatment.  She has developed a severe fatigue.  We discussed stopped the medication a few days earlier, however she feels she is able to finish and he wants to finish this week. -She will return next Monday for lab monitoring -I will see her in 2 weeks before her cycle 2.  If she does not recover well, will consider dose reduction for cycle 2.  2. Right breast cancer, pT1bN0M0, stage Ia, ER+/PR+/HER2-, History of left Breast Cancer -She was diagnosed with Left breast DCIS on 02/01/15. She is s/p left lumpectomy in 2006 and 2011 by Dr. Margot Chimes, and radiation in 2011 by Dr. Sondra Come.  -She was diagnosed with right breast invasive ductal carcinoma and DCIS in 01/2015. She is s/p right breast lumpectomy and adjuvant radiation. -I previously reviewed her Oncotype DX result. Her recurrence score is 22, which predicts 14% 10 year risk of distant recurrence with tamoxifen alone. This is intermediate risk, based on her relatively low number, I do not recommend adjuvant chemotherapy. -She started anti-estrogen therapy with letrozole in 05/2016. She is tolerating well, we'll continue for a total of 5-7 years  -We previously discussed breast cancer surveillance, I strongly encouraged her to continue annual screening mammogram, self exam, and follow-up with [place] routinely. -Last Mammogram in 12/2017 was NED. Next in 12/2018  3. History of PE (01/06/2014) -likely secondary to malignancy in the setting of family history for stroke (  and possible stroke history in patient as well). -Continue Xarelto. Due to her metastatic colon cancer, I previously recommended her to continue indefinitely.  4. Uterine Fibroids  -Negative endometrial biopsy. Following with Gynecology.  5. Kidney lesion (Right) -No hypermetabolic right kidney lesion on the pet scan -Repeat abdominal MRI 05/29/2015 showed unchanged right renal lesion, favored to represent a complex/septate  cyst. -Stable   6. Right flank pain, left Knee discomfort  -possibly related to her liver metastasis -I previously suggested she see a orthopedics for her knee issues.  --Left knee pain has improved but right flank pain is stable.    7. HTN  -Continue Amlodipine, lopressor and HCTZ  -stable   8. Hypokalemia  -Potassium normalized - Continue oral K BID  9. Depression -Patient acknowledges that her depression has getting worse since the recent cancer progression -We discussed anti-depression medication.  She also has low appetite, and insomnia, I will start her on mirtazapine 7.5 mg at bedtime, and titrate dose as needed.   Plan  - I will order antidepressant medication mirtazapine 7.77m HS  - I will refer her to SW for depression counseling -Lab next Monday for monitoring on LOnsurf -f/u in 2 weeks   No problem-specific Assessment & Plan notes found for this encounter.   No orders of the defined types were placed in this encounter.  All questions were answered. The patient knows to call the clinic with any problems, questions or concerns. No barriers to learning was detected. I spent 20 minutes counseling the patient face to face. The total time spent in the appointment was 25 minutes and more than 50% was on counseling and review of test results  I, DManson Allanam acting as scribe for Dr. YTruitt Merle  I have reviewed the above documentation for accuracy and completeness, and I agree with the above.     YTruitt Merle MD 10/20/2018

## 2018-10-19 ENCOUNTER — Inpatient Hospital Stay: Payer: BLUE CROSS/BLUE SHIELD

## 2018-10-19 ENCOUNTER — Inpatient Hospital Stay: Payer: BLUE CROSS/BLUE SHIELD | Attending: Hematology | Admitting: Hematology

## 2018-10-19 VITALS — BP 144/85 | HR 80 | Temp 99.0°F | Resp 18 | Ht 66.0 in | Wt 284.1 lb

## 2018-10-19 DIAGNOSIS — I10 Essential (primary) hypertension: Secondary | ICD-10-CM

## 2018-10-19 DIAGNOSIS — R1030 Lower abdominal pain, unspecified: Secondary | ICD-10-CM

## 2018-10-19 DIAGNOSIS — M545 Low back pain: Secondary | ICD-10-CM

## 2018-10-19 DIAGNOSIS — G4700 Insomnia, unspecified: Secondary | ICD-10-CM | POA: Diagnosis not present

## 2018-10-19 DIAGNOSIS — R630 Anorexia: Secondary | ICD-10-CM | POA: Diagnosis not present

## 2018-10-19 DIAGNOSIS — C7800 Secondary malignant neoplasm of unspecified lung: Secondary | ICD-10-CM | POA: Diagnosis not present

## 2018-10-19 DIAGNOSIS — Z86711 Personal history of pulmonary embolism: Secondary | ICD-10-CM | POA: Diagnosis not present

## 2018-10-19 DIAGNOSIS — R197 Diarrhea, unspecified: Secondary | ICD-10-CM

## 2018-10-19 DIAGNOSIS — Z86718 Personal history of other venous thrombosis and embolism: Secondary | ICD-10-CM

## 2018-10-19 DIAGNOSIS — Z170 Estrogen receptor positive status [ER+]: Secondary | ICD-10-CM

## 2018-10-19 DIAGNOSIS — C187 Malignant neoplasm of sigmoid colon: Secondary | ICD-10-CM | POA: Diagnosis not present

## 2018-10-19 DIAGNOSIS — Z95828 Presence of other vascular implants and grafts: Secondary | ICD-10-CM

## 2018-10-19 DIAGNOSIS — C50511 Malignant neoplasm of lower-outer quadrant of right female breast: Secondary | ICD-10-CM | POA: Diagnosis not present

## 2018-10-19 DIAGNOSIS — E876 Hypokalemia: Secondary | ICD-10-CM | POA: Diagnosis not present

## 2018-10-19 DIAGNOSIS — F329 Major depressive disorder, single episode, unspecified: Secondary | ICD-10-CM

## 2018-10-19 DIAGNOSIS — C787 Secondary malignant neoplasm of liver and intrahepatic bile duct: Secondary | ICD-10-CM

## 2018-10-19 LAB — CBC WITH DIFFERENTIAL (CANCER CENTER ONLY)
Abs Immature Granulocytes: 0.05 10*3/uL (ref 0.00–0.07)
BASOS PCT: 0 %
Basophils Absolute: 0 10*3/uL (ref 0.0–0.1)
Eosinophils Absolute: 0.1 10*3/uL (ref 0.0–0.5)
Eosinophils Relative: 1 %
HCT: 38.2 % (ref 36.0–46.0)
Hemoglobin: 12.6 g/dL (ref 12.0–15.0)
Immature Granulocytes: 1 %
Lymphocytes Relative: 18 %
Lymphs Abs: 1.4 10*3/uL (ref 0.7–4.0)
MCH: 29.2 pg (ref 26.0–34.0)
MCHC: 33 g/dL (ref 30.0–36.0)
MCV: 88.4 fL (ref 80.0–100.0)
Monocytes Absolute: 0.3 10*3/uL (ref 0.1–1.0)
Monocytes Relative: 3 %
Neutro Abs: 5.9 10*3/uL (ref 1.7–7.7)
Neutrophils Relative %: 77 %
PLATELETS: 325 10*3/uL (ref 150–400)
RBC: 4.32 MIL/uL (ref 3.87–5.11)
RDW: 15.9 % — ABNORMAL HIGH (ref 11.5–15.5)
WBC Count: 7.8 10*3/uL (ref 4.0–10.5)
nRBC: 0.3 % — ABNORMAL HIGH (ref 0.0–0.2)

## 2018-10-19 LAB — CMP (CANCER CENTER ONLY)
ALT: 35 U/L (ref 0–44)
ANION GAP: 10 (ref 5–15)
AST: 46 U/L — ABNORMAL HIGH (ref 15–41)
Albumin: 3 g/dL — ABNORMAL LOW (ref 3.5–5.0)
Alkaline Phosphatase: 150 U/L — ABNORMAL HIGH (ref 38–126)
BILIRUBIN TOTAL: 0.8 mg/dL (ref 0.3–1.2)
BUN: 9 mg/dL (ref 8–23)
CO2: 27 mmol/L (ref 22–32)
Calcium: 10 mg/dL (ref 8.9–10.3)
Chloride: 98 mmol/L (ref 98–111)
Creatinine: 0.85 mg/dL (ref 0.44–1.00)
GFR, Est AFR Am: >60 mL/min (ref >60)
GFR, Estimated: >60 mL/min (ref >60)
Glucose, Bld: 149 mg/dL — ABNORMAL HIGH (ref 70–99)
Potassium: 3.2 mmol/L — ABNORMAL LOW (ref 3.5–5.1)
Sodium: 135 mmol/L (ref 135–145)
Total Protein: 8.3 g/dL — ABNORMAL HIGH (ref 6.5–8.1)

## 2018-10-19 MED ORDER — HEPARIN SOD (PORK) LOCK FLUSH 100 UNIT/ML IV SOLN
500.0000 [IU] | Freq: Once | INTRAVENOUS | Status: AC | PRN
  Administered 2018-10-19: 500 [IU] via INTRAVENOUS
  Filled 2018-10-19: qty 5

## 2018-10-19 MED ORDER — SODIUM CHLORIDE 0.9% FLUSH
10.0000 mL | INTRAVENOUS | Status: DC | PRN
  Administered 2018-10-19: 10 mL via INTRAVENOUS
  Filled 2018-10-19: qty 10

## 2018-10-19 MED ORDER — MIRTAZAPINE 7.5 MG PO TABS: 7.5000 mg | ORAL_TABLET | Freq: Every day | ORAL | 1 refills | Status: AC

## 2018-10-20 ENCOUNTER — Telehealth: Payer: Self-pay | Admitting: Hematology

## 2018-10-20 ENCOUNTER — Encounter: Payer: Self-pay | Admitting: Hematology

## 2018-10-20 NOTE — Telephone Encounter (Signed)
Scheduled appt per 02/04 los.  Called patient and patient is aware of appt time and date.

## 2018-10-21 ENCOUNTER — Other Ambulatory Visit: Payer: BLUE CROSS/BLUE SHIELD

## 2018-10-21 ENCOUNTER — Ambulatory Visit: Payer: BLUE CROSS/BLUE SHIELD | Admitting: Hematology

## 2018-10-26 ENCOUNTER — Inpatient Hospital Stay: Payer: BLUE CROSS/BLUE SHIELD

## 2018-10-26 DIAGNOSIS — C187 Malignant neoplasm of sigmoid colon: Secondary | ICD-10-CM

## 2018-10-26 DIAGNOSIS — Z95828 Presence of other vascular implants and grafts: Secondary | ICD-10-CM

## 2018-10-26 LAB — CBC WITH DIFFERENTIAL (CANCER CENTER ONLY)
Abs Immature Granulocytes: 0.02 10*3/uL (ref 0.00–0.07)
Basophils Absolute: 0 10*3/uL (ref 0.0–0.1)
Basophils Relative: 0 %
Eosinophils Absolute: 0.1 10*3/uL (ref 0.0–0.5)
Eosinophils Relative: 1 %
HCT: 34.8 % — ABNORMAL LOW (ref 36.0–46.0)
Hemoglobin: 11.1 g/dL — ABNORMAL LOW (ref 12.0–15.0)
Immature Granulocytes: 0 %
LYMPHS PCT: 25 %
Lymphs Abs: 1.4 10*3/uL (ref 0.7–4.0)
MCH: 28.5 pg (ref 26.0–34.0)
MCHC: 31.9 g/dL (ref 30.0–36.0)
MCV: 89.2 fL (ref 80.0–100.0)
Monocytes Absolute: 0.1 10*3/uL (ref 0.1–1.0)
Monocytes Relative: 2 %
NEUTROS PCT: 72 %
Neutro Abs: 4 10*3/uL (ref 1.7–7.7)
Platelet Count: 287 10*3/uL (ref 150–400)
RBC: 3.9 MIL/uL (ref 3.87–5.11)
RDW: 16.1 % — ABNORMAL HIGH (ref 11.5–15.5)
WBC Count: 5.7 10*3/uL (ref 4.0–10.5)
nRBC: 0.4 % — ABNORMAL HIGH (ref 0.0–0.2)

## 2018-10-26 LAB — CMP (CANCER CENTER ONLY)
ALT: 21 U/L (ref 0–44)
AST: 38 U/L (ref 15–41)
Albumin: 3.1 g/dL — ABNORMAL LOW (ref 3.5–5.0)
Alkaline Phosphatase: 112 U/L (ref 38–126)
Anion gap: 10 (ref 5–15)
BUN: 12 mg/dL (ref 8–23)
CHLORIDE: 101 mmol/L (ref 98–111)
CO2: 24 mmol/L (ref 22–32)
Calcium: 10 mg/dL (ref 8.9–10.3)
Creatinine: 0.82 mg/dL (ref 0.44–1.00)
GFR, Est AFR Am: >60 mL/min (ref >60)
GFR, Estimated: >60 mL/min (ref >60)
Glucose, Bld: 152 mg/dL — ABNORMAL HIGH (ref 70–99)
POTASSIUM: 3.7 mmol/L (ref 3.5–5.1)
Sodium: 135 mmol/L (ref 135–145)
Total Bilirubin: 0.7 mg/dL (ref 0.3–1.2)
Total Protein: 8.1 g/dL (ref 6.5–8.1)

## 2018-10-26 LAB — CEA (IN HOUSE-CHCC): CEA (CHCC-In House): 150.04 ng/mL — ABNORMAL HIGH (ref 0.00–5.00)

## 2018-10-26 MED ORDER — HEPARIN SOD (PORK) LOCK FLUSH 100 UNIT/ML IV SOLN
500.0000 [IU] | Freq: Once | INTRAVENOUS | Status: AC | PRN
  Administered 2018-10-26: 500 [IU] via INTRAVENOUS
  Filled 2018-10-26: qty 5

## 2018-10-26 MED ORDER — SODIUM CHLORIDE 0.9% FLUSH
10.0000 mL | INTRAVENOUS | Status: DC | PRN
  Administered 2018-10-26: 10 mL via INTRAVENOUS
  Filled 2018-10-26: qty 10

## 2018-11-03 ENCOUNTER — Telehealth: Payer: Self-pay

## 2018-11-03 ENCOUNTER — Ambulatory Visit: Payer: BLUE CROSS/BLUE SHIELD | Admitting: Hematology

## 2018-11-03 ENCOUNTER — Ambulatory Visit: Payer: BLUE CROSS/BLUE SHIELD

## 2018-11-03 ENCOUNTER — Other Ambulatory Visit: Payer: BLUE CROSS/BLUE SHIELD

## 2018-11-03 ENCOUNTER — Other Ambulatory Visit: Payer: Self-pay | Admitting: Hematology

## 2018-11-03 ENCOUNTER — Telehealth: Payer: Self-pay | Admitting: *Deleted

## 2018-11-03 ENCOUNTER — Other Ambulatory Visit: Payer: Self-pay

## 2018-11-03 DIAGNOSIS — I10 Essential (primary) hypertension: Secondary | ICD-10-CM

## 2018-11-03 DIAGNOSIS — C187 Malignant neoplasm of sigmoid colon: Secondary | ICD-10-CM

## 2018-11-03 MED ORDER — TRIFLURIDINE-TIPIRACIL 20-8.19 MG PO TABS: ORAL_TABLET | ORAL | 2 refills | Status: DC

## 2018-11-03 NOTE — Telephone Encounter (Signed)
Oral Oncology Patient Advocate Encounter  [place] contacted me with a rejection on her Lonsurf. I found that the insurance is not paying any portion of the cost. I called [employer] and they informed me that the patient is in a grace period because she has not paid her premiums, this make her responsible for 100% of the cost.  I called the patient to give her this information, she said that her premium is $900 and she is working with [employer] now trying to figure something out. She will call me back when she has more information. I let the pharmacy know what was going on.  I will continue to update  [place] [facility] [place] Phone [phone] Fax [phone]

## 2018-11-03 NOTE — Telephone Encounter (Signed)
"  Veronica Reyes (), I haven't heard anything about my chemotherapy refill.  The doctor knows he name of it.  Supposed to start again in the next two weeks." Refills noted per EPIC orders; may call pharmacy to requests refills.

## 2018-11-04 NOTE — Telephone Encounter (Signed)
See Oral Chemotherapy Pharmacy note.  Nurse encounter closed.

## 2018-11-05 ENCOUNTER — Inpatient Hospital Stay: Payer: BLUE CROSS/BLUE SHIELD

## 2018-11-05 ENCOUNTER — Telehealth: Payer: Self-pay | Admitting: Hematology

## 2018-11-05 ENCOUNTER — Inpatient Hospital Stay: Payer: BLUE CROSS/BLUE SHIELD | Admitting: Hematology

## 2018-11-05 NOTE — Telephone Encounter (Signed)
YF AM appointment. Moved lab/port/fu to 11:45 am. Spoke with patient.

## 2018-11-08 NOTE — Progress Notes (Signed)
Veronica Reyes   Telephone:([phone] Fax:([phone]) [phone]   Clinic Follow up Note   Patient Care Team: [name], MD as PCP - General (Internal Medicine) Leighton Ruff, MD as Consulting Physician (General Surgery) Carol Ada, MD as Consulting Physician (Gastroenterology) Concha Norway, MD (Inactive) as Consulting Physician (Internal Medicine) Emily Filbert, MD as Consulting Physician (Obstetrics and Gynecology) Stark Klein, MD as Consulting Physician (General Surgery) [name], MD as Consulting Physician (Hematology) Jonnie Finner, RN as Registered Nurse 11/09/2018  CHIEF COMPLAINT: F/u colon cancer and h/o of breast cancer   SUMMARY OF ONCOLOGIC HISTORY: Oncology History   Breast cancer of lower-outer quadrant of right female breast [facility])   Staging form: Breast, AJCC 7th Edition     Clinical stage from 02/01/2015: Stage IA (T1a, N0, M0) - Signed by [name], MD on 06/04/2015     Pathologic stage from 11/10/2015: Stage IA (T1b, N0, M0) - Signed by [name], MD on 12/08/2015 Cancer of sigmoid colon [facility])   Staging form: Colon and Rectum, AJCC 7th Edition     Clinical: Stage I (T2, N0, M0) - Unsigned     Breast cancer of lower-outer quadrant of right female breast ([place])   2006 Cancer Diagnosis    Left breast DCIS diagnosed in 2006 and 2010, status post lumpectomy.    01/07/2014 Initial Diagnosis    Breast cancer    01/18/2015 Mammogram    56m mass in lower outer quadrant of right breast     02/01/2015 Initial Biopsy    (R) breast mass biopsy showed invasive ductal carcinoma & DCIS, grade 1-2.     02/01/2015 Receptors her2    ER 90%+, PR 10%+, Ki67 10%, HER2 negative (ratio 1.09)     11/07/2015 Surgery    (R) breast lumpectomy and sentinel lymph node biopsy ([name]    11/07/2015 Pathology Results    (R) breast lumpectomy showed invasive ductal carcinoma, grade 2, 1 cm, low-grade DCIS, negative margins, 3 sentinel lymph nodes were negative. LVI  (-). HER2 repeated and remains negative (ratio 1.29).      11/07/2015 Pathologic Stage    pT1b,pN0: Stage IA     11/07/2015 Oncotype testing    RS 22, which predicts a year risk of distant recurrence with tamoxifen alone 14% (intermediate-risk). Adjuvant chemo was not offered    02/21/2016 - 04/10/2016 Radiation Therapy    Adjuvant breast radiation. ([name]). Right breast: 50.4 Gy in 28 fractions.  Right breast boost: 12 Gy in 6 fractions.    05/2016 -  Anti-estrogen oral therapy    Femara (Letrozole) 2.5 mg daily.     10/17/2016 Mammogram    MM DIAG BREAST TOMO BILATERAL 10/17/2016 FINDINGS: There are bilateral lumpectomy changes in the upper central left breast and upper central right breast. No mass, nonsurgical distortion, or suspicious microcalcification is identified in either breast to suggest malignancy. Mammographic images were processed with CAD. IMPRESSION: No evidence of malignancy in either breast. Lumpectomy changes bilaterally. RECOMMENDATION: Diagnostic mammogram is suggested in 1 year. (Code:[id number])    12/16/2017 Mammogram    IMPRESSION: No mammographic evidence of malignancy in either breast, status post bilateral mastectomies. RECOMMENDATION: Diagnostic mammogram is suggested in 1 year.      Cancer of sigmoid colon ([place]   03/04/2014 Initial Diagnosis    Cancer of sigmoid colon    03/09/2014 Pathology Results    G1 invasive adenocarcinoma, no lymph-Vascular invasion or Peri-neural invasion: Absent. MMR normal, MSI stable.  03/09/2014 Surgery    partial colectomy, negative margins.     11/10/2014 Relapse/Recurrence    biopsy confirmed oligo liver recurrence     11/10/2014 Imaging    PET showed two small ares of hypermetabolism in the right lobe of the liver, no other distant metastasis.     11/30/2014 Imaging    abdomen MRI showed a new enhancing lesion which corresponds to an area of abnormal hyper metabolism on recent PET-CT. Stable enlarged portal caval lymph  node.     01/02/2015 Pathology Results    Liver biopsy showed metastatic adenocarcinoma, IHC (+) for CK20, CDX-2, (-) for TTF-1 and ER    01/02/2015 Miscellaneous    liver biopsy KRAS mutation (+)     02/14/2015 Imaging    CT showed:  the small metastatic right hepatic lobe liver lesion is not identified for certain on this examination. No new lesions. Stable small cysts in segment 4 a.     03/05/2015 - 04/16/2015 Chemotherapy    CAPEOX (capecitabine 2500 mg twice daily Day 1-14, oxaliplatin 1 30 mg/m on day 1, every 21 days, S/P 3 cycles     08/24/2015 Procedure    CT-guided oligo liver metastasis microwave ablation by Dr. [name]    06/17/2016 Imaging    CT of the chest/abd/pelvis with contrast 1. Several new small pulmonary nodules worrisome for metastatic disease. 2. New large area of probable infiltrating recurrent tumor in the right hepatic lobe surrounding the prior ablation site. 3. Small supraclavicular lymph nodes.  Attention on future studies. 4. Stable surgical changes involving both breasts. No breast masses are identified. 5. Stable bilateral adrenal gland nodules. 6. Stable upper abdominal and right-sided retroperitoneal lymph nodes.    07/08/2016 PET scan    1. Large mass in the right lobe of the liver demonstrates diffuse hypermetabolism, compatible with recurrent metastatic disease in the liver 2. Multiple small pulmonary nodules are very similar to the recent Chest CT 06/17/16.  3. No other findings of metastatic disease elsewhere in the neck, chest, abdomen, or pelvis. 4. There is some low-level hypermetabolic activity adjacent to the surgical clips in the lateral aspect of the right breast at the site of prior lumpectomy.     07/29/2016 Pathology Results    Liver biopsy confirmed metastatic colon cancer     07/31/2016 - 07/31/2017 Chemotherapy    Chemotherapy FOLFIRI and Avastin (from cycle 2) and Leucovorin (added from cycle 8) every 2 weeks, started on  07/31/2016, multiple delay per pt's request; chemo break from 5/30-6/21/18; decreased leucovorin to 2000 mg/m2, irinotecan to 162m/m2, 5-fu 20014mm2 from 03/05/17, due to poor tolerance. Started chemo break since 07/31/17.     10/23/2016 Imaging    CT ABDOMEN PELVIS W CONTRAST 10/24/16 IMPRESSION: 1. The numerous tiny bilateral pulmonary nodules seen on the previous imaging exams are no longer evident, suggesting response of therapy. 2. Large ill-defined irregular right hepatic lesion measures smaller on today's study. 3. Slight increase in size of hepatoduodenal ligament lymphadenopathy. 4. Abdominal Aortic Atherosclerois (ICD10-170.0)    01/05/2017 Imaging    CT CAP IMPRESSION: 1. Left lower lobe pulmonary nodule and hepatic metastatic disease are stable. 2. Hepatoduodenal ligament lymph node is stable. 3. Aortic atherosclerosis (ICD10-170.0). Coronary artery calcification. 4. Enlarged pulmonary arteries, indicative of pulmonary arterial hypertension. 5. Small bilateral adrenal nodules are unchanged.    05/09/2017 Imaging    CT CAP IMPRESSION: 1. Stable appearance left lower lobe pulmonary nodule with an apparent new tiny posterior right upper lobe pulmonary nodule.  2. Dominant ill-defined right hepatic mass measures smaller on today's study. 3. Slight decrease in the hepatoduodenal ligament lymph node. 4. Aortic Atherosclerois (ICD10-170.0)    05/09/2017 Imaging    CT CAP 05/09/17 IMPRESSION: 1. Stable appearance left lower lobe pulmonary nodule with an apparent new tiny posterior right upper lobe pulmonary nodule. Continued attention on follow-up recommended. 2. Dominant ill-defined right hepatic mass measures smaller on today's study. 3. Slight decrease in the hepatoduodenal ligament lymph node. 4.  Aortic Atherosclerois (ICD10-170.0)     08/11/2017 Imaging    CT CAP W Contrast 08/11/17 IMPRESSION: 1. Essentially stable appearance of small pulmonary nodules, hypodense mass  laterally in the right hepatic lobe, and mild portacaval adenopathy. No new or progressive lesions. 2. Other imaging findings of potential clinical significance: Aortic Atherosclerosis (ICD10-I70.0). Lower lumbar impingement due to spondylosis and degenerative disc disease. Uterine fibroids. Bilateral renal cysts. Coronary atherosclerosis. Mild mitral valve calcification. Mild cardiomegaly.      09/24/2017 - 12/04/2017 Chemotherapy    Maintenance Xeloda 2500 mg twice daily days 1 through 14 of each cycle of treatment and Avastin given every 3 weeks starting on 09/24/2017. Due to slight progression this was stopped on 12/04/17     12/02/2017 Imaging    CT CAP W Contrast 12/02/17 IMPRESSION: Chest Impression: 1. Interval increase in size of bilateral small pulmonary nodules. 2. No mediastinal adenopathy  Abdomen / Pelvis Impression: 1. Dominant lesion in the RIGHT hepatic lobe is similar; however there are 2 adjacent low-density lesions in the RIGHT hepatic lobe which are not appreciated on comparison exam and concerning recurrent hepatic metastasis. 2. Interval increase in size periaortic retroperitoneal lymph nodes consistent with recurrent metastatic adenopathy.    01/08/2018 - 08/24/2018 Chemotherapy    Due to slight disease progression she restarted on Irinotecan plus Avastin every 2 weeks on 01/08/18. Added 5-Fu bolus and Leucovorin on 04/13/18. Stopped on 08/24/18 due to disease progression.     04/09/2018 Imaging    04/09/2018 CT CAP W Contrast IMPRESSION: 1. Slight interval increase in size as well as development of new low-attenuation lesions within the liver. 2. Grossly unchanged bilateral pulmonary nodules. 3. Similar-appearing retroperitoneal adenopathy. 4. Small amount of gas within the endometrium of the uterus, nonspecific in etiology. Recommend clinical correlation for the possibility of infectious symptoms. Consider further evaluation with pelvic ultrasound as  indicated.     06/07/2018 Imaging    06/07/2018 CT CAP IMPRESSION: Chest Impression:  1. Stable bilateral pulmonary nodules. 2. Single new pulmonary nodule in the RIGHT lower lobe with ill-defined borders. Recommend short-term follow-up CT (6-8 weeks) as this may represent a focus of pulmonary infection.  Abdomen / Pelvis Impression:  1. Stable hepatic metastasis in the RIGHT hepatic lobe. No new lesions. 2. Small periaortic retroperitoneal lymph nodes are unchanged. 3. Sigmoid anastomosis without local recurrence. 4. Mild nodular contour of liver could indicate early cirrhosis.    09/21/2018 Imaging    CT CAP IMPRESSION: 1. Interval progression of metastatic disease. Numerous bilateral pulmonary metastases have increased in size. Liver metastases have increased in size and number. New mild left axillary and central mesenteric lymphadenopathy. Increased bilateral adrenal gland thickening suspicious for growing small adrenal metastases. 2. Mild cardiomegaly. Stable dilated main pulmonary artery suggesting chronic pulmonary arterial hypertension. 3. Chronic findings include: Aortic Atherosclerosis (ICD10-I70.0). Stable ectatic 4.2 cm ascending thoracic aorta. Three-vessel coronary atherosclerosis. Enlarged myomatous uterus.      10/11/2018 -  Chemotherapy    Third-line Lonsurf M-F 2 weeks on, 2 weeks off  starting 10/11/18      CURRENT THERAPY  1. Letrozole 2.5 mg once daily, started in September 2017 2.Third-line Lonsurf M-F 2 weeks on, 2 weeks off, started 10/11/18  INTERVAL HISTORY: Veronica Reyes is a 63 y.o. female who is here for follow-up. Her insuance was not paying any portion of the cost for Lonsurf until she pays her premium. Pt is trying to figure out something with her insurance company. Today, she is here with her sister. She missed her last appointment due to the snow. Since starting chemotherapy pill, she first experienced some nausea but that is  now resolved. Her lower abdominal pain and back pain has improved and doesn't need pain medication as much. She takes about 3 pills a day instead of 5 a day. She is feeling constipated.  Her energy level is improving, and is able to walk short distances.   Pertinent positives and negatives of review of systems are listed and detailed within the above HPI.  REVIEW OF SYSTEMS:   Constitutional: Denies fevers, chills or abnormal weight loss, (+) low energy, (+) low appetite  Eyes: Denies blurriness of vision Ears, nose, mouth, throat, and face: Denies mucositis or sore throat Respiratory: Denies cough, dyspnea or wheezes Cardiovascular: Denies palpitation, chest discomfort or lower extremity swelling Gastrointestinal:  Denies nausea, heartburn, (+) constipation  Skin: Denies abnormal skin rashes, (+) mild lower abdominal pain  Lymphatics: Denies new lymphadenopathy or easy bruising Neurological:Denies numbness, tingling or new weaknesses Behavioral/Psych: Mood is stable, no new changes  Musculoskeletal: (+) mild lower back pain  All other systems were reviewed with the patient and are negative.  MEDICAL HISTORY:  Past Medical History:  Diagnosis Date  . Anxiety   . Breast CA ([place])    radiation and surgery-lt.  . Cancer ([place])    left breast cancer x2  . Hypertension   . Liver lesion   . Personal history of chemotherapy   . Personal history of radiation therapy 2017  . Pulmonary embolism ([place])    "blood clot in lungs" -dx. 4'15 with CT Chest. On Xarelto  . Radiation 01/22/10-03/12/10   left whole breast 4500 cGy, upper aspect boosted to 6120 cGy  . Radiation 02/21/16 - 04/10/16   right breast 50.4 Gy, right breast boost 12 Gy  . Shortness of breath    sob with exertion. dx. with Pulmonary emboli 4'15 ,tx. Xarelto,Lovenox used for [employer].    SURGICAL HISTORY: Past Surgical History:  Procedure Laterality Date  . BREAST BIOPSY Left 2010   malignant  . BREAST BIOPSY Right 01/2015    malignant  . BREAST LUMPECTOMY Left 2006  . BREAST LUMPECTOMY Left 2010  . BREAST LUMPECTOMY Right 11/07/2015  . BREAST LUMPECTOMY WITH RADIOACTIVE SEED AND SENTINEL LYMPH NODE BIOPSY Right 11/07/2015   Procedure: BREAST LUMPECTOMY WITH RADIOACTIVE SEED AND SENTINEL LYMPH NODE BIOPSY;  Surgeon: Stark Klein, MD;  Location: [facility];  Service: General;  Laterality: Right;  . BREAST SURGERY     '11- Left breast lumpectomy  . CESAREAN SECTION    . CHOLECYSTECTOMY     Laparoscopic 20 yrs ago  . COLON SURGERY  03-09-14   partial colectomy for cancer  . IR GENERIC HISTORICAL  07/29/2016   IR FLUORO GUIDE PORT INSERTION RIGHT 07/29/2016 Sandi Mariscal, MD [facility]  . IR GENERIC HISTORICAL  07/29/2016   IR US GUIDE VASC ACCESS RIGHT 07/29/2016 Sandi Mariscal, MD [facility]  . IR GENERIC HISTORICAL  07/29/2016   IR US  GUIDE BX ASP/DRAIN 07/29/2016 [facility]  . PARTIAL COLECTOMY N/A 03/09/2014   Procedure: LAPAROSCOPIC ASSISTED PARTIAL COLECTOMY, splenic flexure mobilization;  Surgeon: Leighton Ruff, MD;  Location: [facility];  Service: General;  Laterality: N/A;  . RADIOFREQUENCY ABLATION Right 08/24/2015   Procedure: MICROWAVE ABLATION RIGHT LIVER LOBE ;  Surgeon: Corrie Mckusick, DO;  Location: [facility];  Service: Anesthesiology;  Laterality: Right;    I have reviewed the social history and family history with the patient and they are unchanged from previous note.  ALLERGIES:  is allergic to tramadol.  MEDICATIONS:  Current Outpatient Medications  Medication Sig Dispense Refill  . acetaminophen-codeine (TYLENOL #4) 300-60 MG tablet Take 1 tablet by mouth every 4 (four) hours as needed. for pain 90 tablet 0  . amLODipine (NORVASC) 5 MG tablet TAKE 1 TABLET BY MOUTH EVERY DAY 30 tablet 3  . ferrous sulfate 325 (65 FE) MG tablet Take 325 mg by mouth daily with breakfast. Pt takes  4 times per week.    . hydrochlorothiazide (HYDRODIURIL) 25 MG tablet TAKE 1 TABLET BY MOUTH in THE  MORNING 30 tablet 3  . letrozole (FEMARA) 2.5 MG tablet TAKE 1 TABLET BY MOUTH EVERY DAY 30 tablet 3  . lidocaine-prilocaine (EMLA) cream Apply 1 application topically as needed. 30 g 2  . LORazepam (ATIVAN) 0.5 MG tablet Take 1 tablet (0.5 mg total) by mouth every 8 (eight) hours. 30 tablet 0  . metoprolol tartrate (LOPRESSOR) 50 MG tablet Take 1 tablet (50 mg total) by mouth 2 (two) times daily. 60 tablet 1  . metoprolol tartrate (LOPRESSOR) 50 MG tablet TAKE 1 TABLET BY MOUTH 2 TIMES DAILY 60 tablet 2  . mirtazapine (REMERON) 7.5 MG tablet Take 1 tablet (7.5 mg total) by mouth at bedtime. 30 tablet 1  . ondansetron (ZOFRAN) 8 MG tablet Take 1 tablet (8 mg total) by mouth 2 (two) times daily as needed for refractory nausea / vomiting. Start on day 3 after chemotherapy. 30 tablet 1  . potassium chloride SA (K-DUR,KLOR-CON) 20 MEQ tablet TAKE 1 TABLET BY MOUTH 2 TIMES DAILY 60 tablet 2  . prochlorperazine (COMPAZINE) 10 MG tablet Take 1 tablet (10 mg total) by mouth every 6 (six) hours as needed (NAUSEA). 30 tablet 2  . promethazine (PHENERGAN) 25 MG tablet Take 1 tablet (25 mg total) by mouth every 6 (six) hours as needed for nausea or vomiting. 30 tablet 2  . trifluridine-tipiracil (LONSURF) 20-8.19 MG tablet Take 4 tablets (17m trifluridine) by mouth twice daily, within 1 hr after AM & PM meals on days 1-5 & 8-12 of each 28 day cycle 80 tablet 2  . XARELTO 20 MG TABS tablet TAKE 1 TABLET BY MOUTH EVERY DAY 30 tablet 3  . zolpidem (AMBIEN) 5 MG tablet Take 1 tablet (5 mg total) by mouth at bedtime as needed for sleep. 20 tablet 0   No current facility-administered medications for this visit.     PHYSICAL EXAMINATION: ECOG PERFORMANCE STATUS: 2 - Symptomatic, <50% confined to bed  Vitals:   11/09/18 1244  BP: 131/88  Pulse: 67  Resp: 19  Temp: 99.5 F (37.5 C)  SpO2: 100%   Filed Weights   11/09/18 1244  Weight: 282 lb 1.6 oz (128 kg)    GENERAL:alert, no distress and  comfortable SKIN: skin color, texture, turgor are normal, no rashes or significant lesions EYES: normal, Conjunctiva are pink and non-injected, sclera clear OROPHARYNX:no exudate, no erythema and lips, buccal mucosa, and tongue normal  NECK: supple, thyroid normal size, non-tender, without nodularity LYMPH:  no palpable lymphadenopathy in the cervical, axillary or inguinal LUNGS: clear to auscultation and percussion with normal breathing effort HEART: regular rate & rhythm and no murmurs and no lower extremity edema ABDOMEN:abdomen soft, non-tender, (+)  Sluggish bowel sounds Musculoskeletal:no cyanosis of digits and no clubbing  NEURO: alert & oriented x 3 with fluent speech, no focal motor/sensory deficits  LABORATORY DATA:  I have reviewed the data as listed CBC Latest Ref Rng & Units 11/09/2018 10/26/2018 10/19/2018  WBC 4.0 - 10.5 K/uL 4.1 5.7 7.8  Hemoglobin 12.0 - 15.0 g/dL 11.9(L) 11.1(L) 12.6  Hematocrit 36.0 - 46.0 % 38.4 34.8(L) 38.2  Platelets 150 - 400 K/uL 314 287 325     CMP Latest Ref Rng & Units 11/09/2018 10/26/2018 10/19/2018  Glucose 70 - 99 mg/dL 176(H) 152(H) 149(H)  BUN 8 - 23 mg/dL 7(L) 12 9  Creatinine 0.44 - 1.00 mg/dL 0.82 0.82 0.85  Sodium 135 - 145 mmol/L 136 135 135  Potassium 3.5 - 5.1 mmol/L 3.7 3.7 3.2(L)  Chloride 98 - 111 mmol/L 100 101 98  CO2 22 - 32 mmol/L '24 24 27  ' Calcium 8.9 - 10.3 mg/dL 9.7 10.0 10.0  Total Protein 6.5 - 8.1 g/dL 8.1 8.1 8.3(H)  Total Bilirubin 0.3 - 1.2 mg/dL 1.4(H) 0.7 0.8  Alkaline Phos 38 - 126 U/L 242(H) 112 150(H)  AST 15 - 41 U/L 177(HH) 38 46(H)  ALT 0 - 44 U/L 202(H) 21 35      RADIOGRAPHIC STUDIES: I have personally reviewed the radiological images as listed and agreed with the findings in the report. No results found.   ASSESSMENT & PLAN:  Veronica Reyes is a 62 y.o. female with history of  1. Colon Cancer, Stage I (pT2N0), MMR normal, oligo recurrent disease in liver in 12/2014, KRAS mutation  (+), liver and lung recurrence in 06/2016 -She was initially diagnosed with sigmoid colon cancer in 02/2014. She was treated with partial colectomy.  -Unfortunately she had cancer recurrence to the liver in diagnosed in 12/2014. She was treated with CAPOX but only completed 3 cycles due to moderate side effects.  -She declined liver mass resection, and underwent liver ablation by interventional radiologist Dr. Earleen Newport in 08/2015.Since then she has progressed on maintenance Xeloda -Treatment wwith Irinotecan and Avastin every 2 weeks since 12/2017  -Her CT CAP from 09/21/18 showed progression with numerous b/l lung mets, liver mets and new left axillary and centralmesentericlymph nodes. Irinotecan/5-fu bolus/Avastin was stopped due to disease progression  -she has started 4th line oral chemo Lonsurf M-F 2 weeks on, 2 weeks off. She experienced severe fatigue, but recovered some.  She reports her abdominal pain has much improved, hopefully she is responding to treatment. -continue Lonsurf.  Unfortunately she lost her insurance-she is not able to afford the high premium.  She is probably going to apply for Medicaid. -Our oral pharmacist is helping her to obtain manufacture assistance on Lonsurf, she will start as soon as she receives it. -Lab reviewed, she has developed transaminitis and mild hyperbilirubinemia, will repeat lab before she started cycle 2 Lonsurf -f/u in 4 weeks    2. Right breast cancer, pT1bN0M0, stage Ia, ER+/PR+/HER2-, History of left Breast Cancer -She was diagnosed withLeftbreast DCISon 02/01/15. She is s/pleftlumpectomy in 2006 and 2011 by Dr. Margot Chimes, and radiation in 2011 by Dr. Sondra Come.  -She was diagnosed with right breast invasive ductal carcinoma and DCIS in 01/2015. She is s/p  right breast lumpectomy and adjuvant radiation. -She started anti-estrogen therapy with letrozole in 05/2016. She istolerating well, we'll continue for a total of 5-7 years  -continue  surveillance  3. History of PE (01/06/2014) -likely secondary to malignancy in the setting of family history for stroke (and possible stroke history in patient as well). -Continue Xarelto. Due to her metastatic colon cancer, I previously recommended her to continue indefinitely.  4. Uterine Fibroids  -Negative endometrial biopsy. Following with Gynecology.  5. Kidney lesion (Right) -No hypermetabolic right kidney lesion on the pet scan -Repeat abdominal MRI 05/29/2015 showed unchanged right renal lesion, favored to represent a complex/septate cyst. -Stable  6. Right flank pain, left Knee discomfort  -possibly related to her liver metastasis -I previously suggested she see a orthopedics for her knee issues.  -Left knee pain has improved but right flank pain is stable.   7. HTN -Continue Amlodipine, lopressor and HCTZ     8. Hypokalemia  -Potassium normalized -Continue oral K   9. Depression -Patient acknowledges that her depression has getting worse since the recent cancer progression - She also has low appetite, and insomnia - On mirtazapine 7.5 mg at bedtime, and titrate dose as needed.  10.  Transaminitis -Her lab today showed mild to moderate transaminitis, and slightly increased total bilirubin, not sure if it is related to her loss of or her liver metastasis -will repeat LFTs before she starts cycle 2 Lonsurf  Plan  -Lab, flush and f/u in 4 weeks  - I refilled acetaminophen-codeine and lidocaine-prilocaine  -will repeat lab before she start cycle 2 Lonsurf     No problem-specific Assessment & Plan notes found for this encounter.   No orders of the defined types were placed in this encounter.  All questions were answered. The patient knows to call the clinic with any problems, questions or concerns. No barriers to learning was detected. I spent 20 minutes counseling the patient face to face. The total time spent in the appointment was 25 minutes  and more than 50% was on counseling and review of test results  I, Manson Allan am acting as scribe for Dr. [name].  I have reviewed the above documentation for accuracy and completeness, and I agree with the above.     [name], MD 11/09/2018

## 2018-11-09 ENCOUNTER — Ambulatory Visit: Payer: BLUE CROSS/BLUE SHIELD | Admitting: Hematology

## 2018-11-09 ENCOUNTER — Telehealth: Payer: Self-pay | Admitting: Hematology

## 2018-11-09 ENCOUNTER — Inpatient Hospital Stay (HOSPITAL_BASED_OUTPATIENT_CLINIC_OR_DEPARTMENT_OTHER): Payer: BLUE CROSS/BLUE SHIELD | Admitting: Hematology

## 2018-11-09 ENCOUNTER — Telehealth: Payer: Self-pay

## 2018-11-09 ENCOUNTER — Inpatient Hospital Stay: Payer: BLUE CROSS/BLUE SHIELD

## 2018-11-09 ENCOUNTER — Other Ambulatory Visit: Payer: BLUE CROSS/BLUE SHIELD

## 2018-11-09 ENCOUNTER — Encounter: Payer: Self-pay | Admitting: Hematology

## 2018-11-09 VITALS — BP 131/88 | HR 67 | Temp 99.5°F | Resp 19 | Ht 66.0 in | Wt 282.1 lb

## 2018-11-09 DIAGNOSIS — Z95828 Presence of other vascular implants and grafts: Secondary | ICD-10-CM

## 2018-11-09 DIAGNOSIS — M545 Low back pain: Secondary | ICD-10-CM

## 2018-11-09 DIAGNOSIS — C187 Malignant neoplasm of sigmoid colon: Secondary | ICD-10-CM

## 2018-11-09 DIAGNOSIS — I10 Essential (primary) hypertension: Secondary | ICD-10-CM

## 2018-11-09 DIAGNOSIS — Z86711 Personal history of pulmonary embolism: Secondary | ICD-10-CM

## 2018-11-09 DIAGNOSIS — C787 Secondary malignant neoplasm of liver and intrahepatic bile duct: Secondary | ICD-10-CM | POA: Diagnosis not present

## 2018-11-09 DIAGNOSIS — C7800 Secondary malignant neoplasm of unspecified lung: Secondary | ICD-10-CM | POA: Diagnosis not present

## 2018-11-09 DIAGNOSIS — Z170 Estrogen receptor positive status [ER+]: Principal | ICD-10-CM

## 2018-11-09 DIAGNOSIS — R630 Anorexia: Secondary | ICD-10-CM

## 2018-11-09 DIAGNOSIS — R109 Unspecified abdominal pain: Secondary | ICD-10-CM

## 2018-11-09 DIAGNOSIS — C50511 Malignant neoplasm of lower-outer quadrant of right female breast: Secondary | ICD-10-CM | POA: Diagnosis not present

## 2018-11-09 DIAGNOSIS — G4700 Insomnia, unspecified: Secondary | ICD-10-CM

## 2018-11-09 DIAGNOSIS — R740 Nonspecific elevation of levels of transaminase and lactic acid dehydrogenase [LDH]: Secondary | ICD-10-CM

## 2018-11-09 DIAGNOSIS — K5900 Constipation, unspecified: Secondary | ICD-10-CM

## 2018-11-09 DIAGNOSIS — E876 Hypokalemia: Secondary | ICD-10-CM

## 2018-11-09 LAB — CBC WITH DIFFERENTIAL (CANCER CENTER ONLY)
ABS IMMATURE GRANULOCYTES: 0.02 10*3/uL (ref 0.00–0.07)
Basophils Absolute: 0 10*3/uL (ref 0.0–0.1)
Basophils Relative: 1 %
Eosinophils Absolute: 0.1 10*3/uL (ref 0.0–0.5)
Eosinophils Relative: 1 %
HCT: 38.4 % (ref 36.0–46.0)
Hemoglobin: 11.9 g/dL — ABNORMAL LOW (ref 12.0–15.0)
Immature Granulocytes: 1 %
Lymphocytes Relative: 33 %
Lymphs Abs: 1.3 10*3/uL (ref 0.7–4.0)
MCH: 29.1 pg (ref 26.0–34.0)
MCHC: 31 g/dL (ref 30.0–36.0)
MCV: 93.9 fL (ref 80.0–100.0)
Monocytes Absolute: 0.5 10*3/uL (ref 0.1–1.0)
Monocytes Relative: 13 %
Neutro Abs: 2.1 10*3/uL (ref 1.7–7.7)
Neutrophils Relative %: 51 %
Platelet Count: 314 10*3/uL (ref 150–400)
RBC: 4.09 MIL/uL (ref 3.87–5.11)
RDW: 19.2 % — ABNORMAL HIGH (ref 11.5–15.5)
WBC Count: 4.1 10*3/uL (ref 4.0–10.5)
nRBC: 0 % (ref 0.0–0.2)

## 2018-11-09 LAB — CMP (CANCER CENTER ONLY)
ALT: 202 U/L — ABNORMAL HIGH (ref 0–44)
AST: 177 U/L (ref 15–41)
Albumin: 2.8 g/dL — ABNORMAL LOW (ref 3.5–5.0)
Alkaline Phosphatase: 242 U/L — ABNORMAL HIGH (ref 38–126)
Anion gap: 12 (ref 5–15)
BUN: 7 mg/dL — ABNORMAL LOW (ref 8–23)
CO2: 24 mmol/L (ref 22–32)
CREATININE: 0.82 mg/dL (ref 0.44–1.00)
Calcium: 9.7 mg/dL (ref 8.9–10.3)
Chloride: 100 mmol/L (ref 98–111)
GFR, Est AFR Am: >60 mL/min (ref >60)
GFR, Estimated: >60 mL/min (ref >60)
Glucose, Bld: 176 mg/dL — ABNORMAL HIGH (ref 70–99)
Potassium: 3.7 mmol/L (ref 3.5–5.1)
SODIUM: 136 mmol/L (ref 135–145)
Total Bilirubin: 1.4 mg/dL — ABNORMAL HIGH (ref 0.3–1.2)
Total Protein: 8.1 g/dL (ref 6.5–8.1)

## 2018-11-09 MED ORDER — ACETAMINOPHEN-CODEINE #4 300-60 MG PO TABS: 1.0000 | ORAL_TABLET | ORAL | 0 refills | Status: AC | PRN

## 2018-11-09 MED ORDER — LIDOCAINE-PRILOCAINE 2.5-2.5 % EX CREA: 1.0000 "application " | TOPICAL_CREAM | CUTANEOUS | 2 refills | Status: DC | PRN

## 2018-11-09 MED ORDER — HEPARIN SOD (PORK) LOCK FLUSH 100 UNIT/ML IV SOLN
500.0000 [IU] | Freq: Once | INTRAVENOUS | Status: AC
  Administered 2018-11-09: 500 [IU] via INTRAVENOUS
  Filled 2018-11-09: qty 5

## 2018-11-09 MED ORDER — SODIUM CHLORIDE 0.9% FLUSH
10.0000 mL | INTRAVENOUS | Status: DC | PRN
  Administered 2018-11-09: 10 mL via INTRAVENOUS
  Filled 2018-11-09: qty 10

## 2018-11-09 NOTE — Telephone Encounter (Signed)
Scheduled appt per 02/25 los. ° °Printed and mailed calendar. °

## 2018-11-09 NOTE — Telephone Encounter (Signed)
Oral Oncology Patient Advocate Encounter  The patient is not able to pay her $900 premium with [employer]. She is currently uninsured. I explained to her that I could assist her with filling out a manufacturer assistance application and if approved she would get the medicine mailed to her home free of charge. She agreed to proceed with this and I will put this information in a separate encounter.  [place] Patient [place] Phone [phone] Fax [phone] 11/09/2018   12:18 PM

## 2018-11-09 NOTE — Telephone Encounter (Signed)
Oral Oncology Patient Advocate Encounter  The patient no longer has insurance due to not being able to afford the premium. I explained to the patient that I could help her apply for manufacturer assistance and if approved, the lonsurf would be mailed directly to her home free of charge. I have filled out a manufacturer assistance application with [employer], the patient came in and signed the application. I gave the patient my email and she will email me her most recent tax return.  This encounter will be updated until final determination.  [place] Patient [place] Phone [phone] Fax [phone] 11/09/2018   12:20 PM

## 2018-11-10 ENCOUNTER — Telehealth: Payer: Self-pay | Admitting: General Practice

## 2018-11-10 ENCOUNTER — Telehealth: Payer: Self-pay

## 2018-11-10 NOTE — Telephone Encounter (Signed)
[place] CSW Progress Notes  Referral received from MD to help patient determine is she is eligible for Medicaid.  Has lost insurance coverage due to high premium cost.  Tried patient at spouse and her phone, VM not set up on spouse phone, left generic VM on home phone.  Edwyna Shell, LCSW Clinical Social Worker Phone:  [phone]

## 2018-11-10 NOTE — Telephone Encounter (Signed)
Oral Oncology Patient Advocate Encounter  I received the tax return and faxed the completed application today.  This encounter will be updated until final determination.  [place] Patient [place] Phone [phone] Fax [phone] 11/10/2018   10:37 AM

## 2018-11-10 NOTE — Telephone Encounter (Signed)
Left voice message for this patient regarding lab results, per Dr. Burr Medico we will want to get a repeat lab prior to her restarting Lonsurf.  Will let her know when.

## 2018-11-12 ENCOUNTER — Telehealth: Payer: Self-pay

## 2018-11-12 NOTE — Telephone Encounter (Signed)
Oral Oncology Patient Advocate Encounter  I followed up with [facility] this morning on the patients Lonsurf manufacturer assistance application. Jeanmarie Plant stated that they called the patient yesterday to go over the application and the patient told them that she would call them back when she found out how her blood work is and if she will be continuing the [employer].  This encounter will be updated until final determination.  [place] Patient [place] Phone [phone] Fax [phone] 11/12/2018   8:28 AM

## 2018-11-12 NOTE — Telephone Encounter (Signed)
Per Dr. Burr Medico accept the shipment of Lonsurf to get it on the way, we will recheck your labs next Wednesday or Thursday, someone from scheduling will call you.

## 2018-11-15 ENCOUNTER — Telehealth: Payer: Self-pay | Admitting: Hematology

## 2018-11-15 NOTE — Telephone Encounter (Signed)
Scheduled appt per 2/28 sch message - pt is aware of appt date and time

## 2018-11-15 NOTE — Telephone Encounter (Signed)
Oral Oncology Patient Advocate Encounter  I spoke to the patient this morning and she will call [employer] to finish the application process and schedule the shipment of her Lonsurf.    [place] Patient [place] Phone [phone] Fax [phone] 11/15/2018   10:11 AM

## 2018-11-16 NOTE — Telephone Encounter (Signed)
Oral Oncology Patient Advocate Encounter  [facility] manufacturer assistance program has approved the Lonsurf to be filled through them 11/16/18-09/15/19. Lonsurf will be filled at their contracted pharmacy which is [facility] and the phone number to call for refills is [phone]. Veronica Reyes will call the patient to schedule her first shipment of [place]. This will make the patients out of pocket cost $0.  I called the patient to give her the good news and had to leave a voicemail.  [place] Patient [place] Phone [phone] Fax [phone] 11/16/2018   4:14 PM

## 2018-11-17 ENCOUNTER — Other Ambulatory Visit: Payer: Self-pay | Admitting: *Deleted

## 2018-11-17 DIAGNOSIS — C187 Malignant neoplasm of sigmoid colon: Secondary | ICD-10-CM

## 2018-11-18 ENCOUNTER — Other Ambulatory Visit: Payer: Self-pay

## 2018-11-18 ENCOUNTER — Telehealth: Payer: Self-pay | Admitting: Pharmacist

## 2018-11-18 ENCOUNTER — Telehealth: Payer: Self-pay

## 2018-11-18 ENCOUNTER — Ambulatory Visit (HOSPITAL_COMMUNITY)
Admission: RE | Admit: 2018-11-18 | Discharge: 2018-11-18 | Disposition: A | Discharge status: Home / Self Care | Payer: BLUE CROSS/BLUE SHIELD | Source: Ambulatory Visit | Attending: Hematology | Admitting: Hematology

## 2018-11-18 ENCOUNTER — Inpatient Hospital Stay (HOSPITAL_BASED_OUTPATIENT_CLINIC_OR_DEPARTMENT_OTHER): Payer: BLUE CROSS/BLUE SHIELD | Admitting: Hematology

## 2018-11-18 ENCOUNTER — Inpatient Hospital Stay: Payer: BLUE CROSS/BLUE SHIELD | Attending: Hematology

## 2018-11-18 ENCOUNTER — Encounter: Payer: Self-pay | Admitting: Hematology

## 2018-11-18 VITALS — BP 127/92 | HR 97 | Temp 99.5°F | Resp 18

## 2018-11-18 DIAGNOSIS — C787 Secondary malignant neoplasm of liver and intrahepatic bile duct: Secondary | ICD-10-CM

## 2018-11-18 DIAGNOSIS — I10 Essential (primary) hypertension: Secondary | ICD-10-CM | POA: Diagnosis not present

## 2018-11-18 DIAGNOSIS — C7800 Secondary malignant neoplasm of unspecified lung: Secondary | ICD-10-CM | POA: Diagnosis not present

## 2018-11-18 DIAGNOSIS — C187 Malignant neoplasm of sigmoid colon: Secondary | ICD-10-CM

## 2018-11-18 DIAGNOSIS — E876 Hypokalemia: Secondary | ICD-10-CM | POA: Insufficient documentation

## 2018-11-18 DIAGNOSIS — C50511 Malignant neoplasm of lower-outer quadrant of right female breast: Secondary | ICD-10-CM | POA: Diagnosis not present

## 2018-11-18 DIAGNOSIS — K5900 Constipation, unspecified: Secondary | ICD-10-CM | POA: Insufficient documentation

## 2018-11-18 DIAGNOSIS — Z86711 Personal history of pulmonary embolism: Secondary | ICD-10-CM | POA: Diagnosis not present

## 2018-11-18 DIAGNOSIS — F329 Major depressive disorder, single episode, unspecified: Secondary | ICD-10-CM | POA: Diagnosis not present

## 2018-11-18 DIAGNOSIS — R109 Unspecified abdominal pain: Secondary | ICD-10-CM | POA: Diagnosis not present

## 2018-11-18 DIAGNOSIS — R740 Nonspecific elevation of levels of transaminase and lactic acid dehydrogenase [LDH]: Secondary | ICD-10-CM | POA: Diagnosis not present

## 2018-11-18 LAB — COMPREHENSIVE METABOLIC PANEL
ALT: 160 U/L — AB (ref 0–44)
AST: 154 U/L — ABNORMAL HIGH (ref 15–41)
Albumin: 2.9 g/dL — ABNORMAL LOW (ref 3.5–5.0)
Alkaline Phosphatase: 300 U/L — ABNORMAL HIGH (ref 38–126)
Anion gap: 11 (ref 5–15)
BUN: 10 mg/dL (ref 8–23)
CO2: 25 mmol/L (ref 22–32)
CREATININE: 0.79 mg/dL (ref 0.44–1.00)
Calcium: 9.9 mg/dL (ref 8.9–10.3)
Chloride: 97 mmol/L — ABNORMAL LOW (ref 98–111)
GFR calc non Af Amer: >60 mL/min (ref >60)
Glucose, Bld: 139 mg/dL — ABNORMAL HIGH (ref 70–99)
Potassium: 3.6 mmol/L (ref 3.5–5.1)
Sodium: 133 mmol/L — ABNORMAL LOW (ref 135–145)
Total Bilirubin: 10.4 mg/dL — ABNORMAL HIGH (ref 0.3–1.2)
Total Protein: 8.7 g/dL — ABNORMAL HIGH (ref 6.5–8.1)

## 2018-11-18 LAB — CBC WITH DIFFERENTIAL (CANCER CENTER ONLY)
Abs Immature Granulocytes: 0.06 10*3/uL (ref 0.00–0.07)
Basophils Absolute: 0 10*3/uL (ref 0.0–0.1)
Basophils Relative: 0 %
EOS PCT: 1 %
Eosinophils Absolute: 0.1 10*3/uL (ref 0.0–0.5)
HCT: 39.2 % (ref 36.0–46.0)
Hemoglobin: 12.7 g/dL (ref 12.0–15.0)
Immature Granulocytes: 1 %
Lymphocytes Relative: 13 %
Lymphs Abs: 1.2 10*3/uL (ref 0.7–4.0)
MCH: 29.8 pg (ref 26.0–34.0)
MCHC: 32.4 g/dL (ref 30.0–36.0)
MCV: 92 fL (ref 80.0–100.0)
Monocytes Absolute: 0.7 10*3/uL (ref 0.1–1.0)
Monocytes Relative: 8 %
Neutro Abs: 7 10*3/uL (ref 1.7–7.7)
Neutrophils Relative %: 77 %
Platelet Count: 257 10*3/uL (ref 150–400)
RBC: 4.26 MIL/uL (ref 3.87–5.11)
RDW: 21.7 % — ABNORMAL HIGH (ref 11.5–15.5)
WBC: 9.1 10*3/uL (ref 4.0–10.5)
nRBC: 0 % (ref 0.0–0.2)

## 2018-11-18 LAB — CEA (IN HOUSE-CHCC): CEA (CHCC-In House): 304.46 ng/mL — ABNORMAL HIGH (ref 0.00–5.00)

## 2018-11-18 MED ORDER — OXYCODONE HCL 5 MG PO TABS: 5.0000 mg | ORAL_TABLET | ORAL | 0 refills | Status: DC | PRN

## 2018-11-18 MED ORDER — TRIFLURIDINE-TIPIRACIL 20-8.19 MG PO TABS: ORAL_TABLET | ORAL | 2 refills | Status: AC

## 2018-11-18 NOTE — Telephone Encounter (Signed)
Oral Oncology Pharmacist Encounter  Received call from [facility] that they had not yet received Lonsurf prescription for patient. Patient has just been approved to receive medication through manufacturer assistance program at $0 out of pocket cost and [facility] is specialty pharmacy.  Prescription for Lonsurf 20mg  tablets, take 4 tablets (80mg  trifluridine component) by mouth 2 times daily, within 60 min of eating a meal, on days 1-5 & 8-12 of each 28-day cycle, quantity#80, refills=2, has been e-scribed to [facility] in [place], [place], PharmD, BCPS, BCOP  11/18/2018 10:52 AM Oral Oncology Clinic [phone]

## 2018-11-18 NOTE — Progress Notes (Signed)
[place]   [phone] [phone]   Clinic Follow up Note   Patient Care Team: [name], MD as PCP - General (Internal Medicine) Leighton Ruff, MD as Consulting Physician (General Surgery) Carol Ada, MD as Consulting Physician (Gastroenterology) Concha Norway, MD (Inactive) as Consulting Physician (Internal Medicine) Emily Filbert, MD as Consulting Physician (Obstetrics and Gynecology) Stark Klein, MD as Consulting Physician (General Surgery) [name], MD as Consulting Physician (Hematology) Jonnie Finner, RN as Registered Nurse 11/18/2018  CHIEF COMPLAINT: Hyperbilirubinemia   SUMMARY OF ONCOLOGIC HISTORY: Oncology History   Breast cancer of lower-outer quadrant of right female breast [facility])   Staging form: Breast, AJCC 7th Edition     Clinical stage from 02/01/2015: Stage IA (T1a, N0, M0) - Signed by [name], MD on 06/04/2015     Pathologic stage from 11/10/2015: Stage IA (T1b, N0, M0) - Signed by [name], MD on 12/08/2015 Cancer of sigmoid colon [facility])   Staging form: Colon and Rectum, AJCC 7th Edition     Clinical: Stage I (T2, N0, M0) - Unsigned     Breast cancer of lower-outer quadrant of right female breast ([place])   2006 Cancer Diagnosis    Left breast DCIS diagnosed in 2006 and 2010, status post lumpectomy.    01/07/2014 Initial Diagnosis    Breast cancer    01/18/2015 Mammogram    51m mass in lower outer quadrant of right breast     02/01/2015 Initial Biopsy    (R) breast mass biopsy showed invasive ductal carcinoma & DCIS, grade 1-2.     02/01/2015 Receptors her2    ER 90%+, PR 10%+, Ki67 10%, HER2 negative (ratio 1.09)     11/07/2015 Surgery    (R) breast lumpectomy and sentinel lymph node biopsy ([name]    11/07/2015 Pathology Results    (R) breast lumpectomy showed invasive ductal carcinoma, grade 2, 1 cm, low-grade DCIS, negative margins, 3 sentinel lymph nodes were negative. LVI (-). HER2 repeated and  remains negative (ratio 1.29).      11/07/2015 Pathologic Stage    pT1b,pN0: Stage IA     11/07/2015 Oncotype testing    RS 22, which predicts a year risk of distant recurrence with tamoxifen alone 14% (intermediate-risk). Adjuvant chemo was not offered    02/21/2016 - 04/10/2016 Radiation Therapy    Adjuvant breast radiation. ([name]). Right breast: 50.4 Gy in 28 fractions.  Right breast boost: 12 Gy in 6 fractions.    05/2016 -  Anti-estrogen oral therapy    Femara (Letrozole) 2.5 mg daily.     10/17/2016 Mammogram    MM DIAG BREAST TOMO BILATERAL 10/17/2016 FINDINGS: There are bilateral lumpectomy changes in the upper central left breast and upper central right breast. No mass, nonsurgical distortion, or suspicious microcalcification is identified in either breast to suggest malignancy. Mammographic images were processed with CAD. IMPRESSION: No evidence of malignancy in either breast. Lumpectomy changes bilaterally. RECOMMENDATION: Diagnostic mammogram is suggested in 1 year. (Code:[id number])    12/16/2017 Mammogram    IMPRESSION: No mammographic evidence of malignancy in either breast, status post bilateral mastectomies. RECOMMENDATION: Diagnostic mammogram is suggested in 1 year.      [place]moid [place]   03/04/2014 Initial Diagnosis    Cancer of sigmoid colon    03/09/2014 Pathology Results    G1 invasive adenocarcinoma, no lymph-Vascular invasion or Peri-neural invasion: Absent. MMR normal, MSI stable.     03/09/2014 Surgery    partial colectomy,  negative margins.     11/10/2014 Relapse/Recurrence    biopsy confirmed oligo liver recurrence     11/10/2014 Imaging    PET showed two small ares of hypermetabolism in the right lobe of the liver, no other distant metastasis.     11/30/2014 Imaging    abdomen MRI showed a new enhancing lesion which corresponds to an area of abnormal hyper metabolism on recent PET-CT. Stable enlarged portal caval lymph node.     01/02/2015  Pathology Results    Liver biopsy showed metastatic adenocarcinoma, IHC (+) for CK20, CDX-2, (-) for TTF-1 and ER    01/02/2015 Miscellaneous    liver biopsy KRAS mutation (+)     02/14/2015 Imaging    CT showed:  the small metastatic right hepatic lobe liver lesion is not identified for certain on this examination. No new lesions. Stable small cysts in segment 4 a.     03/05/2015 - 04/16/2015 Chemotherapy    CAPEOX (capecitabine 2500 mg twice daily Day 1-14, oxaliplatin 1 30 mg/m on day 1, every 21 days, S/P 3 cycles     08/24/2015 Procedure    CT-guided oligo liver metastasis microwave ablation by Dr. [name]    06/17/2016 Imaging    CT of the chest/abd/pelvis with contrast 1. Several new small pulmonary nodules worrisome for metastatic disease. 2. New large area of probable infiltrating recurrent tumor in the right hepatic lobe surrounding the prior ablation site. 3. Small supraclavicular lymph nodes.  Attention on future studies. 4. Stable surgical changes involving both breasts. No breast masses are identified. 5. Stable bilateral adrenal gland nodules. 6. Stable upper abdominal and right-sided retroperitoneal lymph nodes.    07/08/2016 PET scan    1. Large mass in the right lobe of the liver demonstrates diffuse hypermetabolism, compatible with recurrent metastatic disease in the liver 2. Multiple small pulmonary nodules are very similar to the recent Chest CT 06/17/16.  3. No other findings of metastatic disease elsewhere in the neck, chest, abdomen, or pelvis. 4. There is some low-level hypermetabolic activity adjacent to the surgical clips in the lateral aspect of the right breast at the site of prior lumpectomy.     07/29/2016 Pathology Results    Liver biopsy confirmed metastatic colon cancer     07/31/2016 - 07/31/2017 Chemotherapy    Chemotherapy FOLFIRI and Avastin (from cycle 2) and Leucovorin (added from cycle 8) every 2 weeks, started on 07/31/2016, multiple delay per  pt's request; chemo break from 5/30-6/21/18; decreased leucovorin to 2000 mg/m2, irinotecan to 149m/m2, 5-fu 20053mm2 from 03/05/17, due to poor tolerance. Started chemo break since 07/31/17.     10/23/2016 Imaging    CT ABDOMEN PELVIS W CONTRAST 10/24/16 IMPRESSION: 1. The numerous tiny bilateral pulmonary nodules seen on the previous imaging exams are no longer evident, suggesting response of therapy. 2. Large ill-defined irregular right hepatic lesion measures smaller on today's study. 3. Slight increase in size of hepatoduodenal ligament lymphadenopathy. 4. Abdominal Aortic Atherosclerois (ICD10-170.0)    01/05/2017 Imaging    CT CAP IMPRESSION: 1. Left lower lobe pulmonary nodule and hepatic metastatic disease are stable. 2. Hepatoduodenal ligament lymph node is stable. 3. Aortic atherosclerosis (ICD10-170.0). Coronary artery calcification. 4. Enlarged pulmonary arteries, indicative of pulmonary arterial hypertension. 5. Small bilateral adrenal nodules are unchanged.    05/09/2017 Imaging    CT CAP IMPRESSION: 1. Stable appearance left lower lobe pulmonary nodule with an apparent new tiny posterior right upper lobe pulmonary nodule. 2. Dominant ill-defined right hepatic mass measures  smaller on today's study. 3. Slight decrease in the hepatoduodenal ligament lymph node. 4. Aortic Atherosclerois (ICD10-170.0)    05/09/2017 Imaging    CT CAP 05/09/17 IMPRESSION: 1. Stable appearance left lower lobe pulmonary nodule with an apparent new tiny posterior right upper lobe pulmonary nodule. Continued attention on follow-up recommended. 2. Dominant ill-defined right hepatic mass measures smaller on today's study. 3. Slight decrease in the hepatoduodenal ligament lymph node. 4.  Aortic Atherosclerois (ICD10-170.0)     08/11/2017 Imaging    CT CAP W Contrast 08/11/17 IMPRESSION: 1. Essentially stable appearance of small pulmonary nodules, hypodense mass laterally in the right hepatic  lobe, and mild portacaval adenopathy. No new or progressive lesions. 2. Other imaging findings of potential clinical significance: Aortic Atherosclerosis (ICD10-I70.0). Lower lumbar impingement due to spondylosis and degenerative disc disease. Uterine fibroids. Bilateral renal cysts. Coronary atherosclerosis. Mild mitral valve calcification. Mild cardiomegaly.      09/24/2017 - 12/04/2017 Chemotherapy    Maintenance Xeloda 2500 mg twice daily days 1 through 14 of each cycle of treatment and Avastin given every 3 weeks starting on 09/24/2017. Due to slight progression this was stopped on 12/04/17     12/02/2017 Imaging    CT CAP W Contrast 12/02/17 IMPRESSION: Chest Impression: 1. Interval increase in size of bilateral small pulmonary nodules. 2. No mediastinal adenopathy  Abdomen / Pelvis Impression: 1. Dominant lesion in the RIGHT hepatic lobe is similar; however there are 2 adjacent low-density lesions in the RIGHT hepatic lobe which are not appreciated on comparison exam and concerning recurrent hepatic metastasis. 2. Interval increase in size periaortic retroperitoneal lymph nodes consistent with recurrent metastatic adenopathy.    01/08/2018 - 08/24/2018 Chemotherapy    Due to slight disease progression she restarted on Irinotecan plus Avastin every 2 weeks on 01/08/18. Added 5-Fu bolus and Leucovorin on 04/13/18. Stopped on 08/24/18 due to disease progression.     04/09/2018 Imaging    04/09/2018 CT CAP W Contrast IMPRESSION: 1. Slight interval increase in size as well as development of new low-attenuation lesions within the liver. 2. Grossly unchanged bilateral pulmonary nodules. 3. Similar-appearing retroperitoneal adenopathy. 4. Small amount of gas within the endometrium of the uterus, nonspecific in etiology. Recommend clinical correlation for the possibility of infectious symptoms. Consider further evaluation with pelvic ultrasound as indicated.     06/07/2018 Imaging     06/07/2018 CT CAP IMPRESSION: Chest Impression:  1. Stable bilateral pulmonary nodules. 2. Single new pulmonary nodule in the RIGHT lower lobe with ill-defined borders. Recommend short-term follow-up CT (6-8 weeks) as this may represent a focus of pulmonary infection.  Abdomen / Pelvis Impression:  1. Stable hepatic metastasis in the RIGHT hepatic lobe. No new lesions. 2. Small periaortic retroperitoneal lymph nodes are unchanged. 3. Sigmoid anastomosis without local recurrence. 4. Mild nodular contour of liver could indicate early cirrhosis.    09/21/2018 Imaging    CT CAP IMPRESSION: 1. Interval progression of metastatic disease. Numerous bilateral pulmonary metastases have increased in size. Liver metastases have increased in size and number. New mild left axillary and central mesenteric lymphadenopathy. Increased bilateral adrenal gland thickening suspicious for growing small adrenal metastases. 2. Mild cardiomegaly. Stable dilated main pulmonary artery suggesting chronic pulmonary arterial hypertension. 3. Chronic findings include: Aortic Atherosclerosis (ICD10-I70.0). Stable ectatic 4.2 cm ascending thoracic aorta. Three-vessel coronary atherosclerosis. Enlarged myomatous uterus.      10/11/2018 -  Chemotherapy    Third-line Lonsurf M-F 2 weeks on, 2 weeks off starting 10/11/18  CURRENT THERAPY  1. Letrozole 2.5 mg once daily, started in September 2017 2.Third-line Lonsurf M-F 2 weeks on, 2 weeks off, started 10/11/18, stopped after first cycle due to worsening LFTs   INTERVAL HISTORY: Veronica Reyes came in today for lab work.  Due to her significant hyperbilirubinemia, I ordered an urgent abdominal ultrasound which was done this afternoon.  I saw her in my office after the ultrasound.  She has noticed dark urine since last week, no significant pruritus.  Her abdominal pain is stable, she takes Tylenol No. 4 4 to 5 tablets a day.  No fever or chills.   Her appetite overall has been low, she has not eaten much since last night, but does feel hungry and will eat dinner when she gets home.   Review of system was positive for fatigue, abdominal pain, constipation, and mid low back pain.   REVIEW OF SYSTEMS:   Constitutional: Denies fevers, chills or abnormal weight loss, (+) low energy, (+) low appetite  Eyes: Denies blurriness of vision Ears, nose, mouth, throat, and face: Denies mucositis or sore throat Respiratory: Denies cough, dyspnea or wheezes Cardiovascular: Denies palpitation, chest discomfort or lower extremity swelling Gastrointestinal:  Denies nausea, heartburn, (+) constipation  Skin: Denies abnormal skin rashes, (+) mild lower abdominal pain  Lymphatics: Denies new lymphadenopathy or easy bruising Neurological:Denies numbness, tingling or new weaknesses Behavioral/Psych: Mood is stable, no new changes  Musculoskeletal: (+) mild lower back pain  All other systems were reviewed with the patient and are negative.  MEDICAL HISTORY:  Past Medical History:  Diagnosis Date  . Anxiety   . Breast CA ([place])    radiation and surgery-lt.  . Cancer ([place])    left breast cancer x2  . Hypertension   . Liver lesion   . Personal history of chemotherapy   . Personal history of radiation therapy 2017  . Pulmonary embolism ([place])    "blood clot in lungs" -dx. 4'15 with CT Chest. On Xarelto  . Radiation 01/22/10-03/12/10   left whole breast 4500 cGy, upper aspect boosted to 6120 cGy  . Radiation 02/21/16 - 04/10/16   right breast 50.4 Gy, right breast boost 12 Gy  . Shortness of breath    sob with exertion. dx. with Pulmonary emboli 4'15 ,tx. Xarelto,Lovenox used for [employer].    SURGICAL HISTORY: Past Surgical History:  Procedure Laterality Date  . BREAST BIOPSY Left 2010   malignant  . BREAST BIOPSY Right 01/2015   malignant  . BREAST LUMPECTOMY Left 2006  . BREAST LUMPECTOMY Left 2010  . BREAST LUMPECTOMY Right 11/07/2015  . BREAST  LUMPECTOMY WITH RADIOACTIVE SEED AND SENTINEL LYMPH NODE BIOPSY Right 11/07/2015   Procedure: BREAST LUMPECTOMY WITH RADIOACTIVE SEED AND SENTINEL LYMPH NODE BIOPSY;  Surgeon: Stark Klein, MD;  Location: [facility];  Service: General;  Laterality: Right;  . BREAST SURGERY     '11- Left breast lumpectomy  . CESAREAN SECTION    . CHOLECYSTECTOMY     Laparoscopic 20 yrs ago  . COLON SURGERY  03-09-14   partial colectomy for cancer  . IR GENERIC HISTORICAL  07/29/2016   IR FLUORO GUIDE PORT INSERTION RIGHT 07/29/2016 Sandi Mariscal, MD [facility]  . IR GENERIC HISTORICAL  07/29/2016   IR US GUIDE VASC ACCESS RIGHT 07/29/2016 Sandi Mariscal, MD [facility]  . IR GENERIC HISTORICAL  07/29/2016   IR US GUIDE BX ASP/DRAIN 07/29/2016 [facility]  . PARTIAL COLECTOMY N/A 03/09/2014   Procedure: LAPAROSCOPIC ASSISTED  PARTIAL COLECTOMY, splenic flexure mobilization;  Surgeon: Leighton Ruff, MD;  Location: [facility];  Service: General;  Laterality: N/A;  . RADIOFREQUENCY ABLATION Right 08/24/2015   Procedure: MICROWAVE ABLATION RIGHT LIVER LOBE ;  Surgeon: Corrie Mckusick, DO;  Location: [facility];  Service: Anesthesiology;  Laterality: Right;    I have reviewed the social history and family history with the patient and they are unchanged from previous note.  ALLERGIES:  is allergic to tramadol.  MEDICATIONS:  Current Outpatient Medications  Medication Sig Dispense Refill  . acetaminophen-codeine (TYLENOL #4) 300-60 MG tablet Take 1 tablet by mouth every 4 (four) hours as needed. for pain 90 tablet 0  . amLODipine (NORVASC) 5 MG tablet TAKE 1 TABLET BY MOUTH EVERY DAY 30 tablet 3  . ferrous sulfate 325 (65 FE) MG tablet Take 325 mg by mouth daily with breakfast. Pt takes  4 times per week.    . hydrochlorothiazide (HYDRODIURIL) 25 MG tablet TAKE 1 TABLET BY MOUTH in THE MORNING 30 tablet 3  . letrozole (FEMARA) 2.5 MG tablet TAKE 1 TABLET BY MOUTH EVERY DAY 30 tablet 3  . lidocaine-prilocaine  (EMLA) cream Apply 1 application topically as needed. 30 g 2  . LORazepam (ATIVAN) 0.5 MG tablet Take 1 tablet (0.5 mg total) by mouth every 8 (eight) hours. 30 tablet 0  . metoprolol tartrate (LOPRESSOR) 50 MG tablet Take 1 tablet (50 mg total) by mouth 2 (two) times daily. 60 tablet 1  . metoprolol tartrate (LOPRESSOR) 50 MG tablet TAKE 1 TABLET BY MOUTH 2 TIMES DAILY 60 tablet 2  . mirtazapine (REMERON) 7.5 MG tablet Take 1 tablet (7.5 mg total) by mouth at bedtime. 30 tablet 1  . ondansetron (ZOFRAN) 8 MG tablet Take 1 tablet (8 mg total) by mouth 2 (two) times daily as needed for refractory nausea / vomiting. Start on day 3 after chemotherapy. 30 tablet 1  . oxyCODONE (OXY IR/ROXICODONE) 5 MG immediate release tablet Take 1 tablet (5 mg total) by mouth every 4 (four) hours as needed for severe pain. 30 tablet 0  . potassium chloride SA (K-DUR,KLOR-CON) 20 MEQ tablet TAKE 1 TABLET BY MOUTH 2 TIMES DAILY 60 tablet 2  . prochlorperazine (COMPAZINE) 10 MG tablet Take 1 tablet (10 mg total) by mouth every 6 (six) hours as needed (NAUSEA). 30 tablet 2  . promethazine (PHENERGAN) 25 MG tablet Take 1 tablet (25 mg total) by mouth every 6 (six) hours as needed for nausea or vomiting. 30 tablet 2  . trifluridine-tipiracil (LONSURF) 20-8.19 MG tablet Take 4 tablets (26m trifluridine) by mouth twice daily, within 1 hr after AM & PM meals on days 1-5 & 8-12 of each 28 day cycle 80 tablet 2  . XARELTO 20 MG TABS tablet TAKE 1 TABLET BY MOUTH EVERY DAY 30 tablet 3  . zolpidem (AMBIEN) 5 MG tablet Take 1 tablet (5 mg total) by mouth at bedtime as needed for sleep. 20 tablet 0   No current facility-administered medications for this visit.     PHYSICAL EXAMINATION: ECOG PERFORMANCE STATUS: 2 - Symptomatic, <50% confined to bed  Vitals:   11/18/18 1637  BP: (!) 127/92  Pulse: 97  Resp: 18  Temp: 99.5 F (37.5 C)  SpO2: 100%   There were no vitals filed for this visit.  GENERAL:alert, no distress  and comfortable SKIN: skin color, texture, turgor are normal, no rashes or significant lesions EYES: normal, Conjunctiva are pink and non-injected, sclera clear OROPHARYNX:no exudate, no erythema and  lips, buccal mucosa, and tongue normal  NECK: supple, thyroid normal size, non-tender, without nodularity LYMPH:  no palpable lymphadenopathy in the cervical, axillary or inguinal LUNGS: clear to auscultation and percussion with normal breathing effort HEART: regular rate & rhythm and no murmurs and no lower extremity edema ABDOMEN:abdomen soft, non-tender, (+)  Sluggish bowel sounds Musculoskeletal:no cyanosis of digits and no clubbing  NEURO: alert & oriented x 3 with fluent speech, no focal motor/sensory deficits  LABORATORY DATA:  I have reviewed the data as listed CBC Latest Ref Rng & Units 11/18/2018 11/09/2018 10/26/2018  WBC 4.0 - 10.5 K/uL 9.1 4.1 5.7  Hemoglobin 12.0 - 15.0 g/dL 12.7 11.9(L) 11.1(L)  Hematocrit 36.0 - 46.0 % 39.2 38.4 34.8(L)  Platelets 150 - 400 K/uL 257 314 287     CMP Latest Ref Rng & Units 11/18/2018 11/09/2018 10/26/2018  Glucose 70 - 99 mg/dL 139(H) 176(H) 152(H)  BUN 8 - 23 mg/dL 10 7(L) 12  Creatinine 0.44 - 1.00 mg/dL 0.79 0.82 0.82  Sodium 135 - 145 mmol/L 133(L) 136 135  Potassium 3.5 - 5.1 mmol/L 3.6 3.7 3.7  Chloride 98 - 111 mmol/L 97(L) 100 101  CO2 22 - 32 mmol/L '25 24 24  ' Calcium 8.9 - 10.3 mg/dL 9.9 9.7 10.0  Total Protein 6.5 - 8.1 g/dL 8.7(H) 8.1 8.1  Total Bilirubin 0.3 - 1.2 mg/dL 10.4(H) 1.4(H) 0.7  Alkaline Phos 38 - 126 U/L 300(H) 242(H) 112  AST 15 - 41 U/L 154(H) 177(HH) 38  ALT 0 - 44 U/L 160(H) 202(H) 21      RADIOGRAPHIC STUDIES: I have personally reviewed the radiological images as listed and agreed with the findings in the report. US Abdomen Limited Ruq  Result Date: 11/18/2018 CLINICAL DATA:  Limited RIGHT upper quadrant ultrasound, elevated bilirubin > 100 EXAM: ULTRASOUND ABDOMEN LIMITED RIGHT UPPER QUADRANT COMPARISON:  CT  abdomen and pelvis 09/21/2018 FINDINGS: Gallbladder: Surgically absent Common bile duct: Diameter: 6 mm diameter, normal Liver: Increased hepatic echogenicity. Mild intrahepatic biliary dilatation. Hypoechoic macrolobulated mass within superior RIGHT lobe liver containing calcifications, lesion measuring 5.3 x 4.2 x 2.9 cm. Additional large irregular mass containing calcifications versus multiple confluent metastases involving the posterior RIGHT lobe of the liver, 12.5 x 7.9 x 7.6 cm. Small LEFT lobe liver mass 2.9 x 2.7 x 2.4 cm. Suspect these lesions may be slightly larger than on the prior CT exam though direct comparison is difficult. Additional smaller and less well-defined metastases are seen. Small amount of supra hepatic space perihepatic fluid. Portal vein is patent on color Doppler imaging with normal direction of blood flow towards the liver. Difficult to establish patency of the hepatic veins on this exam. IMPRESSION: Multiple hepatic metastases as above, suspect slightly increased in sizes since previous exam. Post cholecystectomy. Mild intrahepatic biliary dilatation. Electronically Signed   By: Lavonia Dana M.D.   On: 11/18/2018 16:31     ASSESSMENT & PLAN:  Veronica Reyes is a 63 y.o. female with history of   1.  Hyperbilirubinemia, secondary worsening liver metastasis -Her lab today showed total bilirubin 10.4, mild and moderate transaminitis.  She underwent abdominal ultrasound today, which showed worsening liver metastasis, largest 12.5 cm, mild intrahepatic dilatation, CBD was normal. -Unfortunately her hyperbilirubinemia is related to diffuse liver metastasis, no significant biliary obstruction, unfortunately no intervention at this point can improve her hyperbilirubinemia. -Due to her worsening liver function, she is not a candidate for further chemotherapy.  2. Colon Cancer, Stage I (pT2N0), MMR normal,  oligo recurrent disease in liver in 12/2014, KRAS mutation (+),  liver and lung recurrence in 06/2016 -She was initially diagnosed with sigmoid colon cancer in 02/2014. She was treated with partial colectomy.  -Unfortunately she had cancer recurrence to the liver in diagnosed in 12/2014. She was treated with CAPOX but only completed 3 cycles due to moderate side effects.  -She declined liver mass resection, and underwent liver ablation by interventional radiologist Dr. Earleen Newport in 08/2015.Since then she has progressed on maintenance Xeloda -Treatment wwith Irinotecan and Avastin every 2 weeks since 12/2017  -Her CT CAP from 09/21/18 showed progression with numerous b/l lung mets, liver mets and new left axillary and centralmesentericlymph nodes. Irinotecan/5-fu bolus/Avastin was stopped due to disease progression  -she has started 4th line oral chemo Lonsurf M-F 2 weeks on, 2 weeks off. She experienced severe fatigue, but recovered some.  -Unfortunately due to her rapid worsening of liver function, with total bilirubin 10.4, she is not a candidate for further chemotherapy -We discussed palliative care and hospice, with focus on her symptom management and comfort. -pt was quite shocked by the news, not ready to discuss more today. I recommend a f/u next week, and suggest bring her family with her on next visit -f/u next week   3. Abdominal pain -She is currently on Tylenol No. 4, takes 4 to 6 tablets a day -Due to her worsening liver function, I recommend her change pain medication to oxycodone -Called in oxycodone 5 mg every 4-6 hours as needed for pain -We discussed constipation management when escalating pain medication   Plan  -lab and [place] results reviewed  -No more chemo due to her worsening LFTs secondary to cancer progression  -f/u next week to continue discuss goal of care and hospice     No problem-specific Assessment & Plan notes found for this encounter.   No orders of the defined types were placed in this encounter.  I spent 20 minutes  counseling the patient face to face. The total time spent in the appointment was 25 minutes and more than 50% was on counseling and review of test results      [name], MD 11/18/2018

## 2018-11-18 NOTE — Telephone Encounter (Signed)
Spoke with patient regarding lab results, bilirubin very high, per Dr. Burr Medico she wants an US Abdominal today, scheduled for 3:30 pm this afternoon as this patient states she has not had anything to eat or drink since last night.  She was given the time and told to come over to the Cancer center for Dr. Burr Medico to speak with her after the ultrasound, patient verbalized an understanding.

## 2018-11-19 ENCOUNTER — Ambulatory Visit: Payer: BLUE CROSS/BLUE SHIELD | Admitting: Hematology

## 2018-11-19 ENCOUNTER — Telehealth: Payer: Self-pay | Admitting: Hematology

## 2018-11-19 NOTE — Telephone Encounter (Signed)
Scheduled appt per 3/6 sch message - pt is aware of apt date and time

## 2018-11-19 NOTE — Telephone Encounter (Signed)
Per 3/5 los appt already scheduled.

## 2018-11-23 ENCOUNTER — Inpatient Hospital Stay: Payer: BLUE CROSS/BLUE SHIELD

## 2018-11-23 ENCOUNTER — Telehealth: Payer: Self-pay | Admitting: *Deleted

## 2018-11-23 ENCOUNTER — Telehealth: Payer: Self-pay | Admitting: Hematology

## 2018-11-23 ENCOUNTER — Inpatient Hospital Stay: Payer: BLUE CROSS/BLUE SHIELD | Admitting: Hematology

## 2018-11-23 NOTE — Telephone Encounter (Signed)
Received vm message from pt stating that she can no make appt today & would like to r/s.  Message to Dr Burr Medico & to schedulers to r/s.

## 2018-11-23 NOTE — Telephone Encounter (Signed)
Called pt per 3/10 sch message - husband said he will have pt call to r./s

## 2018-11-23 NOTE — Telephone Encounter (Signed)
"  Veronica Reyes returning call to Dr. Burr Medico, [phone]).  I don't know who called."  Informed of scheduling request sent to reschedule appointment today.  Call lost with attempt to transfer.

## 2018-11-24 ENCOUNTER — Other Ambulatory Visit: Payer: Self-pay

## 2018-11-24 ENCOUNTER — Ambulatory Visit: Payer: BLUE CROSS/BLUE SHIELD | Admitting: Hematology

## 2018-11-24 ENCOUNTER — Other Ambulatory Visit: Payer: BLUE CROSS/BLUE SHIELD

## 2018-11-24 DIAGNOSIS — C187 Malignant neoplasm of sigmoid colon: Secondary | ICD-10-CM

## 2018-11-24 MED ORDER — LIDOCAINE-PRILOCAINE 2.5-2.5 % EX CREA: 1.0000 "application " | TOPICAL_CREAM | CUTANEOUS | 2 refills | Status: AC | PRN

## 2018-11-24 NOTE — Telephone Encounter (Signed)
Spoke with patient regarding her missed appointment on 3/10.  Stressed importance of her and her husband coming in to see Dr. Burr Medico to discuss her health status.  She agreed to come in on Friday 3/13 at 9:00 for labs/port flush and to see Dr. Burr Medico at 9:45.  Scheduling message was sent.

## 2018-11-24 NOTE — Progress Notes (Signed)
Veronica Reyes   [phone] [phone]   Clinic Follow up Note   Patient Care Team: [name], MD as PCP - General (Internal Medicine) Leighton Ruff, MD as Consulting Physician (General Surgery) Carol Ada, MD as Consulting Physician (Gastroenterology) Concha Norway, MD (Inactive) as Consulting Physician (Internal Medicine) Emily Filbert, MD as Consulting Physician (Obstetrics and Gynecology) Stark Klein, MD as Consulting Physician (General Surgery) [name], MD as Consulting Physician (Hematology) Jonnie Finner, RN as Registered Nurse  Date of Service:  11/26/2018  CHIEF COMPLAINT: F/u colon cancer and h/o of breast cancer   SUMMARY OF ONCOLOGIC HISTORY: Oncology History   Breast cancer of lower-outer quadrant of right female breast [facility])   Staging form: Breast, AJCC 7th Edition     Clinical stage from 02/01/2015: Stage IA (T1a, N0, M0) - Signed by [name], MD on 06/04/2015     Pathologic stage from 11/10/2015: Stage IA (T1b, N0, M0) - Signed by [name], MD on 12/08/2015 Cancer of sigmoid colon [facility])   Staging form: Colon and Rectum, AJCC 7th Edition     Clinical: Stage I (T2, N0, M0) - Unsigned     Breast cancer of lower-outer quadrant of right female breast ([place])   2006 Cancer Diagnosis    Left breast DCIS diagnosed in 2006 and 2010, status post lumpectomy.    01/07/2014 Initial Diagnosis    Breast cancer    01/18/2015 Mammogram    58m mass in lower outer quadrant of right breast     02/01/2015 Initial Biopsy    (R) breast mass biopsy showed invasive ductal carcinoma & DCIS, grade 1-2.     02/01/2015 Receptors her2    ER 90%+, PR 10%+, Ki67 10%, HER2 negative (ratio 1.09)     11/07/2015 Surgery    (R) breast lumpectomy and sentinel lymph node biopsy ([name]    11/07/2015 Pathology Results    (R) breast lumpectomy showed invasive ductal carcinoma, grade 2, 1 cm, low-grade DCIS, negative margins, 3 sentinel lymph nodes  were negative. LVI (-). HER2 repeated and remains negative (ratio 1.29).      11/07/2015 Pathologic Stage    pT1b,pN0: Stage IA     11/07/2015 Oncotype testing    RS 22, which predicts a year risk of distant recurrence with tamoxifen alone 14% (intermediate-risk). Adjuvant chemo was not offered    02/21/2016 - 04/10/2016 Radiation Therapy    Adjuvant breast radiation. ([name]). Right breast: 50.4 Gy in 28 fractions.  Right breast boost: 12 Gy in 6 fractions.    05/2016 - 11/26/2018 Anti-estrogen oral therapy    Femara (Letrozole) 2.5 mg daily.     10/17/2016 Mammogram    MM DIAG BREAST TOMO BILATERAL 10/17/2016 FINDINGS: There are bilateral lumpectomy changes in the upper central left breast and upper central right breast. No mass, nonsurgical distortion, or suspicious microcalcification is identified in either breast to suggest malignancy. Mammographic images were processed with CAD. IMPRESSION: No evidence of malignancy in either breast. Lumpectomy changes bilaterally. RECOMMENDATION: Diagnostic mammogram is suggested in 1 year. (Code:[id number])    12/16/2017 Mammogram    IMPRESSION: No mammographic evidence of malignancy in either breast, status post bilateral mastectomies. RECOMMENDATION: Diagnostic mammogram is suggested in 1 year.      [place]   03/04/2014 Initial Diagnosis    Cancer of sigmoid colon    03/09/2014 Pathology Results    G1 invasive adenocarcinoma, no lymph-Vascular invasion or Peri-neural invasion: Absent. MMR normal, MSI  stable.     03/09/2014 Surgery    partial colectomy, negative margins.     11/10/2014 Relapse/Recurrence    biopsy confirmed oligo liver recurrence     11/10/2014 Imaging    PET showed two small ares of hypermetabolism in the right lobe of the liver, no other distant metastasis.     11/30/2014 Imaging    abdomen MRI showed a new enhancing lesion which corresponds to an area of abnormal hyper metabolism on recent PET-CT. Stable  enlarged portal caval lymph node.     01/02/2015 Pathology Results    Liver biopsy showed metastatic adenocarcinoma, IHC (+) for CK20, CDX-2, (-) for TTF-1 and ER    01/02/2015 Miscellaneous    liver biopsy KRAS mutation (+)     02/14/2015 Imaging    CT showed:  the small metastatic right hepatic lobe liver lesion is not identified for certain on this examination. No new lesions. Stable small cysts in segment 4 a.     03/05/2015 - 04/16/2015 Chemotherapy    CAPEOX (capecitabine 2500 mg twice daily Day 1-14, oxaliplatin 1 30 mg/m on day 1, every 21 days, S/P 3 cycles     08/24/2015 Procedure    CT-guided oligo liver metastasis microwave ablation by Dr. [name]    06/17/2016 Imaging    CT of the chest/abd/pelvis with contrast 1. Several new small pulmonary nodules worrisome for metastatic disease. 2. New large area of probable infiltrating recurrent tumor in the right hepatic lobe surrounding the prior ablation site. 3. Small supraclavicular lymph nodes.  Attention on future studies. 4. Stable surgical changes involving both breasts. No breast masses are identified. 5. Stable bilateral adrenal gland nodules. 6. Stable upper abdominal and right-sided retroperitoneal lymph nodes.    07/08/2016 PET scan    1. Large mass in the right lobe of the liver demonstrates diffuse hypermetabolism, compatible with recurrent metastatic disease in the liver 2. Multiple small pulmonary nodules are very similar to the recent Chest CT 06/17/16.  3. No other findings of metastatic disease elsewhere in the neck, chest, abdomen, or pelvis. 4. There is some low-level hypermetabolic activity adjacent to the surgical clips in the lateral aspect of the right breast at the site of prior lumpectomy.     07/29/2016 Pathology Results    Liver biopsy confirmed metastatic colon cancer     07/31/2016 - 07/31/2017 Chemotherapy    Chemotherapy FOLFIRI and Avastin (from cycle 2) and Leucovorin (added from cycle 8) every 2  weeks, started on 07/31/2016, multiple delay per pt's request; chemo break from 5/30-6/21/18; decreased leucovorin to 2000 mg/m2, irinotecan to 120m/m2, 5-fu 20029mm2 from 03/05/17, due to poor tolerance. Started chemo break since 07/31/17.     10/23/2016 Imaging    CT ABDOMEN PELVIS W CONTRAST 10/24/16 IMPRESSION: 1. The numerous tiny bilateral pulmonary nodules seen on the previous imaging exams are no longer evident, suggesting response of therapy. 2. Large ill-defined irregular right hepatic lesion measures smaller on today's study. 3. Slight increase in size of hepatoduodenal ligament lymphadenopathy. 4. Abdominal Aortic Atherosclerois (ICD10-170.0)    01/05/2017 Imaging    CT CAP IMPRESSION: 1. Left lower lobe pulmonary nodule and hepatic metastatic disease are stable. 2. Hepatoduodenal ligament lymph node is stable. 3. Aortic atherosclerosis (ICD10-170.0). Coronary artery calcification. 4. Enlarged pulmonary arteries, indicative of pulmonary arterial hypertension. 5. Small bilateral adrenal nodules are unchanged.    05/09/2017 Imaging    CT CAP IMPRESSION: 1. Stable appearance left lower lobe pulmonary nodule with an apparent new tiny posterior  right upper lobe pulmonary nodule. 2. Dominant ill-defined right hepatic mass measures smaller on today's study. 3. Slight decrease in the hepatoduodenal ligament lymph node. 4. Aortic Atherosclerois (ICD10-170.0)    05/09/2017 Imaging    CT CAP 05/09/17 IMPRESSION: 1. Stable appearance left lower lobe pulmonary nodule with an apparent new tiny posterior right upper lobe pulmonary nodule. Continued attention on follow-up recommended. 2. Dominant ill-defined right hepatic mass measures smaller on today's study. 3. Slight decrease in the hepatoduodenal ligament lymph node. 4.  Aortic Atherosclerois (ICD10-170.0)     08/11/2017 Imaging    CT CAP W Contrast 08/11/17 IMPRESSION: 1. Essentially stable appearance of small pulmonary  nodules, hypodense mass laterally in the right hepatic lobe, and mild portacaval adenopathy. No new or progressive lesions. 2. Other imaging findings of potential clinical significance: Aortic Atherosclerosis (ICD10-I70.0). Lower lumbar impingement due to spondylosis and degenerative disc disease. Uterine fibroids. Bilateral renal cysts. Coronary atherosclerosis. Mild mitral valve calcification. Mild cardiomegaly.      09/24/2017 - 12/04/2017 Chemotherapy    Maintenance Xeloda 2500 mg twice daily days 1 through 14 of each cycle of treatment and Avastin given every 3 weeks starting on 09/24/2017. Due to slight progression this was stopped on 12/04/17     12/02/2017 Imaging    CT CAP W Contrast 12/02/17 IMPRESSION: Chest Impression: 1. Interval increase in size of bilateral small pulmonary nodules. 2. No mediastinal adenopathy  Abdomen / Pelvis Impression: 1. Dominant lesion in the RIGHT hepatic lobe is similar; however there are 2 adjacent low-density lesions in the RIGHT hepatic lobe which are not appreciated on comparison exam and concerning recurrent hepatic metastasis. 2. Interval increase in size periaortic retroperitoneal lymph nodes consistent with recurrent metastatic adenopathy.    01/08/2018 - 08/24/2018 Chemotherapy    Due to slight disease progression she restarted on Irinotecan plus Avastin every 2 weeks on 01/08/18. Added 5-Fu bolus and Leucovorin on 04/13/18. Stopped on 08/24/18 due to disease progression.     04/09/2018 Imaging    04/09/2018 CT CAP W Contrast IMPRESSION: 1. Slight interval increase in size as well as development of new low-attenuation lesions within the liver. 2. Grossly unchanged bilateral pulmonary nodules. 3. Similar-appearing retroperitoneal adenopathy. 4. Small amount of gas within the endometrium of the uterus, nonspecific in etiology. Recommend clinical correlation for the possibility of infectious symptoms. Consider further evaluation with pelvic  ultrasound as indicated.     06/07/2018 Imaging    06/07/2018 CT CAP IMPRESSION: Chest Impression:  1. Stable bilateral pulmonary nodules. 2. Single new pulmonary nodule in the RIGHT lower lobe with ill-defined borders. Recommend short-term follow-up CT (6-8 weeks) as this may represent a focus of pulmonary infection.  Abdomen / Pelvis Impression:  1. Stable hepatic metastasis in the RIGHT hepatic lobe. No new lesions. 2. Small periaortic retroperitoneal lymph nodes are unchanged. 3. Sigmoid anastomosis without local recurrence. 4. Mild nodular contour of liver could indicate early cirrhosis.    09/21/2018 Imaging    CT CAP IMPRESSION: 1. Interval progression of metastatic disease. Numerous bilateral pulmonary metastases have increased in size. Liver metastases have increased in size and number. New mild left axillary and central mesenteric lymphadenopathy. Increased bilateral adrenal gland thickening suspicious for growing small adrenal metastases. 2. Mild cardiomegaly. Stable dilated main pulmonary artery suggesting chronic pulmonary arterial hypertension. 3. Chronic findings include: Aortic Atherosclerosis (ICD10-I70.0). Stable ectatic 4.2 cm ascending thoracic aorta. Three-vessel coronary atherosclerosis. Enlarged myomatous uterus.      10/11/2018 - 11/18/2018 Chemotherapy    Third-line Frankey Poot M-F 2  weeks on, 2 weeks off starting 10/11/18.  stopped after first cycle due to worsening LFTs        11/18/2018 Imaging    US Abdomen Limited RUQ  FINDINGS: Liver:  Increased hepatic echogenicity. Mild intrahepatic biliary dilatation. Hypoechoic macrolobulated mass within superior RIGHT lobe liver containing calcifications, lesion measuring 5.3 x 4.2 x 2.9 cm. Additional large irregular mass containing calcifications versus multiple confluent metastases involving the posterior RIGHT lobe of the liver, 12.5 x 7.9 x 7.6 cm. Small LEFT lobe liver mass 2.9 x 2.7 x 2.4 cm.  Suspect these lesions may be slightly larger than on the prior CT exam though direct comparison is difficult. Additional smaller and less well-defined metastases are seen. Small amount of supra hepatic space perihepatic fluid. Portal vein is patent on color Doppler imaging with normal direction of blood flow towards the liver. Difficult to establish patency of the hepatic veins on this exam.  IMPRESSION: Multiple hepatic metastases as above, suspect slightly increased in sizes since previous exam.       CURRENT THERAPY:  Pending hospice home care   INTERVAL HISTORY:  GIBSON TELLERIA is here for a follow up. She presents to the clinic today with husband and family member. She notes she is significant pain. She is out of her oxycodone 58m q4hours which did not help her pain at all. She did not try 132m She has been taking 3 extra strength tylenol. She rather go back to tylenol #4. She has not tried morphine. Her pain is constant on her right flank.  She notes with bowel movements she has less yellow urine and she had to strain which lead to blood in stool.  She notes her husband is not being notified of things even though he is in her charts. She and her husband are emotional about her pain and prognosis.    REVIEW OF SYSTEMS:   Constitutional: Denies fevers, chills or abnormal weight loss Eyes: Denies blurriness of vision Ears, nose, mouth, throat, and face: Denies mucositis or sore throat Respiratory: Denies cough, dyspnea or wheezes Cardiovascular: Denies palpitation, chest discomfort or lower extremity swelling Gastrointestinal:  Denies nausea, heartburn or change in bowel habits (+) Right flank pain, uncontrolled  Skin: Denies abnormal skin rashes Lymphatics: Denies new lymphadenopathy or easy bruising Neurological:Denies numbness, tingling or new weaknesses Behavioral/Psych: Mood is stable, no new changes  All other systems were reviewed with the patient and are  negative.  MEDICAL HISTORY:  Past Medical History:  Diagnosis Date  . Anxiety   . [place]   radiation and surgery-lt.  . [place]   left breast cancer x2  . Hypertension   . Liver lesion   . Personal history of chemotherapy   . Personal history of radiation therapy 2017  . Pulmonary embolism ([facility]   "blood clot in lungs" -dx. 4'15 with CT Chest. On Xarelto  . Radiation 01/22/10-03/12/10   left whole breast 4500 cGy, upper aspect boosted to 6120 cGy  . Radiation 02/21/16 - 04/10/16   right breast 50.4 Gy, right breast boost 12 Gy  . Shortness of breath    sob with exertion. dx. with Pulmonary emboli 4'15 ,tx. Xarelto,Lovenox used for [employer]   SURGICAL HISTORY: Past Surgical History:  Procedure Laterality Date  . BREAST BIOPSY Left 2010   malignant  . BREAST BIOPSY Right 01/2015   malignant  . BREAST LUMPECTOMY Left 2006  . BREAST LUMPECTOMY Left 2010  . BREAST LUMPECTOMY Right  11/07/2015  . BREAST LUMPECTOMY WITH RADIOACTIVE SEED AND SENTINEL LYMPH NODE BIOPSY Right 11/07/2015   Procedure: BREAST LUMPECTOMY WITH RADIOACTIVE SEED AND SENTINEL LYMPH NODE BIOPSY;  Surgeon: Stark Klein, MD;  Location: [place];  Service: General;  Laterality: Right;  . BREAST SURGERY     '11- Left breast lumpectomy  . CESAREAN SECTION    . CHOLECYSTECTOMY     Laparoscopic 20 yrs ago  . COLON SURGERY  03-09-14   partial colectomy for cancer  . IR GENERIC HISTORICAL  07/29/2016   IR FLUORO GUIDE PORT INSERTION RIGHT 07/29/2016 Sandi Mariscal, MD [facility]  . IR GENERIC HISTORICAL  07/29/2016   IR US GUIDE VASC ACCESS RIGHT 07/29/2016 Sandi Mariscal, MD [facility]  . IR GENERIC HISTORICAL  07/29/2016   IR US GUIDE BX ASP/DRAIN 07/29/2016 [facility]  . PARTIAL COLECTOMY N/A 03/09/2014   Procedure: LAPAROSCOPIC ASSISTED PARTIAL COLECTOMY, splenic flexure mobilization;  Surgeon: Leighton Ruff, MD;  Location: [facility];  Service: General;  Laterality: N/A;  .  RADIOFREQUENCY ABLATION Right 08/24/2015   Procedure: MICROWAVE ABLATION RIGHT LIVER LOBE ;  Surgeon: Corrie Mckusick, DO;  Location: [facility];  Service: Anesthesiology;  Laterality: Right;    I have reviewed the social history and family history with the patient and they are unchanged from previous note.  ALLERGIES:  is allergic to tramadol.  MEDICATIONS:  Current Outpatient Medications  Medication Sig Dispense Refill  . acetaminophen-codeine (TYLENOL #4) 300-60 MG tablet Take 1 tablet by mouth every 4 (four) hours as needed. for pain 90 tablet 0  . amLODipine (NORVASC) 5 MG tablet TAKE 1 TABLET BY MOUTH EVERY DAY 30 tablet 3  . ferrous sulfate 325 (65 FE) MG tablet Take 325 mg by mouth daily with breakfast. Pt takes  4 times per week.    . hydrochlorothiazide (HYDRODIURIL) 25 MG tablet TAKE 1 TABLET BY MOUTH in THE MORNING 30 tablet 3  . letrozole (FEMARA) 2.5 MG tablet TAKE 1 TABLET BY MOUTH EVERY DAY 30 tablet 3  . lidocaine-prilocaine (EMLA) cream Apply 1 application topically as needed. 30 g 2  . LORazepam (ATIVAN) 0.5 MG tablet Take 1 tablet (0.5 mg total) by mouth every 8 (eight) hours. 30 tablet 0  . metoprolol tartrate (LOPRESSOR) 50 MG tablet Take 1 tablet (50 mg total) by mouth 2 (two) times daily. 60 tablet 1  . metoprolol tartrate (LOPRESSOR) 50 MG tablet TAKE 1 TABLET BY MOUTH 2 TIMES DAILY 60 tablet 2  . mirtazapine (REMERON) 7.5 MG tablet Take 1 tablet (7.5 mg total) by mouth at bedtime. 30 tablet 1  . ondansetron (ZOFRAN) 8 MG tablet Take 1 tablet (8 mg total) by mouth 2 (two) times daily as needed for refractory nausea / vomiting. Start on day 3 after chemotherapy. 30 tablet 1  . oxyCODONE (OXY IR/ROXICODONE) 5 MG immediate release tablet Take 1 tablet (5 mg total) by mouth every 4 (four) hours as needed for severe pain. 30 tablet 0  . potassium chloride SA (K-DUR,KLOR-CON) 20 MEQ tablet TAKE 1 TABLET BY MOUTH 2 TIMES DAILY 60 tablet 2  . prochlorperazine (COMPAZINE) 10 MG tablet  Take 1 tablet (10 mg total) by mouth every 6 (six) hours as needed (NAUSEA). 30 tablet 2  . promethazine (PHENERGAN) 25 MG tablet Take 1 tablet (25 mg total) by mouth every 6 (six) hours as needed for nausea or vomiting. 30 tablet 2  . trifluridine-tipiracil (LONSURF) 20-8.19 MG tablet Take 4 tablets (29m trifluridine) by mouth  twice daily, within 1 hr after AM & PM meals on days 1-5 & 8-12 of each 28 day cycle 80 tablet 2  . XARELTO 20 MG TABS tablet TAKE 1 TABLET BY MOUTH EVERY DAY 30 tablet 3  . zolpidem (AMBIEN) 5 MG tablet Take 1 tablet (5 mg total) by mouth at bedtime as needed for sleep. 20 tablet 0  . morphine (MS CONTIN) 15 MG 12 hr tablet Take 1 tablet (15 mg total) by mouth every 12 (twelve) hours. 60 tablet 0   No current facility-administered medications for this visit.     PHYSICAL EXAMINATION: ECOG PERFORMANCE STATUS: 4 - Bedbound  Vitals:   11/26/18 1140  BP: 134/89  Pulse: 80  Resp: 20  Temp: 98.9 F (37.2 C)  SpO2: 99%   Filed Weights   11/26/18 1140  Weight: 274 lb (124.3 kg)    GENERAL:alert, no distress and comfortable SKIN: skin color, texture, turgor are normal, no rashes or significant lesions EYES: normal, Conjunctiva and non-injected, sclera clear (+) Jaundice of eyes  OROPHARYNX:no exudate, no erythema and lips, buccal mucosa, and tongue normal  NECK: supple, thyroid normal size, non-tender, without nodularity LYMPH:  no palpable lymphadenopathy in the cervical, axillary or inguinal LUNGS: clear to auscultation and percussion with normal breathing effort HEART: regular rate & rhythm and no murmurs and no lower extremity edema ABDOMEN:abdomen soft, non-tender and normal bowel sounds Musculoskeletal:no cyanosis of digits and no clubbing  NEURO: alert & oriented x 3 with fluent speech, no focal motor/sensory deficits  LABORATORY DATA:  I have reviewed the data as listed CBC Latest Ref Rng & Units 11/26/2018 11/18/2018 11/09/2018  WBC 4.0 - 10.5 K/uL 10.4  9.1 4.1  Hemoglobin 12.0 - 15.0 g/dL 12.2 12.7 11.9(L)  Hematocrit 36.0 - 46.0 % 36.2 39.2 38.4  Platelets 150 - 400 K/uL 475(H) 257 314     CMP Latest Ref Rng & Units 11/26/2018 11/18/2018 11/09/2018  Glucose 70 - 99 mg/dL 116(H) 139(H) 176(H)  BUN 8 - 23 mg/dL 8 10 7(L)  Creatinine 0.44 - 1.00 mg/dL 0.74 0.79 0.82  Sodium 135 - 145 mmol/L 132(L) 133(L) 136  Potassium 3.5 - 5.1 mmol/L 3.4(L) 3.6 3.7  Chloride 98 - 111 mmol/L 98 97(L) 100  CO2 22 - 32 mmol/L '22 25 24  ' Calcium 8.9 - 10.3 mg/dL 10.0 9.9 9.7  Total Protein 6.5 - 8.1 g/dL 8.6(H) 8.7(H) 8.1  Total Bilirubin 0.3 - 1.2 mg/dL 15.8(HH) 10.4(H) 1.4(H)  Alkaline Phos 38 - 126 U/L 479(H) 300(H) 242(H)  AST 15 - 41 U/L 148(H) 154(H) 177(HH)  ALT 0 - 44 U/L 81(H) 160(H) 202(H)      RADIOGRAPHIC STUDIES: I have personally reviewed the radiological images as listed and agreed with the findings in the report. No results found.   ASSESSMENT & PLAN:  RENESSA WELLNITZ is a 63 y.o. female with   1. Colon Cancer, Stage I (pT2N0), MMR normal, oligo recurrent disease in liver in 12/2014, KRAS mutation (+), liver and lung recurrence in 06/2016 -She was initially diagnosed with sigmoid colon cancer in 02/2014. She was treated with partial colectomy.  -Unfortunately she had cancer recurrence to the liver in diagnosed in 12/2014. She was treated with CAPOX but only completed 3 cycles due to moderate side effects.  -She declined liver mass resection, and underwent liver ablation by interventional radiologist Dr. Earleen Newport in 08/2015.Since then she has progressed on maintenance Xeloda -Treatment with Irinotecan and Avantin every 2 weeks since 12/2017 but unfortunately  she progressed as seen on 09/2018 CT CAP.  -She has started 4th lineoral chemo Lonsurf M-F 2 weeks on, 2 weeks off. Unfortunately due to worsening LFT and poor liver function we discontinued chemotherapy last week.  -Unfortunately patient has declined rapidly in the past few  weeks, she has significant uncontrolled pain, and almost bedbound. -I recommend starting at home hospice care to help give better palliative care. She and her husband agree. I will send an urgent referral today. I also discussed if her symptoms gets worse, and requires more intensive care, she has the option option of [facility] for residential hospice care.  I anticipate her life expectancy will be a few weeks -Labs reviewed, CBC WNL except PLT at 475K, CMP shows K at 3.4, BG 116, Protein 8.6, albumin 2.3, stable  transaminitis and worsened hyperbilirubinemia at 15.8.  -I will follow up with her as needed and continue to monitor her care.    2. Right breast cancer, pT1bN0M0, stage Ia, ER+/PR+/HER2-, History of left Breast Cancer -She was diagnosed withLeftbreast DCISon 02/01/15. She is s/pleftlumpectomy in 2006 and 2011 by Dr. Margot Chimes, and radiation in 2011 by Dr. Sondra Come.  -She was diagnosed with right breast invasive ductal carcinoma and DCIS in 01/2015. She is s/p right breast lumpectomy and adjuvant radiation. -She started anti-estrogen therapy with letrozole in 05/2016. Due to poor liver function and overall decline, I will stop her letrozole.   3. History of PE (01/06/2014) -likely secondary to malignancy in the setting of family history for stroke (and possible stroke history in patient as well). -Continue Xarelto. Due to her metastatic colon cancer, I previously recommended her to continue indefinitely.  4. Uterine Fibroids  -Negative endometrial biopsy. Following with Gynecology.  5. Kidney lesion (Right) -No hypermetabolic right kidney lesion on the pet scan -Repeat abdominal MRI 05/29/2015 showed unchanged right renal lesion, favored to represent a complex/septate cyst. -Stableon 09/2018 scan   6. Right flank pain, left Knee discomfort, Constipation  -Left knee painhasimproved -She has oxycodone 64m which is not touching her pain. Given she is in significant pain in  clinic I will give oral oxycodone 10 mg today. (11/26/18).  -I will prescribe him lon acting MS Contin BID. She will continue Oxycodone 5-144mas needed for breakthrough pain.  -She has constipation. I discussed pain medication can exacerbate this. I discussed increasing Miralax up to 8 times a day. -Due to pain and constipation she is not eating adequately. I advised her to work on controlling pain and constipation so she can eat better. She understands.   7. HTN -Continue Amlodipine, lopressor and HCTZ -BP at 134/89 today (11/26/18)  8. Hypokalemia  -Potassium at 3.4 today (11/26/18) -Continue oral K   9. Depression -On mirtazapine 7.5 mg at bedtime, and titrate dose as needed.  10. Transaminitis and Hyperbilirubinemia, secondary worsening liver metastasis -She underwent abdominal ultrasound on 11/18/18, which showed worsening liver metastasis, largest 12.5 cm, mild intrahepatic dilatation, CBD was normal. -Unfortunately her hyperbilirubinemia is related to diffuse liver metastasis, no significant biliary obstruction, unfortunately no intervention at this point can improve her hyperbilirubinemia. -Due to her worsening liver function, she is not a candidate for further chemotherapy. -AST at 148, ALT at 81, Alk phos at 479, Bili at 15.8 today (11/26/18)  11. Goal of care discussion  -Pt agreed hospice today -We discussed CODE STATUS, I recommended DNR/DNI, however patient does not want to talk about today. -Patient understands to call hospice first if she develops more symptoms at home,  instead of going to hospital directly.   Plan -I prescribed MS Contin 39m q12h today, she will continue oxycodone 5 to 10 mg every 6 hours as needed -fine to stop Letrozole  -Send urgent hospice home care referral, I will be happy to remain to be her MD  -F/u as needed    No problem-specific Assessment & Plan notes found for this encounter.   No orders of the defined types were placed in  this encounter.  All questions were answered. The patient knows to call the clinic with any problems, questions or concerns. No barriers to learning was detected. I spent 25 minutes counseling the patient face to face. The total time spent in the appointment was 30 minutes and more than 50% was on counseling and review of test results     YTruitt Merle MD 11/26/2018   I, AJoslyn Devon am acting as scribe for YTruitt Merle MD.   I have reviewed the above documentation for accuracy and completeness, and I agree with the above.

## 2018-11-26 ENCOUNTER — Inpatient Hospital Stay: Payer: BLUE CROSS/BLUE SHIELD

## 2018-11-26 ENCOUNTER — Inpatient Hospital Stay: Payer: BLUE CROSS/BLUE SHIELD | Admitting: Hematology

## 2018-11-26 ENCOUNTER — Other Ambulatory Visit: Payer: Self-pay

## 2018-11-26 ENCOUNTER — Encounter: Payer: Self-pay | Admitting: Hematology

## 2018-11-26 VITALS — BP 134/89 | HR 80 | Temp 98.9°F | Resp 20 | Ht 66.0 in | Wt 274.0 lb

## 2018-11-26 DIAGNOSIS — F329 Major depressive disorder, single episode, unspecified: Secondary | ICD-10-CM

## 2018-11-26 DIAGNOSIS — C7800 Secondary malignant neoplasm of unspecified lung: Secondary | ICD-10-CM | POA: Diagnosis not present

## 2018-11-26 DIAGNOSIS — Z7189 Other specified counseling: Secondary | ICD-10-CM

## 2018-11-26 DIAGNOSIS — C787 Secondary malignant neoplasm of liver and intrahepatic bile duct: Secondary | ICD-10-CM

## 2018-11-26 DIAGNOSIS — Z95828 Presence of other vascular implants and grafts: Secondary | ICD-10-CM

## 2018-11-26 DIAGNOSIS — Z86711 Personal history of pulmonary embolism: Secondary | ICD-10-CM

## 2018-11-26 DIAGNOSIS — C187 Malignant neoplasm of sigmoid colon: Secondary | ICD-10-CM

## 2018-11-26 DIAGNOSIS — I10 Essential (primary) hypertension: Secondary | ICD-10-CM

## 2018-11-26 DIAGNOSIS — Z170 Estrogen receptor positive status [ER+]: Principal | ICD-10-CM

## 2018-11-26 DIAGNOSIS — E876 Hypokalemia: Secondary | ICD-10-CM

## 2018-11-26 DIAGNOSIS — K5900 Constipation, unspecified: Secondary | ICD-10-CM

## 2018-11-26 DIAGNOSIS — C50511 Malignant neoplasm of lower-outer quadrant of right female breast: Secondary | ICD-10-CM

## 2018-11-26 DIAGNOSIS — R740 Nonspecific elevation of levels of transaminase and lactic acid dehydrogenase [LDH]: Secondary | ICD-10-CM

## 2018-11-26 DIAGNOSIS — R109 Unspecified abdominal pain: Secondary | ICD-10-CM

## 2018-11-26 LAB — CMP (CANCER CENTER ONLY)
ALBUMIN: 2.3 g/dL — AB (ref 3.5–5.0)
ALT: 81 U/L — ABNORMAL HIGH (ref 0–44)
AST: 148 U/L — ABNORMAL HIGH (ref 15–41)
Alkaline Phosphatase: 479 U/L — ABNORMAL HIGH (ref 38–126)
Anion gap: 12 (ref 5–15)
BUN: 8 mg/dL (ref 8–23)
CALCIUM: 10 mg/dL (ref 8.9–10.3)
CO2: 22 mmol/L (ref 22–32)
Chloride: 98 mmol/L (ref 98–111)
Creatinine: 0.74 mg/dL (ref 0.44–1.00)
GFR, Est AFR Am: >60 mL/min (ref >60)
GFR, Estimated: >60 mL/min (ref >60)
Glucose, Bld: 116 mg/dL — ABNORMAL HIGH (ref 70–99)
Potassium: 3.4 mmol/L — ABNORMAL LOW (ref 3.5–5.1)
Sodium: 132 mmol/L — ABNORMAL LOW (ref 135–145)
Total Bilirubin: 15.8 mg/dL (ref 0.3–1.2)
Total Protein: 8.6 g/dL — ABNORMAL HIGH (ref 6.5–8.1)

## 2018-11-26 LAB — CBC WITH DIFFERENTIAL (CANCER CENTER ONLY)
Abs Immature Granulocytes: 0.05 10*3/uL (ref 0.00–0.07)
Basophils Absolute: 0 10*3/uL (ref 0.0–0.1)
Basophils Relative: 0 %
EOS PCT: 1 %
Eosinophils Absolute: 0.1 10*3/uL (ref 0.0–0.5)
HEMATOCRIT: 36.2 % (ref 36.0–46.0)
HEMOGLOBIN: 12.2 g/dL (ref 12.0–15.0)
Immature Granulocytes: 1 %
Lymphocytes Relative: 11 %
Lymphs Abs: 1.2 10*3/uL (ref 0.7–4.0)
MCH: 30.1 pg (ref 26.0–34.0)
MCHC: 33.7 g/dL (ref 30.0–36.0)
MCV: 89.4 fL (ref 80.0–100.0)
MONO ABS: 0.6 10*3/uL (ref 0.1–1.0)
Monocytes Relative: 6 %
Neutro Abs: 8.4 10*3/uL — ABNORMAL HIGH (ref 1.7–7.7)
Neutrophils Relative %: 81 %
Platelet Count: 475 10*3/uL — ABNORMAL HIGH (ref 150–400)
RBC: 4.05 MIL/uL (ref 3.87–5.11)
RDW: 24.9 % — ABNORMAL HIGH (ref 11.5–15.5)
WBC Count: 10.4 10*3/uL (ref 4.0–10.5)
nRBC: 0 % (ref 0.0–0.2)

## 2018-11-26 MED ORDER — SODIUM CHLORIDE 0.9% FLUSH
10.0000 mL | INTRAVENOUS | Status: DC | PRN
  Administered 2018-11-26: 10 mL via INTRAVENOUS
  Filled 2018-11-26: qty 10

## 2018-11-26 MED ORDER — OXYCODONE-ACETAMINOPHEN 5-325 MG PO TABS
2.0000 | ORAL_TABLET | Freq: Once | ORAL | Status: AC
  Administered 2018-11-26: 2 via ORAL

## 2018-11-26 MED ORDER — HEPARIN SOD (PORK) LOCK FLUSH 100 UNIT/ML IV SOLN
500.0000 [IU] | Freq: Once | INTRAVENOUS | Status: AC | PRN
  Administered 2018-11-26: 500 [IU] via INTRAVENOUS
  Filled 2018-11-26: qty 5

## 2018-11-26 MED ORDER — OXYCODONE-ACETAMINOPHEN 5-325 MG PO TABS
ORAL_TABLET | ORAL | Status: AC
  Filled 2018-11-26: qty 2

## 2018-11-26 MED ORDER — MORPHINE SULFATE ER 15 MG PO TBCR: 15.0000 mg | EXTENDED_RELEASE_TABLET | Freq: Two times a day (BID) | ORAL | 0 refills | Status: DC

## 2018-11-26 NOTE — Progress Notes (Signed)
Phoned in urgent referral for [facility], spoke with Jenn in their office, they will make contact with the patient this afternoon.

## 2018-12-01 ENCOUNTER — Telehealth: Payer: Self-pay

## 2018-12-01 NOTE — Telephone Encounter (Signed)
Faxed signed orders back to Hospice, sent to [facility] for scanning to chart.  

## 2018-12-02 ENCOUNTER — Other Ambulatory Visit: Payer: Self-pay | Admitting: Hematology

## 2018-12-02 ENCOUNTER — Telehealth: Payer: Self-pay

## 2018-12-02 MED ORDER — OXYCODONE HCL 10 MG PO TABS: 10.0000 mg | ORAL_TABLET | ORAL | 0 refills | Status: AC | PRN

## 2018-12-02 MED ORDER — MORPHINE SULFATE ER 30 MG PO TBCR: 30.0000 mg | EXTENDED_RELEASE_TABLET | Freq: Two times a day (BID) | ORAL | 0 refills | Status: AC

## 2018-12-02 NOTE — Telephone Encounter (Signed)
Called Hospice RN back informed her Dr. Burr Medico increasing MS Contin to 30 mg BID and sending in script for Oxycodone 10 mg one every 4  To her pharmacy.  She will call the patient and let her know.

## 2018-12-02 NOTE — Telephone Encounter (Signed)
Hospice RN calls stating patient had called her stating having severe pain.  She is taking MS Contin 15 mg - not one but sometimes 2 every 12 hours (filled 3/13) along with Oxycodone 5 mg (taking 2 not one) every 4 hours this was filled 3/5. She only has 4 of the Oxycodone left.  [facility] is recommending either increase the MS Contin to 30 mg TID (NOT BID) or change her to Methadone 5 mg TID.  [phone]

## 2018-12-08 ENCOUNTER — Ambulatory Visit: Payer: BLUE CROSS/BLUE SHIELD | Admitting: Hematology

## 2018-12-08 ENCOUNTER — Other Ambulatory Visit: Payer: BLUE CROSS/BLUE SHIELD

## 2018-12-09 ENCOUNTER — Telehealth: Payer: Self-pay

## 2018-12-09 NOTE — Telephone Encounter (Signed)
Faxed back Hosp[facility] orders, sent to [facility] to scan

## 2018-12-22 ENCOUNTER — Encounter: Payer: Self-pay | Admitting: Hematology

## 2019-01-14 DEATH — deceased
